# Patient Record
Sex: Female | Born: 1935 | Race: White | Hispanic: No | Smoking: Never smoker
Health system: Southern US, Community
[De-identification: ages and names within clinical notes are randomized; demographics above are authoritative.]

## PROBLEM LIST (undated history)

## (undated) DIAGNOSIS — E119 Type 2 diabetes mellitus without complications: Secondary | ICD-10-CM

## (undated) DIAGNOSIS — L84 Corns and callosities: Secondary | ICD-10-CM

## (undated) DIAGNOSIS — M1990 Unspecified osteoarthritis, unspecified site: Secondary | ICD-10-CM

## (undated) DIAGNOSIS — I10 Essential (primary) hypertension: Secondary | ICD-10-CM

## (undated) DIAGNOSIS — T7840XA Allergy, unspecified, initial encounter: Secondary | ICD-10-CM

## (undated) DIAGNOSIS — F419 Anxiety disorder, unspecified: Secondary | ICD-10-CM

## (undated) DIAGNOSIS — F32A Depression, unspecified: Secondary | ICD-10-CM

## (undated) DIAGNOSIS — E785 Hyperlipidemia, unspecified: Secondary | ICD-10-CM

## (undated) DIAGNOSIS — F329 Major depressive disorder, single episode, unspecified: Secondary | ICD-10-CM

## (undated) HISTORY — DX: Type 2 diabetes mellitus without complications: E11.9

## (undated) HISTORY — DX: Corns and callosities: L84

## (undated) HISTORY — DX: Essential (primary) hypertension: I10

## (undated) HISTORY — DX: Depression, unspecified: F32.A

## (undated) HISTORY — PX: CHOLECYSTECTOMY: SHX55

## (undated) HISTORY — DX: Major depressive disorder, single episode, unspecified: F32.9

## (undated) HISTORY — DX: Allergy, unspecified, initial encounter: T78.40XA

## (undated) HISTORY — DX: Hyperlipidemia, unspecified: E78.5

## (undated) HISTORY — PX: ABDOMINAL HYSTERECTOMY: SHX81

## (undated) HISTORY — PX: SPLENECTOMY: SUR1306

## (undated) HISTORY — DX: Anxiety disorder, unspecified: F41.9

## (undated) HISTORY — DX: Unspecified osteoarthritis, unspecified site: M19.90

---

## 1998-04-14 ENCOUNTER — Other Ambulatory Visit: Admission: RE | Admit: 1998-04-14 | Discharge: 1998-04-14 | Payer: Self-pay | Admitting: Gynecology

## 1999-04-18 ENCOUNTER — Other Ambulatory Visit: Admission: RE | Admit: 1999-04-18 | Discharge: 1999-04-18 | Payer: Self-pay | Admitting: Gynecology

## 2000-05-10 ENCOUNTER — Other Ambulatory Visit: Admission: RE | Admit: 2000-05-10 | Discharge: 2000-05-10 | Payer: Self-pay | Admitting: Gynecology

## 2001-05-12 ENCOUNTER — Other Ambulatory Visit: Admission: RE | Admit: 2001-05-12 | Discharge: 2001-05-12 | Payer: Self-pay | Admitting: Gynecology

## 2001-05-28 ENCOUNTER — Encounter: Admission: RE | Admit: 2001-05-28 | Discharge: 2001-08-26 | Payer: Self-pay | Admitting: Gynecology

## 2001-10-20 ENCOUNTER — Ambulatory Visit (HOSPITAL_COMMUNITY): Admission: RE | Admit: 2001-10-20 | Discharge: 2001-10-20 | Payer: Self-pay | Admitting: Gynecology

## 2001-10-20 ENCOUNTER — Encounter: Payer: Self-pay | Admitting: Gynecology

## 2002-01-15 ENCOUNTER — Ambulatory Visit (HOSPITAL_COMMUNITY): Admission: RE | Admit: 2002-01-15 | Discharge: 2002-01-15 | Payer: Self-pay | Admitting: Gastroenterology

## 2002-05-15 ENCOUNTER — Other Ambulatory Visit: Admission: RE | Admit: 2002-05-15 | Discharge: 2002-05-15 | Payer: Self-pay | Admitting: Gynecology

## 2003-06-11 ENCOUNTER — Other Ambulatory Visit: Admission: RE | Admit: 2003-06-11 | Discharge: 2003-06-11 | Payer: Self-pay | Admitting: Gynecology

## 2005-01-11 ENCOUNTER — Ambulatory Visit: Payer: Self-pay | Admitting: Internal Medicine

## 2005-01-15 ENCOUNTER — Ambulatory Visit: Payer: Self-pay | Admitting: Internal Medicine

## 2005-02-02 ENCOUNTER — Ambulatory Visit: Payer: Self-pay | Admitting: Internal Medicine

## 2005-05-24 ENCOUNTER — Inpatient Hospital Stay (HOSPITAL_COMMUNITY): Admission: EM | Admit: 2005-05-24 | Discharge: 2005-05-26 | Payer: Self-pay | Admitting: Emergency Medicine

## 2005-05-24 ENCOUNTER — Ambulatory Visit: Payer: Self-pay | Admitting: Internal Medicine

## 2005-05-30 ENCOUNTER — Ambulatory Visit: Payer: Self-pay | Admitting: Internal Medicine

## 2005-06-12 ENCOUNTER — Encounter: Admission: RE | Admit: 2005-06-12 | Discharge: 2005-09-10 | Payer: Self-pay | Admitting: Internal Medicine

## 2005-06-14 ENCOUNTER — Ambulatory Visit: Payer: Self-pay | Admitting: Internal Medicine

## 2005-06-26 ENCOUNTER — Other Ambulatory Visit: Admission: RE | Admit: 2005-06-26 | Discharge: 2005-06-26 | Payer: Self-pay | Admitting: Gynecology

## 2005-07-30 ENCOUNTER — Ambulatory Visit: Payer: Self-pay | Admitting: Internal Medicine

## 2005-07-30 ENCOUNTER — Encounter: Admission: RE | Admit: 2005-07-30 | Discharge: 2005-07-30 | Payer: Self-pay | Admitting: Internal Medicine

## 2005-08-07 ENCOUNTER — Encounter: Admission: RE | Admit: 2005-08-07 | Discharge: 2005-08-07 | Payer: Self-pay | Admitting: Internal Medicine

## 2005-09-18 ENCOUNTER — Encounter: Admission: RE | Admit: 2005-09-18 | Discharge: 2005-12-17 | Payer: Self-pay | Admitting: Internal Medicine

## 2005-10-29 ENCOUNTER — Ambulatory Visit: Payer: Self-pay | Admitting: Internal Medicine

## 2005-11-13 ENCOUNTER — Ambulatory Visit: Payer: Self-pay

## 2005-11-13 ENCOUNTER — Encounter: Payer: Self-pay | Admitting: Cardiology

## 2006-01-29 ENCOUNTER — Ambulatory Visit: Payer: Self-pay | Admitting: Internal Medicine

## 2006-02-12 ENCOUNTER — Encounter: Admission: RE | Admit: 2006-02-12 | Discharge: 2006-05-13 | Payer: Self-pay | Admitting: Internal Medicine

## 2006-07-22 ENCOUNTER — Ambulatory Visit: Payer: Self-pay | Admitting: Internal Medicine

## 2006-10-17 ENCOUNTER — Ambulatory Visit: Payer: Self-pay | Admitting: Internal Medicine

## 2006-10-19 IMAGING — CR DG WRIST COMPLETE 3+V*L*
4 series · 4 of 4 positions shown · non-contrast
Comparison: none

CLINICAL DATA: Low back pain.  Pain in both legs.   Pain in the left wrist.   No injury.
 LUMBAR SPINE COMPLETE ? FIVE VIEWS:
 Five views of the lumbar spine were obtained.  There is lumbar scoliosis convex to the right.  Degenerative disk disease is noted at the L2-3, L3-4 and L4-5 levels where there is loss of disk space, sclerosis and spur formation.  No acute compression deformity is seen.  The SI joints appear normal.

[view not recorded (1 of 4)]
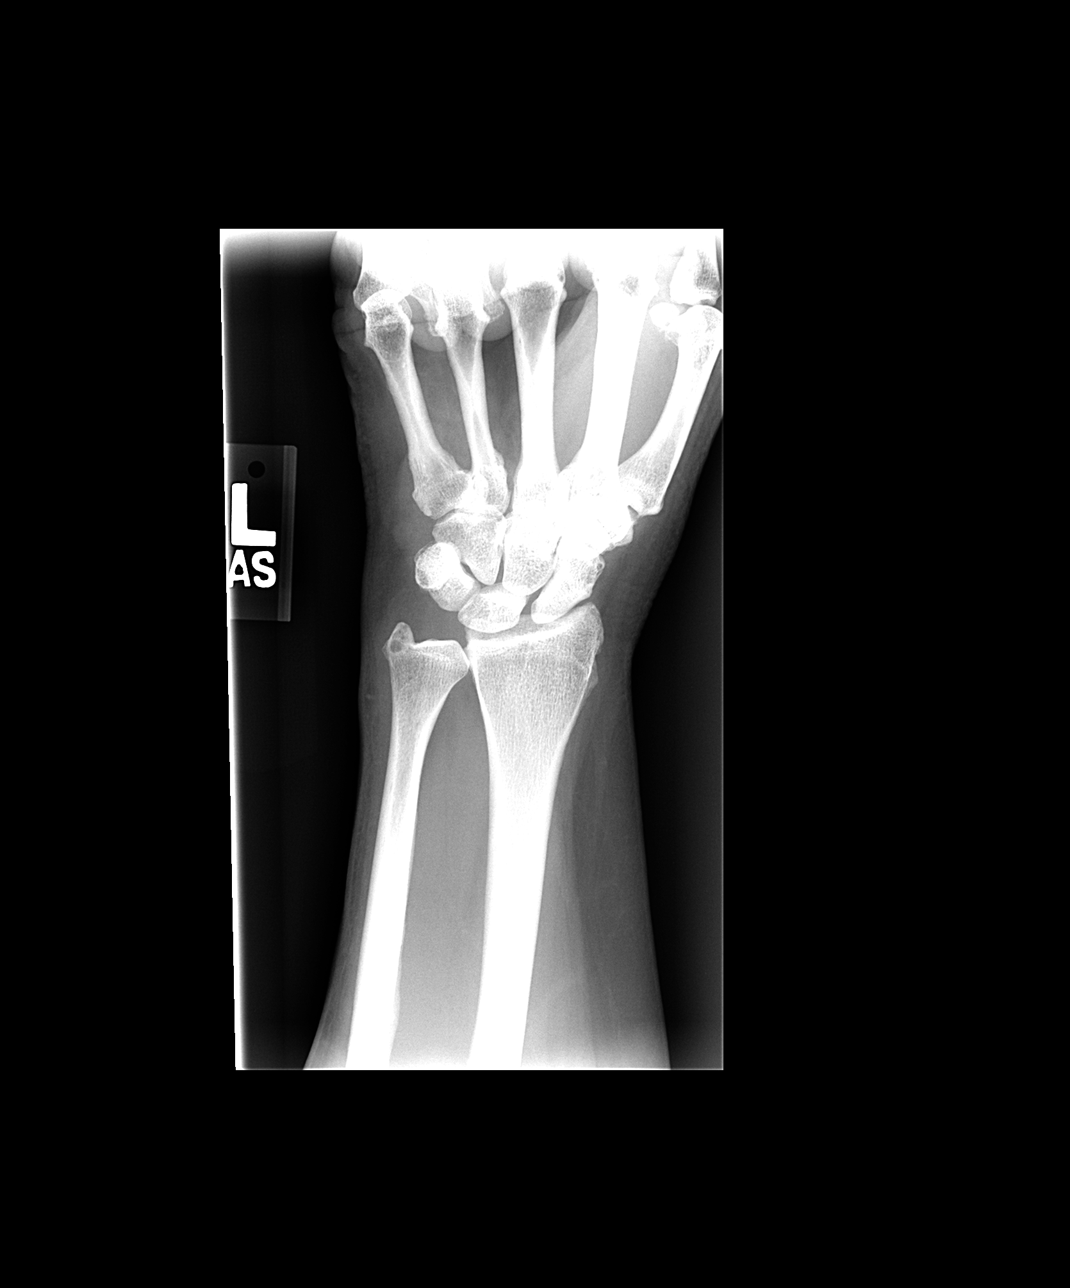

[view not recorded (2 of 4)]
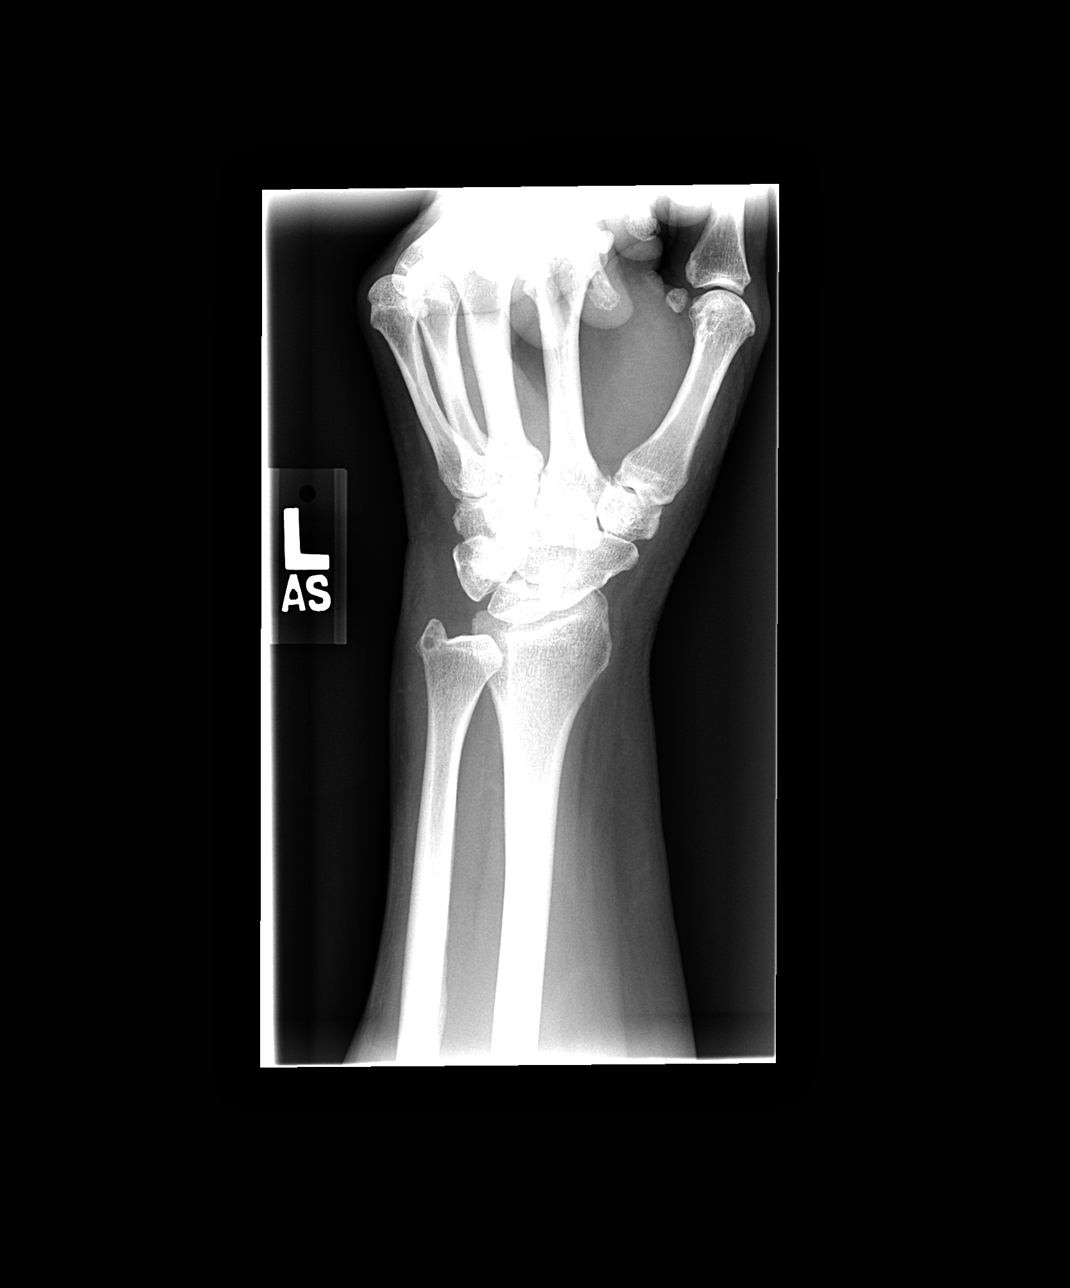

[view not recorded (3 of 4)]
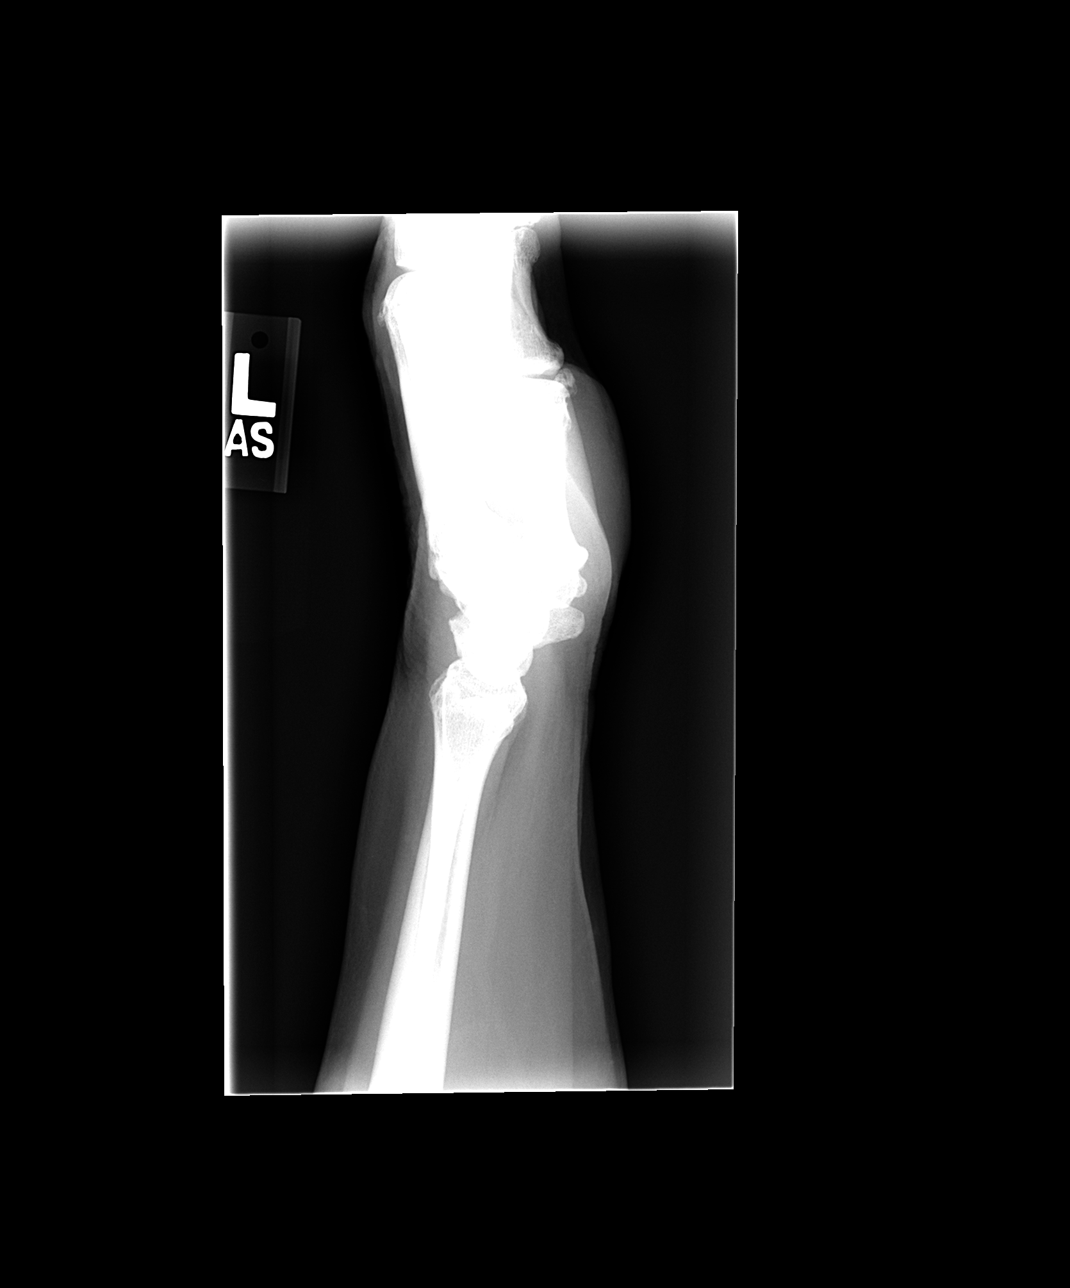

[view not recorded (4 of 4)]
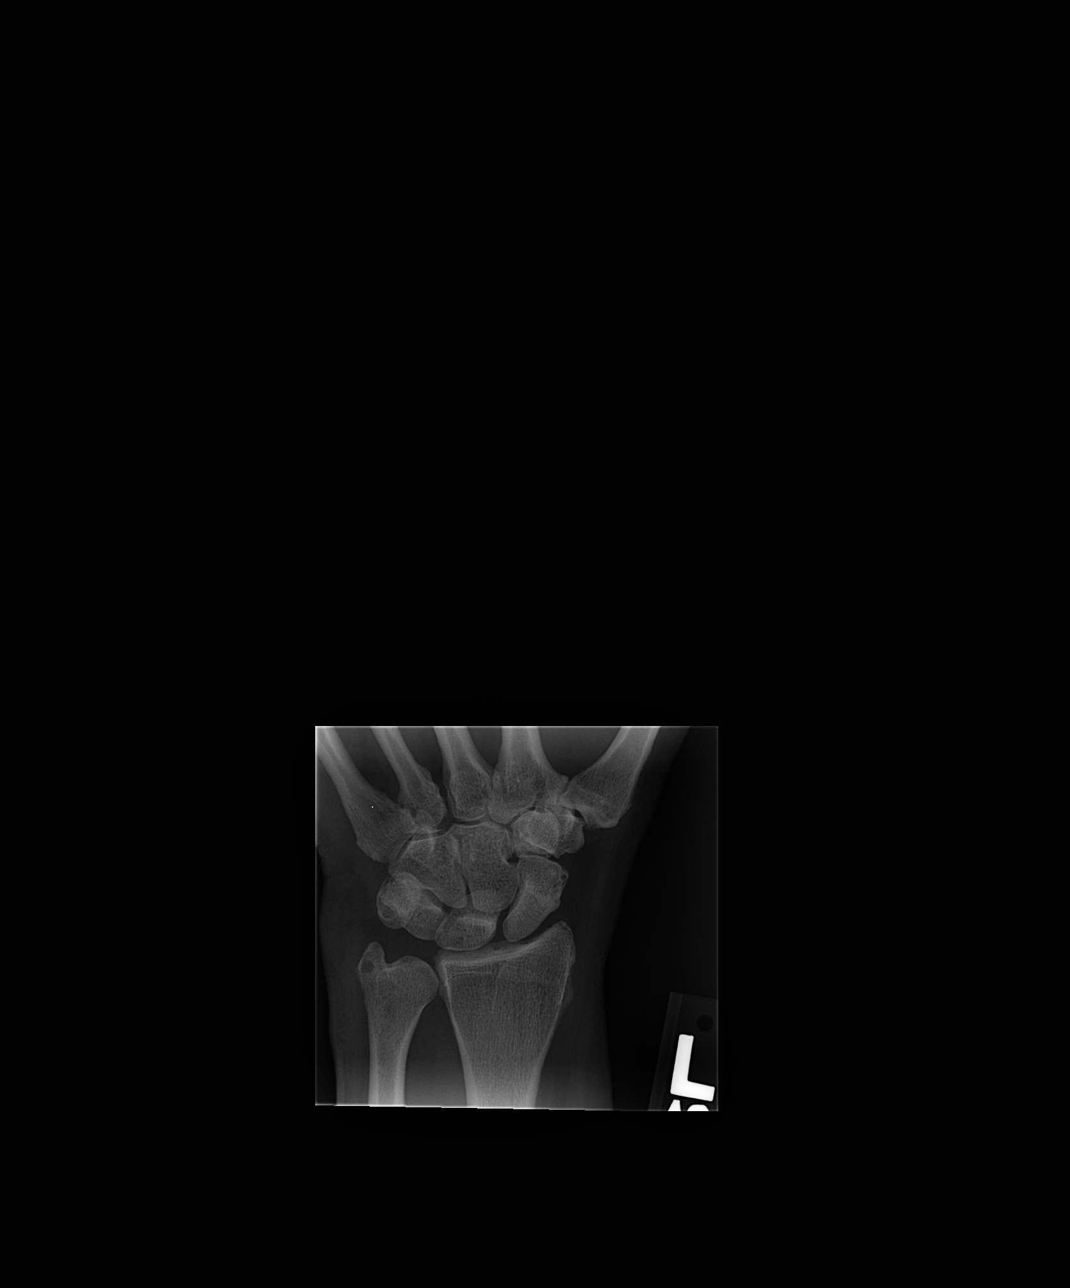

[4 of 4 positions shown; findings below may reference images not displayed]

IMPRESSION: Lumbar scoliosis convex to the right with degenerative disk disease as noted above.   No acute abnormality.
 LEFT WRIST COMPLETE ? FOUR VIEWS:
 Four views of the left wrist were obtained.  No acute abnormality is seen.  A subchondral cyst is noted in the ulnar styloid and in the distal aspect of the navicular.  Alignment is normal.
IMPRESSION: No acute abnormality.  Minimal degenerative change.

## 2007-01-31 ENCOUNTER — Ambulatory Visit: Payer: Self-pay | Admitting: Internal Medicine

## 2007-01-31 LAB — CONVERTED CEMR LAB
ALT: 20 units/L (ref 0–40)
BUN: 24 mg/dL — ABNORMAL HIGH (ref 6–23)
Cholesterol: 155 mg/dL (ref 0–200)
Creatinine, Ser: 1.1 mg/dL (ref 0.4–1.2)
Hgb A1c MFr Bld: 6 % (ref 4.6–6.0)
Microalb Creat Ratio: 5.3 mg/g (ref 0.0–30.0)
Total CHOL/HDL Ratio: 2.9

## 2007-05-19 DIAGNOSIS — L719 Rosacea, unspecified: Secondary | ICD-10-CM

## 2007-05-23 ENCOUNTER — Encounter: Payer: Self-pay | Admitting: Internal Medicine

## 2007-09-05 ENCOUNTER — Ambulatory Visit: Payer: Self-pay | Admitting: Internal Medicine

## 2007-09-05 DIAGNOSIS — E785 Hyperlipidemia, unspecified: Secondary | ICD-10-CM

## 2007-09-05 DIAGNOSIS — F32A Depression, unspecified: Secondary | ICD-10-CM | POA: Insufficient documentation

## 2007-09-05 DIAGNOSIS — E1169 Type 2 diabetes mellitus with other specified complication: Secondary | ICD-10-CM

## 2007-09-05 DIAGNOSIS — F329 Major depressive disorder, single episode, unspecified: Secondary | ICD-10-CM

## 2007-09-07 LAB — CONVERTED CEMR LAB
ALT: 20 units/L (ref 0–35)
AST: 21 units/L (ref 0–37)
BUN: 25 mg/dL — ABNORMAL HIGH (ref 6–23)
Cholesterol: 177 mg/dL (ref 0–200)
LDL Cholesterol: 98 mg/dL (ref 0–99)
Microalb, Ur: 0.2 mg/dL (ref 0.0–1.9)
Potassium: 4.8 meq/L (ref 3.5–5.1)
TSH: 1.16 microintl units/mL (ref 0.35–5.50)
Total CHOL/HDL Ratio: 3.3

## 2007-09-08 ENCOUNTER — Encounter (INDEPENDENT_AMBULATORY_CARE_PROVIDER_SITE_OTHER): Payer: Self-pay | Admitting: *Deleted

## 2007-11-24 ENCOUNTER — Encounter: Payer: Self-pay | Admitting: Internal Medicine

## 2007-12-03 ENCOUNTER — Encounter: Payer: Self-pay | Admitting: Internal Medicine

## 2008-03-19 ENCOUNTER — Ambulatory Visit: Payer: Self-pay | Admitting: Internal Medicine

## 2008-03-19 DIAGNOSIS — S43429A Sprain of unspecified rotator cuff capsule, initial encounter: Secondary | ICD-10-CM

## 2008-03-19 DIAGNOSIS — I10 Essential (primary) hypertension: Secondary | ICD-10-CM | POA: Insufficient documentation

## 2008-03-21 LAB — CONVERTED CEMR LAB
Albumin: 3.5 g/dL (ref 3.5–5.2)
Bilirubin, Direct: 0.1 mg/dL (ref 0.0–0.3)
Creatinine,U: 18.6 mg/dL
HDL: 55.3 mg/dL (ref >39.0)
Hgb A1c MFr Bld: 6.2 % — ABNORMAL HIGH (ref 4.6–6.0)
LDL Cholesterol: 67 mg/dL (ref 0–99)
Microalb, Ur: 0.4 mg/dL (ref 0.0–1.9)
Potassium: 5.3 meq/L — ABNORMAL HIGH (ref 3.5–5.1)
TSH: 1.69 microintl units/mL (ref 0.35–5.50)
Total Bilirubin: 1 mg/dL (ref 0.3–1.2)
Total Protein: 7.3 g/dL (ref 6.0–8.3)
Triglycerides: 111 mg/dL (ref 0–149)

## 2008-03-22 ENCOUNTER — Ambulatory Visit: Payer: Self-pay | Admitting: Internal Medicine

## 2008-03-22 ENCOUNTER — Encounter (INDEPENDENT_AMBULATORY_CARE_PROVIDER_SITE_OTHER): Payer: Self-pay | Admitting: *Deleted

## 2008-04-02 ENCOUNTER — Telehealth (INDEPENDENT_AMBULATORY_CARE_PROVIDER_SITE_OTHER): Payer: Self-pay | Admitting: *Deleted

## 2008-04-07 ENCOUNTER — Ambulatory Visit: Payer: Self-pay | Admitting: Internal Medicine

## 2008-04-07 ENCOUNTER — Encounter (INDEPENDENT_AMBULATORY_CARE_PROVIDER_SITE_OTHER): Payer: Self-pay | Admitting: *Deleted

## 2008-04-07 LAB — CONVERTED CEMR LAB: OCCULT 3: NEGATIVE

## 2008-05-20 ENCOUNTER — Telehealth (INDEPENDENT_AMBULATORY_CARE_PROVIDER_SITE_OTHER): Payer: Self-pay | Admitting: *Deleted

## 2008-05-20 ENCOUNTER — Encounter (INDEPENDENT_AMBULATORY_CARE_PROVIDER_SITE_OTHER): Payer: Self-pay | Admitting: *Deleted

## 2008-05-26 ENCOUNTER — Encounter: Payer: Self-pay | Admitting: Internal Medicine

## 2008-06-24 ENCOUNTER — Telehealth (INDEPENDENT_AMBULATORY_CARE_PROVIDER_SITE_OTHER): Payer: Self-pay | Admitting: *Deleted

## 2008-08-05 ENCOUNTER — Telehealth (INDEPENDENT_AMBULATORY_CARE_PROVIDER_SITE_OTHER): Payer: Self-pay | Admitting: *Deleted

## 2008-08-06 ENCOUNTER — Telehealth (INDEPENDENT_AMBULATORY_CARE_PROVIDER_SITE_OTHER): Payer: Self-pay | Admitting: *Deleted

## 2008-08-18 ENCOUNTER — Telehealth (INDEPENDENT_AMBULATORY_CARE_PROVIDER_SITE_OTHER): Payer: Self-pay | Admitting: *Deleted

## 2008-09-28 ENCOUNTER — Encounter (INDEPENDENT_AMBULATORY_CARE_PROVIDER_SITE_OTHER): Payer: Self-pay | Admitting: *Deleted

## 2008-09-30 ENCOUNTER — Encounter: Payer: Self-pay | Admitting: Internal Medicine

## 2008-11-09 ENCOUNTER — Ambulatory Visit: Payer: Self-pay | Admitting: Internal Medicine

## 2008-11-18 ENCOUNTER — Encounter (INDEPENDENT_AMBULATORY_CARE_PROVIDER_SITE_OTHER): Payer: Self-pay | Admitting: *Deleted

## 2008-11-18 LAB — CONVERTED CEMR LAB
Albumin: 3.7 g/dL (ref 3.5–5.2)
Alkaline Phosphatase: 52 units/L (ref 39–117)
Bilirubin, Direct: 0.1 mg/dL (ref 0.0–0.3)
Creatinine, Ser: 1 mg/dL (ref 0.4–1.2)
Microalb Creat Ratio: 21.4 mg/g (ref 0.0–30.0)
Total Bilirubin: 1.1 mg/dL (ref 0.3–1.2)
VLDL: 24 mg/dL (ref 0–40)

## 2008-12-13 ENCOUNTER — Encounter: Payer: Self-pay | Admitting: Internal Medicine

## 2008-12-14 ENCOUNTER — Telehealth (INDEPENDENT_AMBULATORY_CARE_PROVIDER_SITE_OTHER): Payer: Self-pay | Admitting: *Deleted

## 2009-02-17 ENCOUNTER — Telehealth (INDEPENDENT_AMBULATORY_CARE_PROVIDER_SITE_OTHER): Payer: Self-pay | Admitting: *Deleted

## 2009-03-08 ENCOUNTER — Telehealth (INDEPENDENT_AMBULATORY_CARE_PROVIDER_SITE_OTHER): Payer: Self-pay | Admitting: *Deleted

## 2009-03-10 ENCOUNTER — Ambulatory Visit: Payer: Self-pay | Admitting: Cardiovascular Disease

## 2009-03-10 ENCOUNTER — Encounter: Payer: Self-pay | Admitting: Internal Medicine

## 2009-03-11 ENCOUNTER — Encounter: Payer: Self-pay | Admitting: Internal Medicine

## 2009-03-11 ENCOUNTER — Ambulatory Visit: Payer: Self-pay | Admitting: Internal Medicine

## 2009-03-11 ENCOUNTER — Ambulatory Visit: Payer: Self-pay | Admitting: Cardiovascular Disease

## 2009-03-11 ENCOUNTER — Inpatient Hospital Stay (HOSPITAL_COMMUNITY): Admission: EM | Admit: 2009-03-11 | Discharge: 2009-03-14 | Payer: Self-pay | Admitting: Emergency Medicine

## 2009-03-18 ENCOUNTER — Ambulatory Visit: Payer: Self-pay | Admitting: Internal Medicine

## 2009-04-06 ENCOUNTER — Encounter: Admission: RE | Admit: 2009-04-06 | Discharge: 2009-04-06 | Payer: Self-pay | Admitting: Orthopedic Surgery

## 2009-04-08 ENCOUNTER — Telehealth (INDEPENDENT_AMBULATORY_CARE_PROVIDER_SITE_OTHER): Payer: Self-pay | Admitting: *Deleted

## 2009-04-15 ENCOUNTER — Telehealth (INDEPENDENT_AMBULATORY_CARE_PROVIDER_SITE_OTHER): Payer: Self-pay | Admitting: *Deleted

## 2009-04-20 ENCOUNTER — Telehealth (INDEPENDENT_AMBULATORY_CARE_PROVIDER_SITE_OTHER): Payer: Self-pay | Admitting: *Deleted

## 2009-04-22 ENCOUNTER — Telehealth (INDEPENDENT_AMBULATORY_CARE_PROVIDER_SITE_OTHER): Payer: Self-pay | Admitting: *Deleted

## 2009-04-27 ENCOUNTER — Telehealth (INDEPENDENT_AMBULATORY_CARE_PROVIDER_SITE_OTHER): Payer: Self-pay | Admitting: *Deleted

## 2009-05-04 ENCOUNTER — Ambulatory Visit: Payer: Self-pay | Admitting: Internal Medicine

## 2009-05-06 ENCOUNTER — Telehealth (INDEPENDENT_AMBULATORY_CARE_PROVIDER_SITE_OTHER): Payer: Self-pay | Admitting: *Deleted

## 2009-05-06 LAB — CONVERTED CEMR LAB: Hgb A1c MFr Bld: 6.7 % — ABNORMAL HIGH (ref 4.6–6.5)

## 2009-05-09 ENCOUNTER — Ambulatory Visit: Payer: Self-pay | Admitting: Internal Medicine

## 2009-05-10 ENCOUNTER — Encounter: Payer: Self-pay | Admitting: Internal Medicine

## 2009-05-11 ENCOUNTER — Telehealth (INDEPENDENT_AMBULATORY_CARE_PROVIDER_SITE_OTHER): Payer: Self-pay | Admitting: *Deleted

## 2009-05-16 ENCOUNTER — Encounter: Payer: Self-pay | Admitting: Internal Medicine

## 2009-06-14 ENCOUNTER — Telehealth (INDEPENDENT_AMBULATORY_CARE_PROVIDER_SITE_OTHER): Payer: Self-pay | Admitting: *Deleted

## 2009-06-14 ENCOUNTER — Encounter: Payer: Self-pay | Admitting: Internal Medicine

## 2009-06-22 ENCOUNTER — Telehealth (INDEPENDENT_AMBULATORY_CARE_PROVIDER_SITE_OTHER): Payer: Self-pay | Admitting: *Deleted

## 2009-06-27 ENCOUNTER — Telehealth (INDEPENDENT_AMBULATORY_CARE_PROVIDER_SITE_OTHER): Payer: Self-pay | Admitting: *Deleted

## 2009-07-05 ENCOUNTER — Ambulatory Visit: Payer: Self-pay | Admitting: Internal Medicine

## 2009-07-11 ENCOUNTER — Encounter (INDEPENDENT_AMBULATORY_CARE_PROVIDER_SITE_OTHER): Payer: Self-pay | Admitting: *Deleted

## 2009-07-11 LAB — CONVERTED CEMR LAB
Creatinine,U: 122.5 mg/dL
Hgb A1c MFr Bld: 6.1 % (ref 4.6–6.5)
Microalb, Ur: 0.5 mg/dL (ref 0.0–1.9)

## 2009-08-02 ENCOUNTER — Telehealth (INDEPENDENT_AMBULATORY_CARE_PROVIDER_SITE_OTHER): Payer: Self-pay | Admitting: *Deleted

## 2009-09-27 ENCOUNTER — Ambulatory Visit: Payer: Self-pay | Admitting: Internal Medicine

## 2009-10-17 ENCOUNTER — Telehealth (INDEPENDENT_AMBULATORY_CARE_PROVIDER_SITE_OTHER): Payer: Self-pay | Admitting: *Deleted

## 2009-11-07 ENCOUNTER — Telehealth: Payer: Self-pay | Admitting: Internal Medicine

## 2009-12-19 ENCOUNTER — Ambulatory Visit: Payer: Self-pay | Admitting: Internal Medicine

## 2009-12-19 DIAGNOSIS — T887XXA Unspecified adverse effect of drug or medicament, initial encounter: Secondary | ICD-10-CM | POA: Insufficient documentation

## 2009-12-27 LAB — CONVERTED CEMR LAB
ALT: 19 units/L (ref 0–35)
BUN: 23 mg/dL (ref 6–23)
Cholesterol: 146 mg/dL (ref 0–200)
Creatinine, Ser: 0.9 mg/dL (ref 0.4–1.2)
Creatinine,U: 66.8 mg/dL
LDL Cholesterol: 67 mg/dL (ref 0–99)
Microalb Creat Ratio: 9 mg/g (ref 0.0–30.0)
TSH: 1.13 microintl units/mL (ref 0.35–5.50)
Total Bilirubin: 0.9 mg/dL (ref 0.3–1.2)
Triglycerides: 111 mg/dL (ref 0.0–149.0)
VLDL: 22.2 mg/dL (ref 0.0–40.0)

## 2010-01-04 ENCOUNTER — Ambulatory Visit: Payer: Self-pay | Admitting: Gynecology

## 2010-01-04 ENCOUNTER — Other Ambulatory Visit: Admission: RE | Admit: 2010-01-04 | Discharge: 2010-01-04 | Payer: Self-pay | Admitting: Gynecology

## 2010-02-06 ENCOUNTER — Encounter: Payer: Self-pay | Admitting: Internal Medicine

## 2010-02-22 ENCOUNTER — Telehealth (INDEPENDENT_AMBULATORY_CARE_PROVIDER_SITE_OTHER): Payer: Self-pay | Admitting: *Deleted

## 2010-03-13 ENCOUNTER — Telehealth (INDEPENDENT_AMBULATORY_CARE_PROVIDER_SITE_OTHER): Payer: Self-pay | Admitting: *Deleted

## 2010-05-01 ENCOUNTER — Telehealth (INDEPENDENT_AMBULATORY_CARE_PROVIDER_SITE_OTHER): Payer: Self-pay | Admitting: *Deleted

## 2010-06-08 ENCOUNTER — Telehealth (INDEPENDENT_AMBULATORY_CARE_PROVIDER_SITE_OTHER): Payer: Self-pay | Admitting: *Deleted

## 2010-08-03 ENCOUNTER — Ambulatory Visit: Payer: Self-pay | Admitting: Internal Medicine

## 2010-08-03 ENCOUNTER — Telehealth (INDEPENDENT_AMBULATORY_CARE_PROVIDER_SITE_OTHER): Payer: Self-pay | Admitting: *Deleted

## 2010-08-04 ENCOUNTER — Telehealth (INDEPENDENT_AMBULATORY_CARE_PROVIDER_SITE_OTHER): Payer: Self-pay | Admitting: *Deleted

## 2010-08-04 LAB — CONVERTED CEMR LAB: Hgb A1c MFr Bld: 6.2 % (ref 4.6–6.5)

## 2010-09-15 ENCOUNTER — Telehealth (INDEPENDENT_AMBULATORY_CARE_PROVIDER_SITE_OTHER): Payer: Self-pay | Admitting: *Deleted

## 2010-09-15 ENCOUNTER — Ambulatory Visit: Payer: Self-pay | Admitting: Internal Medicine

## 2010-10-13 ENCOUNTER — Telehealth (INDEPENDENT_AMBULATORY_CARE_PROVIDER_SITE_OTHER): Payer: Self-pay | Admitting: *Deleted

## 2010-10-16 ENCOUNTER — Ambulatory Visit: Payer: Self-pay | Admitting: Internal Medicine

## 2010-10-17 ENCOUNTER — Ambulatory Visit: Payer: Self-pay | Admitting: Internal Medicine

## 2010-10-17 DIAGNOSIS — E119 Type 2 diabetes mellitus without complications: Secondary | ICD-10-CM | POA: Insufficient documentation

## 2010-10-17 DIAGNOSIS — E1151 Type 2 diabetes mellitus with diabetic peripheral angiopathy without gangrene: Secondary | ICD-10-CM | POA: Insufficient documentation

## 2010-10-17 DIAGNOSIS — E1165 Type 2 diabetes mellitus with hyperglycemia: Secondary | ICD-10-CM

## 2010-10-17 DIAGNOSIS — E1169 Type 2 diabetes mellitus with other specified complication: Secondary | ICD-10-CM | POA: Insufficient documentation

## 2010-11-09 ENCOUNTER — Telehealth (INDEPENDENT_AMBULATORY_CARE_PROVIDER_SITE_OTHER): Payer: Self-pay | Admitting: *Deleted

## 2010-12-11 ENCOUNTER — Telehealth (INDEPENDENT_AMBULATORY_CARE_PROVIDER_SITE_OTHER): Payer: Self-pay | Admitting: *Deleted

## 2010-12-18 ENCOUNTER — Telehealth (INDEPENDENT_AMBULATORY_CARE_PROVIDER_SITE_OTHER): Payer: Self-pay | Admitting: *Deleted

## 2011-01-09 ENCOUNTER — Other Ambulatory Visit: Payer: Self-pay | Admitting: Internal Medicine

## 2011-01-09 ENCOUNTER — Ambulatory Visit
Admission: RE | Admit: 2011-01-09 | Discharge: 2011-01-09 | Payer: Self-pay | Source: Home / Self Care | Attending: Internal Medicine | Admitting: Internal Medicine

## 2011-01-09 LAB — HEMOGLOBIN: Hemoglobin: 14.4 g/dL (ref 12.0–15.0)

## 2011-01-09 LAB — POTASSIUM: Potassium: 5.9 mEq/L — ABNORMAL HIGH (ref 3.5–5.1)

## 2011-01-09 LAB — HEPATIC FUNCTION PANEL
ALT: 20 U/L (ref 0–35)
AST: 26 U/L (ref 0–37)
Albumin: 3.7 g/dL (ref 3.5–5.2)
Alkaline Phosphatase: 55 U/L (ref 39–117)
Bilirubin, Direct: 0.1 mg/dL (ref 0.0–0.3)
Total Bilirubin: 0.9 mg/dL (ref 0.3–1.2)
Total Protein: 6.6 g/dL (ref 6.0–8.3)

## 2011-01-09 LAB — LIPID PANEL
Cholesterol: 131 mg/dL (ref 0–200)
HDL: 51.1 mg/dL (ref >39.00)
LDL Cholesterol: 49 mg/dL (ref 0–99)
Total CHOL/HDL Ratio: 3
Triglycerides: 154 mg/dL — ABNORMAL HIGH (ref 0.0–149.0)
VLDL: 30.8 mg/dL (ref 0.0–40.0)

## 2011-01-09 LAB — BUN: BUN: 26 mg/dL — ABNORMAL HIGH (ref 6–23)

## 2011-01-09 LAB — CREATININE, SERUM: Creatinine, Ser: 1.1 mg/dL (ref 0.4–1.2)

## 2011-01-16 ENCOUNTER — Encounter: Payer: Self-pay | Admitting: Internal Medicine

## 2011-01-16 ENCOUNTER — Other Ambulatory Visit: Payer: Self-pay | Admitting: Internal Medicine

## 2011-01-16 ENCOUNTER — Ambulatory Visit
Admission: RE | Admit: 2011-01-16 | Discharge: 2011-01-16 | Payer: Self-pay | Source: Home / Self Care | Attending: Internal Medicine | Admitting: Internal Medicine

## 2011-01-16 LAB — MICROALBUMIN / CREATININE URINE RATIO
Creatinine,U: 295.4 mg/dL
Microalb Creat Ratio: 1.4 mg/g (ref 0.0–30.0)
Microalb, Ur: 4.1 mg/dL — ABNORMAL HIGH (ref 0.0–1.9)

## 2011-01-16 LAB — HEMOGLOBIN A1C: Hgb A1c MFr Bld: 6.3 % (ref 4.6–6.5)

## 2011-01-19 ENCOUNTER — Telehealth (INDEPENDENT_AMBULATORY_CARE_PROVIDER_SITE_OTHER): Payer: Self-pay | Admitting: *Deleted

## 2011-02-01 NOTE — Progress Notes (Signed)
Summary: 3 prescriptions--[facility]  Phone Note Refill Request Message from:  Patient on December 11, 2010 12:56 PM  Refills Requested: Medication #1:  LOSARTAN POTASSIUM 100 MG TABS 1 by mouth once daily  Medication #2:  ONETOUCH ULTRA TEST   STRP Use as directed once daily  Medication #3:  LIPITOR 20 MG  TABS 1 by mouth at bedtime [facility]--patient brought in paper copies of paperwork to [facility] meds---will take to [facility] in blue plastic folder  Next Appointment Scheduled: lab=01/09/2011; Dr Hopper=01/16/2011 Initial call taken by: Berneta Sages,  December 11, 2010 1:44 PM  Follow-up for Phone Call        Rx's faxed to [phone]. I also contacted patient to in form her labs due, order added to pending lab appointment in Jan 2012 Follow-up by: Georgette Dover CMA,  December 12, 2010 9:03 AM    Prescriptions: Donald Siva TEST   STRP (GLUCOSE BLOOD) Use as directed once daily  #100 x 1   Entered by:   Georgette Dover CMA   Authorized by:   Unice Cobble MD   Signed by:   Georgette Dover CMA on 12/12/2010   Method used:   Print then Give to Patient   RxID:   [id number] LOSARTAN POTASSIUM 100 MG TABS (LOSARTAN POTASSIUM) 1 by mouth once daily  #90 x 0   Entered by:   Georgette Dover CMA   Authorized by:   Unice Cobble MD   Signed by:   Georgette Dover CMA on 12/12/2010   Method used:   Print then Give to Patient   RxID:   [id number] LIPITOR 20 MG  TABS (ATORVASTATIN CALCIUM) 1 by mouth at bedtime  #90 x 0   Entered by:   Georgette Dover CMA   Authorized by:   Unice Cobble MD   Signed by:   Georgette Dover CMA on 12/12/2010   Method used:   Print then Give to Patient   RxID:   [id number]

## 2011-02-01 NOTE — Progress Notes (Signed)
Summary: NEEDS 4 PRESCRIPTIONS FAXED TO [facility]  Phone Note Call from Patient Call back at Home Phone [phone]   Caller: Patient Summary of Call: PATIENT BROUGHT IN ****[address] [place] REFILLS **** FOR [facility] FORMS FOR:  1)  GLIMEPIRIDE 2 MG   TAKE ONE TABLET BY MOUTH ONCE DAILY   = 90 PILLS  2)  SERTRALINE 100 MG        TAKE ONE TABLET ONCE DAILY = 90 PILLS  3)  FUROSEMIDE 40 MG       TAKE ONE TABLET BY MOUTH ONCE DAILY  =  90 PILLS  4)   METOPROLOL TARTRATE 100 MG    TAKE ONE TABLET TWICE DAILY    =   180 PILLS  WILL TAKE TO [facility] IN PLASTIC SLEEVE--PLEASE CALL PATIENT TO TELL HER THESE 4 PRESCRIPTIONS HAVE BEEN FAXED  PATIENT HAD 30 MIN APPOINTMENT ON 12/19/2009 Initial call taken by: Berneta Sages,  May 01, 2010 12:20 PM  Follow-up for Phone Call        RX's faxed to [phone], Left message on VM informing patient rx's faxed.  Cozaar Rx was mailed to patient, per patient request (she can get it cheaper somewhere other thatn [facility]) Follow-up by: Georgette Dover,  May 01, 2010 4:20 PM    Prescriptions: COZAAR 100 MG TABS (LOSARTAN POTASSIUM) 1 by mouth once daily, patient had reaction to Generic, needs BMN Brand medically necessary #90 x 1   Entered by:   Georgette Dover   Authorized by:   Unice Cobble MD   Signed by:   Georgette Dover on 05/01/2010   Method used:   Print then Give to Patient   RxID:   [id number] SERTRALINE HCL 100 MG TABS (SERTRALINE HCL) 1 once daily  #90 x 1   Entered by:   Georgette Dover   Authorized by:   Unice Cobble MD   Signed by:   Georgette Dover on 05/01/2010   Method used:   Print then Give to Patient   RxID:   [id number] GLIMEPIRIDE 2 MG  TABS (GLIMEPIRIDE) 1/2 once daily  #90 x 1   Entered by:   Georgette Dover   Authorized by:   Unice Cobble MD   Signed by:   Georgette Dover on 05/01/2010   Method used:   Print then Give to Patient   RxID:   [id number] FUROSEMIDE 40 MG  TABS (FUROSEMIDE)  1 by mouth once daily  #90 x 1   Entered by:   Georgette Dover   Authorized by:   Unice Cobble MD   Signed by:   Georgette Dover on 05/01/2010   Method used:   Print then Give to Patient   RxID:   [id number] METOPROLOL TARTRATE 100 MG  TABS (METOPROLOL TARTRATE) 1 bid  #180 x 1   Entered by:   Georgette Dover   Authorized by:   Unice Cobble MD   Signed by:   Georgette Dover on 05/01/2010   Method used:   Print then Give to Patient   RxID:   [id number]

## 2011-02-01 NOTE — Assessment & Plan Note (Signed)
Summary: FLU SHOT///[facility]  Nurse Visit   Allergies: 1)  ! Pcn 2)  ! Celexa 3)  ! Wellbutrin 4)  ! Norvasc  Orders Added: 1)  Flu Vaccine 57yrs + MEDICARE PATIENTS [Q2039] 2)  Administration Flu vaccine - MCR [id number] Flu Vaccine Consent Questions     Do you have a history of severe allergic reactions to this vaccine? no    Any prior history of allergic reactions to egg and/or gelatin? no    Do you have a sensitivity to the preservative Thimersol? no    Do you have a past history of Guillan-Barre Syndrome? no    Do you currently have an acute febrile illness? no    Have you ever had a severe reaction to latex? no    Vaccine information given and explained to patient? yes    Are you currently pregnant? no    Lot Number:[id number]   Exp Date:06/30/2011   Site Given  Left Deltoid IMistration Flu vaccine - MCR [id number] .lbmedflu

## 2011-02-01 NOTE — Progress Notes (Signed)
Summary: Losartan Drug interaction --note from [facility]  Phone Note Refill Request Message from:  Fax from Pharmacy on December 18, 2010 9:36 AM  Refills Requested: Medication #1:  LOSARTAN POTASSIUM 100 MG TABS 1 by mouth once daily [facility]-----*****NOTE---Drug Interaction Fax---Clarification is required to dispense this medication.  Our records indicate that this patient has an allergy to [facility].  This may pose the risk of a specific allergen group allergic reaction, and we have no history of the patient taking this medication.*****    Will send this paperwork to [place] in blue plastic sleeve  Initial call taken by: Berneta Sages,  December 18, 2010 9:40 AM  Follow-up for Phone Call        I spoke with patient and she indicated she is not allergic to Losartan and tried to explain that [facility]   I called [facility] at [phone], ext: [phone] and spoke with the pharmacist and indicated that med not on allergic list and ok to fill. Follow-up by: Georgette Dover CMA,  December 19, 2010 8:57 AM

## 2011-02-01 NOTE — Letter (Signed)
Summary: [place]   Imported By: Edmonia James 02/13/2010 13:48:05  _____________________________________________________________________  External Attachment:    Type:   Image     Comment:   External Document

## 2011-02-01 NOTE — Progress Notes (Signed)
Summary: REFILL REQUEST  Phone Note Refill Request Message from:  Patient on October 13, 2010 10:27 AM  Refills Requested: Medication #1:  ONETOUCH ULTRA TEST   STRP Use as directed once daily   Dosage confirmed as above?Dosage Confirmed   Supply Requested: 3 months  Medication #2:  LIPITOR 20 MG  TABS 1 by mouth at bedtime   Dosage confirmed as above?Dosage Confirmed   Supply Requested: 3 months [facility]  Next Appointment Scheduled: NONE Initial call taken by: Osborn Coho,  October 13, 2010 10:31 AM    Prescriptions: ONETOUCH ULTRA TEST   STRP (GLUCOSE BLOOD) Use as directed once daily  #100 x 0   Entered by:   [place] by:   Unice Cobble MD   Signed by:   Georgette Dover CMA on 10/13/2010   Method used:   Faxed to ...       [facility] ([facility])             , [place]         Ph: [phone]       Fax: [phone]   RxID:   [id number] LIPITOR 20 MG  TABS (ATORVASTATIN CALCIUM) 1 by mouth at bedtime  #90 x 0   Entered by:   Georgette Dover CMA   Authorized by:   Unice Cobble MD   Signed by:   Georgette Dover CMA on 10/13/2010   Method used:   Faxed to ...       [facility] (mail[facility])             , [place]         Ph: [phone]       Fax: [phone]   RxID:   [id number]

## 2011-02-01 NOTE — Progress Notes (Signed)
Summary: REFILL  Phone Note Call from Patient   Reason for Call: Refill Medication Summary of Call: PATIENT WALK IN LEAVING A PAPER FOR A REFILL AND GENERIC ON HER LIPITOR.  ONE TOUCH ULTRA STRIP 100'S WITH 3 REFILL.  Initial call taken by: Velora Heckler,  June 08, 2010 11:55 AM  Follow-up for Phone Call        Left message on machine for patient to return call when avaliable, Reason for call:   1.) Lipitor going Generic in 10/2010 2.) Last OV 11/2010, will fill meds until then Follow-up by: Georgette Dover,  June 09, 2010 10:58 AM  Additional Follow-up for Phone Call Additional follow up Details #1::        discussed with pt. Pooler  June 09, 2010 4:09 PM     Prescriptions: ONETOUCH ULTRA TEST   STRP (GLUCOSE BLOOD) Use as directed once daily  #100 x 1   Entered by:   Allyn Kenner CMA   Authorized by:   Unice Cobble MD   Signed by:   Allyn Kenner CMA on 06/09/2010   Method used:   Faxed to ...       [facility]) (mail-order)             ,          Ph: [phone]       Fax: [phone]   RxID:   [id number] LIPITOR 20 MG  TABS (ATORVASTATIN CALCIUM) 1 by mouth at bedtime  #90 x 1   Entered by:   Allyn Kenner CMA   Authorized by:   Unice Cobble MD   Signed by:   Allyn Kenner CMA on 06/09/2010   Method used:   Faxed to ...       [facility]) (mail-order)             ,          Ph: [phone]       Fax: [phone]   RxID:   [id number]

## 2011-02-01 NOTE — Progress Notes (Signed)
Summary: REFILL REQUEST  Phone Note Refill Request Message from:  Patient on November 09, 2010 9:24 AM  Refills Requested: Medication #1:  METOPROLOL TARTRATE 100 MG  TABS 1 bid   Dosage confirmed as above?Dosage Confirmed   Supply Requested: 3 months  Medication #2:  FUROSEMIDE 40 MG  TABS 1 by mouth once daily   Dosage confirmed as above?Dosage Confirmed   Supply Requested: 3 months  Medication #3:  SERTRALINE HCL 100 MG TABS 1 once daily   Dosage confirmed as above?Dosage Confirmed   Supply Requested: 3 months [facility]  Next Appointment Scheduled: 01/09/11 Initial call taken by: Osborn Coho,  November 09, 2010 9:30 AM    Prescriptions: SERTRALINE HCL 100 MG TABS (SERTRALINE HCL) 1 once daily  #90 x 1   Entered by:   [place] by:   Unice Cobble MD   Signed by:   Georgette Dover CMA on 11/09/2010   Method used:   Electronically to        [facility]* (mail-order)             , [place]         Ph: [phone]       Fax: [phone]   [id number] FUROSEMIDE 40 MG  TABS (FUROSEMIDE) 1 by mouth once daily  #90 x 1   Entered by:   Georgette Dover CMA   Authorized by:   Unice Cobble MD   Signed by:   Georgette Dover CMA on 11/09/2010   Method used:   Electronically to        [facility] (mail[facility]order)             , [place]         Ph: [phone]       Fax: [phone]   RxID:   [id number] METOPROLOL TARTRATE 100 MG  TABS (METOPROLOL TARTRATE) 1 bid  #180 x 1   Entered by:   Georgette Dover CMA   Authorized by:   Unice Cobble MD   Signed by:   Georgette Dover CMA on 11/09/2010   Method used:   Electronically to        [facility] (mail-order)             , [place]         Ph: [phone]       Fax: [phone]   [id number]   Appended Document: REFILL REQUEST Prescriptions: SERTRALINE HCL 100 MG TABS (SERTRALINE HCL) 1 once daily  #90 x 1   Entered by:   Georgette Dover CMA   Authorized by:   Unice Cobble MD   Signed  by:   Georgette Dover CMA on 11/09/2010   Method used:   Faxed to ...       [facility] Fax) (mail-order)             ,          Ph: [phone]       Fax: [phone]   [id number] FUROSEMIDE 40 MG  TABS (FUROSEMIDE) 1 by mouth once daily  #90 x 1   Entered by:   Georgette Dover CMA   Authorized by:   Unice Cobble MD   Signed by:   Georgette Dover CMA on 11/09/2010   Method used:   Faxed to ...       [facility] Delivery Fax) Probation officer)             ,  Ph: [phone]       Fax: [phone]   [id number] METOPROLOL TARTRATE 100 MG  TABS (METOPROLOL TARTRATE) 1 bid  #180 x 1   Entered by:   Georgette Dover CMA   Authorized by:   Unice Cobble MD   Signed by:   Georgette Dover CMA on 11/09/2010   Method used:   Faxed to ...       [facility] F[facility]) (mail-order)             ,          Ph: [phone]       Fax: [phone]   RxID:   [id number]  Faxed rx's, message came through (flag)-error, did not go through./[facility] CMA  November 09, 2010 3:34 PM

## 2011-02-01 NOTE — Progress Notes (Signed)
Summary: Blood Sugar  Phone Note Call from Patient   Caller: Patient Summary of Call: Pt dropped off a note along with two refill request. The note states " Also, my blood sugar is too high. The average fasting for the past two months is 169.8. I am only taking the Metformin as prescribed." Thanks JT.        Please advise Initial call taken by: Osborn Coho,  October 13, 2010 10:37 AM  Follow-up for Phone Call        Per Dr.Hopper patient needs a1c and f/u appointment  I spoke with patient and schedule both Follow-up by: Georgette Dover CMA,  October 13, 2010 5:04 PM

## 2011-02-01 NOTE — Progress Notes (Signed)
Summary: RX  Phone Note Call from Patient Call back at [facility] Phone [phone] Call back at [facility]--[phone]   Caller: Patient Summary of Call: PATIENT CAME IN A DROP OFF A NOTE AND REFILL SHEET:  TO DR. HOPPER NURSE--PLEASE FAX A PRESCRIPTION FOR METFORMIN TO THE ATTACHED. THERE  (A RECORDING) KEEP CALLING SAYING THEY WILL NOT RENEW UNTIL THEY RECIEVE A PRESCRIPTION . THANKS Lillian[place] [phone] OR [facility] [id number] Initial call taken by: Velora Heckler,  March 13, 2010 10:11 AM  Follow-up for Phone Call        RX refaxed to [phone], note made on RX that this was faxed in Feb, 2011  Left message on machine informing patient, rx refaxed. Follow-up by: Georgette Dover,  March 13, 2010 10:36 AM    Prescriptions: METFORMIN HCL 850 MG TABS (METFORMIN HCL) 1 two times a day with 2 largest meals  #180 x 1   Entered by:   Georgette Dover   Authorized by:   Unice Cobble MD   Signed by:   Georgette Dover on 03/13/2010   Method used:   Print then Give to Patient   RxID:   [id number]

## 2011-02-01 NOTE — Assessment & Plan Note (Signed)
Summary: 3 MONTH FOLLOWUP///SPH   Vital Signs:  Patient profile:   77 year old female Weight:      261.6 pounds Pulse rate:   64 / minute Resp:     14 per minute BP sitting:   118 / 72  (left arm) Cuff size:   large  Vitals Entered By: [place] (January 16, 2011 8:08 AM) CC: 3 Month follow-up, copy of labs given, Type 2 diabetes mellitus follow-up   Primary Care Provider:  Unice Cobble  CC:  3 Month follow-up, copy of labs given, and Type 2 diabetes mellitus follow-up.  History of Present Illness:      This is a 77 year old woman who presents for Hyperlipidemia follow-up.  The patient reports occasional  diarrhea ( better off fish oil), but denies muscle aches, GI upset, abdominal pain, flushing, itching, constipation, and fatigue.  Other symptoms include  occasional pedal edema.  The patient denies the following symptoms: chest pain/pressure, exercise intolerance, dypsnea, palpitations, and syncope.  Compliance with medications (by patient report) has been near 100%.  Dietary compliance has been fair.  The patient reports exercising occasionally.  Adjunctive measures currently used by the patient include fiber and ASA.   LDL 49, HDL . Type 2 Diabetes Mellitus Follow-Up      The patient is also here for Type 2 diabetes mellitus follow-up.  The patient reports weight loss of > 40 # and numbness of extremities, but denies polyuria, polydipsia, blurred vision, and hypoglycemia requiring help.  The patient denies the following symptoms: vomiting, orthostatic symptoms, poor wound healing, intermittent claudication, vision loss, and foot ulcer.  The patient has been measuring capillary blood glucose before breakfast, average 152.  Since the last visit, the patient reports having had no eye care. Podiatrist seen 01/16. A1c pending.     Hypertension Follow-Up:  The patient also presents for Hypertension follow-up.  The patient denies urinary frequency and headaches.  Compliance with  medications (by patient report) has been near 100%.  Adjunctive measures currently used by the patient include salt restriction.   BP < 135/85 @ home.  Current Medications (verified): 1)  Lipitor 20 Mg  Tabs (Atorvastatin Calcium) .Marland Kitchen.. 1 By Mouth At Bedtime 2)  Metoprolol Tartrate 100 Mg  Tabs (Metoprolol Tartrate) .Marland Kitchen.. 1 Bid 3)  Furosemide 40 Mg  Tabs (Furosemide) .Marland Kitchen.. 1 By Mouth Once Daily 4)  Losartan Potassium 100 Mg Tabs (Losartan Potassium) .Marland Kitchen.. 1 By Mouth Once Daily 5)  Shark Cartiliage 750mg  2 Tabs Bid 6)  Aspirin 81 Mg Tabs (Aspirin) .... 3 By Mouth Once Daily 7)  Multivitamin 1 Po Bid 8)  Tylenol Arthritis .... 2 Tabs Qpm 9)  Onetouch Ultra Test   Strp (Glucose Blood) .... Use As Directed Once Daily 10)  Onetouch Ultrasoft Lancets   Misc (Lancets) .... Use As Directed Once Daily 11)  Bilberry 470mg  .... 1 By Mouth Two Times A Day 12)  Sertraline Hcl 100 Mg Tabs (Sertraline Hcl) .Marland Kitchen.. 1 Once Daily 13)  Hair Vit .Marland Kitchen.. 1 By Mouth Once Daily 14)  Minocycline Hcl 100 Mg Tabs (Minocycline Hcl) .Marland Kitchen.. 1 By Mouth Two Times A Day When Flare Up Occurs Otherwise 1 By Mouth Every Other Day 15)  Janumet 50-1000 Mg Tabs (Sitagliptin-Metformin Hcl) .Marland Kitchen.. 1 Two Times A Day With Meals  Allergies: 1)  ! Pcn 2)  ! Celexa 3)  ! Wellbutrin 4)  ! Norvasc  Physical Exam  General:  in no acute distress; alert,appropriate and cooperative  throughout examination;overweight-appearing.   Lungs:  Normal respiratory effort, chest expands symmetrically. Lungs are clear to auscultation, no crackles or wheezes. Heart:  normal rate, regular rhythm, no gallop, no rub, no JVD, and grade 1 /6 systolic murmur.   Pulses:  R and L carotid,radial ulses are full and equal bilaterally. Pedal pulses decreased Extremities:  Lipidema bilaterlly;good nail health Neurologic:  alert & oriented X3, sensation intact to light touch over feet, and DTRs symmetrical and normal.   Skin:  Intact without suspicious lesions or  rashes Psych:  memory intact for recent and remote, normally interactive, and good eye contact.     Impression & Recommendations:  Problem # 1:  HYPERLIPIDEMIA NEC/NOS (ICD-272.4)  Her updated medication list for this problem includes:    Lipitor 20 Mg Tabs (Atorvastatin calcium) .Marland Kitchen... 1/2 once daily  Problem # 2:  HYPERTENSION, ESSENTIAL NOS (ICD-401.9) controlled Her updated medication list for this problem includes:    Metoprolol Tartrate 100 Mg Tabs (Metoprolol tartrate) .Marland Kitchen... 1 bid    Furosemide 40 Mg Tabs (Furosemide) .Marland Kitchen... 1 by mouth once daily    Losartan Potassium 100 Mg Tabs (Losartan potassium) .Marland Kitchen... 1 by mouth once daily  Problem # 3:  DIABETES MELLITUS, UNCONTROLLED (ICD-250.02)  Her updated medication list for this problem includes:    Losartan Potassium 100 Mg Tabs (Losartan potassium) .Marland Kitchen... 1 by mouth once daily    Aspirin 81 Mg Tabs (Aspirin) .Marland KitchenMarland KitchenMarland KitchenMarland Kitchen 3 by mouth once daily    Janumet 50-1000 Mg Tabs (Sitagliptin-metformin hcl) .Marland Kitchen... 1 two times a day with meals  Orders: Venipuncture [id number]) Prescription Created Electronically [phone]) Specimen Handling (99000) TLB-A1C / Hgb A1C (Glycohemoglobin) (83036-A1C) TLB-Microalbumin/Creat Ratio, Urine (82043-MALB)  Complete Medication List: 1)  Lipitor 20 Mg Tabs (Atorvastatin calcium) .... 1/2 once daily 2)  Metoprolol Tartrate 100 Mg Tabs (Metoprolol tartrate) .Marland Kitchen.. 1 bid 3)  Furosemide 40 Mg Tabs (Furosemide) .Marland Kitchen.. 1 by mouth once daily 4)  Losartan Potassium 100 Mg Tabs (Losartan potassium) .Marland Kitchen.. 1 by mouth once daily 5)  Shark Cartiliage 750mg  2 Tabs Bid  6)  Aspirin 81 Mg Tabs (Aspirin) .... 3 by mouth once daily 7)  Multivitamin 1 Po Bid  8)  Tylenol Arthritis  .... 2 tabs qpm 9)  Onetouch Ultra Test Strp (Glucose blood) .... Use as directed once daily 10)  Onetouch Ultrasoft Lancets Misc (Lancets) .... Use as directed once daily 11)  Bilberry 470mg   .... 1 by mouth two times a day 12)  Sertraline Hcl 100 Mg Tabs  (Sertraline hcl) .Marland Kitchen.. 1 once daily 13)  Hair Vit  .Marland KitchenMarland Kitchen. 1 by mouth once daily 14)  Minocycline Hcl 100 Mg Tabs (Minocycline hcl) .Marland Kitchen.. 1 by mouth two times a day when flare up occurs otherwise 1 by mouth every other day 15)  Janumet 50-1000 Mg Tabs (Sitagliptin-metformin hcl) .Marland Kitchen.. 1 two times a day with meals  Patient Instructions: 1)  Check your blood  pressure regularly.Your goal = 135/85 ON AVERAGE . 2)  See your eye doctor yearly to check for diabetic eye damage. 3)  Check your feet each night for sore areas, calluses or signs of infection. 4)  Check your  sugar  regularly. Your goal = 90-150 & < 180 two hrs after largest meal. Prescriptions: ONETOUCH ULTRA TEST   STRP (GLUCOSE BLOOD) Use as directed once daily  #100 x 3   Entered and Authorized by:   Unice Cobble MD   Signed by:   Heath Lark on 01/16/2011   Method used:  Printed then faxed to ...       [facility])       [address], [place]  [id number]       Ph: [phone]       Fax: [phone]   RxID:   [id number]    Orders Added: 1)  Est. Patient Level IV [id number] 2)  Venipuncture [id number] 3)  Prescription Created Electronically [id number] 4)  Specimen Handling [99000] 5)  TLB-A1C / Hgb A1C (Glycohemoglobin) [83036-A1C] 6)  TLB-Microalbumin/Creat Ratio, Urine [82043-MALB]

## 2011-02-01 NOTE — Progress Notes (Signed)
Summary: Refill Request  Phone Note Refill Request   Refills Requested: Medication #1:  METOPROLOL TARTRATE 100 MG  TABS 1 bid  Medication #2:  SERTRALINE HCL 100 MG TABS 1 once daily  Medication #3:  FUROSEMIDE 40 MG  TABS 1 by mouth once daily [facility]   Method Requested: Fax to [place] Initial call taken by: Georgette Dover CMA,  September 15, 2010 4:49 PM    Prescriptions: SERTRALINE HCL 100 MG TABS (SERTRALINE HCL) 1 once daily  #90 x 0   Entered by:   Georgette Dover CMA   Authorized by:   Unice Cobble MD   Signed by:   Georgette Dover CMA on 09/15/2010   Method used:   Print then Give to Patient   RxID:   [id number] FUROSEMIDE 40 MG  TABS (FUROSEMIDE) 1 by mouth once daily  #90 x 0   Entered by:   Georgette Dover CMA   Authorized by:   Unice Cobble MD   Signed by:   Georgette Dover CMA on 09/15/2010   Method used:   Print then Give to Patient   RxID:   [id number] METOPROLOL TARTRATE 100 MG  TABS (METOPROLOL TARTRATE) 1 bid  #180 x 0   Entered by:   Georgette Dover CMA   Authorized by:   Unice Cobble MD   Signed by:   Georgette Dover CMA on 09/15/2010   Method used:   Print then Give to Patient   RxID:   [id number]

## 2011-02-01 NOTE — Progress Notes (Signed)
Summary: refill with generic  Phone Note Refill Request Message from:  Patient  Refills Requested: Medication #1:  COZAAR 100 MG TABS 1 by mouth once daily patient said we sent refill request for cozaar BRAND NAME ONLY --- BUT she said she is not allergic to the generic it was an herb she was allergic to ---[facility] generic - patient requesting generic for cozzar  Initial call taken by: Arbie [place],  August 04, 2010 10:46 AM  Follow-up for Phone Call        ok to change back to generic? Ernestene Mention CMA  August 04, 2010 10:48 AM   Additional Follow-up for Phone Call Additional follow up Details #1::        Faxed paper RX back stating ok for patient to have Losartan, changed on med list and on allergy list Additional Follow-up by: Georgette Dover CMA,  August 04, 2010 11:43 AM    New/Updated Medications: LOSARTAN POTASSIUM 100 MG TABS (LOSARTAN POTASSIUM) 1 by mouth once daily

## 2011-02-01 NOTE — Progress Notes (Signed)
Summary: [facility]--LOSARTAN 100 MG  Phone Note Call from Patient   Summary of Call: PT DROP A FORM OFF [facility] LOSARTAN 100 MG   TOOK FORM BACK TO [place] Initial call taken by: Velora Heckler,  August 03, 2010 8:11 AM  Follow-up for Phone Call        Rx was faxed to number provided on form that patient dropped off [phone] Follow-up by: Georgette Dover CMA,  August 03, 2010 10:09 AM    Prescriptions: COZAAR 100 MG TABS (LOSARTAN POTASSIUM) 1 by mouth once daily, patient had reaction to Generic, needs BMN Brand medically necessary #90 x 1   Entered by:   Georgette Dover CMA   Authorized by:   Unice Cobble MD   Signed by:   Georgette Dover CMA on 08/03/2010   Method used:   Printed then faxed to ...       [facility] ([facility])             , [place]         Ph: [phone]       Fax: [phone]   RxID:   [id number]

## 2011-02-01 NOTE — Progress Notes (Signed)
Summary: Katelyn Mahoney  Phone Note Call from Patient Call back at Home Phone [phone]   Caller: Patient Summary of Call: PATIENT DROPPED OFF PAPERWORK FOR [place] [facility]FI[facility] FOR Mahoney HCL [address] , THEN CALL PATIENT TO ADVISE HER IT [place] FAXED  WILL PUT IN PLATIC SLEEVE AND TAKE BACK TO [place] Initial call taken by: Berneta Sages,  February 22, 2010 10:52 AM  Follow-up for Phone Call        [facility] [phone], patient aware Follow-up by: Georgette Dover,  February 22, 2010 2:23 PM    Prescriptions: Mahoney HCL 850 MG TABS (Mahoney HCL) 1 two times a day with 2 largest meals  #180 x 1   Entered by:   Georgette Dover   Authorized by:   Unice Cobble MD   Signed by:   Georgette Dover on 02/22/2010   Method used:   Print then Give to Patient   RxID:   [id number]

## 2011-02-01 NOTE — Assessment & Plan Note (Signed)
Summary: F/U on bloodsugars/scm   Vital Signs:  Patient profile:   77 year old female Weight:      283.2 pounds Temp:     97.9 degrees F oral Pulse rate:   64 / minute Resp:     17 per minute BP sitting:   122 / 78  (left arm) Cuff size:   large  Vitals Entered By: Georgette Dover CMA (October 17, 2010 1:20 PM) CC: Follow-up visit: discuss bloodsugars (copy of labs given), Type 2 diabetes mellitus follow-up   Primary Care Provider:  Unice Cobble  CC:  Follow-up visit: discuss bloodsugars (copy of labs given) and Type 2 diabetes mellitus follow-up.  History of Present Illness: Type 2 Diabetes Mellitus Follow-Up      This is a 77 year old woman who presents for Type 2 diabetes mellitus follow-up.  The patient reports polydipsia, weight loss ( ? 15 #), and numbness of extremities, but denies polyuria, blurred vision, and self managed hypoglycemia.  The patient denies the following symptoms: neuropathic pain, chest pain, vomiting, orthostatic symptoms, poor wound healing, intermittent claudication, vision loss, and foot ulcer.  Since the last visit the patient reports good dietary compliance ( increased vegetables)and not exercising regularly except chair exercises.  The patient has been measuring capillary blood glucose before breakfast , 152-180(average 169).  Since the last visit, the patient reports having had no eye care . She has seen  foot care by a podiatrist.  A1c was 5.9% on Glimiperide, Actos & Metformin 11/2009; 6.2 % off Actos in 07/2010. A1c  7% on Metformin alone.  Current Medications (verified): 1)  Lipitor 20 Mg  Tabs (Atorvastatin Calcium) .Marland Kitchen.. 1 By Mouth At Bedtime 2)  Metoprolol Tartrate 100 Mg  Tabs (Metoprolol Tartrate) .Marland Kitchen.. 1 Bid 3)  Furosemide 40 Mg  Tabs (Furosemide) .Marland Kitchen.. 1 By Mouth Once Daily 4)  Losartan Potassium 100 Mg Tabs (Losartan Potassium) .Marland Kitchen.. 1 By Mouth Once Daily 5)  Shark Cartiliage 750mg  2 Tabs Bid 6)  Aspirin 81 Mg Tabs (Aspirin) .... 3 By Mouth Once  Daily 7)  Multivitamin 1 Po Bid 8)  Fish Oil Bid 9)  Tylenol Arthritis .... 2 Tabs Qpm 10)  Onetouch Ultra Test   Strp (Glucose Blood) .... Use As Directed Once Daily 11)  Onetouch Ultrasoft Lancets   Misc (Lancets) .... Use As Directed Once Daily 12)  Bilberry 470mg  .... 1 By Mouth Two Times A Day 13)  Sertraline Hcl 100 Mg Tabs (Sertraline Hcl) .Marland Kitchen.. 1 Once Daily 14)  Hair Vit .Marland Kitchen.. 1 By Mouth Once Daily 15)  Metformin Hcl 850 Mg Tabs (Metformin Hcl) .Marland Kitchen.. 1 Two Times A Day With 2 Largest Meals 16)  Cinnamon 1000mg  .... 1 By Mouth Two Times A Day 17)  Salmon Oil  Caps (Nutritional Supplements) .Marland Kitchen.. 1 By Mouth Once Daily 18)  Chromium Iodinate .Marland Kitchen.. 1 By Mouth Once Daily 19)  Minocycline Hcl 100 Mg Tabs (Minocycline Hcl) .Marland Kitchen.. 1 By Mouth Two Times A Day When Flare Up Occurs Otherwise 1 By Mouth Every Other Day  Allergies: 1)  ! Pcn 2)  ! Celexa 3)  ! Wellbutrin 4)  ! Norvasc  Physical Exam  General:  well-nourished;alert,appropriate and cooperative throughout examination Lungs:  Normal respiratory effort, chest expands symmetrically. Lungs are clear to auscultation, no crackles or wheezes. Heart:  regular rhythm, no murmur, no gallop, no rub, no JVD, and bradycardia.   Pulses:  R and L carotid,radial, pulses are full and equal bilaterally. Pedal pulses decreased  w/o ischemic changes Extremities:  No clubbing, cyanosis.Lipidedema. Good nail health Neurologic:  alert & oriented X3 and sensation intact to light touch over feet.   Skin:  Intact without suspicious lesions or rashes. Faint hyperpigmentation posterior calves w/o temp change or tenderness Psych:  memory intact for recent and remote, normally interactive, and good eye contact.     Impression & Recommendations:  Problem # 1:  DIABETES MELLITUS, UNCONTROLLED (ICD-250.02)  The following medications were removed from the medication list:    Actos 30 Mg Tabs (Pioglitazone hcl) .Marland Kitchen... 1 by mouth once daily    Metformin Hcl 850 Mg  Tabs (Metformin hcl) .Marland Kitchen... 1 two times a day with 2 largest meals Her updated medication list for this problem includes:    Losartan Potassium 100 Mg Tabs (Losartan potassium) .Marland Kitchen... 1 by mouth once daily    Aspirin 81 Mg Tabs (Aspirin) .Marland KitchenMarland KitchenMarland KitchenMarland Kitchen 3 by mouth once daily    Janumet 50-1000 Mg Tabs (Sitagliptin-metformin hcl) .Marland Kitchen... 1 two times a day with meals  Complete Medication List: 1)  Lipitor 20 Mg Tabs (Atorvastatin calcium) .Marland Kitchen.. 1 by mouth at bedtime 2)  Metoprolol Tartrate 100 Mg Tabs (Metoprolol tartrate) .Marland Kitchen.. 1 bid 3)  Furosemide 40 Mg Tabs (Furosemide) .Marland Kitchen.. 1 by mouth once daily 4)  Losartan Potassium 100 Mg Tabs (Losartan potassium) .Marland Kitchen.. 1 by mouth once daily 5)  Shark Cartiliage 750mg  2 Tabs Bid  6)  Aspirin 81 Mg Tabs (Aspirin) .... 3 by mouth once daily 7)  Multivitamin 1 Po Bid  8)  Fish Oil Bid  9)  Tylenol Arthritis  .... 2 tabs qpm 10)  Onetouch Ultra Test Strp (Glucose blood) .... Use as directed once daily 11)  Onetouch Ultrasoft Lancets Misc (Lancets) .... Use as directed once daily 12)  Bilberry 470mg   .... 1 by mouth two times a day 13)  Sertraline Hcl 100 Mg Tabs (Sertraline hcl) .Marland Kitchen.. 1 once daily 14)  Hair Vit  .Marland KitchenMarland Kitchen. 1 by mouth once daily 15)  Cinnamon 1000mg   .... 1 by mouth two times a day 16)  Salmon Oil Caps (Nutritional supplements) .Marland Kitchen.. 1 by mouth once daily 17)  Chromium Iodinate  .Marland KitchenMarland Kitchen. 1 by mouth once daily 18)  Minocycline Hcl 100 Mg Tabs (Minocycline hcl) .Marland Kitchen.. 1 by mouth two times a day when flare up occurs otherwise 1 by mouth every other day 19)  Janumet 50-1000 Mg Tabs (Sitagliptin-metformin hcl) .Marland Kitchen.. 1 two times a day with meals  Patient Instructions: 1)  Check your blood sugars regularly. If your readings are usually above : 150 or below 90 you should contact our office. 2)  See your eye doctor yearly to check for diabetic eye damage. 3)  Check your feet each night for sore areas, calluses or signs of infection. 4)  Please schedule a follow-up appointment  in 3 months. 5)  BUN,creat, K+ prior to visit, ICD-9:401.9 6)  HbgA1C prior to visit, ICD-9:250.02 7)  Urine Microalbumin prior to visit, ICD-9:250.02 Prescriptions: JANUMET 50-1000 MG TABS (SITAGLIPTIN-METFORMIN HCL) 1 two times a day with meals  #60 x 2   Entered and Authorized by:   Unice Cobble MD   Signed by:   Unice Cobble MD on 10/17/2010   Method used:   Print then Give to Patient   RxID:   [id number]    Orders Added: 1)  Est. Patient Level IV [id number]

## 2011-02-01 NOTE — Progress Notes (Signed)
Summary: RX  Phone Note Refill Request Call back at Home Phone [phone] Message from:  Patient on January 19, 2011 11:35 AM  Refills Requested: Medication #1:  JANUMET 50-1000 MG TABS 1 two times a day with meals.   Dosage confirmed as above?Dosage Confirmed   Supply Requested: [facility]  Initial call taken by: Velora Heckler,  January 19, 2011 11:35 AM    Prescriptions: JANUMET 50-1000 MG TABS (SITAGLIPTIN-METFORMIN HCL) 1 two times a day with meals  #180 x 1   Entered by:   [place] by:   Unice Cobble MD   Signed by:   Georgette Dover CMA on 01/19/2011   Method used:   Faxed to ...       [facility])             , [place]         Ph: [phone]       Fax: [phone]   RxID:   [id number]

## 2011-02-15 NOTE — Letter (Signed)
Summary: Application for Handicapped Placard  Application for Handicapped Placard   Imported By: Laural Benes 02/05/2011 13:19:05  _____________________________________________________________________  External Attachment:    Type:   Image     Comment:   External Document

## 2011-04-04 ENCOUNTER — Telehealth: Payer: Self-pay | Admitting: Internal Medicine

## 2011-04-04 MED ORDER — SITAGLIPTIN PHOS-METFORMIN HCL 50-1000 MG PO TABS: 1.0000 | ORAL_TABLET | Freq: Two times a day (BID) | ORAL | Status: DC

## 2011-04-04 NOTE — Telephone Encounter (Signed)
Pt walk in need a script [facility] janumet 50-1000 mg

## 2011-04-12 LAB — COMPREHENSIVE METABOLIC PANEL
ALT: 17 U/L (ref 0–35)
AST: 21 U/L (ref 0–37)
Alkaline Phosphatase: 61 U/L (ref 39–117)
GFR calc Af Amer: >60 mL/min (ref >60)
GFR calc non Af Amer: 56 mL/min — ABNORMAL LOW (ref >60)
Total Protein: 6.8 g/dL (ref 6.0–8.3)

## 2011-04-12 LAB — URINALYSIS, ROUTINE W REFLEX MICROSCOPIC
Glucose, UA: NEGATIVE mg/dL
Ketones, ur: NEGATIVE mg/dL
Nitrite: NEGATIVE
Protein, ur: NEGATIVE mg/dL

## 2011-04-12 LAB — CULTURE, BLOOD (ROUTINE X 2)
Culture: NO GROWTH
Culture: NO GROWTH

## 2011-04-12 LAB — BASIC METABOLIC PANEL
CO2: 26 mEq/L (ref 19–32)
Chloride: 105 mEq/L (ref 96–112)
GFR calc Af Amer: 51 mL/min — ABNORMAL LOW (ref >60)
Sodium: 138 mEq/L (ref 135–145)

## 2011-04-12 LAB — GLUCOSE, CAPILLARY
Glucose-Capillary: 148 mg/dL — ABNORMAL HIGH (ref 70–99)
Glucose-Capillary: 148 mg/dL — ABNORMAL HIGH (ref 70–99)
Glucose-Capillary: 156 mg/dL — ABNORMAL HIGH (ref 70–99)
Glucose-Capillary: 186 mg/dL — ABNORMAL HIGH (ref 70–99)
Glucose-Capillary: 188 mg/dL — ABNORMAL HIGH (ref 70–99)
Glucose-Capillary: 231 mg/dL — ABNORMAL HIGH (ref 70–99)

## 2011-04-12 LAB — CARDIAC PANEL(CRET KIN+CKTOT+MB+TROPI)
CK, MB: 1.9 ng/mL (ref 0.3–4.0)
CK, MB: 2 ng/mL (ref 0.3–4.0)
Relative Index: INVALID (ref 0.0–2.5)
Relative Index: INVALID (ref 0.0–2.5)
Total CK: 85 U/L (ref 7–177)
Troponin I: 0.04 ng/mL (ref 0.00–0.06)

## 2011-04-12 LAB — CBC
MCV: 98.7 fL (ref 78.0–100.0)
RBC: 3.9 MIL/uL (ref 3.87–5.11)
RDW: 15.2 % (ref 11.5–15.5)

## 2011-04-12 LAB — BRAIN NATRIURETIC PEPTIDE: Pro B Natriuretic peptide (BNP): 382 pg/mL — ABNORMAL HIGH (ref 0.0–100.0)

## 2011-04-12 LAB — URINE MICROSCOPIC-ADD ON

## 2011-04-12 LAB — DIFFERENTIAL
Basophils Absolute: 0 10*3/uL (ref 0.0–0.1)
Monocytes Absolute: 0.8 10*3/uL (ref 0.1–1.0)
Neutrophils Relative %: 83 % — ABNORMAL HIGH (ref 43–77)

## 2011-05-04 ENCOUNTER — Other Ambulatory Visit: Payer: Self-pay

## 2011-05-04 MED ORDER — LOSARTAN POTASSIUM 100 MG PO TABS: 100.0000 mg | ORAL_TABLET | Freq: Every day | ORAL | Status: DC

## 2011-05-15 NOTE — Discharge Summary (Signed)
NAME:  Katelyn Mahoney, ZAIS NO.:  [id number]   MEDICAL RECORD NO.:  [id number]          Mah[facility] TYPE:  INP   LOCATION:  1508                         FACILITY:  [facility]   PHYSICIAN:  Heinz Knuckles. Norins, MD  DATE OF BIRTH:  12/23/1934   DATE OF ADMISSION:  03/10/2009  DATE OF DISCHARGE:  03/14/2009                               DISCHARGE SUMMARY   ADMITTING DIAGNOSES:  1. Asthmatic bronchitis.  2. Adult-onset diabetes.  3. Pulmonary congestion and hypostasis.   DISCHARGE DIAGNOSES:  Katelyn same.   CONSULTANTS:  Dr. Quentin Ore for cardiology.   PROCEDURES:  1. Chest x-ray day of admission which revealed prominent interstitial      and perihilar markings which may be chronic and can not exclude      mild interstitial edema.  2. Chest x-ray March 12 which showed stable mild cardiomegaly and      probable chronic pulmonary interstitial prominence with no      significant change to admission exam.  3. A 2D echo performed March 12 which showed overall left systolic      function to be normal with an EF of 60%.  Left ventricular wall      thickness was mildly increased.  Aortic valve was mildly calcified      with very mild aortic valvular stenosis.   HISTORY OF PRESENT ILLNESS:  Katelyn Mahoney is a 77 year old woman followed  by Dr. Linna Darner at [facility] who presented to  [facility] emergency department with a 1-week complaint of cough productive of  yellow sputum.  She had cold and hot spells but no documented fever.  Over Katelyn 24 hours prior to admission she had increasing shortness of  breath to where she had to sleep sitting up.  She developed significant  wheezing and tightness in her chest.  She denied any anginal-type pain  and there is no radiation of tightness or pain to her jaw or arm.  ED  evaluation revealed a leukocytosis, mild hypoxemia.  Chest x-ray with  mild perihilar markings and mild interstitial edema consistent with  asthmatic  bronchitis.   HOSPITAL COURSE:  1. Pulmonary:  Katelyn Mahoney was admitted with asthmatic bronchitis.      She was started on Avelox 400 mg p.o. daily.  She was started on      prednisone 20 mg b.i.d.  She was started on albuterol metered-dose      inhaler with AeroChamber 2 puffs q.i.d. and was put on oxygen.  On      this regimen Katelyn Mahoney slowly improved.  At Katelyn time of discharge      she is not wheezing, she has no cough, respirations are unlabored,      chest x-ray on Katelyn 12th as noted.  At this point she is ready to      discharge home, will continue Avelox to complete a full 10 days of      treatment..  She will be put on a taper of prednisone.  She will      continue with a ProAir HFA inhaler with AeroChamber 2 puffs three  times a day until seen in followup.  2. Diabetes:  Katelyn Mahoney was continued on her home medication with      metformin.  She was continued on Actos and glimepiride.  Katelyn      Mahoney does have mildly elevated CBGs which are a steroid effect.      Will not adjust her medications for this.  3. Pulmonary congestion and hypostasis:  Katelyn Mahoney did have some      congestion and fluid overload.  She did require IV diuresis.  She      was seen in consultation by [facility] cardiology service because of      concern for cardiac-related pulmonary edema.  A 2D echo was      performed as noted.  Consult was obtained with no indication or      recommendations for further cardiac evaluation.  Katelyn Mahoney did      have a good diuresis based on Is and Os, question of accuracy, of      approximately 3 - 1/2 liters.  At this point she can be discharged      home to continue on her home medications that does include Lasix on      a daily basis.   DISCHARGE EXAMINATION:  Temperature was 98.3, blood pressure 117/56,  heart rate 59, respirations 20, O2 sats 99% on 2 liters.  CBG was 188.  GENERAL APPEARANCE:  This is an obese Caucasian woman in bed in no acute  distress.   HEENT:  Unremarkable.  CHEST:  Mahoney is moving air well.  There is no rales, wheezes or  rhonchi on my exam.  CARDIOVASCULAR:  With 2+ radial pulse, her precordium is quiet, she had  a regular rate and rhythm.  ABDOMEN:  Massively obese, soft with no guarding or rebound.   FINAL LABORATORY:  Blood cultures were drawn at time of admission, were  no growth at discharge.  Basic metabolic panel from March 12 with a  sodium 138, potassium 4.8, chloride 105, CO2 of 26, BUN 21, creatinine  1.25, glucose was 205.  Katelyn Mahoney had cardiac enzymes cycled that were  negative x3.  Katelyn Mahoney had a beta natriuretic peptide at Katelyn time of  admission that was mildly elevated at 382.   DISPOSITION:  Katelyn Mahoney is discharged home.  She will continue on all  of her home medications including:  1. Metformin 500 mg b.i.d.  2. Glimepiride 2 mg daily.  3. Metoprolol tartrate 100 mg b.i.d.  4. Furosemide 40 mg daily.  5. Cozaar 100 mg daily.  6. Zoloft 100 mg daily.  7. Lipitor 20 mg nightly.  8. Actos 30 mg daily.  9. Shark cartilage, multivitamins and bilberry as at home.  10.Aspirin 81 mg daily.  11.In addition she will take Avelox 400 mg daily through [facility] 21st.  12.Katelyn Mahoney will continue prednisone at 30 mg daily times three, 20      mg daily times three, 10 mg daily times six.   DISPOSITION:  Discharge home.  Katelyn Mahoney will be seen by her primary  care physician Dr. Linna Darner in 5 - 7 days.   CONDITION:  At time of discharge dictation is stable and improved.      Heinz Knuckles Norins, MD  Electronically Signed     MEN/MEDQ  D:  03/14/2009  T:  03/14/2009  Job:  [id number]   cc:   Darrick Penna. Linna Darner, MD,FACP,FCCP  [place]  [place] [address]

## 2011-05-15 NOTE — Consult Note (Signed)
NAME:  Katelyn Mahoney, VENZOR NO.:  [id number]   MEDICAL RECORD NO.:  [id number]          PATIENT TYPE:  INP   LOCATION:  1508                         FACILITY:  [facility]   PHYSICIAN:  Aurea Graff, MD   DATE OF BIRTH:  10/17/1934   DATE OF CONSULTATION:  03/12/2009  DATE OF DISCHARGE:                                 CONSULTATION   REQUESTING PHYSICIAN:  Dr. Linda Hedges.   PRIMARY CARE PHYSICIAN:  Darrick Penna. Linna Darner, MD.   Earnstine Regal CARDIOLOGISTVelora Heckler Cardiology.   CHIEF COMPLAINT:  Shortness of breath, cough and lower extremity edema.   HISTORY OF PRESENT ILLNESS:  This is a 77 year old white female admitted  2 days ago with cough, low grade fevers, shortness of breath for a week  and wheezing.  This all started 2 weeks ago with a sore throat.  She has  had no anginal chest pain, orthopnea or PND.  She does have increased  lower extremity edema.  An echocardiogram noted mild aortic stenosis.  Cardiac enzymes have been normal.  The patient still feels short of  breath at rest and is having pleuritic chest pain with lots of coughing  despite 2 days of antibiotics, nebulizers and prednisone.   PAST MEDICAL HISTORY:  1. Obesity.  2. Diabetes mellitus type 2.  3. Hypertension.  4. Asthma.  5. Depression.  6. History of a broken left ankle with chronic swelling.   SOCIAL HISTORY:  Lives in [place]. She lives alone.  She does  pick her grandson up from school every day and is usually fairly active.  She is a widow x3.  She is a retired [employer]. She is a lifelong  nonsmoker and does not drink alcohol.   FAMILY HISTORY:  Mother died of old age.  Father died of cancer.  Sister  died of breast cancer.  Brother died of lung cancer.   ALLERGIES:  PENICILLIN, CELEXA, WELLBUTRIN, NORVASC.   OUTPATIENT MEDICINES:  Aspirin 81 mg p.o. b.i.d., metformin 500 mg  b.i.d. Lipitor 20 mg p.o. daily, Lasix 40 mg p.o. daily, Actos 30 mg  p.o. daily, fish oil b.i.d.,  Lopressor 100 mg b.i.d., Zoloft 100 mg  daily, Cozaar 100 mg daily, glimepiride 2 mg p.o. daily.   REVIEW OF SYSTEMS:  Positive for fevers, sweats, weight gain, shortness  of breath, dyspnea on exertion, cough, wheezing and depression.  Otherwise the balance of 14 systems are reviewed and are negative.   PHYSICAL EXAM:  VITAL SIGNS:  Temperature 97.5, pulse 70, respiratory  rate 16-24, blood pressure 149/61, saturation 94% on 3 liters.  GENERAL:  This is an obese pleasant white female in no acute distress.  HEENT:  Pupils are round and reactive.  Sclerae are clear.  Dentition is  good.  Mucous membranes are moist.  Neck is supple.  Neck veins are  flat.  No carotid bruits.  LUNGS:  Diffuse wheezing in all 4 lung fields with inspiration and  expiration, very prominent.  CARDIAC:  Normal rate, regular rhythm, 1/6 systolic ejection murmur at  the base. Frequent PVCs.  ABDOMEN:  Obese, soft, nontender.  EXTREMITIES:  With 1+ pitting edema bilaterally of the feet and up to  the mid shin.  No calf tenderness.  NEUROLOGIC:  With 5/5 strength in  all 4 extremities.  No facial droop, and facial expressions are  symmetric.  MUSCULOSKELETAL:  No acute joint effusions or deformities.   DIAGNOSTIC TESTS:  Chest x-ray shows mild cardiomegaly with chronic  interstitial prominence. EKG shows sinus rhythm with ventricular  trigeminy and a rate of 63 beats minute. Echocardiogram shows LV  ejection fraction 60%, mild mitral regurgitation, very mild aortic  stenosis with a mean gradient of 12 mmHg, normal mitral inflow and  estimated pulmonary artery systolic pressure of 48 mmHg.   LABS:  White blood cells 14.5, hemoglobin 13.6 platelets 307.  Sodium  138, potassium 4.8, BUN 21, creatinine 1.2, glucose 205, BNP 382, CK 85,  troponin 0.05.   IMPRESSION:  A 77 year old white female with cough, wheezing, profound  shortness of breath and some lower extremity edema but this does seem to  be predominantly  a pulmonary issue.  1. She has significant wheezing at this time.  As noted she is being      treated with Avelox, nebulizers, and prednisone.  This certainly      seems consistent with bronchitis and asthma.  However, given that      she remains symptomatic with little improvement, question whether      she needs chest CT scan or any other diagnostic testing that the      pulmonary team might recommend.  2. Doubt there is a significant cardiac component to her symptoms.      Her LV ejection fraction is normal. Remarkably the diastolic      filling appears normal.  Aortic stenosis is very mild and should      not really be a hemodynamic issue.  Of note, the estimated      pulmonary artery systolic pressure is mildly elevated, and this      could be related to lung disease, body habitus or obstructive sleep      apnea.  Question if she would benefit from CPAP.  3. Agree with your current medical plan and we appreciate the consult.      One thing to consider would be decreasing or discontinuing her beta-      blocker while she is actively wheezing.  If heart rate control      becomes an issue, a calcium channel blocker might be a better      option.  We will follow along.      Aurea Graff, MD  Electronically Signed     NDL/MEDQ  D:  03/12/2009  T:  03/13/2009  Job:  [id number]

## 2011-05-18 NOTE — Discharge Summary (Signed)
NAME:  Katelyn Mahoney, BONNETTE NO.:  [id number]   MEDICAL RECORD NO.:  [id number]          PATIENT TYPE:  INP   LOCATION:  5730                         FACILITY:  [facility]   PHYSICIAN:  Drema Pry, D.O. [facility]   DATE OF BIRTH:  April 25, 1934   DATE OF ADMISSION:  05/24/2005  DATE OF DISCHARGE:  05/26/2005                                 DISCHARGE SUMMARY   DISCHARGE DIAGNOSES:  1.  Acute nausea, vomiting, diarrhea presumed secondary to gastroenteritis      versus food poisoning.  2.  Diabetes mellitus.  3.  Hypertension.  4.  Depression.  5.  Dyslipidemia.   DISCHARGE MEDICATIONS:  1.  Norvasc 5 mg once daily.  2.  Lotensin 20 mg once daily.  3.  Maxzide 37.5/25 once daily.  4.  Lopressor 50 mg twice daily.  5.  Zocor 40 mg at bedtime.  6.  Actos 30 mg once daily.  7.  Aspirin 81 mg once daily.  8.  Glucophage 1000 mg twice daily.  9.  Zoloft 50 mg once daily.  10. Amaryl 2 mg before a.m. meal.  11. Imodium 2 mg twice a day as needed.  12. Ciprofloxacin 500 mg twice daily x5 days.   FOLLOWUP INSTRUCTIONS:  She was instructed to follow up with Dr. Linna Darner in  one week.  She is to call his office for an appointment.  The patient is to  follow a bland diet for the next three to five days.   HOSPITAL COURSE:  The patient is a 77 year old white female with type 2  diabetes, who had acute onset of liquid stools and nausea and vomiting.  The  patient denied any bright red blood per rectum or coffee grounds.  The  patient may have had some food poisoning after she ate a [employer]  before admission.  The patient was admitted for IV hydration.  The patient  had an abdominal series which showed no free air, no signs of obstruction.  She did have some leukocytosis with left shift on admission.  Her WBCs were  19.4.  With IV hydration, they returned to 9.7 within normal limits.  Her  blood sugars remained relatively stable.   Problem 1. Acute nausea, vomiting, diarrhea likely  secondary to infectious  gastroenteritis versus food poisoning.  The patient also did have evidence  of possible UTI on admission with leukocytes in her urine, as well as  bacteria.  The patient will be empirically started on ciprofloxacin 500 mg  twice a day x5 days.   Problem 2. Hypertension.  Blood pressure remained stable.   Problem 3. Diabetes.  The patient is to continue her Glucophage, Actos and  Amaryl as an outpatient.  All other problems stable.   Prior to discharge, the patient had no more further episodes of nausea and  vomiting.  Her diarrhea had resolved and the patient tolerated a full  breakfast without difficulty.  The patient has to follow up with Dr. Linna Darner  in one week.   PROGNOSIS:  Good.      RY/MEDQ  D:  05/26/2005  T:  05/26/2005  Job:  [id number]   cc:   Darrick Penna. Linna Darner, M.D. [facility]

## 2011-05-18 NOTE — Procedures (Signed)
[place]  Patient:    Katelyn Mahoney, Katelyn Mahoney Visit Number: [id number] MRN: [id number]          Service Type: Attending:  Nelwyn Salisbury, M.D. Dictated by:   Nelwyn Salisbury, M.D. Proc. Date: 01/15/02   CC:         Darrick Penna. Linna Darner, M.D. [facility]  [place]in H. Toney Rakes, M.D.   Procedure Report  DATE OF BIRTH:  03-29-1934.  REFERRING PHYSICIAN:  Darrick Penna. Linna Darner, M.D. [facility]  PROCEDURE PERFORMED:  Colonoscopy.  ENDOSCOPIST:  Nelwyn Salisbury, M.D.  INSTRUMENT USED:  Olympus video colonoscope.  INDICATIONS FOR PROCEDURE:  The patient is a  76 year old white female with history of blood in stool and family history of "stomach cancer."  Rule out colonic polyps, masses, hemorrhoids, etc.  PREPROCEDURE PREPARATION:  Informed consent was procured from the patient. The patient was fasted for eight hours prior to the procedure and prepped with a bottle of magnesium citrate and a gallon of NuLytely the night prior to the procedure.  PREPROCEDURE PHYSICAL:  The patient had stable vital signs.  Neck supple. Chest clear to auscultation.  S1, S2 regular.  Abdomen soft with normal abdominal bowel sounds.  DESCRIPTION OF PROCEDURE:  The patient was placed in the left lateral decubitus position and sedated with 50 mg of Demerol and 5 mg of Versed intravenously.  Once the patient was adequately sedated and maintained on low-flow oxygen and continuous cardiac monitoring, the Olympus video colonoscope was advanced from the rectum to the cecum without difficulty.  The patient did have some residual stool in the colon.  No masses, polyps, erosions, ulcerations or diverticula were seen.  The patient had a small internal hemorrhoid and tolerated the procedure well without complication.  IMPRESSION: 1. Healthy-appearing colon up to the cecum. 2. Small nonbleeding internal hemorrhoid seen.  RECOMMENDATIONS: 1. Repeat colorectal cancer screening is  recommended in the next five years    unless the patient develops any abnormal symptoms in the interim. 2. A high fiber diet has been recommended. 3. If the patient continues to have rectal bleeding, she is report the    symptoms to the office at the earliest so that further work-up can be    undertaken. Dictated by:   Nelwyn Salisbury, M.D. Attending:  Nelwyn Salisbury, M.D. DD:  01/15/02 TD:  01/15/02 Job: [id number]

## 2011-05-23 ENCOUNTER — Other Ambulatory Visit: Payer: Self-pay | Admitting: *Deleted

## 2011-05-23 MED ORDER — SERTRALINE HCL 100 MG PO TABS: 100.0000 mg | ORAL_TABLET | Freq: Every day | ORAL | Status: DC

## 2011-05-23 MED ORDER — SITAGLIPTIN PHOS-METFORMIN HCL 50-1000 MG PO TABS: 1.0000 | ORAL_TABLET | Freq: Two times a day (BID) | ORAL | Status: DC

## 2011-06-27 ENCOUNTER — Encounter: Payer: Self-pay | Admitting: Internal Medicine

## 2011-07-02 ENCOUNTER — Encounter: Payer: Self-pay | Admitting: Podiatry

## 2011-07-02 ENCOUNTER — Encounter: Payer: Self-pay | Admitting: Internal Medicine

## 2011-07-16 ENCOUNTER — Other Ambulatory Visit: Payer: Self-pay | Admitting: Internal Medicine

## 2011-07-17 ENCOUNTER — Other Ambulatory Visit (INDEPENDENT_AMBULATORY_CARE_PROVIDER_SITE_OTHER): Payer: BC Managed Care – PPO

## 2011-07-17 NOTE — Progress Notes (Signed)
Labs only

## 2011-08-02 ENCOUNTER — Other Ambulatory Visit: Payer: Self-pay

## 2011-08-02 MED ORDER — SITAGLIPTIN PHOS-METFORMIN HCL 50-1000 MG PO TABS: 1.0000 | ORAL_TABLET | Freq: Two times a day (BID) | ORAL | Status: DC

## 2011-08-02 MED ORDER — METOPROLOL TARTRATE 100 MG PO TABS: 100.0000 mg | ORAL_TABLET | Freq: Two times a day (BID) | ORAL | Status: DC

## 2011-08-02 NOTE — Telephone Encounter (Signed)
RX's sent to pharmacy 

## 2011-08-30 ENCOUNTER — Encounter: Payer: Self-pay | Admitting: Internal Medicine

## 2011-08-30 ENCOUNTER — Ambulatory Visit (INDEPENDENT_AMBULATORY_CARE_PROVIDER_SITE_OTHER): Payer: BC Managed Care – PPO | Admitting: Internal Medicine

## 2011-08-30 DIAGNOSIS — I10 Essential (primary) hypertension: Secondary | ICD-10-CM

## 2011-08-30 DIAGNOSIS — D580 Hereditary spherocytosis: Secondary | ICD-10-CM | POA: Insufficient documentation

## 2011-08-30 DIAGNOSIS — E119 Type 2 diabetes mellitus without complications: Secondary | ICD-10-CM

## 2011-08-30 DIAGNOSIS — E785 Hyperlipidemia, unspecified: Secondary | ICD-10-CM

## 2011-08-30 MED ORDER — SITAGLIPTIN PHOS-METFORMIN HCL 50-1000 MG PO TABS: 1.0000 | ORAL_TABLET | ORAL | Status: DC

## 2011-08-30 NOTE — Patient Instructions (Signed)
Eat a low-fat diet with lots of fruits and vegetables, up to 7-9 servings per day. Avoid obesity; your goal is waist measurement < 35  inches.Consume less than  30 grams of sugar per day from foods & drinks with High Fructose Corn Sugar as #1,2,3 or # 4 on label. Follow the low carb nutrition program in [place] as closely as possible to prevent Diabetes progression & complications. White carbohydrates (potatoes, rice, bread, and pasta) have a high spike of sugar and a high load of sugar. For example a  baked potato has a cup of sugar and a  french fry  2 teaspoons of sugar. Yams, wild  rice, whole grained bread &  wheat pasta have been much lower spike and load of  sugar. Portions should be the size of a deck of cards or your palm.  Please  schedule fasting Labs in 4 months: BMET,Lipids, hepatic panel,  TSH, A1c, urine microalbumin (250.00, 401.9, 272.4)

## 2011-08-30 NOTE — Progress Notes (Signed)
  Subjective:    Patient ID: Katelyn Mahoney, female    DOB: February 18, 1934, 77 y.o.   MRN: [id number]  HPI#1  HYPERTENSION: Disease Monitoring: Blood pressure range-well controlled, 125/65-70 on average  Chest pain, palpitations- chronic , intermittent palpitations for years       Dyspnea- only DOE Medications: Compliance- yes  Lightheadedness,Syncope- no    Edema- minimal  #2 DIABETES: Disease Monitoring: Blood Sugar ranges-FBS 116-138  Polyuria/phagia/dipsia- no       Visual problems- no; last Ophth exam 6/12, no retinopathy Skin lesions : no Numbness , tingling , burning in extremities : no but soles feel "hard". Podiatrist seen recently Medications: Compliance- no  Hypoglycemic symptoms- no A1c 6.3% in 7/12  #3HYPERLIPIDEMIA: Disease Monitoring: See symptoms for Hypertension Medications: Compliance- yes  Abd pain, bowel changes- no   Muscle aches- no    Smoking Status :never , but protracted 2nd hand exposure      Review of Systems     Objective:   Physical Exam Gen.: Healthy and well-nourished in appearance. Alert, appropriate and cooperative throughout exam. Weight excess Eyes: No corneal or conjunctival inflammation noted.  Neck: No deformities, masses, or tenderness noted.Thyroid normal. Lungs: Normal respiratory effort; chest expands symmetrically. Lungs are clear to auscultation without rales, wheezes, or increased work of breathing. Heart: Normal rate and rhythm. Normal S1 and S2. No gallop, click, or rub. S4 w/o murmur.                                                     Musculoskeletal/extremities: No clubbing, cyanosis, edema, or deformity noted. Joints essentially normal. Nail health  Good. Using cane due to knee DJD . Crepitus prsent Vascular: Carotid, radial artery pulses are full and equal. Pedal pulses slightly decreased.No bruits present. Neurologic: Alert and oriented x3. Deep tendon reflexes symmetrical and 1/2+.   Light touch over feet  essentially npormal       Skin: Intact without suspicious lesions or rashes. Psych: Mood and affect are normal. Normally interactive                                                                                         Assessment & Plan:  #1 diabetes, good control. A1c was 6.3% in  7/12. Janumet  will be decreased to half twice a day with a followup A1c in 4 months. Diabetes she used to be ordered. She may have an atypical in neuropathy of the feet (see above)  #2 hypertension, controlled  #3 hyperlipidemia, that he is at goal  #4 degenerative joint disease of knees

## 2011-10-02 ENCOUNTER — Other Ambulatory Visit: Payer: Self-pay

## 2011-10-02 MED ORDER — METOPROLOL TARTRATE 100 MG PO TABS: 100.0000 mg | ORAL_TABLET | Freq: Two times a day (BID) | ORAL | Status: DC

## 2011-10-02 MED ORDER — LOSARTAN POTASSIUM 100 MG PO TABS: 100.0000 mg | ORAL_TABLET | Freq: Every day | ORAL | Status: DC

## 2011-10-03 ENCOUNTER — Ambulatory Visit (INDEPENDENT_AMBULATORY_CARE_PROVIDER_SITE_OTHER): Payer: BC Managed Care – PPO

## 2011-10-03 DIAGNOSIS — Z23 Encounter for immunization: Secondary | ICD-10-CM

## 2011-10-18 ENCOUNTER — Other Ambulatory Visit: Payer: Self-pay

## 2011-10-18 MED ORDER — ONETOUCH DELICA LANCETS MISC: Status: DC

## 2011-12-13 ENCOUNTER — Other Ambulatory Visit: Payer: Self-pay | Admitting: Internal Medicine

## 2011-12-13 DIAGNOSIS — I10 Essential (primary) hypertension: Secondary | ICD-10-CM

## 2011-12-13 DIAGNOSIS — E785 Hyperlipidemia, unspecified: Secondary | ICD-10-CM

## 2011-12-13 DIAGNOSIS — E119 Type 2 diabetes mellitus without complications: Secondary | ICD-10-CM

## 2011-12-14 ENCOUNTER — Other Ambulatory Visit (INDEPENDENT_AMBULATORY_CARE_PROVIDER_SITE_OTHER): Payer: BC Managed Care – PPO

## 2011-12-14 DIAGNOSIS — I10 Essential (primary) hypertension: Secondary | ICD-10-CM

## 2011-12-14 DIAGNOSIS — E785 Hyperlipidemia, unspecified: Secondary | ICD-10-CM

## 2011-12-14 DIAGNOSIS — E119 Type 2 diabetes mellitus without complications: Secondary | ICD-10-CM

## 2011-12-14 LAB — BASIC METABOLIC PANEL
CO2: 27 mEq/L (ref 19–32)
Calcium: 9.1 mg/dL (ref 8.4–10.5)
Glucose, Bld: 134 mg/dL — ABNORMAL HIGH (ref 70–99)
Potassium: 4.7 mEq/L (ref 3.5–5.1)
Sodium: 140 mEq/L (ref 135–145)

## 2011-12-14 LAB — HEMOGLOBIN A1C: Hgb A1c MFr Bld: 6.1 % (ref 4.6–6.5)

## 2011-12-14 LAB — LIPID PANEL
HDL: 65.1 mg/dL (ref >39.00)
Total CHOL/HDL Ratio: 2
VLDL: 26.6 mg/dL (ref 0.0–40.0)

## 2011-12-14 LAB — HEPATIC FUNCTION PANEL: Total Bilirubin: 0.6 mg/dL (ref 0.3–1.2)

## 2011-12-14 LAB — MICROALBUMIN / CREATININE URINE RATIO
Creatinine,U: 57.2 mg/dL
Microalb Creat Ratio: 0.7 mg/g (ref 0.0–30.0)
Microalb, Ur: 0.4 mg/dL (ref 0.0–1.9)

## 2011-12-14 LAB — TSH: TSH: 0.6 u[IU]/mL (ref 0.35–5.50)

## 2011-12-14 NOTE — Progress Notes (Signed)
12  

## 2012-01-07 ENCOUNTER — Encounter: Payer: Self-pay | Admitting: Internal Medicine

## 2012-01-14 ENCOUNTER — Other Ambulatory Visit: Payer: Self-pay | Admitting: *Deleted

## 2012-01-15 ENCOUNTER — Other Ambulatory Visit: Payer: Self-pay

## 2012-01-15 ENCOUNTER — Other Ambulatory Visit: Payer: Self-pay | Admitting: Gynecology

## 2012-01-15 MED ORDER — GLUCOSE BLOOD VI STRP: ORAL_STRIP | Status: DC

## 2012-01-15 MED ORDER — SERTRALINE HCL 100 MG PO TABS: 100.0000 mg | ORAL_TABLET | Freq: Every day | ORAL | Status: DC

## 2012-01-15 NOTE — Telephone Encounter (Signed)
RXs sent.

## 2012-01-17 ENCOUNTER — Encounter: Payer: Self-pay | Admitting: Internal Medicine

## 2012-01-17 ENCOUNTER — Ambulatory Visit (INDEPENDENT_AMBULATORY_CARE_PROVIDER_SITE_OTHER): Payer: BC Managed Care – PPO | Admitting: Internal Medicine

## 2012-01-17 VITALS — BP 124/78 | HR 65 | Temp 98.3°F | Wt 242.0 lb

## 2012-01-17 DIAGNOSIS — I10 Essential (primary) hypertension: Secondary | ICD-10-CM

## 2012-01-17 DIAGNOSIS — M1710 Unilateral primary osteoarthritis, unspecified knee: Secondary | ICD-10-CM

## 2012-01-17 DIAGNOSIS — N6009 Solitary cyst of unspecified breast: Secondary | ICD-10-CM

## 2012-01-17 DIAGNOSIS — E785 Hyperlipidemia, unspecified: Secondary | ICD-10-CM

## 2012-01-17 DIAGNOSIS — M179 Osteoarthritis of knee, unspecified: Secondary | ICD-10-CM

## 2012-01-17 DIAGNOSIS — IMO0001 Reserved for inherently not codable concepts without codable children: Secondary | ICD-10-CM

## 2012-01-17 DIAGNOSIS — Z23 Encounter for immunization: Secondary | ICD-10-CM

## 2012-01-17 MED ORDER — ONETOUCH DELICA LANCETS MISC: Status: DC

## 2012-01-17 MED ORDER — GLUCOSE BLOOD VI STRP: ORAL_STRIP | Status: DC

## 2012-01-17 MED ORDER — SERTRALINE HCL 100 MG PO TABS: 100.0000 mg | ORAL_TABLET | Freq: Every day | ORAL | Status: DC

## 2012-01-17 NOTE — Patient Instructions (Signed)
    Eat a low-fat diet with lots of fruits and vegetables, up to 7-9 servings per day. Consume less than 30 (preferably ZERO) grams of sugar per day from foods & drinks with High Fructose Corn Syrup (HFCS) sugar as #1,2,3 or # 4 on label.Whole [employer], [employer] [place] do not carry products with HFCS. Follow a  low carb nutrition program such as [place] or [employer] [place] [employer]usters  to prevent Diabetes progression . White carbohydrates (potatoes, rice, bread, and pasta) have a high spike of sugar and a high load of sugar. For example a  baked potato has a cup of sugar and a  french fry  2 teaspoons of sugar. Yams, wild  rice, whole grained bread &  wheat pasta have been much lower spike and load of  sugar. Portions should be the size of a deck of cards or your palm.   Please  schedule fasting Labs in 6 mos : BMET,Lipids,A1c, urine microalbumin. PLEASE BRING THESE INSTRUCTIONS TO FOLLOW UP  LAB APPOINTMENT.This will guarantee correct labs are drawn, eliminating need for repeat blood sampling ( needle sticks ! ). Diagnoses /Codes: 250.00

## 2012-01-17 NOTE — Progress Notes (Signed)
  Subjective:    Patient ID: Katelyn Mahoney, female    DOB: Mar 30, 1934, 78 y.o.   MRN: [id number]  HPI HYPERTENSION: Creatinine 0.9 on 12/14/0 Disease Monitoring: Blood pressure range-usually < 130/68-70 Chest pain- no Palpitations- chronic X years; @ rest better with decreased coffee intake       Dyspnea-no Medications: Compliance- yes  Lightheadedness,Syncope- no   Edema- occasionally after sitting  DIABETES: A1c 6.1% on 12/14 Disease Monitoring: Blood Sugar ranges-109-150 Polyuria/phagia/dipsia- no       Visual problems- no Medications: Compliance- yes  Hypoglycemic symptoms-no Ophth exam: 5/12, no retinopathy Podiatry: 1/14   HYPERLIPIDEMIA: LDL 51, HDL 65 on 12/14/19  Disease Monitoring: See symptoms for Hypertension Medications: Compliance- yes  Abd pain, bowel changes- no , occasional constipation  Muscle aches- no          Review of Systems   She has instability of the knee with unstable gait. She purchased a cane at [employer].  Sertraline has been effective in controlling anxiety and depression. She is concerned about equivocal changes on her mammogram 12/27/11. A cyst was felt to be present after extensive evaluation. Mammogram was ordered by her gynecologist Dr. Toney Rakes     Objective:   Physical Exam Gen.:  well-nourished in appearance. Alert, appropriate and cooperative throughout exam. Weight excess Eyes: No corneal or conjunctival inflammation noted.  Extraocular motion intact.  Neck: No deformities, masses, or tenderness noted. Range of motion & Thyroid normal. Lungs: Normal respiratory effort; chest expands symmetrically. Lungs are clear to auscultation without rales, wheezes, or increased work of breathing. Heart: Normal rate and rhythm. Normal S1 and S2. No gallop, click, or rub. Grade 1/6 systolic murmur . No extra beats                                                                                  Musculoskeletal/extremities:  No  clubbing, cyanosis or deformity noted. Lipedema of ankles .Joints normal. Nail health  good. Vascular: Carotid, radial artery, dorsalis pedis and  posterior tibial pulses are full and equal. No bruits present. Neurologic: Alert and oriented x3. Deep tendon reflexes symmetrical but 1/2+ @ knees .Light touch normal over feet       Skin: Intact without suspicious lesions or rashes. Lymph: No cervical, axillary lymphadenopathy present. Psych: Mood and affect are normal. Normally interactive                                                                                         Assessment & Plan:

## 2012-01-17 NOTE — Assessment & Plan Note (Signed)
Blood pressure is well controlled; no change in medicines.

## 2012-01-17 NOTE — Assessment & Plan Note (Signed)
Lipids are at goal; no change in medications recommended. LDL is ideal with value  less than 70.

## 2012-01-17 NOTE — Assessment & Plan Note (Signed)
A1 C. is now in the borderline range for diabetes at 6.1%. Janumet  will be decreased to one half pill daily with largest meal.

## 2012-01-28 ENCOUNTER — Ambulatory Visit: Payer: MEDICARE | Attending: Internal Medicine | Admitting: Physical Therapy

## 2012-01-28 DIAGNOSIS — M6281 Muscle weakness (generalized): Secondary | ICD-10-CM | POA: Insufficient documentation

## 2012-01-28 DIAGNOSIS — IMO0001 Reserved for inherently not codable concepts without codable children: Secondary | ICD-10-CM | POA: Insufficient documentation

## 2012-01-28 DIAGNOSIS — R269 Unspecified abnormalities of gait and mobility: Secondary | ICD-10-CM | POA: Insufficient documentation

## 2012-02-04 ENCOUNTER — Ambulatory Visit: Payer: MEDICARE | Attending: Internal Medicine | Admitting: Physical Therapy

## 2012-02-04 DIAGNOSIS — M6281 Muscle weakness (generalized): Secondary | ICD-10-CM | POA: Insufficient documentation

## 2012-02-04 DIAGNOSIS — IMO0001 Reserved for inherently not codable concepts without codable children: Secondary | ICD-10-CM | POA: Insufficient documentation

## 2012-02-04 DIAGNOSIS — R269 Unspecified abnormalities of gait and mobility: Secondary | ICD-10-CM | POA: Insufficient documentation

## 2012-02-07 ENCOUNTER — Ambulatory Visit: Payer: MEDICARE | Admitting: Physical Therapy

## 2012-02-11 ENCOUNTER — Ambulatory Visit: Payer: MEDICARE | Admitting: Physical Therapy

## 2012-02-14 ENCOUNTER — Ambulatory Visit: Payer: MEDICARE | Admitting: Physical Therapy

## 2012-02-18 ENCOUNTER — Ambulatory Visit: Payer: MEDICARE | Admitting: Physical Therapy

## 2012-02-21 ENCOUNTER — Ambulatory Visit: Payer: BC Managed Care – PPO | Admitting: Physical Therapy

## 2012-02-22 ENCOUNTER — Ambulatory Visit: Payer: BC Managed Care – PPO | Admitting: Physical Therapy

## 2012-02-25 ENCOUNTER — Ambulatory Visit: Payer: MEDICARE | Admitting: Physical Therapy

## 2012-02-28 ENCOUNTER — Ambulatory Visit: Payer: MEDICARE | Admitting: Physical Therapy

## 2012-03-04 ENCOUNTER — Ambulatory Visit: Payer: MEDICARE | Attending: Internal Medicine | Admitting: Physical Therapy

## 2012-03-04 DIAGNOSIS — R269 Unspecified abnormalities of gait and mobility: Secondary | ICD-10-CM | POA: Insufficient documentation

## 2012-03-04 DIAGNOSIS — M6281 Muscle weakness (generalized): Secondary | ICD-10-CM | POA: Insufficient documentation

## 2012-03-04 DIAGNOSIS — IMO0001 Reserved for inherently not codable concepts without codable children: Secondary | ICD-10-CM | POA: Insufficient documentation

## 2012-03-06 ENCOUNTER — Ambulatory Visit: Payer: MEDICARE | Admitting: Physical Therapy

## 2012-03-11 ENCOUNTER — Ambulatory Visit: Payer: MEDICARE | Admitting: Physical Therapy

## 2012-03-14 ENCOUNTER — Ambulatory Visit: Payer: MEDICARE | Admitting: Physical Therapy

## 2012-03-17 ENCOUNTER — Ambulatory Visit: Payer: MEDICARE | Admitting: Physical Therapy

## 2012-03-18 ENCOUNTER — Ambulatory Visit: Payer: BC Managed Care – PPO | Admitting: Physical Therapy

## 2012-03-20 ENCOUNTER — Ambulatory Visit: Payer: BC Managed Care – PPO | Admitting: Physical Therapy

## 2012-03-21 ENCOUNTER — Ambulatory Visit: Payer: MEDICARE | Admitting: Physical Therapy

## 2012-03-25 ENCOUNTER — Ambulatory Visit: Payer: MEDICARE | Admitting: Physical Therapy

## 2012-03-27 ENCOUNTER — Ambulatory Visit: Payer: BC Managed Care – PPO | Admitting: Physical Therapy

## 2012-03-27 ENCOUNTER — Ambulatory Visit: Payer: MEDICARE | Admitting: Physical Therapy

## 2012-05-09 ENCOUNTER — Other Ambulatory Visit: Payer: Self-pay | Admitting: *Deleted

## 2012-05-09 MED ORDER — LOSARTAN POTASSIUM 100 MG PO TABS: 100.0000 mg | ORAL_TABLET | Freq: Every day | ORAL | Status: DC

## 2012-05-09 MED ORDER — METOPROLOL TARTRATE 100 MG PO TABS: 100.0000 mg | ORAL_TABLET | Freq: Two times a day (BID) | ORAL | Status: DC

## 2012-05-09 NOTE — Telephone Encounter (Signed)
Rx sent 

## 2012-06-25 ENCOUNTER — Encounter: Payer: Self-pay | Admitting: Gynecology

## 2012-07-09 ENCOUNTER — Other Ambulatory Visit (INDEPENDENT_AMBULATORY_CARE_PROVIDER_SITE_OTHER): Payer: MEDICARE

## 2012-07-09 DIAGNOSIS — E119 Type 2 diabetes mellitus without complications: Secondary | ICD-10-CM

## 2012-07-09 LAB — LIPID PANEL
Cholesterol: 156 mg/dL (ref 0–200)
LDL Cholesterol: 59 mg/dL (ref 0–99)
Total CHOL/HDL Ratio: 2
Triglycerides: 165 mg/dL — ABNORMAL HIGH (ref 0.0–149.0)

## 2012-07-09 LAB — BASIC METABOLIC PANEL
BUN: 35 mg/dL — ABNORMAL HIGH (ref 6–23)
Calcium: 10.2 mg/dL (ref 8.4–10.5)
Chloride: 97 mEq/L (ref 96–112)
Creatinine, Ser: 1.2 mg/dL (ref 0.4–1.2)

## 2012-07-09 LAB — MICROALBUMIN / CREATININE URINE RATIO
Creatinine,U: 144.8 mg/dL
Microalb Creat Ratio: 0.3 mg/g (ref 0.0–30.0)
Microalb, Ur: 0.5 mg/dL (ref 0.0–1.9)

## 2012-07-09 NOTE — Progress Notes (Signed)
Lab only 

## 2012-07-10 ENCOUNTER — Other Ambulatory Visit: Payer: Self-pay | Admitting: *Deleted

## 2012-07-10 DIAGNOSIS — E119 Type 2 diabetes mellitus without complications: Secondary | ICD-10-CM

## 2012-07-10 MED ORDER — SITAGLIPTIN PHOS-METFORMIN HCL 50-1000 MG PO TABS: 1.0000 | ORAL_TABLET | ORAL | Status: DC

## 2012-07-10 MED ORDER — METOPROLOL TARTRATE 100 MG PO TABS: 100.0000 mg | ORAL_TABLET | Freq: Two times a day (BID) | ORAL | Status: DC

## 2012-07-10 MED ORDER — LOSARTAN POTASSIUM 100 MG PO TABS: 100.0000 mg | ORAL_TABLET | Freq: Every day | ORAL | Status: DC

## 2012-07-10 MED ORDER — SERTRALINE HCL 100 MG PO TABS: 100.0000 mg | ORAL_TABLET | Freq: Every day | ORAL | Status: DC

## 2012-07-10 NOTE — Telephone Encounter (Signed)
Refill request:  Metoprolol 100mg  #180 with zero refills.  Janumet 50-1000mg  #90 with zero refills.  Sertraline 100mg  #90 with 1 refill.  Losartan 100mg  #90 with zero refills.   Refill done.

## 2012-08-11 ENCOUNTER — Ambulatory Visit: Payer: MEDICARE | Admitting: Internal Medicine

## 2012-08-15 ENCOUNTER — Ambulatory Visit (INDEPENDENT_AMBULATORY_CARE_PROVIDER_SITE_OTHER): Payer: MEDICARE | Admitting: Internal Medicine

## 2012-08-15 ENCOUNTER — Encounter: Payer: Self-pay | Admitting: Internal Medicine

## 2012-08-15 VITALS — BP 128/80 | HR 72 | Temp 97.7°F | Wt 261.8 lb

## 2012-08-15 DIAGNOSIS — E785 Hyperlipidemia, unspecified: Secondary | ICD-10-CM

## 2012-08-15 DIAGNOSIS — I10 Essential (primary) hypertension: Secondary | ICD-10-CM

## 2012-08-15 MED ORDER — SITAGLIPTIN PHOS-METFORMIN HCL 50-1000 MG PO TABS: ORAL_TABLET | ORAL | Status: DC

## 2012-08-15 MED ORDER — LOSARTAN POTASSIUM 100 MG PO TABS: 100.0000 mg | ORAL_TABLET | Freq: Every day | ORAL | Status: DC

## 2012-08-15 MED ORDER — METOPROLOL TARTRATE 100 MG PO TABS: 100.0000 mg | ORAL_TABLET | Freq: Two times a day (BID) | ORAL | Status: DC

## 2012-08-15 MED ORDER — SERTRALINE HCL 100 MG PO TABS: 100.0000 mg | ORAL_TABLET | Freq: Every day | ORAL | Status: DC

## 2012-08-15 MED ORDER — ATORVASTATIN CALCIUM 20 MG PO TABS: ORAL_TABLET | ORAL | Status: DC

## 2012-08-15 NOTE — Patient Instructions (Addendum)
Please  schedule fasting Labs in 3 months : BMET,Lipids,A1c. PLEASE BRING THESE INSTRUCTIONS TO FOLLOW UP  LAB APPOINTMENT.This will guarantee correct labs are drawn, eliminating need for repeat blood sampling ( needle sticks ! ). Diagnoses /Codes: 272.4,995.20,250.02.   If you activate My Chart; the results can be released to you as soon as they populate from the lab. If you choose not to use this program; the labs have to be reviewed, copied & mailed   causing a delay in getting the results to you.

## 2012-08-15 NOTE — Assessment & Plan Note (Signed)
Janumet 50/1000 will be taken one half pill twice a day with followup A1c in 3 months

## 2012-08-15 NOTE — Assessment & Plan Note (Signed)
Increase in triglycerides suggest suboptimal nutritional changes. Her LDL is only 59. Lipitor will be decreased to 20 mg one half pill Monday, Wednesday, Friday, and Sunday. Nutritional interventions discussed

## 2012-08-15 NOTE — Assessment & Plan Note (Signed)
Blood pressure is well controlled, but there's been a slight drop in the GFR despite a normal urine microalbumin. Blood pressure goals discussed

## 2012-08-15 NOTE — Progress Notes (Signed)
Subjective:    Patient ID: Katelyn Mahoney, female    DOB: 1934/04/05, 78 y.o.   MRN: [id number]  HPI She returns for assessment of her diabetes, hypertension, and dyslipidemia. In December 2012 Janumet 50/1000 was decreased from 1 twice a day to one half pill twice a day. Apparently she misunderstood and has only been taking half pill once a day. This change was made because her A1c in December was 6.1% which would be an average sugar 129 , associated with a 22 % long-term risk. Her A1c is now 7.5% which would correlate with an average sugar of  169 and risk of 50% associated with this diarrhea. Her triglycerides have increased from 133 in December to a value of 165; her LDL is 59 on 20 mg of Lipitor half a pill a day. Her blood pressure has been well-controlled (see below); creatinine has increased from 0.9-1.2 and GFR is dropped from 61.70-45.17. Her urine microalbumin however is normal.     Review of Systems HYPERTENSION: Disease Monitoring: Blood pressure range-120-130/60-65  Chest pain, palpitations- rare palpitations      Dyspnea- yes with weight gain.  Medications: Compliance- yes  Lightheadedness,Syncope- no    Edema- RLE occasionally   DIABETES: Disease Monitoring: Blood Sugar ranges-161-180+  Polyuria/phagia/dipsia- no       Visual problems- blurred with high glucose; neg Ophth exam 06/02/12 Medications: Compliance- see above Hypoglycemic symptoms- no   HYPERLIPIDEMIA: Disease Monitoring: See symptoms for Hypertension Medications: Compliance- yes  Abd pain, bowel changes-no   Muscle aches-occasionally in legs         Objective:   Physical Exam Gen.:  well-nourished in appearance. Alert, appropriate and cooperative throughout exam. Eyes: No corneal or conjunctival inflammation noted. Neck: No deformities, masses, or tenderness noted.  Thyroid normal Lungs: Normal respiratory effort; chest expands symmetrically. Lungs are clear to auscultation without rales,  wheezes, or increased work of breathing. Heart: Normal rate and rhythm. Normal S1 and S2. No gallop, click, or rub. Grade 0000000 over 6 systolic murmur . Abdomen: Bowel sounds normal; abdomen soft and nontender. No masses, organomegaly or hernias noted.                                                                       Musculoskeletal/extremities:   No clubbing, cyanosis,  or deformity noted. Joints : minimal DIP changes. Nail health  good.Lipidema Vascular: Carotid, radial artery, dorsalis pedis and  posterior tibial pulses are full and equal. No bruits present. Neurologic: Alert and oriented x3. Deep tendon reflexes symmetrical and 0-1/2+ . Light touch normal over feet.         Skin: Intact without suspicious lesions or rashes. Lymph: No cervical, axillary lymphadenopathy present. Psych: Mood and affect are normal. Normally interactive  Assessment & Plan:

## 2012-09-30 ENCOUNTER — Ambulatory Visit (INDEPENDENT_AMBULATORY_CARE_PROVIDER_SITE_OTHER): Payer: MEDICARE

## 2012-09-30 DIAGNOSIS — Z23 Encounter for immunization: Secondary | ICD-10-CM

## 2012-11-17 ENCOUNTER — Encounter: Payer: Self-pay | Admitting: Internal Medicine

## 2012-11-17 ENCOUNTER — Telehealth: Payer: Self-pay | Admitting: Internal Medicine

## 2012-11-17 ENCOUNTER — Other Ambulatory Visit (INDEPENDENT_AMBULATORY_CARE_PROVIDER_SITE_OTHER): Payer: MEDICARE

## 2012-11-17 DIAGNOSIS — E785 Hyperlipidemia, unspecified: Secondary | ICD-10-CM

## 2012-11-17 DIAGNOSIS — T887XXA Unspecified adverse effect of drug or medicament, initial encounter: Secondary | ICD-10-CM

## 2012-11-17 LAB — BASIC METABOLIC PANEL
CO2: 28 mEq/L (ref 19–32)
Calcium: 9.6 mg/dL (ref 8.4–10.5)
Chloride: 99 mEq/L (ref 96–112)
Glucose, Bld: 180 mg/dL — ABNORMAL HIGH (ref 70–99)
Potassium: 4.7 mEq/L (ref 3.5–5.1)
Sodium: 133 mEq/L — ABNORMAL LOW (ref 135–145)

## 2012-11-17 LAB — LIPID PANEL
HDL: 53.5 mg/dL (ref >39.00)
LDL Cholesterol: 90 mg/dL (ref 0–99)
Total CHOL/HDL Ratio: 3
Triglycerides: 176 mg/dL — ABNORMAL HIGH (ref 0.0–149.0)
VLDL: 35.2 mg/dL (ref 0.0–40.0)

## 2012-11-17 LAB — HEMOGLOBIN A1C: Hgb A1c MFr Bld: 6.6 % — ABNORMAL HIGH (ref 4.6–6.5)

## 2012-11-17 MED ORDER — LOSARTAN POTASSIUM 100 MG PO TABS: 100.0000 mg | ORAL_TABLET | Freq: Every day | ORAL | Status: DC

## 2012-11-17 NOTE — Telephone Encounter (Signed)
RX sent in

## 2012-11-17 NOTE — Telephone Encounter (Signed)
Refill: Losartan 100 mg #90. Take 1 tablet by mouth daily.

## 2012-12-16 ENCOUNTER — Ambulatory Visit (INDEPENDENT_AMBULATORY_CARE_PROVIDER_SITE_OTHER): Payer: MEDICARE | Admitting: Internal Medicine

## 2012-12-16 VITALS — BP 128/80 | HR 82 | Wt 257.2 lb

## 2012-12-16 DIAGNOSIS — I10 Essential (primary) hypertension: Secondary | ICD-10-CM

## 2012-12-16 DIAGNOSIS — E785 Hyperlipidemia, unspecified: Secondary | ICD-10-CM

## 2012-12-16 MED ORDER — FUROSEMIDE 40 MG PO TABS: 40.0000 mg | ORAL_TABLET | Freq: Every day | ORAL | Status: DC

## 2012-12-16 MED ORDER — SITAGLIPTIN PHOS-METFORMIN HCL 50-1000 MG PO TABS: ORAL_TABLET | ORAL | Status: DC

## 2012-12-16 MED ORDER — LOSARTAN POTASSIUM 100 MG PO TABS: 100.0000 mg | ORAL_TABLET | Freq: Every day | ORAL | Status: DC

## 2012-12-16 MED ORDER — ATORVASTATIN CALCIUM 20 MG PO TABS: ORAL_TABLET | ORAL | Status: DC

## 2012-12-16 MED ORDER — GLUCOSE BLOOD VI STRP: ORAL_STRIP | Status: DC

## 2012-12-16 MED ORDER — METOPROLOL TARTRATE 100 MG PO TABS: 100.0000 mg | ORAL_TABLET | Freq: Two times a day (BID) | ORAL | Status: DC

## 2012-12-16 MED ORDER — ONETOUCH DELICA LANCETS MISC: Status: DC

## 2012-12-16 MED ORDER — SERTRALINE HCL 100 MG PO TABS: 100.0000 mg | ORAL_TABLET | Freq: Every day | ORAL | Status: DC

## 2012-12-16 NOTE — Patient Instructions (Addendum)
Please  schedule fasting Labs  In 4 mos: Lipids, A1c, urine microalbumin. PLEASE BRING THESE INSTRUCTIONS TO FOLLOW UP  LAB APPOINTMENT.This will guarantee correct labs are drawn, eliminating need for repeat blood sampling ( needle sticks ! ). Diagnoses /Codes: 250.00, 401.9,277.7.  The most common cause of elevated triglycerides is the ingestion of sugar from high fructose corn syrup sources added to processed foods & drinks.  Eat a low-fat diet with lots of fruits and vegetables, up to 7-9 servings per day. Consume less than 30 (preferably ZERO)grams of sugar per day from foods & drinks with High Fructose Corn Syrup (HFCS) sugar as #1,2,3 or # 4 on label.Whole Foods, [employer] [place] do not carry products with HFCS. Follow a  low carb nutrition program such as [place] or The New Sugar Busters  to prevent Diabetes progression . White carbohydrates (potatoes, rice, bread, and pasta) have a high spike of sugar and a high load of sugar. For example a  baked potato has a cup of sugar and a  french fry  2 teaspoons of sugar. Yams, wild  rice, whole grained bread &  wheat pasta have been much lower spike and load of  sugar. Portions should be the size of a deck of cards or your palm.  Blood Pressure Goal = AVERAGE < 140/90;  Ideally  < 135/85. This AVERAGE should be calculated from @ least 5-7 BP readings taken @ different times of day on different days of week. You should not respond to isolated BP readings , but rather the AVERAGE for that week

## 2012-12-16 NOTE — Assessment & Plan Note (Signed)
Hypertension appears to be well controlled; I have encouraged her to record an average. The doses of her medications could possibly be decreased if she attains goal.  There is minimal renal function impairment; she should stay well hydrated drinking to thirst, up to 32 ounces of fluid a day

## 2012-12-16 NOTE — Progress Notes (Signed)
Subjective:    Patient ID: Lorretta Harp, female    DOB: 1934-06-17, 78 y.o.   MRN: [id number]  HPI The patient is here for followup of diabetes, hyperlipidemia, and hypertension.  The most recent A1c 11/17/12  Was 6.6 ( down from 7.5 %)  , which correlates to an average sugar of 143   , and long-term risk of 32 % . Fasting blood sugar   average is160   .  Two-hour postprandial glucose is not checked  . No hypoglycemia Last ophthalmologic examination < 12 mos  revealed no retinopathy. Active podiatry assessment on record. Diet is low carb . Exercise is minimal .  The most recent lipids 11/18   reveal LDL 90  , HDL 53.5  , and triglycerides  176  . There is medical compliance with the statin.  Blood pressure range  109/47- 150/66   . There is medical compliance with antihypertensive medications        Review of Systems Constitutional: No  significant weight change, fatigue, weakness or night sweats Eyes: No  blurred vision, double vision, or loss of vision Cardiovascular: no chest pain but occasional palpitations &  Irregular  rhythm,syncope, claudication. Some  edema  Respiratory: No exertinal dyspnea, paroxysmal nocturnal dyspnea Musculoskeletal: No myalgias or muscle cramping  o weakness Dermatologic: No  change in color or temperature of skin Neurologic: No  numbness or tingling Endocrine: No change in skin/ nails, excessive thirst, excessive hunger, excessive urination. Some hair loss      Objective:   Physical Exam Gen.: well-nourished in appearance. Alert, appropriate and cooperative throughout exam. Eyes: No corneal or conjunctival inflammation noted.  Nose: External nasal exam reveals no deformity or inflammation. Nasal mucosa are pink and moist. No lesions or exudates noted.  Mouth: Oral mucosa and oropharynx reveal no lesions or exudates. Upper plate & lower partial. Neck: No deformities, masses, or tenderness noted.  Thyroid normal. Lungs: Normal respiratory  effort; chest expands symmetrically. Lungs are clear to auscultation without rales, wheezes, or increased work of breathing. Heart: Normal rate and rhythm. Normal S1 and S2. No gallop, click, or rub. Grade 1/2 over 6 systolic murmur . Abdomen: Bowel sounds normal; abdomen soft and nontender. No masses, organomegaly or hernias noted.                                                                                   Musculoskeletal/extremities:  No clubbing, cyanosis present. 1/2 + edema. Minor DIP osteoarthritic finger changes . Nail health  good. Vascular: Carotid & radial artery are full and equal. Slightly decreased dorsalis pedis and  posterior tibial pulses No bruits present. Neurologic: Alert and oriented x3. Deep tendon reflexes symmetrical and 0-1/2+.  Light touch normal over feet.        Skin: Intact without suspicious lesions or rashes. Lymph: No cervical, axillary lymphadenopathy present. Psych: Mood and affect are normal. Normally interactive  Assessment & Plan:

## 2012-12-16 NOTE — Assessment & Plan Note (Signed)
Diabetes control is significantly improved with the risk reduction of almost 20% compared to 07/09/12.  No change indicated; dietary interventions recommended for triglyceride control. Recheck A1c in 4 months

## 2012-12-16 NOTE — Assessment & Plan Note (Signed)
LDL is at goal; nutritional interventions for triglyceride control discussed

## 2013-02-24 ENCOUNTER — Telehealth: Payer: Self-pay | Admitting: Internal Medicine

## 2013-02-24 ENCOUNTER — Encounter: Payer: Self-pay | Admitting: Internal Medicine

## 2013-02-24 ENCOUNTER — Ambulatory Visit (INDEPENDENT_AMBULATORY_CARE_PROVIDER_SITE_OTHER): Payer: MEDICARE | Admitting: Internal Medicine

## 2013-02-24 VITALS — BP 130/70 | HR 73 | Temp 99.6°F | Wt 257.0 lb

## 2013-02-24 DIAGNOSIS — J069 Acute upper respiratory infection, unspecified: Secondary | ICD-10-CM

## 2013-02-24 DIAGNOSIS — J029 Acute pharyngitis, unspecified: Secondary | ICD-10-CM

## 2013-02-24 DIAGNOSIS — R509 Fever, unspecified: Secondary | ICD-10-CM

## 2013-02-24 LAB — POCT RAPID STREP A (OFFICE): Rapid Strep A Screen: NEGATIVE

## 2013-02-24 MED ORDER — DOXYCYCLINE HYCLATE 100 MG PO TABS: 100.0000 mg | ORAL_TABLET | Freq: Two times a day (BID) | ORAL | Status: DC

## 2013-02-24 NOTE — Progress Notes (Signed)
  Subjective:    Patient ID: Katelyn Mahoney, female    DOB: December 20, 1934, 79 y.o.   MRN: [id number]  HPI The respiratory tract symptoms began 02/23/13 as mild sore throat ,rhinitis, head congestion w/o chest congestion, or cough .  No  exposures reported to sick family, friends, allergens, or environmental factors as triggers .  Significant active  associated symptoms include frontal headache,facial pain, dental pain,   nasal purulence, & earache w/o otic discharge. Low grade fever, chills,&  sweats were present   Myalgias and arthralgias were  present. Flu shot current  Treatment with Zicam,  ASA, Tylenol was partially effective. There is no history of asthma ,  seasonal or perennial allergies.  The patient had never smoked                   Review of Systems No cough ,wheezing or dyspnea reported.  Extrinsic symptoms of itchy/ watery eyes &  sneezing were present last week only.        Objective:   Physical Exam General appearance:good health ;well nourished; no acute distress or increased work of breathing is present.   Eyes: No conjunctival inflammation or lid edema is present. EOMI & vision normal with lenses Ears:  External ear exam shows no significant lesions or deformities.  Otoscopic examination reveals clear canals, tympanic membranes are intact bilaterally without bulging, retraction, inflammation or discharge. Nose:  External nasal examination shows no deformity or inflammation. Nasal mucosa are pink and moist without lesions or exudates. No septal dislocation or deviation.No obstruction to airflow.  Oral exam: Upper denture & lower partial; lips and gums are healthy appearing.There is no oropharyngeal erythema or exudate noted.  Neck:  No deformities, masses, or tenderness noted.     Heart:  Normal rate and regular rhythm. S1 and S2 normal without gallop, click, rub or murmur.  S4 Lungs:Chest clear to auscultation; no wheezes, rhonchi,rales ,or rubs  present.No increased work of breathing.   Extremities:  No cyanosis, edema, or clubbing  noted  No  lymphadenopathy about the head, neck, or axilla noted.  Skin: Warm & dry        Assessment & Plan:  #1 pharyngitis #2 URI Plan: See orders and recommendations

## 2013-02-24 NOTE — Telephone Encounter (Signed)
Patient Information:  Caller Name: [place]  Phone: [phone]  Patient: Katelyn Mahoney, Katelyn Mahoney  Gender: Female  DOB: 1934-09-17  Age: 79 Years  PCP: Unice Cobble   Does the office need to follow up with this patient?: No    Reason For Call & Symptoms: sore throat, T 100.3; aching; calling for appointment  Reviewed Health History In EMR: Yes  Reviewed Medications In EMR: Yes  Reviewed Allergies In EMR: Yes  Reviewed Surgeries / Procedures: Yes  Date of Onset of Symptoms: 02/23/2013  Guideline(s) Used:  No Protocol Available - Sick Adult  Disposition Per Guideline:  See Today in Office  Reason For Disposition Reached:  Patient wants to be seen  Advice Given:  Call Back If:  You become worse.  Appointment Scheduled:  02/24/2013 16:00:00  Scheduled Provider:  Unice Cobble

## 2013-02-24 NOTE — Telephone Encounter (Signed)
Patient scheduled to be seen today at 4 pm, per Dr.Hopper's protocol if patient with pending appointment ok to close encounter

## 2013-02-24 NOTE — Patient Instructions (Addendum)
Plain Mucinex (NOT D) for thick secretions ;force NON dairy fluids .   Nasal cleansing in the shower as discussed with lather of mild shampoo.After 10 seconds wash off lather while  exhaling through nostrils. Make sure that all residual soap is removed to prevent irritation.  Use a Neti pot daily only  as needed for significant sinus congestion; going from open side to congested side . Plain Allegra (NOT D )  160 daily , Loratidine 10 mg , OR Zyrtec 10 mg @ bedtime  as needed for itchy eyes & sneezing.  Zicam Melts or Zinc lozenges for scratchy throay ; vitamin C 2000 mg daily; & Echinacea for 4-7 days.

## 2013-04-07 ENCOUNTER — Ambulatory Visit (INDEPENDENT_AMBULATORY_CARE_PROVIDER_SITE_OTHER): Payer: Medicare Other | Admitting: Family Medicine

## 2013-04-07 VITALS — BP 127/75 | HR 83 | Temp 98.8°F | Resp 17 | Ht 67.0 in | Wt 258.0 lb

## 2013-04-07 DIAGNOSIS — R300 Dysuria: Secondary | ICD-10-CM

## 2013-04-07 DIAGNOSIS — N3091 Cystitis, unspecified with hematuria: Secondary | ICD-10-CM

## 2013-04-07 DIAGNOSIS — R319 Hematuria, unspecified: Secondary | ICD-10-CM

## 2013-04-07 DIAGNOSIS — N3090 Cystitis, unspecified without hematuria: Secondary | ICD-10-CM

## 2013-04-07 DIAGNOSIS — E119 Type 2 diabetes mellitus without complications: Secondary | ICD-10-CM

## 2013-04-07 LAB — POCT URINALYSIS DIPSTICK
Nitrite, UA: NEGATIVE
Protein, UA: 100
Urobilinogen, UA: 0.2
pH, UA: 6.5

## 2013-04-07 LAB — POCT UA - MICROSCOPIC ONLY
Crystals, Ur, HPF, POC: NEGATIVE
Yeast, UA: NEGATIVE

## 2013-04-07 MED ORDER — CIPROFLOXACIN HCL 500 MG PO TABS: 500.0000 mg | ORAL_TABLET | Freq: Two times a day (BID) | ORAL | Status: DC

## 2013-04-07 MED ORDER — PHENAZOPYRIDINE HCL 100 MG PO TABS: 100.0000 mg | ORAL_TABLET | Freq: Three times a day (TID) | ORAL | Status: DC | PRN

## 2013-04-07 NOTE — Progress Notes (Signed)
[place],   [address]   [phone]  Subjective:    Patient ID: Katelyn Mahoney, female    DOB: May 16, 1934, 79 y.o.   MRN: [id number]  HPI This 79 y.o. female presents for evaluation of UTI.  Hematuria onset today for past eight hours.  Started drinking water with some improvement.  No fever but +chills.  No sweats.  No nausea or vomiting.  +back pain chronic with worsening today.  Usually does not hurt with sitting but has today.  +dysuria.  +frequency +urgency.  No abdominal pain.  No vaginal bleeding; no bloody stools.  History of UTI in past; no recent UTI "since old".  No history of kidney stones.  Sugar 186 this morning; this afternoon 144.  Has suffered with urinary incontinence for past several weeks; left Poise pad on all night.  Denies chest pain, SOB, orthopnea, worsening leg swelling.  Has appointment with PCP next week.  PCP:  Linna Darner    Review of Systems  Constitutional: Positive for chills. Negative for fever, diaphoresis and fatigue.  Respiratory: Negative for shortness of breath.   Cardiovascular: Negative for chest pain, palpitations and leg swelling.  Gastrointestinal: Negative for nausea, vomiting and diarrhea.  Genitourinary: Positive for dysuria, urgency, frequency and hematuria. Negative for flank pain, decreased urine volume, vaginal bleeding, vaginal discharge, genital sores and pelvic pain.  Musculoskeletal: Positive for back pain.        Past Medical History  Diagnosis Date  . Allergy   . Arthritis     Past Surgical History  Procedure Laterality Date  . Cholecystectomy    . Abdominal hysterectomy      Prior to Admission medications   Medication Sig Start Date End Date Taking? Authorizing Provider  acetaminophen (TYLENOL ARTHRITIS PAIN) 650 MG CR tablet Take 650 mg by mouth. 2 by mouth once daily   Yes Historical Provider, MD  AMBULATORY NON FORMULARY MEDICATION Medication Name: Shark Cartilage 750 mg 3 by mouth two times  daily   Yes Historical Provider, MD  aspirin 81 MG tablet Take 81 mg by mouth. 3 by mouth in the evening   Yes Historical Provider, MD  atorvastatin (LIPITOR) 20 MG tablet 1/2 by mouth M,W, F & Sun 12/16/12  Yes Hendricks Limes, MD  Bilberry, Vaccinium myrtillus, (BILBERRY PO) Take 470 mg by mouth. 2 by mouth daily   Yes Historical Provider, MD  doxycycline (VIBRA-TABS) 100 MG tablet Take 1 tablet (100 mg total) by mouth 2 (two) times daily. 02/24/13  Yes Hendricks Limes, MD  furosemide (LASIX) 40 MG tablet Take 1 tablet (40 mg total) by mouth daily. 12/16/12  Yes Hendricks Limes, MD  glucose blood (ONE TOUCH TEST STRIPS) test strip 1 each by Other route daily. 250.02 Ultra blue 12/16/12  Yes Hendricks Limes, MD  losartan (COZAAR) 100 MG tablet Take 1 tablet (100 mg total) by mouth daily. 12/16/12  Yes Hendricks Limes, MD  metoprolol (LOPRESSOR) 100 MG tablet Take 1 tablet (100 mg total) by mouth 2 (two) times daily. 12/16/12  Yes Hendricks Limes, MD  minocycline (MINOCIN,DYNACIN) 100 MG capsule Take 100 mg by mouth every other day. Only when breakout occurs (Roseacea)   Yes Historical Provider, MD  Multiple Vitamins-Minerals (HAIR-VITES PO) Take by mouth. Once daily   Yes Historical Provider, MD  [facility] LANCETS MISC Check blood sugar daily as directed DX:250.02 12/16/12  Yes Hendricks Limes, MD  sertraline (ZOLOFT) 100 MG tablet  Take 1 tablet (100 mg total) by mouth daily. 12/16/12 12/16/13 Yes Hendricks Limes, MD  sitaGLIPtan-metformin (JANUMET) 50-1000 MG per tablet 1/2 by mouth bid with meals 12/16/12  Yes Hendricks Limes, MD  ciprofloxacin (CIPRO) 500 MG tablet Take 1 tablet (500 mg total) by mouth 2 (two) times daily. 04/07/13   Wardell Honour, MD  phenazopyridine (PYRIDIUM) 100 MG tablet Take 1 tablet (100 mg total) by mouth 3 (three) times daily as needed for pain. 04/07/13   Wardell Honour, MD    Allergies  Allergen Reactions  . Bupropion Hcl     Palmar rash  . Penicillins      rash  . Amlodipine Besylate     Cough   . Citalopram Hydrobromide     History   Social History  . Marital Status: Widowed    Spouse Name: N/A    Number of Children: N/A  . Years of Education: N/A   Occupational History  . Not on file.   Social History Main Topics  . Smoking status: Never Smoker   . Smokeless tobacco: Not on file  . Alcohol Use: No  . Drug Use: No  . Sexually Active: No   Other Topics Concern  . Not on file   Social History Narrative  . No narrative on file    Family History  Problem Relation Age of Onset  . Cancer Mother   . Cancer Father   . Cancer Sister   . Cancer Brother     Objective:   Physical Exam  Nursing note and vitals reviewed. Constitutional: She is oriented to person, place, and time. She appears well-developed and well-nourished. No distress.  Cardiovascular: Normal rate, regular rhythm and normal heart sounds.   Pulmonary/Chest: Effort normal and breath sounds normal. She has no wheezes. She has no rales.  Abdominal: Soft. Bowel sounds are normal. She exhibits no distension. There is no tenderness. There is no rebound.  Neurological: She is alert and oriented to person, place, and time.  Skin: She is not diaphoretic.  Psychiatric: She has a normal mood and affect. Her behavior is normal.       Results for orders placed in visit on 04/07/13  POCT URINALYSIS DIPSTICK      Result Value Range   Color, UA pink     Clarity, UA cloudy     Glucose, UA neg     Bilirubin, UA neg     Ketones, UA neg     Spec Grav, UA 1.010     Blood, UA large     pH, UA 6.5     Protein, UA 100     Urobilinogen, UA 0.2     Nitrite, UA neg     Leukocytes, UA large (3+)    POCT UA - MICROSCOPIC ONLY      Result Value Range   WBC, Ur, HPF, POC tntc     RBC, urine, microscopic 10-15     Bacteria, U Microscopic 4+     Mucus, UA neg     Epithelial cells, urine per micros 0-5     Crystals, Ur, HPF, POC neg     Casts, Ur, LPF, POC renal  tubular     Yeast, UA neg      Assessment & Plan:  Burning with urination - Plan: POCT urinalysis dipstick, POCT UA - Microscopic Only, ciprofloxacin (CIPRO) 500 MG tablet, phenazopyridine (PYRIDIUM) 100 MG tablet, Urine culture  Hematuria - Plan: ciprofloxacin (CIPRO) 500  MG tablet, Urine culture  Hemorrhagic cystitis  Type II or unspecified type diabetes mellitus without mention of complication, not stated as uncontrolled    1.  Acute hemorrhagic cystitis:  New.  Send urine culture; treat with Cipro and Pyridium. RTC for fever, vomiting, flank pain, worsening hematuria.  Will warrant repeat u/a with PCP in upcoming 1-2 week to confirm that hematuria resolved. 2.  DMII: stable; monitor sugars closely with acute infection.  Meds ordered this encounter  Medications  . ciprofloxacin (CIPRO) 500 MG tablet    Sig: Take 1 tablet (500 mg total) by mouth 2 (two) times daily.    Dispense:  14 tablet    Refill:  0  . phenazopyridine (PYRIDIUM) 100 MG tablet    Sig: Take 1 tablet (100 mg total) by mouth 3 (three) times daily as needed for pain.    Dispense:  10 tablet    Refill:  0

## 2013-04-07 NOTE — Patient Instructions (Addendum)
Burning with urination - Plan: POCT urinalysis dipstick, POCT UA - Microscopic Only, ciprofloxacin (CIPRO) 500 MG tablet, phenazopyridine (PYRIDIUM) 100 MG tablet  Hematuria - Plan: ciprofloxacin (CIPRO) 500 MG tablet  Hemorrhagic cystitis  Type II or unspecified type diabetes mellitus without mention of complication, not stated as uncontrolled  Urinary Tract Infection Urinary tract infections (UTIs) can develop anywhere along your urinary tract. Your urinary tract is your body's drainage system for removing wastes and extra water. Your urinary tract includes two kidneys, two ureters, a bladder, and a urethra. Your kidneys are a pair of bean-shaped organs. Each kidney is about the size of your fist. They are located below your ribs, one on each side of your spine. CAUSES Infections are caused by microbes, which are microscopic organisms, including fungi, viruses, and bacteria. These organisms are so small that they can only be seen through a microscope. Bacteria are the microbes that most commonly cause UTIs. SYMPTOMS  Symptoms of UTIs may vary by age and gender of the patient and by the location of the infection. Symptoms in young women typically include a frequent and intense urge to urinate and a painful, burning feeling in the bladder or urethra during urination. Older women and men are more likely to be tired, shaky, and weak and have muscle aches and abdominal pain. A fever may mean the infection is in your kidneys. Other symptoms of a kidney infection include pain in your back or sides below the ribs, nausea, and vomiting. DIAGNOSIS To diagnose a UTI, your caregiver will ask you about your symptoms. Your caregiver also will ask to provide a urine sample. The urine sample will be tested for bacteria and white blood cells. White blood cells are made by your body to help fight infection. TREATMENT  Typically, UTIs can be treated with medication. Because most UTIs are caused by a bacterial  infection, they usually can be treated with the use of antibiotics. The choice of antibiotic and length of treatment depend on your symptoms and the type of bacteria causing your infection. HOME CARE INSTRUCTIONS  If you were prescribed antibiotics, take them exactly as your caregiver instructs you. Finish the medication even if you feel better after you have only taken some of the medication.  Drink enough water and fluids to keep your urine clear or pale yellow.  Avoid caffeine, tea, and carbonated beverages. They tend to irritate your bladder.  Empty your bladder often. Avoid holding urine for long periods of time.  Empty your bladder before and after sexual intercourse.  After a bowel movement, women should cleanse from front to back. Use each tissue only once. SEEK MEDICAL CARE IF:   You have back pain.  You develop a fever.  Your symptoms do not begin to resolve within 3 days. SEEK IMMEDIATE MEDICAL CARE IF:   You have severe back pain or lower abdominal pain.  You develop chills.  You have nausea or vomiting.  You have continued burning or discomfort with urination. MAKE SURE YOU:   Understand these instructions.  Will watch your condition.  Will get help right away if you are not doing well or get worse. Document Released: 09/26/2005 Document Revised: 06/17/2012 Document Reviewed: 01/25/2012 [facility] Patient Information 2013 [place].

## 2013-04-10 LAB — URINE CULTURE: Colony Count: 65000

## 2013-04-16 ENCOUNTER — Other Ambulatory Visit (INDEPENDENT_AMBULATORY_CARE_PROVIDER_SITE_OTHER): Payer: MEDICARE

## 2013-04-16 DIAGNOSIS — E119 Type 2 diabetes mellitus without complications: Secondary | ICD-10-CM

## 2013-04-16 DIAGNOSIS — E8881 Metabolic syndrome: Secondary | ICD-10-CM

## 2013-04-16 DIAGNOSIS — I10 Essential (primary) hypertension: Secondary | ICD-10-CM

## 2013-04-16 LAB — LIPID PANEL
Total CHOL/HDL Ratio: 3
Triglycerides: 211 mg/dL — ABNORMAL HIGH (ref 0.0–149.0)

## 2013-04-16 LAB — MICROALBUMIN / CREATININE URINE RATIO
Creatinine,U: 76.6 mg/dL
Microalb Creat Ratio: 0.4 mg/g (ref 0.0–30.0)

## 2013-04-16 LAB — HEMOGLOBIN A1C: Hgb A1c MFr Bld: 6.8 % — ABNORMAL HIGH (ref 4.6–6.5)

## 2013-04-17 ENCOUNTER — Encounter: Payer: Self-pay | Admitting: Internal Medicine

## 2013-04-17 DIAGNOSIS — M205X9 Other deformities of toe(s) (acquired), unspecified foot: Secondary | ICD-10-CM | POA: Insufficient documentation

## 2013-04-17 DIAGNOSIS — M2040 Other hammer toe(s) (acquired), unspecified foot: Secondary | ICD-10-CM | POA: Insufficient documentation

## 2013-04-30 ENCOUNTER — Ambulatory Visit (INDEPENDENT_AMBULATORY_CARE_PROVIDER_SITE_OTHER): Payer: Medicare Other | Admitting: Family Medicine

## 2013-04-30 VITALS — BP 103/73 | HR 76 | Temp 98.4°F | Resp 17 | Ht 67.5 in | Wt 258.0 lb

## 2013-04-30 DIAGNOSIS — N3091 Cystitis, unspecified with hematuria: Secondary | ICD-10-CM

## 2013-04-30 DIAGNOSIS — R319 Hematuria, unspecified: Secondary | ICD-10-CM

## 2013-04-30 DIAGNOSIS — N3090 Cystitis, unspecified without hematuria: Secondary | ICD-10-CM

## 2013-04-30 LAB — POCT URINALYSIS DIPSTICK
Bilirubin, UA: NEGATIVE
Blood, UA: NEGATIVE
Glucose, UA: NEGATIVE
Ketones, UA: NEGATIVE
Leukocytes, UA: NEGATIVE
Nitrite, UA: NEGATIVE
Spec Grav, UA: 1.01
Urobilinogen, UA: 0.2
pH, UA: 6.5

## 2013-04-30 LAB — POCT UA - MICROSCOPIC ONLY
Crystals, Ur, HPF, POC: NEGATIVE
Mucus, UA: NEGATIVE
RBC, urine, microscopic: NEGATIVE

## 2013-04-30 NOTE — Patient Instructions (Addendum)
YOUR URINE IS NORMAL.

## 2013-04-30 NOTE — Progress Notes (Signed)
[place], [place]  [address]   [phone]  Subjective:    Patient ID: Katelyn Mahoney, female    DOB: 10-25-1934, 79 y.o.   MRN: [id number]  HPI This 79 y.o. female presents for one month follow-up for hemorrhagic cystitis on 04/07/2013.  Prescribed Cipro; urine culture returned + for Proteus UTI.  Completed Cipro course.  Improved in 3-4 days.  No more hematuria.  Intermittent dysuria in early morning.  Remembering to drink more water. Trying to drink 32 ounces of water daily.  No fever/chills/sweats.  No urgency; no nocturia.   Review of Systems  Constitutional: Negative for chills, diaphoresis and fatigue.  Gastrointestinal: Negative for nausea, vomiting and abdominal pain.  Genitourinary: Negative for dysuria, urgency, hematuria and flank pain.   Past Medical History  Diagnosis Date  . Allergy   . Arthritis   . Diabetes mellitus without complication   . Hypertension    Current Outpatient Prescriptions on File Prior to Visit  Medication Sig Dispense Refill  . acetaminophen (TYLENOL ARTHRITIS PAIN) 650 MG CR tablet Take 650 mg by mouth. 2 by mouth once daily      . AMBULATORY NON FORMULARY MEDICATION Medication Name: Shark Cartilage 750 mg 3 by mouth two times daily      . aspirin 81 MG tablet Take 81 mg by mouth. 3 by mouth in the evening      . atorvastatin (LIPITOR) 20 MG tablet 1/2 by mouth M,W, F & Sun  90 tablet  1  . Bilberry, Vaccinium myrtillus, (BILBERRY PO) Take 470 mg by mouth. 2 by mouth daily      . furosemide (LASIX) 40 MG tablet Take 1 tablet (40 mg total) by mouth daily.  90 tablet  3  . glucose blood (ONE TOUCH TEST STRIPS) test strip 1 each by Other route daily. 250.02 Ultra blue  100 each  3  . losartan (COZAAR) 100 MG tablet Take 1 tablet (100 mg total) by mouth daily.  90 tablet  3  . metoprolol (LOPRESSOR) 100 MG tablet Take 1 tablet (100 mg total) by mouth 2 (two) times daily.  180 tablet  3  . Multiple Vitamins-Minerals (HAIR-VITES PO)  Take by mouth. Once daily      . ONETOUCH DELICA LANCETS MISC Check blood sugar daily as directed DX:250.02  100 each  3  . phenazopyridine (PYRIDIUM) 100 MG tablet Take 1 tablet (100 mg total) by mouth 3 (three) times daily as needed for pain.  10 tablet  0  . sertraline (ZOLOFT) 100 MG tablet Take 1 tablet (100 mg total) by mouth daily.  90 tablet  3  . sitaGLIPtan-metformin (JANUMET) 50-1000 MG per tablet 1/2 by mouth bid with meals  90 tablet  1  . ciprofloxacin (CIPRO) 500 MG tablet Take 1 tablet (500 mg total) by mouth 2 (two) times daily.  14 tablet  0  . doxycycline (VIBRA-TABS) 100 MG tablet Take 1 tablet (100 mg total) by mouth 2 (two) times daily.  20 tablet  0  . minocycline (MINOCIN,DYNACIN) 100 MG capsule Take 100 mg by mouth every other day. Only when breakout occurs (Roseacea)       No current facility-administered medications on file prior to visit.      Objective:   Physical Exam  Nursing note and vitals reviewed. Constitutional: She appears well-developed and well-nourished. No distress.  Cardiovascular: Normal rate and regular rhythm.   Pulmonary/Chest: Effort normal and breath sounds normal.  Skin:  She is not diaphoretic.  Psychiatric: She has a normal mood and affect. Her behavior is normal.       Results for orders placed in visit on 04/30/13  POCT URINALYSIS DIPSTICK      Result Value Range   Color, UA yellow     Clarity, UA clear     Glucose, UA neg     Bilirubin, UA neg     Ketones, UA neg     Spec Grav, UA 1.010     Blood, UA neg     pH, UA 6.5     Protein, UA trace     Urobilinogen, UA 0.2     Nitrite, UA neg     Leukocytes, UA Negative    POCT UA - MICROSCOPIC ONLY      Result Value Range   WBC, Ur, HPF, POC neg     RBC, urine, microscopic neg     Bacteria, U Microscopic neg     Mucus, UA neg     Epithelial cells, urine per micros 0-2     Crystals, Ur, HPF, POC neg     Casts, Ur, LPF, POC neg     Yeast, UA neg        Assessment & Plan:    Hematuria - Plan: POCT urinalysis dipstick, POCT UA - Microscopic Only  Hemorrhagic cystitis  1. Hematuria: now resolved.  2. Hemorrhagic Cystitis: improved; s/p Cipro x 7 days; urine culture positive; asymptomatic now.

## 2013-05-08 ENCOUNTER — Other Ambulatory Visit: Payer: Self-pay

## 2013-05-08 MED ORDER — SITAGLIPTIN PHOS-METFORMIN HCL 50-1000 MG PO TABS: ORAL_TABLET | ORAL | Status: DC

## 2013-06-11 ENCOUNTER — Other Ambulatory Visit: Payer: Self-pay | Admitting: *Deleted

## 2013-06-11 MED ORDER — ATORVASTATIN CALCIUM 20 MG PO TABS: ORAL_TABLET | ORAL | Status: DC

## 2013-06-11 NOTE — Telephone Encounter (Signed)
Rx sent 

## 2013-06-22 ENCOUNTER — Encounter: Payer: Self-pay | Admitting: Gynecology

## 2013-06-30 ENCOUNTER — Encounter: Payer: Self-pay | Admitting: Internal Medicine

## 2013-07-07 ENCOUNTER — Telehealth: Payer: Self-pay | Admitting: Internal Medicine

## 2013-07-07 NOTE — Telephone Encounter (Signed)
Please advise on MyChart request for tdap.  Appointment Request From: Lorretta Harp  With Provider: Unice Cobble, MD [-Primary Care Physician-]  Preferred Date Range: From 07/07/2013 To 07/30/2013  Preferred Times: Monday Afternoon, Tuesday Afternoon, Thursday Afternoon, Friday Afternoon  Reason: To address the following health maintenance concerns. Tetanus/Tdap

## 2013-07-09 ENCOUNTER — Encounter: Payer: Self-pay | Admitting: Internal Medicine

## 2013-09-10 ENCOUNTER — Telehealth: Payer: Self-pay | Admitting: Internal Medicine

## 2013-09-10 NOTE — Telephone Encounter (Signed)
Patient would like a refill on her sitaGLIPtan-metformin (JANUMET) 50-1000 MG per tablet   Pharmacy Pharmacy: [place], [place]

## 2013-09-16 ENCOUNTER — Other Ambulatory Visit: Payer: Self-pay | Admitting: *Deleted

## 2013-09-16 MED ORDER — SITAGLIPTIN PHOS-METFORMIN HCL 50-1000 MG PO TABS: ORAL_TABLET | ORAL | Status: DC

## 2013-09-16 NOTE — Telephone Encounter (Signed)
Rx Janumet was e-scribed for patient.  Ag cma

## 2013-09-21 NOTE — Telephone Encounter (Signed)
Rx refilled and sent to the pharmacy on 09-16-13.//AB/CMA

## 2013-09-24 ENCOUNTER — Encounter: Payer: Self-pay | Admitting: *Deleted

## 2013-09-24 ENCOUNTER — Ambulatory Visit (INDEPENDENT_AMBULATORY_CARE_PROVIDER_SITE_OTHER): Payer: MEDICARE

## 2013-09-24 DIAGNOSIS — M1990 Unspecified osteoarthritis, unspecified site: Secondary | ICD-10-CM | POA: Insufficient documentation

## 2013-09-24 DIAGNOSIS — Z23 Encounter for immunization: Secondary | ICD-10-CM

## 2013-09-24 DIAGNOSIS — B351 Tinea unguium: Secondary | ICD-10-CM | POA: Insufficient documentation

## 2013-09-28 ENCOUNTER — Telehealth: Payer: Self-pay | Admitting: *Deleted

## 2013-09-28 DIAGNOSIS — IMO0001 Reserved for inherently not codable concepts without codable children: Secondary | ICD-10-CM

## 2013-09-28 DIAGNOSIS — T887XXA Unspecified adverse effect of drug or medicament, initial encounter: Secondary | ICD-10-CM

## 2013-09-28 DIAGNOSIS — E785 Hyperlipidemia, unspecified: Secondary | ICD-10-CM

## 2013-09-28 DIAGNOSIS — I10 Essential (primary) hypertension: Secondary | ICD-10-CM

## 2013-09-28 NOTE — Telephone Encounter (Signed)
Informed the pt that she is due for labs and OV with Dr. Linna Darner.  Pt agreed and scheduled lab and OV appt.  Pt said we can wait to refill med until she see's Dr. Linna Darner. Future orders for labs sent.//AB/CMA

## 2013-10-01 ENCOUNTER — Other Ambulatory Visit (INDEPENDENT_AMBULATORY_CARE_PROVIDER_SITE_OTHER): Payer: MEDICARE

## 2013-10-01 DIAGNOSIS — I10 Essential (primary) hypertension: Secondary | ICD-10-CM

## 2013-10-01 DIAGNOSIS — E785 Hyperlipidemia, unspecified: Secondary | ICD-10-CM

## 2013-10-01 DIAGNOSIS — IMO0001 Reserved for inherently not codable concepts without codable children: Secondary | ICD-10-CM

## 2013-10-01 DIAGNOSIS — T887XXA Unspecified adverse effect of drug or medicament, initial encounter: Secondary | ICD-10-CM

## 2013-10-01 LAB — CBC WITH DIFFERENTIAL/PLATELET
Basophils Absolute: 0.1 10*3/uL (ref 0.0–0.1)
Basophils Relative: 0.7 % (ref 0.0–3.0)
Eosinophils Absolute: 0.7 10*3/uL (ref 0.0–0.7)
HCT: 40.3 % (ref 36.0–46.0)
Hemoglobin: 14.3 g/dL (ref 12.0–15.0)
Lymphocytes Relative: 29.2 % (ref 12.0–46.0)
Lymphs Abs: 2.9 10*3/uL (ref 0.7–4.0)
MCHC: 35.4 g/dL (ref 30.0–36.0)
MCV: 97.4 fl (ref 78.0–100.0)
Monocytes Absolute: 0.9 10*3/uL (ref 0.1–1.0)
Monocytes Relative: 8.7 % (ref 3.0–12.0)
Neutro Abs: 5.4 10*3/uL (ref 1.4–7.7)
RDW: 15.1 % — ABNORMAL HIGH (ref 11.5–14.6)

## 2013-10-01 LAB — HEPATIC FUNCTION PANEL
ALT: 21 U/L (ref 0–35)
AST: 21 U/L (ref 0–37)
Bilirubin, Direct: 0 mg/dL (ref 0.0–0.3)
Total Bilirubin: 0.8 mg/dL (ref 0.3–1.2)
Total Protein: 6.8 g/dL (ref 6.0–8.3)

## 2013-10-01 LAB — BASIC METABOLIC PANEL
CO2: 28 mEq/L (ref 19–32)
Chloride: 104 mEq/L (ref 96–112)
Sodium: 135 mEq/L (ref 135–145)

## 2013-10-01 LAB — MICROALBUMIN / CREATININE URINE RATIO
Creatinine,U: 199.2 mg/dL
Microalb Creat Ratio: 1.4 mg/g (ref 0.0–30.0)

## 2013-10-01 LAB — LIPID PANEL
Cholesterol: 173 mg/dL (ref 0–200)
HDL: 55.3 mg/dL (ref >39.00)
LDL Cholesterol: 84 mg/dL (ref 0–99)
Triglycerides: 171 mg/dL — ABNORMAL HIGH (ref 0.0–149.0)
VLDL: 34.2 mg/dL (ref 0.0–40.0)

## 2013-10-05 ENCOUNTER — Encounter: Payer: Self-pay | Admitting: Podiatry

## 2013-10-05 ENCOUNTER — Ambulatory Visit (INDEPENDENT_AMBULATORY_CARE_PROVIDER_SITE_OTHER): Payer: Medicare Other | Admitting: Podiatry

## 2013-10-05 VITALS — BP 156/70 | HR 67 | Resp 20 | Ht 67.0 in | Wt 260.0 lb

## 2013-10-05 DIAGNOSIS — M79609 Pain in unspecified limb: Secondary | ICD-10-CM

## 2013-10-05 DIAGNOSIS — B351 Tinea unguium: Secondary | ICD-10-CM

## 2013-10-05 NOTE — Progress Notes (Signed)
Subjective:     Patient ID: Katelyn Mahoney, female   DOB: Jun 21, 1934, 79 y.o.   MRN: [id number]  Diabetes   please trim my painful toenails I cannot   Review of Systems  All other systems reviewed and are negative.       Objective:   Physical Exam  Nursing note and vitals reviewed. Cardiovascular: Intact distal pulses.    Thick painful note nailbeds 1-5 bilateral    Assessment:     Painful mycotic toenails 1-5 bilateral    Plan:     Debridement of nailbeds 1-5 bilateral. No iatrogenic bleeding noted

## 2013-10-05 NOTE — Patient Instructions (Signed)
Call if any problems

## 2013-10-08 ENCOUNTER — Encounter: Payer: Self-pay | Admitting: Internal Medicine

## 2013-10-08 ENCOUNTER — Ambulatory Visit (INDEPENDENT_AMBULATORY_CARE_PROVIDER_SITE_OTHER): Payer: MEDICARE | Admitting: Internal Medicine

## 2013-10-08 VITALS — BP 162/80 | HR 41 | Temp 98.0°F | Wt 260.6 lb

## 2013-10-08 DIAGNOSIS — E785 Hyperlipidemia, unspecified: Secondary | ICD-10-CM

## 2013-10-08 DIAGNOSIS — I499 Cardiac arrhythmia, unspecified: Secondary | ICD-10-CM

## 2013-10-08 DIAGNOSIS — I10 Essential (primary) hypertension: Secondary | ICD-10-CM

## 2013-10-08 MED ORDER — SITAGLIPTIN PHOS-METFORMIN HCL 50-1000 MG PO TABS: ORAL_TABLET | ORAL | Status: DC

## 2013-10-08 MED ORDER — METOPROLOL TARTRATE 100 MG PO TABS: 100.0000 mg | ORAL_TABLET | Freq: Two times a day (BID) | ORAL | Status: DC

## 2013-10-08 MED ORDER — LOSARTAN POTASSIUM 100 MG PO TABS: 100.0000 mg | ORAL_TABLET | Freq: Every day | ORAL | Status: DC

## 2013-10-08 NOTE — Assessment & Plan Note (Signed)
Nutrition discussed; TG have improved

## 2013-10-08 NOTE — Assessment & Plan Note (Signed)
Verify cuff readings @ pharmacy & here

## 2013-10-08 NOTE — Assessment & Plan Note (Signed)
Increase Janumet to 1 bid Victoza declined

## 2013-10-08 NOTE — Patient Instructions (Addendum)
Increase the Janumet 50/1000mg  to twice a day, and repeat HbgA1c in 4 months.   Avoid stimulants due to premature beats.

## 2013-10-08 NOTE — Progress Notes (Signed)
Subjective:    Patient ID: Katelyn Mahoney, female    DOB: 12-Aug-1934, 79 y.o.   MRN: [id number]  HPI The patient is here for followup of diabetes, hyperlipidemia, and hypertension.  The most recent A1c was 7.3 %  , which correlates to an average sugar of 183  , and long-term risk of  46%. Fasting blood sugar range average 180 .  Highest two-hour postprandial glucose is <  240 . No hypoglycemia reported Last ophthalmologic examination 2014  revealed no retinopathy. Podiatry assessment on record. Diet is sugar restricted . No exercise except housework.  The most recent lipids   reveal LDL 84 ; HDL 55.3 ; and triglycerides  171  . There is medical compliance with the statin.No myalgias but LBP which precludes exercise  Blood pressure   average  135/68 . There is medical   with antihypertensive medications  No  medication adverse effects suggested        Review of Systems  Constitutional: No  significant weight change;  excess fatigue Eyes: No  blurred vision;  double vision ; loss of vision Cardiovascular: no chest pain . Occasional palpitations. No irregular rhythm ;syncope; claudication. Intermittent  edema with prolonged standing Respiratory: Some exertional dyspnea; no paroxysmal nocturnal dyspnea Genitourinary: No dysuria; hematuria ; pyuria; frequency;  Incontinence;  nocturia Musculoskeletal: No myalgias or muscle cramping  Dermatologic: No non healing  Lesions; change in color or temperature of skin Neurologic: No  limb weakness;  numbness or tingling;  burning Endocrine: No change in hair, skin, nails. No excessive thirst. Some excessive hunger. No  excessive urination        Objective:   Physical Exam Gen.: Adequately nourished in appearance. Weight excess. Alert, appropriate and cooperative throughout exam.  Eyes: No corneal or conjunctival inflammation noted.  Extraocular motion intact.  Nose: External nasal exam reveals no deformity or inflammation. Nasal  mucosa are pink and moist. No lesions or exudates noted.  Mouth: Oral mucosa and oropharynx reveal no lesions or exudates. Upper plate; lower partial Neck: No deformities, masses, or tenderness noted. Range of motion & Thyroid normal Lungs: Normal respiratory effort; chest expands symmetrically. Lungs are clear to auscultation without rales, wheezes, or increased work of breathing. Heart: Normal rate ; irregular rhythm. Normal S1 and S2. No gallop, click, or rub. No murmur. Abdomen: Bowel sounds normal; abdomen soft and nontender. No masses, organomegaly or hernias noted.Protuberant Genitalia: As per Gyn                                  Musculoskeletal/extremities:  Accentuated curvature of upper thoracic  Spine. No clubbing, cyanosis, or significant extremity  deformity noted. Range of motion normal .Tone & strength  Normal. Crepitus knees , L > R. Lipedema Joints normal / reveal mild  DJD DIP changes. Nail health good. Able to lie down & sit up w/o help. Negative SLR bilaterally Vascular: Carotid, radial artery, dorsalis pedis and  posterior tibial pulses are full and equal. No bruits present. Neurologic: Alert and oriented x3. Deep tendon reflexes symmetrical but 0+ @ knees . ?decreased light touch R foot. Using 4 pronged cane .        Skin: Intact without suspicious lesions or rashes. Lymph: No cervical, axillary lymphadenopathy present. Psych: Mood and affect are normal. Normally interactive  Assessment & Plan:  See Current Assessment & Plan in Problem List under specific Diagnoses Irregular rhythm due to PACs, not AF

## 2013-10-22 ENCOUNTER — Other Ambulatory Visit: Payer: Self-pay | Admitting: Internal Medicine

## 2013-10-23 NOTE — Telephone Encounter (Signed)
Med filled.  

## 2013-10-28 ENCOUNTER — Other Ambulatory Visit: Payer: Self-pay | Admitting: *Deleted

## 2013-10-28 MED ORDER — SERTRALINE HCL 100 MG PO TABS: 100.0000 mg | ORAL_TABLET | Freq: Every day | ORAL | Status: DC

## 2013-10-28 NOTE — Telephone Encounter (Signed)
Sertraline refill sent to pharmacy.

## 2014-01-11 ENCOUNTER — Ambulatory Visit: Payer: Medicare Other | Admitting: Podiatry

## 2014-01-21 ENCOUNTER — Ambulatory Visit (INDEPENDENT_AMBULATORY_CARE_PROVIDER_SITE_OTHER): Payer: Medicare Other | Admitting: Podiatry

## 2014-01-21 ENCOUNTER — Encounter: Payer: Self-pay | Admitting: Podiatry

## 2014-01-21 VITALS — BP 140/80 | HR 64 | Resp 12

## 2014-01-21 DIAGNOSIS — M79609 Pain in unspecified limb: Secondary | ICD-10-CM

## 2014-01-21 DIAGNOSIS — B351 Tinea unguium: Secondary | ICD-10-CM

## 2014-01-21 NOTE — Progress Notes (Signed)
Subjective:     Patient ID: Katelyn Mahoney, female   DOB: [birth year], 80 y.o.   MRN: [id number]  HPI patient presents with a long gaited nailbeds and pain 1-5 both feet with inability to cut herself   Review of Systems     Objective:   Physical Exam Neurovascular status intact with thick painful nail bed 1-5 both feet    Assessment:     Mycotic nail infection with pain 1-5 both feet    Plan:     Debridement painful nailbeds 1-5 both feet with no iatrogenic bleeding

## 2014-04-07 ENCOUNTER — Ambulatory Visit (INDEPENDENT_AMBULATORY_CARE_PROVIDER_SITE_OTHER): Payer: Medicare Other | Admitting: Family Medicine

## 2014-04-07 ENCOUNTER — Telehealth: Payer: Self-pay | Admitting: Internal Medicine

## 2014-04-07 ENCOUNTER — Encounter: Payer: Self-pay | Admitting: Family Medicine

## 2014-04-07 VITALS — BP 130/80 | HR 42 | Temp 98.2°F | Resp 16 | Wt 252.1 lb

## 2014-04-07 DIAGNOSIS — E1165 Type 2 diabetes mellitus with hyperglycemia: Principal | ICD-10-CM

## 2014-04-07 DIAGNOSIS — IMO0001 Reserved for inherently not codable concepts without codable children: Secondary | ICD-10-CM

## 2014-04-07 DIAGNOSIS — E785 Hyperlipidemia, unspecified: Secondary | ICD-10-CM

## 2014-04-07 DIAGNOSIS — Z23 Encounter for immunization: Secondary | ICD-10-CM

## 2014-04-07 DIAGNOSIS — I10 Essential (primary) hypertension: Secondary | ICD-10-CM

## 2014-04-07 LAB — BASIC METABOLIC PANEL
BUN: 32 mg/dL — ABNORMAL HIGH (ref 6–23)
CO2: 23 mEq/L (ref 19–32)
Calcium: 9.2 mg/dL (ref 8.4–10.5)
Chloride: 102 mEq/L (ref 96–112)
Creatinine, Ser: 1.3 mg/dL — ABNORMAL HIGH (ref 0.4–1.2)
GFR: 41.44 mL/min — ABNORMAL LOW (ref >60.00)
Glucose, Bld: 152 mg/dL — ABNORMAL HIGH (ref 70–99)
POTASSIUM: 4.7 meq/L (ref 3.5–5.1)
Sodium: 133 mEq/L — ABNORMAL LOW (ref 135–145)

## 2014-04-07 LAB — CBC WITH DIFFERENTIAL/PLATELET
BASOS PCT: 0.8 % (ref 0.0–3.0)
Basophils Absolute: 0.1 10*3/uL (ref 0.0–0.1)
EOS PCT: 8.1 % — AB (ref 0.0–5.0)
Eosinophils Absolute: 1 10*3/uL — ABNORMAL HIGH (ref 0.0–0.7)
HEMATOCRIT: 39.1 % (ref 36.0–46.0)
Hemoglobin: 13.5 g/dL (ref 12.0–15.0)
LYMPHS ABS: 3.1 10*3/uL (ref 0.7–4.0)
Lymphocytes Relative: 26.2 % (ref 12.0–46.0)
MCHC: 34.6 g/dL (ref 30.0–36.0)
MCV: 99.8 fl (ref 78.0–100.0)
MONOS PCT: 8.7 % (ref 3.0–12.0)
Monocytes Absolute: 1 10*3/uL (ref 0.1–1.0)
NEUTROS ABS: 6.7 10*3/uL (ref 1.4–7.7)
Neutrophils Relative %: 56.2 % (ref 43.0–77.0)
Platelets: 345 10*3/uL (ref 150.0–400.0)
RBC: 3.92 Mil/uL (ref 3.87–5.11)
RDW: 15.8 % — AB (ref 11.5–14.6)
WBC: 12 10*3/uL — AB (ref 4.5–10.5)

## 2014-04-07 LAB — LIPID PANEL
CHOLESTEROL: 167 mg/dL (ref 0–200)
HDL: 56.5 mg/dL (ref >39.00)
LDL Cholesterol: 73 mg/dL (ref 0–99)
Total CHOL/HDL Ratio: 3
Triglycerides: 190 mg/dL — ABNORMAL HIGH (ref 0.0–149.0)
VLDL: 38 mg/dL (ref 0.0–40.0)

## 2014-04-07 LAB — HEPATIC FUNCTION PANEL
ALK PHOS: 54 U/L (ref 39–117)
ALT: 23 U/L (ref 0–35)
AST: 22 U/L (ref 0–37)
Albumin: 3.7 g/dL (ref 3.5–5.2)
Bilirubin, Direct: 0 mg/dL (ref 0.0–0.3)
Total Bilirubin: 0.9 mg/dL (ref 0.3–1.2)
Total Protein: 7 g/dL (ref 6.0–8.3)

## 2014-04-07 LAB — TSH: TSH: 0.54 u[IU]/mL (ref 0.35–5.50)

## 2014-04-07 LAB — HEMOGLOBIN A1C: HEMOGLOBIN A1C: 6.5 % (ref 4.6–6.5)

## 2014-04-07 MED ORDER — CENTRUM SILVER ULTRA WOMENS PO TABS: 1.0000 | ORAL_TABLET | Freq: Every day | ORAL | Status: DC

## 2014-04-07 MED ORDER — SITAGLIPTIN PHOS-METFORMIN HCL 50-1000 MG PO TABS: ORAL_TABLET | ORAL | Status: DC

## 2014-04-07 MED ORDER — HAIR/SKIN/NAILS PO TABS: 1.0000 | ORAL_TABLET | Freq: Every day | ORAL | Status: DC

## 2014-04-07 MED ORDER — OCUVITE-LUTEIN PO CAPS: 1.0000 | ORAL_CAPSULE | Freq: Every day | ORAL | Status: AC

## 2014-04-07 NOTE — Assessment & Plan Note (Signed)
New to provider, ongoing for pt.  Well controlled.  Asymptomatic.  Check labs.  No anticipated med changes. 

## 2014-04-07 NOTE — Patient Instructions (Signed)
Follow up in 3-4 months to recheck diabetes We'll notify you of your lab results and make any changes if needed Keep up the good work! Call with any questions or concerns Happy Spring!

## 2014-04-07 NOTE — Assessment & Plan Note (Signed)
New to provider, ongoing for pt.  UTD on eye exam.  On ARB for renal protection.  Foot exam done today.  Check labs.  Adjust meds prn

## 2014-04-07 NOTE — Progress Notes (Signed)
Pre visit review using our clinic review tool, if applicable. No additional management support is needed unless otherwise documented below in the visit note. 

## 2014-04-07 NOTE — Telephone Encounter (Signed)
Relevant patient education assigned to patient using Emmi. ° °

## 2014-04-07 NOTE — Progress Notes (Signed)
   Subjective:    Patient ID: Katelyn Mahoney, female    DOB: [birth year], 80 y.o.   MRN: [id number]  HPI HTN- chronic problem, on Losartan, Lasix as needed, metoprolol.  Well controlled.  No CP, SOB, HAs, visual changes, edema.  Hyperlipidemia- chronic problem, on Lipitor.  Denies abd pain, N/V, myalgias.  DM- chronic problem, on Janumet.  On ARB for renal protection.  Needs 90 day script for Janumet.  UTD on eye exam- due in July.  No symptomatic lows.  CBGs typically run 140-160s.  + numbness in feet.     Review of Systems For ROS see HPI     Objective:   Physical Exam  Vitals reviewed. Constitutional: She is oriented to person, place, and time. She appears well-developed and well-nourished. No distress.  obese  HENT:  Head: Normocephalic and atraumatic.  Eyes: Conjunctivae and EOM are normal. Pupils are equal, round, and reactive to light.  Neck: Normal range of motion. Neck supple. No thyromegaly present.  Cardiovascular: Normal rate, normal heart sounds and intact distal pulses.   No murmur heard. Possible S4  Pulmonary/Chest: Effort normal and breath sounds normal. No respiratory distress.  Abdominal: Soft. She exhibits no distension. There is no tenderness.  Musculoskeletal: She exhibits no edema.  Lymphadenopathy:    She has no cervical adenopathy.  Neurological: She is alert and oriented to person, place, and time.  Skin: Skin is warm and dry.  Psychiatric: She has a normal mood and affect. Her behavior is normal.          Assessment & Plan:

## 2014-04-07 NOTE — Assessment & Plan Note (Signed)
New to provider, chronic for pt.  Tolerating statin w/o difficulty.  Check labs.  Adjust meds prn

## 2014-04-08 ENCOUNTER — Other Ambulatory Visit: Payer: Self-pay | Admitting: Family Medicine

## 2014-04-08 DIAGNOSIS — R7989 Other specified abnormal findings of blood chemistry: Secondary | ICD-10-CM

## 2014-04-19 ENCOUNTER — Ambulatory Visit (INDEPENDENT_AMBULATORY_CARE_PROVIDER_SITE_OTHER): Payer: Medicare Other | Admitting: Podiatry

## 2014-04-19 ENCOUNTER — Encounter: Payer: Self-pay | Admitting: Podiatry

## 2014-04-19 VITALS — BP 160/73 | HR 78 | Resp 18 | Ht 68.0 in | Wt 260.0 lb

## 2014-04-19 DIAGNOSIS — B351 Tinea unguium: Secondary | ICD-10-CM

## 2014-04-19 DIAGNOSIS — M79609 Pain in unspecified limb: Secondary | ICD-10-CM

## 2014-04-20 ENCOUNTER — Telehealth: Payer: Self-pay

## 2014-04-20 NOTE — Telephone Encounter (Signed)
Relevant patient education assigned to patient using Emmi. ° °

## 2014-04-21 NOTE — Progress Notes (Signed)
Subjective:     Patient ID: Katelyn Mahoney, female   DOB: [birth year], 80 y.o.   MRN: [id number]  HPI my nails are thick and painful and I cannot cut them myself   Review of Systems     Objective:   Physical Exam Neurovascular status intact with thick incurvated nail bed 1-5 both feet    Assessment:     Mycotic nail infection with pain 1-5 both feet    Plan:

## 2014-04-22 ENCOUNTER — Other Ambulatory Visit (INDEPENDENT_AMBULATORY_CARE_PROVIDER_SITE_OTHER): Payer: Medicare Other

## 2014-04-22 DIAGNOSIS — R7989 Other specified abnormal findings of blood chemistry: Secondary | ICD-10-CM

## 2014-04-22 DIAGNOSIS — R799 Abnormal finding of blood chemistry, unspecified: Secondary | ICD-10-CM

## 2014-04-22 LAB — BASIC METABOLIC PANEL
BUN: 29 mg/dL — ABNORMAL HIGH (ref 6–23)
CALCIUM: 9.8 mg/dL (ref 8.4–10.5)
CO2: 24 mEq/L (ref 19–32)
Chloride: 102 mEq/L (ref 96–112)
Creatinine, Ser: 1.3 mg/dL — ABNORMAL HIGH (ref 0.4–1.2)
GFR: 42.55 mL/min — AB (ref >60.00)
Glucose, Bld: 127 mg/dL — ABNORMAL HIGH (ref 70–99)
Potassium: 4.9 mEq/L (ref 3.5–5.1)
SODIUM: 135 meq/L (ref 135–145)

## 2014-04-23 ENCOUNTER — Telehealth: Payer: Self-pay | Admitting: Internal Medicine

## 2014-04-23 NOTE — Telephone Encounter (Signed)
Left a message for patient. Per Dr Linna Darner check with patient to see if she is seeing Dr Birdie Riddle. We received a refill request [facility] If she is refill request should go to Dr Birdie Riddle. If not okay to fill x 1. Will await patient call back.

## 2014-04-26 MED ORDER — SERTRALINE HCL 100 MG PO TABS: 100.0000 mg | ORAL_TABLET | Freq: Every day | ORAL | Status: DC

## 2014-04-26 NOTE — Telephone Encounter (Signed)
Pt thought she sent the request to Dr. Birdie Riddle.  I will forward it to her.

## 2014-04-26 NOTE — Telephone Encounter (Signed)
[facility] for #90, 1 [facility]

## 2014-04-26 NOTE — Telephone Encounter (Signed)
Med filled.  

## 2014-05-31 LAB — HM DIABETES EYE EXAM

## 2014-06-24 LAB — HM MAMMOGRAPHY

## 2014-06-25 ENCOUNTER — Encounter: Payer: Self-pay | Admitting: General Practice

## 2014-07-01 ENCOUNTER — Encounter: Payer: Self-pay | Admitting: Family Medicine

## 2014-07-05 ENCOUNTER — Ambulatory Visit (INDEPENDENT_AMBULATORY_CARE_PROVIDER_SITE_OTHER): Payer: Medicare Other | Admitting: Family Medicine

## 2014-07-05 ENCOUNTER — Encounter: Payer: Self-pay | Admitting: Family Medicine

## 2014-07-05 VITALS — BP 124/80 | HR 62 | Temp 98.2°F | Wt 247.4 lb

## 2014-07-05 DIAGNOSIS — I10 Essential (primary) hypertension: Secondary | ICD-10-CM

## 2014-07-05 DIAGNOSIS — IMO0001 Reserved for inherently not codable concepts without codable children: Secondary | ICD-10-CM

## 2014-07-05 DIAGNOSIS — E1165 Type 2 diabetes mellitus with hyperglycemia: Principal | ICD-10-CM

## 2014-07-05 LAB — BASIC METABOLIC PANEL
BUN: 24 mg/dL — ABNORMAL HIGH (ref 6–23)
CO2: 22 mEq/L (ref 19–32)
Calcium: 9.4 mg/dL (ref 8.4–10.5)
Chloride: 101 mEq/L (ref 96–112)
Creatinine, Ser: 1.3 mg/dL — ABNORMAL HIGH (ref 0.4–1.2)
GFR: 43.3 mL/min — AB (ref >60.00)
Glucose, Bld: 177 mg/dL — ABNORMAL HIGH (ref 70–99)
POTASSIUM: 4.5 meq/L (ref 3.5–5.1)
SODIUM: 132 meq/L — AB (ref 135–145)

## 2014-07-05 LAB — HEMOGLOBIN A1C: HEMOGLOBIN A1C: 6.5 % (ref 4.6–6.5)

## 2014-07-05 MED ORDER — ONETOUCH DELICA LANCETS 33G MISC: Status: DC

## 2014-07-05 MED ORDER — GLUCOSE BLOOD VI STRP: ORAL_STRIP | Status: DC

## 2014-07-05 MED ORDER — SITAGLIPTIN PHOS-METFORMIN HCL 50-1000 MG PO TABS: ORAL_TABLET | ORAL | Status: DC

## 2014-07-05 MED ORDER — LOSARTAN POTASSIUM 100 MG PO TABS: 100.0000 mg | ORAL_TABLET | Freq: Every day | ORAL | Status: DC

## 2014-07-05 MED ORDER — DICLOFENAC EPOLAMINE 1.3 % TD PTCH: 1.0000 | MEDICATED_PATCH | Freq: Two times a day (BID) | TRANSDERMAL | Status: DC

## 2014-07-05 NOTE — Assessment & Plan Note (Signed)
Chronic problem.  Applauded pt's recent attempts at weight loss.  UTD on eye exam.  Tolerating meds w/o difficulty.  Asymptomatic.  Check labs.  Adjust meds prn

## 2014-07-05 NOTE — Assessment & Plan Note (Signed)
Refilled Losartan.  BP well controlled.  No changes.

## 2014-07-05 NOTE — Progress Notes (Signed)
Pre visit review using our clinic review tool, if applicable. No additional management support is needed unless otherwise documented below in the visit note. 

## 2014-07-05 NOTE — Progress Notes (Signed)
   Subjective:    Patient ID: Katelyn Mahoney, female    DOB: September 26, 1934, 80 y.o.   MRN: [id number]  HPI DM- chronic problem, excellent A1C control in April- 6.5.  On ARB for renal protection.  UTD on eye exam- this week.  Has lost 15 lbs since last visit.  Is eating better diet.  Denies symptomatic lows.  No numbness/tingling.  No CP, SOB, HAs, visual changes, palpitations, edema.   Review of Systems For ROS see HPI     Objective:   Physical Exam  Vitals reviewed. Constitutional: She is oriented to person, place, and time. She appears well-developed and well-nourished. No distress.  HENT:  Head: Normocephalic and atraumatic.  Eyes: Conjunctivae and EOM are normal. Pupils are equal, round, and reactive to light.  Neck: Normal range of motion. Neck supple. No thyromegaly present.  Cardiovascular: Normal rate, regular rhythm, normal heart sounds and intact distal pulses.   No murmur heard. Pulmonary/Chest: Effort normal and breath sounds normal. No respiratory distress.  Abdominal: Soft. She exhibits no distension. There is no tenderness.  Musculoskeletal: She exhibits no edema.  Lymphadenopathy:    She has no cervical adenopathy.  Neurological: She is alert and oriented to person, place, and time.  Skin: Skin is warm and dry.  Psychiatric: She has a normal mood and affect. Her behavior is normal.          Assessment & Plan:

## 2014-07-05 NOTE — Patient Instructions (Signed)
Schedule your complete physical in 3-4 months We'll notify you of your lab results and make any changes if needed Keep up the good work!  You look great! Call with any questions or concerns Have a great summer!

## 2014-07-06 ENCOUNTER — Telehealth: Payer: Self-pay | Admitting: Family Medicine

## 2014-07-06 NOTE — Telephone Encounter (Signed)
Relevant patient education assigned to patient using Emmi. ° °

## 2014-07-16 ENCOUNTER — Telehealth: Payer: Self-pay | Admitting: *Deleted

## 2014-07-16 NOTE — Telephone Encounter (Signed)
Received [employer] paperwork via fax [employer].  Placed in folder for Dr. Birdie Riddle to review and complete.//AB/CMA

## 2014-07-19 ENCOUNTER — Ambulatory Visit (INDEPENDENT_AMBULATORY_CARE_PROVIDER_SITE_OTHER): Payer: Medicare Other | Admitting: Podiatry

## 2014-07-19 ENCOUNTER — Ambulatory Visit (INDEPENDENT_AMBULATORY_CARE_PROVIDER_SITE_OTHER): Payer: Medicare Other

## 2014-07-19 DIAGNOSIS — M79673 Pain in unspecified foot: Secondary | ICD-10-CM

## 2014-07-19 DIAGNOSIS — S9030XA Contusion of unspecified foot, initial encounter: Secondary | ICD-10-CM

## 2014-07-19 DIAGNOSIS — S9031XA Contusion of right foot, initial encounter: Secondary | ICD-10-CM

## 2014-07-19 DIAGNOSIS — B351 Tinea unguium: Secondary | ICD-10-CM

## 2014-07-19 DIAGNOSIS — M7750 Other enthesopathy of unspecified foot: Secondary | ICD-10-CM

## 2014-07-19 DIAGNOSIS — M779 Enthesopathy, unspecified: Secondary | ICD-10-CM

## 2014-07-19 DIAGNOSIS — M778 Other enthesopathies, not elsewhere classified: Secondary | ICD-10-CM

## 2014-07-19 DIAGNOSIS — M79609 Pain in unspecified limb: Secondary | ICD-10-CM

## 2014-07-19 NOTE — Progress Notes (Signed)
   Subjective:    Patient ID: Katelyn Mahoney, female    DOB: [birth year], 80 y.o.   MRN: [id number]  HPI Pt presents for nail trim, no other issues  Review of Systems     Objective:   Physical Exam        Assessment & Plan:

## 2014-07-21 NOTE — Progress Notes (Signed)
Subjective:     Patient ID: Katelyn Mahoney, female   DOB: 1934/08/26, 80 y.o.   MRN: [id number]  HPI patient presents with painful nailbeds 1-5 both feet that are thick and impossible for patient to cut   Review of Systems     Objective:   Physical Exam Neurovascular status intact with thick yellow brittle toenails 1-5 both feet with pain    Assessment:     Mycotic nail infection with pain 1-5 both feet    Plan:     Debris painful nailbeds 1-5 both feet with no iatrogenic bleeding noted

## 2014-09-09 ENCOUNTER — Ambulatory Visit (INDEPENDENT_AMBULATORY_CARE_PROVIDER_SITE_OTHER): Payer: Medicare Other

## 2014-09-09 DIAGNOSIS — Z23 Encounter for immunization: Secondary | ICD-10-CM

## 2014-09-09 NOTE — Progress Notes (Signed)
Pt tolerated injection well.  No signs of a reaction upon leaving clinic.

## 2014-09-13 ENCOUNTER — Encounter: Payer: Self-pay | Admitting: Family Medicine

## 2014-09-13 MED ORDER — SERTRALINE HCL 100 MG PO TABS: 100.0000 mg | ORAL_TABLET | Freq: Every day | ORAL | Status: DC

## 2014-09-13 NOTE — Telephone Encounter (Signed)
Med filled.  

## 2014-10-07 NOTE — Telephone Encounter (Signed)
Completed.//AB/CMA 

## 2014-10-25 ENCOUNTER — Ambulatory Visit (INDEPENDENT_AMBULATORY_CARE_PROVIDER_SITE_OTHER): Payer: Medicare Other | Admitting: Podiatry

## 2014-10-25 DIAGNOSIS — M79673 Pain in unspecified foot: Secondary | ICD-10-CM

## 2014-10-25 DIAGNOSIS — B351 Tinea unguium: Secondary | ICD-10-CM

## 2014-10-25 NOTE — Progress Notes (Signed)
   Subjective:    Patient ID: Katelyn Mahoney, female    DOB: 04-27-1934, 81 y.o.   MRN: [id number]  HPI  Pt presents for nail debridement  Review of Systems     Objective:   Physical Exam        Assessment & Plan:

## 2014-10-26 NOTE — Progress Notes (Signed)
Subjective:     Patient ID: Katelyn Mahoney, female   DOB: 06/13/1934, 80 y.o.   MRN: [id number]  HPI patient presents with elongated nail beds 1-5 both feet that she cannot take care of and are consistently sore   Review of Systems     Objective:   Physical Exam Neurovascular status unchanged with thick yellow brittle nailbeds 1-5 of both feet    Assessment:     Mycotic nail infection with pain 1-5 both feet    Plan:     Debridement painful nailbeds 1-5 both feet with no iatrogenic bleeding noted

## 2014-11-09 ENCOUNTER — Ambulatory Visit (INDEPENDENT_AMBULATORY_CARE_PROVIDER_SITE_OTHER): Payer: Medicare Other | Admitting: Family Medicine

## 2014-11-09 ENCOUNTER — Encounter: Payer: Self-pay | Admitting: Family Medicine

## 2014-11-09 VITALS — BP 124/78 | HR 60 | Temp 97.9°F | Resp 16 | Ht 64.75 in | Wt 242.5 lb

## 2014-11-09 DIAGNOSIS — E785 Hyperlipidemia, unspecified: Secondary | ICD-10-CM

## 2014-11-09 DIAGNOSIS — Z23 Encounter for immunization: Secondary | ICD-10-CM

## 2014-11-09 DIAGNOSIS — I10 Essential (primary) hypertension: Secondary | ICD-10-CM

## 2014-11-09 DIAGNOSIS — E1121 Type 2 diabetes mellitus with diabetic nephropathy: Secondary | ICD-10-CM

## 2014-11-09 DIAGNOSIS — Z0000 Encounter for general adult medical examination without abnormal findings: Secondary | ICD-10-CM

## 2014-11-09 DIAGNOSIS — E1169 Type 2 diabetes mellitus with other specified complication: Secondary | ICD-10-CM

## 2014-11-09 DIAGNOSIS — F32A Depression, unspecified: Secondary | ICD-10-CM

## 2014-11-09 DIAGNOSIS — Z780 Asymptomatic menopausal state: Secondary | ICD-10-CM

## 2014-11-09 DIAGNOSIS — Z7189 Other specified counseling: Secondary | ICD-10-CM | POA: Insufficient documentation

## 2014-11-09 DIAGNOSIS — F329 Major depressive disorder, single episode, unspecified: Secondary | ICD-10-CM

## 2014-11-09 LAB — CBC WITH DIFFERENTIAL/PLATELET
Basophils Absolute: 0.1 10*3/uL (ref 0.0–0.1)
Basophils Relative: 0.9 % (ref 0.0–3.0)
EOS PCT: 7.6 % — AB (ref 0.0–5.0)
Eosinophils Absolute: 1.1 10*3/uL — ABNORMAL HIGH (ref 0.0–0.7)
HEMATOCRIT: 40.7 % (ref 36.0–46.0)
HEMOGLOBIN: 14 g/dL (ref 12.0–15.0)
Lymphocytes Relative: 25.3 % (ref 12.0–46.0)
Lymphs Abs: 3.5 10*3/uL (ref 0.7–4.0)
MCHC: 34.4 g/dL (ref 30.0–36.0)
MCV: 98.2 fl (ref 78.0–100.0)
MONOS PCT: 6.4 % (ref 3.0–12.0)
Monocytes Absolute: 0.9 10*3/uL (ref 0.1–1.0)
NEUTROS ABS: 8.2 10*3/uL — AB (ref 1.4–7.7)
Neutrophils Relative %: 59.8 % (ref 43.0–77.0)
Platelets: 325 10*3/uL (ref 150.0–400.0)
RBC: 4.14 Mil/uL (ref 3.87–5.11)
RDW: 15.3 % (ref 11.5–15.5)
WBC: 13.8 10*3/uL — AB (ref 4.0–10.5)

## 2014-11-09 LAB — HEPATIC FUNCTION PANEL
ALBUMIN: 3.2 g/dL — AB (ref 3.5–5.2)
ALK PHOS: 60 U/L (ref 39–117)
ALT: 21 U/L (ref 0–35)
AST: 23 U/L (ref 0–37)
Bilirubin, Direct: 0 mg/dL (ref 0.0–0.3)
Total Bilirubin: 0.6 mg/dL (ref 0.2–1.2)
Total Protein: 6.9 g/dL (ref 6.0–8.3)

## 2014-11-09 LAB — LIPID PANEL
Cholesterol: 170 mg/dL (ref 0–200)
HDL: 52 mg/dL (ref >39.00)
LDL Cholesterol: 84 mg/dL (ref 0–99)
NonHDL: 118
TRIGLYCERIDES: 170 mg/dL — AB (ref 0.0–149.0)
Total CHOL/HDL Ratio: 3
VLDL: 34 mg/dL (ref 0.0–40.0)

## 2014-11-09 LAB — BASIC METABOLIC PANEL
BUN: 29 mg/dL — AB (ref 6–23)
CO2: 27 mEq/L (ref 19–32)
CREATININE: 1.5 mg/dL — AB (ref 0.4–1.2)
Calcium: 10.4 mg/dL (ref 8.4–10.5)
Chloride: 101 mEq/L (ref 96–112)
GFR: 34.9 mL/min — AB (ref >60.00)
Glucose, Bld: 160 mg/dL — ABNORMAL HIGH (ref 70–99)
POTASSIUM: 5.2 meq/L — AB (ref 3.5–5.1)
Sodium: 135 mEq/L (ref 135–145)

## 2014-11-09 LAB — TSH: TSH: 1.46 u[IU]/mL (ref 0.35–4.50)

## 2014-11-09 LAB — HEMOGLOBIN A1C: Hgb A1c MFr Bld: 6.3 % (ref 4.6–6.5)

## 2014-11-09 NOTE — Assessment & Plan Note (Signed)
Pt's PE WNL w/ exception of known obesity and irregular HR.  UTD on mammo.  No need for pap.  DEXA ordered.  Pt open to idea of cologuard.  Prevnar given in office.  Check labs.  Anticipatory guidance provided.

## 2014-11-09 NOTE — Progress Notes (Signed)
Pre visit review using our clinic review tool, if applicable. No additional management support is needed unless otherwise documented below in the visit note. 

## 2014-11-09 NOTE — Addendum Note (Signed)
Addended by: Kris Hartmann on: 11/09/2014 11:41 AM   Modules accepted: Orders

## 2014-11-09 NOTE — Patient Instructions (Signed)
Follow up in 3-4 months to recheck diabetes We'll call you with your bone density appt The Cologuard will be mailed directly to your home- you just need to complete it as directed and send it back We'll notify you of your lab results and make any changes if needed Keep up the good work on healthy diet!  You're losing weight! Call with any questions or concerns Happy Holidays and Happy Early Rudene Anda!

## 2014-11-09 NOTE — Assessment & Plan Note (Signed)
Chronic problem.  sxs are well controlled on Zoloft.  No med changes at this time.

## 2014-11-09 NOTE — Assessment & Plan Note (Signed)
Chronic problem.  Tolerating statin w/o difficulty.  Applauded pt's recent weight loss.  Check labs.  Adjust meds prn

## 2014-11-09 NOTE — Assessment & Plan Note (Signed)
Chronic problem.  Pt has lost 5 lbs recently w/ healthy diet.  Applauded her efforts.  Will continue to follow as this impacts BP, lipids, DM.

## 2014-11-09 NOTE — Assessment & Plan Note (Signed)
Chronic problem.  A1C was well controlled at last visit but Cr was 1.3 putting GFR at 43.  Pt is on ARB for renal protection- no need for microalbumin.  UTD on eye exam.  Denies symptomatic lows.  Check labs.  Adjust meds prn

## 2014-11-09 NOTE — Assessment & Plan Note (Signed)
Chronic problem.  Well controlled on current meds.  Asymptomatic.  Check labs.  No anticipated med changes.

## 2014-11-09 NOTE — Progress Notes (Signed)
   Subjective:    Patient ID: Katelyn Mahoney, female    DOB: 1934/06/04, 80 y.o.   MRN: [id number]  HPI  Here today for CPE.  Risk Factors: HTN- chronic problem, on Losartan, Metoprolol, Lasix.  Well controlled. DM- chronic problem, on Janumet.  On ARB for renal protection.  UTD on eye exam (June 2015).  Following w/ Dr Paulla Dolly for podiatry Hyperlipidemia- chronic problem, on Lipitor. Obesity- chronic problem, unable to exercise due to arthritis.  Has lost 5 lbs since last visit. Physical Activity: limited due to arthritis Fall Risk: moderate due to ambulating w/ cane Depression: chronic problem, on Zoloft Hearing: normal to conversational tones, mildly decreased to whispered voice ADL's: independent Cognitive: normal linear thought process, memory and attention intact Home Safety: safe at home, grandson is living w/ her Height, Weight, BMI, Visual Acuity: see vitals, vision corrected to 20/20 w/ glasses Counseling: due for colonoscopy- pt prefers to do Cologuard.  UTD on mammo, UTD on eye exam.  Due for DEXA- [facility]. Living will/POA- not done, info provided. Labs Ordered: See A&P Care Plan: See A&P    Review of Systems Patient reports no vision/ hearing changes, adenopathy,fever, weight change,  persistant/recurrent hoarseness , swallowing issues, chest pain, palpitations, edema, persistant/recurrent cough, hemoptysis, dyspnea (rest/exertional/paroxysmal nocturnal), gastrointestinal bleeding (melena, rectal bleeding), abdominal pain, significant heartburn, bowel changes, GU symptoms (dysuria, hematuria, incontinence), Gyn symptoms (abnormal  bleeding, pain),  syncope, focal weakness, memory loss, numbness & tingling, skin/hair/nail changes, abnormal bruising or bleeding, anxiety, or depression.     Objective:   Physical Exam General Appearance:    Alert, cooperative, no distress, appears stated age, obese  Head:    Normocephalic, without obvious abnormality, atraumatic  Eyes:     PERRL, conjunctiva/corneas clear, EOM's intact, fundi    benign, both eyes  Ears:    Normal TM's and external ear canals, both ears  Nose:   Nares normal, septum midline, mucosa normal, no drainage    or sinus tenderness  Throat:   Lips, mucosa, and tongue normal; teeth and gums normal  Neck:   Supple, symmetrical, trachea midline, no adenopathy;    Thyroid: no enlargement/tenderness/nodules  Back:     Symmetric, no curvature, ROM normal, no CVA tenderness  Lungs:     Clear to auscultation bilaterally, respirations unlabored  Chest Wall:    No tenderness or deformity   Heart:    Regularly irregular, normal rate, no murmur/rub/gallop  Breast Exam:    Deferred to mammo  Abdomen:     Soft, non-tender, bowel sounds active all four quadrants,    no masses, no organomegaly  Genitalia:    Deferred  Rectal:    Extremities:   Extremities normal, atraumatic, no cyanosis or edema  Pulses:   2+ and symmetric all extremities  Skin:   Skin color, texture, turgor normal, no rashes or lesions  Lymph nodes:   Cervical, supraclavicular, and axillary nodes normal  Neurologic:   CNII-XII intact, normal strength, sensation and reflexes    throughout          Assessment & Plan:

## 2014-11-11 ENCOUNTER — Other Ambulatory Visit: Payer: Self-pay | Admitting: General Practice

## 2014-11-11 DIAGNOSIS — D72829 Elevated white blood cell count, unspecified: Secondary | ICD-10-CM

## 2014-11-11 DIAGNOSIS — R7989 Other specified abnormal findings of blood chemistry: Secondary | ICD-10-CM

## 2014-11-11 MED ORDER — SITAGLIPTIN PHOSPHATE 50 MG PO TABS: 50.0000 mg | ORAL_TABLET | Freq: Every day | ORAL | Status: DC

## 2014-11-17 LAB — COLOGUARD
Cologuard: NEGATIVE
Cologuard: NEGATIVE

## 2014-11-18 ENCOUNTER — Other Ambulatory Visit (INDEPENDENT_AMBULATORY_CARE_PROVIDER_SITE_OTHER): Payer: Medicare Other

## 2014-11-18 ENCOUNTER — Other Ambulatory Visit: Payer: Self-pay | Admitting: Family Medicine

## 2014-11-18 DIAGNOSIS — R7989 Other specified abnormal findings of blood chemistry: Secondary | ICD-10-CM

## 2014-11-18 DIAGNOSIS — D72829 Elevated white blood cell count, unspecified: Secondary | ICD-10-CM

## 2014-11-18 DIAGNOSIS — R748 Abnormal levels of other serum enzymes: Secondary | ICD-10-CM

## 2014-11-18 LAB — CBC WITH DIFFERENTIAL/PLATELET
Basophils Absolute: 0.1 10*3/uL (ref 0.0–0.1)
Basophils Relative: 0.7 % (ref 0.0–3.0)
EOS PCT: 6.8 % — AB (ref 0.0–5.0)
Eosinophils Absolute: 0.9 10*3/uL — ABNORMAL HIGH (ref 0.0–0.7)
HEMATOCRIT: 40 % (ref 36.0–46.0)
Hemoglobin: 13.6 g/dL (ref 12.0–15.0)
Lymphocytes Relative: 29.8 % (ref 12.0–46.0)
Lymphs Abs: 4 10*3/uL (ref 0.7–4.0)
MCHC: 33.9 g/dL (ref 30.0–36.0)
MCV: 99.3 fl (ref 78.0–100.0)
MONO ABS: 1.5 10*3/uL — AB (ref 0.1–1.0)
Monocytes Relative: 11.3 % (ref 3.0–12.0)
Neutro Abs: 6.9 10*3/uL (ref 1.4–7.7)
Neutrophils Relative %: 51.4 % (ref 43.0–77.0)
PLATELETS: 336 10*3/uL (ref 150.0–400.0)
RBC: 4.03 Mil/uL (ref 3.87–5.11)
RDW: 15.9 % — AB (ref 11.5–15.5)
WBC: 13.4 10*3/uL — ABNORMAL HIGH (ref 4.0–10.5)

## 2014-11-18 LAB — BASIC METABOLIC PANEL
BUN: 26 mg/dL — ABNORMAL HIGH (ref 6–23)
CALCIUM: 8.8 mg/dL (ref 8.4–10.5)
CO2: 24 mEq/L (ref 19–32)
Chloride: 104 mEq/L (ref 96–112)
Creatinine, Ser: 1.2 mg/dL (ref 0.4–1.2)
GFR: 47.09 mL/min — AB (ref >60.00)
Glucose, Bld: 166 mg/dL — ABNORMAL HIGH (ref 70–99)
Potassium: 4.9 mEq/L (ref 3.5–5.1)
SODIUM: 135 meq/L (ref 135–145)

## 2014-12-07 ENCOUNTER — Telehealth: Payer: Self-pay | Admitting: Family Medicine

## 2014-12-07 ENCOUNTER — Encounter: Payer: Self-pay | Admitting: General Practice

## 2014-12-07 DIAGNOSIS — I10 Essential (primary) hypertension: Secondary | ICD-10-CM

## 2014-12-07 MED ORDER — METOPROLOL TARTRATE 100 MG PO TABS: 100.0000 mg | ORAL_TABLET | Freq: Two times a day (BID) | ORAL | Status: DC

## 2014-12-07 NOTE — Telephone Encounter (Signed)
Caller name:Vespa, Jeanette Relation to [id number] Call back [phone] [facility]  Reason for call:  Pt needs rx metoprolol (LOPRESSOR) 100 MG tablet

## 2014-12-07 NOTE — Telephone Encounter (Signed)
Med filled.  

## 2014-12-14 ENCOUNTER — Encounter: Payer: Self-pay | Admitting: Family Medicine

## 2014-12-17 LAB — HM DEXA SCAN: HM Dexa Scan: NORMAL

## 2014-12-30 ENCOUNTER — Encounter: Payer: Self-pay | Admitting: General Practice

## 2015-01-14 ENCOUNTER — Encounter: Payer: Self-pay | Admitting: General Practice

## 2015-01-17 ENCOUNTER — Encounter (INDEPENDENT_AMBULATORY_CARE_PROVIDER_SITE_OTHER): Payer: Medicare Other

## 2015-01-17 DIAGNOSIS — D72829 Elevated white blood cell count, unspecified: Secondary | ICD-10-CM

## 2015-01-17 LAB — CBC WITH DIFFERENTIAL/PLATELET
Basophils Absolute: 0.1 10*3/uL (ref 0.0–0.1)
Basophils Relative: 0.6 % (ref 0.0–3.0)
EOS ABS: 0.7 10*3/uL (ref 0.0–0.7)
Eosinophils Relative: 5.8 % — ABNORMAL HIGH (ref 0.0–5.0)
HCT: 39 % (ref 36.0–46.0)
HEMOGLOBIN: 13.3 g/dL (ref 12.0–15.0)
LYMPHS PCT: 25 % (ref 12.0–46.0)
Lymphs Abs: 2.8 10*3/uL (ref 0.7–4.0)
MCHC: 34.3 g/dL (ref 30.0–36.0)
MCV: 97.7 fl (ref 78.0–100.0)
MONO ABS: 1 10*3/uL (ref 0.1–1.0)
MONOS PCT: 9 % (ref 3.0–12.0)
NEUTROS ABS: 6.7 10*3/uL (ref 1.4–7.7)
NEUTROS PCT: 59.6 % (ref 43.0–77.0)
Platelets: 309 10*3/uL (ref 150.0–400.0)
RBC: 3.99 Mil/uL (ref 3.87–5.11)
RDW: 15.1 % (ref 11.5–15.5)
WBC: 11.3 10*3/uL — ABNORMAL HIGH (ref 4.0–10.5)

## 2015-01-18 NOTE — Progress Notes (Signed)
This encounter was created in error - please disregard.

## 2015-01-31 ENCOUNTER — Ambulatory Visit (INDEPENDENT_AMBULATORY_CARE_PROVIDER_SITE_OTHER): Payer: BLUE CROSS/BLUE SHIELD | Admitting: Podiatry

## 2015-01-31 DIAGNOSIS — B351 Tinea unguium: Secondary | ICD-10-CM

## 2015-01-31 DIAGNOSIS — M79673 Pain in unspecified foot: Secondary | ICD-10-CM

## 2015-01-31 DIAGNOSIS — M2041 Other hammer toe(s) (acquired), right foot: Secondary | ICD-10-CM

## 2015-01-31 DIAGNOSIS — E1142 Type 2 diabetes mellitus with diabetic polyneuropathy: Secondary | ICD-10-CM

## 2015-01-31 NOTE — Progress Notes (Signed)
Subjective:     Patient ID: Katelyn Mahoney, female   DOB: May 24, 1934, 81 y.o.   MRN: [id number]  HPI patient presents with elongated nail beds 1-5 both feet that she cannot take care of and are consistently sore   Review of Systems     Objective:   Physical Exam Neurovascular status unchanged with thick yellow brittle nailbeds 1-5 of both feet    Assessment:     Mycotic nail infection with pain 1-5 both feet    Plan:     Debridement painful nailbeds 1-5 both feet with no iatrogenic bleeding noted

## 2015-03-03 ENCOUNTER — Ambulatory Visit (INDEPENDENT_AMBULATORY_CARE_PROVIDER_SITE_OTHER): Payer: Medicare Other | Admitting: Family Medicine

## 2015-03-03 ENCOUNTER — Encounter: Payer: Self-pay | Admitting: Family Medicine

## 2015-03-03 ENCOUNTER — Other Ambulatory Visit: Payer: Self-pay | Admitting: General Practice

## 2015-03-03 VITALS — BP 126/80 | HR 80 | Temp 98.3°F | Resp 16 | Wt 261.5 lb

## 2015-03-03 DIAGNOSIS — E1121 Type 2 diabetes mellitus with diabetic nephropathy: Secondary | ICD-10-CM

## 2015-03-03 LAB — BASIC METABOLIC PANEL
BUN: 35 mg/dL — ABNORMAL HIGH (ref 6–23)
CALCIUM: 9.3 mg/dL (ref 8.4–10.5)
CO2: 26 meq/L (ref 19–32)
Chloride: 100 mEq/L (ref 96–112)
Creatinine, Ser: 1.3 mg/dL — ABNORMAL HIGH (ref 0.40–1.20)
GFR: 42.08 mL/min — ABNORMAL LOW (ref >60.00)
GLUCOSE: 206 mg/dL — AB (ref 70–99)
Potassium: 5.2 mEq/L — ABNORMAL HIGH (ref 3.5–5.1)
Sodium: 131 mEq/L — ABNORMAL LOW (ref 135–145)

## 2015-03-03 LAB — TSH: TSH: 1.16 u[IU]/mL (ref 0.35–4.50)

## 2015-03-03 LAB — HEMOGLOBIN A1C: HEMOGLOBIN A1C: 8.4 % — AB (ref 4.6–6.5)

## 2015-03-03 MED ORDER — GLIPIZIDE ER 5 MG PO TB24: 5.0000 mg | ORAL_TABLET | Freq: Every day | ORAL | Status: DC

## 2015-03-03 NOTE — Progress Notes (Signed)
   Subjective:    Patient ID: Katelyn Mahoney, female    DOB: [birth year], 81 y.o.   MRN: [id number]  HPI DM- chronic problem, on Januvia.  On ARB for renal protection.  UTD on eye exam.  Pt has gained nearly 20 lbs.  Pt reports CBGs are running 170-200 in the AM.  Pt stopped the Janumet due to diarrhea.  Still seeing Dr Paulla Dolly (Podiatry- seen last month).  Increased tingling of feet bilaterally.  Denies symptomatic lows.  No CP, SOB, HAs, visual changes, abd pain, N/V.   Review of Systems For ROS see HPI     Objective:   Physical Exam  Constitutional: She is oriented to person, place, and time. She appears well-developed and well-nourished. No distress.  HENT:  Head: Normocephalic and atraumatic.  Eyes: Conjunctivae and EOM are normal. Pupils are equal, round, and reactive to light.  Neck: Normal range of motion. Neck supple. No thyromegaly present.  Cardiovascular: Normal rate, regular rhythm, normal heart sounds and intact distal pulses.   No murmur heard. Pulmonary/Chest: Effort normal and breath sounds normal. No respiratory distress.  Abdominal: Soft. She exhibits no distension. There is no tenderness.  Musculoskeletal: She exhibits no edema.  Lymphadenopathy:    She has no cervical adenopathy.  Neurological: She is alert and oriented to person, place, and time.  Skin: Skin is warm and dry.  Psychiatric: She has a normal mood and affect. Her behavior is normal.  Vitals reviewed.         Assessment & Plan:

## 2015-03-03 NOTE — Patient Instructions (Signed)
Follow up in 3-4 months to recheck diabetes Once we have your lab results available to review, we'll determine what the next step is Continue the Januvia daily Try and make healthy food choices and get exercise as you are able Call with any questions or concerns Hang in there!!!

## 2015-03-03 NOTE — Progress Notes (Signed)
Pre visit review using our clinic review tool, if applicable. No additional management support is needed unless otherwise documented below in the visit note. 

## 2015-03-03 NOTE — Assessment & Plan Note (Signed)
Deteriorated.  Pt's CBGs are much higher since stopping Metformin component of Janumet.  Has gained nearly 20 lbs.  Asymptomatic.  UTD on eye exam, foot exam.  On ARB for renal protection.  Pt's GFR is borderline to start Invokana- will get updated BMP and determine if this is reasonable to try.  Will follow closely

## 2015-03-10 ENCOUNTER — Other Ambulatory Visit: Payer: Self-pay | Admitting: Family Medicine

## 2015-03-10 DIAGNOSIS — I10 Essential (primary) hypertension: Secondary | ICD-10-CM

## 2015-03-11 MED ORDER — METOPROLOL TARTRATE 100 MG PO TABS: 100.0000 mg | ORAL_TABLET | Freq: Two times a day (BID) | ORAL | Status: DC

## 2015-03-11 NOTE — Telephone Encounter (Signed)
Med filled.  

## 2015-04-11 ENCOUNTER — Other Ambulatory Visit (INDEPENDENT_AMBULATORY_CARE_PROVIDER_SITE_OTHER): Payer: Medicare Other

## 2015-04-11 DIAGNOSIS — E1121 Type 2 diabetes mellitus with diabetic nephropathy: Secondary | ICD-10-CM | POA: Diagnosis not present

## 2015-04-11 LAB — HEMOGLOBIN A1C: Hgb A1c MFr Bld: 7.9 % — ABNORMAL HIGH (ref 4.6–6.5)

## 2015-05-04 ENCOUNTER — Other Ambulatory Visit: Payer: Self-pay | Admitting: Family Medicine

## 2015-05-04 NOTE — Telephone Encounter (Signed)
Med filled.  

## 2015-05-16 ENCOUNTER — Ambulatory Visit: Payer: BLUE CROSS/BLUE SHIELD

## 2015-05-20 ENCOUNTER — Ambulatory Visit (INDEPENDENT_AMBULATORY_CARE_PROVIDER_SITE_OTHER): Payer: Medicare Other

## 2015-05-20 DIAGNOSIS — M79676 Pain in unspecified toe(s): Secondary | ICD-10-CM | POA: Diagnosis not present

## 2015-05-20 DIAGNOSIS — E1159 Type 2 diabetes mellitus with other circulatory complications: Secondary | ICD-10-CM | POA: Diagnosis not present

## 2015-05-20 DIAGNOSIS — B351 Tinea unguium: Secondary | ICD-10-CM | POA: Diagnosis not present

## 2015-05-20 DIAGNOSIS — E1151 Type 2 diabetes mellitus with diabetic peripheral angiopathy without gangrene: Secondary | ICD-10-CM

## 2015-05-20 NOTE — Progress Notes (Signed)
Patient ID: Katelyn Mahoney, female   DOB: 11-05-1934, 81 y.o.   MRN: [id number] Complaint:  Visit Type: Patient returns to my office for continued preventative foot care services. Complaint: Patient states" my nails have grown long and thick and become painful to walk and wear shoes" Patient has been diagnosed with DM with no complications. He presents for preventative foot care services. No changes to ROS  Podiatric Exam: Vascular: dorsalis pedis and posterior tibial pulses are palpable bilateral. Capillary return is immediate. Temperature gradient is WNL. Skin turgor WNL  Sensorium: Normal Semmes Weinstein monofilament test. Normal tactile sensation bilaterally. Nail Exam: Pt has thick disfigured discolored nails with subungual debris noted bilateral entire nail hallux through fifth toenails Ulcer Exam: There is no evidence of ulcer or pre-ulcerative changes or infection. Orthopedic Exam: Muscle tone and strength are WNL. No limitations in general ROM. No crepitus or effusions noted. Foot type and digits show no abnormalities. Bony prominences are unremarkable. Skin: No Porokeratosis. No infection or ulcers  Diagnosis:  Tinea unguium, Pain in right toe, pain in left toes  Treatment & Plan Procedures and Treatment: Consent by patient was obtained for treatment procedures. The patient understood the discussion of treatment and procedures well. All questions were answered thoroughly reviewed. Debridement of mycotic and hypertrophic toenails, 1 through 5 bilateral and clearing of subungual debris. No ulceration, no infection noted.  Return Visit-Office Procedure: Patient instructed to return to the office for a follow up visit 3 months for continued evaluation and treatment.

## 2015-06-01 LAB — HM DIABETES EYE EXAM

## 2015-06-15 ENCOUNTER — Other Ambulatory Visit: Payer: Self-pay | Admitting: General Practice

## 2015-06-15 MED ORDER — GLIPIZIDE ER 5 MG PO TB24: 5.0000 mg | ORAL_TABLET | Freq: Every day | ORAL | Status: DC

## 2015-07-05 LAB — HM MAMMOGRAPHY

## 2015-07-07 ENCOUNTER — Ambulatory Visit (INDEPENDENT_AMBULATORY_CARE_PROVIDER_SITE_OTHER): Payer: Medicare Other | Admitting: Family Medicine

## 2015-07-07 ENCOUNTER — Encounter: Payer: Self-pay | Admitting: Family Medicine

## 2015-07-07 VITALS — BP 128/80 | HR 82 | Temp 98.0°F | Resp 17 | Ht 65.0 in | Wt 279.0 lb

## 2015-07-07 DIAGNOSIS — E1169 Type 2 diabetes mellitus with other specified complication: Secondary | ICD-10-CM | POA: Diagnosis not present

## 2015-07-07 DIAGNOSIS — E1121 Type 2 diabetes mellitus with diabetic nephropathy: Secondary | ICD-10-CM

## 2015-07-07 DIAGNOSIS — I10 Essential (primary) hypertension: Secondary | ICD-10-CM | POA: Diagnosis not present

## 2015-07-07 DIAGNOSIS — E785 Hyperlipidemia, unspecified: Secondary | ICD-10-CM | POA: Diagnosis not present

## 2015-07-07 LAB — CBC WITH DIFFERENTIAL/PLATELET
Basophils Absolute: 0 10*3/uL (ref 0.0–0.1)
Basophils Relative: 0.3 % (ref 0.0–3.0)
EOS ABS: 0.4 10*3/uL (ref 0.0–0.7)
Eosinophils Relative: 3.1 % (ref 0.0–5.0)
HCT: 40.6 % (ref 36.0–46.0)
Hemoglobin: 14.1 g/dL (ref 12.0–15.0)
LYMPHS PCT: 21 % (ref 12.0–46.0)
Lymphs Abs: 2.5 10*3/uL (ref 0.7–4.0)
MCHC: 34.6 g/dL (ref 30.0–36.0)
MCV: 95.8 fl (ref 78.0–100.0)
Monocytes Absolute: 1 10*3/uL (ref 0.1–1.0)
Monocytes Relative: 8.1 % (ref 3.0–12.0)
NEUTROS PCT: 67.5 % (ref 43.0–77.0)
Neutro Abs: 8.1 10*3/uL — ABNORMAL HIGH (ref 1.4–7.7)
PLATELETS: 333 10*3/uL (ref 150.0–400.0)
RBC: 4.24 Mil/uL (ref 3.87–5.11)
RDW: 16.1 % — ABNORMAL HIGH (ref 11.5–15.5)
WBC: 12 10*3/uL — ABNORMAL HIGH (ref 4.0–10.5)

## 2015-07-07 LAB — BASIC METABOLIC PANEL
BUN: 40 mg/dL — AB (ref 6–23)
CHLORIDE: 105 meq/L (ref 96–112)
CO2: 23 mEq/L (ref 19–32)
CREATININE: 1.39 mg/dL — AB (ref 0.40–1.20)
Calcium: 9.6 mg/dL (ref 8.4–10.5)
GFR: 38.92 mL/min — ABNORMAL LOW (ref >60.00)
Glucose, Bld: 182 mg/dL — ABNORMAL HIGH (ref 70–99)
Potassium: 4.8 mEq/L (ref 3.5–5.1)
Sodium: 135 mEq/L (ref 135–145)

## 2015-07-07 LAB — TSH: TSH: 1.28 u[IU]/mL (ref 0.35–4.50)

## 2015-07-07 LAB — LIPID PANEL
CHOLESTEROL: 179 mg/dL (ref 0–200)
HDL: 56.3 mg/dL (ref >39.00)
LDL CALC: 95 mg/dL (ref 0–99)
NonHDL: 122.7
TRIGLYCERIDES: 139 mg/dL (ref 0.0–149.0)
Total CHOL/HDL Ratio: 3
VLDL: 27.8 mg/dL (ref 0.0–40.0)

## 2015-07-07 LAB — HEPATIC FUNCTION PANEL
ALT: 20 U/L (ref 0–35)
AST: 21 U/L (ref 0–37)
Albumin: 3.5 g/dL (ref 3.5–5.2)
Alkaline Phosphatase: 69 U/L (ref 39–117)
Bilirubin, Direct: 0.1 mg/dL (ref 0.0–0.3)
Total Bilirubin: 0.6 mg/dL (ref 0.2–1.2)
Total Protein: 7.1 g/dL (ref 6.0–8.3)

## 2015-07-07 LAB — HEMOGLOBIN A1C: Hgb A1c MFr Bld: 7.2 % — ABNORMAL HIGH (ref 4.6–6.5)

## 2015-07-07 MED ORDER — ONETOUCH DELICA LANCETS 33G MISC: Status: DC

## 2015-07-07 MED ORDER — LOSARTAN POTASSIUM 100 MG PO TABS: 100.0000 mg | ORAL_TABLET | Freq: Every day | ORAL | Status: DC

## 2015-07-07 MED ORDER — SERTRALINE HCL 100 MG PO TABS: 100.0000 mg | ORAL_TABLET | Freq: Every day | ORAL | Status: DC

## 2015-07-07 MED ORDER — HAIR/SKIN/NAILS PO TABS: 1.0000 | ORAL_TABLET | Freq: Every day | ORAL | Status: DC

## 2015-07-07 MED ORDER — ATORVASTATIN CALCIUM 20 MG PO TABS: ORAL_TABLET | ORAL | Status: DC

## 2015-07-07 MED ORDER — METOPROLOL TARTRATE 100 MG PO TABS: 100.0000 mg | ORAL_TABLET | Freq: Two times a day (BID) | ORAL | Status: DC

## 2015-07-07 MED ORDER — GLUCOSE BLOOD VI STRP: ORAL_STRIP | Status: DC

## 2015-07-07 MED ORDER — SITAGLIPTIN PHOSPHATE 50 MG PO TABS: 50.0000 mg | ORAL_TABLET | Freq: Every day | ORAL | Status: DC

## 2015-07-07 NOTE — Progress Notes (Signed)
   Subjective:    Patient ID: Katelyn Mahoney, female    DOB: July 25, 1934, 81 y.o.   MRN: [id number]  HPI DM- chronic problem, on Glipizide and Januvia.  On ARB for renal protection.  UTD on foot exam.  Due for eye exam- pt plans to schedule.  Pt has gained 17 lbs.  Not exercising.  Denies symptomatic lows.  No numbness/tingling of hands/feet.  HTN- chronic problem, on Losartan, Metoprolol.  Well controlled today.  Denies CP, SOB, HAs, visual changes, edema.  Hyperlipidemia- chronic problem, on Lipitor.  Denies abd pain, N/V, myalgias.   Review of Systems For ROS see HPI     Objective:   Physical Exam  Constitutional: She is oriented to person, place, and time. She appears well-developed and well-nourished. No distress.  obese  HENT:  Head: Normocephalic and atraumatic.  Eyes: Conjunctivae and EOM are normal. Pupils are equal, round, and reactive to light.  Neck: Normal range of motion. Neck supple. No thyromegaly present.  Cardiovascular: Normal rate, regular rhythm, normal heart sounds and intact distal pulses.   No murmur heard. Pulmonary/Chest: Effort normal and breath sounds normal. No respiratory distress.  Abdominal: Soft. She exhibits no distension. There is no tenderness.  Musculoskeletal: She exhibits no edema.  Lymphadenopathy:    She has no cervical adenopathy.  Neurological: She is alert and oriented to person, place, and time.  Skin: Skin is warm and dry.  Psychiatric: She has a normal mood and affect. Her behavior is normal.  Vitals reviewed.         Assessment & Plan:

## 2015-07-07 NOTE — Patient Instructions (Signed)
Follow up in 3-4 months to recheck diabetes We'll notify you of your lab results and make any changes if needed Try and get regular activity and make healthy food choices Call with any questions or concerns Have a great summer!!!

## 2015-07-07 NOTE — Progress Notes (Signed)
Pre visit review using our clinic review tool, if applicable. No additional management support is needed unless otherwise documented below in the visit note. 

## 2015-07-08 ENCOUNTER — Other Ambulatory Visit: Payer: Self-pay | Admitting: Family Medicine

## 2015-07-08 DIAGNOSIS — R7989 Other specified abnormal findings of blood chemistry: Secondary | ICD-10-CM

## 2015-07-12 ENCOUNTER — Encounter: Payer: Self-pay | Admitting: General Practice

## 2015-07-17 NOTE — Assessment & Plan Note (Signed)
Chronic problem.  Due for eye exam- pt plans to schedule.  On ARB for renal protection.  Pt has gained 17 lbs- stressed need for low carb diet and exercise as able.  Check labs.  Adjust meds prn

## 2015-07-17 NOTE — Assessment & Plan Note (Signed)
Chronic problem.  Adequate control.  Asymptomatic.  Check labs.  No anticipated med changes 

## 2015-07-17 NOTE — Assessment & Plan Note (Signed)
Chronic problem.  Tolerating statin w/o difficulty.  Check labs.  Adjust meds prn  

## 2015-08-15 ENCOUNTER — Other Ambulatory Visit (INDEPENDENT_AMBULATORY_CARE_PROVIDER_SITE_OTHER): Payer: Medicare Other

## 2015-08-15 DIAGNOSIS — R748 Abnormal levels of other serum enzymes: Secondary | ICD-10-CM | POA: Diagnosis not present

## 2015-08-15 DIAGNOSIS — R7989 Other specified abnormal findings of blood chemistry: Secondary | ICD-10-CM

## 2015-08-15 LAB — BASIC METABOLIC PANEL
BUN: 40 mg/dL — AB (ref 6–23)
CALCIUM: 9.7 mg/dL (ref 8.4–10.5)
CO2: 25 meq/L (ref 19–32)
Chloride: 101 mEq/L (ref 96–112)
Creatinine, Ser: 1.36 mg/dL — ABNORMAL HIGH (ref 0.40–1.20)
GFR: 39.9 mL/min — ABNORMAL LOW (ref >60.00)
GLUCOSE: 160 mg/dL — AB (ref 70–99)
Potassium: 5.2 mEq/L — ABNORMAL HIGH (ref 3.5–5.1)
Sodium: 134 mEq/L — ABNORMAL LOW (ref 135–145)

## 2015-08-17 ENCOUNTER — Encounter: Payer: Self-pay | Admitting: Family Medicine

## 2015-08-18 ENCOUNTER — Ambulatory Visit (INDEPENDENT_AMBULATORY_CARE_PROVIDER_SITE_OTHER): Payer: Medicare Other | Admitting: Podiatry

## 2015-08-18 ENCOUNTER — Encounter: Payer: Self-pay | Admitting: Podiatry

## 2015-08-18 DIAGNOSIS — E1159 Type 2 diabetes mellitus with other circulatory complications: Secondary | ICD-10-CM

## 2015-08-18 DIAGNOSIS — M79676 Pain in unspecified toe(s): Secondary | ICD-10-CM | POA: Diagnosis not present

## 2015-08-18 DIAGNOSIS — B351 Tinea unguium: Secondary | ICD-10-CM | POA: Diagnosis not present

## 2015-08-18 DIAGNOSIS — E1151 Type 2 diabetes mellitus with diabetic peripheral angiopathy without gangrene: Secondary | ICD-10-CM

## 2015-08-18 NOTE — Progress Notes (Signed)
Patient ID: Katelyn Mahoney, female   DOB: [birth year], 81 y.o.   MRN: [id number] Complaint:  Visit Type: Patient returns to my office for continued preventative foot care services. Complaint: Patient states" my nails have grown long and thick and become painful to walk and wear shoes" Patient has been diagnosed with DM with no complications. He presents for preventative foot care services. No changes to ROS  Podiatric Exam: Vascular: dorsalis pedis and posterior tibial pulses are palpable bilateral. Capillary return is immediate. Temperature gradient is WNL. Skin turgor WNL  Sensorium: Normal Semmes Weinstein monofilament test. Normal tactile sensation bilaterally. Nail Exam: Pt has thick disfigured discolored nails with subungual debris noted bilateral entire nail hallux through fifth toenails Ulcer Exam: There is no evidence of ulcer or pre-ulcerative changes or infection. Orthopedic Exam: Muscle tone and strength are WNL. No limitations in general ROM. No crepitus or effusions noted. Foot type and digits show no abnormalities. Bony prominences are unremarkable. Skin: No Porokeratosis. No infection or ulcers  Diagnosis:  Tinea unguium, Pain in right toe, pain in left toes  Treatment & Plan Procedures and Treatment: Consent by patient was obtained for treatment procedures. The patient understood the discussion of treatment and procedures well. All questions were answered thoroughly reviewed. Debridement of mycotic and hypertrophic toenails, 1 through 5 bilateral and clearing of subungual debris. No ulceration, no infection noted.  Return Visit-Office Procedure: Patient instructed to return to the office for a follow up visit 3 months for continued evaluation and treatment.

## 2015-09-12 ENCOUNTER — Other Ambulatory Visit: Payer: Self-pay | Admitting: Family Medicine

## 2015-09-12 NOTE — Telephone Encounter (Signed)
Medication filled to pharmacy as requested.   

## 2015-09-20 ENCOUNTER — Ambulatory Visit (INDEPENDENT_AMBULATORY_CARE_PROVIDER_SITE_OTHER): Payer: Medicare Other

## 2015-09-20 DIAGNOSIS — Z23 Encounter for immunization: Secondary | ICD-10-CM | POA: Diagnosis not present

## 2015-09-20 NOTE — Progress Notes (Signed)
Pre visit review using our clinic review tool, if applicable. No additional management support is needed unless otherwise documented below in the visit note.  Patient tolerated injection well.  

## 2015-10-04 ENCOUNTER — Ambulatory Visit (INDEPENDENT_AMBULATORY_CARE_PROVIDER_SITE_OTHER): Payer: Medicare Other | Admitting: Emergency Medicine

## 2015-10-04 ENCOUNTER — Ambulatory Visit (INDEPENDENT_AMBULATORY_CARE_PROVIDER_SITE_OTHER): Payer: Medicare Other

## 2015-10-04 ENCOUNTER — Ambulatory Visit (HOSPITAL_COMMUNITY)
Admission: RE | Admit: 2015-10-04 | Discharge: 2015-10-04 | Disposition: A | Discharge status: Home / Self Care | Payer: Medicare Other | Source: Ambulatory Visit | Attending: Cardiovascular Disease | Admitting: Cardiovascular Disease

## 2015-10-04 VITALS — BP 128/80 | HR 88 | Temp 98.5°F | Resp 16 | Ht 66.0 in | Wt 288.0 lb

## 2015-10-04 DIAGNOSIS — M79604 Pain in right leg: Secondary | ICD-10-CM

## 2015-10-04 DIAGNOSIS — E119 Type 2 diabetes mellitus without complications: Secondary | ICD-10-CM | POA: Diagnosis not present

## 2015-10-04 DIAGNOSIS — I10 Essential (primary) hypertension: Secondary | ICD-10-CM | POA: Diagnosis not present

## 2015-10-04 DIAGNOSIS — E785 Hyperlipidemia, unspecified: Secondary | ICD-10-CM | POA: Insufficient documentation

## 2015-10-04 NOTE — Progress Notes (Addendum)
Subjective:  Patient ID: Katelyn Mahoney, female    DOB: 01/26/1934  Age: 81 y.o. MRN: [id number]  CC: Leg Pain and Foot Pain   HPI Katelyn Mahoney presents  following a fall in the spring. She's continued to have pain in her right lower leg. Now acutely she's been having swelling and pain in her right calf posteriorly no history of injury or overuse recently. She's not very active at home sits frequently in fact sits to cook.  History Katelyn Mahoney has a past medical history of Allergy; Arthritis; Diabetes mellitus without complication ([place]); Hypertension; Callus; Depression; Arthritis; Anxiety; and Hyperlipidemia.   She has past surgical history that includes Cholecystectomy; Abdominal hysterectomy; and Splenectomy.   Her  family history includes Cancer in her brother, father, mother, and sister.  She   reports that she has never smoked. She has never used smokeless tobacco. She reports that she does not drink alcohol or use illicit drugs.  Outpatient Prescriptions Prior to Visit  Medication Sig Dispense Refill  . acetaminophen (TYLENOL ARTHRITIS PAIN) 650 MG CR tablet 2 by mouth once daily as needed    . AMBULATORY NON FORMULARY MEDICATION Medication Name: Shark Cartilage 750 mg 3 by mouth two times daily    . aspirin 81 MG tablet Take 81 mg by mouth. 3 by mouth in the evening    . atorvastatin (LIPITOR) 20 MG tablet TAKE 1/2 TABLET BY MOUTH MONDAY, WEDNESDAY & FRIDAY AND SUNDAY 26 tablet 3  . Bilberry, Vaccinium myrtillus, (BILBERRY PO) Take 470 mg by mouth. 2 by mouth daily    . FLECTOR 1.3 % PTCH APPLY 1 PATCH ONTO THE SKIN TWICE A DAY 30 patch 2  . glipiZIDE (GLUCOTROL XL) 5 MG 24 hr tablet Take 1 tablet (5 mg total) by mouth daily with breakfast. 30 tablet 6  . glucose blood (ONE TOUCH TEST STRIPS) test strip 1 each by Other route daily. E11.21 Ultra blue 100 each 3  . losartan (COZAAR) 100 MG tablet Take 1 tablet (100 mg total) by mouth daily. 90 tablet 3  . metoprolol  (LOPRESSOR) 100 MG tablet Take 1 tablet (100 mg total) by mouth 2 (two) times daily. 180 tablet 0  . Multiple Vitamins-Minerals (CENTRUM SILVER ULTRA WOMENS) TABS Take 1 tablet by mouth daily. 90 tablet 3  . Multiple Vitamins-Minerals (HAIR/SKIN/NAILS) TABS Take 1 tablet by mouth daily. 90 each 3  . multivitamin-lutein (OCUVITE-LUTEIN) CAPS capsule Take 1 capsule by mouth daily. 90 capsule 3  . ONETOUCH DELICA LANCETS 99991111 MISC Use to test blood sugar once daily- E11.21 100 each 3  . OVER THE COUNTER MEDICATION [facility] Colon [facility]  Take 1 table daily.    . sertraline (ZOLOFT) 100 MG tablet Take 1 tablet (100 mg total) by mouth daily. 90 tablet 1  . sitaGLIPtin (JANUVIA) 50 MG tablet Take 1 tablet (50 mg total) by mouth daily. 90 tablet 1   No facility-administered medications prior to visit.    Social History   Social History  . Marital Status: Widowed    Spouse Name: N/A  . Number of Children: N/A  . Years of Education: N/A   Social History Main Topics  . Smoking status: Never Smoker   . Smokeless tobacco: Never Used  . Alcohol Use: No  . Drug Use: No  . Sexual Activity: No   Other Topics Concern  . None   Social History Narrative     Review of Systems  Constitutional: Negative for fever, chills and appetite change.  HENT: Negative for congestion, ear pain, postnasal drip, sinus pressure and sore throat.   Eyes: Negative for pain and redness.  Respiratory: Negative for cough, shortness of breath and wheezing.   Cardiovascular: Negative for leg swelling.  Gastrointestinal: Negative for nausea, vomiting, abdominal pain, diarrhea, constipation and blood in stool.  Endocrine: Negative for polyuria.  Genitourinary: Negative for dysuria, urgency, frequency and flank pain.  Musculoskeletal: Positive for myalgias. Negative for gait problem.  Skin: Negative for rash.  Neurological: Negative for weakness and headaches.  Psychiatric/Behavioral: Negative for confusion and  decreased concentration. The patient is not nervous/anxious.     Objective:  BP 128/80 mmHg  Pulse 88  Temp(Src) 98.5 F (36.9 C) (Oral)  Resp 16  Ht 5\' 6"  (1.676 m)  Wt 288 lb (130.636 kg)  BMI 46.51 kg/m2  SpO2 93%  Physical Exam  Constitutional: She is oriented to person, place, and time. She appears well-developed and well-nourished. No distress.  HENT:  Head: Normocephalic and atraumatic.  Right Ear: External ear normal.  Left Ear: External ear normal.  Nose: Nose normal.  Eyes: Conjunctivae and EOM are normal. Pupils are equal, round, and reactive to light. No scleral icterus.  Neck: Normal range of motion. Neck supple. No tracheal deviation present.  Cardiovascular: Normal rate, regular rhythm and normal heart sounds.   Pulmonary/Chest: Effort normal. No respiratory distress. She has no wheezes. She has no rales.  Abdominal: She exhibits no mass. There is no tenderness. There is no rebound and no guarding.  Musculoskeletal: She exhibits no edema.       Right lower leg: She exhibits tenderness and swelling.  Lymphadenopathy:    She has no cervical adenopathy.  Neurological: She is alert and oriented to person, place, and time. Coordination normal.  Skin: Skin is warm and dry. No rash noted.  Psychiatric: She has a normal mood and affect. Her behavior is normal.      Assessment & Plan:   Katelyn Mahoney was seen today for leg pain and foot pain.  Diagnoses and all orders for this visit:  Pain of right lower extremity -     DG Tibia/Fibula Right; Future -     VAS [place] LOWER EXTREMITY VENOUS (DVT); Future   I am having Katelyn Mahoney maintain her aspirin, AMBULATORY NON FORMULARY MEDICATION, acetaminophen, (Bilberry, Vaccinium myrtillus, (BILBERRY PO)), CENTRUM SILVER ULTRA WOMENS, multivitamin-lutein, OVER THE COUNTER MEDICATION, glipiZIDE, glucose blood, losartan, metoprolol, ONETOUCH DELICA LANCETS 99991111, sertraline, atorvastatin, HAIR/SKIN/NAILS, sitaGLIPtin, and  FLECTOR.  No orders of the defined types were placed in this encounter.    Appropriate red flag conditions were discussed with the patient as well as actions that should be taken.  Patient expressed his understanding.  Follow-up: Return if symptoms worsen or fail to improve.  Roselee Culver, MD  [facility] reading (PRIMARY) by  Dr. Ouida Sills.  negative.

## 2015-10-04 NOTE — Patient Instructions (Signed)
[facility]  Address: [address] [place], [place], [place] [address]  Phone: [phone]

## 2015-10-06 ENCOUNTER — Telehealth: Payer: Self-pay

## 2015-10-06 NOTE — Telephone Encounter (Signed)
Pt called requesting venous doppler results from 10/4. Pt is still having calf pain and would like to know what the next step is.

## 2015-10-13 ENCOUNTER — Encounter: Payer: Self-pay | Admitting: Family Medicine

## 2015-10-14 ENCOUNTER — Encounter: Payer: Self-pay | Admitting: Family Medicine

## 2015-10-14 ENCOUNTER — Ambulatory Visit (INDEPENDENT_AMBULATORY_CARE_PROVIDER_SITE_OTHER): Payer: Medicare Other | Admitting: Family Medicine

## 2015-10-14 VITALS — BP 126/86 | HR 76 | Temp 98.1°F | Resp 16 | Wt 286.5 lb

## 2015-10-14 DIAGNOSIS — M79604 Pain in right leg: Secondary | ICD-10-CM | POA: Diagnosis not present

## 2015-10-14 MED ORDER — HYDROCODONE-ACETAMINOPHEN 5-325 MG PO TABS: 1.0000 | ORAL_TABLET | Freq: Four times a day (QID) | ORAL | Status: DC | PRN

## 2015-10-14 NOTE — Progress Notes (Signed)
Pre visit review using our clinic review tool, if applicable. No additional management support is needed unless otherwise documented below in the visit note. 

## 2015-10-14 NOTE — Progress Notes (Signed)
   Subjective:    Patient ID: Katelyn Mahoney, female    DOB: 10/31/1934, 81 y.o.   MRN: [id number]  HPI R leg pain- pt was seen at [facility] on 10/4 and had xray and doppler- both were negative.  After fracture and DVT were ruled out, 'i didn't hear anything from them'.  Pain is localized to posterior R calf but pain will radiate down lateral leg and into foot and also up above knee into lower thigh.  Mild swelling of lower leg.  Pain is worse in cold temps.  Leg is not tender to touch.  Leg is only comfortable when she has it extended 'straight out'.  No improvement w/ tylenol.   Review of Systems For ROS see HPI     Objective:   Physical Exam  Constitutional: She is oriented to person, place, and time. She appears well-developed and well-nourished. No distress.  HENT:  Head: Normocephalic and atraumatic.  Cardiovascular: Intact distal pulses.   Musculoskeletal: She exhibits no edema or tenderness (no TTP over posterior calf, lower leg, or distal thigh).  Pain w/ weight bearing  Neurological: She is alert and oriented to person, place, and time.  Skin: Skin is warm and dry. No erythema.  Psychiatric: She has a normal mood and affect. Her behavior is normal.  Vitals reviewed.         Assessment & Plan:

## 2015-10-14 NOTE — Patient Instructions (Signed)
Follow up as scheduled for your diabetes We'll call you with your orthopedic appt Alternate ice/heat for your leg pain Start the Hydrocodone as needed for severe pain- do not take additional tylenol when on this Call with any questions or concerns Hang in there!!!

## 2015-10-14 NOTE — Assessment & Plan Note (Signed)
New.  Reviewed pt's UC note, xrays, and doppler- pt's xray shows significant medial compartment R knee degeneration.  Suspect that this arthritic change is causing her pain that is radiating both up and down her leg.  Reassured pt that she does not have a clot or a fracture but that she will need an orthopedic evaluation for ongoing tx.  Referral provided.  Pain meds prn.  Reviewed supportive care and red flags that should prompt return.  Pt expressed understanding and is in agreement w/ plan.

## 2015-11-08 ENCOUNTER — Other Ambulatory Visit: Payer: Self-pay | Admitting: Family Medicine

## 2015-11-08 ENCOUNTER — Ambulatory Visit (INDEPENDENT_AMBULATORY_CARE_PROVIDER_SITE_OTHER): Payer: Medicare Other | Admitting: Family Medicine

## 2015-11-08 ENCOUNTER — Encounter: Payer: Self-pay | Admitting: Family Medicine

## 2015-11-08 VITALS — BP 126/82 | HR 85 | Temp 98.0°F | Resp 16 | Ht 66.0 in | Wt 285.1 lb

## 2015-11-08 DIAGNOSIS — E1121 Type 2 diabetes mellitus with diabetic nephropathy: Secondary | ICD-10-CM | POA: Diagnosis not present

## 2015-11-08 DIAGNOSIS — B369 Superficial mycosis, unspecified: Secondary | ICD-10-CM | POA: Diagnosis not present

## 2015-11-08 DIAGNOSIS — I10 Essential (primary) hypertension: Secondary | ICD-10-CM | POA: Diagnosis not present

## 2015-11-08 DIAGNOSIS — R7989 Other specified abnormal findings of blood chemistry: Secondary | ICD-10-CM

## 2015-11-08 LAB — BASIC METABOLIC PANEL
BUN: 47 mg/dL — ABNORMAL HIGH (ref 6–23)
CALCIUM: 9.3 mg/dL (ref 8.4–10.5)
CO2: 22 meq/L (ref 19–32)
CREATININE: 1.58 mg/dL — AB (ref 0.40–1.20)
Chloride: 104 mEq/L (ref 96–112)
GFR: 33.54 mL/min — AB (ref >60.00)
GLUCOSE: 341 mg/dL — AB (ref 70–99)
Potassium: 5.5 mEq/L — ABNORMAL HIGH (ref 3.5–5.1)
Sodium: 134 mEq/L — ABNORMAL LOW (ref 135–145)

## 2015-11-08 LAB — HEMOGLOBIN A1C: Hgb A1c MFr Bld: 8 % — ABNORMAL HIGH (ref 4.6–6.5)

## 2015-11-08 MED ORDER — METOPROLOL TARTRATE 100 MG PO TABS: 100.0000 mg | ORAL_TABLET | Freq: Two times a day (BID) | ORAL | Status: DC

## 2015-11-08 MED ORDER — NYSTATIN 100000 UNIT/GM EX OINT: 1.0000 "application " | TOPICAL_OINTMENT | Freq: Two times a day (BID) | CUTANEOUS | Status: DC

## 2015-11-08 NOTE — Progress Notes (Signed)
Pre visit review using our clinic review tool, if applicable. No additional management support is needed unless otherwise documented below in the visit note. 

## 2015-11-08 NOTE — Assessment & Plan Note (Signed)
New.  Pt w/ fungal dermatitis and skin breakdown under R breast but no evidence of superimposed bacterial infection.  Start Nystatin ointment.  Reviewed supportive care and red flags that should prompt return.  Pt expressed understanding and is in agreement w/ plan.

## 2015-11-08 NOTE — Assessment & Plan Note (Signed)
Refill provided

## 2015-11-08 NOTE — Patient Instructions (Signed)
Schedule your complete physical in 3-4 months We'll notify you of your lab results and make any changes if needed Continue to work on healthy diet and regular exercise Apply the Nystatin ointment twice daily to the skin rash until area improves Call with any questions or concerns If you want to join [place] at the new [place] office, any scheduled appointments will automatically transfer and we will see you at [address] [place] [address] [place], [place] [address]  Happy Holidays!!!

## 2015-11-08 NOTE — Assessment & Plan Note (Signed)
Chronic problem.  Pt has not been checking sugars.  Denies symptomatic lows.  Asymptomatic.  UTD on eye exam, foot exam.  Check labs.  Adjust meds prn

## 2015-11-08 NOTE — Progress Notes (Signed)
   Subjective:    Patient ID: Katelyn Mahoney, female    DOB: 1934-09-17, 81 y.o.   MRN: [id number]  HPI DM- chronic problem, on Glipizide and Januvia.  On Losartan for renal protection.  UTD on foot exam, eye exam.  Not checking CBGs recently.  Denies symptomatic lows.  No CP, SOB, HAs, visual changes, abd pain, N/V, numbness/tingling of hands/feet.  Fungal dermatitis- under R breast w/ skin break.  + pain, no evidence of superimposed infxn   Review of Systems For ROS see HPI     Objective:   Physical Exam  Constitutional: She is oriented to person, place, and time. She appears well-developed and well-nourished. No distress.  obese  HENT:  Head: Normocephalic and atraumatic.  Eyes: Conjunctivae and EOM are normal. Pupils are equal, round, and reactive to light.  Neck: Normal range of motion. Neck supple. No thyromegaly present.  Cardiovascular: Normal rate, regular rhythm, normal heart sounds and intact distal pulses.   No murmur heard. Pulmonary/Chest: Effort normal and breath sounds normal. No respiratory distress.  Abdominal: Soft. She exhibits no distension. There is no tenderness.  Musculoskeletal: She exhibits no edema.  Lymphadenopathy:    She has no cervical adenopathy.  Neurological: She is alert and oriented to person, place, and time.  Skin: Skin is warm and dry.  Fungal dermatitis w/ skin breakdown under R breast  Psychiatric: She has a normal mood and affect. Her behavior is normal.  Vitals reviewed.         Assessment & Plan:

## 2015-11-17 ENCOUNTER — Ambulatory Visit (INDEPENDENT_AMBULATORY_CARE_PROVIDER_SITE_OTHER): Payer: Medicare Other | Admitting: Podiatry

## 2015-11-17 ENCOUNTER — Encounter: Payer: Self-pay | Admitting: Podiatry

## 2015-11-17 DIAGNOSIS — B351 Tinea unguium: Secondary | ICD-10-CM

## 2015-11-17 DIAGNOSIS — E1151 Type 2 diabetes mellitus with diabetic peripheral angiopathy without gangrene: Secondary | ICD-10-CM

## 2015-11-17 DIAGNOSIS — M79676 Pain in unspecified toe(s): Secondary | ICD-10-CM | POA: Diagnosis not present

## 2015-11-17 NOTE — Progress Notes (Signed)
Patient ID: Katelyn Mahoney, female   DOB: 1934/06/23, 81 y.o.   MRN: [id number] Complaint:  Visit Type: Patient returns to my office for continued preventative foot care services. Complaint: Patient states" my nails have grown long and thick and become painful to walk and wear shoes" Patient has been diagnosed with DM with no complications. He presents for preventative foot care services. No changes to ROS  Podiatric Exam: Vascular: dorsalis pedis and posterior tibial pulses are palpable bilateral. Capillary return is immediate. Temperature gradient is WNL. Skin turgor WNL  Sensorium: Normal Semmes Weinstein monofilament test. Normal tactile sensation bilaterally. Nail Exam: Pt has thick disfigured discolored nails with subungual debris noted bilateral entire nail hallux through fifth toenails Ulcer Exam: There is no evidence of ulcer or pre-ulcerative changes or infection. Orthopedic Exam: Muscle tone and strength are WNL. No limitations in general ROM. No crepitus or effusions noted. Foot type and digits show no abnormalities. Bony prominences are unremarkable. Skin: No Porokeratosis. No infection or ulcers  Diagnosis:  Tinea unguium, Pain in right toe, pain in left toes  Treatment & Plan Procedures and Treatment: Consent by patient was obtained for treatment procedures. The patient understood the discussion of treatment and procedures well. All questions were answered thoroughly reviewed. Debridement of mycotic and hypertrophic toenails, 1 through 5 bilateral and clearing of subungual debris. No ulceration, no infection noted.  Return Visit-Office Procedure: Patient instructed to return to the office for a follow up visit 3 months for continued evaluation and treatment.  Gardiner Barefoot DPM

## 2015-11-21 ENCOUNTER — Other Ambulatory Visit (INDEPENDENT_AMBULATORY_CARE_PROVIDER_SITE_OTHER): Payer: Medicare Other

## 2015-11-21 DIAGNOSIS — R748 Abnormal levels of other serum enzymes: Secondary | ICD-10-CM

## 2015-11-21 DIAGNOSIS — R7989 Other specified abnormal findings of blood chemistry: Secondary | ICD-10-CM

## 2015-11-21 LAB — BASIC METABOLIC PANEL
BUN: 40 mg/dL — AB (ref 6–23)
CHLORIDE: 102 meq/L (ref 96–112)
CO2: 22 meq/L (ref 19–32)
CREATININE: 1.42 mg/dL — AB (ref 0.40–1.20)
Calcium: 9.3 mg/dL (ref 8.4–10.5)
GFR: 37.93 mL/min — ABNORMAL LOW (ref >60.00)
Glucose, Bld: 181 mg/dL — ABNORMAL HIGH (ref 70–99)
Potassium: 5.3 mEq/L — ABNORMAL HIGH (ref 3.5–5.1)
Sodium: 131 mEq/L — ABNORMAL LOW (ref 135–145)

## 2016-01-07 ENCOUNTER — Other Ambulatory Visit: Payer: Self-pay | Admitting: Family Medicine

## 2016-02-02 ENCOUNTER — Telehealth: Payer: Self-pay | Admitting: Family Medicine

## 2016-02-02 NOTE — Telephone Encounter (Signed)
Pt dropped off paperwork to be filled out Building services engineer)

## 2016-02-03 ENCOUNTER — Telehealth: Payer: Self-pay | Admitting: *Deleted

## 2016-02-03 NOTE — Telephone Encounter (Signed)
Patient dropped off Handicap Placard Application to be mailed; forwarded to provider/SLS 02/03

## 2016-02-06 NOTE — Telephone Encounter (Signed)
Patient informed Handicap Placard ready for pick-up during regular business hours, understood & agreed/SLS 02/06

## 2016-02-07 ENCOUNTER — Other Ambulatory Visit: Payer: Self-pay

## 2016-02-07 MED ORDER — GLIPIZIDE ER 5 MG PO TB24: 5.0000 mg | ORAL_TABLET | Freq: Every day | ORAL | Status: DC

## 2016-02-14 ENCOUNTER — Encounter: Payer: Self-pay | Admitting: General Practice

## 2016-02-16 ENCOUNTER — Encounter: Payer: Self-pay | Admitting: Podiatry

## 2016-02-16 ENCOUNTER — Ambulatory Visit (INDEPENDENT_AMBULATORY_CARE_PROVIDER_SITE_OTHER): Payer: Medicare Other | Admitting: Podiatry

## 2016-02-16 DIAGNOSIS — E1151 Type 2 diabetes mellitus with diabetic peripheral angiopathy without gangrene: Secondary | ICD-10-CM | POA: Diagnosis not present

## 2016-02-16 DIAGNOSIS — B351 Tinea unguium: Secondary | ICD-10-CM | POA: Diagnosis not present

## 2016-02-16 DIAGNOSIS — M79676 Pain in unspecified toe(s): Secondary | ICD-10-CM

## 2016-02-16 NOTE — Progress Notes (Signed)
Patient ID: Katelyn Mahoney, female   DOB: [birth year], 82 y.o.   MRN: [id number] Complaint:  Visit Type: Patient returns to my office for continued preventative foot care services. Complaint: Patient states" my nails have grown long and thick and become painful to walk and wear shoes" Patient has been diagnosed with DM with no complications. He presents for preventative foot care services. No changes to ROS  Podiatric Exam: Vascular: dorsalis pedis and posterior tibial pulses are palpable bilateral. Capillary return is immediate. Temperature gradient is WNL. Skin turgor WNL  Sensorium: Normal Semmes Weinstein monofilament test. Normal tactile sensation bilaterally. Nail Exam: Pt has thick disfigured discolored nails with subungual debris noted bilateral entire nail hallux through fifth toenails Ulcer Exam: There is no evidence of ulcer or pre-ulcerative changes or infection. Orthopedic Exam: Muscle tone and strength are WNL. No limitations in general ROM. No crepitus or effusions noted. Foot type and digits show no abnormalities. Bony prominences are unremarkable. Skin: No Porokeratosis. No infection or ulcers  Diagnosis:  Tinea unguium, Pain in right toe, pain in left toes  Treatment & Plan Procedures and Treatment: Consent by patient was obtained for treatment procedures. The patient understood the discussion of treatment and procedures well. All questions were answered thoroughly reviewed. Debridement of mycotic and hypertrophic toenails, 1 through 5 bilateral and clearing of subungual debris. No ulceration, no infection noted.  Return Visit-Office Procedure: Patient instructed to return to the office for a follow up visit 3 months for continued evaluation and treatment.  Gardiner Barefoot DPM

## 2016-03-30 ENCOUNTER — Encounter: Payer: Self-pay | Admitting: *Deleted

## 2016-03-30 ENCOUNTER — Telehealth: Payer: Self-pay | Admitting: *Deleted

## 2016-03-30 NOTE — Telephone Encounter (Signed)
Pre-Visit Call completed with patient and chart updated.   Pre-Visit Info documented in Specialty Comments under [employer].    

## 2016-04-02 ENCOUNTER — Ambulatory Visit (INDEPENDENT_AMBULATORY_CARE_PROVIDER_SITE_OTHER): Payer: Medicare Other | Admitting: Family Medicine

## 2016-04-02 ENCOUNTER — Encounter: Payer: Self-pay | Admitting: Family Medicine

## 2016-04-02 VITALS — BP 136/62 | HR 64 | Temp 97.8°F | Ht 66.0 in | Wt 294.6 lb

## 2016-04-02 DIAGNOSIS — IMO0002 Reserved for concepts with insufficient information to code with codable children: Secondary | ICD-10-CM

## 2016-04-02 DIAGNOSIS — E1151 Type 2 diabetes mellitus with diabetic peripheral angiopathy without gangrene: Secondary | ICD-10-CM | POA: Diagnosis not present

## 2016-04-02 DIAGNOSIS — E1165 Type 2 diabetes mellitus with hyperglycemia: Secondary | ICD-10-CM

## 2016-04-02 DIAGNOSIS — R600 Localized edema: Secondary | ICD-10-CM

## 2016-04-02 DIAGNOSIS — E785 Hyperlipidemia, unspecified: Secondary | ICD-10-CM | POA: Diagnosis not present

## 2016-04-02 DIAGNOSIS — Z0000 Encounter for general adult medical examination without abnormal findings: Secondary | ICD-10-CM

## 2016-04-02 DIAGNOSIS — I10 Essential (primary) hypertension: Secondary | ICD-10-CM

## 2016-04-02 LAB — CBC WITH DIFFERENTIAL/PLATELET
BASOS ABS: 0 10*3/uL (ref 0.0–0.1)
Basophils Relative: 0.3 % (ref 0.0–3.0)
EOS ABS: 0.6 10*3/uL (ref 0.0–0.7)
Eosinophils Relative: 3.6 % (ref 0.0–5.0)
HCT: 45.6 % (ref 36.0–46.0)
Hemoglobin: 15.9 g/dL — ABNORMAL HIGH (ref 12.0–15.0)
LYMPHS ABS: 3.5 10*3/uL (ref 0.7–4.0)
Lymphocytes Relative: 22.7 % (ref 12.0–46.0)
MCHC: 34.8 g/dL (ref 30.0–36.0)
MCV: 96.1 fl (ref 78.0–100.0)
MONO ABS: 1.2 10*3/uL — AB (ref 0.1–1.0)
Monocytes Relative: 8.1 % (ref 3.0–12.0)
NEUTROS PCT: 65.3 % (ref 43.0–77.0)
Neutro Abs: 9.9 10*3/uL — ABNORMAL HIGH (ref 1.4–7.7)
Platelets: 312 10*3/uL (ref 150.0–400.0)
RBC: 4.75 Mil/uL (ref 3.87–5.11)
RDW: 16.7 % — ABNORMAL HIGH (ref 11.5–15.5)
WBC: 15.2 10*3/uL — ABNORMAL HIGH (ref 4.0–10.5)

## 2016-04-02 LAB — POCT URINALYSIS DIPSTICK
Bilirubin, UA: NEGATIVE
Blood, UA: NEGATIVE
Glucose, UA: NEGATIVE
KETONES UA: NEGATIVE
LEUKOCYTES UA: NEGATIVE
Nitrite, UA: NEGATIVE
PH UA: 6
PROTEIN UA: NEGATIVE
SPEC GRAV UA: 1.025
UROBILINOGEN UA: NEGATIVE

## 2016-04-02 LAB — COMPREHENSIVE METABOLIC PANEL
ALK PHOS: 80 U/L (ref 39–117)
ALT: 24 U/L (ref 0–35)
AST: 24 U/L (ref 0–37)
Albumin: 3.9 g/dL (ref 3.5–5.2)
BUN: 42 mg/dL — ABNORMAL HIGH (ref 6–23)
CO2: 22 mEq/L (ref 19–32)
Calcium: 9.6 mg/dL (ref 8.4–10.5)
Chloride: 99 mEq/L (ref 96–112)
Creatinine, Ser: 1.53 mg/dL — ABNORMAL HIGH (ref 0.40–1.20)
GFR: 34.77 mL/min — ABNORMAL LOW (ref >60.00)
GLUCOSE: 259 mg/dL — AB (ref 70–99)
POTASSIUM: 4.9 meq/L (ref 3.5–5.1)
SODIUM: 130 meq/L — AB (ref 135–145)
TOTAL PROTEIN: 7.4 g/dL (ref 6.0–8.3)
Total Bilirubin: 0.8 mg/dL (ref 0.2–1.2)

## 2016-04-02 LAB — LIPID PANEL
CHOL/HDL RATIO: 3
Cholesterol: 190 mg/dL (ref 0–200)
HDL: 62.9 mg/dL (ref >39.00)
LDL CALC: 99 mg/dL (ref 0–99)
NonHDL: 127.56
Triglycerides: 141 mg/dL (ref 0.0–149.0)
VLDL: 28.2 mg/dL (ref 0.0–40.0)

## 2016-04-02 LAB — MICROALBUMIN / CREATININE URINE RATIO
Creatinine,U: 49.6 mg/dL
MICROALB UR: 2.3 mg/dL — AB (ref 0.0–1.9)
Microalb Creat Ratio: 4.6 mg/g (ref 0.0–30.0)

## 2016-04-02 LAB — HEMOGLOBIN A1C: Hgb A1c MFr Bld: 9.4 % — ABNORMAL HIGH (ref 4.6–6.5)

## 2016-04-02 MED ORDER — GLIPIZIDE ER 5 MG PO TB24: 5.0000 mg | ORAL_TABLET | Freq: Every day | ORAL | Status: DC

## 2016-04-02 MED ORDER — FUROSEMIDE 20 MG PO TABS
20.0000 mg | ORAL_TABLET | Freq: Every day | ORAL | Status: DC
Start: 2016-04-02 — End: 2016-08-20

## 2016-04-02 MED ORDER — METOPROLOL TARTRATE 100 MG PO TABS: 100.0000 mg | ORAL_TABLET | Freq: Two times a day (BID) | ORAL | Status: DC

## 2016-04-02 MED ORDER — FUROSEMIDE 20 MG PO TABS: 20.0000 mg | ORAL_TABLET | Freq: Every day | ORAL | Status: DC

## 2016-04-02 NOTE — Patient Instructions (Signed)
Preventive Care for Adults, Female A healthy lifestyle and preventive care can promote health and wellness. Preventive health guidelines for women include the following key practices.  A routine yearly physical is a good way to check with your health care provider about your health and preventive screening. It is a chance to share any concerns and updates on your health and to receive a thorough exam.  Visit your dentist for a routine exam and preventive care every 6 months. Brush your teeth twice a day and floss once a day. Good oral hygiene prevents tooth decay and gum disease.  The frequency of eye exams is based on your age, health, family medical history, use of contact lenses, and other factors. Follow your health care provider's recommendations for frequency of eye exams.  Eat a healthy diet. Foods like vegetables, fruits, whole grains, low-fat dairy products, and lean protein foods contain the nutrients you need without too many calories. Decrease your intake of foods high in solid fats, added sugars, and salt. Eat the right amount of calories for you.Get information about a proper diet from your health care provider, if necessary.  Regular physical exercise is one of the most important things you can do for your health. Most adults should get at least 150 minutes of moderate-intensity exercise (any activity that increases your heart rate and causes you to sweat) each week. In addition, most adults need muscle-strengthening exercises on 2 or more days a week.  Maintain a healthy weight. The body mass index (BMI) is a screening tool to identify possible weight problems. It provides an estimate of body fat based on height and weight. Your health care provider can find your BMI and can help you achieve or maintain a healthy weight.For adults 20 years and older:  A BMI below 18.5 is considered underweight.  A BMI of 18.5 to 24.9 is normal.  A BMI of 25 to 29.9 is considered overweight.  A  BMI of 30 and above is considered obese.  Maintain normal blood lipids and cholesterol levels by exercising and minimizing your intake of saturated fat. Eat a balanced diet with plenty of fruit and vegetables. Blood tests for lipids and cholesterol should begin at age 45 and be repeated every 5 years. If your lipid or cholesterol levels are high, you are over 50, or you are at high risk for heart disease, you may need your cholesterol levels checked more frequently.Ongoing high lipid and cholesterol levels should be treated with medicines if diet and exercise are not working.  If you smoke, find out from your health care provider how to quit. If you do not use tobacco, do not start.  Lung cancer screening is recommended for adults aged 45-80 years who are at high risk for developing lung cancer because of a history of smoking. A yearly low-dose CT scan of the lungs is recommended for people who have at least a 30-pack-year history of smoking and are a current smoker or have quit within the past 15 years. A pack year of smoking is smoking an average of 1 pack of cigarettes a day for 1 year (for example: 1 pack a day for 30 years or 2 packs a day for 15 years). Yearly screening should continue until the smoker has stopped smoking for at least 15 years. Yearly screening should be stopped for people who develop a health problem that would prevent them from having lung cancer treatment.  If you are pregnant, do not drink alcohol. If you are  breastfeeding, be very cautious about drinking alcohol. If you are not pregnant and choose to drink alcohol, do not have more than 1 drink per day. One drink is considered to be 12 ounces (355 mL) of beer, 5 ounces (148 mL) of wine, or 1.5 ounces (44 mL) of liquor.  Avoid use of street drugs. Do not share needles with anyone. Ask for help if you need support or instructions about stopping the use of drugs.  High blood pressure causes heart disease and increases the risk  of stroke. Your blood pressure should be checked at least every 1 to 2 years. Ongoing high blood pressure should be treated with medicines if weight loss and exercise do not work.  If you are 55-79 years old, ask your health care provider if you should take aspirin to prevent strokes.  Diabetes screening is done by taking a blood sample to check your blood glucose level after you have not eaten for a certain period of time (fasting). If you are not overweight and you do not have risk factors for diabetes, you should be screened once every 3 years starting at age 45. If you are overweight or obese and you are 40-70 years of age, you should be screened for diabetes every year as part of your cardiovascular risk assessment.  Breast cancer screening is essential preventive care for women. You should practice "breast self-awareness." This means understanding the normal appearance and feel of your breasts and may include breast self-examination. Any changes detected, no matter how small, should be reported to a health care provider. Women in their 20s and 30s should have a clinical breast exam (CBE) by a health care provider as part of a regular health exam every 1 to 3 years. After age 40, women should have a CBE every year. Starting at age 40, women should consider having a mammogram (breast X-ray test) every year. Women who have a family history of breast cancer should talk to their health care provider about genetic screening. Women at a high risk of breast cancer should talk to their health care providers about having an MRI and a mammogram every year.  Breast cancer gene (BRCA)-related cancer risk assessment is recommended for women who have family members with BRCA-related cancers. BRCA-related cancers include breast, ovarian, tubal, and peritoneal cancers. Having family members with these cancers may be associated with an increased risk for harmful changes (mutations) in the breast cancer genes BRCA1 and  BRCA2. Results of the assessment will determine the need for genetic counseling and BRCA1 and BRCA2 testing.  Your health care provider may recommend that you be screened regularly for cancer of the pelvic organs (ovaries, uterus, and vagina). This screening involves a pelvic examination, including checking for microscopic changes to the surface of your cervix (Pap test). You may be encouraged to have this screening done every 3 years, beginning at age 21.  For women ages 30-65, health care providers may recommend pelvic exams and Pap testing every 3 years, or they may recommend the Pap and pelvic exam, combined with testing for human papilloma virus (HPV), every 5 years. Some types of HPV increase your risk of cervical cancer. Testing for HPV may also be done on women of any age with unclear Pap test results.  Other health care providers may not recommend any screening for nonpregnant women who are considered low risk for pelvic cancer and who do not have symptoms. Ask your health care provider if a screening pelvic exam is right for   you.  If you have had past treatment for cervical cancer or a condition that could lead to cancer, you need Pap tests and screening for cancer for at least 20 years after your treatment. If Pap tests have been discontinued, your risk factors (such as having a new sexual partner) need to be reassessed to determine if screening should resume. Some women have medical problems that increase the chance of getting cervical cancer. In these cases, your health care provider may recommend more frequent screening and Pap tests.  Colorectal cancer can be detected and often prevented. Most routine colorectal cancer screening begins at the age of 50 years and continues through age 75 years. However, your health care provider may recommend screening at an earlier age if you have risk factors for colon cancer. On a yearly basis, your health care provider may provide home test kits to check  for hidden blood in the stool. Use of a small camera at the end of a tube, to directly examine the colon (sigmoidoscopy or colonoscopy), can detect the earliest forms of colorectal cancer. Talk to your health care provider about this at age 50, when routine screening begins. Direct exam of the colon should be repeated every 5-10 years through age 75 years, unless early forms of precancerous polyps or small growths are found.  People who are at an increased risk for hepatitis B should be screened for this virus. You are considered at high risk for hepatitis B if:  You were born in a country where hepatitis B occurs often. Talk with your health care provider about which countries are considered high risk.  Your parents were born in a high-risk country and you have not received a shot to protect against hepatitis B (hepatitis B vaccine).  You have HIV or AIDS.  You use needles to inject street drugs.  You live with, or have sex with, someone who has hepatitis B.  You get hemodialysis treatment.  You take certain medicines for conditions like cancer, organ transplantation, and autoimmune conditions.  Hepatitis C blood testing is recommended for all people born from 1945 through 1965 and any individual with known risks for hepatitis C.  Practice safe sex. Use condoms and avoid high-risk sexual practices to reduce the spread of sexually transmitted infections (STIs). STIs include gonorrhea, chlamydia, syphilis, trichomonas, herpes, HPV, and human immunodeficiency virus (HIV). Herpes, HIV, and HPV are viral illnesses that have no cure. They can result in disability, cancer, and death.  You should be screened for sexually transmitted illnesses (STIs) including gonorrhea and chlamydia if:  You are sexually active and are younger than 24 years.  You are older than 24 years and your health care provider tells you that you are at risk for this type of infection.  Your sexual activity has changed  since you were last screened and you are at an increased risk for chlamydia or gonorrhea. Ask your health care provider if you are at risk.  If you are at risk of being infected with HIV, it is recommended that you take a prescription medicine daily to prevent HIV infection. This is called preexposure prophylaxis (PrEP). You are considered at risk if:  You are sexually active and do not regularly use condoms or know the HIV status of your partner(s).  You take drugs by injection.  You are sexually active with a partner who has HIV.  Talk with your health care provider about whether you are at high risk of being infected with HIV. If   you choose to begin PrEP, you should first be tested for HIV. You should then be tested every 3 months for as long as you are taking PrEP.  Osteoporosis is a disease in which the bones lose minerals and strength with aging. This can result in serious bone fractures or breaks. The risk of osteoporosis can be identified using a bone density scan. Women ages 67 years and over and women at risk for fractures or osteoporosis should discuss screening with their health care providers. Ask your health care provider whether you should take a calcium supplement or vitamin D to reduce the rate of osteoporosis.  Menopause can be associated with physical symptoms and risks. Hormone replacement therapy is available to decrease symptoms and risks. You should talk to your health care provider about whether hormone replacement therapy is right for you.  Use sunscreen. Apply sunscreen liberally and repeatedly throughout the day. You should seek shade when your shadow is shorter than you. Protect yourself by wearing long sleeves, pants, a wide-brimmed hat, and sunglasses year round, whenever you are outdoors.  Once a month, do a whole body skin exam, using a mirror to look at the skin on your back. Tell your health care provider of new moles, moles that have irregular borders, moles that  are larger than a pencil eraser, or moles that have changed in shape or color.  Stay current with required vaccines (immunizations).  Influenza vaccine. All adults should be immunized every year.  Tetanus, diphtheria, and acellular pertussis (Td, Tdap) vaccine. Pregnant women should receive 1 dose of Tdap vaccine during each pregnancy. The dose should be obtained regardless of the length of time since the last dose. Immunization is preferred during the 27th-36th week of gestation. An adult who has not previously received Tdap or who does not know her vaccine status should receive 1 dose of Tdap. This initial dose should be followed by tetanus and diphtheria toxoids (Td) booster doses every 10 years. Adults with an unknown or incomplete history of completing a 3-dose immunization series with Td-containing vaccines should begin or complete a primary immunization series including a Tdap dose. Adults should receive a Td booster every 10 years.  Varicella vaccine. An adult without evidence of immunity to varicella should receive 2 doses or a second dose if she has previously received 1 dose. Pregnant females who do not have evidence of immunity should receive the first dose after pregnancy. This first dose should be obtained before leaving the health care facility. The second dose should be obtained 4-8 weeks after the first dose.  Human papillomavirus (HPV) vaccine. Females aged 13-26 years who have not received the vaccine previously should obtain the 3-dose series. The vaccine is not recommended for use in pregnant females. However, pregnancy testing is not needed before receiving a dose. If a female is found to be pregnant after receiving a dose, no treatment is needed. In that case, the remaining doses should be delayed until after the pregnancy. Immunization is recommended for any person with an immunocompromised condition through the age of 61 years if she did not get any or all doses earlier. During the  3-dose series, the second dose should be obtained 4-8 weeks after the first dose. The third dose should be obtained 24 weeks after the first dose and 16 weeks after the second dose.  Zoster vaccine. One dose is recommended for adults aged 30 years or older unless certain conditions are present.  Measles, mumps, and rubella (MMR) vaccine. Adults born  before 1957 generally are considered immune to measles and mumps. Adults born in 1957 or later should have 1 or more doses of MMR vaccine unless there is a contraindication to the vaccine or there is laboratory evidence of immunity to each of the three diseases. A routine second dose of MMR vaccine should be obtained at least 28 days after the first dose for students attending postsecondary schools, health care workers, or international travelers. People who received inactivated measles vaccine or an unknown type of measles vaccine during 1963-1967 should receive 2 doses of MMR vaccine. People who received inactivated mumps vaccine or an unknown type of mumps vaccine before 1979 and are at high risk for mumps infection should consider immunization with 2 doses of MMR vaccine. For females of childbearing age, rubella immunity should be determined. If there is no evidence of immunity, females who are not pregnant should be vaccinated. If there is no evidence of immunity, females who are pregnant should delay immunization until after pregnancy. Unvaccinated health care workers born before 1957 who lack laboratory evidence of measles, mumps, or rubella immunity or laboratory confirmation of disease should consider measles and mumps immunization with 2 doses of MMR vaccine or rubella immunization with 1 dose of MMR vaccine.  Pneumococcal 13-valent conjugate (PCV13) vaccine. When indicated, a person who is uncertain of his immunization history and has no record of immunization should receive the PCV13 vaccine. All adults 65 years of age and older should receive this  vaccine. An adult aged 19 years or older who has certain medical conditions and has not been previously immunized should receive 1 dose of PCV13 vaccine. This PCV13 should be followed with a dose of pneumococcal polysaccharide (PPSV23) vaccine. Adults who are at high risk for pneumococcal disease should obtain the PPSV23 vaccine at least 8 weeks after the dose of PCV13 vaccine. Adults older than 80 years of age who have normal immune system function should obtain the PPSV23 vaccine dose at least 1 year after the dose of PCV13 vaccine.  Pneumococcal polysaccharide (PPSV23) vaccine. When PCV13 is also indicated, PCV13 should be obtained first. All adults aged 65 years and older should be immunized. An adult younger than age 65 years who has certain medical conditions should be immunized. Any person who resides in a nursing home or long-term care facility should be immunized. An adult smoker should be immunized. People with an immunocompromised condition and certain other conditions should receive both PCV13 and PPSV23 vaccines. People with human immunodeficiency virus (HIV) infection should be immunized as soon as possible after diagnosis. Immunization during chemotherapy or radiation therapy should be avoided. Routine use of PPSV23 vaccine is not recommended for American Indians, [place] Natives, or people younger than 65 years unless there are medical conditions that require PPSV23 vaccine. When indicated, people who have unknown immunization and have no record of immunization should receive PPSV23 vaccine. One-time revaccination 5 years after the first dose of PPSV23 is recommended for people aged 19-64 years who have chronic kidney failure, nephrotic syndrome, asplenia, or immunocompromised conditions. People who received 1-2 doses of PPSV23 before age 65 years should receive another dose of PPSV23 vaccine at age 65 years or later if at least 5 years have passed since the previous dose. Doses of PPSV23 are not  needed for people immunized with PPSV23 at or after age 65 years.  Meningococcal vaccine. Adults with asplenia or persistent complement component deficiencies should receive 2 doses of quadrivalent meningococcal conjugate (MenACWY-D) vaccine. The doses should be obtained   at least 2 months apart. Microbiologists working with certain meningococcal bacteria, [place] recruits, people at risk during an outbreak, and people who travel to or live in countries with a high rate of meningitis should be immunized. A first-year college student up through age 34 years who is living in a residence hall should receive a dose if she did not receive a dose on or after her 16th birthday. Adults who have certain high-risk conditions should receive one or more doses of vaccine.  Hepatitis A vaccine. Adults who wish to be protected from this disease, have certain high-risk conditions, work with hepatitis A-infected animals, work in hepatitis A research labs, or travel to or work in countries with a high rate of hepatitis A should be immunized. Adults who were previously unvaccinated and who anticipate close contact with an international adoptee during the first 60 days after arrival in the [place] from a country with a high rate of hepatitis A should be immunized.  Hepatitis B vaccine. Adults who wish to be protected from this disease, have certain high-risk conditions, may be exposed to blood or other infectious body fluids, are household contacts or sex partners of hepatitis B positive people, are clients or workers in certain care facilities, or travel to or work in countries with a high rate of hepatitis B should be immunized.  Haemophilus influenzae type b (Hib) vaccine. A previously unvaccinated person with asplenia or sickle cell disease or having a scheduled splenectomy should receive 1 dose of Hib vaccine. Regardless of previous immunization, a recipient of a hematopoietic stem cell transplant should receive a  3-dose series 6-12 months after her successful transplant. Hib vaccine is not recommended for adults with HIV infection. Preventive Services / Frequency Ages 35 to 4 years  Blood pressure check.** / Every 3-5 years.  Lipid and cholesterol check.** / Every 5 years beginning at age 60.  Clinical breast exam.** / Every 3 years for women in their 71s and 10s.  BRCA-related cancer risk assessment.** / For women who have family members with a BRCA-related cancer (breast, ovarian, tubal, or peritoneal cancers).  Pap test.** / Every 2 years from ages 76 through 26. Every 3 years starting at age 61 through age 76 or 93 with a history of 3 consecutive normal Pap tests.  HPV screening.** / Every 3 years from ages 37 through ages 60 to 51 with a history of 3 consecutive normal Pap tests.  Hepatitis C blood test.** / For any individual with known risks for hepatitis C.  Skin self-exam. / Monthly.  Influenza vaccine. / Every year.  Tetanus, diphtheria, and acellular pertussis (Tdap, Td) vaccine.** / Consult your health care provider. Pregnant women should receive 1 dose of Tdap vaccine during each pregnancy. 1 dose of Td every 10 years.  Varicella vaccine.** / Consult your health care provider. Pregnant females who do not have evidence of immunity should receive the first dose after pregnancy.  HPV vaccine. / 3 doses over 6 months, if 93 and younger. The vaccine is not recommended for use in pregnant females. However, pregnancy testing is not needed before receiving a dose.  Measles, mumps, rubella (MMR) vaccine.** / You need at least 1 dose of MMR if you were born in 1957 or later. You may also need a 2nd dose. For females of childbearing age, rubella immunity should be determined. If there is no evidence of immunity, females who are not pregnant should be vaccinated. If there is no evidence of immunity, females who are  pregnant should delay immunization until after pregnancy.  Pneumococcal  13-valent conjugate (PCV13) vaccine.** / Consult your health care provider.  Pneumococcal polysaccharide (PPSV23) vaccine.** / 1 to 2 doses if you smoke cigarettes or if you have certain conditions.  Meningococcal vaccine.** / 1 dose if you are age 68 to 8 years and a Market researcher living in a residence hall, or have one of several medical conditions, you need to get vaccinated against meningococcal disease. You may also need additional booster doses.  Hepatitis A vaccine.** / Consult your health care provider.  Hepatitis B vaccine.** / Consult your health care provider.  Haemophilus influenzae type b (Hib) vaccine.** / Consult your health care provider. Ages 7 to 53 years  Blood pressure check.** / Every year.  Lipid and cholesterol check.** / Every 5 years beginning at age 25 years.  Lung cancer screening. / Every year if you are aged 11-80 years and have a 30-pack-year history of smoking and currently smoke or have quit within the past 15 years. Yearly screening is stopped once you have quit smoking for at least 15 years or develop a health problem that would prevent you from having lung cancer treatment.  Clinical breast exam.** / Every year after age 48 years.  BRCA-related cancer risk assessment.** / For women who have family members with a BRCA-related cancer (breast, ovarian, tubal, or peritoneal cancers).  Mammogram.** / Every year beginning at age 41 years and continuing for as long as you are in good health. Consult with your health care provider.  Pap test.** / Every 3 years starting at age 65 years through age 37 or 70 years with a history of 3 consecutive normal Pap tests.  HPV screening.** / Every 3 years from ages 72 years through ages 60 to 40 years with a history of 3 consecutive normal Pap tests.  Fecal occult blood test (FOBT) of stool. / Every year beginning at age 21 years and continuing until age 5 years. You may not need to do this test if you get  a colonoscopy every 10 years.  Flexible sigmoidoscopy or colonoscopy.** / Every 5 years for a flexible sigmoidoscopy or every 10 years for a colonoscopy beginning at age 35 years and continuing until age 48 years.  Hepatitis C blood test.** / For all people born from [birth year] through 1965 and any individual with known risks for hepatitis C.  Skin self-exam. / Monthly.  Influenza vaccine. / Every year.  Tetanus, diphtheria, and acellular pertussis (Tdap/Td) vaccine.** / Consult your health care provider. Pregnant women should receive 1 dose of Tdap vaccine during each pregnancy. 1 dose of Td every 10 years.  Varicella vaccine.** / Consult your health care provider. Pregnant females who do not have evidence of immunity should receive the first dose after pregnancy.  Zoster vaccine.** / 1 dose for adults aged 30 years or older.  Measles, mumps, rubella (MMR) vaccine.** / You need at least 1 dose of MMR if you were born in 1957 or later. You may also need a second dose. For females of childbearing age, rubella immunity should be determined. If there is no evidence of immunity, females who are not pregnant should be vaccinated. If there is no evidence of immunity, females who are pregnant should delay immunization until after pregnancy.  Pneumococcal 13-valent conjugate (PCV13) vaccine.** / Consult your health care provider.  Pneumococcal polysaccharide (PPSV23) vaccine.** / 1 to 2 doses if you smoke cigarettes or if you have certain conditions.  Meningococcal vaccine.** /  Consult your health care provider.  Hepatitis A vaccine.** / Consult your health care provider.  Hepatitis B vaccine.** / Consult your health care provider.  Haemophilus influenzae type b (Hib) vaccine.** / Consult your health care provider. Ages 64 years and over  Blood pressure check.** / Every year.  Lipid and cholesterol check.** / Every 5 years beginning at age 23 years.  Lung cancer screening. / Every year if you  are aged 16-80 years and have a 30-pack-year history of smoking and currently smoke or have quit within the past 15 years. Yearly screening is stopped once you have quit smoking for at least 15 years or develop a health problem that would prevent you from having lung cancer treatment.  Clinical breast exam.** / Every year after age 74 years.  BRCA-related cancer risk assessment.** / For women who have family members with a BRCA-related cancer (breast, ovarian, tubal, or peritoneal cancers).  Mammogram.** / Every year beginning at age 44 years and continuing for as long as you are in good health. Consult with your health care provider.  Pap test.** / Every 3 years starting at age 58 years through age 22 or 39 years with 3 consecutive normal Pap tests. Testing can be stopped between 65 and 70 years with 3 consecutive normal Pap tests and no abnormal Pap or HPV tests in the past 10 years.  HPV screening.** / Every 3 years from ages 64 years through ages 70 or 61 years with a history of 3 consecutive normal Pap tests. Testing can be stopped between 65 and 70 years with 3 consecutive normal Pap tests and no abnormal Pap or HPV tests in the past 10 years.  Fecal occult blood test (FOBT) of stool. / Every year beginning at age 40 years and continuing until age 27 years. You may not need to do this test if you get a colonoscopy every 10 years.  Flexible sigmoidoscopy or colonoscopy.** / Every 5 years for a flexible sigmoidoscopy or every 10 years for a colonoscopy beginning at age 7 years and continuing until age 32 years.  Hepatitis C blood test.** / For all people born from [birth year] through 1963 and any individual with known risks for hepatitis C.  Osteoporosis screening.** / A one-time screening for women ages 30 years and over and women at risk for fractures or osteoporosis.  Skin self-exam. / Monthly.  Influenza vaccine. / Every year.  Tetanus, diphtheria, and acellular pertussis (Tdap/Td)  vaccine.** / 1 dose of Td every 10 years.  Varicella vaccine.** / Consult your health care provider.  Zoster vaccine.** / 1 dose for adults aged 35 years or older.  Pneumococcal 13-valent conjugate (PCV13) vaccine.** / Consult your health care provider.  Pneumococcal polysaccharide (PPSV23) vaccine.** / 1 dose for all adults aged 46 years and older.  Meningococcal vaccine.** / Consult your health care provider.  Hepatitis A vaccine.** / Consult your health care provider.  Hepatitis B vaccine.** / Consult your health care provider.  Haemophilus influenzae type b (Hib) vaccine.** / Consult your health care provider. ** Family history and personal history of risk and conditions may change your health care provider's recommendations.   This information is not intended to replace advice given to you by your health care provider. Make sure you discuss any questions you have with your health care provider.   Document Released: 02/12/2002 Document Revised: 01/07/2015 Document Reviewed: 05/14/2011 Elsevier Interactive Patient Education [employer].

## 2016-04-02 NOTE — Progress Notes (Signed)
Pre visit review using our clinic review tool, if applicable. No additional management support is needed unless otherwise documented below in the visit note. 

## 2016-04-02 NOTE — Progress Notes (Signed)
Subjective:   Katelyn Mahoney is a 82 y.o. female who presents for Medicare Annual (Subsequent) preventive examination.   HPI HYPERTENSION  Blood pressure range-not checking  Chest pain- no      Dyspnea- no Lightheadedness- no   Edema- no Other side effects - no   Medication compliance: good Low salt diet- yes  DIABETES  Blood Sugar ranges-high--250s  Polyuria- no New Visual problems- no Hypoglycemic symptoms- no Other side effects-no Medication compliance - good Last eye exam- regularly Foot exam- podiatry  HYPERLIPIDEMIA  Medication compliance- good RUQ pain- no  Muscle aches- no Other side effects-no    Review of Systems:   Review of Systems  Constitutional: Negative for activity change, appetite change and fatigue.  HENT: Negative for hearing loss, congestion, tinnitus and ear discharge.   Eyes: Negative for visual disturbance (see optho q1y -- vision corrected to 20/20 with glasses).  Respiratory: Negative for cough, chest tightness and shortness of breath.   Cardiovascular: Negative for chest pain, palpitations and leg swelling.  Gastrointestinal: Negative for abdominal pain, diarrhea, constipation and abdominal distention.  Genitourinary: Negative for urgency, frequency, decreased urine volume and difficulty urinating.  Musculoskeletal: Negative for back pain, arthralgias and gait problem.  Skin: Negative for color change, pallor and rash.  Neurological: Negative for dizziness, light-headedness, numbness and headaches.  Hematological: Negative for adenopathy. Does not bruise/bleed easily.  Psychiatric/Behavioral: Negative for suicidal ideas, confusion, sleep disturbance, self-injury, dysphoric mood, decreased concentration and agitation.  Pt is able to read and write and can do all ADLs No risk for falling No abuse/ violence in home           Objective:     Vitals: BP 136/62 mmHg  Pulse 64  Temp(Src) 97.8 F (36.6 C) (Oral)  Ht 5\' 6"  (1.676  m)  Wt 294 lb 9.6 oz (133.63 kg)  BMI 47.57 kg/m2  SpO2 97%  Body mass index is 47.57 kg/(m^2). BP 136/62 mmHg  Pulse 64  Temp(Src) 97.8 F (36.6 C) (Oral)  Ht 5\' 6"  (1.676 m)  Wt 294 lb 9.6 oz (133.63 kg)  BMI 47.57 kg/m2  SpO2 97% General appearance: alert, cooperative, appears stated age and no distress Head: Normocephalic, without obvious abnormality, atraumatic Eyes: conjunctivae/corneas clear. PERRL, EOM's intact. Fundi benign. Ears: normal TM's and external ear canals both ears Nose: Nares normal. Septum midline. Mucosa normal. No drainage or sinus tenderness. Throat: lips, mucosa, and tongue normal; teeth and gums normal Neck: no adenopathy, no carotid bruit, no JVD, supple, symmetrical, trachea midline and thyroid not enlarged, symmetric, no tenderness/mass/nodules Back: symmetric, no curvature. ROM normal. No CVA tenderness. Lungs: clear to auscultation bilaterally Breasts: normal appearance, no masses or tenderness Heart: regular rate and rhythm, S1, S2 normal, no murmur, click, rub or gallop Abdomen: soft, non-tender; bowel sounds normal; no masses,  no organomegaly Pelvic: not indicated; status post hysterectomy, negative ROS Extremities: extremities normal, atraumatic, no cyanosis or edema Pulses: 2+ and symmetric Skin: Skin color, texture, turgor normal. No rashes or lesions Lymph nodes: Cervical, supraclavicular, and axillary nodes normal. Neurologic: Alert and oriented X 3, normal strength and tone. Normal symmetric reflexes. Normal coordination and gait psychh- no depression  Tobacco History  Smoking status  . Never Smoker   Smokeless tobacco  . Never Used     Counseling given: Not Answered   Past Medical History  Diagnosis Date  . Allergy   . Arthritis   . Diabetes mellitus without complication ([place])   . Hypertension   .  Callus   . Depression   . Arthritis   . Anxiety   . Hyperlipidemia    Past Surgical History  Procedure Laterality Date  .  Cholecystectomy    . Abdominal hysterectomy    . Splenectomy     Family History  Problem Relation Age of Onset  . Cancer Mother   . Cancer Father   . Cancer Sister   . Cancer Brother    History  Sexual Activity  . Sexual Activity: No    Outpatient Encounter Prescriptions as of 04/02/2016  Medication Sig  . acetaminophen (TYLENOL ARTHRITIS PAIN) 650 MG CR tablet 2 by mouth once daily as needed  . aspirin 81 MG tablet Take 81 mg by mouth. 3 by mouth in the evening  . atorvastatin (LIPITOR) 20 MG tablet TAKE 1/2 TABLET BY MOUTH MONDAY, [place]  . Bilberry, Vaccinium myrtillus, (BILBERRY PO) Take 470 mg by mouth. 2 by mouth daily  . FLECTOR 1.3 % PTCH APPLY 1 PATCH ONTO THE SKIN TWICE A DAY (Patient taking differently: APPLY 1 PATCH ONTO THE SKIN TWICE A DAY PRN)  . glipiZIDE (GLUCOTROL XL) 5 MG 24 hr tablet Take 1 tablet (5 mg total) by mouth daily with breakfast.  . Glucosamine HCl (GLUCOSAMINE PO) Take by mouth daily.  Marland Kitchen glucose blood (ONE TOUCH TEST STRIPS) test strip 1 each by Other route daily. E11.21 Ultra blue  . JANUVIA 50 MG tablet TAKE 1 TABLET DAILY  . Loratadine (CLARITIN) 10 MG CAPS Take 1 capsule by mouth daily as needed.   Marland Kitchen losartan (COZAAR) 100 MG tablet Take 1 tablet (100 mg total) by mouth daily.  . metoprolol (LOPRESSOR) 100 MG tablet Take 1 tablet (100 mg total) by mouth 2 (two) times daily.  . Multiple Vitamins-Minerals (HAIR/SKIN/NAILS) TABS Take 1 tablet by mouth daily.  . multivitamin-lutein (OCUVITE-LUTEIN) CAPS capsule Take 1 capsule by mouth daily.  Marland Kitchen nystatin ointment (MYCOSTATIN) Apply 1 application topically 2 (two) times daily. (Patient taking differently: Apply 1 application topically as needed. )  . ONETOUCH DELICA LANCETS 99991111 MISC Use to test blood sugar once daily- E11.21  . sertraline (ZOLOFT) 100 MG tablet Take 1 tablet (100 mg total) by mouth daily.  . [DISCONTINUED] glipiZIDE (GLUCOTROL XL) 5 MG 24 hr tablet Take 1 tablet (5  mg total) by mouth daily with breakfast.  . [DISCONTINUED] metoprolol (LOPRESSOR) 100 MG tablet Take 1 tablet (100 mg total) by mouth 2 (two) times daily.  . [DISCONTINUED] HYDROcodone-acetaminophen (NORCO/VICODIN) 5-325 MG tablet Take 1 tablet by mouth every 6 (six) hours as needed for moderate pain. (Patient not taking: Reported on 03/30/2016)  . [DISCONTINUED] Multiple Vitamins-Minerals (CENTRUM SILVER ULTRA WOMENS) TABS Take 1 tablet by mouth daily. (Patient not taking: Reported on 03/30/2016)   No facility-administered encounter medications on file as of 04/02/2016.    Activities of Daily Living In your present state of health, do you have any difficulty performing the following activities: 04/02/2016 10/14/2015  Hearing? N N  Vision? N N  Difficulty concentrating or making decisions? N N  Walking or climbing stairs? N N  Dressing or bathing? N N  Doing errands, shopping? N N    Patient Care Team: Midge Minium, MD as PCP - General (Family Medicine) Rutherford Guys, MD as Consulting Physician (Ophthalmology) Rod Can, MD as Consulting Physician (Orthopedic Surgery) Gardiner Barefoot, DPM as Consulting Physician (Podiatry) Provider Not In System as Consulting Physician (Dentistry)    Assessment:    cpe Exercise  Activities and Dietary recommendations Current Exercise Habits: Home exercise routine, Type of exercise: walking, Time (Minutes): 15, Frequency (Times/Week): 7, Weekly Exercise (Minutes/Week): 105, Intensity: Mild, Exercise limited by: orthopedic condition(s)  Goals    None     Fall Risk Fall Risk  04/02/2016 10/14/2015 03/03/2015 04/07/2014  Falls in the past year? No Yes No No  Number falls in past yr: - 1 - -  Injury with Fall? - No - -  Risk for fall due to : - Other (Comment) - -  Risk for fall due to (comments): - pt fell out of the car this past spring - -   Depression Screen PHQ 2/9 Scores 04/02/2016 10/14/2015 10/04/2015 03/03/2015  PHQ - 2 Score 0 0 0 0      Cognitive Testing mmse 30/30  Immunization History  Administered Date(s) Administered  . Influenza Split 10/03/2011, 09/30/2012  . Influenza Whole 09/24/2008, 09/27/2009, 09/15/2010  . Influenza,inj,Quad PF,36+ Mos 09/24/2013, 09/09/2014, 09/20/2015  . Pneumococcal Conjugate-13 11/09/2014  . Pneumococcal Polysaccharide-23 01/17/2012  . Td 04/07/2014  . Zoster 03/22/2008   Screening Tests Health Maintenance  Topic Date Due  . HEMOGLOBIN A1C  05/07/2016  . OPHTHALMOLOGY EXAM  05/31/2016  . MAMMOGRAM  07/04/2016  . INFLUENZA VACCINE  07/31/2016  . FOOT EXAM  02/16/2017  . TETANUS/TDAP  04/07/2024  . DEXA SCAN  Completed  . ZOSTAVAX  Completed  . PNA vac Low Risk Adult  Completed      Plan:    see AVS During the course of the visit the patient was educated and counseled about the following appropriate screening and preventive services:   Vaccines to include Pneumoccal, Influenza, Hepatitis B, Td, Zostavax, HCV  Electrocardiogram  Cardiovascular Disease  Colorectal cancer screening  Bone density screening  Diabetes screening  Glaucoma screening  Mammography/PAP  Nutrition counseling   Patient Instructions (the written plan) was given to the patient.  1. Essential hypertension Stable - metoprolol (LOPRESSOR) 100 MG tablet; Take 1 tablet (100 mg total) by mouth 2 (two) times daily.  Dispense: 180 tablet; Refill: 1  2. DM (diabetes mellitus) type II uncontrolled, periph vascular disorder (HCC) con't januvia Check labs Uncontrolled per pt - glipiZIDE (GLUCOTROL XL) 5 MG 24 hr tablet; Take 1 tablet (5 mg total) by mouth daily with breakfast.  Dispense: 90 tablet; Refill: [place], DO  04/02/2016

## 2016-04-05 ENCOUNTER — Encounter: Payer: Self-pay | Admitting: Family Medicine

## 2016-04-07 ENCOUNTER — Other Ambulatory Visit: Payer: Self-pay | Admitting: Family Medicine

## 2016-04-07 DIAGNOSIS — R7989 Other specified abnormal findings of blood chemistry: Secondary | ICD-10-CM

## 2016-04-07 DIAGNOSIS — D72829 Elevated white blood cell count, unspecified: Secondary | ICD-10-CM

## 2016-04-09 ENCOUNTER — Encounter: Payer: Self-pay | Admitting: Family Medicine

## 2016-04-10 MED ORDER — SITAGLIPTIN PHOSPHATE 50 MG PO TABS: 50.0000 mg | ORAL_TABLET | Freq: Every day | ORAL | Status: DC

## 2016-04-10 MED ORDER — ATORVASTATIN CALCIUM 20 MG PO TABS: ORAL_TABLET | ORAL | Status: DC

## 2016-04-10 MED ORDER — SERTRALINE HCL 100 MG PO TABS: 100.0000 mg | ORAL_TABLET | Freq: Every day | ORAL | Status: DC

## 2016-04-10 NOTE — Telephone Encounter (Signed)
Refilled patients [facility]

## 2016-05-03 ENCOUNTER — Ambulatory Visit: Payer: Medicare Other

## 2016-05-03 ENCOUNTER — Encounter: Payer: Self-pay | Admitting: Hematology & Oncology

## 2016-05-03 ENCOUNTER — Ambulatory Visit (HOSPITAL_BASED_OUTPATIENT_CLINIC_OR_DEPARTMENT_OTHER): Payer: Medicare Other | Admitting: Hematology & Oncology

## 2016-05-03 ENCOUNTER — Other Ambulatory Visit (HOSPITAL_BASED_OUTPATIENT_CLINIC_OR_DEPARTMENT_OTHER): Payer: Medicare Other

## 2016-05-03 VITALS — BP 122/65 | HR 84 | Temp 97.6°F | Resp 16 | Ht 66.0 in | Wt 294.0 lb

## 2016-05-03 DIAGNOSIS — D72829 Elevated white blood cell count, unspecified: Secondary | ICD-10-CM

## 2016-05-03 DIAGNOSIS — D580 Hereditary spherocytosis: Secondary | ICD-10-CM

## 2016-05-03 LAB — CBC WITH DIFFERENTIAL (CANCER CENTER ONLY)
BASO#: 0.1 10*3/uL (ref 0.0–0.2)
BASO%: 0.4 % (ref 0.0–2.0)
EOS ABS: 0.5 10*3/uL (ref 0.0–0.5)
EOS%: 3.2 % (ref 0.0–7.0)
HCT: 43.2 % (ref 34.8–46.6)
HGB: 15.5 g/dL (ref 11.6–15.9)
LYMPH#: 2.8 10*3/uL (ref 0.9–3.3)
LYMPH%: 19.9 % (ref 14.0–48.0)
MCH: 34.1 pg — AB (ref 26.0–34.0)
MCHC: 35.9 g/dL (ref 32.0–36.0)
MCV: 95 fL (ref 81–101)
MONO#: 1.5 10*3/uL — AB (ref 0.1–0.9)
MONO%: 10.4 % (ref 0.0–13.0)
NEUT#: 9.4 10*3/uL — ABNORMAL HIGH (ref 1.5–6.5)
NEUT%: 66.1 % (ref 39.6–80.0)
PLATELETS: 322 10*3/uL (ref 145–400)
RBC: 4.54 10*6/uL (ref 3.70–5.32)
RDW: 15.6 % (ref 11.1–15.7)
WBC: 14.3 10*3/uL — AB (ref 3.9–10.0)

## 2016-05-03 LAB — CHCC SATELLITE - SMEAR

## 2016-05-03 NOTE — Progress Notes (Signed)
Referral MD  Reason for Referral: Chronic leukocytosis-status post splenectomy for hereditary spherocytosis   Chief Complaint  Patient presents with  . Other    New Patient  : White blood cell count is high.  HPI: Katelyn Mahoney is a very nice 82 year old white female. She has a long history of diabetes. Even more interesting is the fact that she has hereditary spherocytosis. She had her spleen out when she was in her 22s. She says that a lot of her family members have had spherocytosis. She has not been on folic acid.  She has a mildly elevated white cell count. Go back to the record, she has had this for several years. Her white cell count has not gone up.  She's had no problem with infections. She's had no change in bowel or bladder habits. She's had no rashes. She does have a lesion on the left side of her face at the angle of the jaw. She will go to a dermatologist for this. She does get her mammograms yearly.  She does not smoke. She does not drink.  She has had no change in her medications.  She has had no problems with mouth sores. She's had no visual issues. Overall, she's done very well with her diabetes. I think her last hemoglobin A1c was 7.9.  She was kindly referred to the [facility] for evaluation of the leukocytosis.  Overall, her performance status is ECOG 1.     Past Medical History  Diagnosis Date  . Allergy   . Arthritis   . Diabetes mellitus without complication ([place])   . Hypertension   . Callus   . Depression   . Arthritis   . Anxiety   . Hyperlipidemia   :  Past Surgical History  Procedure Laterality Date  . Cholecystectomy    . Abdominal hysterectomy    . Splenectomy    :   Current outpatient prescriptions:  .  acetaminophen (TYLENOL ARTHRITIS PAIN) 650 MG CR tablet, 2 by mouth once daily as needed, Disp: , Rfl:  .  aspirin 81 MG tablet, Take 81 mg by mouth. 3 by mouth in the evening, Disp: , Rfl:  .  atorvastatin (LIPITOR)  20 MG tablet, TAKE 1/2 TABLET BY MOUTH MONDAY, WEDNESDAY & FRIDAY AND SUNDAY, Disp: 26 tablet, Rfl: 3 .  Bilberry, Vaccinium myrtillus, (BILBERRY PO), Take 470 mg by mouth. 2 by mouth daily, Disp: , Rfl:  .  FLECTOR 1.3 % PTCH, APPLY 1 PATCH ONTO THE SKIN TWICE A DAY (Patient taking differently: APPLY 1 PATCH ONTO THE SKIN TWICE A DAY PRN), Disp: 30 patch, Rfl: 2 .  furosemide (LASIX) 20 MG tablet, Take 1 tablet (20 mg total) by mouth daily., Disp: 30 tablet, Rfl: 0 .  glipiZIDE (GLUCOTROL XL) 5 MG 24 hr tablet, Take 1 tablet (5 mg total) by mouth daily with breakfast., Disp: 90 tablet, Rfl: 1 .  Glucosamine HCl (GLUCOSAMINE PO), Take by mouth daily., Disp: , Rfl:  .  glucose blood (ONE TOUCH TEST STRIPS) test strip, 1 each by Other route daily. E11.21 Ultra blue, Disp: 100 each, Rfl: 3 .  Loratadine (CLARITIN) 10 MG CAPS, Take 1 capsule by mouth daily as needed. , Disp: , Rfl:  .  losartan (COZAAR) 100 MG tablet, Take 1 tablet (100 mg total) by mouth daily., Disp: 90 tablet, Rfl: 3 .  metoprolol (LOPRESSOR) 100 MG tablet, Take 1 tablet (100 mg total) by mouth 2 (two) times daily., Disp: 180 tablet,  Rfl: 1 .  Multiple Vitamins-Minerals (HAIR/SKIN/NAILS) TABS, Take 1 tablet by mouth daily., Disp: 90 each, Rfl: 3 .  multivitamin-lutein (OCUVITE-LUTEIN) CAPS capsule, Take 1 capsule by mouth daily., Disp: 90 capsule, Rfl: 3 .  nystatin ointment (MYCOSTATIN), Apply 1 application topically 2 (two) times daily. (Patient taking differently: Apply 1 application topically as needed. ), Disp: 30 g, Rfl: 6 .  ONETOUCH DELICA LANCETS 73S MISC, Use to test blood sugar once daily- E11.21, Disp: 100 each, Rfl: 3 .  sertraline (ZOLOFT) 100 MG tablet, Take 1 tablet (100 mg total) by mouth daily., Disp: 90 tablet, Rfl: 1 .  sitaGLIPtin (JANUVIA) 50 MG tablet, Take 1 tablet (50 mg total) by mouth daily., Disp: 90 tablet, Rfl: 0:  :  Allergies  Allergen Reactions  . Bupropion Hcl     Palmar rash  . Penicillins      rash  . Amlodipine Besylate     Cough   . Citalopram Hydrobromide Other (See Comments)    Unknown  :  Family History  Problem Relation Age of Onset  . Cancer Mother   . Cancer Father   . Cancer Sister   . Cancer Brother   :  Social History   Social History  . Marital Status: Widowed    Spouse Name: N/A  . Number of Children: N/A  . Years of Education: N/A   Occupational History  . Not on file.   Social History Main Topics  . Smoking status: Never Smoker   . Smokeless tobacco: Never Used  . Alcohol Use: No  . Drug Use: No  . Sexual Activity: No   Other Topics Concern  . Not on file   Social History Narrative  :  Pertinent items are noted in HPI.  Exam: _0 @ Obese white female in no obvious distress. Her vital signs show a temperature of 97.6. Pulse 84. Blood pressure 122/65. Weight is 294 pounds. Hent exam shows no ocular or oral lesions. She has no palpable cervical or supra clavicular lymph nodes. Lungs are clear bilaterally. Cardiac exam regular rate and rhythm with no murmurs, rubs or bruits. Abdomen is obese but soft. She has good bowel sounds. There is no fluid wave. She has a well-healed splenectomy scar. There is no palpable hepatomegaly. Back exam shows no tenderness over the spine, ribs or hips. Extremities shows chronic nonpitting edema of her lower legs. Skin exam shows no rashes, ecchymoses or petechia. Neurological exam shows no focal neurological deficits.    Recent Labs  05/03/16 1027  WBC 14.3*  HGB 15.5  HCT 43.2  PLT 322   No results for input(s): NA, K, CL, CO2, GLUCOSE, BUN, CREATININE, CALCIUM in the last 72 hours.  Blood smear review:  Normochromic and normocytic population of red blood cells. She has no teardrop cells. There are no nuclear red blood cells. She has a few spherocytes. There are no schistocytes. I see no target cells. White blood cells been normal in morphology and maturation. There are no hypersegmented polys. She  has no immature myeloid lymphoid forms. She has no atypical lymphocytes. Platelets are adequate in number and size.  Pathology: None     Assessment and Plan:  Katelyn Mahoney is a very charming 82 year old white female with long-standing diabetes. She has hereditary spherocytosis. She's had her spleen taken out.  I have to believe that the leukocytosis is reactive with her having the spleen taken out. I'm little surprise of the bladder Was not higher. However, she is on  quite a few medications.  Her blood smear does not show me any evidence of a hematologic malignancy. I do not see any evidence of a bone marrow disorder. I do not see anything that suggest a myeloproliferative condition. As such, we do not have to send off JAK2 or other genetic assays.  Katelyn Mahoney, herself, was not worried about the leukocytosis. She told me that she has known about her high white cells for many years.  At this point, she does not need a bone marrow biopsy.  She does not need any radiologic studies.  I really do not believe that we have to get her back to the office. I'm not sure that we are adding to her medical care.  I spent about 45 minutes with her. She is very nice. It was wonderful talking with her. She does have a strong faith.

## 2016-05-10 ENCOUNTER — Encounter: Payer: Self-pay | Admitting: Podiatry

## 2016-05-10 ENCOUNTER — Ambulatory Visit (INDEPENDENT_AMBULATORY_CARE_PROVIDER_SITE_OTHER): Payer: Medicare Other | Admitting: Podiatry

## 2016-05-10 DIAGNOSIS — B351 Tinea unguium: Secondary | ICD-10-CM

## 2016-05-10 DIAGNOSIS — M79676 Pain in unspecified toe(s): Secondary | ICD-10-CM

## 2016-05-10 DIAGNOSIS — E1151 Type 2 diabetes mellitus with diabetic peripheral angiopathy without gangrene: Secondary | ICD-10-CM

## 2016-05-10 NOTE — Progress Notes (Signed)
Patient ID: Katelyn Mahoney, female   DOB: 08-28-1934, 82 y.o.   MRN: [id number] Complaint:  Visit Type: Patient returns to my office for continued preventative foot care services. Complaint: Patient states" my nails have grown long and thick and become painful to walk and wear shoes" Patient has been diagnosed with DM with no complications. He presents for preventative foot care services. No changes to ROS  Podiatric Exam: Vascular: dorsalis pedis and posterior tibial pulses are palpable bilateral. Capillary return is immediate. Temperature gradient is WNL. Skin turgor WNL  Sensorium: Normal Semmes Weinstein monofilament test. Normal tactile sensation bilaterally. Nail Exam: Pt has thick disfigured discolored nails with subungual debris noted bilateral entire nail hallux through fifth toenails Ulcer Exam: There is no evidence of ulcer or pre-ulcerative changes or infection. Orthopedic Exam: Muscle tone and strength are WNL. No limitations in general ROM. No crepitus or effusions noted. Foot type and digits show no abnormalities. Bony prominences are unremarkable. Skin: No Porokeratosis. No infection or ulcers  Diagnosis:  Tinea unguium, Pain in right toe, pain in left toes  Treatment & Plan Procedures and Treatment: Consent by patient was obtained for treatment procedures. The patient understood the discussion of treatment and procedures well. All questions were answered thoroughly reviewed. Debridement of mycotic and hypertrophic toenails, 1 through 5 bilateral and clearing of subungual debris. No ulceration, no infection noted.  Return Visit-Office Procedure: Patient instructed to return to the office for a follow up visit 3 months for continued evaluation and treatment.  Gardiner Barefoot DPM

## 2016-06-06 ENCOUNTER — Encounter: Payer: Self-pay | Admitting: Family Medicine

## 2016-06-06 DIAGNOSIS — I10 Essential (primary) hypertension: Secondary | ICD-10-CM

## 2016-06-06 DIAGNOSIS — E1151 Type 2 diabetes mellitus with diabetic peripheral angiopathy without gangrene: Secondary | ICD-10-CM

## 2016-06-06 DIAGNOSIS — IMO0002 Reserved for concepts with insufficient information to code with codable children: Secondary | ICD-10-CM

## 2016-06-06 DIAGNOSIS — E1165 Type 2 diabetes mellitus with hyperglycemia: Secondary | ICD-10-CM

## 2016-06-06 MED ORDER — LOSARTAN POTASSIUM 100 MG PO TABS: 100.0000 mg | ORAL_TABLET | Freq: Every day | ORAL | Status: DC

## 2016-06-06 NOTE — Telephone Encounter (Signed)
Losartan has been faxed. Please advise on the above concerns,   KP

## 2016-06-06 NOTE — Telephone Encounter (Signed)
Inc glucotrol to 10 mg daily Recheck labs in 3 months

## 2016-06-07 MED ORDER — GLIPIZIDE ER 10 MG PO TB24: 10.0000 mg | ORAL_TABLET | Freq: Every day | ORAL | Status: DC

## 2016-06-22 ENCOUNTER — Encounter: Payer: Self-pay | Admitting: Family Medicine

## 2016-06-22 MED ORDER — SITAGLIPTIN PHOSPHATE 50 MG PO TABS: 50.0000 mg | ORAL_TABLET | Freq: Every day | ORAL | Status: DC

## 2016-07-09 ENCOUNTER — Encounter: Payer: Self-pay | Admitting: Family Medicine

## 2016-07-09 MED ORDER — GLUCOSE BLOOD VI STRP: ORAL_STRIP | Status: DC

## 2016-07-31 ENCOUNTER — Other Ambulatory Visit: Payer: Self-pay | Admitting: Family Medicine

## 2016-08-01 ENCOUNTER — Encounter: Payer: Self-pay | Admitting: Family Medicine

## 2016-08-02 ENCOUNTER — Ambulatory Visit (INDEPENDENT_AMBULATORY_CARE_PROVIDER_SITE_OTHER): Payer: Medicare Other | Admitting: Family Medicine

## 2016-08-02 ENCOUNTER — Encounter: Payer: Self-pay | Admitting: Family Medicine

## 2016-08-02 VITALS — BP 126/68 | HR 85 | Temp 98.0°F | Resp 95 | Wt 291.2 lb

## 2016-08-02 DIAGNOSIS — L500 Allergic urticaria: Secondary | ICD-10-CM | POA: Diagnosis not present

## 2016-08-02 DIAGNOSIS — E1151 Type 2 diabetes mellitus with diabetic peripheral angiopathy without gangrene: Secondary | ICD-10-CM

## 2016-08-02 DIAGNOSIS — L509 Urticaria, unspecified: Secondary | ICD-10-CM | POA: Diagnosis not present

## 2016-08-02 DIAGNOSIS — E1165 Type 2 diabetes mellitus with hyperglycemia: Secondary | ICD-10-CM | POA: Diagnosis not present

## 2016-08-02 DIAGNOSIS — IMO0002 Reserved for concepts with insufficient information to code with codable children: Secondary | ICD-10-CM

## 2016-08-02 MED ORDER — DAPAGLIFLOZIN PROPANEDIOL 5 MG PO TABS: 5.0000 mg | ORAL_TABLET | Freq: Every day | ORAL | 2 refills | Status: DC

## 2016-08-02 MED ORDER — PREDNISONE 10 MG PO TABS: ORAL_TABLET | ORAL | 0 refills | Status: DC

## 2016-08-02 MED ORDER — METHYLPREDNISOLONE ACETATE 80 MG/ML IJ SUSP
80.0000 mg | Freq: Once | INTRAMUSCULAR | Status: AC
  Administered 2016-08-02: 80 mg via INTRAMUSCULAR

## 2016-08-02 NOTE — Assessment & Plan Note (Signed)
pred taper Depo medrol 80 mg  Sliding scale for reg insulin given to pt F/u prn

## 2016-08-02 NOTE — Progress Notes (Signed)
Pre visit review using our clinic review tool, if applicable. No additional management support is needed unless otherwise documented below in the visit note. 

## 2016-08-02 NOTE — Progress Notes (Signed)
Med Admin During Clinic: Verbal order given by provider to check patient's blood sugar prior to administering Depo-medrol 80 mg IM.   Blood sugar:  241.  Verbal order given by provider to administer 4 units of Novolog insulin using flexpen.  4 units of Novolog given subcutaneously in the left lower abdomen.  Pt tolerated injection well.  No signs of a reaction noted.  She was advised to recheck blood sugar in 1 hour.  The same was written on her AVS. 80 mg of Depo-medrol given IM Left ventrogluteal.  Pt tolerated injection well.  No signs of a reaction noted.   Patient teaching: Provider placed patient on a sliding scale.  Sliding scale reviewed with patient.  Verbal testing utilized to test patient's understanding on how to use sliding scale.  Pt was able to determine number of units based on blood sugar readings.   Pt was also educated on how to self administer Novolog insulin using Flexpen.  Pt stated understanding and was able to provide a return demonstration without difficulty.    Novolog Insulin Flexpen given.  Lot# [id number]; [place] [phone]   Pt was advised to call office with any questions or concerns.   She agreed to do so.

## 2016-08-02 NOTE — Patient Instructions (Signed)
Sliding scale regular insulin  200-250      2 u  251-300      4 u 301-350      6 u 351-400      8 u > 400    10 u   Call dr    Check blood sugars 4x a day -- fasting  And 2 hours after each meal Give yourself insulin based on the sale

## 2016-08-02 NOTE — Progress Notes (Signed)
Patient ID: Katelyn Mahoney, female    DOB: 1934-11-07  Age: 82 y.o. MRN: [id number]    Subjective:  Subjective  HPI Katelyn Mahoney presents for rash on arms and abd x several days.  She had the same thing about 2 months ago and went to derm but it was gone by the time she got there.  She was given a cream.   Review of Systems  Constitutional: Negative for appetite change, diaphoresis, fatigue and unexpected weight change.  Eyes: Negative for pain, redness and visual disturbance.  Respiratory: Negative for cough, chest tightness, shortness of breath and wheezing.   Cardiovascular: Negative for chest pain, palpitations and leg swelling.  Endocrine: Negative for cold intolerance, heat intolerance, polydipsia, polyphagia and polyuria.  Genitourinary: Negative for difficulty urinating, dysuria and frequency.  Skin: Positive for color change and rash.  Neurological: Negative for dizziness, light-headedness, numbness and headaches.    History Past Medical History:  Diagnosis Date  . Allergy   . Anxiety   . Arthritis   . Arthritis   . Callus   . Depression   . Diabetes mellitus without complication ([place])   . Hyperlipidemia   . Hypertension     She has a past surgical history that includes Cholecystectomy; Abdominal hysterectomy; and Splenectomy.   Her family history includes Cancer in her brother, father, mother, and sister.She reports that she has never smoked. She has never used smokeless tobacco. She reports that she does not drink alcohol or use drugs.  Current Outpatient Prescriptions on File Prior to Visit  Medication Sig Dispense Refill  . acetaminophen (TYLENOL ARTHRITIS PAIN) 650 MG CR tablet 2 by mouth once daily as needed    . aspirin 81 MG tablet Take 81 mg by mouth. 3 by mouth in the evening    . atorvastatin (LIPITOR) 20 MG tablet TAKE 1/2 TABLET BY MOUTH MONDAY, WEDNESDAY & FRIDAY AND SUNDAY 26 tablet 3  . FLECTOR 1.3 % PTCH APPLY 1 PATCH ONTO THE SKIN TWICE A DAY  (Patient taking differently: APPLY 1 PATCH ONTO THE SKIN TWICE A DAY PRN) 30 patch 2  . furosemide (LASIX) 20 MG tablet Take 1 tablet (20 mg total) by mouth daily. 30 tablet 0  . glipiZIDE (GLUCOTROL XL) 10 MG 24 hr tablet Take 1 tablet (10 mg total) by mouth daily with breakfast. 30 tablet 2  . Glucosamine HCl (GLUCOSAMINE PO) Take by mouth daily.    Marland Kitchen glucose blood (ONE TOUCH TEST STRIPS) test strip 1 each by Other route daily. E11.21 Ultra blue 100 each 3  . Loratadine (CLARITIN) 10 MG CAPS Take 1 capsule by mouth daily as needed.     Marland Kitchen losartan (COZAAR) 100 MG tablet Take 1 tablet (100 mg total) by mouth daily. 90 tablet 3  . metoprolol (LOPRESSOR) 100 MG tablet Take 1 tablet (100 mg total) by mouth 2 (two) times daily. 180 tablet 1  . Multiple Vitamins-Minerals (HAIR/SKIN/NAILS) TABS TAKE 1 TABLET DAILY 90 tablet 2  . multivitamin-lutein (OCUVITE-LUTEIN) CAPS capsule Take 1 capsule by mouth daily. 90 capsule 3  . nystatin ointment (MYCOSTATIN) Apply 1 application topically 2 (two) times daily. (Patient taking differently: Apply 1 application topically as needed. ) 30 g 6  . ONETOUCH DELICA LANCETS 99991111 MISC Use to test blood sugar once daily- E11.21 100 each 3  . sertraline (ZOLOFT) 100 MG tablet Take 1 tablet (100 mg total) by mouth daily. 90 tablet 1  . sitaGLIPtin (JANUVIA) 50 MG tablet Take 1 tablet (50 mg  total) by mouth daily. 90 tablet 1  . Bilberry, Vaccinium myrtillus, (BILBERRY PO) Take 470 mg by mouth. 2 by mouth daily     No current facility-administered medications on file prior to visit.      Objective:  Objective  Physical Exam  Constitutional: She is oriented to person, place, and time. She appears well-developed and well-nourished.  HENT:  Head: Normocephalic and atraumatic.  Eyes: Conjunctivae and EOM are normal.  Neck: Normal range of motion. Neck supple. No JVD present. Carotid bruit is not present. No thyromegaly present.  Cardiovascular: Normal rate, regular  rhythm and normal heart sounds.   No murmur heard. Pulmonary/Chest: Effort normal and breath sounds normal. No respiratory distress. She has no wheezes. She has no rales. She exhibits no tenderness.  Musculoskeletal: She exhibits no edema.  Neurological: She is alert and oriented to person, place, and time.  Skin: Skin is dry and intact. Rash noted. Rash is urticarial. There is erythema.     Psychiatric: She has a normal mood and affect. Her behavior is normal. Judgment and thought content normal.  Nursing note and vitals reviewed.  BP 126/68 (BP Location: Right Arm, Patient Position: Sitting, Cuff Size: Large)   Pulse 85   Temp 98 F (36.7 C) (Oral)   Resp (!) 95   Wt 291 lb 3.2 oz (132.1 kg)   BMI 47.00 kg/m  Wt Readings from Last 3 Encounters:  08/02/16 291 lb 3.2 oz (132.1 kg)  05/03/16 294 lb (133.4 kg)  04/02/16 294 lb 9.6 oz (133.6 kg)     Lab Results  Component Value Date   WBC 14.3 (H) 05/03/2016   HGB 15.5 05/03/2016   HCT 43.2 05/03/2016   PLT 322 05/03/2016   GLUCOSE 259 (H) 04/02/2016   CHOL 190 04/02/2016   TRIG 141.0 04/02/2016   HDL 62.90 04/02/2016   LDLDIRECT 78.3 04/16/2013   LDLCALC 99 04/02/2016   ALT 24 04/02/2016   AST 24 04/02/2016   NA 130 (L) 04/02/2016   K 4.9 04/02/2016   CL 99 04/02/2016   CREATININE 1.53 (H) 04/02/2016   BUN 42 (H) 04/02/2016   CO2 22 04/02/2016   TSH 1.28 07/07/2015   HGBA1C 9.4 (H) 04/02/2016   MICROALBUR 2.3 (H) 04/02/2016    No results found.   Assessment & Plan:  Plan  I am having [name] start on dapagliflozin propanediol and predniSONE. I am also having her maintain her aspirin, acetaminophen, (Bilberry, Vaccinium myrtillus, (BILBERRY PO)), multivitamin-lutein, ONETOUCH DELICA LANCETS 99991111, FLECTOR, nystatin ointment, Loratadine, Glucosamine HCl (GLUCOSAMINE PO), metoprolol, furosemide, sertraline, atorvastatin, losartan, glipiZIDE, sitaGLIPtin, glucose blood, HAIR/SKIN/NAILS, and fluocinonide  cream.  Meds ordered this encounter  Medications  . fluocinonide cream (LIDEX) 0.05 %    Sig: Apply 1 application topically 2 (two) times daily.  . dapagliflozin propanediol (FARXIGA) 5 MG TABS tablet    Sig: Take 5 mg by mouth daily.    Dispense:  30 tablet    Refill:  2  . predniSONE (DELTASONE) 10 MG tablet    Sig: 3 po qd for 3 days then 2 po qd for 3 days the 1 po qd for 3 days    Dispense:  18 tablet    Refill:  0    Problem List Items Addressed This Visit    None    Visit Diagnoses    Hives    -  Primary   Relevant Medications   predniSONE (DELTASONE) 10 MG tablet   DM (diabetes mellitus)  type II uncontrolled, periph vascular disorder (HCC)       Relevant Medications   dapagliflozin propanediol (FARXIGA) 5 MG TABS tablet      Follow-up: No Follow-up on file.  Ann Held, DO

## 2016-08-03 ENCOUNTER — Telehealth: Payer: Self-pay | Admitting: Family Medicine

## 2016-08-03 DIAGNOSIS — E1151 Type 2 diabetes mellitus with diabetic peripheral angiopathy without gangrene: Secondary | ICD-10-CM

## 2016-08-03 DIAGNOSIS — IMO0002 Reserved for concepts with insufficient information to code with codable children: Secondary | ICD-10-CM

## 2016-08-03 DIAGNOSIS — E1165 Type 2 diabetes mellitus with hyperglycemia: Principal | ICD-10-CM

## 2016-08-03 MED ORDER — INSULIN ASPART 100 UNIT/ML FLEXPEN: PEN_INJECTOR | SUBCUTANEOUS | 0 refills | Status: DC

## 2016-08-03 NOTE — Telephone Encounter (Signed)
Pt called in stating that her blood sugar is 410. Transferred call to RN

## 2016-08-03 NOTE — Telephone Encounter (Signed)
Patient called back with her blood sugar reading and it has come down to 381 and reported that she's taken 10 units of Novolog. She voiced not having enough of the sample medication left to get through the weekend.  PCP was made aware and gave verbal for Novolog FlexPen with the same sliding scale in addition to the 2 units discussed earlier. Rx sent to the patient's preferred pharmacy.  Advised patient that if she develops worsening symptoms call the office (triage) or seek the nearest emergency room. She voiced understanding and did not have any further concerns prior to call ending.

## 2016-08-03 NOTE — Telephone Encounter (Signed)
Patient voiced that she checked her blood sugar last at 3:18 PM and it was 410. She does not report any symptoms of increase thirst, dry mouth/dehydration, vomiting, fatigue/weakness, confusion, blurred vision, rapid breathing or increased heart rate.; however she did mention that today she's had frequent urination, but she's not sure if it's due to her elevated blood sugar or not because she has drunk a lot more water than usually. Patient states, "I feel fine".   Also, she discussed with RN that her fasting blood sugar this morning was 268 and she took 4 units of insulin; along with glipizide and januvia; for breakfast she ate 2 pieces of whole wheat toast with cheese and 2 hours later she rechecked her sugar and it was 325. Patient voiced that she then took 6 units of Novolog; at lunch time (1:15 PM) she ate a small cup of yogurt and checked sugar at 3:18 PM and it was 410, she immediately took 10 units of Novolog as instructed by PCP and called here to the office.  PCP was made aware and she advised patient to increase scale by an additional 2 units and recheck blood sugar in an hour. Informed patient of the provider's instructions and she verbalized understanding. Patient did not have any further questions or concerns.

## 2016-08-06 ENCOUNTER — Telehealth: Payer: Self-pay | Admitting: Behavioral Health

## 2016-08-06 NOTE — Telephone Encounter (Signed)
She was given a sample Friday--- is it still running high.

## 2016-08-06 NOTE — Telephone Encounter (Signed)
[facility] note received via fax  Call Date: 08/03/16 Time: 7:21 PM   Caller: Judson Roch (Pharmacist) Return number: [phone]  Nurse: Hillery Jacks, RN  Chief Complaint: Medication Request  Reason for call: Caller states Rx must have maximum daily dose, insurance will not cover sliding scale.  Related visit to physician within the last 2 weeks: NA  Guideline: NA  Disposition: NA   Called to follow-up with the patient. She voiced that she last had insulin on Friday evening and her sugars over the weekend were 229 & 234. Today's reading was 223. She also reported the readings two hours after eating, which were 325, 321 & 331. Patient verbalized that she's been feeling ok.   Routed note to PCP for review.   Please advise. Thanks.

## 2016-08-08 NOTE — Telephone Encounter (Signed)
Patient was unable to pick up the sample on Friday, but came in office today to receive novolog pen. She voiced that her sugars today are in the 200's.

## 2016-08-08 NOTE — Telephone Encounter (Signed)
Getting better.

## 2016-08-09 ENCOUNTER — Ambulatory Visit: Payer: Medicare Other | Admitting: Podiatry

## 2016-08-17 ENCOUNTER — Other Ambulatory Visit: Payer: Self-pay | Admitting: Family Medicine

## 2016-08-17 DIAGNOSIS — I10 Essential (primary) hypertension: Secondary | ICD-10-CM

## 2016-08-17 DIAGNOSIS — IMO0002 Reserved for concepts with insufficient information to code with codable children: Secondary | ICD-10-CM

## 2016-08-17 DIAGNOSIS — E1151 Type 2 diabetes mellitus with diabetic peripheral angiopathy without gangrene: Secondary | ICD-10-CM

## 2016-08-17 DIAGNOSIS — E1165 Type 2 diabetes mellitus with hyperglycemia: Principal | ICD-10-CM

## 2016-08-20 ENCOUNTER — Telehealth: Payer: Self-pay | Admitting: Family Medicine

## 2016-08-20 ENCOUNTER — Other Ambulatory Visit: Payer: Self-pay | Admitting: Family Medicine

## 2016-08-20 DIAGNOSIS — R600 Localized edema: Secondary | ICD-10-CM

## 2016-08-20 DIAGNOSIS — E1165 Type 2 diabetes mellitus with hyperglycemia: Principal | ICD-10-CM

## 2016-08-20 DIAGNOSIS — E1151 Type 2 diabetes mellitus with diabetic peripheral angiopathy without gangrene: Secondary | ICD-10-CM

## 2016-08-20 DIAGNOSIS — IMO0002 Reserved for concepts with insufficient information to code with codable children: Secondary | ICD-10-CM

## 2016-08-20 NOTE — Telephone Encounter (Signed)
Pharmacy:  [facility] [place] [phone] (Phone) [phone] (Fax)     Reason for call: Needs call back in ref to glipiZIDE (GLUCOTROL XL) 5 MG 24 hr tablet [id number]  Refer to ref # [id number]

## 2016-08-21 MED ORDER — GLIPIZIDE ER 10 MG PO TB24: 10.0000 mg | ORAL_TABLET | Freq: Every day | ORAL | 1 refills | Status: DC

## 2016-08-21 NOTE — Telephone Encounter (Signed)
I called to review the medication with the patient b/c Dr.Lowne increased her dose to the 10 mg, she advised that she is taking the 10 mg of the glipizide XL.[facility] an cancelled the 5 mg tablet that was sent in.      KP

## 2016-08-29 ENCOUNTER — Other Ambulatory Visit: Payer: Self-pay | Admitting: Family Medicine

## 2016-08-30 ENCOUNTER — Encounter: Payer: Self-pay | Admitting: Family Medicine

## 2016-08-30 LAB — HM DIABETES EYE EXAM

## 2016-09-20 ENCOUNTER — Other Ambulatory Visit: Payer: Self-pay | Admitting: Family Medicine

## 2016-09-20 DIAGNOSIS — E1165 Type 2 diabetes mellitus with hyperglycemia: Principal | ICD-10-CM

## 2016-09-20 DIAGNOSIS — E1151 Type 2 diabetes mellitus with diabetic peripheral angiopathy without gangrene: Secondary | ICD-10-CM

## 2016-09-20 DIAGNOSIS — IMO0002 Reserved for concepts with insufficient information to code with codable children: Secondary | ICD-10-CM

## 2016-10-02 ENCOUNTER — Encounter: Payer: Self-pay | Admitting: Family Medicine

## 2016-10-02 ENCOUNTER — Ambulatory Visit (INDEPENDENT_AMBULATORY_CARE_PROVIDER_SITE_OTHER): Payer: Medicare Other | Admitting: Family Medicine

## 2016-10-02 VITALS — BP 97/64 | HR 61 | Temp 98.2°F | Ht 66.0 in | Wt 183.2 lb

## 2016-10-02 DIAGNOSIS — IMO0002 Reserved for concepts with insufficient information to code with codable children: Secondary | ICD-10-CM

## 2016-10-02 DIAGNOSIS — E785 Hyperlipidemia, unspecified: Secondary | ICD-10-CM

## 2016-10-02 DIAGNOSIS — I10 Essential (primary) hypertension: Secondary | ICD-10-CM | POA: Diagnosis not present

## 2016-10-02 DIAGNOSIS — F329 Major depressive disorder, single episode, unspecified: Secondary | ICD-10-CM

## 2016-10-02 DIAGNOSIS — F32A Depression, unspecified: Secondary | ICD-10-CM

## 2016-10-02 DIAGNOSIS — E1151 Type 2 diabetes mellitus with diabetic peripheral angiopathy without gangrene: Secondary | ICD-10-CM

## 2016-10-02 DIAGNOSIS — E1122 Type 2 diabetes mellitus with diabetic chronic kidney disease: Secondary | ICD-10-CM

## 2016-10-02 DIAGNOSIS — Z23 Encounter for immunization: Secondary | ICD-10-CM | POA: Diagnosis not present

## 2016-10-02 DIAGNOSIS — E1165 Type 2 diabetes mellitus with hyperglycemia: Secondary | ICD-10-CM

## 2016-10-02 LAB — LIPID PANEL
CHOLESTEROL: 183 mg/dL (ref 0–200)
HDL: 60.1 mg/dL (ref >39.00)
LDL Cholesterol: 93 mg/dL (ref 0–99)
NonHDL: 122.93
TRIGLYCERIDES: 152 mg/dL — AB (ref 0.0–149.0)
Total CHOL/HDL Ratio: 3
VLDL: 30.4 mg/dL (ref 0.0–40.0)

## 2016-10-02 LAB — POCT URINALYSIS DIPSTICK
Bilirubin, UA: NEGATIVE
Blood, UA: NEGATIVE
GLUCOSE UA: 1000
Ketones, UA: NEGATIVE
LEUKOCYTES UA: NEGATIVE
NITRITE UA: NEGATIVE
PROTEIN UA: NEGATIVE
Spec Grav, UA: 1.02
UROBILINOGEN UA: 0.2
pH, UA: 5.5

## 2016-10-02 LAB — COMPREHENSIVE METABOLIC PANEL
ALBUMIN: 3.5 g/dL (ref 3.5–5.2)
ALK PHOS: 67 U/L (ref 39–117)
ALT: 21 U/L (ref 0–35)
AST: 18 U/L (ref 0–37)
BILIRUBIN TOTAL: 0.7 mg/dL (ref 0.2–1.2)
BUN: 41 mg/dL — ABNORMAL HIGH (ref 6–23)
CALCIUM: 8.8 mg/dL (ref 8.4–10.5)
CHLORIDE: 104 meq/L (ref 96–112)
CO2: 25 mEq/L (ref 19–32)
CREATININE: 1.49 mg/dL — AB (ref 0.40–1.20)
GFR: 35.8 mL/min — ABNORMAL LOW (ref >60.00)
Glucose, Bld: 209 mg/dL — ABNORMAL HIGH (ref 70–99)
Potassium: 5.1 mEq/L (ref 3.5–5.1)
Sodium: 135 mEq/L (ref 135–145)
TOTAL PROTEIN: 7.2 g/dL (ref 6.0–8.3)

## 2016-10-02 LAB — HEMOGLOBIN A1C: Hgb A1c MFr Bld: 8.9 % — ABNORMAL HIGH (ref 4.6–6.5)

## 2016-10-02 MED ORDER — SITAGLIPTIN PHOSPHATE 100 MG PO TABS: 100.0000 mg | ORAL_TABLET | Freq: Every day | ORAL | 1 refills | Status: DC

## 2016-10-02 MED ORDER — DAPAGLIFLOZIN PROPANEDIOL 10 MG PO TABS: 10.0000 mg | ORAL_TABLET | Freq: Every day | ORAL | 1 refills | Status: DC

## 2016-10-02 NOTE — Progress Notes (Signed)
Patient ID: Katelyn Mahoney, female    DOB: [birth year]  Age: 82 y.o. MRN: [id number]    Subjective:  Subjective  HPI Katelyn Mahoney presents for dmII, cholesterol and bp.  HYPERTENSION   Blood pressure range-124/64  Chest pain- no      Dyspnea- no Lightheadedness- no   Edema- no worse than usual  Other side effects - no   Medication compliance: good Low salt diet- yes     DIABETES    Blood Sugar ranges-201-340  Polyuria- no New Visual problems- no  Hypoglycemic symptoms- no  Other side effects-no Medication compliance - good Last eye exam- aug 2017 Foot exam- today   HYPERLIPIDEMIA  Medication compliance- good RUQ pain- no  Muscle aches- no Other side effects-no  Review of Systems  Constitutional: Negative for appetite change, diaphoresis, fatigue and unexpected weight change.  Eyes: Negative for pain, redness and visual disturbance.  Respiratory: Negative for cough, chest tightness, shortness of breath and wheezing.   Cardiovascular: Positive for leg swelling. Negative for chest pain and palpitations.  Endocrine: Negative for cold intolerance, heat intolerance, polydipsia, polyphagia and polyuria.  Genitourinary: Negative for difficulty urinating, dysuria and frequency.  Neurological: Negative for dizziness, light-headedness, numbness and headaches.    History Past Medical History:  Diagnosis Date  . Allergy   . Anxiety   . Arthritis   . Arthritis   . Callus   . Depression   . Diabetes mellitus without complication ([place])   . Hyperlipidemia   . Hypertension     She has a past surgical history that includes Cholecystectomy; Abdominal hysterectomy; and Splenectomy.   Her family history includes Cancer in her brother, father, mother, and sister.She reports that she has never smoked. She has never used smokeless tobacco. She reports that she does not drink alcohol or use drugs.  Current Outpatient Prescriptions on File Prior to Visit  Medication Sig  Dispense Refill  . acetaminophen (TYLENOL ARTHRITIS PAIN) 650 MG CR tablet 2 by mouth once daily as needed    . aspirin 81 MG tablet Take 81 mg by mouth. 3 by mouth in the evening    . atorvastatin (LIPITOR) 20 MG tablet TAKE 1/2 TABLET BY MOUTH MONDAY, WEDNESDAY & FRIDAY AND SUNDAY 26 tablet 3  . FLECTOR 1.3 % PTCH APPLY 1 PATCH ONTO THE SKIN TWICE A DAY (Patient taking differently: APPLY 1 PATCH ONTO THE SKIN TWICE A DAY PRN) 30 patch 2  . fluocinonide cream (LIDEX) 3.08 % Apply 1 application topically 2 (two) times daily.    . furosemide (LASIX) 20 MG tablet TAKE 1 TABLET DAILY 90 tablet 0  . glipiZIDE (GLUCOTROL XL) 10 MG 24 hr tablet Take 1 tablet (10 mg total) by mouth daily with breakfast. 90 tablet 1  . Glucosamine HCl (GLUCOSAMINE PO) Take by mouth daily.    Marland Kitchen glucose blood (ONE TOUCH TEST STRIPS) test strip 1 each by Other route daily. E11.21 Ultra blue 100 each 3  . Loratadine (CLARITIN) 10 MG CAPS Take 1 capsule by mouth daily as needed.     Marland Kitchen losartan (COZAAR) 100 MG tablet Take 1 tablet (100 mg total) by mouth daily. 90 tablet 3  . metoprolol (LOPRESSOR) 100 MG tablet TAKE 1 TABLET TWICE A DAY 180 tablet 1  . multivitamin-lutein (OCUVITE-LUTEIN) CAPS capsule Take 1 capsule by mouth daily. 90 capsule 3  . nystatin ointment (MYCOSTATIN) Apply 1 application topically 2 (two) times daily. (Patient taking differently: Apply 1 application topically as needed. ) 30 g 6  .  ONETOUCH DELICA LANCETS 01V MISC Use to test blood sugar once daily- E11.21 100 each 3  . predniSONE (DELTASONE) 10 MG tablet 3 po qd for 3 days then 2 po qd for 3 days the 1 po qd for 3 days 18 tablet 0  . sertraline (ZOLOFT) 100 MG tablet TAKE 1 TABLET DAILY 90 tablet 1  . Bilberry, Vaccinium myrtillus, (BILBERRY PO) Take 470 mg by mouth. 2 by mouth daily    . Multiple Vitamins-Minerals (HAIR/SKIN/NAILS) TABS TAKE 1 TABLET DAILY (Patient not taking: Reported on 10/02/2016) 90 tablet 2   No current facility-administered  medications on file prior to visit.      Objective:  Objective  Physical Exam  Constitutional: She is oriented to person, place, and time. She appears well-developed and well-nourished.  HENT:  Head: Normocephalic and atraumatic.  Eyes: Conjunctivae and EOM are normal.  Neck: Normal range of motion. Neck supple. No JVD present. Carotid bruit is not present. No thyromegaly present.  Cardiovascular: Normal rate, regular rhythm and normal heart sounds.   No murmur heard. Pulmonary/Chest: Effort normal and breath sounds normal. No respiratory distress. She has no wheezes. She has no rales. She exhibits no tenderness.  Musculoskeletal: She exhibits edema.  Neurological: She is alert and oriented to person, place, and time.  Psychiatric: She has a normal mood and affect.  Nursing note and vitals reviewed. Sensory exam of the foot is normal, tested with the monofilament. Good pulses, no lesions or ulcers, good peripheral pulses.  BP 97/64 (BP Location: Left Arm, Patient Position: Sitting, Cuff Size: Large)   Pulse 61   Temp 98.2 F (36.8 C) (Oral)   Ht 5\' 6"  (1.676 m)   Wt 183 lb 3.2 oz (83.1 kg)   SpO2 99%   BMI 29.57 kg/m  Wt Readings from Last 3 Encounters:  10/02/16 183 lb 3.2 oz (83.1 kg)  08/02/16 291 lb 3.2 oz (132.1 kg)  05/03/16 294 lb (133.4 kg)     Lab Results  Component Value Date   WBC 14.3 (H) 05/03/2016   HGB 15.5 05/03/2016   HCT 43.2 05/03/2016   PLT 322 05/03/2016   GLUCOSE 209 (H) 10/02/2016   CHOL 183 10/02/2016   TRIG 152.0 (H) 10/02/2016   HDL 60.10 10/02/2016   LDLDIRECT 78.3 04/16/2013   LDLCALC 93 10/02/2016   ALT 21 10/02/2016   AST 18 10/02/2016   NA 135 10/02/2016   K 5.1 10/02/2016   CL 104 10/02/2016   CREATININE 1.49 (H) 10/02/2016   BUN 41 (H) 10/02/2016   CO2 25 10/02/2016   TSH 1.28 07/07/2015   HGBA1C 8.9 (H) 10/02/2016   MICROALBUR 2.3 (H) 04/02/2016    No results found.   Assessment & Plan:  Plan  I have discontinued Ms.  Katelyn Mahoney's sitaGLIPtin, insulin aspart, and dapagliflozin propanediol. I am also having her start on dapagliflozin propanediol and sitaGLIPtin. Additionally, I am having her maintain her aspirin, acetaminophen, (Bilberry, Vaccinium myrtillus, (BILBERRY PO)), multivitamin-lutein, ONETOUCH DELICA LANCETS 49S, FLECTOR, nystatin ointment, Loratadine, Glucosamine HCl (GLUCOSAMINE PO), atorvastatin, losartan, glucose blood, HAIR/SKIN/NAILS, fluocinonide cream, predniSONE, metoprolol, furosemide, glipiZIDE, and sertraline.  Meds ordered this encounter  Medications  . dapagliflozin propanediol (FARXIGA) 10 MG TABS tablet    Sig: Take 10 mg by mouth daily.    Dispense:  90 tablet    Refill:  1  . sitaGLIPtin (JANUVIA) 100 MG tablet    Sig: Take 1 tablet (100 mg total) by mouth daily.    Dispense:  90  tablet    Refill:  1    Problem List Items Addressed This Visit      Unprioritized   Essential hypertension    Running low Decrease losartan to 50 mg daily       Relevant Orders   Comprehensive metabolic panel (Completed)   POCT urinalysis dipstick (Completed)    Other Visit Diagnoses    Controlled type 2 diabetes mellitus with chronic kidney disease, unspecified CKD stage, unspecified long term insulin use status (HCC)    -  Primary   Relevant Medications   dapagliflozin propanediol (FARXIGA) 10 MG TABS tablet   sitaGLIPtin (JANUVIA) 100 MG tablet   Other Relevant Orders   Comprehensive metabolic panel (Completed)   Hemoglobin A1c (Completed)   POCT urinalysis dipstick (Completed)   Hyperlipidemia LDL goal <70       Relevant Orders   Comprehensive metabolic panel (Completed)   Lipid panel (Completed)   Encounter for immunization       Relevant Orders   Flu vaccine HIGH DOSE PF (Completed)   DM (diabetes mellitus) type II uncontrolled, periph vascular disorder (HCC)       Relevant Medications   dapagliflozin propanediol (FARXIGA) 10 MG TABS tablet   sitaGLIPtin (JANUVIA) 100 MG tablet        Follow-up: Return in about 3 months (around 01/02/2017) for hypertension, hyperlipidemia, diabetes II.  Ann Held, DO

## 2016-10-02 NOTE — Patient Instructions (Signed)

## 2016-10-02 NOTE — Progress Notes (Signed)
Pre visit review using our clinic review tool, if applicable. No additional management support is needed unless otherwise documented below in the visit note. 

## 2016-10-03 MED ORDER — GLIPIZIDE ER 10 MG PO TB24: 10.0000 mg | ORAL_TABLET | Freq: Every day | ORAL | 1 refills | Status: DC

## 2016-10-03 MED ORDER — METOPROLOL TARTRATE 100 MG PO TABS: 100.0000 mg | ORAL_TABLET | Freq: Two times a day (BID) | ORAL | 1 refills | Status: DC

## 2016-10-03 MED ORDER — ATORVASTATIN CALCIUM 20 MG PO TABS: ORAL_TABLET | ORAL | 3 refills | Status: DC

## 2016-10-03 MED ORDER — SERTRALINE HCL 100 MG PO TABS: 100.0000 mg | ORAL_TABLET | Freq: Every day | ORAL | 1 refills | Status: DC

## 2016-10-03 NOTE — Assessment & Plan Note (Signed)
Running low Decrease losartan to 50 mg daily

## 2016-10-04 ENCOUNTER — Telehealth: Payer: Self-pay

## 2016-10-04 NOTE — Telephone Encounter (Signed)
Patient has been made aware and verbalized understanding. She will call with any concerns.    KP

## 2016-10-04 NOTE — Telephone Encounter (Signed)
-----   Message from Ann Held, DO sent at 10/03/2016  5:15 PM EDT ----- Pt bp during ov was low but she said it ran higher at home I would like her to check bp at home and let us know how they are running---  If really running 90s / 50s ---  Cut dose cozaar to 50 mg

## 2016-10-12 ENCOUNTER — Other Ambulatory Visit: Payer: Self-pay

## 2016-10-12 DIAGNOSIS — IMO0002 Reserved for concepts with insufficient information to code with codable children: Secondary | ICD-10-CM

## 2016-10-12 DIAGNOSIS — E1165 Type 2 diabetes mellitus with hyperglycemia: Principal | ICD-10-CM

## 2016-10-12 DIAGNOSIS — E1151 Type 2 diabetes mellitus with diabetic peripheral angiopathy without gangrene: Secondary | ICD-10-CM

## 2016-10-12 MED ORDER — GLIPIZIDE ER 10 MG PO TB24: 10.0000 mg | ORAL_TABLET | Freq: Two times a day (BID) | ORAL | 1 refills | Status: DC

## 2016-11-23 ENCOUNTER — Other Ambulatory Visit: Payer: Self-pay | Admitting: Family Medicine

## 2016-11-23 DIAGNOSIS — R600 Localized edema: Secondary | ICD-10-CM

## 2016-11-27 NOTE — Telephone Encounter (Signed)
Medication filled to pharmacy as requested.   

## 2017-01-04 ENCOUNTER — Ambulatory Visit: Payer: Medicare Other | Admitting: Family Medicine

## 2017-02-07 ENCOUNTER — Ambulatory Visit (INDEPENDENT_AMBULATORY_CARE_PROVIDER_SITE_OTHER): Payer: Medicare Other | Admitting: Family Medicine

## 2017-02-07 ENCOUNTER — Encounter: Payer: Self-pay | Admitting: Family Medicine

## 2017-02-07 VITALS — BP 130/60 | HR 63 | Temp 98.0°F | Resp 18 | Ht 66.0 in | Wt 278.2 lb

## 2017-02-07 DIAGNOSIS — E1151 Type 2 diabetes mellitus with diabetic peripheral angiopathy without gangrene: Secondary | ICD-10-CM | POA: Diagnosis not present

## 2017-02-07 DIAGNOSIS — E1122 Type 2 diabetes mellitus with diabetic chronic kidney disease: Secondary | ICD-10-CM

## 2017-02-07 DIAGNOSIS — E785 Hyperlipidemia, unspecified: Secondary | ICD-10-CM | POA: Diagnosis not present

## 2017-02-07 DIAGNOSIS — E1121 Type 2 diabetes mellitus with diabetic nephropathy: Secondary | ICD-10-CM

## 2017-02-07 DIAGNOSIS — E118 Type 2 diabetes mellitus with unspecified complications: Secondary | ICD-10-CM

## 2017-02-07 DIAGNOSIS — E1165 Type 2 diabetes mellitus with hyperglycemia: Secondary | ICD-10-CM

## 2017-02-07 DIAGNOSIS — R600 Localized edema: Secondary | ICD-10-CM

## 2017-02-07 DIAGNOSIS — I10 Essential (primary) hypertension: Secondary | ICD-10-CM | POA: Diagnosis not present

## 2017-02-07 DIAGNOSIS — IMO0002 Reserved for concepts with insufficient information to code with codable children: Secondary | ICD-10-CM

## 2017-02-07 MED ORDER — GLUCOSE BLOOD VI STRP: ORAL_STRIP | 3 refills | Status: DC

## 2017-02-07 MED ORDER — ONETOUCH DELICA LANCETS 33G MISC: 3 refills | Status: DC

## 2017-02-07 NOTE — Patient Instructions (Signed)

## 2017-02-07 NOTE — Assessment & Plan Note (Signed)
Encouraged heart healthy diet, increase exercise, avoid trans fats, consider a krill oil cap daily 

## 2017-02-07 NOTE — Assessment & Plan Note (Signed)
Well controlled, no changes to meds. Encouraged heart healthy diet such as the DASH diet and exercise as tolerated.  °

## 2017-02-07 NOTE — Progress Notes (Deleted)
Patient ID: Katelyn Mahoney, female   DOB: Oct 17, 1934, 83 y.o.   MRN: [id number]   I acted as a Education administrator for Dr. Carollee Herter.  Guerry Bruin, CMA    Subjective:    Patient ID: Katelyn Mahoney, female    DOB: 09/23/1934, 83 y.o.   MRN: [id number]  Chief Complaint  Patient presents with  . Follow-up  . Hyperlipidemia  . Hypertension  . Diabetes    Hyperlipidemia  This is a chronic problem. The current episode started more than 1 year ago. The problem is controlled. Recent lipid tests were reviewed and are normal. There are no compliance problems.   Hypertension  This is a chronic problem. The current episode started more than 1 year ago. The problem is controlled.  Diabetes  She presents for her follow-up diabetic visit. She has type 2 diabetes mellitus. No MedicAlert identification noted. Her disease course has been stable. Symptoms are stable. She is compliant with treatment all of the time. She has not had a previous visit with a dietitian. She never participates in exercise. (Runs 180-190) She sees a podiatrist.Eye exam is current.    Patient is in today for follow up blood pressure, diabetes and cholesterol.  Past Medical History:  Diagnosis Date  . Allergy   . Anxiety   . Arthritis   . Arthritis   . Callus   . Depression   . Diabetes mellitus without complication ([place])   . Hyperlipidemia   . Hypertension     Past Surgical History:  Procedure Laterality Date  . ABDOMINAL HYSTERECTOMY    . CHOLECYSTECTOMY    . SPLENECTOMY      Family History  Problem Relation Age of Onset  . Cancer Mother   . Cancer Father   . Cancer Sister   . Cancer Brother     Social History   Social History  . Marital status: Widowed    Spouse name: N/A  . Number of children: N/A  . Years of education: N/A   Occupational History  . Not on file.   Social History Main Topics  . Smoking status: Never Smoker  . Smokeless tobacco: Never Used  . Alcohol use No  . Drug use: No  . Sexual  activity: No   Other Topics Concern  . Not on file   Social History Narrative  . No narrative on file    Outpatient Medications Prior to Visit  Medication Sig Dispense Refill  . acetaminophen (TYLENOL ARTHRITIS PAIN) 650 MG CR tablet 2 by mouth once daily as needed    . aspirin 81 MG tablet Take 81 mg by mouth. 3 by mouth in the evening    . atorvastatin (LIPITOR) 20 MG tablet TAKE 1/2 TABLET BY MOUTH MONDAY, WEDNESDAY & FRIDAY AND SUNDAY 26 tablet 3  . Bilberry, Vaccinium myrtillus, (BILBERRY PO) Take 470 mg by mouth. 2 by mouth daily    . dapagliflozin propanediol (FARXIGA) 10 MG TABS tablet Take 10 mg by mouth daily. 90 tablet 1  . FLECTOR 1.3 % PTCH APPLY 1 PATCH ONTO THE SKIN TWICE A DAY (Patient taking differently: APPLY 1 PATCH ONTO THE SKIN TWICE A DAY PRN) 30 patch 2  . fluocinonide cream (LIDEX) 0.94 % Apply 1 application topically 2 (two) times daily.    . furosemide (LASIX) 20 MG tablet TAKE 1 TABLET DAILY 90 tablet 0  . glipiZIDE (GLUCOTROL XL) 10 MG 24 hr tablet Take 1 tablet (10 mg total) by mouth 2 (two) times daily. 180 tablet 1  .  Glucosamine HCl (GLUCOSAMINE PO) Take by mouth daily.    Marland Kitchen glucose blood (ONE TOUCH TEST STRIPS) test strip 1 each by Other route daily. E11.21 Ultra blue 100 each 3  . Loratadine (CLARITIN) 10 MG CAPS Take 1 capsule by mouth daily as needed.     Marland Kitchen losartan (COZAAR) 100 MG tablet Take 1 tablet (100 mg total) by mouth daily. (Patient taking differently: Take 50 mg by mouth daily. ) 90 tablet 3  . metoprolol (LOPRESSOR) 100 MG tablet Take 1 tablet (100 mg total) by mouth 2 (two) times daily. 180 tablet 1  . Multiple Vitamins-Minerals (HAIR/SKIN/NAILS) TABS TAKE 1 TABLET DAILY 90 tablet 2  . multivitamin-lutein (OCUVITE-LUTEIN) CAPS capsule Take 1 capsule by mouth daily. 90 capsule 3  . nystatin ointment (MYCOSTATIN) Apply 1 application topically 2 (two) times daily. (Patient taking differently: Apply 1 application topically as needed. ) 30 g 6  .  ONETOUCH DELICA LANCETS 33A MISC Use to test blood sugar once daily- E11.21 100 each 3  . predniSONE (DELTASONE) 10 MG tablet 3 po qd for 3 days then 2 po qd for 3 days the 1 po qd for 3 days 18 tablet 0  . sertraline (ZOLOFT) 100 MG tablet Take 1 tablet (100 mg total) by mouth daily. 90 tablet 1  . sitaGLIPtin (JANUVIA) 100 MG tablet Take 1 tablet (100 mg total) by mouth daily. 90 tablet 1   No facility-administered medications prior to visit.     Allergies  Allergen Reactions  . Bupropion Hcl     Palmar rash  . Penicillins     rash  . Amlodipine Besylate     Cough   . Citalopram Hydrobromide Other (See Comments)    Unknown  . Metformin And Related Diarrhea    ROS     Objective:    Physical Exam  BP 130/60 (BP Location: Left Arm, Cuff Size: Large)   Pulse 63   Temp 98 F (36.7 C) (Oral)   Resp 18   Ht 5\' 6"  (1.676 m)   Wt 278 lb 3.2 oz (126.2 kg)   SpO2 98%   BMI 44.90 kg/m  Wt Readings from Last 3 Encounters:  02/07/17 278 lb 3.2 oz (126.2 kg)  10/02/16 183 lb 3.2 oz (83.1 kg)  08/02/16 291 lb 3.2 oz (132.1 kg)     Lab Results  Component Value Date   WBC 14.3 (H) 05/03/2016   HGB 15.5 05/03/2016   HCT 43.2 05/03/2016   PLT 322 05/03/2016   GLUCOSE 209 (H) 10/02/2016   CHOL 183 10/02/2016   TRIG 152.0 (H) 10/02/2016   HDL 60.10 10/02/2016   LDLDIRECT 78.3 04/16/2013   LDLCALC 93 10/02/2016   ALT 21 10/02/2016   AST 18 10/02/2016   NA 135 10/02/2016   K 5.1 10/02/2016   CL 104 10/02/2016   CREATININE 1.49 (H) 10/02/2016   BUN 41 (H) 10/02/2016   CO2 25 10/02/2016   TSH 1.28 07/07/2015   HGBA1C 8.9 (H) 10/02/2016   MICROALBUR 2.3 (H) 04/02/2016    Lab Results  Component Value Date   TSH 1.28 07/07/2015   Lab Results  Component Value Date   WBC 14.3 (H) 05/03/2016   HGB 15.5 05/03/2016   HCT 43.2 05/03/2016   MCV 95 05/03/2016   PLT 322 05/03/2016   Lab Results  Component Value Date   NA 135 10/02/2016   K 5.1 10/02/2016   CO2 25  10/02/2016   GLUCOSE 209 (H) 10/02/2016  BUN 41 (H) 10/02/2016   CREATININE 1.49 (H) 10/02/2016   BILITOT 0.7 10/02/2016   ALKPHOS 67 10/02/2016   AST 18 10/02/2016   ALT 21 10/02/2016   PROT 7.2 10/02/2016   ALBUMIN 3.5 10/02/2016   CALCIUM 8.8 10/02/2016   GFR 35.80 (L) 10/02/2016   Lab Results  Component Value Date   CHOL 183 10/02/2016   Lab Results  Component Value Date   HDL 60.10 10/02/2016   Lab Results  Component Value Date   LDLCALC 93 10/02/2016   Lab Results  Component Value Date   TRIG 152.0 (H) 10/02/2016   Lab Results  Component Value Date   CHOLHDL 3 10/02/2016   Lab Results  Component Value Date   HGBA1C 8.9 (H) 10/02/2016       Assessment & Plan:   Problem List Items Addressed This Visit    None      I am having [name] maintain her aspirin, acetaminophen, (Bilberry, Vaccinium myrtillus, (BILBERRY PO)), multivitamin-lutein, ONETOUCH DELICA LANCETS 27A, FLECTOR, nystatin ointment, Loratadine, Glucosamine HCl (GLUCOSAMINE PO), losartan, glucose blood, HAIR/SKIN/NAILS, fluocinonide cream, predniSONE, dapagliflozin propanediol, sitaGLIPtin, sertraline, metoprolol, atorvastatin, glipiZIDE, and furosemide.  No orders of the defined types were placed in this encounter.   {PROVIDER TO DELETE} Jerene Dilling, CMA

## 2017-02-07 NOTE — Progress Notes (Signed)
Pre visit review using our clinic review tool, if applicable. No additional management support is needed unless otherwise documented below in the visit note. 

## 2017-02-07 NOTE — Assessment & Plan Note (Signed)
hgba1c acceptable, minimize simple carbs. Increase exercise as tolerated. Continue current meds 

## 2017-02-08 LAB — LIPID PANEL
CHOLESTEROL: 187 mg/dL (ref 0–200)
HDL: 56.3 mg/dL (ref >39.00)
LDL Cholesterol: 91 mg/dL (ref 0–99)
NonHDL: 130.63
TRIGLYCERIDES: 197 mg/dL — AB (ref 0.0–149.0)
Total CHOL/HDL Ratio: 3
VLDL: 39.4 mg/dL (ref 0.0–40.0)

## 2017-02-08 LAB — COMPREHENSIVE METABOLIC PANEL
ALT: 18 U/L (ref 0–35)
AST: 16 U/L (ref 0–37)
Albumin: 3.7 g/dL (ref 3.5–5.2)
Alkaline Phosphatase: 75 U/L (ref 39–117)
BILIRUBIN TOTAL: 0.5 mg/dL (ref 0.2–1.2)
BUN: 56 mg/dL — ABNORMAL HIGH (ref 6–23)
CALCIUM: 9.5 mg/dL (ref 8.4–10.5)
CO2: 23 meq/L (ref 19–32)
Chloride: 105 mEq/L (ref 96–112)
Creatinine, Ser: 1.59 mg/dL — ABNORMAL HIGH (ref 0.40–1.20)
GFR: 33.19 mL/min — AB (ref >60.00)
Glucose, Bld: 226 mg/dL — ABNORMAL HIGH (ref 70–99)
Potassium: 5 mEq/L (ref 3.5–5.1)
Sodium: 134 mEq/L — ABNORMAL LOW (ref 135–145)
Total Protein: 7.1 g/dL (ref 6.0–8.3)

## 2017-02-08 LAB — HEMOGLOBIN A1C: Hgb A1c MFr Bld: 8.8 % — ABNORMAL HIGH (ref 4.6–6.5)

## 2017-02-11 ENCOUNTER — Other Ambulatory Visit: Payer: Self-pay | Admitting: Family Medicine

## 2017-02-11 DIAGNOSIS — E119 Type 2 diabetes mellitus without complications: Secondary | ICD-10-CM

## 2017-02-21 ENCOUNTER — Other Ambulatory Visit: Payer: Self-pay | Admitting: Family Medicine

## 2017-02-21 DIAGNOSIS — E1122 Type 2 diabetes mellitus with diabetic chronic kidney disease: Secondary | ICD-10-CM

## 2017-02-25 MED ORDER — SITAGLIPTIN PHOSPHATE 100 MG PO TABS: 100.0000 mg | ORAL_TABLET | Freq: Every day | ORAL | 1 refills | Status: DC

## 2017-02-25 MED ORDER — GLIPIZIDE ER 10 MG PO TB24: 10.0000 mg | ORAL_TABLET | Freq: Two times a day (BID) | ORAL | 1 refills | Status: DC

## 2017-02-25 MED ORDER — ATORVASTATIN CALCIUM 20 MG PO TABS
ORAL_TABLET | ORAL | 3 refills | Status: DC
Start: 2017-02-25 — End: 2017-08-19

## 2017-02-25 MED ORDER — METOPROLOL TARTRATE 100 MG PO TABS: 100.0000 mg | ORAL_TABLET | Freq: Two times a day (BID) | ORAL | 1 refills | Status: DC

## 2017-02-25 MED ORDER — FUROSEMIDE 20 MG PO TABS: 20.0000 mg | ORAL_TABLET | Freq: Every day | ORAL | 0 refills | Status: DC

## 2017-02-25 NOTE — Progress Notes (Signed)
Patient ID: Katelyn Mahoney, female    DOB: 1934/08/30  Age: 83 y.o. MRN: [id number]    Subjective:  Subjective  HPI Latrica Clowers presents for  HPI HYPERTENSION   Blood pressure range-not checing   Chest pain- no      Dyspnea- no Lightheadedness- no   Edema- no  Other side effects - no   Medication compliance: good Low salt diet- yes    DIABETES    Blood Sugar ranges-good per pt  Polyuria- no New Visual problems- no  Hypoglycemic symptoms- no  Other side effects-no Medication compliance - good Last eye exam- utd Foot exam- today   HYPERLIPIDEMIA  Medication compliance- good RUQ pain- no  Muscle aches- no Other side effects-no       Review of Systems  Constitutional: Negative for appetite change, diaphoresis, fatigue and unexpected weight change.  Eyes: Negative for pain, redness and visual disturbance.  Respiratory: Negative for cough, chest tightness, shortness of breath and wheezing.   Cardiovascular: Negative for chest pain, palpitations and leg swelling.  Endocrine: Negative for cold intolerance, heat intolerance, polydipsia, polyphagia and polyuria.  Genitourinary: Negative for difficulty urinating, dysuria and frequency.  Neurological: Negative for dizziness, light-headedness, numbness and headaches.    History Past Medical History:  Diagnosis Date  . Allergy   . Anxiety   . Arthritis   . Arthritis   . Callus   . Depression   . Diabetes mellitus without complication ([place])   . Hyperlipidemia   . Hypertension     She has a past surgical history that includes Cholecystectomy; Abdominal hysterectomy; and Splenectomy.   Her family history includes Cancer in her brother, father, mother, and sister.She reports that she has never smoked. She has never used smokeless tobacco. She reports that she does not drink alcohol or use drugs.  Current Outpatient Prescriptions on File Prior to Visit  Medication Sig Dispense Refill  . acetaminophen (TYLENOL  ARTHRITIS PAIN) 650 MG CR tablet 2 by mouth once daily as needed    . aspirin 81 MG tablet Take 81 mg by mouth. 3 by mouth in the evening    . Bilberry, Vaccinium myrtillus, (BILBERRY PO) Take 470 mg by mouth. 2 by mouth daily    . FLECTOR 1.3 % PTCH APPLY 1 PATCH ONTO THE SKIN TWICE A DAY (Patient taking differently: APPLY 1 PATCH ONTO THE SKIN TWICE A DAY PRN) 30 patch 2  . fluocinonide cream (LIDEX) 4.01 % Apply 1 application topically 2 (two) times daily.    . Glucosamine HCl (GLUCOSAMINE PO) Take by mouth daily.    . Loratadine (CLARITIN) 10 MG CAPS Take 1 capsule by mouth daily as needed.     Marland Kitchen losartan (COZAAR) 100 MG tablet Take 1 tablet (100 mg total) by mouth daily. (Patient taking differently: Take 50 mg by mouth daily. ) 90 tablet 3  . Multiple Vitamins-Minerals (HAIR/SKIN/NAILS) TABS TAKE 1 TABLET DAILY 90 tablet 2  . multivitamin-lutein (OCUVITE-LUTEIN) CAPS capsule Take 1 capsule by mouth daily. 90 capsule 3  . nystatin ointment (MYCOSTATIN) Apply 1 application topically 2 (two) times daily. (Patient taking differently: Apply 1 application topically as needed. ) 30 g 6  . predniSONE (DELTASONE) 10 MG tablet 3 po qd for 3 days then 2 po qd for 3 days the 1 po qd for 3 days 18 tablet 0  . sertraline (ZOLOFT) 100 MG tablet Take 1 tablet (100 mg total) by mouth daily. 90 tablet 1   No current facility-administered medications on file  prior to visit.      Objective:  Objective  Physical Exam  Constitutional: She is oriented to person, place, and time. She appears well-developed and well-nourished.  HENT:  Head: Normocephalic and atraumatic.  Eyes: Conjunctivae and EOM are normal.  Neck: Normal range of motion. Neck supple. No JVD present. Carotid bruit is not present. No thyromegaly present.  Cardiovascular: Normal rate, regular rhythm and normal heart sounds.   No murmur heard. Pulmonary/Chest: Effort normal and breath sounds normal. No respiratory distress. She has no wheezes.  She has no rales. She exhibits no tenderness.  Musculoskeletal: She exhibits no edema.  Neurological: She is alert and oriented to person, place, and time.  Psychiatric: She has a normal mood and affect.  Sensory exam of the foot is normal, tested with the monofilament. Good pulses, no lesions or ulcers, good peripheral pulses. BP 130/60 (BP Location: Left Arm, Cuff Size: Large)   Pulse 63   Temp 98 F (36.7 C) (Oral)   Resp 18   Ht 5\' 6"  (1.676 m)   Wt 278 lb 3.2 oz (126.2 kg)   SpO2 98%   BMI 44.90 kg/m  Wt Readings from Last 3 Encounters:  02/07/17 278 lb 3.2 oz (126.2 kg)  10/02/16 183 lb 3.2 oz (83.1 kg)  08/02/16 291 lb 3.2 oz (132.1 kg)     Lab Results  Component Value Date   WBC 14.3 (H) 05/03/2016   HGB 15.5 05/03/2016   HCT 43.2 05/03/2016   PLT 322 05/03/2016   GLUCOSE 226 (H) 02/07/2017   CHOL 187 02/07/2017   TRIG 197.0 (H) 02/07/2017   HDL 56.30 02/07/2017   LDLDIRECT 78.3 04/16/2013   LDLCALC 91 02/07/2017   ALT 18 02/07/2017   AST 16 02/07/2017   NA 134 (L) 02/07/2017   K 5.0 02/07/2017   CL 105 02/07/2017   CREATININE 1.59 (H) 02/07/2017   BUN 56 (H) 02/07/2017   CO2 23 02/07/2017   TSH 1.28 07/07/2015   HGBA1C 8.8 (H) 02/07/2017   MICROALBUR 2.3 (H) 04/02/2016    No results found.   Assessment & Plan:  Plan  I have discontinued Ms. Heffley's glucose blood. I have also changed her furosemide. Additionally, I am having her start on glucose blood. Lastly, I am having her maintain her aspirin, acetaminophen, (Bilberry, Vaccinium myrtillus, (BILBERRY PO)), multivitamin-lutein, FLECTOR, nystatin ointment, Loratadine, Glucosamine HCl (GLUCOSAMINE PO), losartan, HAIR/SKIN/NAILS, fluocinonide cream, predniSONE, sertraline, ONETOUCH DELICA LANCETS 65K, atorvastatin, glipiZIDE, metoprolol, and sitaGLIPtin.  Meds ordered this encounter  Medications  . glucose blood (ONETOUCH VERIO) test strip    Sig: Use as instructed once a day.  DX: E11.21    Dispense:   100 each    Refill:  3  . ONETOUCH DELICA LANCETS 81E MISC    Sig: Use to test blood sugar once daily- E11.21    Dispense:  100 each    Refill:  3  . atorvastatin (LIPITOR) 20 MG tablet    Sig: TAKE 1/2 TABLET BY MOUTH MONDAY, WEDNESDAY & FRIDAY AND SUNDAY    Dispense:  26 tablet    Refill:  3  . furosemide (LASIX) 20 MG tablet    Sig: Take 1 tablet (20 mg total) by mouth daily.    Dispense:  90 tablet    Refill:  0  . glipiZIDE (GLUCOTROL XL) 10 MG 24 hr tablet    Sig: Take 1 tablet (10 mg total) by mouth 2 (two) times daily.    Dispense:  180  tablet    Refill:  1  . metoprolol (LOPRESSOR) 100 MG tablet    Sig: Take 1 tablet (100 mg total) by mouth 2 (two) times daily.    Dispense:  180 tablet    Refill:  1  . sitaGLIPtin (JANUVIA) 100 MG tablet    Sig: Take 1 tablet (100 mg total) by mouth daily.    Dispense:  90 tablet    Refill:  1    Problem List Items Addressed This Visit      Unprioritized   Diabetes mellitus with nephropathy ([place])    hgba1c acceptable, minimize simple carbs. Increase exercise as tolerated. Continue current meds       Relevant Medications   glucose blood (ONETOUCH VERIO) test strip   ONETOUCH DELICA LANCETS 16X MISC   atorvastatin (LIPITOR) 20 MG tablet   glipiZIDE (GLUCOTROL XL) 10 MG 24 hr tablet   sitaGLIPtin (JANUVIA) 100 MG tablet   Other Relevant Orders   Hemoglobin A1c (Completed)   Essential hypertension    Well controlled, no changes to meds. Encouraged heart healthy diet such as the DASH diet and exercise as tolerated.        Relevant Medications   atorvastatin (LIPITOR) 20 MG tablet   furosemide (LASIX) 20 MG tablet   metoprolol (LOPRESSOR) 100 MG tablet   Other Relevant Orders   Comprehensive metabolic panel (Completed)    Other Visit Diagnoses    Hyperlipidemia LDL goal <70    -  Primary   Relevant Medications   atorvastatin (LIPITOR) 20 MG tablet   furosemide (LASIX) 20 MG tablet   metoprolol (LOPRESSOR) 100 MG tablet     Other Relevant Orders   Lipid panel (Completed)   Edema of lower extremity       Relevant Medications   furosemide (LASIX) 20 MG tablet   DM (diabetes mellitus) type II uncontrolled, periph vascular disorder (HCC)       Relevant Medications   atorvastatin (LIPITOR) 20 MG tablet   furosemide (LASIX) 20 MG tablet   glipiZIDE (GLUCOTROL XL) 10 MG 24 hr tablet   metoprolol (LOPRESSOR) 100 MG tablet   sitaGLIPtin (JANUVIA) 100 MG tablet   Controlled type 2 diabetes mellitus with chronic kidney disease, unspecified CKD stage, unspecified long term insulin use status (HCC)       Relevant Medications   atorvastatin (LIPITOR) 20 MG tablet   glipiZIDE (GLUCOTROL XL) 10 MG 24 hr tablet   sitaGLIPtin (JANUVIA) 100 MG tablet   Uncontrolled type 2 diabetes mellitus with complication, unspecified long term insulin use status (HCC)       Relevant Medications   atorvastatin (LIPITOR) 20 MG tablet   glipiZIDE (GLUCOTROL XL) 10 MG 24 hr tablet   sitaGLIPtin (JANUVIA) 100 MG tablet      Follow-up: Return in about 6 months (around 08/07/2017) for annual exam, fasting.  Ann Held, DO

## 2017-02-27 ENCOUNTER — Other Ambulatory Visit: Payer: Self-pay | Admitting: Family Medicine

## 2017-02-27 ENCOUNTER — Ambulatory Visit (INDEPENDENT_AMBULATORY_CARE_PROVIDER_SITE_OTHER): Payer: Medicare Other | Admitting: Endocrinology

## 2017-02-27 ENCOUNTER — Encounter: Payer: Self-pay | Admitting: Endocrinology

## 2017-02-27 VITALS — BP 128/58 | HR 81 | Ht 66.0 in | Wt 278.0 lb

## 2017-02-27 DIAGNOSIS — E1165 Type 2 diabetes mellitus with hyperglycemia: Secondary | ICD-10-CM | POA: Diagnosis not present

## 2017-02-27 DIAGNOSIS — N1832 Chronic kidney disease, stage 3b: Secondary | ICD-10-CM

## 2017-02-27 DIAGNOSIS — N183 Chronic kidney disease, stage 3 (moderate): Secondary | ICD-10-CM

## 2017-02-27 DIAGNOSIS — R600 Localized edema: Secondary | ICD-10-CM

## 2017-02-27 MED ORDER — DULAGLUTIDE 0.75 MG/0.5ML ~~LOC~~ SOAJ: SUBCUTANEOUS | 0 refills | Status: DC

## 2017-02-27 NOTE — Progress Notes (Signed)
Patient ID: Katelyn Mahoney, female   DOB: 03-Oct-1934, 83 y.o.   MRN: [id number]            Reason for Appointment: Consultation for Type 2 Diabetes  Referring physician: Etter Sjogren   History of Present Illness:          Date of diagnosis of type 2 diabetes mellitus: ?  1983        Background history:   She had previously been on metformin with fairly good control for some time She was also at some point treated with Janumet Apparently subsequently she could not tolerate metformin and had diarrhea This was switched to glipizide about 2 years ago and she is also being treated with Januvia and Chana Bode was started in 9/17  Review of A1c indicates that her levels have been significantly high since 2016  Recent history:       Non-insulin hypoglycemic drugs the patient is taking are: Farxiga 10 mg daily, glipizide ER 10 mg twice a day, Januvia 100 mg daily  Current management, blood sugar patterns and problems identified:  She is being referred here now for persistently high A1c  Her glipizide was increased to twice a day a few weeks ago but she thinks that her sugars are only slightly better  Her fasting blood sugars are mostly over 200  She does not check her readings after meals  Because of a significant amount of neck pain she cannot do much exercise  Also she is limited in how she can prepare her food and is generally eating restaurant meals and does not make any restrictions in this  She is thirsty at times but usually drinks water           Side effects from medications have been: Diarrhea from metformin  Compliance with the medical regimen: Fair Hypoglycemia:   none    Glucose monitoring:  done1  times a day         Glucometer: One Touch Verio       Blood Glucose readings by time of day  from meter :  PREMEAL Breakfast Lunch Dinner Bedtime  Overall   Glucose range: 173-239      Median:        Self-care: The diet that the patient has been following is:  none    Meal times are:  Breakfast is at 9-10 AM Typical meal intake: Breakfast is she is toast.  Lunch is variable or nothing.  Meals are usually chicken sandwich or barbecue, sometimes fast food, pasta.  Snacks: Peanut's, yogurt                Dietician visit, most recent:Never                Exercise:  None  Weight history:249-305  Wt Readings from Last 3 Encounters:  02/27/17 278 lb (126.1 kg)  02/07/17 278 lb 3.2 oz (126.2 kg)  10/02/16 183 lb 3.2 oz (83.1 kg)    Glycemic control:   Lab Results  Component Value Date   HGBA1C 8.8 (H) 02/07/2017   HGBA1C 8.9 (H) 10/02/2016   HGBA1C 9.4 (H) 04/02/2016   Lab Results  Component Value Date   MICROALBUR 2.3 (H) 04/02/2016   LDLCALC 91 02/07/2017   CREATININE 1.59 (H) 02/07/2017   Lab Results  Component Value Date   MICRALBCREAT 4.6 04/02/2016       Allergies as of 02/27/2017      Reactions   Bupropion Hcl    Palmar  rash   Penicillins    rash   Amlodipine Besylate    Cough    Citalopram Hydrobromide Other (See Comments)   Unknown   Metformin And Related Diarrhea      Medication List       Accurate as of 02/27/17  5:15 PM. Always use your most recent med list.          aspirin 81 MG tablet Take 81 mg by mouth. 3 by mouth in the evening   atorvastatin 20 MG tablet Commonly known as:  LIPITOR TAKE 1/2 TABLET BY MOUTH MONDAY, WEDNESDAY & FRIDAY AND SUNDAY   BILBERRY PO Take 470 mg by mouth. 2 by mouth daily   CLARITIN 10 MG Caps Generic drug:  Loratadine Take 1 capsule by mouth daily as needed.   Dulaglutide 0.75 MG/0.5ML Sopn Commonly known as:  TRULICITY Inject in the abdominal skin as directed once a week   FLECTOR 1.3 % Ptch Generic drug:  diclofenac APPLY 1 PATCH ONTO THE SKIN TWICE A DAY   fluocinonide cream 0.05 % Commonly known as:  LIDEX Apply 1 application topically 2 (two) times daily.   furosemide 20 MG tablet Commonly known as:  LASIX Take 1 tablet (20 mg total) by mouth  daily.   glipiZIDE 10 MG 24 hr tablet Commonly known as:  GLUCOTROL XL Take 1 tablet (10 mg total) by mouth 2 (two) times daily.   GLUCOSAMINE PO Take by mouth daily.   glucose blood test strip Commonly known as:  ONETOUCH VERIO Use as instructed once a day.  DX: E11.21   losartan 100 MG tablet Commonly known as:  COZAAR Take 1 tablet (100 mg total) by mouth daily.   metoprolol 100 MG tablet Commonly known as:  LOPRESSOR Take 1 tablet (100 mg total) by mouth 2 (two) times daily.   multivitamin-lutein Caps capsule Take 1 capsule by mouth daily.   HAIR/SKIN/NAILS Tabs TAKE 1 TABLET DAILY   nystatin ointment Commonly known as:  MYCOSTATIN Apply 1 application topically 2 (two) times daily.   ONETOUCH DELICA LANCETS 28U Misc Use to test blood sugar once daily- E11.21   sertraline 100 MG tablet Commonly known as:  ZOLOFT Take 1 tablet (100 mg total) by mouth daily.   TYLENOL ARTHRITIS PAIN 650 MG CR tablet Generic drug:  acetaminophen 2 by mouth once daily as needed       Allergies:  Allergies  Allergen Reactions  . Bupropion Hcl     Palmar rash  . Penicillins     rash  . Amlodipine Besylate     Cough   . Citalopram Hydrobromide Other (See Comments)    Unknown  . Metformin And Related Diarrhea    Past Medical History:  Diagnosis Date  . Allergy   . Anxiety   . Arthritis   . Arthritis   . Callus   . Depression   . Diabetes mellitus without complication ([place])   . Hyperlipidemia   . Hypertension     Past Surgical History:  Procedure Laterality Date  . ABDOMINAL HYSTERECTOMY    . CHOLECYSTECTOMY    . SPLENECTOMY      Family History  Problem Relation Age of Onset  . Cancer Mother   . Cancer Father   . Cancer Sister   . Cancer Brother     Social History:  reports that she has never smoked. She has never used smokeless tobacco. She reports that she does not drink alcohol or use drugs.  Review of Systems  Constitutional: Positive for  weight loss.  HENT: Negative for hoarseness.   Eyes: Negative for double vision.  Respiratory: Negative for shortness of breath.   Cardiovascular: Positive for leg swelling. Negative for chest pain.  Gastrointestinal: Negative for diarrhea and abdominal pain.  Endocrine: Negative for fatigue, general weakness and polydipsia.  Genitourinary: Positive for frequency.  Musculoskeletal: Positive for back pain. Negative for muscle aches.  Skin: Negative for rash.  Neurological: Positive for numbness.       Has mild numbness in her feet and toes, bottom of her feet feel leathery  Psychiatric/Behavioral: Negative for depressed mood.     Lipid history: These are controlled with 20 mg Lipitor    Lab Results  Component Value Date   CHOL 187 02/07/2017   HDL 56.30 02/07/2017   LDLCALC 91 02/07/2017   LDLDIRECT 78.3 04/16/2013   TRIG 197.0 (H) 02/07/2017   CHOLHDL 3 02/07/2017           Hypertension: Currently on 50 mg Losartan  Most recent eye exam was In 8/17, reportedly no retinopathy  Most recent foot exam:01/2017 Also followed by podiatrist  CKD: Not clear why she has renal dysfunction This is followed by nephrologist also has had mild persistent increase in creatinine  Lab Results  Component Value Date   CREATININE 1.59 (H) 02/07/2017   CREATININE 1.49 (H) 10/02/2016   CREATININE 1.53 (H) 04/02/2016   CREATININE 1.42 (H) 11/21/2015      LABS:  No visits with results within 1 Week(s) from this visit.  Latest known visit with results is:  Office Visit on 02/07/2017  Component Date Value Ref Range Status  . Sodium 02/07/2017 134* 135 - 145 mEq/L Final  . Potassium 02/07/2017 5.0  3.5 - 5.1 mEq/L Final  . Chloride 02/07/2017 105  96 - 112 mEq/L Final  . CO2 02/07/2017 23  19 - 32 mEq/L Final  . Glucose, Bld 02/07/2017 226* 70 - 99 mg/dL Final  . BUN 02/07/2017 56* 6 - 23 mg/dL Final  . Creatinine, Ser 02/07/2017 1.59* 0.40 - 1.20 mg/dL Final  . Total Bilirubin  02/07/2017 0.5  0.2 - 1.2 mg/dL Final  . Alkaline Phosphatase 02/07/2017 75  39 - 117 U/L Final  . AST 02/07/2017 16  0 - 37 U/L Final  . ALT 02/07/2017 18  0 - 35 U/L Final  . Total Protein 02/07/2017 7.1  6.0 - 8.3 g/dL Final  . Albumin 02/07/2017 3.7  3.5 - 5.2 g/dL Final  . Calcium 02/07/2017 9.5  8.4 - 10.5 mg/dL Final  . GFR 02/07/2017 33.19* >60.00 mL/min Final  . Cholesterol 02/07/2017 187  0 - 200 mg/dL Final  . Triglycerides 02/07/2017 197.0* 0.0 - 149.0 mg/dL Final  . HDL 02/07/2017 56.30  >39.00 mg/dL Final  . VLDL 02/07/2017 39.4  0.0 - 40.0 mg/dL Final  . LDL Cholesterol 02/07/2017 91  0 - 99 mg/dL Final  . Total CHOL/HDL Ratio 02/07/2017 3   Final  . NonHDL 02/07/2017 130.63   Final  . Hgb A1c MFr Bld 02/07/2017 8.8* 4.6 - 6.5 % Final    Physical Examination:  BP (!) 128/58   Pulse 81   Ht 5\' 6"  (1.676 m)   Wt 278 lb (126.1 kg)   SpO2 96%   BMI 44.87 kg/m   GENERAL:         Patient has generalized obesity.   HEENT:         Eye exam shows normal  external appearance. Fundus exam shows no retinopathy.  Oral exam shows normal mucosa .  NECK:   There is no lymphadenopathy Thyroid is not enlarged and no nodules felt.  Carotids are normal to palpation and no bruit heard LUNGS:         Chest is symmetrical. Lungs are clear to auscultation.Marland Kitchen   HEART:         Heart sounds:  S1 and S2 are normal. No murmur or click heard., no S3 or S4.   ABDOMEN:   There is no distention present. Liver and spleen are not palpable. No other mass or tenderness present.   NEUROLOGICAL:   Ankle jerks are absent bilaterally.    Diabetic Foot Exam - Simple   Simple Foot Form Diabetic Foot exam was performed with the following findings:  Yes 02/27/2017  5:14 PM  Visual Inspection See comments:  Yes Sensation Testing Intact to touch and monofilament testing bilaterally:  Yes Pulse Check See comments:  Yes Comments Has relatively flat feet.  Ankle and some needle edema on the right side  present Pedal pulse is not palpable            Vibration sense is Moderately reduced in distal first toes. MUSCULOSKELETAL:  There is no swelling or deformity of the peripheral joints.  Spine is normal to inspection.   EXTREMITIES:     There is lower leg and pedal edema, mostly on the right. No skin lesions present.Marland Kitchen SKIN:       No rash or lesions of concern.        ASSESSMENT:  Diabetes type 2, uncontrolled With BMI 45   She has had long-standing diabetes which has progressed over the last 2 years at least and A1c is mostly over 8% She has also had a tendency to gain weight Currently on a regimen of Farxiga, glipizide and Januvia  However she has limitations with not being able to make good food choices, and not able to exercise because of low back pain Also because of her renal dysfunction likely not benefiting from using [place] Blood sugars are mostly around 200 fasting, she does not check readings after meals  She is a good candidate for a GLP-1 drug, Januvia is likely not very effective Also not able to take metformin because of GI side effects and also renal dysfunction  Complications of diabetes:Peripheral neuropathy  CKD: Etiology unknown   PLAN:    Stop Wilder Glade as the creatinine clearance is below the level where it is effective.  She needs to significantly change her diet especially with her eating out and eating fast food.  Discussed with the patient the nature of GLP-1 drugs, the action on various organ systems, how they benefit blood glucose control, as well as the benefit of weight loss and  increase satiety . Explained possible side effects, particularly nausea and vomiting that usually resolve over time; discussed safety information in package insert. Demonstrated the medication injection device and injection technique to the patient.  Showed patient where to inject the medication. To start with TRULICITY  0.62 mg dosage weekly for the first 4 weeks  Patient  brochure on Trulicity with enclosed co-pay card given  Reduce her glipizide down to 1 tablet daily because of potential for hypoglycemia with renal dysfunction.  Also Stop Januvia since Trulicity will be more effective  Consider basal insulin if fasting readings continue to be high  Start monitoring blood sugars after meals alternating with fasting readings  Follow-up in 4 weeks  withLabs Including urine microalbumin   Consultation with dietitian   Patient Instructions  Eliminate all high-fat foods, use more baked foods and eliminate all high-fat meats, gravy and such  Check blood sugars on waking up  every other day  Also check blood sugars about 2 hours after a meal and do this after different meals by rotation  Recommended blood sugar levels on waking up is 90-130 and about 2 hours after meal is 130-160  Please bring your blood sugar monitor to each visit, thank you  STOP Wilder Glade and Januvia Reduce glipizide to 1 tablet daily  Start TRULICITYwith the pen as shown once weekly on the same day of the week.  You may inject in the stomach, thigh or arm as indicated in the brochure given.  You will feel fullness of the stomach with starting the medication and should try to keep the portions at meals small.  You may experience nausea in the first few days which usually gets better over time   If any questions or concerns are present call the office or the  [place] at [phone]. Also visit [email or web address] website for more useful information         Consultation note has been sent to the referring physician  [facility] 02/27/2017, 5:15 PM   Note: This office note was prepared with Dragon voice recognition system technology. Any transcriptional errors that result from this process are unintentional.

## 2017-02-27 NOTE — Patient Instructions (Signed)
Eliminate all high-fat foods, use more baked foods and eliminate all high-fat meats, gravy and such  Check blood sugars on waking up  every other day  Also check blood sugars about 2 hours after a meal and do this after different meals by rotation  Recommended blood sugar levels on waking up is 90-130 and about 2 hours after meal is 130-160  Please bring your blood sugar monitor to each visit, thank you  STOP Wilder Glade and Januvia Reduce glipizide to 1 tablet daily  Start TRULICITYwith the pen as shown once weekly on the same day of the week.  You may inject in the stomach, thigh or arm as indicated in the brochure given.  You will feel fullness of the stomach with starting the medication and should try to keep the portions at meals small.  You may experience nausea in the first few days which usually gets better over time   If any questions or concerns are present call the office or the  [place] at [phone]. Also visit [email or web address] website for more useful information

## 2017-03-11 ENCOUNTER — Ambulatory Visit: Payer: Medicare Other | Admitting: Dietician

## 2017-03-15 ENCOUNTER — Other Ambulatory Visit: Payer: Self-pay | Admitting: Family Medicine

## 2017-03-15 DIAGNOSIS — E1122 Type 2 diabetes mellitus with diabetic chronic kidney disease: Secondary | ICD-10-CM

## 2017-03-20 ENCOUNTER — Encounter: Payer: Self-pay | Admitting: Family Medicine

## 2017-03-20 DIAGNOSIS — IMO0002 Reserved for concepts with insufficient information to code with codable children: Secondary | ICD-10-CM

## 2017-03-20 DIAGNOSIS — E1165 Type 2 diabetes mellitus with hyperglycemia: Principal | ICD-10-CM

## 2017-03-20 DIAGNOSIS — E1151 Type 2 diabetes mellitus with diabetic peripheral angiopathy without gangrene: Secondary | ICD-10-CM

## 2017-03-20 MED ORDER — GLIPIZIDE ER 10 MG PO TB24: 10.0000 mg | ORAL_TABLET | Freq: Two times a day (BID) | ORAL | 1 refills | Status: DC

## 2017-03-25 ENCOUNTER — Encounter: Payer: Medicare Other | Attending: Endocrinology | Admitting: Dietician

## 2017-03-25 ENCOUNTER — Encounter: Payer: Self-pay | Admitting: Dietician

## 2017-03-25 DIAGNOSIS — E1121 Type 2 diabetes mellitus with diabetic nephropathy: Secondary | ICD-10-CM

## 2017-03-25 DIAGNOSIS — N183 Chronic kidney disease, stage 3 (moderate): Secondary | ICD-10-CM

## 2017-03-25 DIAGNOSIS — E1165 Type 2 diabetes mellitus with hyperglycemia: Secondary | ICD-10-CM | POA: Diagnosis not present

## 2017-03-25 DIAGNOSIS — N1832 Chronic kidney disease, stage 3b: Secondary | ICD-10-CM

## 2017-03-25 NOTE — Patient Instructions (Addendum)
  Begin to exercise most days.  Consider the armchair exercises or walking.  Start with 10 minutes and increase as able. Continue to check your blood sugar. Continue to take your medication. Avoid dark soda, choose diet gingerale or water instead. Avoid added salt. Do not use salt substitute or lite salt. Avoid processed meat. Choose fresh or frozen veges without salt or canned veges (no salt added). Bake or grill rather than fried. Choose only lean meat (limit portion size to 2-3 ounces). Read labels for sodium, fat, and carbohydrates.    Consider having your grandson use the Calorie [employer] Pace App on his phone to make better choices when eating out. Choose fruit and vegetables that are low in potassium.

## 2017-03-25 NOTE — Progress Notes (Signed)
Diabetes Self-Management Education  Visit Type: First/Initial  Appt. Start Time: 1530 Appt. End Time: 1540  03/25/2017  [name], identified by name and date of birth, is a 83 y.o. female with a diagnosis of Diabetes: Type 2. Other hx includes CKD, HTN, hyperlipidemia, depression and anxiety. Labs Noted (02/17/17):  GFR 33, BUN 56, Creatinine 1.59, Potassium 5.0  Patient's 65 yo grandson lives with patient.  He does not cook much but is willing to learn and helps her when needed.  He gets fast food for them.  Most meals are out to eat.  She has noted a difference in her blood sugar when she eats low fat compared when she eats richer food choices.  Her husband died in 2001-04-15.  She is a retired Theatre manager. ASSESSMENT  Height 5\' 9"  (1.753 m), weight 271 lb (122.9 kg). Body mass index is 40.02 kg/m.  Highest weight 286 lbs Lowest weight 100 lbs at 83yo Reduced to 160 lbs around age 74 with weight watchers and regained with immobility with broken ankle.      Diabetes Self-Management Education - 03/25/17 1549      Visit Information   Visit Type First/Initial     Initial Visit   Diabetes Type Type 2   Are you currently following a meal plan? No   Are you taking your medications as prescribed? Yes   Date Diagnosed ?1983     Health Coping   How would you rate your overall health? Good     Psychosocial Assessment   Patient Belief/Attitude about Diabetes Motivated to manage diabetes   Self-care barriers Unsteady gait/risk for falls   Self-management support Doctor's office;Family   Other persons present Patient   Patient Concerns Nutrition/Meal planning;Glycemic Control   Special Needs None   Preferred Learning Style No preference indicated   Learning Readiness Ready   How often do you need to have someone help you when you read instructions, pamphlets, or other written materials from your doctor or pharmacy? 1 - Never   What is the last grade level you  completed in school? 1 year college     Pre-Education Assessment   Patient understands the diabetes disease and treatment process. Needs Review   Patient understands incorporating nutritional management into lifestyle. Needs Review   Patient undertands incorporating physical activity into lifestyle. Needs Review   Patient understands using medications safely. Needs Review   Patient understands monitoring blood glucose, interpreting and using results Needs Review   Patient understands prevention, detection, and treatment of acute complications. Needs Review   Patient understands prevention, detection, and treatment of chronic complications. Needs Review   Patient understands how to develop strategies to address psychosocial issues. Needs Review   Patient understands how to develop strategies to promote health/change behavior. Needs Review     Complications   Last HgB A1C per patient/outside source 8.8 %  02/17/17   How often do you check your blood sugar? 1-2 times/day   Fasting Blood glucose range (mg/dL) 130-179;180-200;>200   Number of hypoglycemic episodes per month 0   Number of hyperglycemic episodes per week 5   Can you tell when your blood sugar is high? Yes   What do you do if your blood sugar is high? drinks water, avoids eating until it is down   Have you had a dilated eye exam in the past 12 months? Yes   Have you had a dental exam in the past 12 months? Yes   Are you checking  your feet? Yes   How many days per week are you checking your feet? 7     Dietary Intake   Breakfast special K with red berries, 2% milk OR 2 slices cheese toast on whole grain toast or cooked oatmeal, cottage cheese  6-8   Snack (morning) none   Lunch none or small amount peanuts   Snack (afternoon) canned fruit without sugar and cottage cheese   Dinner grilled chicken salad OR BBQ and 2 slaws, occasional hush puppies OR occasional hamburger OR 2 tacos  5   Snack (evening) occasional peanuts OR  buttermilk   Beverage(s) black coffee, diet pepsi, diet gingerale, water, unsweetened tea, buttermilk     Exercise   Exercise Type ADL's   How many days per week to you exercise? 0   How many minutes per day do you exercise? 0   Total minutes per week of exercise 0     Patient Education   Previous Diabetes Education Yes (please comment)   Disease state  Definition of diabetes, type 1 and 2, and the diagnosis of diabetes  when first diagnosed   Nutrition management  Role of diet in the treatment of diabetes and the relationship between the three main macronutrients and blood glucose level;Food label reading, portion sizes and measuring food.;Meal options for control of blood glucose level and chronic complications.;Information on hints to eating out and maintain blood glucose control.;Other (comment)  low sodium, low potassium, low phosphorous.   Physical activity and exercise  Role of exercise on diabetes management, blood pressure control and cardiac health.;Helped patient identify appropriate exercises in relation to his/her diabetes, diabetes complications and other health issue.   Monitoring Identified appropriate SMBG and/or A1C goals.;Purpose and frequency of SMBG.   Chronic complications Relationship between chronic complications and blood glucose control   Psychosocial adjustment Worked with patient to identify barriers to care and solutions;Role of stress on diabetes;Identified and addressed patients feelings and concerns about diabetes;Brainstormed with patient on coping mechanisms for social situations, getting support from significant others, dealing with feelings about diabetes   Personal strategies to promote health Lifestyle issues that need to be addressed for better diabetes care     Individualized Goals (developed by patient)   Nutrition General guidelines for healthy choices and portions discussed   Physical Activity Exercise 3-5 times per week;15 minutes per day    Medications take my medication as prescribed   Monitoring  test my blood glucose as discussed   Reducing Risk Other (comment)  low sodium diet, change eating out   Health Coping discuss diabetes with (comment)  family, MD/RD     Post-Education Assessment   Patient understands the diabetes disease and treatment process. Demonstrates understanding / competency   Patient understands incorporating nutritional management into lifestyle. Demonstrates understanding / competency   Patient undertands incorporating physical activity into lifestyle. Demonstrates understanding / competency   Patient understands using medications safely. Demonstrates understanding / competency   Patient understands monitoring blood glucose, interpreting and using results Demonstrates understanding / competency   Patient understands prevention, detection, and treatment of acute complications. Demonstrates understanding / competency   Patient understands prevention, detection, and treatment of chronic complications. Demonstrates understanding / competency   Patient understands how to develop strategies to address psychosocial issues. Demonstrates understanding / competency   Patient understands how to develop strategies to promote health/change behavior. Demonstrates understanding / competency     Outcomes   Expected Outcomes Demonstrated interest in learning. Expect positive outcomes  Future DMSE 4-6 wks   Program Status Completed      Individualized Plan for Diabetes Self-Management Training:   Learning Objective:  Patient will have a greater understanding of diabetes self-management. Patient education plan is to attend individual and/or group sessions per assessed needs and concerns.   Plan:   Patient Instructions    Begin to exercise most days.  Consider the armchair exercises or walking.  Start with 10 minutes and increase as able. Continue to check your blood sugar. Continue to take your  medication. Avoid dark soda, choose diet gingerale or water instead. Avoid added salt. Do not use salt substitute or lite salt. Avoid processed meat. Choose fresh or frozen veges without salt or canned veges (no salt added). Bake or grill rather than fried. Choose only lean meat (limit portion size to 2-3 ounces). Read labels for sodium, fat, and carbohydrates.    Consider having your grandson use the Calorie [employer] Pace App on his phone to make better choices when eating out. Choose fruit and vegetables that are low in potassium.     Expected Outcomes:  Demonstrated interest in learning. Expect positive outcomes  Education material provided: Living Well with Diabetes, Food label handouts and A1C conversion sheet, [facility] diet pyramid  If problems or questions, patient to contact team via:  Phone  Future DSME appointment: 4-6 wks

## 2017-03-27 ENCOUNTER — Other Ambulatory Visit (INDEPENDENT_AMBULATORY_CARE_PROVIDER_SITE_OTHER): Payer: Medicare Other

## 2017-03-27 DIAGNOSIS — E1165 Type 2 diabetes mellitus with hyperglycemia: Secondary | ICD-10-CM | POA: Diagnosis not present

## 2017-03-27 LAB — BASIC METABOLIC PANEL
BUN: 45 mg/dL — ABNORMAL HIGH (ref 6–23)
CALCIUM: 9.5 mg/dL (ref 8.4–10.5)
CO2: 23 mEq/L (ref 19–32)
Chloride: 103 mEq/L (ref 96–112)
Creatinine, Ser: 1.8 mg/dL — ABNORMAL HIGH (ref 0.40–1.20)
GFR: 28.75 mL/min — AB (ref >60.00)
Glucose, Bld: 210 mg/dL — ABNORMAL HIGH (ref 70–99)
Potassium: 4.9 mEq/L (ref 3.5–5.1)
SODIUM: 134 meq/L — AB (ref 135–145)

## 2017-03-28 LAB — FRUCTOSAMINE: FRUCTOSAMINE: 339 umol/L — AB (ref 0–285)

## 2017-04-01 ENCOUNTER — Encounter: Payer: Self-pay | Admitting: Endocrinology

## 2017-04-01 ENCOUNTER — Other Ambulatory Visit: Payer: Self-pay

## 2017-04-01 ENCOUNTER — Ambulatory Visit (INDEPENDENT_AMBULATORY_CARE_PROVIDER_SITE_OTHER): Payer: Medicare Other | Admitting: Endocrinology

## 2017-04-01 VITALS — BP 144/88 | HR 84 | Ht 67.0 in | Wt 272.0 lb

## 2017-04-01 DIAGNOSIS — N1832 Chronic kidney disease, stage 3b: Secondary | ICD-10-CM

## 2017-04-01 DIAGNOSIS — N183 Chronic kidney disease, stage 3 (moderate): Secondary | ICD-10-CM

## 2017-04-01 DIAGNOSIS — E1165 Type 2 diabetes mellitus with hyperglycemia: Secondary | ICD-10-CM

## 2017-04-01 MED ORDER — DULAGLUTIDE 1.5 MG/0.5ML ~~LOC~~ SOAJ: SUBCUTANEOUS | 1 refills | Status: DC

## 2017-04-01 MED ORDER — GLUCOSE BLOOD VI STRP: ORAL_STRIP | 3 refills | Status: DC

## 2017-04-01 NOTE — Progress Notes (Signed)
Patient ID: Katelyn Mahoney, female   DOB: 1934-10-05, 83 y.o.   MRN: [id number]            Reason for Appointment: Follow-up for Type 2 Diabetes  Referring physician: Etter Sjogren   History of Present Illness:          Date of diagnosis of type 2 diabetes mellitus: ?  1983        Background history:   She had previously been on metformin with fairly good control for some time She was also at some point treated with Janumet Apparently subsequently she could not tolerate metformin and had diarrhea This was switched to glipizide about 2 years ago and she is also being treated with Januvia and Chana Bode was started in 9/17  Review of A1c indicates that her levels have been significantly high since 2016  Recent history:       Non-insulin hypoglycemic drugs the patient is taking are: Trulicity 9.76 mg weekly, glipizide ER 10 mg once a day  Current management, blood sugar patterns and problems identified:  She was initially seen in consultation on 2/28  Her Wilder Glade was stopped because of renal dysfunction and Januvia changed to Trulicity 7.34 mg weekly  Her blood sugars appear to be relatively better than before but still overall high, previously mostly over 200  She is checking sugars mostly fasting  She did have a couple of readings below 150 in the last 2 weeks but even after her last injection of Trulicity her blood sugars were still going up to 150-170 range; she ran out of her Trulicity last Friday  She has seen the dietitian and is going to start making changes, previously diet had been some optimal especially with her not being able to prepare food for herself        Side effects from medications have been: Diarrhea from metformin  Compliance with the medical regimen: Fair Hypoglycemia:   none    Glucose monitoring:  done1  times a day         Glucometer: One Touch Verio       Blood Glucose readings by time of day  from download of meter :  PREMEAL Breakfast Lunch  Dinner Bedtime  Overall   Glucose range: 120-232  132-170     Median: 179        Self-care: The diet that the patient has been following is: none    Meal times are:  Breakfast is at 9-10 AM Typical meal intake: Breakfast is she is toast.  Lunch is variable or nothing.  Meals are usually chicken sandwich or barbecue, sometimes fast food, pasta.  Snacks: Peanut's, yogurt                Dietician visit, most recent: 3/17                Exercise:  None  Weight history:249-305  Wt Readings from Last 3 Encounters:  04/01/17 272 lb (123.4 kg)  03/25/17 271 lb (122.9 kg)  02/27/17 278 lb (126.1 kg)    Glycemic control:   Lab Results  Component Value Date   HGBA1C 8.8 (H) 02/07/2017   HGBA1C 8.9 (H) 10/02/2016   HGBA1C 9.4 (H) 04/02/2016   Lab Results  Component Value Date   MICROALBUR 2.3 (H) 04/02/2016   LDLCALC 91 02/07/2017   CREATININE 1.80 (H) 03/27/2017   Lab Results  Component Value Date   MICRALBCREAT 4.6 04/02/2016       Allergies as of  04/01/2017      Reactions   Bupropion Hcl    Palmar rash   Penicillins    rash   Amlodipine Besylate    Cough    Citalopram Hydrobromide Other (See Comments)   Unknown   Metformin And Related Diarrhea      Medication List       Accurate as of 04/01/17  9:01 AM. Always use your most recent med list.          aspirin 81 MG tablet Take 81 mg by mouth. 3 by mouth in the evening   atorvastatin 20 MG tablet Commonly known as:  LIPITOR TAKE 1/2 TABLET BY MOUTH MONDAY, WEDNESDAY & FRIDAY AND SUNDAY   BILBERRY PO Take 470 mg by mouth. 2 by mouth daily   CLARITIN 10 MG Caps Generic drug:  Loratadine Take 1 capsule by mouth daily as needed.   Dulaglutide 0.75 MG/0.5ML Sopn Commonly known as:  TRULICITY Inject in the abdominal skin as directed once a week   FLECTOR 1.3 % Ptch Generic drug:  diclofenac APPLY 1 PATCH ONTO THE SKIN TWICE A DAY   fluocinonide cream 0.05 % Commonly known as:  LIDEX Apply 1 application  topically 2 (two) times daily.   furosemide 20 MG tablet Commonly known as:  LASIX Take 1 tablet (20 mg total) by mouth daily.   furosemide 20 MG tablet Commonly known as:  LASIX TAKE 1 TABLET DAILY   glipiZIDE 10 MG 24 hr tablet Commonly known as:  GLUCOTROL XL Take 1 tablet (10 mg total) by mouth 2 (two) times daily.   GLUCOSAMINE PO Take by mouth daily.   glucose blood test strip Commonly known as:  ONETOUCH VERIO Use as instructed once a day.  DX: E11.21   JANUVIA 100 MG tablet Generic drug:  sitaGLIPtin TAKE 1 TABLET DAILY   losartan 100 MG tablet Commonly known as:  COZAAR Take 1 tablet (100 mg total) by mouth daily.   metoprolol 100 MG tablet Commonly known as:  LOPRESSOR Take 1 tablet (100 mg total) by mouth 2 (two) times daily.   multivitamin-lutein Caps capsule Take 1 capsule by mouth daily.   HAIR/SKIN/NAILS Tabs TAKE 1 TABLET DAILY   nystatin ointment Commonly known as:  MYCOSTATIN Apply 1 application topically 2 (two) times daily.   ONETOUCH DELICA LANCETS 56L Misc Use to test blood sugar once daily- E11.21   sertraline 100 MG tablet Commonly known as:  ZOLOFT Take 1 tablet (100 mg total) by mouth daily.   TYLENOL ARTHRITIS PAIN 650 MG CR tablet Generic drug:  acetaminophen 2 by mouth once daily as needed       Allergies:  Allergies  Allergen Reactions  . Bupropion Hcl     Palmar rash  . Penicillins     rash  . Amlodipine Besylate     Cough   . Citalopram Hydrobromide Other (See Comments)    Unknown  . Metformin And Related Diarrhea    Past Medical History:  Diagnosis Date  . Allergy   . Anxiety   . Arthritis   . Arthritis   . Callus   . Depression   . Diabetes mellitus without complication ([place])   . Hyperlipidemia   . Hypertension     Past Surgical History:  Procedure Laterality Date  . ABDOMINAL HYSTERECTOMY    . CHOLECYSTECTOMY    . SPLENECTOMY      Family History  Problem Relation Age of Onset  . Cancer  Mother   .  Cancer Father   . Cancer Sister   . Cancer Brother     Social History:  reports that she has never smoked. She has never used smokeless tobacco. She reports that she does not drink alcohol or use drugs.   Review of Systems   Lipid history: These are controlled with 20 mg Lipitor    Lab Results  Component Value Date   CHOL 187 02/07/2017   HDL 56.30 02/07/2017   LDLCALC 91 02/07/2017   LDLDIRECT 78.3 04/16/2013   TRIG 197.0 (H) 02/07/2017   CHOLHDL 3 02/07/2017           Hypertension: Currently on 50 mg LosartanAnd metoprolol  Most recent eye exam was In 8/17, reportedly no retinopathy  Most recent foot exam:01/2017 Also followed by podiatrist  CKD: Not clear why she has renal dysfunction This is followed by nephrologist and is due to see them in 5/18   Lab Results  Component Value Date   CREATININE 1.80 (H) 03/27/2017   CREATININE 1.59 (H) 02/07/2017   CREATININE 1.49 (H) 10/02/2016   CREATININE 1.53 (H) 04/02/2016      LABS:  Lab on 03/27/2017  Component Date Value Ref Range Status  . Sodium 03/27/2017 134* 135 - 145 mEq/L Final  . Potassium 03/27/2017 4.9  3.5 - 5.1 mEq/L Final  . Chloride 03/27/2017 103  96 - 112 mEq/L Final  . CO2 03/27/2017 23  19 - 32 mEq/L Final  . Glucose, Bld 03/27/2017 210* 70 - 99 mg/dL Final  . BUN 03/27/2017 45* 6 - 23 mg/dL Final  . Creatinine, Ser 03/27/2017 1.80* 0.40 - 1.20 mg/dL Final  . Calcium 03/27/2017 9.5  8.4 - 10.5 mg/dL Final  . GFR 03/27/2017 28.75* >60.00 mL/min Final  . Fructosamine 03/27/2017 339* 0 - 285 umol/L Final   Comment: Published reference interval for apparently healthy subjects between age 32 and 69 is 63 - 285 umol/L and in a poorly controlled diabetic population is 228 - 563 umol/L with a mean of 396 umol/L.     Physical Examination:  BP (!) 144/88   Pulse 84   Ht 5\' 7"  (1.702 m)   Wt 272 lb (123.4 kg)   BMI 42.60 kg/m         ASSESSMENT:  Diabetes type 2,  uncontrolled With BMI 45   See history of present illness for detailed discussion of current diabetes management, blood sugar patterns and problems identified  She has had long-standing diabetes which has progressed over the last 2 years at least and A1c is mostly over 8%  She is now on Trulicity and glipizide Although her blood sugars are relatively better than before there is still not well controlled as discussed above She has seen the dietitian and is supposed to make some changes in her meal planning which would also help her control and assist and weight loss; previously has had a tendency to weight gain  CKD: Etiology unknown and creatinine is relatively higher   PLAN:    She will increase the Trulicity up to 1.5 mg and she is tolerating the 0.75 mg well  Discussed blood sugar targets  Consider basal insulin if fasting readings continue to be high  Start monitoring blood sugars after meals alternating with fasting readings  Follow-up in 6 weeks  Also follow-up with nephrologist  Follow-up Consultation with dietitian   There are no Patient Instructions on file for this visit.    Katelyn Mahoney 04/01/2017, 9:01 AM   Note: This office  note was prepared with Estate agent. Any transcriptional errors that result from this process are unintentional.

## 2017-04-01 NOTE — Patient Instructions (Signed)
Check blood sugars on waking up  3x weekly  Also check blood sugars about 2 hours after a meal and do this after different meals by rotation  Recommended blood sugar levels on waking up is 90-130 and about 2 hours after meal is 130-160  Please bring your blood sugar monitor to each visit, thank you

## 2017-04-27 ENCOUNTER — Other Ambulatory Visit: Payer: Self-pay | Admitting: Family Medicine

## 2017-04-29 ENCOUNTER — Encounter: Payer: Medicare Other | Attending: Endocrinology | Admitting: Dietician

## 2017-04-29 ENCOUNTER — Other Ambulatory Visit: Payer: Self-pay | Admitting: Family Medicine

## 2017-04-29 DIAGNOSIS — E1165 Type 2 diabetes mellitus with hyperglycemia: Secondary | ICD-10-CM | POA: Diagnosis present

## 2017-04-29 DIAGNOSIS — E1121 Type 2 diabetes mellitus with diabetic nephropathy: Secondary | ICD-10-CM

## 2017-04-29 DIAGNOSIS — I10 Essential (primary) hypertension: Secondary | ICD-10-CM

## 2017-04-29 NOTE — Patient Instructions (Signed)
Continue to changes that you have made! Consider the low sodium diet. Aim for Breakfast, lunch, and dinner daily.

## 2017-04-29 NOTE — Progress Notes (Signed)
Diabetes Self-Management Education  Visit Type:  Follow-up  Appt. Start Time: 1430 Appt. End Time: 1500  04/29/2017  Katelyn Mahoney, identified by name and date of birth, is a 83 y.o. female with a diagnosis of Diabetes: Type 2.    Patient's 36 yo grandson lives with patient.  He is doing the shopping and she is now teaching him how to cook and he is preparing most of the meals.  She notes differences in her blood sugar depending on what she eats.  Her husband died in Apr 14, 2001.  She is a retired Theatre manager.  ASSESSMENT  Weight:  264 lbs today.  She has lost 7 lbs since her last visit 1 month ago.  She is now following a lower sodium diet.  Her blood sugar readings have decreased from one month ago and are within range most of the time.         Diabetes Self-Management Education - 04/29/17 1726      Psychosocial Assessment   Patient Belief/Attitude about Diabetes Motivated to manage diabetes   Self-care barriers Unable to determine   Learning Readiness Ready     Pre-Education Assessment   Patient understands the diabetes disease and treatment process. Needs Review   Patient understands incorporating nutritional management into lifestyle. Needs Review   Patient undertands incorporating physical activity into lifestyle. Needs Review   Patient understands using medications safely. Needs Review   Patient understands monitoring blood glucose, interpreting and using results Needs Review   Patient understands prevention, detection, and treatment of acute complications. Needs Review   Patient understands prevention, detection, and treatment of chronic complications. Needs Review   Patient understands how to develop strategies to address psychosocial issues. Needs Review   Patient understands how to develop strategies to promote health/change behavior. Needs Review     Complications   Fasting Blood glucose range (mg/dL) 70-129   Postprandial Blood glucose range (mg/dL)  130-179   Number of hypoglycemic episodes per month 0   Number of hyperglycemic episodes per week 0     Dietary Intake   Breakfast Special K with red berries or plain cheerios, 2% milk OR 2 slices cheese toast   Snack (morning) none   Lunch often skips   Health visitor, green beans or broccoli or cabbage or salad and occasional potato OR roast beef or pork with peas and other vegetable  4-5   Snack (evening) yogurt and unsalted peanuts   Beverage(s) black coffee (10 cups), occasional diet gingerale, water (36 ounces daily), occasional unsweetened decaf tea     Exercise   Exercise Type ADL's   How many days per week to you exercise? 0   How many minutes per day do you exercise? 0   Total minutes per week of exercise 0     Patient Education   Nutrition management  Meal options for control of blood glucose level and chronic complications.   Psychosocial adjustment Worked with patient to identify barriers to care and solutions     Patient Self-Evaluation of Goals - Patient rates self as meeting previously set goals (% of time)   Nutrition >75%   Physical Activity >75%   Medications >75%   Monitoring >75%   Problem Solving 50 - 75 %   Reducing Risk 50 - 75 %   Health Coping 50 - 75 %     Post-Education Assessment   Patient understands the diabetes disease and treatment process. Demonstrates understanding / competency   Patient understands incorporating  nutritional management into lifestyle. Demonstrates understanding / competency   Patient undertands incorporating physical activity into lifestyle. Demonstrates understanding / competency   Patient understands using medications safely. Demonstrates understanding / competency   Patient understands monitoring blood glucose, interpreting and using results Demonstrates understanding / competency   Patient understands prevention, detection, and treatment of acute complications. Demonstrates understanding / competency   Patient  understands prevention, detection, and treatment of chronic complications. Demonstrates understanding / competency   Patient understands how to develop strategies to address psychosocial issues. Demonstrates understanding / competency   Patient understands how to develop strategies to promote health/change behavior. Demonstrates understanding / competency     Outcomes   Program Status Completed     Subsequent Visit   Since your last visit, are you checking your blood glucose at least once a day? Yes      Learning Objective:  Patient will have a greater understanding of diabetes self-management. Patient education plan is to attend individual and/or group sessions per assessed needs and concerns.   Plan:   Patient Instructions  Continue to changes that you have made! Consider the low sodium diet. Aim for Breakfast, lunch, and dinner daily.    Expected Outcomes:  Demonstrated interest in learning. Expect positive outcomes  Education material provided:none at this visit  If problems or questions, patient to contact team via:  Phone  Future DSME appointment: - PRN

## 2017-05-13 ENCOUNTER — Other Ambulatory Visit (INDEPENDENT_AMBULATORY_CARE_PROVIDER_SITE_OTHER): Payer: Medicare Other

## 2017-05-13 DIAGNOSIS — E1165 Type 2 diabetes mellitus with hyperglycemia: Secondary | ICD-10-CM | POA: Diagnosis not present

## 2017-05-13 LAB — HEMOGLOBIN A1C: HEMOGLOBIN A1C: 8.1 % — AB (ref 4.6–6.5)

## 2017-05-13 LAB — BASIC METABOLIC PANEL
BUN: 36 mg/dL — AB (ref 6–23)
CALCIUM: 9.7 mg/dL (ref 8.4–10.5)
CO2: 22 meq/L (ref 19–32)
CREATININE: 1.41 mg/dL — AB (ref 0.40–1.20)
Chloride: 103 mEq/L (ref 96–112)
GFR: 38.1 mL/min — ABNORMAL LOW (ref >60.00)
GLUCOSE: 229 mg/dL — AB (ref 70–99)
Potassium: 5.7 mEq/L — ABNORMAL HIGH (ref 3.5–5.1)
Sodium: 133 mEq/L — ABNORMAL LOW (ref 135–145)

## 2017-05-20 ENCOUNTER — Encounter: Payer: Self-pay | Admitting: Endocrinology

## 2017-05-20 ENCOUNTER — Other Ambulatory Visit: Payer: Self-pay

## 2017-05-20 ENCOUNTER — Ambulatory Visit (INDEPENDENT_AMBULATORY_CARE_PROVIDER_SITE_OTHER): Payer: Medicare Other | Admitting: Endocrinology

## 2017-05-20 VITALS — BP 132/82 | HR 82 | Ht 63.75 in | Wt 265.0 lb

## 2017-05-20 DIAGNOSIS — E1165 Type 2 diabetes mellitus with hyperglycemia: Secondary | ICD-10-CM

## 2017-05-20 DIAGNOSIS — E875 Hyperkalemia: Secondary | ICD-10-CM | POA: Diagnosis not present

## 2017-05-20 MED ORDER — INSULIN GLARGINE 300 UNIT/ML ~~LOC~~ SOPN: 10.0000 [IU] | PEN_INJECTOR | Freq: Every day | SUBCUTANEOUS | 3 refills | Status: DC

## 2017-05-20 MED ORDER — AMLODIPINE BESYLATE 5 MG PO TABS: 5.0000 mg | ORAL_TABLET | Freq: Every day | ORAL | 3 refills | Status: DC

## 2017-05-20 MED ORDER — DULAGLUTIDE 1.5 MG/0.5ML ~~LOC~~ SOAJ: SUBCUTANEOUS | 1 refills | Status: DC

## 2017-05-20 MED ORDER — INSULIN PEN NEEDLE 29G X 5MM MISC: 2 refills | Status: DC

## 2017-05-20 MED ORDER — GLUCOSE BLOOD VI STRP: ORAL_STRIP | 3 refills | Status: DC

## 2017-05-20 NOTE — Progress Notes (Signed)
Patient ID: Katelyn Mahoney, female   DOB: July 01, 1934, 83 y.o.   MRN: [id number]            Reason for Appointment: Follow-up for Type 2 Diabetes  Referring physician: Etter Sjogren   History of Present Illness:          Date of diagnosis of type 2 diabetes mellitus: ?  1983        Background history:   She had previously been on metformin with fairly good control for some time She was also at some point treated with Janumet Apparently subsequently she could not tolerate metformin and had diarrhea This was switched to glipizide about 2 years ago and she is also being treated with Januvia and Chana Bode was started in 9/17  Review of A1c indicates that her levels have been significantly high since 2016 She was initially seen in consultation on 02/27/17  Recent history:       Non-insulin hypoglycemic drugs the patient is taking are: Trulicity 1.5 mg weekly, glipizide ER 10 mg 2x a day  Current management, blood sugar patterns and problems identified:  Her previous blood sugars were running overall about 180 fasting and she seems to have overall better readings in the morning with increasing her Trulicity  However she has not checked her blood sugars in the last 9 days and prior to this her fasting readings for a few days were as high as 180  Also she seems to have consistently high readings POSTPRANDIALLY at night except once with blood sugars as high as 246, has not done any readings in the evenings since 05/06/17  She does appear to have lost some weight, partly from changing her diet after seeing the dietitian.  LAB glucose was 229 fasting about a week ago  She is fairly consistent with taking her glipizide and Trulicity and has no side effects       Side effects from medications have been: Diarrhea from metformin  Compliance with the medical regimen: Fair Hypoglycemia:   none    Glucose monitoring:  done 1  times a day         Glucometer: One Touch Verio       Blood  Glucose readings by time of day  from download of meter :   Mean values apply above for all meters except median for One Touch  PRE-MEAL Fasting Lunch Dinner Bedtime Overall  Glucose range: 117-180    1 34-246    Mean/median: 142    189  161     Self-care:  Meal times are:  Breakfast is at 9-10 AM Typical meal intake: Breakfast is Usually a toast.  Lunch is variable or nothing.  Meals are usually chicken sandwich or barbecue, sometimes fast food, pasta.  Snacks: Peanut's, yogurt                Dietician visit, most recent: 3/17                Exercise:  None, not able to do much  Weight history:249-305  Wt Readings from Last 3 Encounters:  05/20/17 265 lb (120.2 kg)  04/01/17 272 lb (123.4 kg)  03/25/17 271 lb (122.9 kg)    Glycemic control:   Lab Results  Component Value Date   HGBA1C 8.1 (H) 05/13/2017   HGBA1C 8.8 (H) 02/07/2017   HGBA1C 8.9 (H) 10/02/2016   Lab Results  Component Value Date   MICROALBUR 2.3 (H) 04/02/2016   Dietrich 91 02/07/2017  CREATININE 1.41 (H) 05/13/2017   Lab Results  Component Value Date   MICRALBCREAT 4.6 04/02/2016       Allergies as of 05/20/2017      Reactions   Bupropion Hcl    Palmar rash   Penicillins    rash   Citalopram Hydrobromide Other (See Comments)   Unknown   Metformin And Related Diarrhea      Medication List       Accurate as of 05/20/17  5:09 PM. Always use your most recent med list.          amLODipine 5 MG tablet Commonly known as:  NORVASC Take 1 tablet (5 mg total) by mouth daily.   aspirin 81 MG tablet Take 81 mg by mouth. 3 by mouth in the evening   atorvastatin 20 MG tablet Commonly known as:  LIPITOR TAKE 1/2 TABLET BY MOUTH MONDAY, WEDNESDAY & FRIDAY AND SUNDAY   BILBERRY PO Take 470 mg by mouth. 2 by mouth daily   CLARITIN 10 MG Caps Generic drug:  Loratadine Take 1 capsule by mouth daily as needed.   Dulaglutide 1.5 MG/0.5ML Sopn Commonly known as:  TRULICITY Inject weekly     FLECTOR 1.3 % Ptch Generic drug:  diclofenac APPLY 1 PATCH ONTO THE SKIN TWICE A DAY   fluocinonide cream 0.05 % Commonly known as:  LIDEX Apply 1 application topically 2 (two) times daily.   furosemide 20 MG tablet Commonly known as:  LASIX Take 1 tablet (20 mg total) by mouth daily.   glipiZIDE 10 MG 24 hr tablet Commonly known as:  GLUCOTROL XL Take 1 tablet (10 mg total) by mouth 2 (two) times daily.   GLUCOSAMINE PO Take by mouth daily.   glucose blood test strip Commonly known as:  ONETOUCH VERIO Use to test blood sugar twice a day.  DX: E11.21   Insulin Glargine 300 UNIT/ML Sopn Commonly known as:  TOUJEO SOLOSTAR Inject 10 Units into the skin daily.   Insulin Pen Needle 29G X 5MM Misc Use to inject insulin into skin 1-3 times daily   losartan 100 MG tablet Commonly known as:  COZAAR TAKE 1 TABLET DAILY   metoprolol tartrate 100 MG tablet Commonly known as:  LOPRESSOR Take 1 tablet (100 mg total) by mouth 2 (two) times daily.   multivitamin-lutein Caps capsule Take 1 capsule by mouth daily.   HAIR/SKIN/NAILS Tabs TAKE 1 TABLET DAILY   nystatin ointment Commonly known as:  MYCOSTATIN Apply 1 application topically 2 (two) times daily.   ONETOUCH DELICA LANCETS 21H Misc Use to test blood sugar once daily- E11.21   sertraline 100 MG tablet Commonly known as:  ZOLOFT Take 1 tablet (100 mg total) by mouth daily.   TYLENOL ARTHRITIS PAIN 650 MG CR tablet Generic drug:  acetaminophen 2 by mouth once daily as needed       Allergies:  Allergies  Allergen Reactions  . Bupropion Hcl     Palmar rash  . Penicillins     rash  . Citalopram Hydrobromide Other (See Comments)    Unknown  . Metformin And Related Diarrhea    Past Medical History:  Diagnosis Date  . Allergy   . Anxiety   . Arthritis   . Arthritis   . Callus   . Depression   . Diabetes mellitus without complication ([place])   . Hyperlipidemia   . Hypertension     Past Surgical  History:  Procedure Laterality Date  . ABDOMINAL HYSTERECTOMY    .  CHOLECYSTECTOMY    . SPLENECTOMY      Family History  Problem Relation Age of Onset  . Cancer Mother   . Cancer Father   . Cancer Sister   . Cancer Brother     Social History:  reports that she has never smoked. She has never used smokeless tobacco. She reports that she does not drink alcohol or use drugs.   Review of Systems   Lipid history: LDL controlled with 20 mg Lipitor    Lab Results  Component Value Date   CHOL 187 02/07/2017   HDL 56.30 02/07/2017   LDLCALC 91 02/07/2017   LDLDIRECT 78.3 04/16/2013   TRIG 197.0 (H) 02/07/2017   CHOLHDL 3 02/07/2017           Hypertension: Currently on 50 mg Losartan And metoprolol Previously had taken amlodipine but she thinks this did not cause any significant cough which is listed as a reaction  Most recent eye exam was In 8/17, reportedly no retinopathy  Most recent foot exam:01/2017 Also followed by podiatrist  CKD: Not clear why she has renal dysfunction This is followed by nephrologist and is due to see him next month HYPERKALEMIA: This may have been related to losartan and she was told to reduce her losartan to half tablet by nephrologist previously However her potassium is significantly higher recently at 5.7, labs have been forwarded to PCP and nephrologist  Lab Results  Component Value Date   CREATININE 1.41 (H) 05/13/2017   CREATININE 1.80 (H) 03/27/2017   CREATININE 1.59 (H) 02/07/2017   CREATININE 1.49 (H) 10/02/2016      LABS:  No visits with results within 1 Week(s) from this visit.  Latest known visit with results is:  Lab on 05/13/2017  Component Date Value Ref Range Status  . Hgb A1c MFr Bld 05/13/2017 8.1* 4.6 - 6.5 % Final   Glycemic Control Guidelines for People with Diabetes:Non Diabetic:  <6%Goal of Therapy: <7%Additional Action Suggested:  >8%   . Sodium 05/13/2017 133* 135 - 145 mEq/L Final  . Potassium 05/13/2017 5.7*  3.5 - 5.1 mEq/L Final  . Chloride 05/13/2017 103  96 - 112 mEq/L Final  . CO2 05/13/2017 22  19 - 32 mEq/L Final  . Glucose, Bld 05/13/2017 229* 70 - 99 mg/dL Final  . BUN 05/13/2017 36* 6 - 23 mg/dL Final  . Creatinine, Ser 05/13/2017 1.41* 0.40 - 1.20 mg/dL Final  . Calcium 05/13/2017 9.7  8.4 - 10.5 mg/dL Final  . GFR 05/13/2017 38.10* >60.00 mL/min Final    Physical Examination:  BP 132/82   Pulse 82   Ht 5' 3.75" (1.619 m)   Wt 265 lb (120.2 kg)   SpO2 96%   BMI 45.84 kg/m        No ankle edema present  ASSESSMENT:  Diabetes type 2, uncontrolled With BMI 45   See history of present illness for detailed discussion of current diabetes management, blood sugar patterns and problems identified  She has had long-standing diabetes With her last A1c around 8% and not much better with adding Trulicity She has lost a little weight However her blood sugars are fairly consistently high after her evening meal and also periodically in the mornings despite her weight loss She thinks she is trying to watch her portions and diet but cannot expend why her fasting readings are higher again the last 5 times she has monitored them Not a candidate for SGLT 2 drugs because of renal dysfunction  CKD:  Etiology unknown and creatinine is somewhat better, does need follow-up with nephrologist  HYPERKALEMIA: This is probably related to continued use of losartan, renal dysfunction and aldosterone deficiency   PLAN:    She will start using basal insulin with Toujeo  Discussed in detail what basal insulin dose, how to use the insulin, where to do the injection, timing of injection, detailed instructions with a flowsheet on how to titrate the dose based on fasting blood sugars every 3 days  She will start with 6 units for now  No change in Trulicity or glipizide  Start monitoring blood sugars again regularly either fasting or after evening meal at least once a day, preferably  twice  HYPERTENSION: She will stop her losartan for now until she sees her nephrologist because of hyperkalemia Since she thinks she does not have any significant side effects from taking amlodipine in the past she will try this again at 5 mg daily until seen by nephrologist or PCP She will monitor blood pressure regularly at home as she is doing currently    Patient Instructions   Toujeo insulin: This insulin provides blood sugar control for up to 24 hours.   Start with 6 units at bedtime daily and increase by 2 units every 3 days until the waking up sugars are under 130. Then continue the same dose.   If blood sugar is under 90 for 2 days in a row, reduce the dose by 2 units.  Note that this insulin does not control the rise of blood sugar with meals    Stop Losartan   Total visit time for evaluation and management of multiple problems, communications with other providers, counseling, instructions and medication review = 25 minutes   Dillan Candela 05/20/2017, 5:09 PM   Note: This office note was prepared with Dragon voice recognition system technology. Any transcriptional errors that result from this process are unintentional.

## 2017-05-20 NOTE — Patient Instructions (Addendum)
Toujeo insulin: This insulin provides blood sugar control for up to 24 hours.   Start with 6 units at bedtime daily and increase by 2 units every 3 days until the waking up sugars are under 130. Then continue the same dose.   If blood sugar is under 90 for 2 days in a row, reduce the dose by 2 units.  Note that this insulin does not control the rise of blood sugar with meals    Stop Losartan

## 2017-06-04 ENCOUNTER — Other Ambulatory Visit: Payer: Self-pay | Admitting: Family Medicine

## 2017-06-04 DIAGNOSIS — R600 Localized edema: Secondary | ICD-10-CM

## 2017-06-20 ENCOUNTER — Other Ambulatory Visit (INDEPENDENT_AMBULATORY_CARE_PROVIDER_SITE_OTHER): Payer: Medicare Other

## 2017-06-20 DIAGNOSIS — E1165 Type 2 diabetes mellitus with hyperglycemia: Secondary | ICD-10-CM

## 2017-06-20 LAB — BASIC METABOLIC PANEL
BUN: 42 mg/dL — ABNORMAL HIGH (ref 6–23)
CO2: 24 mEq/L (ref 19–32)
Calcium: 9.6 mg/dL (ref 8.4–10.5)
Chloride: 101 mEq/L (ref 96–112)
Creatinine, Ser: 1.49 mg/dL — ABNORMAL HIGH (ref 0.40–1.20)
GFR: 35.74 mL/min — AB (ref >60.00)
Glucose, Bld: 191 mg/dL — ABNORMAL HIGH (ref 70–99)
Potassium: 5 mEq/L (ref 3.5–5.1)
SODIUM: 132 meq/L — AB (ref 135–145)

## 2017-06-20 LAB — MICROALBUMIN / CREATININE URINE RATIO
CREATININE, U: 84.6 mg/dL
MICROALB UR: 2.1 mg/dL — AB (ref 0.0–1.9)
MICROALB/CREAT RATIO: 2.5 mg/g (ref 0.0–30.0)

## 2017-06-21 LAB — FRUCTOSAMINE: Fructosamine: 312 umol/L — ABNORMAL HIGH (ref 0–285)

## 2017-06-24 ENCOUNTER — Ambulatory Visit (INDEPENDENT_AMBULATORY_CARE_PROVIDER_SITE_OTHER): Payer: Medicare Other | Admitting: Endocrinology

## 2017-06-24 ENCOUNTER — Encounter: Payer: Self-pay | Admitting: Endocrinology

## 2017-06-24 VITALS — BP 114/74 | HR 83 | Ht 63.75 in | Wt 263.8 lb

## 2017-06-24 DIAGNOSIS — E1165 Type 2 diabetes mellitus with hyperglycemia: Secondary | ICD-10-CM | POA: Diagnosis not present

## 2017-06-24 NOTE — Patient Instructions (Signed)
Keep am sugar <140  More sugars after supper

## 2017-06-24 NOTE — Progress Notes (Signed)
Patient ID: Katelyn Mahoney, female   DOB: 1934-07-25, 83 y.o.   MRN: [id number]            Reason for Appointment: Follow-up for Type 2 Diabetes  Referring physician: Etter Sjogren   History of Present Illness:          Date of diagnosis of type 2 diabetes mellitus: ?  1983        Background history:   She had previously been on metformin with fairly good control for some time She was also at some point treated with Janumet Apparently subsequently she could not tolerate metformin and had diarrhea This was switched to glipizide about 2 years ago and she is also being treated with Januvia and Chana Bode was started in 9/17  Review of A1c indicates that her levels have been significantly high since 2016 She was initially seen in consultation on 02/27/17  Recent history:       Non-insulin hypoglycemic drugs the patient is taking are: Trulicity 1.5 mg weekly, glipizide ER 10 mg 2x a day  INSULIN regimen: Toujeo 26 units at bedtime  Current management, blood sugar patterns and problems identified:  Because of blood sugars as high as 229 she was started on TOUJEO on her last visit in 5/18  She was told to titrate this based on fasting blood sugars and she has continued to do so from her starting dose of 6 units  Most recently her blood sugars are around 148 and she just went up to 26 units insulin  She also has missed 1 dose of Trulicity 2 weeks ago which may have caused some high readings  She has however done very well with cutting back on carbohydrates and high-fat foods and is losing weight even with her blood sugars improving and taking insulin  She has significantly cut back on most carbohydrates like bread and potatoes but does eat cereal in the morning and does not check readings after eating  She is checking blood sugars only sporadically later in the day and mostly around suppertime on these are similar to the morning readings, highest reading of 240 after supper  Not  able to do much physical activity again        Side effects from medications have been: Diarrhea from metformin  Compliance with the medical regimen: Fair Hypoglycemia:   none   Glucose monitoring:  done 1  times a day         Glucometer: One Touch Verio       Blood Glucose readings by time of day  from download of meter :   Mean values apply above for all meters except median for One Touch  PRE-MEAL Fasting Lunch Dinner Bedtime Overall  Glucose range:  124-216   101-203  240    Mean/median: 163    166     Self-care:  Meal times are:  Breakfast is at 9-10 AM Typical meal intake: Breakfast is Usually cereal.  Lunch is variable or nothing.  Meals are usually chicken, salad, fish.  Snacks: Peanuts, yogurt                Dietician visit, most recent: 3/17                Exercise:  None, not able to do muchPhysical activity  Weight history:249-305  Wt Readings from Last 3 Encounters:  06/24/17 263 lb 12.8 oz (119.7 kg)  05/20/17 265 lb (120.2 kg)  04/01/17 272 lb (123.4 kg)  Glycemic control:   Lab Results  Component Value Date   HGBA1C 8.1 (H) 05/13/2017   HGBA1C 8.8 (H) 02/07/2017   HGBA1C 8.9 (H) 10/02/2016   Lab Results  Component Value Date   MICROALBUR 2.1 (H) 06/20/2017   LDLCALC 91 02/07/2017   CREATININE 1.49 (H) 06/20/2017   Lab Results  Component Value Date   MICRALBCREAT 2.5 06/20/2017   Lab Results  Component Value Date   FRUCTOSAMINE 312 (H) 06/20/2017   FRUCTOSAMINE 339 (H) 03/27/2017       Allergies as of 06/24/2017      Reactions   Bupropion Hcl    Palmar rash   Penicillins    rash   Citalopram Hydrobromide Other (See Comments)   Unknown   Metformin And Related Diarrhea      Medication List       Accurate as of 06/24/17 10:59 AM. Always use your most recent med list.          amLODipine 5 MG tablet Commonly known as:  NORVASC Take 1 tablet (5 mg total) by mouth daily.   aspirin 81 MG tablet Take 81 mg by mouth. 3 by  mouth in the evening   atorvastatin 20 MG tablet Commonly known as:  LIPITOR TAKE 1/2 TABLET BY MOUTH MONDAY, WEDNESDAY & FRIDAY AND SUNDAY   BILBERRY PO Take 470 mg by mouth. 2 by mouth daily   CLARITIN 10 MG Caps Generic drug:  Loratadine Take 1 capsule by mouth daily as needed.   Dulaglutide 1.5 MG/0.5ML Sopn Commonly known as:  TRULICITY Inject weekly   FLECTOR 1.3 % Ptch Generic drug:  diclofenac APPLY 1 PATCH ONTO THE SKIN TWICE A DAY   fluocinonide cream 0.05 % Commonly known as:  LIDEX Apply 1 application topically 2 (two) times daily.   furosemide 20 MG tablet Commonly known as:  LASIX Take 1 tablet (20 mg total) by mouth daily.   furosemide 20 MG tablet Commonly known as:  LASIX TAKE 1 TABLET DAILY   glipiZIDE 10 MG 24 hr tablet Commonly known as:  GLUCOTROL XL Take 1 tablet (10 mg total) by mouth 2 (two) times daily.   GLUCOSAMINE PO Take by mouth daily.   glucose blood test strip Commonly known as:  ONETOUCH VERIO Use to test blood sugar twice a day.  DX: E11.21   Insulin Glargine 300 UNIT/ML Sopn Commonly known as:  TOUJEO SOLOSTAR Inject 10 Units into the skin daily.   Insulin Pen Needle 29G X 5MM Misc Use to inject insulin into skin 1-3 times daily   losartan 100 MG tablet Commonly known as:  COZAAR TAKE 1 TABLET DAILY   metoprolol tartrate 100 MG tablet Commonly known as:  LOPRESSOR Take 1 tablet (100 mg total) by mouth 2 (two) times daily.   multivitamin-lutein Caps capsule Take 1 capsule by mouth daily.   HAIR/SKIN/NAILS Tabs TAKE 1 TABLET DAILY   nystatin ointment Commonly known as:  MYCOSTATIN Apply 1 application topically 2 (two) times daily.   ONETOUCH DELICA LANCETS 33A Misc Use to test blood sugar once daily- E11.21   sertraline 100 MG tablet Commonly known as:  ZOLOFT Take 1 tablet (100 mg total) by mouth daily.   TYLENOL ARTHRITIS PAIN 650 MG CR tablet Generic drug:  acetaminophen 2 by mouth once daily as needed        Allergies:  Allergies  Allergen Reactions  . Bupropion Hcl     Palmar rash  . Penicillins     rash  .  Citalopram Hydrobromide Other (See Comments)    Unknown  . Metformin And Related Diarrhea    Past Medical History:  Diagnosis Date  . Allergy   . Anxiety   . Arthritis   . Arthritis   . Callus   . Depression   . Diabetes mellitus without complication ([place])   . Hyperlipidemia   . Hypertension     Past Surgical History:  Procedure Laterality Date  . ABDOMINAL HYSTERECTOMY    . CHOLECYSTECTOMY    . SPLENECTOMY      Family History  Problem Relation Age of Onset  . Cancer Mother   . Cancer Father   . Cancer Sister   . Cancer Brother     Social History:  reports that she has never smoked. She has never used smokeless tobacco. She reports that she does not drink alcohol or use drugs.   Review of Systems   Lipid history: LDL controlled with 20 mg Lipitor    Lab Results  Component Value Date   CHOL 187 02/07/2017   HDL 56.30 02/07/2017   LDLCALC 91 02/07/2017   LDLDIRECT 78.3 04/16/2013   TRIG 197.0 (H) 02/07/2017   CHOLHDL 3 02/07/2017           Hypertension: Currently on   Metoprolol and now amlodipine  On her last visit losartan was substituted and amlodipine started which she is tolerating, previously this was listed as causing cough Home blood pressure readings lately < 130 Also with this her potassium is back to normal  Most recent eye exam was In 8/17, reportedly no retinopathy  CKD: Not clear why she has renal dysfunction This is followed by nephrologist  HYPERKALEMIA: Resolved with stopping losartan  Lab Results  Component Value Date   CREATININE 1.49 (H) 06/20/2017   CREATININE 1.41 (H) 05/13/2017   CREATININE 1.80 (H) 03/27/2017   CREATININE 1.59 (H) 02/07/2017    Most recent foot exam:01/2017 Also followed by podiatrist   LABS:  Lab on 06/20/2017  Component Date Value Ref Range Status  . Fructosamine 06/20/2017 312* 0 -  285 umol/L Final   Comment: Published reference interval for apparently healthy subjects between age 66 and 82 is 28 - 285 umol/L and in a poorly controlled diabetic population is 228 - 563 umol/L with a mean of 396 umol/L.   Marland Kitchen Sodium 06/20/2017 132* 135 - 145 mEq/L Final  . Potassium 06/20/2017 5.0  3.5 - 5.1 mEq/L Final  . Chloride 06/20/2017 101  96 - 112 mEq/L Final  . CO2 06/20/2017 24  19 - 32 mEq/L Final  . Glucose, Bld 06/20/2017 191* 70 - 99 mg/dL Final  . BUN 06/20/2017 42* 6 - 23 mg/dL Final  . Creatinine, Ser 06/20/2017 1.49* 0.40 - 1.20 mg/dL Final  . Calcium 06/20/2017 9.6  8.4 - 10.5 mg/dL Final  . GFR 06/20/2017 35.74* >60.00 mL/min Final  . Microalb, Ur 06/20/2017 2.1* 0.0 - 1.9 mg/dL Final  . Creatinine,U 06/20/2017 84.6  mg/dL Final  . Microalb Creat Ratio 06/20/2017 2.5  0.0 - 30.0 mg/g Final    Physical Examination:  BP 114/74   Pulse 83   Ht 5' 3.75" (1.619 m)   Wt 263 lb 12.8 oz (119.7 kg)   SpO2 94%   BMI 45.64 kg/m        No ankle edema present  ASSESSMENT:  Diabetes type 2, uncontrolled With BMI 45   See history of present illness for detailed discussion of current diabetes management, blood sugar patterns  and problems identified  She has had long-standing diabetes with her last A1c around 8%  With adding basal insulin she is starting to have improvement in her blood sugars although not as well as expected Also she missed her Trulicity earlier this month She does appear to have some insulin resistance and is taking 20 units more than initially prescribed last month with blood sugars still around 1 40-1 70 range Lab glucose was 191 later in the morning before breakfast Also not checking enough postprandial readings to know if she needs mealtime insulin added at some point However she is doing very well with her diet with carbohydrate restriction  CKD: Creatinine stable  HYPERKALEMIA: This is resolved with stopping losartan   PLAN:     Discussed need to check readings after meals to help identify postprandial patterns especially after supper as this may be influencing her overnight readings also  However in the meantime she can try to target her morning sugars around 140 which would be adequate considering her age and general medical status  Consistent use of Trulicity every week  Discussed possibility of mealtime insulin if postprandial readings are consistently over 200  Continue prudent diet  HYPERTENSION: No change in treatment needed    Patient Instructions  Keep am sugar <140  More sugars after supper    Javae Braaten 06/24/2017, 10:59 AM   Note: This office note was prepared with Estate agent. Any transcriptional errors that result from this process are unintentional.

## 2017-07-11 LAB — HM MAMMOGRAPHY

## 2017-07-15 ENCOUNTER — Encounter: Payer: Self-pay | Admitting: *Deleted

## 2017-07-16 ENCOUNTER — Other Ambulatory Visit: Payer: Self-pay | Admitting: Family Medicine

## 2017-07-16 DIAGNOSIS — I10 Essential (primary) hypertension: Secondary | ICD-10-CM

## 2017-07-24 ENCOUNTER — Other Ambulatory Visit: Payer: Self-pay | Admitting: Endocrinology

## 2017-08-08 ENCOUNTER — Other Ambulatory Visit (INDEPENDENT_AMBULATORY_CARE_PROVIDER_SITE_OTHER): Payer: Medicare Other

## 2017-08-08 DIAGNOSIS — E1165 Type 2 diabetes mellitus with hyperglycemia: Secondary | ICD-10-CM | POA: Diagnosis not present

## 2017-08-08 LAB — COMPREHENSIVE METABOLIC PANEL
ALBUMIN: 3.8 g/dL (ref 3.5–5.2)
ALT: 24 U/L (ref 0–35)
AST: 25 U/L (ref 0–37)
Alkaline Phosphatase: 73 U/L (ref 39–117)
BILIRUBIN TOTAL: 0.7 mg/dL (ref 0.2–1.2)
BUN: 48 mg/dL — ABNORMAL HIGH (ref 6–23)
CALCIUM: 9.4 mg/dL (ref 8.4–10.5)
CO2: 24 mEq/L (ref 19–32)
CREATININE: 1.49 mg/dL — AB (ref 0.40–1.20)
Chloride: 101 mEq/L (ref 96–112)
GFR: 35.73 mL/min — AB (ref >60.00)
Glucose, Bld: 119 mg/dL — ABNORMAL HIGH (ref 70–99)
Potassium: 5.3 mEq/L — ABNORMAL HIGH (ref 3.5–5.1)
Sodium: 132 mEq/L — ABNORMAL LOW (ref 135–145)
Total Protein: 7.2 g/dL (ref 6.0–8.3)

## 2017-08-08 LAB — HEMOGLOBIN A1C: Hgb A1c MFr Bld: 7.5 % — ABNORMAL HIGH (ref 4.6–6.5)

## 2017-08-08 NOTE — Progress Notes (Unsigned)
Patient ID: Katelyn Mahoney, female   DOB: 12/01/1934, 83 y.o.   MRN: [id number]            Reason for Appointment: Follow-up for Type 2 Diabetes  Referring physician: Etter Sjogren   History of Present Illness:          Date of diagnosis of type 2 diabetes mellitus: ?  1983        Background history:   She had previously been on metformin with fairly good control for some time She was also at some point treated with Janumet Apparently subsequently she could not tolerate metformin and had diarrhea This was switched to glipizide about 2 years ago and she is also being treated with Januvia and Chana Bode was started in 9/17  Review of A1c indicates that her levels have been significantly high since 2016 She was initially seen in consultation on 02/27/17  Recent history:       Non-insulin hypoglycemic drugs the patient is taking are: Trulicity 1.5 mg weekly, glipizide ER 10 mg 2x a day  INSULIN regimen: Toujeo 26 units at bedtime  Current management, blood sugar patterns and problems identified:  Because of blood sugars as high as 229 she was started on TOUJEO on her last visit in 5/18  She was told to titrate this based on fasting blood sugars and she has continued to do so from her starting dose of 6 units  Most recently her blood sugars are around 148 and she just went up to 26 units insulin  She also has missed 1 dose of Trulicity 2 weeks ago which may have caused some high readings  She has however done very well with cutting back on carbohydrates and high-fat foods and is losing weight even with her blood sugars improving and taking insulin  She has significantly cut back on most carbohydrates like bread and potatoes but does eat cereal in the morning and does not check readings after eating  She is checking blood sugars only sporadically later in the day and mostly around suppertime on these are similar to the morning readings, highest reading of 240 after supper  Not  able to do much physical activity again        Side effects from medications have been: Diarrhea from metformin  Compliance with the medical regimen: Fair Hypoglycemia:   none   Glucose monitoring:  done 1  times a day         Glucometer: One Touch Verio       Blood Glucose readings by time of day  from download of meter :   Mean values apply above for all meters except median for One Touch  PRE-MEAL Fasting Lunch Dinner Bedtime Overall  Glucose range:  124-216   101-203  240    Mean/median: 163    166     Self-care:  Meal times are:  Breakfast is at 9-10 AM Typical meal intake: Breakfast is Usually cereal.  Lunch is variable or nothing.  Meals are usually chicken, salad, fish.  Snacks: Peanuts, yogurt                Dietician visit, most recent: 3/17                Exercise:  None, not able to do muchPhysical activity  Weight history:249-305  Wt Readings from Last 3 Encounters:  06/24/17 263 lb 12.8 oz (119.7 kg)  05/20/17 265 lb (120.2 kg)  04/01/17 272 lb (123.4 kg)  Glycemic control:   Lab Results  Component Value Date   HGBA1C 8.1 (H) 05/13/2017   HGBA1C 8.8 (H) 02/07/2017   HGBA1C 8.9 (H) 10/02/2016   Lab Results  Component Value Date   MICROALBUR 2.1 (H) 06/20/2017   LDLCALC 91 02/07/2017   CREATININE 1.49 (H) 06/20/2017   Lab Results  Component Value Date   MICRALBCREAT 2.5 06/20/2017   Lab Results  Component Value Date   FRUCTOSAMINE 312 (H) 06/20/2017   FRUCTOSAMINE 339 (H) 03/27/2017       Allergies as of 08/08/2017      Reactions   Bupropion Hcl    Palmar rash   Penicillins    rash   Citalopram Hydrobromide Other (See Comments)   Unknown   Metformin And Related Diarrhea      Medication List       Accurate as of 08/08/17  8:06 AM. Always use your most recent med list.          amLODipine 5 MG tablet Commonly known as:  NORVASC Take 1 tablet (5 mg total) by mouth daily.   aspirin 81 MG tablet Take 81 mg by mouth. 3 by mouth  in the evening   atorvastatin 20 MG tablet Commonly known as:  LIPITOR TAKE 1/2 TABLET BY MOUTH MONDAY, WEDNESDAY & FRIDAY AND SUNDAY   BILBERRY PO Take 470 mg by mouth. 2 by mouth daily   CLARITIN 10 MG Caps Generic drug:  Loratadine Take 1 capsule by mouth daily as needed.   FLECTOR 1.3 % Ptch Generic drug:  diclofenac APPLY 1 PATCH ONTO THE SKIN TWICE A DAY   fluocinonide cream 0.05 % Commonly known as:  LIDEX Apply 1 application topically 2 (two) times daily.   furosemide 20 MG tablet Commonly known as:  LASIX Take 1 tablet (20 mg total) by mouth daily.   furosemide 20 MG tablet Commonly known as:  LASIX TAKE 1 TABLET DAILY   glipiZIDE 10 MG 24 hr tablet Commonly known as:  GLUCOTROL XL Take 1 tablet (10 mg total) by mouth 2 (two) times daily.   GLUCOSAMINE PO Take by mouth daily.   glucose blood test strip Commonly known as:  ONETOUCH VERIO Use to test blood sugar twice a day.  DX: E11.21   Insulin Glargine 300 UNIT/ML Sopn Commonly known as:  TOUJEO SOLOSTAR Inject 10 Units into the skin daily.   Insulin Pen Needle 29G X 5MM Misc Use to inject insulin into skin 1-3 times daily   losartan 100 MG tablet Commonly known as:  COZAAR TAKE 1 TABLET DAILY   metoprolol tartrate 100 MG tablet Commonly known as:  LOPRESSOR TAKE 1 TABLET TWICE A DAY   multivitamin-lutein Caps capsule Take 1 capsule by mouth daily.   HAIR/SKIN/NAILS Tabs TAKE 1 TABLET DAILY   nystatin ointment Commonly known as:  MYCOSTATIN Apply 1 application topically 2 (two) times daily.   ONETOUCH DELICA LANCETS 89H Misc Use to test blood sugar once daily- E11.21   sertraline 100 MG tablet Commonly known as:  ZOLOFT Take 1 tablet (100 mg total) by mouth daily.   sertraline 100 MG tablet Commonly known as:  ZOLOFT TAKE 1 TABLET DAILY   TRULICITY 1.5 TD/4.2AJ Sopn Generic drug:  Dulaglutide INJECT WEEKLY AS DIRECTED   TYLENOL ARTHRITIS PAIN 650 MG CR tablet Generic drug:   acetaminophen 2 by mouth once daily as needed       Allergies:  Allergies  Allergen Reactions  . Bupropion Hcl  Palmar rash  . Penicillins     rash  . Citalopram Hydrobromide Other (See Comments)    Unknown  . Metformin And Related Diarrhea    Past Medical History:  Diagnosis Date  . Allergy   . Anxiety   . Arthritis   . Arthritis   . Callus   . Depression   . Diabetes mellitus without complication ([place])   . Hyperlipidemia   . Hypertension     Past Surgical History:  Procedure Laterality Date  . ABDOMINAL HYSTERECTOMY    . CHOLECYSTECTOMY    . SPLENECTOMY      Family History  Problem Relation Age of Onset  . Cancer Mother   . Cancer Father   . Cancer Sister   . Cancer Brother     Social History:  reports that she has never smoked. She has never used smokeless tobacco. She reports that she does not drink alcohol or use drugs.   Review of Systems   Lipid history: LDL controlled with 20 mg Lipitor    Lab Results  Component Value Date   CHOL 187 02/07/2017   HDL 56.30 02/07/2017   LDLCALC 91 02/07/2017   LDLDIRECT 78.3 04/16/2013   TRIG 197.0 (H) 02/07/2017   CHOLHDL 3 02/07/2017           Hypertension: Currently on   Metoprolol and now amlodipine  On her last visit losartan was substituted and amlodipine started which she is tolerating, previously this was listed as causing cough Home blood pressure readings lately < 130 Also with this her potassium is back to normal  Most recent eye exam was In 8/17, reportedly no retinopathy  CKD: Not clear why she has renal dysfunction This is followed by nephrologist  HYPERKALEMIA: Resolved with stopping losartan  Lab Results  Component Value Date   CREATININE 1.49 (H) 06/20/2017   CREATININE 1.41 (H) 05/13/2017   CREATININE 1.80 (H) 03/27/2017   CREATININE 1.59 (H) 02/07/2017    Most recent foot exam:01/2017 Also followed by podiatrist   LABS:  No visits with results within 1 Week(s) from  this visit.  Latest known visit with results is:  Abstract on 07/15/2017  Component Date Value Ref Range Status  . HM Mammogram 07/11/2017 0-4 Bi-Rad  0-4 Bi-Rad, Self Reported Normal Final    Physical Examination:  There were no vitals taken for this visit.       No ankle edema present  ASSESSMENT:  Diabetes type 2, uncontrolled With BMI 45   See history of present illness for detailed discussion of current diabetes management, blood sugar patterns and problems identified  She has had long-standing diabetes with her last A1c around 8%  With adding basal insulin she is starting to have improvement in her blood sugars although not as well as expected Also she missed her Trulicity earlier this month She does appear to have some insulin resistance and is taking 20 units more than initially prescribed last month with blood sugars still around 1 40-1 70 range Lab glucose was 191 later in the morning before breakfast Also not checking enough postprandial readings to know if she needs mealtime insulin added at some point However she is doing very well with her diet with carbohydrate restriction  CKD: Creatinine stable  HYPERKALEMIA: This is resolved with stopping losartan   PLAN:    Discussed need to check readings after meals to help identify postprandial patterns especially after supper as this may be influencing her overnight readings also  However  in the meantime she can try to target her morning sugars around 140 which would be adequate considering her age and general medical status  Consistent use of Trulicity every week  Discussed possibility of mealtime insulin if postprandial readings are consistently over 200  Continue prudent diet  HYPERTENSION: No change in treatment needed    There are no Patient Instructions on file for this visit.   Mehar Sagen 08/08/2017, 8:06 AM   Note: This office note was prepared with Estate agent. Any  transcriptional errors that result from this process are unintentional.

## 2017-08-11 NOTE — Progress Notes (Signed)
Patient ID: Katelyn Mahoney, female   DOB: 10-17-1934, 83 y.o.   MRN: [id number]            Reason for Appointment: Follow-up for Type 2 Diabetes  Referring physician: Etter Sjogren   History of Present Illness:          Date of diagnosis of type 2 diabetes mellitus: ?  1983        Background history:   She had previously been on metformin with fairly good control for some time She was also at some point treated with Janumet Apparently subsequently she could not tolerate metformin and had diarrhea This was switched to glipizide about 2 years ago and she is also being treated with Januvia and Chana Bode was started in 9/17  Review of A1c indicates that her levels have been significantly high since 2016 She was initially seen in consultation on 02/27/17  Recent history:       Non-insulin hypoglycemic drugs the patient is taking are: Trulicity 1.5 mg weekly, Friday , glipizide ER 10 mg 2x a day  INSULIN regimen: Toujeo 26 units at bedtime  A1c recently 7.5, previously 8.1  Current management, blood sugar patterns and problems identified:  She was told on her last visit to try taking her Trulicity more regularly every week  She has done this but has had couple of episodes where she did not use the pen correctly  She was also not doing enough readings AFTER meals  Toujeo was increased by 2 units on her last visit since fasting readings were averaging about 116  She did not bring her monitor for DOWNLOAD today  However she thinks her morning readings are better and occasionally low normal without hypoglycemia  She ever thinks her blood sugars after evening meal are consistently over 180 and probably averaging over 200  Not able to do much physical activity   She has gained some weight since her last visit        Side effects from medications have been: Diarrhea from metformin  Compliance with the medical regimen: Fair Hypoglycemia:   none   Glucose monitoring:  done 1   times a day         Glucometer: One Touch Verio       Blood Glucose readings by time of day by recall  Mean values apply above for all meters except median for One Touch  PRE-MEAL Fasting Lunch Dinner Bedtime Overall  Glucose range: 76-145      Mean/median: 135       POST-MEAL PC Breakfast PC Lunch PC Dinner  Glucose range:   189-236  Mean/median:        Self-care:  Meal times are:  Breakfast is at 9-10 AM Typical meal intake: Breakfast is Usually cereal.  Lunch is variable or nothing.  Meals are usually chicken, salad, fish.  Snacks: Peanuts, yogurt                Dietician visit, most recent: 3/17                Exercise:  None, not able to do much Physical activity  Weight history:249-305  Wt Readings from Last 3 Encounters:  08/12/17 269 lb 3.2 oz (122.1 kg)  06/24/17 263 lb 12.8 oz (119.7 kg)  05/20/17 265 lb (120.2 kg)    Glycemic control:   Lab Results  Component Value Date   HGBA1C 7.5 (H) 08/08/2017   HGBA1C 8.1 (H) 05/13/2017   HGBA1C 8.8 (H)  02/07/2017   Lab Results  Component Value Date   MICROALBUR 2.1 (H) 06/20/2017   LDLCALC 91 02/07/2017   CREATININE 1.49 (H) 08/08/2017   Lab Results  Component Value Date   MICRALBCREAT 2.5 06/20/2017   Lab Results  Component Value Date   FRUCTOSAMINE 312 (H) 06/20/2017   FRUCTOSAMINE 339 (H) 03/27/2017       Allergies as of 08/12/2017      Reactions   Bupropion Hcl    Palmar rash   Penicillins    rash   Citalopram Hydrobromide Other (See Comments)   Unknown   Metformin And Related Diarrhea      Medication List       Accurate as of 08/12/17  9:32 AM. Always use your most recent med list.          amLODipine 5 MG tablet Commonly known as:  NORVASC Take 1 tablet (5 mg total) by mouth daily.   aspirin 81 MG tablet Take 81 mg by mouth. 3 by mouth in the evening   atorvastatin 20 MG tablet Commonly known as:  LIPITOR TAKE 1/2 TABLET BY MOUTH MONDAY, WEDNESDAY & FRIDAY AND SUNDAY     BILBERRY PO Take 470 mg by mouth. 2 by mouth daily   CLARITIN 10 MG Caps Generic drug:  Loratadine Take 1 capsule by mouth daily as needed.   FLECTOR 1.3 % Ptch Generic drug:  diclofenac APPLY 1 PATCH ONTO THE SKIN TWICE A DAY   fluocinonide cream 0.05 % Commonly known as:  LIDEX Apply 1 application topically 2 (two) times daily.   furosemide 20 MG tablet Commonly known as:  LASIX TAKE 1 TABLET DAILY   glipiZIDE 10 MG 24 hr tablet Commonly known as:  GLUCOTROL XL Take 1 tablet (10 mg total) by mouth 2 (two) times daily.   GLUCOSAMINE PO Take by mouth daily.   glucose blood test strip Commonly known as:  ONETOUCH VERIO Use to test blood sugar twice a day.  DX: E11.21   insulin aspart 100 UNIT/ML FlexPen Commonly known as:  NOVOLOG FLEXPEN 5 Units at mealtime   Insulin Glargine 300 UNIT/ML Sopn Commonly known as:  TOUJEO SOLOSTAR Inject 10 Units into the skin daily.   Insulin Pen Needle 29G X 5MM Misc Use to inject insulin into skin 1-3 times daily   metoprolol tartrate 100 MG tablet Commonly known as:  LOPRESSOR TAKE 1 TABLET TWICE A DAY   multivitamin-lutein Caps capsule Take 1 capsule by mouth daily.   HAIR/SKIN/NAILS Tabs TAKE 1 TABLET DAILY   nystatin ointment Commonly known as:  MYCOSTATIN Apply 1 application topically 2 (two) times daily.   ONETOUCH DELICA LANCETS 16X Misc Use to test blood sugar once daily- E11.21   Semaglutide 0.25 or 0.5 MG/DOSE Sopn Commonly known as:  OZEMPIC Inject 0.5 mg into the skin once a week.   sertraline 100 MG tablet Commonly known as:  ZOLOFT Take 1 tablet (100 mg total) by mouth daily.   TRULICITY 1.5 WR/6.0AV Sopn Generic drug:  Dulaglutide INJECT WEEKLY AS DIRECTED   TYLENOL ARTHRITIS PAIN 650 MG CR tablet Generic drug:  acetaminophen 2 by mouth once daily as needed       Allergies:  Allergies  Allergen Reactions  . Bupropion Hcl     Palmar rash  . Penicillins     rash  . Citalopram  Hydrobromide Other (See Comments)    Unknown  . Metformin And Related Diarrhea    Past Medical History:  Diagnosis Date  .  Allergy   . Anxiety   . Arthritis   . Arthritis   . Callus   . Depression   . Diabetes mellitus without complication ([place])   . Hyperlipidemia   . Hypertension     Past Surgical History:  Procedure Laterality Date  . ABDOMINAL HYSTERECTOMY    . CHOLECYSTECTOMY    . SPLENECTOMY      Family History  Problem Relation Age of Onset  . Cancer Mother   . Cancer Father   . Cancer Sister   . Cancer Brother     Social History:  reports that she has never smoked. She has never used smokeless tobacco. She reports that she does not drink alcohol or use drugs.   Review of Systems   Lipid history: LDL controlled with 20 mg Lipitor    Lab Results  Component Value Date   CHOL 187 02/07/2017   HDL 56.30 02/07/2017   LDLCALC 91 02/07/2017   LDLDIRECT 78.3 04/16/2013   TRIG 197.0 (H) 02/07/2017   CHOLHDL 3 02/07/2017           Hypertension: Currently on   Metoprolol and now amlodipine  On her last visit losartan was substituted and amlodipine started which she is tolerating, previously this was listed as causing cough Home blood pressure readings lately < 130 Also with this her potassium is back to normal  Most recent eye exam was In 8/17, reportedly no retinopathy  CKD: Not clear why she has renal dysfunction This is followed by nephrologist   HYPERKALEMIA: Resolved with stopping losartan but this is up again She does tend to have some bananas and cantaloupes periodically  Lab Results  Component Value Date   CREATININE 1.49 (H) 08/08/2017   CREATININE 1.49 (H) 06/20/2017   CREATININE 1.41 (H) 05/13/2017   CREATININE 1.80 (H) 03/27/2017   Lab Results  Component Value Date   K 5.3 (H) 08/08/2017    Most recent foot exam:01/2017 Also followed by podiatrist   LABS:  Lab on 08/08/2017  Component Date Value Ref Range Status  . Hgb A1c MFr  Bld 08/08/2017 7.5* 4.6 - 6.5 % Final   Glycemic Control Guidelines for People with Diabetes:Non Diabetic:  <6%Goal of Therapy: <7%Additional Action Suggested:  >8%   . Sodium 08/08/2017 132* 135 - 145 mEq/L Final  . Potassium 08/08/2017 5.3* 3.5 - 5.1 mEq/L Final  . Chloride 08/08/2017 101  96 - 112 mEq/L Final  . CO2 08/08/2017 24  19 - 32 mEq/L Final  . Glucose, Bld 08/08/2017 119* 70 - 99 mg/dL Final  . BUN 08/08/2017 48* 6 - 23 mg/dL Final  . Creatinine, Ser 08/08/2017 1.49* 0.40 - 1.20 mg/dL Final  . Total Bilirubin 08/08/2017 0.7  0.2 - 1.2 mg/dL Final  . Alkaline Phosphatase 08/08/2017 73  39 - 117 U/L Final  . AST 08/08/2017 25  0 - 37 U/L Final  . ALT 08/08/2017 24  0 - 35 U/L Final  . Total Protein 08/08/2017 7.2  6.0 - 8.3 g/dL Final  . Albumin 08/08/2017 3.8  3.5 - 5.2 g/dL Final  . Calcium 08/08/2017 9.4  8.4 - 10.5 mg/dL Final  . GFR 08/08/2017 35.73* >60.00 mL/min Final    Physical Examination:  BP 130/86   Pulse 75   Ht 5' 3.75" (1.619 m)   Wt 269 lb 3.2 oz (122.1 kg)   SpO2 96%   BMI 46.57 kg/m        No ankle edema present  ASSESSMENT:  Diabetes type 2, uncontrolled With BMI 45   See history of present illness for detailed discussion of current diabetes management, blood sugar patterns and problems identified  She has had long-standing diabetes with her A1c gradually coming down This is likely from using Trulicity along with improving her diet  However she continues to have a poorly high postprandial readings at home at night despite using Trulicity She tends to have some difficulty getting the injection done right on a couple of times and appears to do better with traditional insulin pen type injections She may benefit from a more active drug like Ozempic  CKD: Creatinine stable  HYPERKALEMIA: This is Still present even with stopping losartan Probably has some  hypoaldosteronism with her CKD   PLAN:    Today discussed in detail the need for  mealtime insulin to cover postprandial spikes, action of mealtime insulin, use of the insulin pen, timing and action of the rapid acting insulin as well as starting dose and dosage titration to target the two-hour reading of under 180  She will start with 5 units of Novolog and adjust the dose based on her readings after meals over the next few weeks  She will also have the ability to adjust the dose based on how many carbohydrates she is eating and meal size  Will review this in 6 weeks  Consider consultation with nurse educator also  May reduce Toujeo back to 26 units and may need to cut back further if fasting readings are still low normal at times  Continue low-fat diet  Check readings after breakfast or lunch also periodically and bring monitor for download on next visit  OZEMPIC 0.5 mg weekly instead of Trulicity, showed her how the pen works and to use the pen needles similarly to insulin  HYPERTENSION: To follow-up with PCP reading  Hyperkalemia: Given low potassium diet printout, she'll cut back on bananas and cantaloupes She needs to have follow-up with her PCP are nephrologist   Counseling time on subjects discussed in assessment and plan sections is over 50% of today's 25 minute visit   Patient Instructions  Toujeo 26  Novolog 5 units at supper but adjust  +/- 2 units and keep 2 hr reading in range  Ozempic 0.5 weekly    Sadarius Norman 08/12/2017, 9:32 AM   Note: This office note was prepared with Estate agent. Any transcriptional errors that result from this process are unintentional.

## 2017-08-12 ENCOUNTER — Encounter: Payer: Self-pay | Admitting: Endocrinology

## 2017-08-12 ENCOUNTER — Ambulatory Visit (INDEPENDENT_AMBULATORY_CARE_PROVIDER_SITE_OTHER): Payer: Medicare Other | Admitting: Endocrinology

## 2017-08-12 ENCOUNTER — Telehealth: Payer: Self-pay | Admitting: Endocrinology

## 2017-08-12 ENCOUNTER — Telehealth: Payer: Self-pay

## 2017-08-12 ENCOUNTER — Other Ambulatory Visit: Payer: Self-pay

## 2017-08-12 VITALS — BP 130/86 | HR 75 | Ht 63.75 in | Wt 269.2 lb

## 2017-08-12 DIAGNOSIS — Z794 Long term (current) use of insulin: Secondary | ICD-10-CM | POA: Diagnosis not present

## 2017-08-12 DIAGNOSIS — E1165 Type 2 diabetes mellitus with hyperglycemia: Secondary | ICD-10-CM | POA: Diagnosis not present

## 2017-08-12 DIAGNOSIS — E875 Hyperkalemia: Secondary | ICD-10-CM

## 2017-08-12 MED ORDER — INSULIN ASPART 100 UNIT/ML FLEXPEN: PEN_INJECTOR | SUBCUTANEOUS | 1 refills | Status: DC

## 2017-08-12 MED ORDER — SEMAGLUTIDE(0.25 OR 0.5MG/DOS) 2 MG/1.5ML ~~LOC~~ SOPN: 0.5000 mg | PEN_INJECTOR | SUBCUTANEOUS | 2 refills | Status: DC

## 2017-08-12 MED ORDER — INSULIN LISPRO 100 UNIT/ML (KWIKPEN): PEN_INJECTOR | SUBCUTANEOUS | 3 refills | Status: DC

## 2017-08-12 NOTE — Telephone Encounter (Signed)
Changed the patients Novolog to Humalog and called to make the patient aware

## 2017-08-12 NOTE — Patient Instructions (Addendum)
Toujeo 26  Novolog 5 units at supper but adjust  +/- 2 units and keep 2 hr reading in range  Ozempic 0.5 weekly

## 2017-08-12 NOTE — Telephone Encounter (Signed)
[facility] called in reference to instructions on insulin aspart (NOVOLOG FLEXPEN) 100 UNIT/ML FlexPen for patient. Please call pharmacy and advise.

## 2017-08-12 NOTE — Telephone Encounter (Signed)
Called pharmacy and spoke to [place] and she is going to put use Novolog with sliding scale, up to 20 units per day, if this is long term use and if adjustments need to be made per mealtimes we can resend.

## 2017-08-19 ENCOUNTER — Ambulatory Visit (INDEPENDENT_AMBULATORY_CARE_PROVIDER_SITE_OTHER): Payer: Medicare Other | Admitting: Family Medicine

## 2017-08-19 ENCOUNTER — Encounter: Payer: Self-pay | Admitting: Family Medicine

## 2017-08-19 VITALS — BP 132/86 | HR 74 | Ht 64.0 in | Wt 270.0 lb

## 2017-08-19 DIAGNOSIS — E1121 Type 2 diabetes mellitus with diabetic nephropathy: Secondary | ICD-10-CM

## 2017-08-19 DIAGNOSIS — Z23 Encounter for immunization: Secondary | ICD-10-CM

## 2017-08-19 DIAGNOSIS — E785 Hyperlipidemia, unspecified: Secondary | ICD-10-CM

## 2017-08-19 DIAGNOSIS — N183 Chronic kidney disease, stage 3 (moderate): Secondary | ICD-10-CM | POA: Diagnosis not present

## 2017-08-19 DIAGNOSIS — E1151 Type 2 diabetes mellitus with diabetic peripheral angiopathy without gangrene: Secondary | ICD-10-CM | POA: Diagnosis not present

## 2017-08-19 DIAGNOSIS — E1165 Type 2 diabetes mellitus with hyperglycemia: Secondary | ICD-10-CM

## 2017-08-19 DIAGNOSIS — F32A Depression, unspecified: Secondary | ICD-10-CM

## 2017-08-19 DIAGNOSIS — R600 Localized edema: Secondary | ICD-10-CM

## 2017-08-19 DIAGNOSIS — E1169 Type 2 diabetes mellitus with other specified complication: Secondary | ICD-10-CM

## 2017-08-19 DIAGNOSIS — I10 Essential (primary) hypertension: Secondary | ICD-10-CM

## 2017-08-19 DIAGNOSIS — F329 Major depressive disorder, single episode, unspecified: Secondary | ICD-10-CM | POA: Diagnosis not present

## 2017-08-19 DIAGNOSIS — IMO0002 Reserved for concepts with insufficient information to code with codable children: Secondary | ICD-10-CM

## 2017-08-19 DIAGNOSIS — N1832 Chronic kidney disease, stage 3b: Secondary | ICD-10-CM

## 2017-08-19 LAB — COMPREHENSIVE METABOLIC PANEL
ALBUMIN: 3.5 g/dL (ref 3.5–5.2)
ALT: 25 U/L (ref 0–35)
AST: 28 U/L (ref 0–37)
Alkaline Phosphatase: 74 U/L (ref 39–117)
BUN: 30 mg/dL — ABNORMAL HIGH (ref 6–23)
CALCIUM: 9.1 mg/dL (ref 8.4–10.5)
CHLORIDE: 101 meq/L (ref 96–112)
CO2: 25 meq/L (ref 19–32)
Creatinine, Ser: 1.3 mg/dL — ABNORMAL HIGH (ref 0.40–1.20)
GFR: 41.81 mL/min — AB (ref >60.00)
Glucose, Bld: 185 mg/dL — ABNORMAL HIGH (ref 70–99)
POTASSIUM: 5.4 meq/L — AB (ref 3.5–5.1)
Sodium: 132 mEq/L — ABNORMAL LOW (ref 135–145)
Total Bilirubin: 0.6 mg/dL (ref 0.2–1.2)
Total Protein: 7.3 g/dL (ref 6.0–8.3)

## 2017-08-19 LAB — LIPID PANEL
CHOL/HDL RATIO: 3
CHOLESTEROL: 147 mg/dL (ref 0–200)
HDL: 51.8 mg/dL (ref >39.00)
LDL CALC: 73 mg/dL (ref 0–99)
NONHDL: 94.99
Triglycerides: 110 mg/dL (ref 0.0–149.0)
VLDL: 22 mg/dL (ref 0.0–40.0)

## 2017-08-19 MED ORDER — SEMAGLUTIDE(0.25 OR 0.5MG/DOS) 2 MG/1.5ML ~~LOC~~ SOPN: 0.5000 mg | PEN_INJECTOR | SUBCUTANEOUS | 2 refills | Status: DC

## 2017-08-19 MED ORDER — INSULIN GLARGINE 300 UNIT/ML ~~LOC~~ SOPN: 26.0000 [IU] | PEN_INJECTOR | Freq: Every day | SUBCUTANEOUS | Status: DC

## 2017-08-19 MED ORDER — ATORVASTATIN CALCIUM 20 MG PO TABS: ORAL_TABLET | ORAL | 3 refills | Status: DC

## 2017-08-19 MED ORDER — DULAGLUTIDE 1.5 MG/0.5ML ~~LOC~~ SOAJ: SUBCUTANEOUS | 3 refills | Status: DC

## 2017-08-19 MED ORDER — AMLODIPINE BESYLATE 5 MG PO TABS: 5.0000 mg | ORAL_TABLET | Freq: Every day | ORAL | 3 refills | Status: DC

## 2017-08-19 MED ORDER — INSULIN ASPART 100 UNIT/ML FLEXPEN: PEN_INJECTOR | SUBCUTANEOUS | 1 refills | Status: DC

## 2017-08-19 MED ORDER — SERTRALINE HCL 100 MG PO TABS: 100.0000 mg | ORAL_TABLET | Freq: Every day | ORAL | 1 refills | Status: DC

## 2017-08-19 MED ORDER — METOPROLOL TARTRATE 100 MG PO TABS: 100.0000 mg | ORAL_TABLET | Freq: Two times a day (BID) | ORAL | 0 refills | Status: DC

## 2017-08-19 MED ORDER — INSULIN PEN NEEDLE 29G X 5MM MISC: 2 refills | Status: DC

## 2017-08-19 MED ORDER — INSULIN LISPRO 100 UNIT/ML (KWIKPEN): PEN_INJECTOR | SUBCUTANEOUS | 3 refills | Status: DC

## 2017-08-19 MED ORDER — GLIPIZIDE ER 10 MG PO TB24: 10.0000 mg | ORAL_TABLET | Freq: Two times a day (BID) | ORAL | 1 refills | Status: DC

## 2017-08-19 NOTE — Progress Notes (Signed)
Patient ID: Katelyn Mahoney, female    DOB: [birth year]  Age: 83 y.o. MRN: [id number]    Subjective:  Subjective  HPI Katelyn Mahoney presents for cpe.  She c/o sore on her toe but has no other complaints.  She sees endo for dm and nephrology for her ckd.    Review of Systems  Constitutional: Negative for chills and fever.  HENT: Negative for congestion and hearing loss.   Eyes: Negative for discharge.  Respiratory: Negative for cough and shortness of breath.   Cardiovascular: Negative for chest pain, palpitations and leg swelling.  Gastrointestinal: Negative for abdominal pain, blood in stool, constipation, diarrhea, nausea and vomiting.  Genitourinary: Negative for dysuria, frequency, hematuria and urgency.  Musculoskeletal: Negative for back pain and myalgias.  Skin: Negative for rash.  Allergic/Immunologic: Negative for environmental allergies.  Neurological: Negative for dizziness, weakness and headaches.  Hematological: Does not bruise/bleed easily.  Psychiatric/Behavioral: Negative for suicidal ideas. The patient is not nervous/anxious.     History Past Medical History:  Diagnosis Date  . Allergy   . Anxiety   . Arthritis   . Arthritis   . Callus   . Depression   . Diabetes mellitus without complication ([place])   . Hyperlipidemia   . Hypertension     She has a past surgical history that includes Cholecystectomy; Abdominal hysterectomy; and Splenectomy.   Her family history includes Cancer in her brother, father, mother, and sister.She reports that she has never smoked. She has never used smokeless tobacco. She reports that she does not drink alcohol or use drugs.  Current Outpatient Prescriptions on File Prior to Visit  Medication Sig Dispense Refill  . acetaminophen (TYLENOL ARTHRITIS PAIN) 650 MG CR tablet 2 by mouth once daily as needed    . aspirin 81 MG tablet Take 81 mg by mouth. 3 by mouth in the evening    . Bilberry, Vaccinium myrtillus, (BILBERRY PO) Take  470 mg by mouth. 2 by mouth daily    . FLECTOR 1.3 % PTCH APPLY 1 PATCH ONTO THE SKIN TWICE A DAY (Patient taking differently: APPLY 1 PATCH ONTO THE SKIN TWICE A DAY PRN) 30 patch 2  . fluocinonide cream (LIDEX) 2.87 % Apply 1 application topically 2 (two) times daily.    . furosemide (LASIX) 20 MG tablet TAKE 1 TABLET DAILY 90 tablet 1  . Glucosamine HCl (GLUCOSAMINE PO) Take by mouth daily.    Marland Kitchen glucose blood (ONETOUCH VERIO) test strip Use to test blood sugar twice a day.  DX: E11.21 100 each 3  . Loratadine (CLARITIN) 10 MG CAPS Take 1 capsule by mouth daily as needed.     . Multiple Vitamins-Minerals (HAIR/SKIN/NAILS) TABS TAKE 1 TABLET DAILY 90 tablet 2  . multivitamin-lutein (OCUVITE-LUTEIN) CAPS capsule Take 1 capsule by mouth daily. 90 capsule 3  . nystatin ointment (MYCOSTATIN) Apply 1 application topically 2 (two) times daily. (Patient taking differently: Apply 1 application topically as needed. ) 30 g 6  . ONETOUCH DELICA LANCETS 86V MISC Use to test blood sugar once daily- E11.21 100 each 3   No current facility-administered medications on file prior to visit.      Objective:  Objective  Physical Exam  Constitutional: She is oriented to person, place, and time. She appears well-developed and well-nourished.  HENT:  Head: Normocephalic and atraumatic.  Eyes: Conjunctivae and EOM are normal.  Neck: Normal range of motion. Neck supple. No JVD present. Carotid bruit is not present. No thyromegaly present.  Cardiovascular: Normal  rate, regular rhythm and normal heart sounds.   No murmur heard. Pulmonary/Chest: Effort normal and breath sounds normal. No respiratory distress. She has no wheezes. She has no rales. She exhibits no tenderness.  Musculoskeletal: She exhibits no edema.  Neurological: She is alert and oriented to person, place, and time.  Skin:     Psychiatric: She has a normal mood and affect.  Nursing note and vitals reviewed.  BP 132/86   Pulse 74   Ht 5\' 4"   (1.626 m)   Wt 270 lb (122.5 kg)   SpO2 96%   BMI 46.35 kg/m  Wt Readings from Last 3 Encounters:  08/19/17 270 lb (122.5 kg)  08/12/17 269 lb 3.2 oz (122.1 kg)  06/24/17 263 lb 12.8 oz (119.7 kg)     Lab Results  Component Value Date   WBC 14.3 (H) 05/03/2016   HGB 15.5 05/03/2016   HCT 43.2 05/03/2016   PLT 322 05/03/2016   GLUCOSE 185 (H) 08/19/2017   CHOL 147 08/19/2017   TRIG 110.0 08/19/2017   HDL 51.80 08/19/2017   LDLDIRECT 78.3 04/16/2013   LDLCALC 73 08/19/2017   ALT 25 08/19/2017   AST 28 08/19/2017   NA 132 (L) 08/19/2017   K 5.4 (H) 08/19/2017   CL 101 08/19/2017   CREATININE 1.30 (H) 08/19/2017   BUN 30 (H) 08/19/2017   CO2 25 08/19/2017   TSH 1.28 07/07/2015   HGBA1C 7.5 (H) 08/08/2017   MICROALBUR 2.1 (H) 06/20/2017    No results found.   Assessment & Plan:  Plan  I have changed Ms. Herling's TRULICITY to Dulaglutide. I have also changed her Insulin Glargine and metoprolol tartrate. I am also having her maintain her aspirin, acetaminophen, (Bilberry, Vaccinium myrtillus, (BILBERRY PO)), multivitamin-lutein, FLECTOR, nystatin ointment, Loratadine, Glucosamine HCl (GLUCOSAMINE PO), fluocinonide cream, ONETOUCH DELICA LANCETS 59D, HAIR/SKIN/NAILS, glucose blood, furosemide, Semaglutide, insulin lispro, Insulin Pen Needle, insulin aspart, glipiZIDE, atorvastatin, amLODipine, and sertraline.  Meds ordered this encounter  Medications  . Dulaglutide (TRULICITY) 1.5 JT/7.0VX SOPN    Sig: INJECT WEEKLY AS DIRECTED    Dispense:  2 mL    Refill:  3  . Semaglutide (OZEMPIC) 0.25 or 0.5 MG/DOSE SOPN    Sig: Inject 0.5 mg into the skin once a week.    Dispense:  1 pen    Refill:  2  . insulin lispro (HUMALOG KWIKPEN) 100 UNIT/ML KiwkPen    Sig: Inject 5 units at meal time    Dispense:  15 pen    Refill:  3  . Insulin Pen Needle 29G X 5MM MISC    Sig: Use to inject insulin into skin 1-3 times daily    Dispense:  100 each    Refill:  2  . Insulin Glargine  (TOUJEO SOLOSTAR) 300 UNIT/ML SOPN    Sig: Inject 26 Units into the skin daily.  . insulin aspart (NOVOLOG FLEXPEN) 100 UNIT/ML FlexPen    Sig: 5 Units at mealtime    Dispense:  15 mL    Refill:  1  . glipiZIDE (GLUCOTROL XL) 10 MG 24 hr tablet    Sig: Take 1 tablet (10 mg total) by mouth 2 (two) times daily.    Dispense:  180 tablet    Refill:  1  . atorvastatin (LIPITOR) 20 MG tablet    Sig: TAKE 1/2 TABLET BY MOUTH MONDAY, WEDNESDAY & FRIDAY AND SUNDAY    Dispense:  26 tablet    Refill:  3  . amLODipine (NORVASC) 5  MG tablet    Sig: Take 1 tablet (5 mg total) by mouth daily.    Dispense:  30 tablet    Refill:  3  . metoprolol tartrate (LOPRESSOR) 100 MG tablet    Sig: Take 1 tablet (100 mg total) by mouth 2 (two) times daily.    Dispense:  180 tablet    Refill:  0  . sertraline (ZOLOFT) 100 MG tablet    Sig: Take 1 tablet (100 mg total) by mouth daily.    Dispense:  90 tablet    Refill:  1    Problem List Items Addressed This Visit      Unprioritized   CKD stage G3b/A1, GFR 30-44 and albumin creatinine ratio <30 mg/g    Per nephrology      Depression    .stable con't meds      Relevant Medications   sertraline (ZOLOFT) 100 MG tablet   Essential hypertension    Well controlled, no changes to meds. Encouraged heart healthy diet such as the DASH diet and exercise as tolerated.       Relevant Medications   atorvastatin (LIPITOR) 20 MG tablet   amLODipine (NORVASC) 5 MG tablet   metoprolol tartrate (LOPRESSOR) 100 MG tablet   Hyperlipidemia associated with type 2 diabetes mellitus ([place])    Tolerating statin, encouraged heart healthy diet, avoid trans fats, minimize simple carbs and saturated fats. Increase exercise as tolerated      Relevant Medications   Dulaglutide (TRULICITY) 1.5 KX/3.8HW SOPN   Semaglutide (OZEMPIC) 0.25 or 0.5 MG/DOSE SOPN   insulin lispro (HUMALOG KWIKPEN) 100 UNIT/ML KiwkPen   Insulin Glargine (TOUJEO SOLOSTAR) 300 UNIT/ML SOPN    insulin aspart (NOVOLOG FLEXPEN) 100 UNIT/ML FlexPen   glipiZIDE (GLUCOTROL XL) 10 MG 24 hr tablet   atorvastatin (LIPITOR) 20 MG tablet   amLODipine (NORVASC) 5 MG tablet   metoprolol tartrate (LOPRESSOR) 100 MG tablet   DM (diabetes mellitus) type II uncontrolled, periph vascular disorder (HCC)    Per endo      Relevant Medications   Dulaglutide (TRULICITY) 1.5 EX/9.3ZJ SOPN   Semaglutide (OZEMPIC) 0.25 or 0.5 MG/DOSE SOPN   insulin lispro (HUMALOG KWIKPEN) 100 UNIT/ML KiwkPen   Insulin Pen Needle 29G X 5MM MISC   Insulin Glargine (TOUJEO SOLOSTAR) 300 UNIT/ML SOPN   insulin aspart (NOVOLOG FLEXPEN) 100 UNIT/ML FlexPen   glipiZIDE (GLUCOTROL XL) 10 MG 24 hr tablet   atorvastatin (LIPITOR) 20 MG tablet   amLODipine (NORVASC) 5 MG tablet   metoprolol tartrate (LOPRESSOR) 100 MG tablet   Edema of lower extremity   Hyperlipidemia LDL goal <70    Tolerating statin, encouraged heart healthy diet, avoid trans fats, minimize simple carbs and saturated fats. Increase exercise as tolerated      Relevant Medications   atorvastatin (LIPITOR) 20 MG tablet   amLODipine (NORVASC) 5 MG tablet   metoprolol tartrate (LOPRESSOR) 100 MG tablet   Other Relevant Orders   Lipid panel (Completed)   Comprehensive metabolic panel (Completed)    Other Visit Diagnoses    Influenza vaccine needed    -  Primary   Need for pneumococcal vaccine       Relevant Orders   Pneumococcal polysaccharide vaccine 23-valent greater than or equal to 2yo subcutaneous/IM (Completed)      Follow-up: Return in about 6 months (around 02/19/2018), or if symptoms worsen or fail to improve.  Ann Held, DO

## 2017-08-19 NOTE — Assessment & Plan Note (Signed)
Tolerating statin, encouraged heart healthy diet, avoid trans fats, minimize simple carbs and saturated fats. Increase exercise as tolerated 

## 2017-08-19 NOTE — Patient Instructions (Signed)

## 2017-08-19 NOTE — Assessment & Plan Note (Signed)
Per endo °

## 2017-08-19 NOTE — Assessment & Plan Note (Signed)
Per nephrology 

## 2017-08-19 NOTE — Assessment & Plan Note (Signed)
.  stable con't meds

## 2017-08-19 NOTE — Assessment & Plan Note (Signed)
Well controlled, no changes to meds. Encouraged heart healthy diet such as the DASH diet and exercise as tolerated.  °

## 2017-08-20 ENCOUNTER — Encounter: Payer: Self-pay | Admitting: Endocrinology

## 2017-08-20 DIAGNOSIS — R600 Localized edema: Secondary | ICD-10-CM | POA: Insufficient documentation

## 2017-08-20 DIAGNOSIS — E785 Hyperlipidemia, unspecified: Secondary | ICD-10-CM | POA: Insufficient documentation

## 2017-08-20 NOTE — Assessment & Plan Note (Signed)
Tolerating statin, encouraged heart healthy diet, avoid trans fats, minimize simple carbs and saturated fats. Increase exercise as tolerated 

## 2017-08-20 NOTE — Telephone Encounter (Signed)
She is supposed to be seen in end of September with labs, please schedule    ----- Message -----  From: Philemon Kingdom, MD  Sent: 08/20/2017 12:18 PM  To: Elayne Snare, MD, Caprice Beaver, LPN  Subject: RE: Visit Follow-Up Question             Routed to Dr. Dwyane Dee.  C  ----- Message -----  From: Caprice Beaver, LPN  Sent: 04/15/3844 11:45 AM  To: Philemon Kingdom, MD  Subject: FW: Visit Follow-Up Question             Please advise.  Thank you!     ----- Message -----  From: [name]  Sent: 08/20/2017 11:37 AM  To: [facility] Clinical [facility]  Subject: Visit Follow-Up Question               [facility] tells me my insurance will no longer pay for the [facility], can you send them a prescription for something else?  Thanks.  Marcey Persad   I have the patient scheduled for the end of September with labs

## 2017-08-21 ENCOUNTER — Other Ambulatory Visit: Payer: Self-pay | Admitting: Endocrinology

## 2017-08-21 MED ORDER — INSULIN DEGLUDEC 100 UNIT/ML ~~LOC~~ SOPN: 26.0000 [IU] | PEN_INJECTOR | Freq: Every day | SUBCUTANEOUS | 1 refills | Status: DC

## 2017-09-10 ENCOUNTER — Other Ambulatory Visit: Payer: Self-pay | Admitting: Endocrinology

## 2017-09-10 NOTE — Telephone Encounter (Signed)
Please send to her PCP

## 2017-09-10 NOTE — Telephone Encounter (Signed)
This has been filled from a different provider. Please advise if okay to refill.

## 2017-09-19 ENCOUNTER — Other Ambulatory Visit (INDEPENDENT_AMBULATORY_CARE_PROVIDER_SITE_OTHER): Payer: Medicare Other

## 2017-09-19 DIAGNOSIS — Z794 Long term (current) use of insulin: Secondary | ICD-10-CM

## 2017-09-19 DIAGNOSIS — E1165 Type 2 diabetes mellitus with hyperglycemia: Secondary | ICD-10-CM

## 2017-09-19 LAB — BASIC METABOLIC PANEL
BUN: 36 mg/dL — AB (ref 6–23)
CO2: 23 mEq/L (ref 19–32)
CREATININE: 1.27 mg/dL — AB (ref 0.40–1.20)
Calcium: 9.6 mg/dL (ref 8.4–10.5)
Chloride: 105 mEq/L (ref 96–112)
GFR: 42.95 mL/min — AB (ref >60.00)
GLUCOSE: 167 mg/dL — AB (ref 70–99)
POTASSIUM: 5.6 meq/L — AB (ref 3.5–5.1)
Sodium: 135 mEq/L (ref 135–145)

## 2017-09-20 LAB — FRUCTOSAMINE: Fructosamine: 301 umol/L — ABNORMAL HIGH (ref 0–285)

## 2017-09-22 NOTE — Progress Notes (Signed)
Patient ID: Katelyn Mahoney, female   DOB: 08-Oct-1934, 83 y.o.   MRN: [id number]            Reason for Appointment: Follow-up for Type 2 Diabetes  Referring physician: Etter Sjogren   History of Present Illness:          Date of diagnosis of type 2 diabetes mellitus: ?  1983        Background history:   She had previously been on metformin with fairly good control for some time She was also at some point treated with Janumet Apparently subsequently she could not tolerate metformin and had diarrhea This was switched to glipizide about 2 years ago and she is also being treated with Januvia and Chana Bode was started in 9/17  Review of A1c indicates that her levels have been significantly high since 2016 She was initially seen in consultation on 02/27/17  Recent history:       Non-insulin hypoglycemic drugs the patient is taking are: Ozempic 0.5 mg weekly, glipizide ER 10 mg 2x a day  INSULIN regimen: Toujeo 26 units at bedtime, Humalog 5 units at supper  A1c recently 7.5, previously 8.1  Current management, blood sugar patterns and problems identified:  She was told on her last visit to switch Trulicity to [facility] to help her with better control overall especially postprandial and provide weight loss  She was also told to take HUMALOG at suppertime to control her postprandial readings  Her blood sugars are somewhat better overall but still occasionally over 200, previously her blood sugars are mostly over 200 after supper  She has done a little better with her weight.  This was previously going up  FASTING readings are somewhat high but generally below 140, her Toujeo was not changed on her last visit  She does occasionally eat a larger lunch and then may not eat much at suppertime  However has no readings after breakfast or lunch and also has not taken any HUMALOG with any large meals during the day  Blood sugars were not available on her previous visit on her monitor    Side effects from medications have been: Diarrhea from metformin  Compliance with the medical regimen: Fair Hypoglycemia:   none   Glucose monitoring:  done 1 +  times a day         Glucometer: One Touch Verio       Blood Glucose readings by time of day by rec  Mean values apply above for all meters except median for One Touch  PRE-MEAL Fasting Lunch Dinner Bedtime Overall  Glucose range:  84-1 58    92-255    Mean/median:    170  154+/-41      Self-care:  Meal times are:  Breakfast is at 9-10 AM Typical meal intake: Breakfast is Usually cereal.  Lunch is variable or nothing.  Meals are usually chicken, salad, fish.  Snacks: Peanuts, yogurt                Dietician visit, most recent: 3/17                Exercise:  None, not able to do much Physical activity  Weight history:249-305  Wt Readings from Last 3 Encounters:  09/23/17 267 lb 6.4 oz (121.3 kg)  08/19/17 270 lb (122.5 kg)  08/12/17 269 lb 3.2 oz (122.1 kg)    Glycemic control:   Lab Results  Component Value Date   HGBA1C 7.5 (H) 08/08/2017  HGBA1C 8.1 (H) 05/13/2017   HGBA1C 8.8 (H) 02/07/2017   Lab Results  Component Value Date   MICROALBUR 2.1 (H) 06/20/2017   LDLCALC 73 08/19/2017   CREATININE 1.27 (H) 09/19/2017   Lab Results  Component Value Date   MICRALBCREAT 2.5 06/20/2017   Lab Results  Component Value Date   FRUCTOSAMINE 301 (H) 09/19/2017   FRUCTOSAMINE 312 (H) 06/20/2017   FRUCTOSAMINE 339 (H) 03/27/2017       Allergies as of 09/23/2017      Reactions   Bupropion Hcl    Palmar rash   Penicillins    rash   Citalopram Hydrobromide Other (See Comments)   Unknown   Metformin And Related Diarrhea      Medication List       Accurate as of 09/23/17  1:18 PM. Always use your most recent med list.          amLODipine 5 MG tablet Commonly known as:  NORVASC TAKE 1 TABLET BY MOUTH DAILY   aspirin 81 MG tablet Take 81 mg by mouth. 3 by mouth in the evening   atorvastatin 20  MG tablet Commonly known as:  LIPITOR TAKE 1/2 TABLET BY MOUTH MONDAY, WEDNESDAY & FRIDAY AND SUNDAY   BILBERRY PO Take 470 mg by mouth. 2 by mouth daily   CLARITIN 10 MG Caps Generic drug:  Loratadine Take 1 capsule by mouth daily as needed.   FLECTOR 1.3 % Ptch Generic drug:  diclofenac APPLY 1 PATCH ONTO THE SKIN TWICE A DAY   fluocinonide cream 0.05 % Commonly known as:  LIDEX Apply 1 application topically 2 (two) times daily.   furosemide 20 MG tablet Commonly known as:  LASIX TAKE 1 TABLET DAILY   glipiZIDE 10 MG 24 hr tablet Commonly known as:  GLUCOTROL XL Take 1 tablet (10 mg total) by mouth 2 (two) times daily.   GLUCOSAMINE PO Take by mouth daily.   glucose blood test strip Commonly known as:  ONETOUCH VERIO Use to test blood sugar twice a day.  DX: E11.21   insulin degludec 100 UNIT/ML Sopn FlexTouch Pen Commonly known as:  TRESIBA FLEXTOUCH Inject 0.26 mLs (26 Units total) into the skin daily.   insulin lispro 100 UNIT/ML KiwkPen Commonly known as:  HUMALOG KWIKPEN Inject 5 units at meal time   Insulin Pen Needle 29G X 5MM Misc Use to inject insulin into skin 1-3 times daily   metoprolol tartrate 100 MG tablet Commonly known as:  LOPRESSOR Take 1 tablet (100 mg total) by mouth 2 (two) times daily.   multivitamin-lutein Caps capsule Take 1 capsule by mouth daily.   HAIR/SKIN/NAILS Tabs TAKE 1 TABLET DAILY   nystatin ointment Commonly known as:  MYCOSTATIN Apply 1 application topically 2 (two) times daily.   ONETOUCH DELICA LANCETS 33G Misc Use to test blood sugar once daily- E11.21   Semaglutide 0.25 or 0.5 MG/DOSE Sopn Commonly known as:  OZEMPIC Inject 0.5 mg into the skin once a week.   sertraline 100 MG tablet Commonly known as:  ZOLOFT Take 1 tablet (100 mg total) by mouth daily.   TYLENOL ARTHRITIS PAIN 650 MG CR tablet Generic drug:  acetaminophen 2 by mouth once daily as needed            Discharge Care Instructions         Start     Ordered   09/23/17 0000  Flu Vaccine QUAD 36+ mos IM     09 /24/18 0940  Allergies:  Allergies  Allergen Reactions  . Bupropion Hcl     Palmar rash  . Penicillins     rash  . Citalopram Hydrobromide Other (See Comments)    Unknown  . Metformin And Related Diarrhea    Past Medical History:  Diagnosis Date  . Allergy   . Anxiety   . Arthritis   . Arthritis   . Callus   . Depression   . Diabetes mellitus without complication ([place])   . Hyperlipidemia   . Hypertension     Past Surgical History:  Procedure Laterality Date  . ABDOMINAL HYSTERECTOMY    . CHOLECYSTECTOMY    . SPLENECTOMY      Family History  Problem Relation Age of Onset  . Cancer Mother   . Cancer Father   . Cancer Sister   . Cancer Brother     Social History:  reports that she has never smoked. She has never used smokeless tobacco. She reports that she does not drink alcohol or use drugs.   Review of Systems   Lipid history: LDL controlled with 20 mg Lipitor    Lab Results  Component Value Date   CHOL 147 08/19/2017   HDL 51.80 08/19/2017   LDLCALC 73 08/19/2017   LDLDIRECT 78.3 04/16/2013   TRIG 110.0 08/19/2017   CHOLHDL 3 08/19/2017           Hypertension: Currently on   Metoprolol and now amlodipine  On her last visit losartan was substituted and amlodipine started which she is tolerating, previously this was listed as causing cough Home blood pressure readings lately < 130 Also with this her potassium is back to normal  Most recent eye exam was In 8/17, reportedly no retinopathy  CKD: Not clear why she has renal dysfunction This is followed by nephrologist   HYPERKALEMIA: Resolved with stopping losartan but this is up again She does tend to have some bananas and cantaloupes periodically  Lab Results  Component Value Date   CREATININE 1.27 (H) 09/19/2017   CREATININE 1.30 (H) 08/19/2017   CREATININE 1.49 (H) 08/08/2017   CREATININE 1.49 (H)  06/20/2017   Lab Results  Component Value Date   K 5.6 (H) 09/19/2017    Most recent foot exam:01/2017 Also followed by podiatrist   LABS:  Lab on 09/19/2017  Component Date Value Ref Range Status  . Sodium 09/19/2017 135  135 - 145 mEq/L Final  . Potassium 09/19/2017 5.6* 3.5 - 5.1 mEq/L Final  . Chloride 09/19/2017 105  96 - 112 mEq/L Final  . CO2 09/19/2017 23  19 - 32 mEq/L Final  . Glucose, Bld 09/19/2017 167* 70 - 99 mg/dL Final  . BUN 09/19/2017 36* 6 - 23 mg/dL Final  . Creatinine, Ser 09/19/2017 1.27* 0.40 - 1.20 mg/dL Final  . Calcium 09/19/2017 9.6  8.4 - 10.5 mg/dL Final  . GFR 09/19/2017 42.95* >60.00 mL/min Final  . Fructosamine 09/19/2017 301* 0 - 285 umol/L Final   Comment: Published reference interval for apparently healthy subjects between age 31 and 88 is 44 - 285 umol/L and in a poorly controlled diabetic population is 228 - 563 umol/L with a mean of 396 umol/L.     Physical Examination:  BP 124/84   Pulse 74   Ht 5\' 4"  (1.626 m)   Wt 267 lb 6.4 oz (121.3 kg)   SpO2 96%   BMI 45.90 kg/m        No ankle edema present  ASSESSMENT:  Diabetes  type 2, uncontrolled With BMI 45   See history of present illness for detailed discussion of current diabetes management, blood sugar patterns and problems identified   Although she has generally done better with adding a GLP-1 drug to her basal insulin she is now requiring mealtime insulin for better control She has taken Humalog but is not adjusting this based on her meal size Currently only taking this at suppertime  HYPERKALEMIA: This is Still present even with her trying to watch her diet Dietary factors were reviewed today and she appears to be watching the usual hypertension foods fairly well Probably has some  hypoaldosteronism with her CKD However she may well have mild hyperkalemia related to taking large doses of METOPROLOL  PLAN:    She can continue same dose of Humalog at suppertime, may  adjust up or down one unit for larger/smaller meals  Also will take it at lunchtime if eating out or eating a large meal  Some more readings after breakfast and lunch to be done  Consider increasing Ozempic if her A1c is still high  METOPROLOL can be reduced by 50 mg at least for now   Hyperkalemia: Needs to be seen by nephrologist     Patient Instructions  Take only to 1/2 in pm, 1 in am of Metoprolol  See Kidney Doc  Take HUMALOG before lunch when eating out  Check blood sugars on waking up 4/7  Also check blood sugars about 2 hours after a meal and do this after different meals by rotation  Recommended blood sugar levels on waking up is 90-130 and about 2 hours after meal is 130-160  Please bring your blood sugar monitor to each visit, thank you        [facility] 09/23/2017, 1:18 PM   Note: This office note was prepared with Dragon voice recognition system technology. Any transcriptional errors that result from this process are unintentional.

## 2017-09-23 ENCOUNTER — Encounter: Payer: Self-pay | Admitting: Endocrinology

## 2017-09-23 ENCOUNTER — Ambulatory Visit (INDEPENDENT_AMBULATORY_CARE_PROVIDER_SITE_OTHER): Payer: Medicare Other | Admitting: Endocrinology

## 2017-09-23 VITALS — BP 124/84 | HR 74 | Ht 64.0 in | Wt 267.4 lb

## 2017-09-23 DIAGNOSIS — Z794 Long term (current) use of insulin: Secondary | ICD-10-CM

## 2017-09-23 DIAGNOSIS — E875 Hyperkalemia: Secondary | ICD-10-CM | POA: Diagnosis not present

## 2017-09-23 DIAGNOSIS — Z23 Encounter for immunization: Secondary | ICD-10-CM | POA: Diagnosis not present

## 2017-09-23 DIAGNOSIS — I10 Essential (primary) hypertension: Secondary | ICD-10-CM

## 2017-09-23 DIAGNOSIS — E1165 Type 2 diabetes mellitus with hyperglycemia: Secondary | ICD-10-CM

## 2017-09-23 NOTE — Patient Instructions (Signed)
Take only to 1/2 in pm, 1 in am of Metoprolol  See Kidney Doc  Take HUMALOG before lunch when eating out  Check blood sugars on waking up 4/7  Also check blood sugars about 2 hours after a meal and do this after different meals by rotation  Recommended blood sugar levels on waking up is 90-130 and about 2 hours after meal is 130-160  Please bring your blood sugar monitor to each visit, thank you

## 2017-09-27 ENCOUNTER — Ambulatory Visit: Payer: Medicare Other

## 2017-10-15 ENCOUNTER — Other Ambulatory Visit: Payer: Self-pay | Admitting: Family Medicine

## 2017-10-15 DIAGNOSIS — I10 Essential (primary) hypertension: Secondary | ICD-10-CM

## 2017-10-25 ENCOUNTER — Other Ambulatory Visit: Payer: Self-pay | Admitting: Family Medicine

## 2017-10-25 DIAGNOSIS — E785 Hyperlipidemia, unspecified: Secondary | ICD-10-CM

## 2017-11-11 ENCOUNTER — Telehealth: Payer: Self-pay | Admitting: Endocrinology

## 2017-11-11 NOTE — Telephone Encounter (Signed)
Okay to fill? 

## 2017-11-11 NOTE — Telephone Encounter (Signed)
Please advise if okay to fill. Last filled by different provider.

## 2017-11-11 NOTE — Telephone Encounter (Signed)
This medication has been filled per Dr. Dwyane Dee.

## 2017-12-03 ENCOUNTER — Other Ambulatory Visit: Payer: Self-pay | Admitting: Family Medicine

## 2017-12-03 DIAGNOSIS — R600 Localized edema: Secondary | ICD-10-CM

## 2017-12-05 ENCOUNTER — Telehealth: Payer: Self-pay | Admitting: Endocrinology

## 2017-12-06 NOTE — Telephone Encounter (Signed)
Okay to refill, previously done by PCP

## 2017-12-06 NOTE — Telephone Encounter (Signed)
Please advise if okay to fill  

## 2017-12-06 NOTE — Telephone Encounter (Signed)
This has been filled 

## 2017-12-11 ENCOUNTER — Other Ambulatory Visit: Payer: Self-pay

## 2017-12-16 ENCOUNTER — Other Ambulatory Visit: Payer: Self-pay | Admitting: Family Medicine

## 2017-12-16 MED ORDER — AMLODIPINE BESYLATE 5 MG PO TABS: 5.0000 mg | ORAL_TABLET | Freq: Every day | ORAL | 5 refills | Status: DC

## 2017-12-16 NOTE — Telephone Encounter (Signed)
Copied from [place] [id number]. Topic: Quick Communication - Rx Refill/Question >> Dec 16, 2017  3:03 PM Tye [place] wrote: Pt called to get the Rx of amLODipine (NORVASC) 5 MG tablet [[id number]] refilled, the pharmacy has sent over a request, contact pharmacy if needed

## 2017-12-16 NOTE — Telephone Encounter (Signed)
Rx sent to [facility], wasn't sure where to send (not noted below).

## 2017-12-25 ENCOUNTER — Other Ambulatory Visit (INDEPENDENT_AMBULATORY_CARE_PROVIDER_SITE_OTHER): Payer: Medicare Other

## 2017-12-25 DIAGNOSIS — Z794 Long term (current) use of insulin: Secondary | ICD-10-CM

## 2017-12-25 DIAGNOSIS — E1165 Type 2 diabetes mellitus with hyperglycemia: Secondary | ICD-10-CM | POA: Diagnosis not present

## 2017-12-25 LAB — COMPREHENSIVE METABOLIC PANEL
ALBUMIN: 3.8 g/dL (ref 3.5–5.2)
ALK PHOS: 67 U/L (ref 39–117)
ALT: 21 U/L (ref 0–35)
AST: 22 U/L (ref 0–37)
BILIRUBIN TOTAL: 0.8 mg/dL (ref 0.2–1.2)
BUN: 28 mg/dL — ABNORMAL HIGH (ref 6–23)
CALCIUM: 9.1 mg/dL (ref 8.4–10.5)
CO2: 23 mEq/L (ref 19–32)
Chloride: 103 mEq/L (ref 96–112)
Creatinine, Ser: 1.27 mg/dL — ABNORMAL HIGH (ref 0.40–1.20)
GFR: 42.92 mL/min — AB (ref >60.00)
GLUCOSE: 124 mg/dL — AB (ref 70–99)
Potassium: 5 mEq/L (ref 3.5–5.1)
Sodium: 134 mEq/L — ABNORMAL LOW (ref 135–145)
TOTAL PROTEIN: 7.2 g/dL (ref 6.0–8.3)

## 2017-12-25 LAB — HEMOGLOBIN A1C: HEMOGLOBIN A1C: 6.8 % — AB (ref 4.6–6.5)

## 2017-12-27 ENCOUNTER — Other Ambulatory Visit: Payer: Medicare Other

## 2017-12-30 ENCOUNTER — Encounter: Payer: Self-pay | Admitting: Endocrinology

## 2017-12-30 ENCOUNTER — Ambulatory Visit: Payer: Medicare Other | Admitting: Endocrinology

## 2017-12-30 VITALS — BP 132/84 | HR 80 | Ht 64.0 in | Wt 272.8 lb

## 2017-12-30 DIAGNOSIS — E1165 Type 2 diabetes mellitus with hyperglycemia: Secondary | ICD-10-CM

## 2017-12-30 DIAGNOSIS — Z794 Long term (current) use of insulin: Secondary | ICD-10-CM | POA: Diagnosis not present

## 2017-12-30 DIAGNOSIS — E875 Hyperkalemia: Secondary | ICD-10-CM

## 2017-12-30 NOTE — Progress Notes (Signed)
Patient ID: Katelyn Mahoney, female   DOB: 1934/10/22, 83 y.o.   MRN: [id number]            Reason for Appointment: Follow-up for Type 2 Diabetes  Referring physician: Etter Sjogren   History of Present Illness:          Date of diagnosis of type 2 diabetes mellitus: ?  1983        Background history:   She had previously been on metformin with fairly good control for some time She was also at some point treated with Janumet Apparently subsequently she could not tolerate metformin and had diarrhea This was switched to glipizide about 2 years ago and she is also being treated with Januvia and Chana Bode was started in 9/17  Review of A1c indicates that her levels have been significantly high since 2016 She was initially seen in consultation on 02/27/17  Recent history:       Non-insulin hypoglycemic drugs the patient is taking are: Ozempic 0.5 mg weekly, glipizide ER 10 mg 2x a day  INSULIN regimen: Toujeo 26 units at bedtime, Humalog 5 units at supper  A1c is now 6.8 compared to as high as 8.1 previously  Current management, blood sugar patterns and problems identified:  She has been doing fairly well with taking her Ozempic weekly  However her weight has gone up 5 pounds  She says she is eating more bread because of restricting potassium in her diet and reducing other foods including greens  Again not able to do much physical activity because of lower extremity problems  She does forget to check her sugars after meals and has only 2 readings at night, highest 179  Occasionally will forget to take her Toujeo in the evening especially if her main meal is at lunchtime.  She takes the fixed amount of 5 units for her supper dose and no adjustments based on what she is eating, usually trying to take this before eating along with her Toujeo  She feels hypoglycemic when her blood sugars are below 100 including in the morning, recently lowest blood sugar is 72        Side  effects from medications have been: Diarrhea from metformin  Compliance with the medical regimen: Fair  Glucose monitoring:  done 1 +  times a day         Glucometer: One Touch Verio       Blood Glucose readings by time of day by   Mean values apply above for all meters except median for One Touch  PRE-MEAL Fasting Lunch Dinner Bedtime Overall  Glucose range: 72-1 40   138, 149  135, 179    Mean/median: 92     121     Self-care:  Meal times are:  Breakfast is at 9-10 AM Typical meal intake: Breakfast is frequently cereal.  Lunch is variable or nothing.  Meals are usually chicken, salad, fish.  Snacks: Peanuts, yogurt                Dietician visit, most recent: 3/17                Exercise:  None, not able to do much Physical activity  Weight history: Previous range 249-305  Wt Readings from Last 3 Encounters:  12/30/17 272 lb 12.8 oz (123.7 kg)  09/23/17 267 lb 6.4 oz (121.3 kg)  08/19/17 270 lb (122.5 kg)    Glycemic control:   Lab Results  Component Value Date  HGBA1C 6.8 (H) 12/25/2017   HGBA1C 7.5 (H) 08/08/2017   HGBA1C 8.1 (H) 05/13/2017   Lab Results  Component Value Date   MICROALBUR 2.1 (H) 06/20/2017   LDLCALC 73 08/19/2017   CREATININE 1.27 (H) 12/25/2017   Lab Results  Component Value Date   MICRALBCREAT 2.5 06/20/2017   Lab Results  Component Value Date   FRUCTOSAMINE 301 (H) 09/19/2017   FRUCTOSAMINE 312 (H) 06/20/2017   FRUCTOSAMINE 339 (H) 03/27/2017       Allergies as of 12/30/2017      Reactions   Bupropion Hcl    Palmar rash   Penicillins    rash   Citalopram Hydrobromide Other (See Comments)   Unknown   Metformin And Related Diarrhea      Medication List        Accurate as of 12/30/17  9:38 AM. Always use your most recent med list.          amLODipine 5 MG tablet Commonly known as:  NORVASC Take 1 tablet (5 mg total) by mouth daily.   aspirin 81 MG tablet Take 81 mg by mouth. 3 by mouth in the evening     atorvastatin 20 MG tablet Commonly known as:  LIPITOR TAKE ONE-HALF (1/2) TABLET ON MONDAY, WEDNESDAY, FRIDAY AND SUNDAY   BILBERRY PO Take 470 mg by mouth. 2 by mouth daily   CLARITIN 10 MG Caps Generic drug:  Loratadine Take 1 capsule by mouth daily as needed.   FLECTOR 1.3 % Ptch Generic drug:  diclofenac APPLY 1 PATCH ONTO THE SKIN TWICE A DAY   fluocinonide cream 0.05 % Commonly known as:  LIDEX Apply 1 application topically 2 (two) times daily.   furosemide 20 MG tablet Commonly known as:  LASIX TAKE 1 TABLET DAILY   glipiZIDE 10 MG 24 hr tablet Commonly known as:  GLUCOTROL XL Take 1 tablet (10 mg total) by mouth 2 (two) times daily.   GLUCOSAMINE PO Take by mouth daily.   glucose blood test strip Commonly known as:  ONETOUCH VERIO Use to test blood sugar twice a day.  DX: E11.21   insulin degludec 100 UNIT/ML Sopn FlexTouch Pen Commonly known as:  TRESIBA FLEXTOUCH Inject 0.26 mLs (26 Units total) into the skin daily.   insulin lispro 100 UNIT/ML KiwkPen Commonly known as:  HUMALOG KWIKPEN Inject 5 units at meal time   Insulin Pen Needle 29G X 5MM Misc Use to inject insulin into skin 1-3 times daily   Insulin Pen Needle 32G X 4 MM Misc Commonly known as:  BD PEN NEEDLE NANO U/F USE TO INJECT INSULIN INTO THE SKIN 1-3 TIMES DAILY   metoprolol tartrate 100 MG tablet Commonly known as:  LOPRESSOR Take 1 tablet (100 mg total) by mouth 2 (two) times daily.   metoprolol tartrate 100 MG tablet Commonly known as:  LOPRESSOR TAKE 1 TABLET TWICE A DAY   multivitamin-lutein Caps capsule Take 1 capsule by mouth daily.   HAIR/SKIN/NAILS Tabs TAKE 1 TABLET DAILY   nystatin ointment Commonly known as:  MYCOSTATIN Apply 1 application topically 2 (two) times daily.   ONETOUCH DELICA LANCETS 75Z Misc Use to test blood sugar once daily- E11.21   Semaglutide 0.25 or 0.5 MG/DOSE Sopn Commonly known as:  OZEMPIC Inject 0.5 mg into the skin once a week.    OZEMPIC 0.25 or 0.5 MG/DOSE Sopn Generic drug:  Semaglutide INJECT 0.5 MG INTO THE SKIN ONCE WEEKLY   sertraline 100 MG tablet Commonly known as:  ZOLOFT Take 1 tablet (100 mg total) by mouth daily.   sertraline 100 MG tablet Commonly known as:  ZOLOFT TAKE 1 TABLET DAILY   TYLENOL ARTHRITIS PAIN 650 MG CR tablet Generic drug:  acetaminophen 2 by mouth once daily as needed       Allergies:  Allergies  Allergen Reactions  . Bupropion Hcl     Palmar rash  . Penicillins     rash  . Citalopram Hydrobromide Other (See Comments)    Unknown  . Metformin And Related Diarrhea    Past Medical History:  Diagnosis Date  . Allergy   . Anxiety   . Arthritis   . Arthritis   . Callus   . Depression   . Diabetes mellitus without complication ([place])   . Hyperlipidemia   . Hypertension     Past Surgical History:  Procedure Laterality Date  . ABDOMINAL HYSTERECTOMY    . CHOLECYSTECTOMY    . SPLENECTOMY      Family History  Problem Relation Age of Onset  . Cancer Mother   . Cancer Father   . Cancer Sister   . Cancer Brother     Social History:  reports that  has never smoked. she has never used smokeless tobacco. She reports that she does not drink alcohol or use drugs.   Review of Systems   Lipid history: LDL controlled with 20 mg Lipitor    Lab Results  Component Value Date   CHOL 147 08/19/2017   HDL 51.80 08/19/2017   LDLCALC 73 08/19/2017   LDLDIRECT 78.3 04/16/2013   TRIG 110.0 08/19/2017   CHOLHDL 3 08/19/2017           Hypertension: Currently on  Metoprolol and amlodipine, was recommended lower dose of metoprolol because of hyperkalemia   CKD: Mild, creatinine probably improved with stopping losartan This is followed by nephrologist   HYPERKALEMIA: Resolved Has been given low potassium diet by nephrologist     Lab Results  Component Value Date   CREATININE 1.27 (H) 12/25/2017   CREATININE 1.27 (H) 09/19/2017   CREATININE 1.30 (H)  08/19/2017   CREATININE 1.49 (H) 08/08/2017   Lab Results  Component Value Date   K 5.0 12/25/2017    Most recent foot exam:01/2017 Also followed by podiatrist   LABS:  Lab on 12/25/2017  Component Date Value Ref Range Status  . Sodium 12/25/2017 134* 135 - 145 mEq/L Final  . Potassium 12/25/2017 5.0  3.5 - 5.1 mEq/L Final  . Chloride 12/25/2017 103  96 - 112 mEq/L Final  . CO2 12/25/2017 23  19 - 32 mEq/L Final  . Glucose, Bld 12/25/2017 124* 70 - 99 mg/dL Final  . BUN 12/25/2017 28* 6 - 23 mg/dL Final  . Creatinine, Ser 12/25/2017 1.27* 0.40 - 1.20 mg/dL Final  . Total Bilirubin 12/25/2017 0.8  0.2 - 1.2 mg/dL Final  . Alkaline Phosphatase 12/25/2017 67  39 - 117 U/L Final  . AST 12/25/2017 22  0 - 37 U/L Final  . ALT 12/25/2017 21  0 - 35 U/L Final  . Total Protein 12/25/2017 7.2  6.0 - 8.3 g/dL Final  . Albumin 12/25/2017 3.8  3.5 - 5.2 g/dL Final  . Calcium 12/25/2017 9.1  8.4 - 10.5 mg/dL Final  . GFR 12/25/2017 42.92* >60.00 mL/min Final  . Hgb A1c MFr Bld 12/25/2017 6.8* 4.6 - 6.5 % Final   Glycemic Control Guidelines for People with Diabetes:Non Diabetic:  <6%Goal of Therapy: <7%Additional Action Suggested:  >  8%     Physical Examination:  BP 132/84   Pulse 80   Ht 5\' 4"  (1.626 m)   Wt 272 lb 12.8 oz (123.7 kg)   SpO2 98%   BMI 46.83 kg/m      Her feet are normal to inspection including plantar surfaces No ankle edema  ASSESSMENT:  Diabetes type 2, uncontrolled With morbid obesity, insulin requiring  See history of present illness for detailed discussion of current diabetes management, blood sugar patterns and problems identified   Her A1c is down below 7% now at 6.8  Even though she has been on the Ozempic over 3 months her weight has not come down This is likely to be from her not exercising as also following a relatively high carbohydrate diet recently She does not check readings after meals and this is important to help her adjust her diet and  insulin, this was discussed in detail  FASTING blood sugars are overall low normal although slightly higher the last few days Since she is symptomatic with blood sugars below 100 she likely is getting excessive amounts of basal insulin  HYPERKALEMIA: This is improved with dietary restrictions, also followed by nephrologist Previously also her metoprolol has been reduced and losartan stopped  HYPERTENSION: Appears well-controlled, also followed by other physicians  ?  Hypercalcemia: She was told by nephrologist the calcium was high and this was probably transient, this is normal now she does not need to restrict dairy products  PLAN:    She does need to check blood sugars more consistently after meals especially in the evening, may check at bedtime  To try to be consistent with taking her Toujeo every evening  For now she will reduce her Toujeo down to 24 units but if morning sugars stay under 100 she will cut it down to 22  If her meal is larger at lunchtime she can take Humalog at that time and not at dinner   continue same dose of Ozempic, may consider higher doses of this if we need to get her weight down further  However she needs to be seen by dietitian to help with her weight loss  She does need to try and focus on blood sugars after supper at night more often and sometimes after lunch if she is eating out, discussed blood sugar targets  Given her chair exercises to do since she cannot do any walking  Discussed that she can cut back on her carbohydrate and have more balanced meals instead of eating more bread for her hyperkalemia.  This will be evaluated by a dietitian also     Counseling time on subjects discussed in assessment and plan sections is over 50% of today's 25 minute visit    Patient Instructions  Toujeo 24 units and down to 22 if am sugars stay < 100  More SUGARS AFTER DINNER      Elayne Snare 12/30/2017, 9:38 AM   Note: This office note was prepared  with Dragon voice recognition system technology. Any transcriptional errors that result from this process are unintentional.

## 2017-12-30 NOTE — Patient Instructions (Addendum)
Toujeo 24 units and down to 22 if am sugars stay < 100  More SUGARS AFTER DINNER

## 2018-01-07 ENCOUNTER — Other Ambulatory Visit: Payer: Self-pay | Admitting: Endocrinology

## 2018-01-10 ENCOUNTER — Encounter: Payer: Medicare Other | Attending: Endocrinology | Admitting: Dietician

## 2018-01-10 ENCOUNTER — Encounter: Payer: Self-pay | Admitting: Dietician

## 2018-01-10 DIAGNOSIS — N183 Chronic kidney disease, stage 3 (moderate): Secondary | ICD-10-CM

## 2018-01-10 DIAGNOSIS — IMO0002 Reserved for concepts with insufficient information to code with codable children: Secondary | ICD-10-CM

## 2018-01-10 DIAGNOSIS — N1832 Chronic kidney disease, stage 3b: Secondary | ICD-10-CM

## 2018-01-10 DIAGNOSIS — Z794 Long term (current) use of insulin: Secondary | ICD-10-CM | POA: Insufficient documentation

## 2018-01-10 DIAGNOSIS — Z713 Dietary counseling and surveillance: Secondary | ICD-10-CM | POA: Diagnosis not present

## 2018-01-10 DIAGNOSIS — E1151 Type 2 diabetes mellitus with diabetic peripheral angiopathy without gangrene: Secondary | ICD-10-CM

## 2018-01-10 DIAGNOSIS — E1165 Type 2 diabetes mellitus with hyperglycemia: Secondary | ICD-10-CM | POA: Diagnosis not present

## 2018-01-10 NOTE — Patient Instructions (Signed)
Continue the arm chair exercises most days. Continue following a low sodium and low potassium diet. Consider less bread. Eat breakfast, lunch, dinner daily.  Breakfast:   Oatmeal, handful of nuts, fruit   Swiss cheese on white toast, fruit   Cornflakes, 1/2 cup berries, 1/2 cup milk  Lunch:   Vegetable Soup, unsalted crackers 1 ounce swiss cheese, bread   Tuna or chicken or boars head LS [place] sandwich with lite mayo, fruit, carrots   Leftovers  Dinner:   3 ounces chicken or fish, peas or corn or 1/3 cup rice or pasta, vegetable, fruit   Vegetable plate with dry beans or cottage cheese  Check your blood sugar more often after your meals.

## 2018-01-10 NOTE — Progress Notes (Signed)
Diabetes Self-Management Education  Visit Type: Follow-up  Appt. Start Time: 1430 Appt. End Time: 5102  01/10/2018  [name], identified by name and date of birth, is a 84 y.o. female with a diagnosis of Diabetes:  Type 2  .  Other history includes CKD with GFR of 42.9 12/25/17.  Other labs include Potassium 5.0, BUN 28, Creatinine 1.27.  She is known to me from previous visits in 04/09/2023 and April of 2008. Patient of Dr. Dwyane Dee.  Referred for weight loss and hyperkalemia. Medications include:  Glipizide, Ozempic, Toujeo 24 units q HS.  She now takes the long acting insulin after dinner so she remembers to take this.  She takes 5 units of Humalog before lunch or supper depending on what is the largest meal.  She takes Lasix only when she is not leaving the house.  Weight hx: 160 lbs around 84 yo but regained after fracturing her ankle 264 lbs 04/19/17 276 lbs today "eating too much bread because I don't know what I can eat because of my high potassium" 305 lbs highest weight She has been on Weight [employer]chers in the past.  Patient's grandson lives with her.  He does the shopping and some of the cooking.  She has begun doing the armchair exercise.  She states that she now only eats out on Wednesday's with friends.  Her husband died in 04-08-01.  She is a retired Theatre manager.  ASSESSMENT  Height 5\' 9"  (1.753 m), weight 276 lb (125.2 kg). Body mass index is 40.76 kg/m.  Diabetes Self-Management Education - 01/10/18 1452      Visit Information   Visit Type  Follow-up      Psychosocial Assessment   Patient Belief/Attitude about Diabetes  Motivated to manage diabetes    Self-care barriers  Debilitated state due to current medical condition    Self-management support  Doctor's office;Family    Other persons present  Patient    Patient Concerns  Nutrition/Meal planning;Weight Control    Special Needs  None    Preferred Learning Style  No preference indicated    Learning  Readiness  Ready    How often do you need to have someone help you when you read instructions, pamphlets, or other written materials from your doctor or pharmacy?  1 - Never      Pre-Education Assessment   Patient understands the diabetes disease and treatment process.  Needs Review    Patient understands incorporating nutritional management into lifestyle.  Needs Review    Patient undertands incorporating physical activity into lifestyle.  Needs Review    Patient understands using medications safely.  Needs Review    Patient understands monitoring blood glucose, interpreting and using results  Needs Review    Patient understands prevention, detection, and treatment of acute complications.  Needs Review    Patient understands prevention, detection, and treatment of chronic complications.  Needs Review    Patient understands how to develop strategies to address psychosocial issues.  Needs Review    Patient understands how to develop strategies to promote health/change behavior.  Needs Review      Complications   Last HgB A1C per patient/outside source  6.8 % 12/25/17    How often do you check your blood sugar?  1-2 times/day    Fasting Blood glucose range (mg/dL)  70-129    Postprandial Blood glucose range (mg/dL)  180-200    Number of hypoglycemic episodes per month  0    Have you had  a dilated eye exam in the past 12 months?  Yes    Have you had a dental exam in the past 12 months?  Yes    Are you checking your feet?  Yes    How many days per week are you checking your feet?  1      Dietary Intake   Breakfast  2 slices white toast with butter OR cornflakes and milk OR rare sausage and toast sandwich OR SKIPS 8-10    Snack (morning)  none    Lunch  SKIPS if breakfast  11-12    Snack (afternoon)  canned pears, almonds    Dinner  vegetables, 2 ounces chicken or none, 2 slices bread or 3-4 slices if eaeting a sandwich 5:30-6    Snack (evening)  almonds    Beverage(s)  water, decaf black  coffee (8 cups per day), water (48 ounces per day), occasional decaf unsweetened tea      Exercise   Exercise Type  Light (walking / raking leaves) armchair exercise    How many days per week to you exercise?  7    How many minutes per day do you exercise?  30    Total minutes per week of exercise  210      Patient Education   Previous Diabetes Education  Yes (please comment) last seen by me 04/29/17    Nutrition management   Role of diet in the treatment of diabetes and the relationship between the three main macronutrients and blood glucose level;Other (comment) low sodium and low potassium    Physical activity and exercise   Role of exercise on diabetes management, blood pressure control and cardiac health.;Helped patient identify appropriate exercises in relation to his/her diabetes, diabetes complications and other health issue.    Medications  Reviewed patients medication for diabetes, action, purpose, timing of dose and side effects.    Monitoring  Purpose and frequency of SMBG.    Acute complications  Taught treatment of hypoglycemia - the 15 rule.    Psychosocial adjustment  Worked with patient to identify barriers to care and solutions    Personal strategies to promote health  Lifestyle issues that need to be addressed for better diabetes care      Individualized Goals (developed by patient)   Nutrition  General guidelines for healthy choices and portions discussed    Physical Activity  Exercise 5-7 days per week;30 minutes per day    Medications  take my medication as prescribed    Monitoring   test my blood glucose as discussed    Problem Solving  taking after supper blood sugar more frequently    Reducing Risk  examine blood glucose patterns;get labs drawn    Health Coping  discuss diabetes with (comment) MD,RD,CDE      Patient Self-Evaluation of Goals - Patient rates self as meeting previously set goals (% of time)   Nutrition  >75%    Medications  >75%    Monitoring  50 -  75 %    Problem Solving  >75%    Reducing Risk  >75%    Health Coping  >75%      Post-Education Assessment   Patient understands the diabetes disease and treatment process.  Demonstrates understanding / competency    Patient understands incorporating nutritional management into lifestyle.  Demonstrates understanding / competency    Patient undertands incorporating physical activity into lifestyle.  Demonstrates understanding / competency    Patient understands using medications safely.  Demonstrates understanding / competency    Patient understands monitoring blood glucose, interpreting and using results  Demonstrates understanding / competency    Patient understands prevention, detection, and treatment of acute complications.  Demonstrates understanding / competency    Patient understands prevention, detection, and treatment of chronic complications.  Demonstrates understanding / competency    Patient understands how to develop strategies to address psychosocial issues.  Demonstrates understanding / competency    Patient understands how to develop strategies to promote health/change behavior.  Demonstrates understanding / competency      Outcomes   Expected Outcomes  Demonstrated interest in learning. Expect positive outcomes    Future DMSE  PRN    Program Status  Completed      Subsequent Visit   Since your last visit have you continued or begun to take your medications as prescribed?  Yes       Individualized Plan for Diabetes Self-Management Training:   Learning Objective:  Patient will have a greater understanding of diabetes self-management. Patient education plan is to attend individual and/or group sessions per assessed needs and concerns.   Plan:   Patient Instructions  Continue the arm chair exercises most days. Continue following a low sodium and low potassium diet. Consider less bread. Eat breakfast, lunch, dinner daily.  Breakfast:   Oatmeal, handful of nuts,  fruit   Swiss cheese on white toast, fruit   Cornflakes, 1/2 cup berries, 1/2 cup milk  Lunch:   Vegetable Soup, unsalted crackers 1 ounce swiss cheese, bread   Tuna or chicken or boars head LS [place] sandwich with lite mayo, fruit, carrots   Leftovers  Dinner:   3 ounces chicken or fish, peas or corn or 1/3 cup rice or pasta, vegetable, fruit   Vegetable plate with dry beans or cottage cheese  Check your blood sugar more often after your meals.          Expected Outcomes:  Demonstrated interest in learning. Expect positive outcomes  Education material provided: Meal plan card, Kidney diet pyramid from [employer]  If problems or questions, patient to contact team via:  Phone  Future DSME appointment: PRN

## 2018-01-16 ENCOUNTER — Encounter: Payer: Self-pay | Admitting: Endocrinology

## 2018-01-16 ENCOUNTER — Other Ambulatory Visit: Payer: Self-pay | Admitting: Family Medicine

## 2018-01-16 DIAGNOSIS — I10 Essential (primary) hypertension: Secondary | ICD-10-CM

## 2018-01-27 ENCOUNTER — Encounter: Payer: Self-pay | Admitting: Endocrinology

## 2018-01-28 ENCOUNTER — Other Ambulatory Visit: Payer: Self-pay

## 2018-01-28 ENCOUNTER — Encounter: Payer: Self-pay | Admitting: Endocrinology

## 2018-01-28 DIAGNOSIS — E1151 Type 2 diabetes mellitus with diabetic peripheral angiopathy without gangrene: Secondary | ICD-10-CM

## 2018-01-28 DIAGNOSIS — E1121 Type 2 diabetes mellitus with diabetic nephropathy: Secondary | ICD-10-CM

## 2018-01-28 DIAGNOSIS — E1165 Type 2 diabetes mellitus with hyperglycemia: Principal | ICD-10-CM

## 2018-01-28 DIAGNOSIS — IMO0002 Reserved for concepts with insufficient information to code with codable children: Secondary | ICD-10-CM

## 2018-01-28 MED ORDER — GLIPIZIDE ER 10 MG PO TB24: 10.0000 mg | ORAL_TABLET | Freq: Two times a day (BID) | ORAL | 1 refills | Status: DC

## 2018-01-29 ENCOUNTER — Other Ambulatory Visit: Payer: Self-pay

## 2018-01-29 ENCOUNTER — Other Ambulatory Visit: Payer: Self-pay | Admitting: Family Medicine

## 2018-01-29 DIAGNOSIS — IMO0002 Reserved for concepts with insufficient information to code with codable children: Secondary | ICD-10-CM

## 2018-01-29 DIAGNOSIS — E1165 Type 2 diabetes mellitus with hyperglycemia: Principal | ICD-10-CM

## 2018-01-29 DIAGNOSIS — E1151 Type 2 diabetes mellitus with diabetic peripheral angiopathy without gangrene: Secondary | ICD-10-CM

## 2018-01-29 DIAGNOSIS — E1121 Type 2 diabetes mellitus with diabetic nephropathy: Secondary | ICD-10-CM

## 2018-01-29 MED ORDER — GLIPIZIDE ER 10 MG PO TB24: 10.0000 mg | ORAL_TABLET | Freq: Two times a day (BID) | ORAL | 1 refills | Status: DC

## 2018-02-04 ENCOUNTER — Encounter: Payer: Self-pay | Admitting: Endocrinology

## 2018-02-04 ENCOUNTER — Other Ambulatory Visit: Payer: Self-pay

## 2018-02-04 DIAGNOSIS — E1151 Type 2 diabetes mellitus with diabetic peripheral angiopathy without gangrene: Secondary | ICD-10-CM

## 2018-02-04 DIAGNOSIS — IMO0002 Reserved for concepts with insufficient information to code with codable children: Secondary | ICD-10-CM

## 2018-02-04 DIAGNOSIS — E1165 Type 2 diabetes mellitus with hyperglycemia: Principal | ICD-10-CM

## 2018-02-04 DIAGNOSIS — E1121 Type 2 diabetes mellitus with diabetic nephropathy: Secondary | ICD-10-CM

## 2018-02-04 MED ORDER — GLIPIZIDE ER 10 MG PO TB24: 10.0000 mg | ORAL_TABLET | Freq: Two times a day (BID) | ORAL | 1 refills | Status: DC

## 2018-02-12 ENCOUNTER — Encounter: Payer: Self-pay | Admitting: Endocrinology

## 2018-02-12 ENCOUNTER — Other Ambulatory Visit: Payer: Self-pay

## 2018-02-12 DIAGNOSIS — E1151 Type 2 diabetes mellitus with diabetic peripheral angiopathy without gangrene: Secondary | ICD-10-CM

## 2018-02-12 DIAGNOSIS — IMO0002 Reserved for concepts with insufficient information to code with codable children: Secondary | ICD-10-CM

## 2018-02-12 DIAGNOSIS — E1165 Type 2 diabetes mellitus with hyperglycemia: Principal | ICD-10-CM

## 2018-02-12 DIAGNOSIS — E1121 Type 2 diabetes mellitus with diabetic nephropathy: Secondary | ICD-10-CM

## 2018-02-12 MED ORDER — GLIPIZIDE ER 10 MG PO TB24: 10.0000 mg | ORAL_TABLET | Freq: Two times a day (BID) | ORAL | 1 refills | Status: DC

## 2018-02-20 ENCOUNTER — Encounter: Payer: Self-pay | Admitting: Family Medicine

## 2018-02-20 ENCOUNTER — Ambulatory Visit: Payer: Medicare Other | Admitting: Family Medicine

## 2018-02-20 VITALS — BP 120/78 | HR 76 | Temp 98.0°F | Resp 16 | Ht 69.0 in | Wt 274.2 lb

## 2018-02-20 DIAGNOSIS — E1151 Type 2 diabetes mellitus with diabetic peripheral angiopathy without gangrene: Secondary | ICD-10-CM

## 2018-02-20 DIAGNOSIS — E1165 Type 2 diabetes mellitus with hyperglycemia: Secondary | ICD-10-CM

## 2018-02-20 DIAGNOSIS — E785 Hyperlipidemia, unspecified: Secondary | ICD-10-CM | POA: Diagnosis not present

## 2018-02-20 DIAGNOSIS — E1169 Type 2 diabetes mellitus with other specified complication: Secondary | ICD-10-CM | POA: Diagnosis not present

## 2018-02-20 DIAGNOSIS — I10 Essential (primary) hypertension: Secondary | ICD-10-CM | POA: Diagnosis not present

## 2018-02-20 DIAGNOSIS — IMO0002 Reserved for concepts with insufficient information to code with codable children: Secondary | ICD-10-CM

## 2018-02-20 LAB — COMPREHENSIVE METABOLIC PANEL
ALK PHOS: 68 U/L (ref 39–117)
ALT: 19 U/L (ref 0–35)
AST: 20 U/L (ref 0–37)
Albumin: 3.7 g/dL (ref 3.5–5.2)
BUN: 39 mg/dL — ABNORMAL HIGH (ref 6–23)
CO2: 26 mEq/L (ref 19–32)
Calcium: 10 mg/dL (ref 8.4–10.5)
Chloride: 104 mEq/L (ref 96–112)
Creatinine, Ser: 1.35 mg/dL — ABNORMAL HIGH (ref 0.40–1.20)
GFR: 39.98 mL/min — AB (ref >60.00)
Glucose, Bld: 145 mg/dL — ABNORMAL HIGH (ref 70–99)
POTASSIUM: 4.4 meq/L (ref 3.5–5.1)
Sodium: 137 mEq/L (ref 135–145)
TOTAL PROTEIN: 7.2 g/dL (ref 6.0–8.3)
Total Bilirubin: 0.6 mg/dL (ref 0.2–1.2)

## 2018-02-20 LAB — LIPID PANEL
Cholesterol: 164 mg/dL (ref 0–200)
HDL: 59.3 mg/dL (ref >39.00)
LDL Cholesterol: 74 mg/dL (ref 0–99)
NONHDL: 104.35
Total CHOL/HDL Ratio: 3
Triglycerides: 151 mg/dL — ABNORMAL HIGH (ref 0.0–149.0)
VLDL: 30.2 mg/dL (ref 0.0–40.0)

## 2018-02-20 MED ORDER — NYSTATIN 100000 UNIT/GM EX OINT: 1.0000 "application " | TOPICAL_OINTMENT | Freq: Two times a day (BID) | CUTANEOUS | 6 refills | Status: DC

## 2018-02-20 NOTE — Assessment & Plan Note (Signed)
Well controlled, no changes to meds. Encouraged heart healthy diet such as the DASH diet and exercise as tolerated.  °

## 2018-02-20 NOTE — Patient Instructions (Signed)

## 2018-02-20 NOTE — Progress Notes (Signed)
Patient ID: Katelyn Mahoney, female    DOB: 1934-10-30  Age: 84 y.o. MRN: [id number]    Subjective:  Subjective  HPI Katelyn Mahoney presents for f/u bp and cholesterol.  No complaints.  She needs refills.    Review of Systems  Constitutional: Negative for activity change, appetite change, diaphoresis, fatigue and unexpected weight change.  Eyes: Negative for pain, redness and visual disturbance.  Respiratory: Negative for cough, chest tightness, shortness of breath and wheezing.   Cardiovascular: Negative for chest pain, palpitations and leg swelling.  Endocrine: Negative for cold intolerance, heat intolerance, polydipsia, polyphagia and polyuria.  Genitourinary: Negative for difficulty urinating, dysuria and frequency.  Neurological: Negative for dizziness, light-headedness, numbness and headaches.  Psychiatric/Behavioral: Negative for behavioral problems and dysphoric mood. The patient is not nervous/anxious.     History Past Medical History:  Diagnosis Date  . Allergy   . Anxiety   . Arthritis   . Arthritis   . Callus   . Depression   . Diabetes mellitus without complication ([place])   . Hyperlipidemia   . Hypertension     She has a past surgical history that includes Cholecystectomy; Abdominal hysterectomy; and Splenectomy.   Her family history includes Cancer in her brother, father, mother, and sister.She reports that  has never smoked. she has never used smokeless tobacco. She reports that she does not drink alcohol or use drugs.  Current Outpatient Medications on File Prior to Visit  Medication Sig Dispense Refill  . acetaminophen (TYLENOL ARTHRITIS PAIN) 650 MG CR tablet 2 by mouth once daily as needed    . amLODipine (NORVASC) 5 MG tablet Take 1 tablet (5 mg total) by mouth daily. 30 tablet 5  . aspirin 81 MG tablet Take 81 mg by mouth. 3 by mouth in the evening    . atorvastatin (LIPITOR) 20 MG tablet TAKE ONE-HALF (1/2) TABLET ON MONDAY, WEDNESDAY, FRIDAY AND  SUNDAY 26 tablet 3  . Bilberry, Vaccinium myrtillus, (BILBERRY PO) Take 470 mg by mouth. 2 by mouth daily    . FLECTOR 1.3 % PTCH APPLY 1 PATCH ONTO THE SKIN TWICE A DAY (Patient taking differently: APPLY 1 PATCH ONTO THE SKIN TWICE A DAY PRN) 30 patch 2  . fluocinonide cream (LIDEX) 9.24 % Apply 1 application topically 2 (two) times daily.    . furosemide (LASIX) 20 MG tablet TAKE 1 TABLET DAILY 90 tablet 1  . glipiZIDE (GLUCOTROL XL) 10 MG 24 hr tablet Take 1 tablet (10 mg total) by mouth 2 (two) times daily. 180 tablet 1  . Glucosamine HCl (GLUCOSAMINE PO) Take by mouth daily.    Marland Kitchen glucose blood (ONETOUCH VERIO) test strip Use to test blood sugar twice a day.  DX: E11.21 100 each 3  . insulin degludec (TRESIBA FLEXTOUCH) 100 UNIT/ML SOPN FlexTouch Pen Inject 0.26 mLs (26 Units total) into the skin daily. (Patient taking differently: Inject 24 Units into the skin daily. ) 10 pen 1  . insulin lispro (HUMALOG KWIKPEN) 100 UNIT/ML KiwkPen Inject 5 units at meal time 15 pen 3  . Insulin Pen Needle (BD PEN NEEDLE NANO U/F) 32G X 4 MM MISC USE TO INJECT INSULIN INTO THE SKIN 1-3 TIMES DAILY 100 each 0  . Insulin Pen Needle (BD PEN NEEDLE NANO U/F) 32G X 4 MM MISC USE TO INJECT INSULIN INTO THE SKIN 1-3 TIMES DAILY 100 each 3  . Insulin Pen Needle 29G X 5MM MISC Use to inject insulin into skin 1-3 times daily 100 each 2  .  Loratadine (CLARITIN) 10 MG CAPS Take 1 capsule by mouth daily as needed.     . metoprolol tartrate (LOPRESSOR) 100 MG tablet Take 1 tablet (100 mg total) by mouth 2 (two) times daily. (Patient taking differently: Take 50 mg by mouth 2 (two) times daily. ) 180 tablet 0  . Multiple Vitamins-Minerals (HAIR/SKIN/NAILS) TABS TAKE 1 TABLET DAILY 90 tablet 2  . multivitamin-lutein (OCUVITE-LUTEIN) CAPS capsule Take 1 capsule by mouth daily. 90 capsule 3  . ONETOUCH DELICA LANCETS 01X MISC Use to test blood sugar once daily- E11.21 100 each 3  . OZEMPIC 0.25 or 0.5 MG/DOSE SOPN INJECT 0.5 MG  INTO THE SKIN ONCE WEEKLY 1.5 mL 3  . Semaglutide (OZEMPIC) 0.25 or 0.5 MG/DOSE SOPN Inject 0.5 mg into the skin once a week. 1 pen 2  . sertraline (ZOLOFT) 100 MG tablet Take 1 tablet (100 mg total) by mouth daily. 90 tablet 1  . sertraline (ZOLOFT) 100 MG tablet TAKE 1 TABLET DAILY 90 tablet 0   No current facility-administered medications on file prior to visit.      Objective:  Objective  Physical Exam  Constitutional: She is oriented to person, place, and time. She appears well-developed and well-nourished.  HENT:  Head: Normocephalic and atraumatic.  Eyes: Conjunctivae and EOM are normal.  Neck: Normal range of motion. Neck supple. No JVD present. Carotid bruit is not present. No thyromegaly present.  Cardiovascular: Normal rate, regular rhythm and normal heart sounds.  No murmur heard. Pulmonary/Chest: Effort normal and breath sounds normal. No respiratory distress. She has no wheezes. She has no rales. She exhibits no tenderness.  Musculoskeletal: She exhibits no edema.  Neurological: She is alert and oriented to person, place, and time.  Psychiatric: She has a normal mood and affect. Her behavior is normal. Judgment and thought content normal.  Nursing note and vitals reviewed.  BP 120/78 (BP Location: Right Arm, Cuff Size: Large)   Pulse 76   Temp 98 F (36.7 C) (Oral)   Resp 16   Ht 5\' 9"  (1.753 m)   Wt 274 lb 3.2 oz (124.4 kg)   SpO2 98%   BMI 40.49 kg/m  Wt Readings from Last 3 Encounters:  02/20/18 274 lb 3.2 oz (124.4 kg)  01/10/18 276 lb (125.2 kg)  12/30/17 272 lb 12.8 oz (123.7 kg)     Lab Results  Component Value Date   WBC 14.3 (H) 05/03/2016   HGB 15.5 05/03/2016   HCT 43.2 05/03/2016   PLT 322 05/03/2016   GLUCOSE 124 (H) 12/25/2017   CHOL 147 08/19/2017   TRIG 110.0 08/19/2017   HDL 51.80 08/19/2017   LDLDIRECT 78.3 04/16/2013   LDLCALC 73 08/19/2017   ALT 21 12/25/2017   AST 22 12/25/2017   NA 134 (L) 12/25/2017   K 5.0 12/25/2017   CL  103 12/25/2017   CREATININE 1.27 (H) 12/25/2017   BUN 28 (H) 12/25/2017   CO2 23 12/25/2017   TSH 1.28 07/07/2015   HGBA1C 6.8 (H) 12/25/2017   MICROALBUR 2.1 (H) 06/20/2017    No results found.   Assessment & Plan:  Plan  I am having Katelyn Mahoney maintain her aspirin, acetaminophen, (Bilberry, Vaccinium myrtillus, (BILBERRY PO)), multivitamin-lutein, FLECTOR, Loratadine, Glucosamine HCl (GLUCOSAMINE PO), fluocinonide cream, ONETOUCH DELICA LANCETS 79T, glucose blood, Semaglutide, insulin lispro, Insulin Pen Needle, metoprolol tartrate, sertraline, insulin degludec, atorvastatin, OZEMPIC, furosemide, Insulin Pen Needle, amLODipine, Insulin Pen Needle, sertraline, HAIR/SKIN/NAILS, glipiZIDE, and nystatin ointment.  Meds ordered this encounter  Medications  .  nystatin ointment (MYCOSTATIN)    Sig: Apply 1 application topically 2 (two) times daily.    Dispense:  30 g    Refill:  6    Problem List Items Addressed This Visit      Unprioritized   DM (diabetes mellitus) type II uncontrolled, periph vascular disorder ([place])    Per endo      Essential hypertension    Well controlled, no changes to meds. Encouraged heart healthy diet such as the DASH diet and exercise as tolerated.       Relevant Medications   nystatin ointment (MYCOSTATIN)   Other Relevant Orders   Lipid panel   Comprehensive metabolic panel   Hyperlipidemia associated with type 2 diabetes mellitus ([place])    Tolerating statin, encouraged heart healthy diet, avoid trans fats, minimize simple carbs and saturated fats. Increase exercise as tolerated       Other Visit Diagnoses    Hyperlipidemia, unspecified hyperlipidemia type    -  Primary   Relevant Medications   nystatin ointment (MYCOSTATIN)   Other Relevant Orders   Lipid panel   Comprehensive metabolic panel      Follow-up: Return in about 6 months (around 08/20/2018), or if symptoms worsen or fail to improve, for annual exam, fasting.  Ann Held, DO

## 2018-02-20 NOTE — Assessment & Plan Note (Signed)
Tolerating statin, encouraged heart healthy diet, avoid trans fats, minimize simple carbs and saturated fats. Increase exercise as tolerated 

## 2018-02-20 NOTE — Assessment & Plan Note (Signed)
Per endo °

## 2018-02-24 ENCOUNTER — Other Ambulatory Visit: Payer: Self-pay | Admitting: *Deleted

## 2018-02-24 ENCOUNTER — Encounter: Payer: Self-pay | Admitting: *Deleted

## 2018-02-24 DIAGNOSIS — E785 Hyperlipidemia, unspecified: Secondary | ICD-10-CM

## 2018-02-24 DIAGNOSIS — E1165 Type 2 diabetes mellitus with hyperglycemia: Principal | ICD-10-CM

## 2018-02-24 DIAGNOSIS — I10 Essential (primary) hypertension: Secondary | ICD-10-CM

## 2018-02-24 DIAGNOSIS — E1151 Type 2 diabetes mellitus with diabetic peripheral angiopathy without gangrene: Secondary | ICD-10-CM

## 2018-02-24 DIAGNOSIS — E1169 Type 2 diabetes mellitus with other specified complication: Secondary | ICD-10-CM

## 2018-02-24 DIAGNOSIS — IMO0002 Reserved for concepts with insufficient information to code with codable children: Secondary | ICD-10-CM

## 2018-03-04 ENCOUNTER — Other Ambulatory Visit: Payer: Self-pay | Admitting: Endocrinology

## 2018-03-10 ENCOUNTER — Ambulatory Visit: Payer: Medicare Other | Admitting: Endocrinology

## 2018-03-11 ENCOUNTER — Encounter: Payer: Self-pay | Admitting: Endocrinology

## 2018-03-11 ENCOUNTER — Encounter: Payer: Self-pay | Admitting: Family Medicine

## 2018-03-22 ENCOUNTER — Other Ambulatory Visit: Payer: Self-pay | Admitting: Endocrinology

## 2018-03-27 ENCOUNTER — Other Ambulatory Visit (INDEPENDENT_AMBULATORY_CARE_PROVIDER_SITE_OTHER): Payer: Medicare Other

## 2018-03-27 DIAGNOSIS — Z794 Long term (current) use of insulin: Secondary | ICD-10-CM | POA: Diagnosis not present

## 2018-03-27 DIAGNOSIS — E1165 Type 2 diabetes mellitus with hyperglycemia: Secondary | ICD-10-CM | POA: Diagnosis not present

## 2018-03-27 LAB — COMPREHENSIVE METABOLIC PANEL
ALBUMIN: 3.4 g/dL — AB (ref 3.5–5.2)
ALK PHOS: 66 U/L (ref 39–117)
ALT: 24 U/L (ref 0–35)
AST: 22 U/L (ref 0–37)
BUN: 29 mg/dL — AB (ref 6–23)
CO2: 28 mEq/L (ref 19–32)
CREATININE: 1.25 mg/dL — AB (ref 0.40–1.20)
Calcium: 9.4 mg/dL (ref 8.4–10.5)
Chloride: 103 mEq/L (ref 96–112)
GFR: 43.68 mL/min — ABNORMAL LOW (ref >60.00)
Glucose, Bld: 208 mg/dL — ABNORMAL HIGH (ref 70–99)
Potassium: 5.3 mEq/L — ABNORMAL HIGH (ref 3.5–5.1)
SODIUM: 137 meq/L (ref 135–145)
TOTAL PROTEIN: 6.9 g/dL (ref 6.0–8.3)
Total Bilirubin: 0.6 mg/dL (ref 0.2–1.2)

## 2018-03-27 LAB — HEMOGLOBIN A1C: Hgb A1c MFr Bld: 7.1 % — ABNORMAL HIGH (ref 4.6–6.5)

## 2018-04-01 ENCOUNTER — Encounter: Payer: Self-pay | Admitting: Endocrinology

## 2018-04-01 ENCOUNTER — Ambulatory Visit: Payer: Medicare Other | Admitting: Endocrinology

## 2018-04-01 VITALS — BP 128/78 | HR 70 | Ht 69.0 in | Wt 274.0 lb

## 2018-04-01 DIAGNOSIS — E782 Mixed hyperlipidemia: Secondary | ICD-10-CM | POA: Diagnosis not present

## 2018-04-01 DIAGNOSIS — Z794 Long term (current) use of insulin: Secondary | ICD-10-CM

## 2018-04-01 DIAGNOSIS — E1165 Type 2 diabetes mellitus with hyperglycemia: Secondary | ICD-10-CM | POA: Diagnosis not present

## 2018-04-01 DIAGNOSIS — E875 Hyperkalemia: Secondary | ICD-10-CM

## 2018-04-01 MED ORDER — AMLODIPINE BESYLATE 5 MG PO TABS: 5.0000 mg | ORAL_TABLET | Freq: Every day | ORAL | 2 refills | Status: DC

## 2018-04-01 NOTE — Progress Notes (Signed)
Patient ID: Katelyn Mahoney, female   DOB: Nov 03, 1934, 84 y.o.   MRN: [id number]            Reason for Appointment: Follow-up for Type 2 Diabetes  Referring physician: Etter Sjogren   History of Present Illness:          Date of diagnosis of type 2 diabetes mellitus: ?  1983        Background history:   She had previously been on metformin with fairly good control for some time She was also at some point treated with Janumet Apparently subsequently she could not tolerate metformin and had diarrhea This was switched to glipizide about 2 years ago and she is also being treated with Januvia and Chana Bode was started in 9/17  Review of A1c indicates that her levels have been significantly high since 2016 She was initially seen in consultation on 02/27/17  Recent history:       Non-insulin hypoglycemic drugs the patient is taking are: Ozempic 0.5 mg weekly, glipizide ER 10 mg 2x a day  INSULIN regimen: Tresiba 24 units at bedtime, Humalog 5 units at supper  A1c is now 7.1 compared to as high as 8.1 previously  Current management, blood sugar patterns and problems identified:  She has not brought her monitor for download today  Her lab glucose was 208 after breakfast and she apparently does not check her sugars after breakfast at all  She also thinks that her blood sugars were higher about a month ago when she ran out of her glipizide even though it is unclear whether this is effective in her case or not  She still appears to be needing mealtime coverage at suppertime since we will have readings around 180 or so with eating more carbohydrate or larger meals  However she is not adjusting her HUMALOG at suppertime based on what she is eating and sticks to 5 units of all the time  [place] was replaced by [place] because of insurance coverage  She is now taking 24 units instead of 26 with reportedly good readings in the morning before breakfast  Again has difficulty losing weight  despite continuing Ozempic        Side effects from medications have been: Diarrhea from metformin  Compliance with the medical regimen: Fair  Glucose monitoring:  done 1 +  times a day         Glucometer: One Touch Verio       Blood Glucose readings by time of day by recall:  Mean values apply above for all meters except median for One Touch  PRE-MEAL Fasting Lunch Dinner Bedtime Overall  Glucose range: 108-134      Mean/median:        POST-MEAL PC Breakfast PC Lunch PC Dinner  Glucose range:  160 145-185  Mean/median:       Previous readings:  Mean values apply above for all meters except median for One Touch  PRE-MEAL Fasting Lunch Dinner Bedtime Overall  Glucose range: 72-1 40   138, 149  135, 179    Mean/median: 92     121     Self-care:  Meal times are:  Breakfast is at 9-10 AM Typical meal intake: Breakfast is frequently cheese toast.  Lunch is variable or nothing.  Meals are usually chicken, salad, fish, sometimes vegetables only.  Snacks: Peanuts, yogurt                Dietician visit, most recent:1/19  Exercise:  None, not able to do much Physical activity  Weight history: Previous range 249-305  Wt Readings from Last 3 Encounters:  04/01/18 274 lb (124.3 kg)  02/20/18 274 lb 3.2 oz (124.4 kg)  01/10/18 276 lb (125.2 kg)    Glycemic control:   Lab Results  Component Value Date   HGBA1C 7.1 (H) 03/27/2018   HGBA1C 6.8 (H) 12/25/2017   HGBA1C 7.5 (H) 08/08/2017   Lab Results  Component Value Date   MICROALBUR 2.1 (H) 06/20/2017   LDLCALC 74 02/20/2018   CREATININE 1.25 (H) 03/27/2018   Lab Results  Component Value Date   MICRALBCREAT 2.5 06/20/2017   Lab Results  Component Value Date   FRUCTOSAMINE 301 (H) 09/19/2017   FRUCTOSAMINE 312 (H) 06/20/2017   FRUCTOSAMINE 339 (H) 03/27/2017       Allergies as of 04/01/2018      Reactions   Bupropion Hcl    Palmar rash   Penicillins    rash   Citalopram Hydrobromide Other  (See Comments)   Unknown   Metformin And Related Diarrhea      Medication List        Accurate as of 04/01/18  9:27 AM. Always use your most recent med list.          amLODipine 5 MG tablet Commonly known as:  NORVASC Take 1 tablet (5 mg total) by mouth daily.   aspirin 81 MG tablet Take 81 mg by mouth. 3 by mouth in the evening   atorvastatin 20 MG tablet Commonly known as:  LIPITOR TAKE ONE-HALF (1/2) TABLET ON MONDAY, WEDNESDAY, FRIDAY AND SUNDAY   BILBERRY PO Take 470 mg by mouth. 2 by mouth daily   CLARITIN 10 MG Caps Generic drug:  Loratadine Take 1 capsule by mouth daily as needed.   FLECTOR 1.3 % Ptch Generic drug:  diclofenac APPLY 1 PATCH ONTO THE SKIN TWICE A DAY   fluocinonide cream 0.05 % Commonly known as:  LIDEX Apply 1 application topically 2 (two) times daily.   furosemide 20 MG tablet Commonly known as:  LASIX TAKE 1 TABLET DAILY   glipiZIDE 10 MG 24 hr tablet Commonly known as:  GLUCOTROL XL Take 1 tablet (10 mg total) by mouth 2 (two) times daily.   GLUCOSAMINE PO Take by mouth daily.   glucose blood test strip Commonly known as:  ONETOUCH VERIO Use to test blood sugar twice a day.  DX: E11.21   insulin lispro 100 UNIT/ML KiwkPen Commonly known as:  HUMALOG KWIKPEN Inject 5 units at meal time   Insulin Pen Needle 29G X 5MM Misc Use to inject insulin into skin 1-3 times daily   Insulin Pen Needle 32G X 4 MM Misc Commonly known as:  BD PEN NEEDLE NANO U/F USE TO INJECT INSULIN INTO THE SKIN 1-3 TIMES DAILY   Insulin Pen Needle 32G X 4 MM Misc Commonly known as:  BD PEN NEEDLE NANO U/F USE TO INJECT INSULIN INTO THE SKIN 1-3 TIMES DAILY   metoprolol tartrate 100 MG tablet Commonly known as:  LOPRESSOR Take 1 tablet (100 mg total) by mouth 2 (two) times daily.   multivitamin-lutein Caps capsule Take 1 capsule by mouth daily.   HAIR/SKIN/NAILS Tabs TAKE 1 TABLET DAILY   nystatin ointment Commonly known as:  MYCOSTATIN Apply  1 application topically 2 (two) times daily.   ONETOUCH DELICA LANCETS 21H Misc Use to test blood sugar once daily- E11.21   OZEMPIC 0.25 or 0.5 MG/DOSE Sopn  Generic drug:  Semaglutide INJECT 0.5 MG INTO THE SKIN ONCE WEEKLY   sertraline 100 MG tablet Commonly known as:  ZOLOFT TAKE 1 TABLET DAILY   TRESIBA FLEXTOUCH 100 UNIT/ML Sopn FlexTouch Pen Generic drug:  insulin degludec INJECT 26 UNITS UNDER THE SKIN DAILY   TYLENOL ARTHRITIS PAIN 650 MG CR tablet Generic drug:  acetaminophen 2 by mouth once daily as needed       Allergies:  Allergies  Allergen Reactions  . Bupropion Hcl     Palmar rash  . Penicillins     rash  . Citalopram Hydrobromide Other (See Comments)    Unknown  . Metformin And Related Diarrhea    Past Medical History:  Diagnosis Date  . Allergy   . Anxiety   . Arthritis   . Arthritis   . Callus   . Depression   . Diabetes mellitus without complication ([place])   . Hyperlipidemia   . Hypertension     Past Surgical History:  Procedure Laterality Date  . ABDOMINAL HYSTERECTOMY    . CHOLECYSTECTOMY    . SPLENECTOMY      Family History  Problem Relation Age of Onset  . Cancer Mother   . Cancer Father   . Cancer Sister   . Cancer Brother     Social History:  reports that she has never smoked. She has never used smokeless tobacco. She reports that she does not drink alcohol or use drugs.   Review of Systems   Lipid history: LDL controlled with 20 mg Lipitor She is concerned about her high triglycerides but these were nonfasting    Lab Results  Component Value Date   CHOL 164 02/20/2018   HDL 59.30 02/20/2018   LDLCALC 74 02/20/2018   LDLDIRECT 78.3 04/16/2013   TRIG 151.0 (H) 02/20/2018   CHOLHDL 3 02/20/2018           Hypertension: Currently on  Metoprolol and amlodipine, was recommended lower dose of metoprolol because of hyperkalemia   CKD: Mild, creatinine improved with stopping losartan previously  This is followed  by nephrologist   HYPERKALEMIA: Potassium is higher again She think this is from eating oranges a few days ago Has been given low potassium diet by nephrologist     Lab Results  Component Value Date   CREATININE 1.25 (H) 03/27/2018   CREATININE 1.35 (H) 02/20/2018   CREATININE 1.27 (H) 12/25/2017   CREATININE 1.27 (H) 09/19/2017   Lab Results  Component Value Date   K 5.3 (H) 03/27/2018    Most recent foot exam: 03/2018  She does have peripheral neuropathy with numbness   LABS:  Lab on 03/27/2018  Component Date Value Ref Range Status  . Sodium 03/27/2018 137  135 - 145 mEq/L Final  . Potassium 03/27/2018 5.3* 3.5 - 5.1 mEq/L Final  . Chloride 03/27/2018 103  96 - 112 mEq/L Final  . CO2 03/27/2018 28  19 - 32 mEq/L Final  . Glucose, Bld 03/27/2018 208* 70 - 99 mg/dL Final  . BUN 03/27/2018 29* 6 - 23 mg/dL Final  . Creatinine, Ser 03/27/2018 1.25* 0.40 - 1.20 mg/dL Final  . Total Bilirubin 03/27/2018 0.6  0.2 - 1.2 mg/dL Final  . Alkaline Phosphatase 03/27/2018 66  39 - 117 U/L Final  . AST 03/27/2018 22  0 - 37 U/L Final  . ALT 03/27/2018 24  0 - 35 U/L Final  . Total Protein 03/27/2018 6.9  6.0 - 8.3 g/dL Final  . Albumin 03/27/2018  3.4* 3.5 - 5.2 g/dL Final  . Calcium 03/27/2018 9.4  8.4 - 10.5 mg/dL Final  . GFR 03/27/2018 43.68* >60.00 mL/min Final  . Hgb A1c MFr Bld 03/27/2018 7.1* 4.6 - 6.5 % Final   Glycemic Control Guidelines for People with Diabetes:Non Diabetic:  <6%Goal of Therapy: <7%Additional Action Suggested:  >8%     Physical Examination:  BP 128/78 (BP Location: Right Arm, Patient Position: Sitting, Cuff Size: Large)   Pulse 70   Ht 5\' 9"  (1.753 m)   Wt 274 lb (124.3 kg)   SpO2 97%   BMI 40.46 kg/m      Diabetic Foot Exam - Simple   Simple Foot Form Diabetic Foot exam was performed with the following findings:  Yes 04/01/2018  9:30 AM  Visual Inspection No deformities, no ulcerations, no other skin breakdown bilaterally:  Yes Sensation  Testing See comments:  Yes Pulse Check See comments:  Yes Comments Pedal pulses absent, difficult to palpate posterior tibialis Monofilament sensation decreased in the distal toes and absent plantar surfaces distally on the right and markedly decreased on the left     No ankle edema  ASSESSMENT:  Diabetes type 2, uncontrolled With morbid obesity, insulin requiring  See history of present illness for detailed discussion of current diabetes management, blood sugar patterns and problems identified   Her A1c is still fairly good although slightly higher at 7.1 She thinks her sugars are higher over 3 months because of not taking glipizide although this is doubtful  She does appear to have higher postprandial readings especially after breakfast as discussed above Also not getting adequate coverage for her meals sometimes in the evening She is taking appropriate dose of Tresiba but fasting readings are reportedly fairly good  HYPERKALEMIA: This is recurring again as discussed above She thinks she has all the information needed to follow her diet and will try to do more consistent with this   HYPERTENSION: Well-controlled, followed by other physicians  HYPERLIPIDEMIA: Well controlled, her slightly higher triglycerides are because of being nonfasting and reassured her that her control is adequate  PLAN:    She does need to start taking HUMALOG at breakfast, she is usually having the same amount of carbohydrate and sometimes no protein also discussed need to cover her morning meal also with 5 units of Humalog to start with  She will adjust this further her postprandial readings are not in target range that were discussed, she can check readings after breakfast at least 2 or 3 times a week  She can cut back on testing in the morning for breakfast  Again reminded her to check more readings after supper to help adjust the Humalog  Since she thinks that her sugars are higher when she is  eating more carbohydrate or some form of meat suppertime she will take 7 units for those meals  No change in [place]  Although not clear if glipizide is working for her she thinks it is helping her sugar, she can continue this but she can reduce the dose to 1 tablet daily of the 10 mg ER  Encouraged her to do some chair exercises  Recommend follow-up with dietitian but she wants to do it on her own  Avoid high potassium foods such as oranges and follow-up with nephrologist for her potassium  To bring her glucose monitor on the next visit for review which she did not do today     Counseling time on multiple subjects discussed in assessment and  plan sections is over 50% of today's 25 minute visit    Patient Instructions  HUMALOG 5 IN AM ALSO Take 7 Humalog at dinner for bigger meals   More testing  At 8 am  1 Glipizide only    Elayne Snare 04/01/2018, 9:27 AM   Note: This office note was prepared with Dragon voice recognition system technology. Any transcriptional errors that result from this process are unintentional.

## 2018-04-01 NOTE — Patient Instructions (Addendum)
HUMALOG 5 IN AM ALSO Take 7 Humalog at dinner for bigger meals   More testing  At 8 am  1 Glipizide only

## 2018-04-23 ENCOUNTER — Other Ambulatory Visit: Payer: Self-pay | Admitting: Family Medicine

## 2018-04-23 DIAGNOSIS — I10 Essential (primary) hypertension: Secondary | ICD-10-CM

## 2018-06-04 ENCOUNTER — Other Ambulatory Visit: Payer: Self-pay | Admitting: Family Medicine

## 2018-06-04 DIAGNOSIS — R600 Localized edema: Secondary | ICD-10-CM

## 2018-06-05 ENCOUNTER — Other Ambulatory Visit: Payer: Self-pay | Admitting: Endocrinology

## 2018-06-05 ENCOUNTER — Telehealth: Payer: Self-pay | Admitting: Endocrinology

## 2018-06-05 DIAGNOSIS — E1165 Type 2 diabetes mellitus with hyperglycemia: Secondary | ICD-10-CM

## 2018-06-05 NOTE — Telephone Encounter (Signed)
Patient stated the pharmacy rejected her request for test strips for the one touch verio flex. [facility] [place], [place] DEA #:

## 2018-06-09 ENCOUNTER — Telehealth: Payer: Self-pay | Admitting: Endocrinology

## 2018-06-09 ENCOUNTER — Other Ambulatory Visit: Payer: Self-pay

## 2018-06-09 DIAGNOSIS — E1165 Type 2 diabetes mellitus with hyperglycemia: Secondary | ICD-10-CM

## 2018-06-09 MED ORDER — GLUCOSE BLOOD VI STRP: ORAL_STRIP | 3 refills | Status: DC

## 2018-06-09 NOTE — Telephone Encounter (Signed)
Patient is calling back in regards to her  one touch verio flex test strips. She states that the pharmacy has still not received these.     [place], [place]

## 2018-06-09 NOTE — Telephone Encounter (Signed)
Medication sent to [facility]

## 2018-06-26 ENCOUNTER — Other Ambulatory Visit (INDEPENDENT_AMBULATORY_CARE_PROVIDER_SITE_OTHER): Payer: Medicare Other

## 2018-06-26 DIAGNOSIS — E1165 Type 2 diabetes mellitus with hyperglycemia: Secondary | ICD-10-CM

## 2018-06-26 DIAGNOSIS — Z794 Long term (current) use of insulin: Secondary | ICD-10-CM

## 2018-06-26 LAB — COMPREHENSIVE METABOLIC PANEL
ALK PHOS: 59 U/L (ref 39–117)
ALT: 19 U/L (ref 0–35)
AST: 18 U/L (ref 0–37)
Albumin: 3.7 g/dL (ref 3.5–5.2)
BILIRUBIN TOTAL: 0.7 mg/dL (ref 0.2–1.2)
BUN: 33 mg/dL — AB (ref 6–23)
CO2: 25 mEq/L (ref 19–32)
CREATININE: 1.43 mg/dL — AB (ref 0.40–1.20)
Calcium: 9.2 mg/dL (ref 8.4–10.5)
Chloride: 102 mEq/L (ref 96–112)
GFR: 37.38 mL/min — ABNORMAL LOW (ref >60.00)
Glucose, Bld: 188 mg/dL — ABNORMAL HIGH (ref 70–99)
Potassium: 4.8 mEq/L (ref 3.5–5.1)
SODIUM: 134 meq/L — AB (ref 135–145)
TOTAL PROTEIN: 6.9 g/dL (ref 6.0–8.3)

## 2018-06-26 LAB — MICROALBUMIN / CREATININE URINE RATIO
Creatinine,U: 119.6 mg/dL
Microalb Creat Ratio: 3.1 mg/g (ref 0.0–30.0)
Microalb, Ur: 3.7 mg/dL — ABNORMAL HIGH (ref 0.0–1.9)

## 2018-06-26 LAB — HEMOGLOBIN A1C: Hgb A1c MFr Bld: 7.1 % — ABNORMAL HIGH (ref 4.6–6.5)

## 2018-07-01 ENCOUNTER — Encounter: Payer: Self-pay | Admitting: Endocrinology

## 2018-07-01 ENCOUNTER — Other Ambulatory Visit: Payer: Self-pay

## 2018-07-01 ENCOUNTER — Ambulatory Visit: Payer: Medicare Other | Admitting: Endocrinology

## 2018-07-01 VITALS — BP 128/68 | HR 81 | Ht 69.0 in | Wt 275.0 lb

## 2018-07-01 DIAGNOSIS — E1165 Type 2 diabetes mellitus with hyperglycemia: Secondary | ICD-10-CM

## 2018-07-01 DIAGNOSIS — Z794 Long term (current) use of insulin: Secondary | ICD-10-CM

## 2018-07-01 MED ORDER — SEMAGLUTIDE(0.25 OR 0.5MG/DOS) 2 MG/1.5ML ~~LOC~~ SOPN: 0.5000 mg | PEN_INJECTOR | SUBCUTANEOUS | 3 refills | Status: DC

## 2018-07-01 NOTE — Patient Instructions (Addendum)
Tresiba 26 units at dinner

## 2018-07-01 NOTE — Progress Notes (Signed)
Patient ID: Katelyn Mahoney, female   DOB: 1934/10/01, 84 y.o.   MRN: [id number]            Reason for Appointment: Follow-up for Type 2 Diabetes  Referring physician: Etter Sjogren   History of Present Illness:          Date of diagnosis of type 2 diabetes mellitus: ?  1983        Background history:   She had previously been on metformin with fairly good control for some time She was also at some point treated with Janumet Apparently subsequently she could not tolerate metformin and had diarrhea This was switched to glipizide about 2 years ago and she is also being treated with Januvia and Chana Bode was started in 9/17  Review of A1c indicates that her levels had been significantly high since 2016 She was initially seen in consultation on 02/27/17  Recent history:       Non-insulin hypoglycemic drugs the patient is taking are: Ozempic 0.5 mg weekly, glipizide ER 10 mg 2x a day  INSULIN regimen: Tresiba 24 units at supper, Humalog 5 units at supper and breakfast  A1c is now 7.1, same as before compared to as high as 8.1 previously  Current management, blood sugar patterns and problems identified:  She has brought her monitor for download today  Her blood sugars are consistently high now fasting and compared to her last visit  Even though she was told to take Humalog to cover her breakfast she is doing this only irregularly since she forgets and may get readings from around 190-200 at lunch occasionally  However she Tries to do better with suppertime Humalog, may rarely forget this along with her evening Tresiba  No hypoglycemia and lowest blood sugar is 123  Usually trying to watch her portions and eating smaller meals or only a snack at lunch  Again has difficulty losing weight despite continuing Ozempic        Side effects from medications have been: Diarrhea from metformin  Compliance with the medical regimen: Fair  Glucose monitoring:  done 1 +  times a day          Glucometer: One Touch Verio       Blood Glucose readings by time of day by analysis of her blood sugar monitor download:   Mean values apply above for all meters except median for One Touch  PRE-MEAL Fasting Lunch Dinner Bedtime Overall  Glucose range:  123-177     123-206   Mean/median:  136  168   145   POST-MEAL PC Breakfast PC Lunch PC Dinner  Glucose range:    148, 166  Mean/median:         Self-care:  Meal times are:  Breakfast is at 9-10 AM Typical meal intake: Breakfast is frequently cheese toast.  Lunch is variable or nothing.  Meals are usually chicken, salad, fish, sometimes vegetables only.  Snacks: Peanuts, yogurt                Dietician visit, most recent:1/19                Exercise:  None, not able to do much Physical activity  Weight history: Previous range 249-305  Wt Readings from Last 3 Encounters:  07/01/18 275 lb (124.7 kg)  04/01/18 274 lb (124.3 kg)  02/20/18 274 lb 3.2 oz (124.4 kg)    Glycemic control:   Lab Results  Component Value Date   HGBA1C 7.1 (H)  06/26/2018   HGBA1C 7.1 (H) 03/27/2018   HGBA1C 6.8 (H) 12/25/2017   Lab Results  Component Value Date   MICROALBUR 3.7 (H) 06/26/2018   LDLCALC 74 02/20/2018   CREATININE 1.43 (H) 06/26/2018   Lab Results  Component Value Date   MICRALBCREAT 3.1 06/26/2018   Lab Results  Component Value Date   FRUCTOSAMINE 301 (H) 09/19/2017   FRUCTOSAMINE 312 (H) 06/20/2017   FRUCTOSAMINE 339 (H) 03/27/2017       Allergies as of 07/01/2018      Reactions   Bupropion Hcl    Palmar rash   Penicillins    rash   Citalopram Hydrobromide Other (See Comments)   Unknown   Metformin And Related Diarrhea      Medication List        Accurate as of 07/01/18 12:08 PM. Always use your most recent med list.          amLODipine 5 MG tablet Commonly known as:  NORVASC Take 1 tablet (5 mg total) by mouth daily.   aspirin 81 MG tablet Take 81 mg by mouth. 3 by mouth in the evening     atorvastatin 20 MG tablet Commonly known as:  LIPITOR TAKE ONE-HALF (1/2) TABLET ON MONDAY, WEDNESDAY, FRIDAY AND SUNDAY   BILBERRY PO Take 470 mg by mouth. 2 by mouth daily   CLARITIN 10 MG Caps Generic drug:  Loratadine Take 1 capsule by mouth daily as needed.   FLECTOR 1.3 % Ptch Generic drug:  diclofenac APPLY 1 PATCH ONTO THE SKIN TWICE A DAY   fluocinonide cream 0.05 % Commonly known as:  LIDEX Apply 1 application topically 2 (two) times daily.   furosemide 20 MG tablet Commonly known as:  LASIX TAKE 1 TABLET DAILY   glipiZIDE 10 MG 24 hr tablet Commonly known as:  GLUCOTROL XL Take 1 tablet (10 mg total) by mouth 2 (two) times daily.   GLUCOSAMINE PO Take by mouth daily.   glucose blood test strip Commonly known as:  ONETOUCH VERIO Use to test blood sugar twice a day.  DX: E11.21   insulin lispro 100 UNIT/ML KiwkPen Commonly known as:  HUMALOG KWIKPEN Inject 5 units at meal time   Insulin Pen Needle 29G X 5MM Misc Use to inject insulin into skin 1-3 times daily   Insulin Pen Needle 32G X 4 MM Misc Commonly known as:  BD PEN NEEDLE NANO U/F USE TO INJECT INSULIN INTO THE SKIN 1-3 TIMES DAILY   Insulin Pen Needle 32G X 4 MM Misc Commonly known as:  BD PEN NEEDLE NANO U/F USE TO INJECT INSULIN INTO THE SKIN 1-3 TIMES DAILY   metoprolol tartrate 100 MG tablet Commonly known as:  LOPRESSOR Take 1 tablet (100 mg total) by mouth 2 (two) times daily.   multivitamin-lutein Caps capsule Take 1 capsule by mouth daily.   HAIR/SKIN/NAILS Tabs TAKE 1 TABLET DAILY   nystatin ointment Commonly known as:  MYCOSTATIN Apply 1 application topically 2 (two) times daily.   ONETOUCH DELICA LANCETS 12X Misc Use to test blood sugar once daily- E11.21   Semaglutide 0.25 or 0.5 MG/DOSE Sopn Commonly known as:  OZEMPIC Inject 0.5 mg into the skin once a week.   sertraline 100 MG tablet Commonly known as:  ZOLOFT TAKE 1 TABLET DAILY   TRESIBA FLEXTOUCH 100  UNIT/ML Sopn FlexTouch Pen Generic drug:  insulin degludec INJECT 26 UNITS UNDER THE SKIN DAILY   TYLENOL ARTHRITIS PAIN 650 MG CR tablet Generic drug:  acetaminophen 2 by mouth once daily as needed       Allergies:  Allergies  Allergen Reactions  . Bupropion Hcl     Palmar rash  . Penicillins     rash  . Citalopram Hydrobromide Other (See Comments)    Unknown  . Metformin And Related Diarrhea    Past Medical History:  Diagnosis Date  . Allergy   . Anxiety   . Arthritis   . Arthritis   . Callus   . Depression   . Diabetes mellitus without complication ([place])   . Hyperlipidemia   . Hypertension     Past Surgical History:  Procedure Laterality Date  . ABDOMINAL HYSTERECTOMY    . CHOLECYSTECTOMY    . SPLENECTOMY      Family History  Problem Relation Age of Onset  . Cancer Mother   . Cancer Father   . Cancer Sister   . Cancer Brother     Social History:  reports that she has never smoked. She has never used smokeless tobacco. She reports that she does not drink alcohol or use drugs.   Review of Systems   Lipid history: LDL controlled with 20 mg Lipitor She is concerned about her high triglycerides but these were nonfasting    Lab Results  Component Value Date   CHOL 164 02/20/2018   HDL 59.30 02/20/2018   LDLCALC 74 02/20/2018   LDLDIRECT 78.3 04/16/2013   TRIG 151.0 (H) 02/20/2018   CHOLHDL 3 02/20/2018           Hypertension: Currently on  Metoprolol and amlodipine, was recommended lower dose of metoprolol because of hyperkalemia  Lasix is used only as needed  CKD: Mild, creatinine improved with stopping losartan previously but appears slightly higher again  This is followed by nephrologist   HYPERKALEMIA: Potassium is back to normal now  Has been given low potassium diet by nephrologist     Lab Results  Component Value Date   CREATININE 1.43 (H) 06/26/2018   CREATININE 1.25 (H) 03/27/2018   CREATININE 1.35 (H) 02/20/2018    CREATININE 1.27 (H) 12/25/2017   Lab Results  Component Value Date   K 4.8 06/26/2018    Most recent foot exam: 03/2018  She does have peripheral neuropathy with numbness   LABS:  Lab on 06/26/2018  Component Date Value Ref Range Status  . Microalb, Ur 06/26/2018 3.7* 0.0 - 1.9 mg/dL Final  . Creatinine,U 06/26/2018 119.6  mg/dL Final  . Microalb Creat Ratio 06/26/2018 3.1  0.0 - 30.0 mg/g Final  . Sodium 06/26/2018 134* 135 - 145 mEq/L Final  . Potassium 06/26/2018 4.8  3.5 - 5.1 mEq/L Final  . Chloride 06/26/2018 102  96 - 112 mEq/L Final  . CO2 06/26/2018 25  19 - 32 mEq/L Final  . Glucose, Bld 06/26/2018 188* 70 - 99 mg/dL Final  . BUN 06/26/2018 33* 6 - 23 mg/dL Final  . Creatinine, Ser 06/26/2018 1.43* 0.40 - 1.20 mg/dL Final  . Total Bilirubin 06/26/2018 0.7  0.2 - 1.2 mg/dL Final  . Alkaline Phosphatase 06/26/2018 59  39 - 117 U/L Final  . AST 06/26/2018 18  0 - 37 U/L Final  . ALT 06/26/2018 19  0 - 35 U/L Final  . Total Protein 06/26/2018 6.9  6.0 - 8.3 g/dL Final  . Albumin 06/26/2018 3.7  3.5 - 5.2 g/dL Final  . Calcium 06/26/2018 9.2  8.4 - 10.5 mg/dL Final  . GFR 06/26/2018 37.38* >60.00 mL/min Final  .  Hgb A1c MFr Bld 06/26/2018 7.1* 4.6 - 6.5 % Final   Glycemic Control Guidelines for People with Diabetes:Non Diabetic:  <6%Goal of Therapy: <7%Additional Action Suggested:  >8%     Physical Examination:  BP 128/68 (BP Location: Left Arm, Patient Position: Sitting, Cuff Size: Normal)   Pulse 81   Ht 5\' 9"  (1.753 m)   Wt 275 lb (124.7 kg)   SpO2 98%   BMI 40.61 kg/m      No ankle edema  Diabetic Foot Exam - Simple   Simple Foot Form Diabetic Foot exam was performed with the following findings:  Yes   Visual Inspection No deformities, no ulcerations, no other skin breakdown bilaterally:  Yes Sensation Testing Intact to touch and monofilament testing bilaterally:  Yes Pulse Check Posterior Tibialis and Dorsalis pulse intact bilaterally:   Yes Comments      ASSESSMENT:  Diabetes type 2, uncontrolled with morbid obesity, insulin requiring  See history of present illness for detailed discussion of current diabetes management, blood sugar patterns and problems identified   Her A1c is still fairly good at 7.1  Although her A1c is fairly good and not changed her recent blood sugars are looking higher after breakfast from not taking her Humalog consistently Also fasting readings are fairly consistently high, occasionally this may be from forgetting her [place] and eating but appears to be needing more insulin now This is despite continuing on Ozempic regularly Overall weight is stable and she thinks she is usually doing well on her diet  HYPERTENSION: Well-controlled, follow-up with nephrologist and PCP   PLAN:    She would try to be more consistent with her insulin doses including at breakfast for her Humalog, she will try to keep note of how she is taking her insulin  Also needs to check more sugars after supper to help assess her Humalog dose  Fasting readings are probably will be better with increasing her Tyler Aas to at least 26 units and may need 28 if they are consistently over 130 which is her target  To call if blood sugars are consistently high  No change in Ozempic or glipizide     Counseling time on multiple subjects discussed in assessment and plan sections is over 50% of today's 25 minute visit    Patient Instructions  Tresiba 26 units at dinner    Elayne Snare 07/01/2018, 12:08 PM   Note: This office note was prepared with Dragon voice recognition system technology. Any transcriptional errors that result from this process are unintentional.

## 2018-07-15 ENCOUNTER — Other Ambulatory Visit: Payer: Self-pay | Admitting: Family Medicine

## 2018-07-18 LAB — HM MAMMOGRAPHY

## 2018-07-23 ENCOUNTER — Encounter: Payer: Self-pay | Admitting: *Deleted

## 2018-07-23 ENCOUNTER — Other Ambulatory Visit: Payer: Self-pay | Admitting: Family Medicine

## 2018-07-23 DIAGNOSIS — I10 Essential (primary) hypertension: Secondary | ICD-10-CM

## 2018-08-05 ENCOUNTER — Other Ambulatory Visit: Payer: Self-pay | Admitting: Endocrinology

## 2018-08-11 ENCOUNTER — Other Ambulatory Visit: Payer: Self-pay | Admitting: Endocrinology

## 2018-08-11 DIAGNOSIS — E1165 Type 2 diabetes mellitus with hyperglycemia: Principal | ICD-10-CM

## 2018-08-11 DIAGNOSIS — IMO0002 Reserved for concepts with insufficient information to code with codable children: Secondary | ICD-10-CM

## 2018-08-11 DIAGNOSIS — E1151 Type 2 diabetes mellitus with diabetic peripheral angiopathy without gangrene: Secondary | ICD-10-CM

## 2018-08-11 DIAGNOSIS — E1121 Type 2 diabetes mellitus with diabetic nephropathy: Secondary | ICD-10-CM

## 2018-08-21 ENCOUNTER — Other Ambulatory Visit: Payer: Self-pay | Admitting: Endocrinology

## 2018-08-21 DIAGNOSIS — E1121 Type 2 diabetes mellitus with diabetic nephropathy: Secondary | ICD-10-CM

## 2018-08-22 ENCOUNTER — Encounter: Payer: Self-pay | Admitting: Family Medicine

## 2018-08-22 ENCOUNTER — Ambulatory Visit (INDEPENDENT_AMBULATORY_CARE_PROVIDER_SITE_OTHER): Payer: Medicare Other | Admitting: Family Medicine

## 2018-08-22 VITALS — BP 133/52 | HR 78 | Temp 98.1°F | Resp 16 | Ht 69.0 in | Wt 276.8 lb

## 2018-08-22 DIAGNOSIS — E785 Hyperlipidemia, unspecified: Secondary | ICD-10-CM

## 2018-08-22 DIAGNOSIS — E2839 Other primary ovarian failure: Secondary | ICD-10-CM

## 2018-08-22 DIAGNOSIS — I10 Essential (primary) hypertension: Secondary | ICD-10-CM | POA: Diagnosis not present

## 2018-08-22 LAB — COMPREHENSIVE METABOLIC PANEL
ALBUMIN: 3.9 g/dL (ref 3.5–5.2)
ALT: 21 U/L (ref 0–35)
AST: 19 U/L (ref 0–37)
Alkaline Phosphatase: 71 U/L (ref 39–117)
BUN: 37 mg/dL — ABNORMAL HIGH (ref 6–23)
CALCIUM: 10.2 mg/dL (ref 8.4–10.5)
CHLORIDE: 102 meq/L (ref 96–112)
CO2: 25 meq/L (ref 19–32)
CREATININE: 1.42 mg/dL — AB (ref 0.40–1.20)
GFR: 37.67 mL/min — ABNORMAL LOW (ref >60.00)
Glucose, Bld: 148 mg/dL — ABNORMAL HIGH (ref 70–99)
Potassium: 5 mEq/L (ref 3.5–5.1)
Sodium: 135 mEq/L (ref 135–145)
Total Bilirubin: 0.8 mg/dL (ref 0.2–1.2)
Total Protein: 7 g/dL (ref 6.0–8.3)

## 2018-08-22 LAB — LIPID PANEL
CHOL/HDL RATIO: 3
CHOLESTEROL: 161 mg/dL (ref 0–200)
HDL: 58.4 mg/dL (ref >39.00)
LDL CALC: 80 mg/dL (ref 0–99)
NonHDL: 102.96
Triglycerides: 116 mg/dL (ref 0.0–149.0)
VLDL: 23.2 mg/dL (ref 0.0–40.0)

## 2018-08-22 LAB — CBC WITH DIFFERENTIAL/PLATELET
BASOS PCT: 1 % (ref 0.0–3.0)
Basophils Absolute: 0.1 10*3/uL (ref 0.0–0.1)
EOS ABS: 0.4 10*3/uL (ref 0.0–0.7)
EOS PCT: 3.6 % (ref 0.0–5.0)
HEMATOCRIT: 44.1 % (ref 36.0–46.0)
HEMOGLOBIN: 15.4 g/dL — AB (ref 12.0–15.0)
LYMPHS PCT: 26.1 % (ref 12.0–46.0)
Lymphs Abs: 3 10*3/uL (ref 0.7–4.0)
MCHC: 35 g/dL (ref 30.0–36.0)
MCV: 99.3 fl (ref 78.0–100.0)
Monocytes Absolute: 1.2 10*3/uL — ABNORMAL HIGH (ref 0.1–1.0)
Monocytes Relative: 10.1 % (ref 3.0–12.0)
NEUTROS ABS: 6.9 10*3/uL (ref 1.4–7.7)
Neutrophils Relative %: 59.2 % (ref 43.0–77.0)
PLATELETS: 371 10*3/uL (ref 150.0–400.0)
RBC: 4.44 Mil/uL (ref 3.87–5.11)
RDW: 16.1 % — AB (ref 11.5–15.5)
WBC: 11.6 10*3/uL — AB (ref 4.0–10.5)

## 2018-08-22 NOTE — Progress Notes (Signed)
Subjective:     Katelyn Mahoney is a 84 y.o. female and is here for a comprehensive physical exam. The patient reports problems - arthritis R hand --- tylenol arthritis helped,  when it gets cold it is worse .  HYPERTENSION   Blood pressure range-not checking   Chest pain- no      Dyspnea- no Lightheadedness- no   Edema- some in the ri ankle Other side effects - no   Medication compliance: good Low salt diet- yes    DIABETES    Blood Sugar ranges-111-135  Polyuria- no New Visual problems- no  Hypoglycemic symptoms- no  Other side effects-no Medication compliance - good Last eye exam- 09/06/2018 Foot exam- today   HYPERLIPIDEMIA  Medication compliance- good RUQ pain- no  Muscle aches- no Other side effects-no      Social History   Socioeconomic History  . Marital status: Widowed    Spouse name: Not on file  . Number of children: Not on file  . Years of education: Not on file  . Highest education level: Not on file  Occupational History  . Not on file  Social Needs  . Financial resource strain: Not on file  . Food insecurity:    Worry: Not on file    Inability: Not on file  . Transportation needs:    Medical: Not on file    Non-medical: Not on file  Tobacco Use  . Smoking status: Never Smoker  . Smokeless tobacco: Never Used  Substance and Sexual Activity  . Alcohol use: No    Alcohol/week: 0.0 standard drinks  . Drug use: No  . Sexual activity: Never    Birth control/protection: Abstinence  Lifestyle  . Physical activity:    Days per week: Not on file    Minutes per session: Not on file  . Stress: Not on file  Relationships  . Social connections:    Talks on phone: Not on file    Gets together: Not on file    Attends religious service: Not on file    Active member of club or organization: Not on file    Attends meetings of clubs or organizations: Not on file    Relationship status: Not on file  . Intimate partner violence:    Fear of current or  ex partner: Not on file    Emotionally abused: Not on file    Physically abused: Not on file    Forced sexual activity: Not on file  Other Topics Concern  . Not on file  Social History Narrative  . Not on file   Health Maintenance  Topic Date Due  . OPHTHALMOLOGY EXAM  08/30/2017  . INFLUENZA VACCINE  07/31/2018  . HEMOGLOBIN A1C  12/26/2018  . FOOT EXAM  07/02/2019  . MAMMOGRAM  07/19/2019  . TETANUS/TDAP  04/07/2024  . DEXA SCAN  Completed  . PNA vac Low Risk Adult  Completed    The following portions of the patient's history were reviewed and updated as appropriate:  She  has a past medical history of Allergy, Anxiety, Arthritis, Arthritis, Callus, Depression, Diabetes mellitus without complication ([place]), Hyperlipidemia, and Hypertension. She does not have any pertinent problems on file. She  has a past surgical history that includes Cholecystectomy; Abdominal hysterectomy; and Splenectomy. Her family history includes Brain cancer in her brother; Cancer in her brother, father, mother, and sister; Cancer - Lung in her brother; Diabetes in her brother, brother, father, maternal grandmother, mother, paternal grandmother, and sister. She  reports that she has never smoked. She has never used smokeless tobacco. She reports that she does not drink alcohol or use drugs. She has a current medication list which includes the following prescription(s): acetaminophen, amlodipine, aspirin, atorvastatin, bilberry (vaccinium myrtillus), flector, fluocinonide cream, furosemide, glipizide, glucosamine hcl, glucose blood, insulin lispro, insulin pen needle, insulin pen needle, insulin pen needle, loratadine, metoprolol tartrate, hair/skin/nails, multivitamin-lutein, nystatin ointment, onetouch delica lancets 40J, semaglutide, sertraline, and tresiba flextouch. Current Outpatient Medications on File Prior to Visit  Medication Sig Dispense Refill  . acetaminophen (TYLENOL ARTHRITIS PAIN) 650 MG CR  tablet 2 by mouth once daily as needed    . amLODipine (NORVASC) 5 MG tablet Take 1 tablet (5 mg total) by mouth daily. 90 tablet 2  . aspirin 81 MG tablet Take 81 mg by mouth. 3 by mouth in the evening    . atorvastatin (LIPITOR) 20 MG tablet TAKE ONE-HALF (1/2) TABLET ON MONDAY, WEDNESDAY, FRIDAY AND SUNDAY 26 tablet 3  . Bilberry, Vaccinium myrtillus, (BILBERRY PO) Take 470 mg by mouth. 2 by mouth daily    . FLECTOR 1.3 % PTCH APPLY 1 PATCH ONTO THE SKIN TWICE A DAY (Patient taking differently: APPLY 1 PATCH ONTO THE SKIN TWICE A DAY PRN) 30 patch 2  . fluocinonide cream (LIDEX) 8.11 % Apply 1 application topically 2 (two) times daily.    . furosemide (LASIX) 20 MG tablet TAKE 1 TABLET DAILY 90 tablet 1  . glipiZIDE (GLUCOTROL XL) 10 MG 24 hr tablet Take 1 tablet (10 mg total) by mouth 2 (two) times daily. 60 tablet 3  . Glucosamine HCl (GLUCOSAMINE PO) Take by mouth daily.    Marland Kitchen glucose blood (ONETOUCH VERIO) test strip Use to test blood sugar twice a day.  DX: E11.21 200 each 3  . insulin lispro (HUMALOG KWIKPEN) 100 UNIT/ML KiwkPen INJECT 5 UNITS AT MEAL TIMES (MAX OF 20 UNITS PER DAY) 15 pen 3  . Insulin Pen Needle (BD PEN NEEDLE NANO U/F) 32G X 4 MM MISC USE TO INJECT INSULIN INTO THE SKIN 1-3 TIMES DAILY 100 each 0  . Insulin Pen Needle (BD PEN NEEDLE NANO U/F) 32G X 4 MM MISC USE TO INJECT INSULIN INTO THE SKIN 1-3 TIMES DAILY 100 each 3  . Insulin Pen Needle 29G X 5MM MISC Use to inject insulin into skin 1-3 times daily 100 each 2  . Loratadine (CLARITIN) 10 MG CAPS Take 1 capsule by mouth daily as needed.     . metoprolol tartrate (LOPRESSOR) 100 MG tablet Take 100 mg by mouth 2 (two) times daily. Take 1 tablet in the morning and 1/2 tablet at night    . Multiple Vitamins-Minerals (HAIR/SKIN/NAILS) TABS TAKE 1 TABLET DAILY 90 tablet 2  . multivitamin-lutein (OCUVITE-LUTEIN) CAPS capsule Take 1 capsule by mouth daily. 90 capsule 3  . nystatin ointment (MYCOSTATIN) Apply 1 application  topically 2 (two) times daily. 30 g 6  . ONETOUCH DELICA LANCETS 91Y MISC Use to test blood sugar once daily- E11.21 100 each 3  . Semaglutide (OZEMPIC) 0.25 or 0.5 MG/DOSE SOPN Inject 0.5 mg into the skin once a week. 1.5 mL 3  . sertraline (ZOLOFT) 100 MG tablet TAKE 1 TABLET DAILY 90 tablet 0  . TRESIBA FLEXTOUCH 100 UNIT/ML SOPN FlexTouch Pen INJECT 26 UNITS UNDER THE SKIN DAILY (Patient taking differently: INJECT 24 UNITS UNDER THE SKIN DAILY) 30 mL 1   No current facility-administered medications on file prior to visit.    She is allergic  to bupropion hcl; penicillins; citalopram hydrobromide; and metformin and related..  Review Review of Systems  Constitutional: Negative for activity change, appetite change and fatigue.  HENT: Negative for hearing loss, congestion, tinnitus and ear discharge.  dentist q28m Eyes: Negative for visual disturbance (see optho q1y -- vision corrected to 20/20 with glasses).  Respiratory: Negative for cough, chest tightness and shortness of breath.   Cardiovascular: Negative for chest pain, palpitations and leg swelling.  Gastrointestinal: Negative for abdominal pain, diarrhea, constipation and abdominal distention.  Genitourinary: Negative for urgency, frequency, decreased urine volume and difficulty urinating.  Musculoskeletal: +arthralgias  Skin: Negative for color change, pallor and rash.  Neurological: Negative for dizziness, light-headedness, numbness and headaches.  Hematological: Negative for adenopathy. Does not bruise/bleed easily.  Psychiatric/Behavioral: Negative for suicidal ideas, confusion, sleep disturbance, self-injury, dysphoric mood, decreased concentration and agitation.       Objective:    BP (!) 133/52   Pulse 78   Temp 98.1 F (36.7 C) (Oral)   Resp 16   Ht 5\' 9"  (1.753 m)   Wt 276 lb 12.8 oz (125.6 kg)   SpO2 96%   BMI 40.88 kg/m  General appearance: alert, cooperative, appears stated age and no distress Head:  Normocephalic, without obvious abnormality, atraumatic Eyes: negative findings: lids and lashes normal and pupils equal, round, reactive to light and accomodation Ears: normal TM's and external ear canals both ears Nose: Nares normal. Septum midline. Mucosa normal. No drainage or sinus tenderness. Throat: lips, mucosa, and tongue normal; teeth and gums normal Neck: no adenopathy, no carotid bruit, no JVD, supple, symmetrical, trachea midline and thyroid not enlarged, symmetric, no tenderness/mass/nodules Back: symmetric, no curvature. ROM normal. No CVA tenderness. Lungs: clear to auscultation bilaterally Breasts: pt preferred not to do Heart: regular rate and rhythm, S1, S2 normal, no murmur, click, rub or gallop Abdomen: soft, non-tender; bowel sounds normal; no masses,  no organomegaly Pelvic: not indicated; post-menopausal, no abnormal Pap smears in past Extremities: extremities normal, atraumatic, no cyanosis or edema Pulses: 2+ and symmetric Skin: Skin color, texture, turgor normal. No rashes or lesions Lymph nodes: Cervical, supraclavicular, and axillary nodes normal. Neurologic: Alert and oriented X 3, normal strength and tone. Normal symmetric reflexes. Normal coordination and gait    Assessment:    Healthy female exam.      Plan:     ghm utd Check labs See After Visit Summary for Counseling Recommendations    1. Essential hypertension Well controlled, no changes to meds. Encouraged heart healthy diet such as the DASH diet and exercise as tolerated.  - CBC with Differential/Platelet - Comprehensive metabolic panel - Lipid panel  2. Hyperlipidemia, unspecified hyperlipidemia type .Encouraged heart healthy diet, increase exercise, avoid trans fats, consider a krill oil cap daily - CBC with Differential/Platelet - Comprehensive metabolic panel - Lipid panel  3. Estrogen deficiency   - DG Bone Density; Future  4. Morbid obesity due to excess calories ([facility])   - Amb  Ref to Medical Weight Management

## 2018-08-22 NOTE — Patient Instructions (Signed)
Preventive Care 65 Years and Older, Female Preventive care refers to lifestyle choices and visits with your health care provider that can promote health and wellness. What does preventive care include?  A yearly physical exam. This is also called an annual well check.  Dental exams once or twice a year.  Routine eye exams. Ask your health care provider how often you should have your eyes checked.  Personal lifestyle choices, including: ? Daily care of your teeth and gums. ? Regular physical activity. ? Eating a healthy diet. ? Avoiding tobacco and drug use. ? Limiting alcohol use. ? Practicing safe sex. ? Taking low-dose aspirin every day. ? Taking vitamin and mineral supplements as recommended by your health care provider. What happens during an annual well check? The services and screenings done by your health care provider during your annual well check will depend on your age, overall health, lifestyle risk factors, and family history of disease. Counseling Your health care provider may ask you questions about your:  Alcohol use.  Tobacco use.  Drug use.  Emotional well-being.  Home and relationship well-being.  Sexual activity.  Eating habits.  History of falls.  Memory and ability to understand (cognition).  Work and work environment.  Reproductive health.  Screening You may have the following tests or measurements:  Height, weight, and BMI.  Blood pressure.  Lipid and cholesterol levels. These may be checked every 5 years, or more frequently if you are over 50 years old.  Skin check.  Lung cancer screening. You may have this screening every year starting at age 55 if you have a 30-pack-year history of smoking and currently smoke or have quit within the past 15 years.  Fecal occult blood test (FOBT) of the stool. You may have this test every year starting at age 50.  Flexible sigmoidoscopy or colonoscopy. You may have a sigmoidoscopy every 5 years or  a colonoscopy every 10 years starting at age 50.  Hepatitis C blood test.  Hepatitis B blood test.  Sexually transmitted disease (STD) testing.  Diabetes screening. This is done by checking your blood sugar (glucose) after you have not eaten for a while (fasting). You may have this done every 1-3 years.  Bone density scan. This is done to screen for osteoporosis. You may have this done starting at age 65.  Mammogram. This may be done every 1-2 years. Talk to your health care provider about how often you should have regular mammograms.  Talk with your health care provider about your test results, treatment options, and if necessary, the need for more tests. Vaccines Your health care provider may recommend certain vaccines, such as:  Influenza vaccine. This is recommended every year.  Tetanus, diphtheria, and acellular pertussis (Tdap, Td) vaccine. You may need a Td booster every 10 years.  Varicella vaccine. You may need this if you have not been vaccinated.  Zoster vaccine. You may need this after age 60.  Measles, mumps, and rubella (MMR) vaccine. You may need at least one dose of MMR if you were born in 1957 or later. You may also need a second dose.  Pneumococcal 13-valent conjugate (PCV13) vaccine. One dose is recommended after age 65.  Pneumococcal polysaccharide (PPSV23) vaccine. One dose is recommended after age 65.  Meningococcal vaccine. You may need this if you have certain conditions.  Hepatitis A vaccine. You may need this if you have certain conditions or if you travel or work in places where you may be exposed to hepatitis   A.  Hepatitis B vaccine. You may need this if you have certain conditions or if you travel or work in places where you may be exposed to hepatitis B.  Haemophilus influenzae type b (Hib) vaccine. You may need this if you have certain conditions.  Talk to your health care provider about which screenings and vaccines you need and how often you  need them. This information is not intended to replace advice given to you by your health care provider. Make sure you discuss any questions you have with your health care provider. Document Released: 01/13/2016 Document Revised: 09/05/2016 Document Reviewed: 10/18/2015 Elsevier Interactive Patient Education  2018 [employer]  

## 2018-09-08 LAB — HM DIABETES EYE EXAM

## 2018-09-15 ENCOUNTER — Ambulatory Visit (HOSPITAL_BASED_OUTPATIENT_CLINIC_OR_DEPARTMENT_OTHER)
Admission: RE | Admit: 2018-09-15 | Discharge: 2018-09-15 | Disposition: A | Discharge status: Home / Self Care | Payer: Medicare Other | Source: Ambulatory Visit | Attending: Family Medicine | Admitting: Family Medicine

## 2018-09-15 ENCOUNTER — Ambulatory Visit (INDEPENDENT_AMBULATORY_CARE_PROVIDER_SITE_OTHER): Payer: Medicare Other

## 2018-09-15 ENCOUNTER — Ambulatory Visit: Payer: Medicare Other

## 2018-09-15 DIAGNOSIS — E2839 Other primary ovarian failure: Secondary | ICD-10-CM | POA: Diagnosis not present

## 2018-09-15 DIAGNOSIS — Z23 Encounter for immunization: Secondary | ICD-10-CM | POA: Diagnosis not present

## 2018-09-29 ENCOUNTER — Other Ambulatory Visit (INDEPENDENT_AMBULATORY_CARE_PROVIDER_SITE_OTHER): Payer: Medicare Other

## 2018-09-29 DIAGNOSIS — E1165 Type 2 diabetes mellitus with hyperglycemia: Secondary | ICD-10-CM | POA: Diagnosis not present

## 2018-09-29 DIAGNOSIS — Z794 Long term (current) use of insulin: Secondary | ICD-10-CM

## 2018-09-29 LAB — BASIC METABOLIC PANEL
BUN: 36 mg/dL — AB (ref 6–23)
CHLORIDE: 101 meq/L (ref 96–112)
CO2: 23 mEq/L (ref 19–32)
CREATININE: 1.43 mg/dL — AB (ref 0.40–1.20)
Calcium: 9.8 mg/dL (ref 8.4–10.5)
GFR: 37.36 mL/min — AB (ref >60.00)
Glucose, Bld: 163 mg/dL — ABNORMAL HIGH (ref 70–99)
POTASSIUM: 4.2 meq/L (ref 3.5–5.1)
Sodium: 136 mEq/L (ref 135–145)

## 2018-09-29 LAB — HEMOGLOBIN A1C: HEMOGLOBIN A1C: 6.7 % — AB (ref 4.6–6.5)

## 2018-10-02 ENCOUNTER — Encounter: Payer: Self-pay | Admitting: Endocrinology

## 2018-10-02 ENCOUNTER — Ambulatory Visit: Payer: Medicare Other | Admitting: Endocrinology

## 2018-10-02 VITALS — BP 128/72 | HR 76 | Ht 64.0 in | Wt 280.0 lb

## 2018-10-02 DIAGNOSIS — E1169 Type 2 diabetes mellitus with other specified complication: Secondary | ICD-10-CM

## 2018-10-02 DIAGNOSIS — E669 Obesity, unspecified: Secondary | ICD-10-CM | POA: Diagnosis not present

## 2018-10-02 NOTE — Patient Instructions (Addendum)
Check blood sugars on waking up 4  days a week  Also check blood sugars about 2 hours after meals and do this after different meals by rotation  Recommended blood sugar levels on waking up are 90-130 and about 2 hours after meal is 130-160  Please bring your blood sugar monitor to each visit, thank you  

## 2018-10-02 NOTE — Progress Notes (Signed)
Patient ID: Katelyn Mahoney, female   DOB: Aug 22, 1934, 84 y.o.   MRN: [id number]            Reason for Appointment: Follow-up for Type 2 Diabetes  Referring physician: Etter Sjogren   History of Present Illness:          Date of diagnosis of type 2 diabetes mellitus: ?  1983        Background history:   She had previously been on metformin with fairly good control for some time She was also at some point treated with Janumet Apparently subsequently she could not tolerate metformin and had diarrhea This was switched to glipizide about 2 years ago and she is also being treated with Januvia and Chana Bode was started in 9/17  Review of A1c indicates that her levels had been significantly high since 2016 She was initially seen in consultation on 02/27/17  Recent history:       Non-insulin hypoglycemic drugs the patient is taking are: Ozempic 0.5 mg weekly, glipizide ER 10 mg 1 x a day  INSULIN regimen: Tresiba 26 units at supper, Humalog 7 units at supper and 5 units breakfast  A1c is now improved at 6.7, previously 7.1  Current management, blood sugar patterns and problems identified:  She has brought her monitor for download today  Her Tyler Aas was increased on the last visit and fasting readings are improved  However she does not check readings after meals much and only sporadically in the afternoon when though she is not taking insulin at lunchtime  She takes relatively large amount of Humalog at suppertime but does not adjust it based on what she is eating  Also blood sugars around lunchtime and afternoon are usually fairly good  Has had a little weight gain recently        Side effects from medications have been: Diarrhea from metformin  Compliance with the medical regimen: Fair  Glucose monitoring:  done 1 +  times a day         Glucometer: One Touch Verio       Blood Glucose readings by time of day by analysis of her blood sugar monitor download:   PRE-MEAL  Fasting Lunch Dinner Bedtime Overall  Glucose range:  77-156      Mean/median:  114    116   POST-MEAL PC Breakfast PC Lunch PC Dinner  Glucose range:   ?  Mean/median:   136   Previous readings:   Mean values apply above for all meters except median for One Touch  PRE-MEAL Fasting Lunch Dinner Bedtime Overall  Glucose range:  123-177     123-206   Mean/median:  136  168   145   POST-MEAL PC Breakfast PC Lunch PC Dinner  Glucose range:    148, 166  Mean/median:         Self-care:  Meal times are:  Breakfast is at 9-10 AM Typical meal intake: Breakfast is frequently cheese toast.  Lunch is variable or nothing.  Meals are usually chicken, salad, fish, sometimes vegetables only.  Snacks: Peanuts, yogurt                Dietician visit, most recent:1/19                Exercise:  None, not able to do much Physical activity  Weight history: Previous range 249-305  Wt Readings from Last 3 Encounters:  10/02/18 280 lb (127 kg)  08/22/18 276 lb 12.8 oz (125.6  kg)  07/01/18 275 lb (124.7 kg)    Glycemic control:   Lab Results  Component Value Date   HGBA1C 6.7 (H) 09/29/2018   HGBA1C 7.1 (H) 06/26/2018   HGBA1C 7.1 (H) 03/27/2018   Lab Results  Component Value Date   MICROALBUR 3.7 (H) 06/26/2018   LDLCALC 80 08/22/2018   CREATININE 1.43 (H) 09/29/2018   Lab Results  Component Value Date   MICRALBCREAT 3.1 06/26/2018   Lab Results  Component Value Date   FRUCTOSAMINE 301 (H) 09/19/2017   FRUCTOSAMINE 312 (H) 06/20/2017   FRUCTOSAMINE 339 (H) 03/27/2017       Allergies as of 10/02/2018      Reactions   Bupropion Hcl    Palmar rash   Penicillins    rash   Citalopram Hydrobromide Other (See Comments)   Unknown   Metformin And Related Diarrhea      Medication List        Accurate as of 10/02/18 11:59 PM. Always use your most recent med list.          amLODipine 5 MG tablet Commonly known as:  NORVASC Take 1 tablet (5 mg total) by mouth daily.     aspirin 81 MG tablet Take 81 mg by mouth. 3 by mouth in the evening   atorvastatin 20 MG tablet Commonly known as:  LIPITOR TAKE ONE-HALF (1/2) TABLET ON MONDAY, WEDNESDAY, FRIDAY AND SUNDAY   BILBERRY PO Take 470 mg by mouth. 2 by mouth daily   CLARITIN 10 MG Caps Generic drug:  Loratadine Take 1 capsule by mouth daily as needed.   FLECTOR 1.3 % Ptch Generic drug:  diclofenac APPLY 1 PATCH ONTO THE SKIN TWICE A DAY   fluocinonide cream 0.05 % Commonly known as:  LIDEX Apply 1 application topically 2 (two) times daily.   furosemide 20 MG tablet Commonly known as:  LASIX TAKE 1 TABLET DAILY   glipiZIDE 10 MG 24 hr tablet Commonly known as:  GLUCOTROL XL Take 1 tablet (10 mg total) by mouth 2 (two) times daily.   GLUCOSAMINE PO Take by mouth daily.   glucose blood test strip Use to test blood sugar twice a day.  DX: E11.21   insulin lispro 100 UNIT/ML KiwkPen Commonly known as:  HUMALOG INJECT 5 UNITS AT MEAL TIMES (MAX OF 20 UNITS PER DAY)   Insulin Pen Needle 29G X 5MM Misc Use to inject insulin into skin 1-3 times daily   Insulin Pen Needle 32G X 4 MM Misc USE TO INJECT INSULIN INTO THE SKIN 1-3 TIMES DAILY   Insulin Pen Needle 32G X 4 MM Misc USE TO INJECT INSULIN INTO THE SKIN 1-3 TIMES DAILY   metoprolol tartrate 100 MG tablet Commonly known as:  LOPRESSOR Take 100 mg by mouth 2 (two) times daily. Take 1 tablet in the morning and 1/2 tablet at night   multivitamin-lutein Caps capsule Take 1 capsule by mouth daily.   HAIR/SKIN/NAILS Tabs TAKE 1 TABLET DAILY   nystatin ointment Commonly known as:  MYCOSTATIN Apply 1 application topically 2 (two) times daily.   ONETOUCH DELICA LANCETS 54Y Misc Use to test blood sugar once daily- E11.21   Semaglutide(0.25 or 0.5MG /DOS) 2 MG/1.5ML Sopn Inject 0.5 mg into the skin once a week.   sertraline 100 MG tablet Commonly known as:  ZOLOFT TAKE 1 TABLET DAILY   TRESIBA FLEXTOUCH 100 UNIT/ML Sopn  FlexTouch Pen Generic drug:  insulin degludec INJECT 26 UNITS UNDER THE SKIN DAILY  TYLENOL ARTHRITIS PAIN 650 MG CR tablet Generic drug:  acetaminophen 2 by mouth once daily as needed       Allergies:  Allergies  Allergen Reactions  . Bupropion Hcl     Palmar rash  . Penicillins     rash  . Citalopram Hydrobromide Other (See Comments)    Unknown  . Metformin And Related Diarrhea    Past Medical History:  Diagnosis Date  . Allergy   . Anxiety   . Arthritis   . Arthritis   . Callus   . Depression   . Diabetes mellitus without complication ([place])   . Hyperlipidemia   . Hypertension     Past Surgical History:  Procedure Laterality Date  . ABDOMINAL HYSTERECTOMY    . CHOLECYSTECTOMY    . SPLENECTOMY      Family History  Problem Relation Age of Onset  . Cancer Mother   . Diabetes Mother   . Cancer Father   . Diabetes Father   . Cancer Sister   . Diabetes Sister   . Cancer Brother   . Brain cancer Brother   . Diabetes Brother   . Diabetes Maternal Grandmother   . Diabetes Paternal Grandmother   . Cancer - Lung Brother   . Diabetes Brother     Social History:  reports that she has never smoked. She has never used smokeless tobacco. She reports that she does not drink alcohol or use drugs.   Review of Systems   Lipid history: LDL controlled with 20 mg Lipitor     Lab Results  Component Value Date   CHOL 161 08/22/2018   HDL 58.40 08/22/2018   LDLCALC 80 08/22/2018   LDLDIRECT 78.3 04/16/2013   TRIG 116.0 08/22/2018   CHOLHDL 3 08/22/2018           Hypertension: Currently on  Metoprolol and amlodipine, was recommended lower dose of metoprolol because of hyperkalemia  Lasix is used only as needed  CKD: Mild and recently unchanged  This is followed by nephrologist   HYPERKALEMIA: Potassium is controlled Has been given low potassium diet by nephrologist     Lab Results  Component Value Date   CREATININE 1.43 (H) 09/29/2018    CREATININE 1.42 (H) 08/22/2018   CREATININE 1.43 (H) 06/26/2018   CREATININE 1.25 (H) 03/27/2018   Lab Results  Component Value Date   K 4.2 09/29/2018    Most recent foot exam: 03/2018  Eye 9/19  She does have peripheral neuropathy with numbness   LABS:  Lab on 09/29/2018  Component Date Value Ref Range Status  . Sodium 09/29/2018 136  135 - 145 mEq/L Final  . Potassium 09/29/2018 4.2  3.5 - 5.1 mEq/L Final  . Chloride 09/29/2018 101  96 - 112 mEq/L Final  . CO2 09/29/2018 23  19 - 32 mEq/L Final  . Glucose, Bld 09/29/2018 163* 70 - 99 mg/dL Final  . BUN 09/29/2018 36* 6 - 23 mg/dL Final  . Creatinine, Ser 09/29/2018 1.43* 0.40 - 1.20 mg/dL Final  . Calcium 09/29/2018 9.8  8.4 - 10.5 mg/dL Final  . GFR 09/29/2018 37.36* >60.00 mL/min Final  . Hgb A1c MFr Bld 09/29/2018 6.7* 4.6 - 6.5 % Final   Glycemic Control Guidelines for People with Diabetes:Non Diabetic:  <6%Goal of Therapy: <7%Additional Action Suggested:  >8%     Physical Examination:  BP 128/72   Pulse 76   Ht 5\' 4"  (1.626 m)   Wt 280 lb (127 kg)  SpO2 96%   BMI 48.06 kg/m      No ankle edema    ASSESSMENT:  Diabetes type 2, uncontrolled with morbid obesity, insulin requiring  See history of present illness for detailed discussion of current diabetes management, blood sugar patterns and problems identified   Her A1c is excellent now at 6.7  With increasing her Tyler Aas her fasting readings are near normal without hypoglycemia Also benefiting from [place] but not losing weight Discussed that since she is taking mealtime coverage at breakfast and supper she does need to check readings after those meals and is not monitoring after supper usually Recently highest blood sugar is 185 after lunch and she does not cover her lunch meal if she has a larger intake at that time  HYPERTENSION/mild CKD: Well-controlled, continue follow-up with nephrologist and PCP   PLAN:    Discussed checking blood sugars  by rotation at different times and not as much in the morning  If she has a larger meal at lunchtime she will take 5 to 7 units to cover this  Currently no change in insulin required on this morning sugars start getting lower which they have not been the last 3 tests  She will continue the reduced dose of 1 tablet glipizide       Patient Instructions  Check blood sugars on waking up 4 days a week   Also check blood sugars about 2 hours after meals and do this after different meals by rotation  Recommended blood sugar levels on waking up are 90-130 and about 2 hours after meal is 130-160  Please bring your blood sugar monitor to each visit, thank you     Elayne Snare 10/03/2018, 11:34 AM   Note: This office note was prepared with Dragon voice recognition system technology. Any transcriptional errors that result from this process are unintentional.

## 2018-10-13 ENCOUNTER — Other Ambulatory Visit: Payer: Self-pay | Admitting: Family Medicine

## 2018-10-16 ENCOUNTER — Encounter (INDEPENDENT_AMBULATORY_CARE_PROVIDER_SITE_OTHER): Payer: Medicare Other

## 2018-10-19 ENCOUNTER — Other Ambulatory Visit: Payer: Self-pay | Admitting: Family Medicine

## 2018-10-19 DIAGNOSIS — E785 Hyperlipidemia, unspecified: Secondary | ICD-10-CM

## 2018-10-21 ENCOUNTER — Ambulatory Visit (INDEPENDENT_AMBULATORY_CARE_PROVIDER_SITE_OTHER): Payer: Medicare Other | Admitting: Family Medicine

## 2018-10-21 ENCOUNTER — Other Ambulatory Visit: Payer: Self-pay | Admitting: Endocrinology

## 2018-10-26 ENCOUNTER — Other Ambulatory Visit: Payer: Self-pay | Admitting: Family Medicine

## 2018-11-04 ENCOUNTER — Other Ambulatory Visit: Payer: Self-pay

## 2018-11-04 ENCOUNTER — Telehealth: Payer: Self-pay | Admitting: Endocrinology

## 2018-11-04 MED ORDER — INSULIN DEGLUDEC 100 UNIT/ML ~~LOC~~ SOPN: PEN_INJECTOR | SUBCUTANEOUS | 1 refills | Status: DC

## 2018-11-04 NOTE — Telephone Encounter (Signed)
This has been sent

## 2018-11-04 NOTE — Telephone Encounter (Signed)
Patient requests RX for:  TRESIBA FLEXTOUCH 100 UNIT/ML SOPN FlexTouch Pen  Be sent to:  [place], [place]

## 2018-11-10 ENCOUNTER — Encounter: Payer: Self-pay | Admitting: *Deleted

## 2018-11-11 ENCOUNTER — Other Ambulatory Visit: Payer: Self-pay

## 2018-11-11 MED ORDER — INSULIN DEGLUDEC 100 UNIT/ML ~~LOC~~ SOPN: PEN_INJECTOR | SUBCUTANEOUS | 1 refills | Status: DC

## 2018-11-18 ENCOUNTER — Other Ambulatory Visit: Payer: Self-pay | Admitting: Endocrinology

## 2018-11-30 ENCOUNTER — Other Ambulatory Visit: Payer: Self-pay | Admitting: Family Medicine

## 2018-11-30 DIAGNOSIS — R600 Localized edema: Secondary | ICD-10-CM

## 2018-12-16 ENCOUNTER — Other Ambulatory Visit: Payer: Self-pay | Admitting: Endocrinology

## 2019-01-27 ENCOUNTER — Other Ambulatory Visit (INDEPENDENT_AMBULATORY_CARE_PROVIDER_SITE_OTHER): Payer: Medicare Other

## 2019-01-27 DIAGNOSIS — E1169 Type 2 diabetes mellitus with other specified complication: Secondary | ICD-10-CM

## 2019-01-27 DIAGNOSIS — E669 Obesity, unspecified: Secondary | ICD-10-CM

## 2019-01-27 LAB — COMPREHENSIVE METABOLIC PANEL
ALBUMIN: 3.7 g/dL (ref 3.5–5.2)
ALT: 21 U/L (ref 0–35)
AST: 21 U/L (ref 0–37)
Alkaline Phosphatase: 71 U/L (ref 39–117)
BUN: 41 mg/dL — AB (ref 6–23)
CHLORIDE: 100 meq/L (ref 96–112)
CO2: 24 mEq/L (ref 19–32)
CREATININE: 1.48 mg/dL — AB (ref 0.40–1.20)
Calcium: 9.5 mg/dL (ref 8.4–10.5)
GFR: 33.75 mL/min — ABNORMAL LOW (ref >60.00)
Glucose, Bld: 137 mg/dL — ABNORMAL HIGH (ref 70–99)
Potassium: 4.9 mEq/L (ref 3.5–5.1)
Sodium: 132 mEq/L — ABNORMAL LOW (ref 135–145)
Total Bilirubin: 0.7 mg/dL (ref 0.2–1.2)
Total Protein: 7 g/dL (ref 6.0–8.3)

## 2019-01-27 LAB — HEMOGLOBIN A1C: Hgb A1c MFr Bld: 7.1 % — ABNORMAL HIGH (ref 4.6–6.5)

## 2019-01-29 ENCOUNTER — Other Ambulatory Visit: Payer: Self-pay | Admitting: Endocrinology

## 2019-02-01 NOTE — Progress Notes (Signed)
Patient ID: Katelyn Mahoney, female   DOB: 1934/06/06, 85 y.o.   MRN: [id number]            Reason for Appointment: Follow-up for Type 2 Diabetes  Referring physician: Etter Sjogren   History of Present Illness:          Date of diagnosis of type 2 diabetes mellitus: ?  1983        Background history:   She had previously been on metformin with fairly good control for some time She was also at some point treated with Janumet Apparently subsequently she could not tolerate metformin and had diarrhea This was switched to glipizide about 2 years ago and she is also being treated with Januvia and Chana Bode was started in 9/17  Review of A1c indicates that her levels had been significantly high since 2016 She was initially seen in consultation on 02/27/17  Recent history:       Non-insulin hypoglycemic drugs the patient is taking are: Ozempic 0.5 mg weekly, glipizide ER 10 mg 1 x a day  INSULIN regimen: Tresiba 26 units at supper, Humalog 7 units at supper and 5 units breakfast  A1c is now  7.1, was 6.7  Current management, blood sugar patterns and problems identified:  She appears to have higher readings compared to her last visit  Although she thinks she may not have been consistent with her diet in December her blood sugar still looks relatively high  However she does not have blood sugars monitored after meals especially dinner which has been discussed on the previous visits  She was previously having readings as low as 77 fasting but they are now averaging about 130-140 with some fluctuation  Again not able to lose weight despite taking Ozempic which she is taking regularly  She is in blood sugars may be higher after meals if she is eating more carbohydrate, does not always eat large amounts like bread at dinnertime  Lunch is usually very low in carbohydrate and more of a snack or a salad  She thinks she is taking her Humalog consistently before eating        Side  effects from medications have been: Diarrhea from metformin  Compliance with the medical regimen: Fair  Glucose monitoring:  done 1 +  times a day         Glucometer: One Touch Verio       Blood Glucose readings by time of day by analysis of her blood sugar monitor download:   PRE-MEAL Fasting Lunch Dinner Bedtime Overall  Glucose range:  106-167   188   Mean/median: 13 8      144   POST-MEAL PC Breakfast PC Lunch PC Dinner  Glucose range:  111-155  145   Mean/median:  134     PREVIOUS readings:  PRE-MEAL Fasting Lunch Dinner Bedtime Overall  Glucose range:  77-156      Mean/median:  114    116   POST-MEAL PC Breakfast PC Lunch PC Dinner  Glucose range:   ?  Mean/median:   136       Self-care:  Meal times are:  Breakfast is at 9-10 AM Typical meal intake: Breakfast is frequently cheese toast.  Lunch is variable or nothing.  Meals are usually chicken, salad, fish, sometimes vegetables only.  Snacks: Peanuts, yogurt                Dietician visit, most recent:1/19  Exercise:  None, not able to do much Physical activity  Weight history: Previous range 249-305  Wt Readings from Last 3 Encounters:  02/02/19 280 lb 9.6 oz (127.3 kg)  10/02/18 280 lb (127 kg)  08/22/18 276 lb 12.8 oz (125.6 kg)    Glycemic control:   Lab Results  Component Value Date   HGBA1C 7.1 (H) 01/27/2019   HGBA1C 6.7 (H) 09/29/2018   HGBA1C 7.1 (H) 06/26/2018   Lab Results  Component Value Date   MICROALBUR 3.7 (H) 06/26/2018   LDLCALC 80 08/22/2018   CREATININE 1.48 (H) 01/27/2019   Lab Results  Component Value Date   MICRALBCREAT 3.1 06/26/2018   Lab Results  Component Value Date   FRUCTOSAMINE 301 (H) 09/19/2017   FRUCTOSAMINE 312 (H) 06/20/2017   FRUCTOSAMINE 339 (H) 03/27/2017       Allergies as of 02/02/2019      Reactions   Bupropion Hcl    Palmar rash   Penicillins    rash   Citalopram Hydrobromide Other (See Comments)   Unknown   Metformin And  Related Diarrhea      Medication List       Accurate as of February 02, 2019  8:57 AM. Always use your most recent med list.        amLODipine 5 MG tablet Commonly known as:  NORVASC TAKE 1 TABLET DAILY   aspirin 81 MG tablet Take 81 mg by mouth. 3 by mouth in the evening   atorvastatin 20 MG tablet Commonly known as:  LIPITOR TAKE ONE-HALF (1/2) TABLET ON MONDAY, WEDNESDAY, FRIDAY AND SUNDAY   BILBERRY PO Take 470 mg by mouth. 2 by mouth daily   CLARITIN 10 MG Caps Generic drug:  Loratadine Take 1 capsule by mouth daily as needed.   FLECTOR 1.3 % Ptch Generic drug:  diclofenac APPLY 1 PATCH ONTO THE SKIN TWICE A DAY   fluocinonide cream 0.05 % Commonly known as:  LIDEX Apply 1 application topically 2 (two) times daily.   furosemide 20 MG tablet Commonly known as:  LASIX TAKE 1 TABLET DAILY   glipiZIDE 10 MG 24 hr tablet Commonly known as:  GLUCOTROL XL Take 1 tablet (10 mg total) by mouth 2 (two) times daily.   GLUCOSAMINE PO Take by mouth daily.   glucose blood test strip Commonly known as:  ONETOUCH VERIO Use to test blood sugar twice a day.  DX: E11.21   HUMALOG KWIKPEN 100 UNIT/ML KwikPen Generic drug:  insulin lispro INJECT 5 UNITS AT MEAL TIMES (MAX OF 20 UNITS PER DAY)   insulin degludec 100 UNIT/ML Sopn FlexTouch Pen Commonly known as:  TRESIBA FLEXTOUCH INJECT 26 UNITS UNDER THE SKIN DAILY   Insulin Pen Needle 29G X 5MM Misc Use to inject insulin into skin 1-3 times daily   Insulin Pen Needle 32G X 4 MM Misc Commonly known as:  BD PEN NEEDLE NANO U/F USE TO INJECT INSULIN INTO THE SKIN 1-3 TIMES DAILY   metoprolol tartrate 100 MG tablet Commonly known as:  LOPRESSOR Take 100 mg by mouth 2 (two) times daily. Take 1 tablet in the morning and 1/2 tablet at night   multivitamin-lutein Caps capsule Take 1 capsule by mouth daily.   HAIR/SKIN/NAILS Tabs TAKE 1 TABLET DAILY   nystatin ointment Commonly known as:  MYCOSTATIN Apply 1  application topically 2 (two) times daily.   ONETOUCH DELICA LANCETS 86V Misc Use to test blood sugar once daily- E11.21   OZEMPIC (0.25 OR 0.5  MG/DOSE) 2 MG/1.5ML Sopn Generic drug:  Semaglutide(0.25 or 0.5MG /DOS) INJECT 0.5 MG INTO THE SKIN ONCE A WEEK   sertraline 100 MG tablet Commonly known as:  ZOLOFT TAKE 1 TABLET DAILY   TYLENOL ARTHRITIS PAIN 650 MG CR tablet Generic drug:  acetaminophen 2 by mouth once daily as needed       Allergies:  Allergies  Allergen Reactions  . Bupropion Hcl     Palmar rash  . Penicillins     rash  . Citalopram Hydrobromide Other (See Comments)    Unknown  . Metformin And Related Diarrhea    Past Medical History:  Diagnosis Date  . Allergy   . Anxiety   . Arthritis   . Arthritis   . Callus   . Depression   . Diabetes mellitus without complication ([place])   . Hyperlipidemia   . Hypertension     Past Surgical History:  Procedure Laterality Date  . ABDOMINAL HYSTERECTOMY    . CHOLECYSTECTOMY    . SPLENECTOMY      Family History  Problem Relation Age of Onset  . Cancer Mother   . Diabetes Mother   . Cancer Father   . Diabetes Father   . Cancer Sister   . Diabetes Sister   . Cancer Brother   . Brain cancer Brother   . Diabetes Brother   . Diabetes Maternal Grandmother   . Diabetes Paternal Grandmother   . Cancer - Lung Brother   . Diabetes Brother     Social History:  reports that she has never smoked. She has never used smokeless tobacco. She reports that she does not drink alcohol or use drugs.   Review of Systems   Lipid history: LDL controlled with 20 mg Lipitor    Lab Results  Component Value Date   CHOL 161 08/22/2018   HDL 58.40 08/22/2018   LDLCALC 80 08/22/2018   LDLDIRECT 78.3 04/16/2013   TRIG 116.0 08/22/2018   CHOLHDL 3 08/22/2018           Hypertension: Currently on  Metoprolol and amlodipine, was recommended lower dose of metoprolol because of hyperkalemia  Lasix is used only as  needed  CKD: Mild and recently stable  This is followed by nephrologist   HYPERKALEMIA: Potassium is still normal Has been given low potassium diet by nephrologist     Lab Results  Component Value Date   CREATININE 1.48 (H) 01/27/2019   CREATININE 1.43 (H) 09/29/2018   CREATININE 1.42 (H) 08/22/2018   CREATININE 1.43 (H) 06/26/2018   Lab Results  Component Value Date   K 4.9 01/27/2019    Most recent foot exam: 03/2018  Eye 9/19  She does have peripheral neuropathy with numbness   LABS:  Lab on 01/27/2019  Component Date Value Ref Range Status  . Hgb A1c MFr Bld 01/27/2019 7.1* 4.6 - 6.5 % Final   Glycemic Control Guidelines for People with Diabetes:Non Diabetic:  <6%Goal of Therapy: <7%Additional Action Suggested:  >8%   . Sodium 01/27/2019 132* 135 - 145 mEq/L Final  . Potassium 01/27/2019 4.9  3.5 - 5.1 mEq/L Final  . Chloride 01/27/2019 100  96 - 112 mEq/L Final  . CO2 01/27/2019 24  19 - 32 mEq/L Final  . Glucose, Bld 01/27/2019 137* 70 - 99 mg/dL Final  . BUN 01/27/2019 41* 6 - 23 mg/dL Final  . Creatinine, Ser 01/27/2019 1.48* 0.40 - 1.20 mg/dL Final  . Total Bilirubin 01/27/2019 0.7  0.2 - 1.2 mg/dL  Final  . Alkaline Phosphatase 01/27/2019 71  39 - 117 U/L Final  . AST 01/27/2019 21  0 - 37 U/L Final  . ALT 01/27/2019 21  0 - 35 U/L Final  . Total Protein 01/27/2019 7.0  6.0 - 8.3 g/dL Final  . Albumin 01/27/2019 3.7  3.5 - 5.2 g/dL Final  . Calcium 01/27/2019 9.5  8.4 - 10.5 mg/dL Final  . GFR 01/27/2019 33.75* >60.00 mL/min Final    Physical Examination:  BP 122/68 (BP Location: Right Arm, Patient Position: Sitting, Cuff Size: Large)   Pulse 97   Ht 5\' 4"  (1.626 m)   Wt 280 lb 9.6 oz (127.3 kg)   SpO2 98%   BMI 48.16 kg/m     Her feet are normal to visual exam including plantar surfaces  ASSESSMENT:  Diabetes type 2, uncontrolled with morbid obesity, insulin requiring  See history of present illness for detailed discussion of current  diabetes management, blood sugar patterns and problems identified   Her A1c is relatively higher at 7.1  However considering her age and comorbid conditions her control is still fairly good She likely has some higher readings after meals, highest reading 188 at bedtime and within the last month She probably can do better with adjusting her suppertime dose based on her meal size and carbohydrate content but likely does not need any increase in basal insulin since fasting readings can be as low as 106  HYPERTENSION/mild CKD: controlled, this is followed by PCP and nephrologist Mild hyponatremia: Etiology unclear    PLAN:    Discussed adjusting her Humalog by 2 to 3 units at suppertime if eating more carbohydrates like bread or a dessert  Will not change her basic insulin regimen as yet  She needs to keep a check on her blood sugars 2 to 3 hours after evening meal consistently and alternate with fasting readings  If her blood sugars are A1c increased may consider increasing Ozempic     There are no Patient Instructions on file for this visit.   Elayne Snare 02/02/2019, 8:57 AM   Note: This office note was prepared with Dragon voice recognition system technology. Any transcriptional errors that result from this process are unintentional.

## 2019-02-02 ENCOUNTER — Encounter: Payer: Self-pay | Admitting: Endocrinology

## 2019-02-02 ENCOUNTER — Ambulatory Visit: Payer: Medicare Other | Admitting: Endocrinology

## 2019-02-02 VITALS — BP 122/68 | HR 97 | Ht 64.0 in | Wt 280.6 lb

## 2019-02-02 DIAGNOSIS — I10 Essential (primary) hypertension: Secondary | ICD-10-CM | POA: Diagnosis not present

## 2019-02-02 DIAGNOSIS — E1165 Type 2 diabetes mellitus with hyperglycemia: Secondary | ICD-10-CM

## 2019-02-02 DIAGNOSIS — E871 Hypo-osmolality and hyponatremia: Secondary | ICD-10-CM

## 2019-02-02 DIAGNOSIS — Z794 Long term (current) use of insulin: Secondary | ICD-10-CM

## 2019-02-02 DIAGNOSIS — N189 Chronic kidney disease, unspecified: Secondary | ICD-10-CM | POA: Diagnosis not present

## 2019-02-02 NOTE — Patient Instructions (Signed)
Check blood sugars on waking up 3-4 days a week  Also check blood sugars about 2 hours after meals and do this after different meals by rotation  Recommended blood sugar levels on waking up are 90-130 and about 2-3 hours after meal is 130-180  Please bring your blood sugar monitor to each visit, thank you

## 2019-02-10 ENCOUNTER — Other Ambulatory Visit: Payer: Self-pay | Admitting: Endocrinology

## 2019-02-27 ENCOUNTER — Encounter: Payer: Self-pay | Admitting: Family Medicine

## 2019-02-27 ENCOUNTER — Ambulatory Visit: Payer: Medicare Other | Admitting: Family Medicine

## 2019-02-27 VITALS — BP 124/84 | HR 54 | Resp 12 | Ht 64.0 in | Wt 282.0 lb

## 2019-02-27 DIAGNOSIS — Z8349 Family history of other endocrine, nutritional and metabolic diseases: Secondary | ICD-10-CM | POA: Diagnosis not present

## 2019-02-27 DIAGNOSIS — D729 Disorder of white blood cells, unspecified: Secondary | ICD-10-CM

## 2019-02-27 DIAGNOSIS — E785 Hyperlipidemia, unspecified: Secondary | ICD-10-CM | POA: Diagnosis not present

## 2019-02-27 DIAGNOSIS — E1169 Type 2 diabetes mellitus with other specified complication: Secondary | ICD-10-CM

## 2019-02-27 DIAGNOSIS — I10 Essential (primary) hypertension: Secondary | ICD-10-CM

## 2019-02-27 LAB — CBC WITH DIFFERENTIAL/PLATELET
Basophils Absolute: 0.1 10*3/uL (ref 0.0–0.1)
Basophils Relative: 1.1 % (ref 0.0–3.0)
EOS PCT: 3 % (ref 0.0–5.0)
Eosinophils Absolute: 0.4 10*3/uL (ref 0.0–0.7)
HCT: 45.6 % (ref 36.0–46.0)
Hemoglobin: 15.9 g/dL — ABNORMAL HIGH (ref 12.0–15.0)
Lymphocytes Relative: 28 % (ref 12.0–46.0)
Lymphs Abs: 3.8 10*3/uL (ref 0.7–4.0)
MCHC: 34.8 g/dL (ref 30.0–36.0)
MCV: 99 fl (ref 78.0–100.0)
MONOS PCT: 11.6 % (ref 3.0–12.0)
Monocytes Absolute: 1.6 10*3/uL — ABNORMAL HIGH (ref 0.1–1.0)
Neutro Abs: 7.7 10*3/uL (ref 1.4–7.7)
Neutrophils Relative %: 56.3 % (ref 43.0–77.0)
Platelets: 361 10*3/uL (ref 150.0–400.0)
RBC: 4.61 Mil/uL (ref 3.87–5.11)
RDW: 16.6 % — ABNORMAL HIGH (ref 11.5–15.5)
WBC: 13.7 10*3/uL — ABNORMAL HIGH (ref 4.0–10.5)

## 2019-02-27 LAB — COMPREHENSIVE METABOLIC PANEL
ALT: 22 U/L (ref 0–35)
AST: 21 U/L (ref 0–37)
Albumin: 3.9 g/dL (ref 3.5–5.2)
Alkaline Phosphatase: 84 U/L (ref 39–117)
BUN: 35 mg/dL — ABNORMAL HIGH (ref 6–23)
CO2: 23 mEq/L (ref 19–32)
Calcium: 10.1 mg/dL (ref 8.4–10.5)
Chloride: 102 mEq/L (ref 96–112)
Creatinine, Ser: 1.43 mg/dL — ABNORMAL HIGH (ref 0.40–1.20)
GFR: 35.11 mL/min — AB (ref >60.00)
Glucose, Bld: 144 mg/dL — ABNORMAL HIGH (ref 70–99)
POTASSIUM: 5.1 meq/L (ref 3.5–5.1)
Sodium: 136 mEq/L (ref 135–145)
Total Bilirubin: 0.8 mg/dL (ref 0.2–1.2)
Total Protein: 6.9 g/dL (ref 6.0–8.3)

## 2019-02-27 LAB — LIPID PANEL
CHOL/HDL RATIO: 3
Cholesterol: 170 mg/dL (ref 0–200)
HDL: 61.6 mg/dL (ref >39.00)
LDL Cholesterol: 86 mg/dL (ref 0–99)
NonHDL: 107.95
TRIGLYCERIDES: 108 mg/dL (ref 0.0–149.0)
VLDL: 21.6 mg/dL (ref 0.0–40.0)

## 2019-02-27 NOTE — Assessment & Plan Note (Signed)
Well controlled, no changes to meds. Encouraged heart healthy diet such as the DASH diet and exercise as tolerated.  °

## 2019-02-27 NOTE — Assessment & Plan Note (Signed)
Tolerating statin, encouraged heart healthy diet, avoid trans fats, minimize simple carbs and saturated fats. Increase exercise as tolerated 

## 2019-02-27 NOTE — Patient Instructions (Signed)
DASH Eating Plan  DASH stands for "Dietary Approaches to Stop Hypertension." The DASH eating plan is a healthy eating plan that has been shown to reduce high blood pressure (hypertension). It may also reduce your risk for type 2 diabetes, heart disease, and stroke. The DASH eating plan may also help with weight loss.  What are tips for following this plan?    General guidelines   Avoid eating more than 2,300 mg (milligrams) of salt (sodium) a day. If you have hypertension, you may need to reduce your sodium intake to 1,500 mg a day.   Limit alcohol intake to no more than 1 drink a day for nonpregnant women and 2 drinks a day for men. One drink equals 12 oz of beer, 5 oz of wine, or 1 oz of hard liquor.   Work with your health care provider to maintain a healthy body weight or to lose weight. Ask what an ideal weight is for you.   Get at least 30 minutes of exercise that causes your heart to beat faster (aerobic exercise) most days of the week. Activities may include walking, swimming, or biking.   Work with your health care provider or diet and nutrition specialist (dietitian) to adjust your eating plan to your individual calorie needs.  Reading food labels     Check food labels for the amount of sodium per serving. Choose foods with less than 5 percent of the Daily Value of sodium. Generally, foods with less than 300 mg of sodium per serving fit into this eating plan.   To find whole grains, look for the word "whole" as the first word in the ingredient list.  Shopping   Buy products labeled as "low-sodium" or "no salt added."   Buy fresh foods. Avoid canned foods and premade or frozen meals.  Cooking   Avoid adding salt when cooking. Use salt-free seasonings or herbs instead of table salt or sea salt. Check with your health care provider or pharmacist before using salt substitutes.   Do not fry foods. Cook foods using healthy methods such as baking, boiling, grilling, and broiling instead.   Cook with  heart-healthy oils, such as olive, canola, soybean, or sunflower oil.  Meal planning   Eat a balanced diet that includes:  ? 5 or more servings of fruits and vegetables each day. At each meal, try to fill half of your plate with fruits and vegetables.  ? Up to 6-8 servings of whole grains each day.  ? Less than 6 oz of lean meat, poultry, or fish each day. A 3-oz serving of meat is about the same size as a deck of cards. One egg equals 1 oz.  ? 2 servings of low-fat dairy each day.  ? A serving of nuts, seeds, or beans 5 times each week.  ? Heart-healthy fats. Healthy fats called Omega-3 fatty acids are found in foods such as flaxseeds and coldwater fish, like sardines, salmon, and mackerel.   Limit how much you eat of the following:  ? Canned or prepackaged foods.  ? Food that is high in trans fat, such as fried foods.  ? Food that is high in saturated fat, such as fatty meat.  ? Sweets, desserts, sugary drinks, and other foods with added sugar.  ? Full-fat dairy products.   Do not salt foods before eating.   Try to eat at least 2 vegetarian meals each week.   Eat more home-cooked food and less restaurant, buffet, and fast food.     When eating at a restaurant, ask that your food be prepared with less salt or no salt, if possible.  What foods are recommended?  The items listed may not be a complete list. Talk with your dietitian about what dietary choices are best for you.  Grains  Whole-grain or whole-wheat bread. Whole-grain or whole-wheat pasta. Brown rice. Oatmeal. Quinoa. Bulgur. Whole-grain and low-sodium cereals. Pita bread. Low-fat, low-sodium crackers. Whole-wheat flour tortillas.  Vegetables  Fresh or frozen vegetables (raw, steamed, roasted, or grilled). Low-sodium or reduced-sodium tomato and vegetable juice. Low-sodium or reduced-sodium tomato sauce and tomato paste. Low-sodium or reduced-sodium canned vegetables.  Fruits  All fresh, dried, or frozen fruit. Canned fruit in natural juice (without  added sugar).  Meat and other protein foods  Skinless chicken or turkey. Ground chicken or turkey. Pork with fat trimmed off. Fish and seafood. Egg whites. Dried beans, peas, or lentils. Unsalted nuts, nut butters, and seeds. Unsalted canned beans. Lean cuts of beef with fat trimmed off. Low-sodium, lean deli meat.  Dairy  Low-fat (1%) or fat-free (skim) milk. Fat-free, low-fat, or reduced-fat cheeses. Nonfat, low-sodium ricotta or cottage cheese. Low-fat or nonfat yogurt. Low-fat, low-sodium cheese.  Fats and oils  Soft margarine without trans fats. Vegetable oil. Low-fat, reduced-fat, or light mayonnaise and salad dressings (reduced-sodium). Canola, safflower, olive, soybean, and sunflower oils. Avocado.  Seasoning and other foods  Herbs. Spices. Seasoning mixes without salt. Unsalted popcorn and pretzels. Fat-free sweets.  What foods are not recommended?  The items listed may not be a complete list. Talk with your dietitian about what dietary choices are best for you.  Grains  Baked goods made with fat, such as croissants, muffins, or some breads. Dry pasta or rice meal packs.  Vegetables  Creamed or fried vegetables. Vegetables in a cheese sauce. Regular canned vegetables (not low-sodium or reduced-sodium). Regular canned tomato sauce and paste (not low-sodium or reduced-sodium). Regular tomato and vegetable juice (not low-sodium or reduced-sodium). Pickles. Olives.  Fruits  Canned fruit in a light or heavy syrup. Fried fruit. Fruit in cream or butter sauce.  Meat and other protein foods  Fatty cuts of meat. Ribs. Fried meat. Bacon. Sausage. Bologna and other processed lunch meats. Salami. Fatback. Hotdogs. Bratwurst. Salted nuts and seeds. Canned beans with added salt. Canned or smoked fish. Whole eggs or egg yolks. Chicken or turkey with skin.  Dairy  Whole or 2% milk, cream, and half-and-half. Whole or full-fat cream cheese. Whole-fat or sweetened yogurt. Full-fat cheese. Nondairy creamers. Whipped toppings.  Processed cheese and cheese spreads.  Fats and oils  Butter. Stick margarine. Lard. Shortening. Ghee. Bacon fat. Tropical oils, such as coconut, palm kernel, or palm oil.  Seasoning and other foods  Salted popcorn and pretzels. Onion salt, garlic salt, seasoned salt, table salt, and sea salt. Worcestershire sauce. Tartar sauce. Barbecue sauce. Teriyaki sauce. Soy sauce, including reduced-sodium. Steak sauce. Canned and packaged gravies. Fish sauce. Oyster sauce. Cocktail sauce. Horseradish that you find on the shelf. Ketchup. Mustard. Meat flavorings and tenderizers. Bouillon cubes. Hot sauce and Tabasco sauce. Premade or packaged marinades. Premade or packaged taco seasonings. Relishes. Regular salad dressings.  Where to find more information:   National Heart, Lung, and Blood Institute: www.nhlbi.nih.gov   American Heart Association: www.heart.org  Summary   The DASH eating plan is a healthy eating plan that has been shown to reduce high blood pressure (hypertension). It may also reduce your risk for type 2 diabetes, heart disease, and stroke.   With the   DASH eating plan, you should limit salt (sodium) intake to 2,300 mg a day. If you have hypertension, you may need to reduce your sodium intake to 1,500 mg a day.   When on the DASH eating plan, aim to eat more fresh fruits and vegetables, whole grains, lean proteins, low-fat dairy, and heart-healthy fats.   Work with your health care provider or diet and nutrition specialist (dietitian) to adjust your eating plan to your individual calorie needs.  This information is not intended to replace advice given to you by your health care provider. Make sure you discuss any questions you have with your health care provider.  Document Released: 12/06/2011 Document Revised: 12/10/2016 Document Reviewed: 12/10/2016  [employer] Interactive Patient Education  2019 [employer]

## 2019-02-27 NOTE — Progress Notes (Signed)
Patient ID: Ronda Rajkumar, female    DOB: 24-Oct-1934  Age: 85 y.o. MRN: [id number]    Subjective:  Subjective  HPI Haelyn Forgey presents for f/u bp and chol  She also c/o Hives -- she is unsure where it is coming from .   She is allergic to her cat scratches but she can control it with cortisone cream/ neosporin etc.     Review of Systems  Constitutional: Negative for appetite change, diaphoresis, fatigue and unexpected weight change.  Eyes: Negative for pain, redness and visual disturbance.  Respiratory: Negative for cough, chest tightness, shortness of breath and wheezing.   Cardiovascular: Negative for chest pain, palpitations and leg swelling.  Endocrine: Negative for cold intolerance, heat intolerance, polydipsia, polyphagia and polyuria.  Genitourinary: Negative for difficulty urinating, dysuria and frequency.  Neurological: Negative for dizziness, light-headedness, numbness and headaches.    History Past Medical History:  Diagnosis Date  . Allergy   . Anxiety   . Arthritis   . Arthritis   . Callus   . Depression   . Diabetes mellitus without complication ([place])   . Hyperlipidemia   . Hypertension     She has a past surgical history that includes Cholecystectomy; Abdominal hysterectomy; and Splenectomy.   Her family history includes Brain cancer in her brother; Cancer in her brother, father, mother, and sister; Cancer - Lung in her brother; Diabetes in her brother, brother, father, maternal grandmother, mother, paternal grandmother, and sister.She reports that she has never smoked. She has never used smokeless tobacco. She reports that she does not drink alcohol or use drugs.  Current Outpatient Medications on File Prior to Visit  Medication Sig Dispense Refill  . acetaminophen (TYLENOL ARTHRITIS PAIN) 650 MG CR tablet 2 by mouth once daily as needed    . amLODipine (NORVASC) 5 MG tablet TAKE 1 TABLET DAILY 90 tablet 4  . aspirin 81 MG tablet Take 81 mg by mouth. 3  by mouth in the evening    . atorvastatin (LIPITOR) 20 MG tablet TAKE ONE-HALF (1/2) TABLET ON MONDAY, WEDNESDAY, FRIDAY AND SUNDAY 26 tablet 4  . Bilberry, Vaccinium myrtillus, (BILBERRY PO) Take 470 mg by mouth. 2 by mouth daily    . FLECTOR 1.3 % PTCH APPLY 1 PATCH ONTO THE SKIN TWICE A DAY (Patient taking differently: APPLY 1 PATCH ONTO THE SKIN TWICE A DAY PRN) 30 patch 2  . fluocinonide cream (LIDEX) 3.57 % Apply 1 application topically 2 (two) times daily.    . furosemide (LASIX) 20 MG tablet TAKE 1 TABLET DAILY 90 tablet 4  . glipiZIDE (GLUCOTROL XL) 10 MG 24 hr tablet Take 1 tablet (10 mg total) by mouth 2 (two) times daily. (Patient taking differently: Take 10 mg by mouth daily. TAKE 1 TABLET BY MOUTH ONCE DAILY IN THE MORNING.) 60 tablet 3  . Glucosamine HCl (GLUCOSAMINE PO) Take by mouth daily.    Marland Kitchen glucose blood (ONETOUCH VERIO) test strip Use to test blood sugar twice a day.  DX: E11.21 200 each 3  . HUMALOG KWIKPEN 100 UNIT/ML KwikPen INJECT 5 UNITS AT MEAL TIMES (MAX OF 20 UNITS PER DAY) (Patient taking differently: Inject 7 Units into the skin at bedtime. INJECT 7 UNITS UNDER THE SKIN ONCE DAILY AT NIGHT.) 15 pen 3  . insulin degludec (TRESIBA FLEXTOUCH) 100 UNIT/ML SOPN FlexTouch Pen INJECT 26 UNITS UNDER THE SKIN DAILY 30 mL 1  . Insulin Pen Needle (BD PEN NEEDLE NANO U/F) 32G X 4 MM MISC USE TO  INJECT INSULIN INTO THE SKIN 1-3 TIMES DAILY 100 each 0  . Insulin Pen Needle 29G X 5MM MISC Use to inject insulin into skin 1-3 times daily 100 each 2  . Loratadine (CLARITIN) 10 MG CAPS Take 1 capsule by mouth daily as needed.     . metoprolol tartrate (LOPRESSOR) 100 MG tablet Take 100 mg by mouth 2 (two) times daily. Take 1/2 tablet in the morning and 1tablet at night    . Multiple Vitamins-Minerals (HAIR/SKIN/NAILS) TABS TAKE 1 TABLET DAILY 90 tablet 3  . multivitamin-lutein (OCUVITE-LUTEIN) CAPS capsule Take 1 capsule by mouth daily. 90 capsule 3  . nystatin ointment (MYCOSTATIN)  Apply 1 application topically 2 (two) times daily. 30 g 6  . ONETOUCH DELICA LANCETS 38H MISC Use to test blood sugar once daily- E11.21 100 each 3  . OZEMPIC, 0.25 OR 0.5 MG/DOSE, 2 MG/1.5ML SOPN INJECT 0.5 MGS INTO THE SKIN ONCE A WEEK 1.5 mL 3  . sertraline (ZOLOFT) 100 MG tablet TAKE 1 TABLET DAILY 90 tablet 4   No current facility-administered medications on file prior to visit.      Objective:  Objective  Physical Exam Vitals signs and nursing note reviewed.  Constitutional:      Appearance: She is well-developed.  HENT:     Head: Normocephalic and atraumatic.  Eyes:     Conjunctiva/sclera: Conjunctivae normal.  Neck:     Musculoskeletal: Normal range of motion and neck supple.     Thyroid: No thyromegaly.     Vascular: No carotid bruit or JVD.  Cardiovascular:     Rate and Rhythm: Normal rate and regular rhythm.     Heart sounds: Normal heart sounds. No murmur.  Pulmonary:     Effort: Pulmonary effort is normal. No respiratory distress.     Breath sounds: Normal breath sounds. No wheezing or rales.  Chest:     Chest wall: No tenderness.  Neurological:     Mental Status: She is alert and oriented to person, place, and time.    BP 124/84   Pulse (!) 54   Resp 12   Ht 5\' 4"  (1.626 m)   Wt 282 lb (127.9 kg)   SpO2 98%   BMI 48.41 kg/m  Wt Readings from Last 3 Encounters:  02/27/19 282 lb (127.9 kg)  02/02/19 280 lb 9.6 oz (127.3 kg)  10/02/18 280 lb (127 kg)     Lab Results  Component Value Date   WBC 13.7 (H) 02/27/2019   HGB 15.9 (H) 02/27/2019   HCT 45.6 02/27/2019   PLT 361.0 02/27/2019   GLUCOSE 144 (H) 02/27/2019   CHOL 170 02/27/2019   TRIG 108.0 02/27/2019   HDL 61.60 02/27/2019   LDLDIRECT 78.3 04/16/2013   LDLCALC 86 02/27/2019   ALT 22 02/27/2019   AST 21 02/27/2019   NA 136 02/27/2019   K 5.1 02/27/2019   CL 102 02/27/2019   CREATININE 1.43 (H) 02/27/2019   BUN 35 (H) 02/27/2019   CO2 23 02/27/2019   TSH 0.81 02/27/2019   HGBA1C 7.1  (H) 01/27/2019   MICROALBUR 3.7 (H) 06/26/2018    Dg Bone Density  Result Date: 09/15/2018 EXAM: DUAL X-RAY ABSORPTIOMETRY (DXA) FOR BONE MINERAL DENSITY IMPRESSION: Alferd Apa [place] CHASE Your patient Kristiann Noyce completed a BMD test on 09/15/2018 using the [employer] (analysis version: 16.SP2) manufactured by [employer]. The following summarizes the results of our evaluation. PATIENT: Name: Eleanore, Junio Patient ID: [id number] Birth Date: 09-22-1934 Height:  64.5 in. Gender: Female Measured: 09/15/2018 Weight: 277.6 lbs. Indications: Advanced Age, Caucasian, Diabetic, Estrogen Deficiency, Height Loss, Hysterectomy, Low Calcium Intake, Post Menopausal Fractures: Ankle Treatments: Glipizide, HRT, Lispro, Multivitamin, Semaglutide ASSESSMENT: The BMD measured at Femur Neck Right is 1.204 g/cm2 with a T-score of 1.2. This patient is considered normal according to [facility]) criteria. Lumbar spine was not utilized due to advanced degenerative changes. Site Region Measured Date Measured Age WHO YA BMD Classification T-score DualFemur Total Mean 09/15/2018 81.7 years Normal 2.5 1.323 g/cm2 Left Forearm Radius 33% 09/15/2018 81.7 Normal 1.8 1.031 g/cm2 World Health Organization [facility]) criteria for post-menopausal, Caucasian Women: Normal       T-score at or above -1 SD Osteopenia   T-score between -1 and -2.5 SD Osteoporosis T-score at or below -2.5 SD RECOMMENDATION:1. All patients should optimize calcium and vitamin D intake. 2. Consider FDA-approved medical therapies in postmenopausal women and men aged 68 years and older, based on the following: a. A hip or vertebral(clinical or morphometric) fracture. b. T-Score < -2.5 at the femoral neck or spine after appropriate evaluation to exclude secondary causes c. Low bone mass (T-score between -1.0 and -2.5 at the femoral neck or spine) and a 10 year probability of a hip fracture >3% or a 10 year probability of major  osteoporosis-related fracture > 20% based on the [place]-adapted WHO algorithm d. Clinical judgement and/or patient preferences may indicate treatment for people with 10-year fracture probabilities above or below these levels FOLLOW-UP: Patients with diagnosis of osteoporosis or at high risk for fracture should have regular bone mineral density tests. For patients eligible for Medicare, routine testing is allowed once every 2 years. The testing frequency can be increased to one year for patients who have rapidly progressing disease, those who are receiving or discontinuing medical therapy to restore bone mass, or have additional risk factors. I have reviewed this report and agree with the above findings. [facility] Electronically Signed   By: Lowella Grip III M.D.   On: 09/15/2018 09:26     Assessment & Plan:  Plan  I am having [name] maintain her aspirin, acetaminophen, (Bilberry, Vaccinium myrtillus, (BILBERRY PO)), multivitamin-lutein, FLECTOR, Loratadine, Glucosamine HCl (GLUCOSAMINE PO), fluocinonide cream, ONETOUCH DELICA LANCETS 10X, Insulin Pen Needle, Insulin Pen Needle, nystatin ointment, glucose blood, glipiZIDE, metoprolol tartrate, sertraline, atorvastatin, HAIR/SKIN/NAILS, insulin degludec, HUMALOG KWIKPEN, furosemide, amLODipine, and OZEMPIC (0.25 OR 0.5 MG/DOSE).  No orders of the defined types were placed in this encounter.   Problem List Items Addressed This Visit      Unprioritized   Essential hypertension - Primary    Well controlled, no changes to meds. Encouraged heart healthy diet such as the DASH diet and exercise as tolerated.       Relevant Orders   Lipid panel (Completed)   Comprehensive metabolic panel (Completed)   Hyperlipidemia associated with type 2 diabetes mellitus ([place])    Tolerating statin, encouraged heart healthy diet, avoid trans fats, minimize simple carbs and saturated fats. Increase exercise as tolerated      Relevant Orders    Lipid panel (Completed)   Comprehensive metabolic panel (Completed)    Other Visit Diagnoses    Abnormal WBC count       Relevant Orders   CBC with Differential/Platelet (Completed)   Family history of thyroid disease       Relevant Orders   Thyroid Panel With TSH (Completed)      Follow-up: Return in about 6 months (around 08/28/2019), or  if symptoms worsen or fail to improve, for annual exam, fasting.  Ann Held, DO

## 2019-02-28 LAB — THYROID PANEL WITH TSH
Free Thyroxine Index: 2.5 (ref 1.4–3.8)
T3 Uptake: 32 % (ref 22–35)
T4, Total: 7.7 ug/dL (ref 5.1–11.9)
TSH: 0.81 mIU/L (ref 0.40–4.50)

## 2019-03-20 ENCOUNTER — Other Ambulatory Visit: Payer: Self-pay

## 2019-03-20 ENCOUNTER — Other Ambulatory Visit: Payer: Self-pay | Admitting: *Deleted

## 2019-03-20 DIAGNOSIS — E1121 Type 2 diabetes mellitus with diabetic nephropathy: Secondary | ICD-10-CM

## 2019-03-20 DIAGNOSIS — E1165 Type 2 diabetes mellitus with hyperglycemia: Principal | ICD-10-CM

## 2019-03-20 DIAGNOSIS — E1151 Type 2 diabetes mellitus with diabetic peripheral angiopathy without gangrene: Secondary | ICD-10-CM

## 2019-03-20 DIAGNOSIS — E785 Hyperlipidemia, unspecified: Secondary | ICD-10-CM

## 2019-03-20 DIAGNOSIS — IMO0002 Reserved for concepts with insufficient information to code with codable children: Secondary | ICD-10-CM

## 2019-03-20 MED ORDER — METOPROLOL TARTRATE 100 MG PO TABS: 100.0000 mg | ORAL_TABLET | Freq: Two times a day (BID) | ORAL | 1 refills | Status: DC

## 2019-03-20 MED ORDER — GLIPIZIDE ER 10 MG PO TB24: 10.0000 mg | ORAL_TABLET | Freq: Every day | ORAL | 1 refills | Status: DC

## 2019-03-20 MED ORDER — ATORVASTATIN CALCIUM 20 MG PO TABS: ORAL_TABLET | ORAL | 1 refills | Status: DC

## 2019-03-20 MED ORDER — AMLODIPINE BESYLATE 5 MG PO TABS: 5.0000 mg | ORAL_TABLET | Freq: Every day | ORAL | 1 refills | Status: DC

## 2019-03-25 ENCOUNTER — Encounter: Payer: Self-pay | Admitting: Family Medicine

## 2019-03-27 ENCOUNTER — Encounter: Payer: Self-pay | Admitting: Family Medicine

## 2019-03-27 ENCOUNTER — Other Ambulatory Visit: Payer: Self-pay | Admitting: Family Medicine

## 2019-03-27 DIAGNOSIS — I10 Essential (primary) hypertension: Secondary | ICD-10-CM

## 2019-03-27 MED ORDER — METOPROLOL TARTRATE 100 MG PO TABS: ORAL_TABLET | ORAL | 1 refills | Status: DC

## 2019-03-27 NOTE — Telephone Encounter (Signed)
I changed the directions  Does she need some sent to local pharmacy?

## 2019-03-27 NOTE — Telephone Encounter (Signed)
I changed it 

## 2019-05-04 ENCOUNTER — Other Ambulatory Visit: Payer: Medicare Other

## 2019-05-07 ENCOUNTER — Ambulatory Visit: Payer: Medicare Other | Admitting: Endocrinology

## 2019-06-11 ENCOUNTER — Other Ambulatory Visit: Payer: Self-pay | Admitting: Endocrinology

## 2019-06-22 ENCOUNTER — Other Ambulatory Visit: Payer: Self-pay

## 2019-06-22 ENCOUNTER — Other Ambulatory Visit (INDEPENDENT_AMBULATORY_CARE_PROVIDER_SITE_OTHER): Payer: Medicare Other

## 2019-06-22 DIAGNOSIS — Z794 Long term (current) use of insulin: Secondary | ICD-10-CM | POA: Diagnosis not present

## 2019-06-22 DIAGNOSIS — E1165 Type 2 diabetes mellitus with hyperglycemia: Secondary | ICD-10-CM | POA: Diagnosis not present

## 2019-06-22 LAB — HEMOGLOBIN A1C: Hgb A1c MFr Bld: 7.1 % — ABNORMAL HIGH (ref 4.6–6.5)

## 2019-06-22 LAB — BASIC METABOLIC PANEL
BUN: 27 mg/dL — ABNORMAL HIGH (ref 6–23)
CO2: 25 mEq/L (ref 19–32)
Calcium: 9.6 mg/dL (ref 8.4–10.5)
Chloride: 102 mEq/L (ref 96–112)
Creatinine, Ser: 1.13 mg/dL (ref 0.40–1.20)
GFR: 46.04 mL/min — ABNORMAL LOW (ref >60.00)
Glucose, Bld: 162 mg/dL — ABNORMAL HIGH (ref 70–99)
Potassium: 4.4 mEq/L (ref 3.5–5.1)
Sodium: 135 mEq/L (ref 135–145)

## 2019-06-25 ENCOUNTER — Other Ambulatory Visit: Payer: Self-pay

## 2019-06-25 ENCOUNTER — Ambulatory Visit: Payer: Medicare Other | Admitting: Endocrinology

## 2019-06-25 ENCOUNTER — Encounter: Payer: Self-pay | Admitting: Endocrinology

## 2019-06-25 VITALS — BP 144/76 | HR 64 | Temp 97.8°F | Ht 64.0 in | Wt 290.6 lb

## 2019-06-25 DIAGNOSIS — E1165 Type 2 diabetes mellitus with hyperglycemia: Secondary | ICD-10-CM | POA: Diagnosis not present

## 2019-06-25 DIAGNOSIS — Z794 Long term (current) use of insulin: Secondary | ICD-10-CM | POA: Diagnosis not present

## 2019-06-25 NOTE — Progress Notes (Signed)
Patient ID: Katelyn Mahoney, female   DOB: [birth year], 85 y.o.   MRN: [id number]            Reason for Appointment: Follow-up for Type 2 Diabetes  Referring physician: Etter Sjogren   History of Present Illness:          Date of diagnosis of type 2 diabetes mellitus: ?  1983        Background history:   She had previously been on metformin with fairly good control for some time She was also at some point treated with Janumet Apparently subsequently she could not tolerate metformin and had diarrhea This was switched to glipizide about 2 years ago and she is also being treated with Januvia and Chana Bode was started in 9/17  Review of A1c indicates that her levels had been significantly high since 2016 She was initially seen in consultation on 02/27/17  Recent history:       Non-insulin hypoglycemic drugs the patient is taking are: Ozempic 0.5 mg weekly, glipizide ER 10 mg 1 x a day  INSULIN regimen: Tresiba 26 units at supper, Humalog 8-9 units at supper and 5 units breakfast  A1c is again 7.1, was 6.7 previously  Current management, blood sugar patterns and problems identified:  She did not bring her monitor for download  Although she generally does not check her sugars after meals by history she thinks she has checked some readings after dinner and once after lunch  Does not think her blood sugar has been over 170 postprandially  Also morning readings are in the low 100 range at home  However her lab glucose was 162 fasting but she thinks the morning sugars are higher when she is eating later in the evening the night before  She will sometimes forget her Humalog before breakfast  She will only increase her dose of Humalog 1 unit if she is eating more starchy foods in the evening  No hypoglycemia  He has limited ability to exercise  Her weight has gone up 8 pounds        Side effects from medications have been: Diarrhea from metformin  Compliance with the medical  regimen: Fair  Glucose monitoring:  done 1 +  times a day         Glucometer: One Touch Verio       Blood Glucose readings by recall     PRE-MEAL Fasting Lunch Dinner Bedtime Overall  Glucose range: 108-154   160-170   Mean/median:        POST-MEAL PC Breakfast PC Lunch PC Dinner  Glucose range:  168   Mean/median:       Previous readings:  PRE-MEAL Fasting Lunch Dinner Bedtime Overall  Glucose range:  106-167   188   Mean/median: 13 8      144   POST-MEAL PC Breakfast PC Lunch PC Dinner  Glucose range:  111-155  145   Mean/median:  134         Self-care:  Meal times are:  Breakfast is at 9-10 AM Typical meal intake: Breakfast is frequently cheese toast.  Lunch is variable or nothing.  Meals are usually chicken, salad, fish, sometimes vegetables only.  Snacks: Peanuts, yogurt                Dietician visit, most recent:1/19                Exercise:  None, not able to do much Physical activity  Weight history: Previous  range 249-305  Wt Readings from Last 3 Encounters:  06/25/19 290 lb 9.6 oz (131.8 kg)  02/27/19 282 lb (127.9 kg)  02/02/19 280 lb 9.6 oz (127.3 kg)    Glycemic control:   Lab Results  Component Value Date   HGBA1C 7.1 (H) 06/22/2019   HGBA1C 7.1 (H) 01/27/2019   HGBA1C 6.7 (H) 09/29/2018   Lab Results  Component Value Date   MICROALBUR 3.7 (H) 06/26/2018   LDLCALC 86 02/27/2019   CREATININE 1.13 06/22/2019   Lab Results  Component Value Date   MICRALBCREAT 3.1 06/26/2018   Lab Results  Component Value Date   FRUCTOSAMINE 301 (H) 09/19/2017   FRUCTOSAMINE 312 (H) 06/20/2017   FRUCTOSAMINE 339 (H) 03/27/2017       Allergies as of 06/25/2019      Reactions   Bupropion Hcl    Palmar rash   Penicillins    rash   Citalopram Hydrobromide Other (See Comments)   Unknown   Metformin And Related Diarrhea      Medication List       Accurate as of June 25, 2019  9:19 AM. If you have any questions, ask your nurse or doctor.         STOP taking these medications   nystatin ointment Commonly known as: MYCOSTATIN Stopped by: Elayne Snare, MD     TAKE these medications   amLODipine 5 MG tablet Commonly known as: NORVASC Take 1 tablet (5 mg total) by mouth daily.   aspirin 81 MG tablet Take 81 mg by mouth. 3 by mouth in the evening   atorvastatin 20 MG tablet Commonly known as: LIPITOR TAKE ONE-HALF (1/2) TABLET ON MONDAY, WEDNESDAY, FRIDAY AND SUNDAY   BILBERRY PO Take 470 mg by mouth. 2 by mouth daily   Claritin 10 MG Caps Generic drug: Loratadine Take 1 capsule by mouth daily as needed.   Flector 1.3 % Ptch Generic drug: diclofenac APPLY 1 PATCH ONTO THE SKIN TWICE A DAY   fluocinonide cream 0.05 % Commonly known as: LIDEX Apply 1 application topically 2 (two) times daily.   furosemide 20 MG tablet Commonly known as: LASIX TAKE 1 TABLET DAILY   glipiZIDE 10 MG 24 hr tablet Commonly known as: GLUCOTROL XL Take 1 tablet (10 mg total) by mouth daily. TAKE 1 TABLET BY MOUTH ONCE DAILY IN THE MORNING.   GLUCOSAMINE PO Take by mouth daily.   glucose blood test strip Commonly known as: OneTouch Verio Use to test blood sugar twice a day.  DX: E11.21   HumaLOG KwikPen 100 UNIT/ML KwikPen Generic drug: insulin lispro INJECT 5 UNITS AT MEAL TIMES (MAX OF 20 UNITS PER DAY) What changed: See the new instructions.   insulin degludec 100 UNIT/ML Sopn FlexTouch Pen Commonly known as: Tyler Aas FlexTouch INJECT 26 UNITS UNDER THE SKIN DAILY   Insulin Pen Needle 29G X 5MM Misc Use to inject insulin into skin 1-3 times daily   Insulin Pen Needle 32G X 4 MM Misc Commonly known as: BD Pen Needle Nano U/F USE TO INJECT INSULIN INTO THE SKIN 1-3 TIMES DAILY   metoprolol tartrate 100 MG tablet Commonly known as: LOPRESSOR Take 1/2 tablet in the morning and 1tablet at night   multivitamin-lutein Caps capsule Take 1 capsule by mouth daily.   Hair/Skin/Nails Tabs TAKE 1 TABLET DAILY   OneTouch  Delica Lancets 42P Misc Use to test blood sugar once daily- E11.21   Ozempic (0.25 or 0.5 MG/DOSE) 2 MG/1.5ML Sopn Generic drug: Semaglutide(0.25  or 0.5MG /DOS) INJECT 0.5MG S INTO THE SKIN ONCE A WEEK   sertraline 100 MG tablet Commonly known as: ZOLOFT TAKE 1 TABLET DAILY   Tylenol Arthritis Pain 650 MG CR tablet Generic drug: acetaminophen 2 by mouth once daily as needed       Allergies:  Allergies  Allergen Reactions  . Bupropion Hcl     Palmar rash  . Penicillins     rash  . Citalopram Hydrobromide Other (See Comments)    Unknown  . Metformin And Related Diarrhea    Past Medical History:  Diagnosis Date  . Allergy   . Anxiety   . Arthritis   . Arthritis   . Callus   . Depression   . Diabetes mellitus without complication ([place])   . Hyperlipidemia   . Hypertension     Past Surgical History:  Procedure Laterality Date  . ABDOMINAL HYSTERECTOMY    . CHOLECYSTECTOMY    . SPLENECTOMY      Family History  Problem Relation Age of Onset  . Cancer Mother   . Diabetes Mother   . Cancer Father   . Diabetes Father   . Cancer Sister   . Diabetes Sister   . Cancer Brother   . Brain cancer Brother   . Diabetes Brother   . Diabetes Maternal Grandmother   . Diabetes Paternal Grandmother   . Cancer - Lung Brother   . Diabetes Brother     Social History:  reports that she has never smoked. She has never used smokeless tobacco. She reports that she does not drink alcohol or use drugs.   Review of Systems   Lipid history: LDL controlled with 20 mg Lipitor    Lab Results  Component Value Date   CHOL 170 02/27/2019   HDL 61.60 02/27/2019   LDLCALC 86 02/27/2019   LDLDIRECT 78.3 04/16/2013   TRIG 108.0 02/27/2019   CHOLHDL 3 02/27/2019           Hypertension: Currently on  Metoprolol and amlodipine, was recommended lower dose of metoprolol because of hyperkalemia  Lasix is used only as needed  CKD: Mild and recently stable  This is followed by  nephrologist   HYPERKALEMIA: Potassium is still normal Has been given low potassium diet by nephrologist     Lab Results  Component Value Date   CREATININE 1.13 06/22/2019   CREATININE 1.43 (H) 02/27/2019   CREATININE 1.48 (H) 01/27/2019   CREATININE 1.43 (H) 09/29/2018   Lab Results  Component Value Date   K 4.4 06/22/2019    Most recent foot exam: 03/2018  Eye 9/19  She does have peripheral neuropathy with numbness   LABS:  Lab on 06/22/2019  Component Date Value Ref Range Status  . Sodium 06/22/2019 135  135 - 145 mEq/L Final  . Potassium 06/22/2019 4.4  3.5 - 5.1 mEq/L Final  . Chloride 06/22/2019 102  96 - 112 mEq/L Final  . CO2 06/22/2019 25  19 - 32 mEq/L Final  . Glucose, Bld 06/22/2019 162* 70 - 99 mg/dL Final  . BUN 06/22/2019 27* 6 - 23 mg/dL Final  . Creatinine, Ser 06/22/2019 1.13  0.40 - 1.20 mg/dL Final  . Calcium 06/22/2019 9.6  8.4 - 10.5 mg/dL Final  . GFR 06/22/2019 46.04* >60.00 mL/min Final  . Hgb A1c MFr Bld 06/22/2019 7.1* 4.6 - 6.5 % Final   Glycemic Control Guidelines for People with Diabetes:Non Diabetic:  <6%Goal of Therapy: <7%Additional Action Suggested:  >8%  Physical Examination:  BP (!) 144/76 (BP Location: Left Arm, Patient Position: Sitting, Cuff Size: Large)   Pulse 64   Temp 97.8 F (36.6 C) (Oral)   Ht 5\' 4"  (1.626 m)   Wt 290 lb 9.6 oz (131.8 kg)   LMP  (LMP Unknown)   SpO2 96%   BMI 49.88 kg/m     ASSESSMENT:  Diabetes type 2, with morbid obesity, insulin requiring  See history of present illness for detailed discussion of current diabetes management, blood sugar patterns and problems identified   Her A1c is 7% again  She is on basal bolus insulin regimen and Ozempic  She has not brought her monitor and not clear how often she is having postprandial hyperglycemia Although her lab fasting glucose was 168 this may be because of her not taking her morning insulin with breakfast on that day Otherwise she does  not report any readings over 170 at home whenever she checks them As before her frequency of monitoring after meals is usually inadequate  She cannot explain why her weight has gone up 8 pounds, may be more snacks This is despite her taking Ozempic regularly  HYPERTENSION/mild CKD: Fair control, this is followed by PCP and nephrologist    PLAN:   Discussed timing of glucose monitoring and frequency of testing Discussed blood sugar targets both before and after meals No change in basic insulin regimen on Ozempic at this time She will bring her monitor on the next visit for download To call if she is having any consistently high readings Discussed making more consistent efforts to watch her caloric intake for weight loss    There are no Patient Instructions on file for this visit.   Elayne Snare 06/25/2019, 9:19 AM   Note: This office note was prepared with Dragon voice recognition system technology. Any transcriptional errors that result from this process are unintentional.

## 2019-07-01 ENCOUNTER — Other Ambulatory Visit: Payer: Self-pay

## 2019-07-01 MED ORDER — BD PEN NEEDLE NANO U/F 32G X 4 MM MISC: 3 refills | Status: DC

## 2019-07-02 ENCOUNTER — Other Ambulatory Visit: Payer: Self-pay

## 2019-07-02 MED ORDER — BD PEN NEEDLE NANO U/F 32G X 4 MM MISC: 3 refills | Status: DC

## 2019-07-31 LAB — HM MAMMOGRAPHY

## 2019-08-12 ENCOUNTER — Other Ambulatory Visit: Payer: Self-pay | Admitting: Endocrinology

## 2019-09-03 ENCOUNTER — Ambulatory Visit (INDEPENDENT_AMBULATORY_CARE_PROVIDER_SITE_OTHER): Payer: Medicare Other | Admitting: Family Medicine

## 2019-09-03 ENCOUNTER — Encounter: Payer: Self-pay | Admitting: Family Medicine

## 2019-09-03 ENCOUNTER — Telehealth: Payer: Self-pay

## 2019-09-03 ENCOUNTER — Other Ambulatory Visit: Payer: Self-pay

## 2019-09-03 VITALS — BP 160/88 | HR 82 | Temp 97.5°F | Resp 20 | Ht 64.0 in | Wt 289.2 lb

## 2019-09-03 DIAGNOSIS — R600 Localized edema: Secondary | ICD-10-CM

## 2019-09-03 DIAGNOSIS — E1151 Type 2 diabetes mellitus with diabetic peripheral angiopathy without gangrene: Secondary | ICD-10-CM

## 2019-09-03 DIAGNOSIS — Z0000 Encounter for general adult medical examination without abnormal findings: Secondary | ICD-10-CM | POA: Diagnosis not present

## 2019-09-03 DIAGNOSIS — M5441 Lumbago with sciatica, right side: Secondary | ICD-10-CM | POA: Diagnosis not present

## 2019-09-03 DIAGNOSIS — E785 Hyperlipidemia, unspecified: Secondary | ICD-10-CM | POA: Diagnosis not present

## 2019-09-03 DIAGNOSIS — E1165 Type 2 diabetes mellitus with hyperglycemia: Secondary | ICD-10-CM | POA: Diagnosis not present

## 2019-09-03 DIAGNOSIS — Z23 Encounter for immunization: Secondary | ICD-10-CM | POA: Diagnosis not present

## 2019-09-03 DIAGNOSIS — E1169 Type 2 diabetes mellitus with other specified complication: Secondary | ICD-10-CM | POA: Diagnosis not present

## 2019-09-03 DIAGNOSIS — IMO0002 Reserved for concepts with insufficient information to code with codable children: Secondary | ICD-10-CM

## 2019-09-03 DIAGNOSIS — I10 Essential (primary) hypertension: Secondary | ICD-10-CM

## 2019-09-03 LAB — LIPID PANEL
Cholesterol: 155 mg/dL (ref 0–200)
HDL: 59.1 mg/dL (ref >39.00)
LDL Cholesterol: 73 mg/dL (ref 0–99)
NonHDL: 95.93
Total CHOL/HDL Ratio: 3
Triglycerides: 114 mg/dL (ref 0.0–149.0)
VLDL: 22.8 mg/dL (ref 0.0–40.0)

## 2019-09-03 LAB — COMPREHENSIVE METABOLIC PANEL
ALT: 18 U/L (ref 0–35)
AST: 20 U/L (ref 0–37)
Albumin: 3.8 g/dL (ref 3.5–5.2)
Alkaline Phosphatase: 72 U/L (ref 39–117)
BUN: 37 mg/dL — ABNORMAL HIGH (ref 6–23)
CO2: 24 mEq/L (ref 19–32)
Calcium: 9.7 mg/dL (ref 8.4–10.5)
Chloride: 103 mEq/L (ref 96–112)
Creatinine, Ser: 1.45 mg/dL — ABNORMAL HIGH (ref 0.40–1.20)
GFR: 34.51 mL/min — ABNORMAL LOW (ref >60.00)
Glucose, Bld: 169 mg/dL — ABNORMAL HIGH (ref 70–99)
Potassium: 5.1 mEq/L (ref 3.5–5.1)
Sodium: 137 mEq/L (ref 135–145)
Total Bilirubin: 0.7 mg/dL (ref 0.2–1.2)
Total Protein: 6.9 g/dL (ref 6.0–8.3)

## 2019-09-03 LAB — MICROALBUMIN / CREATININE URINE RATIO
Creatinine,U: 154.4 mg/dL
Microalb Creat Ratio: 2 mg/g (ref 0.0–30.0)
Microalb, Ur: 3 mg/dL — ABNORMAL HIGH (ref 0.0–1.9)

## 2019-09-03 MED ORDER — FUROSEMIDE 40 MG PO TABS: 40.0000 mg | ORAL_TABLET | Freq: Every day | ORAL | 3 refills | Status: DC

## 2019-09-03 MED ORDER — DICLOFENAC EPOLAMINE 1.3 % TD PTCH: MEDICATED_PATCH | TRANSDERMAL | 2 refills | Status: DC

## 2019-09-03 MED ORDER — METOPROLOL TARTRATE 100 MG PO TABS: ORAL_TABLET | ORAL | 1 refills | Status: DC

## 2019-09-03 NOTE — Telephone Encounter (Signed)
PA initiated via Covermymeds; KEY: [id number]. PA approved.   PA Case: [id number], Status: Approved, Coverage Starts on: 06/04/2019 12:00:00 AM, Coverage Ends on: 12/02/2019 12:00:00 AM

## 2019-09-03 NOTE — Assessment & Plan Note (Signed)
Per endo °

## 2019-09-03 NOTE — Progress Notes (Signed)
Subjective:     Katelyn Mahoney is a 85 y.o. female and is here for a comprehensive physical exam. The patient reports no new problems .  Social History   Socioeconomic History  . Marital status: Widowed    Spouse name: Not on file  . Number of children: Not on file  . Years of education: Not on file  . Highest education level: Not on file  Occupational History  . Not on file  Social Needs  . Financial resource strain: Not on file  . Food insecurity    Worry: Not on file    Inability: Not on file  . Transportation needs    Medical: Not on file    Non-medical: Not on file  Tobacco Use  . Smoking status: Never Smoker  . Smokeless tobacco: Never Used  Substance and Sexual Activity  . Alcohol use: No    Alcohol/week: 0.0 standard drinks  . Drug use: No  . Sexual activity: Never    Birth control/protection: Abstinence  Lifestyle  . Physical activity    Days per week: Not on file    Minutes per session: Not on file  . Stress: Not on file  Relationships  . Social Herbalist on phone: Not on file    Gets together: Not on file    Attends religious service: Not on file    Active member of club or organization: Not on file    Attends meetings of clubs or organizations: Not on file    Relationship status: Not on file  . Intimate partner violence    Fear of current or ex partner: Not on file    Emotionally abused: Not on file    Physically abused: Not on file    Forced sexual activity: Not on file  Other Topics Concern  . Not on file  Social History Narrative  . Not on file   Health Maintenance  Topic Date Due  . INFLUENZA VACCINE  08/01/2019  . OPHTHALMOLOGY EXAM  09/09/2019  . HEMOGLOBIN A1C  12/22/2019  . MAMMOGRAM  07/30/2020  . FOOT EXAM  08/26/2020  . TETANUS/TDAP  04/07/2024  . DEXA SCAN  Completed  . PNA vac Low Risk Adult  Completed    The following portions of the patient's history were reviewed and updated as appropriate:  She  has a past  medical history of Allergy, Anxiety, Arthritis, Arthritis, Callus, Depression, Diabetes mellitus without complication ([place]), Hyperlipidemia, and Hypertension. She does not have any pertinent problems on file. She  has a past surgical history that includes Cholecystectomy; Abdominal hysterectomy; and Splenectomy. Her family history includes Brain cancer in her brother; Cancer in her brother, father, mother, and sister; Cancer - Lung in her brother; Diabetes in her brother, brother, father, maternal grandmother, mother, paternal grandmother, and sister. She  reports that she has never smoked. She has never used smokeless tobacco. She reports that she does not drink alcohol or use drugs. She has a current medication list which includes the following prescription(s): acetaminophen, amlodipine, aspirin, atorvastatin, bilberry (vaccinium myrtillus), diclofenac, fluocinonide cream, glipizide, glucosamine hcl, glucose blood, humalog kwikpen, bd pen needle nano u/f, insulin pen needle, loratadine, metoprolol tartrate, hair/skin/nails, multivitamin-lutein, onetouch delica lancets 0000000, ozempic (0.25 or 0.5 mg/dose), sertraline, tresiba flextouch, and furosemide. Current Outpatient Medications on File Prior to Visit  Medication Sig Dispense Refill  . acetaminophen (TYLENOL ARTHRITIS PAIN) 650 MG CR tablet 2 by mouth once daily as needed    . amLODipine (NORVASC)  5 MG tablet Take 1 tablet (5 mg total) by mouth daily. 90 tablet 1  . aspirin 81 MG tablet Take 81 mg by mouth. 3 by mouth in the evening    . atorvastatin (LIPITOR) 20 MG tablet TAKE ONE-HALF (1/2) TABLET ON MONDAY, WEDNESDAY, FRIDAY AND SUNDAY 26 tablet 1  . Bilberry, Vaccinium myrtillus, (BILBERRY PO) Take 470 mg by mouth. 2 by mouth daily    . fluocinonide cream (LIDEX) [id number] % Apply 1 application topically 2 (two) times daily.    Marland Kitchen glipiZIDE (GLUCOTROL XL) 10 MG 24 hr tablet Take 1 tablet (10 mg total) by mouth daily. TAKE 1 TABLET BY MOUTH ONCE DAILY  IN THE MORNING. 90 tablet 1  . Glucosamine HCl (GLUCOSAMINE PO) Take by mouth daily.    Marland Kitchen glucose blood (ONETOUCH VERIO) test strip Use to test blood sugar twice a day.  DX: E11.21 200 each 3  . HUMALOG KWIKPEN 100 UNIT/ML KwikPen INJECT 5 UNITS AT MEAL TIMES (MAX OF 20 UNITS PER DAY) (Patient taking differently: Inject 7 Units into the skin at bedtime. INJECT 7 UNITS UNDER THE SKIN ONCE DAILY AT NIGHT.) 15 pen 3  . Insulin Pen Needle (BD PEN NEEDLE NANO U/F) 32G X 4 MM MISC USE TO INJECT INSULIN INTO THE SKIN 1-3 TIMES DAILY 100 each 3  . Insulin Pen Needle 29G X 5MM MISC Use to inject insulin into skin 1-3 times daily 100 each 2  . Loratadine (CLARITIN) 10 MG CAPS Take 1 capsule by mouth daily as needed.     . Multiple Vitamins-Minerals (HAIR/SKIN/NAILS) TABS TAKE 1 TABLET DAILY 90 tablet 3  . multivitamin-lutein (OCUVITE-LUTEIN) CAPS capsule Take 1 capsule by mouth daily. 90 capsule 3  . ONETOUCH DELICA LANCETS [id number] MISC Use to test blood sugar once daily- E11.21 100 each 3  . OZEMPIC, 0.25 OR 0.5 MG/DOSE, 2 MG/1.5ML SOPN INJECT 0.5MG S INTO THE SKIN ONCE A WEEK 1.5 mL 3  . sertraline (ZOLOFT) 100 MG tablet TAKE 1 TABLET DAILY 90 tablet 4  . TRESIBA FLEXTOUCH 100 UNIT/ML SOPN FlexTouch Pen INJECT 26 UNITS UNDER THE  SKIN DAILY 30 mL 0   No current facility-administered medications on file prior to visit.    She is allergic to bupropion hcl; penicillins; citalopram hydrobromide; and metformin and related..  Review of Systems Review of Systems  Constitutional: Negative for activity change, appetite change and fatigue.  HENT: Negative for hearing loss, congestion, tinnitus and ear discharge.  dentist q52m Eyes: Negative for visual disturbance (see optho q1y -- vision corrected to 20/20 with glasses).  Respiratory: Negative for cough, chest tightness and shortness of breath.   Cardiovascular: Negative for chest pain, palpitations and leg swelling.  Gastrointestinal: Negative for abdominal pain,  diarrhea, constipation and abdominal distention.  Genitourinary: Negative for urgency, frequency, decreased urine volume and difficulty urinating.  Musculoskeletal: Negative for back pain, arthralgias and gait problem.  Skin: Negative for color change, pallor and rash.  Neurological: Negative for dizziness, light-headedness, numbness and headaches.  Hematological: Negative for adenopathy. Does not bruise/bleed easily.  Psychiatric/Behavioral: Negative for suicidal ideas, confusion, sleep disturbance, self-injury, dysphoric mood, decreased concentration and agitation.       Objective:    BP (!) 160/88 (BP Location: Left Arm, Patient Position: Sitting, Cuff Size: Normal)   Pulse 82   Temp (!) 97.5 F (36.4 C) (Temporal)   Resp 20   Ht 5\' 4"  (1.626 m)   Wt 289 lb 3.2 oz (131.2 kg)   LMP  (LMP  Unknown)   SpO2 96%   BMI 49.64 kg/m  General appearance: alert, cooperative, appears stated age and no distress Head: Normocephalic, without obvious abnormality, atraumatic Eyes: conjunctivae/corneas clear. PERRL, EOM's intact. Fundi benign. Ears: normal TM's and external ear canals both ears Nose: Nares normal. Septum midline. Mucosa normal. No drainage or sinus tenderness. Throat: lips, mucosa, and tongue normal; teeth and gums normal Neck: no adenopathy, no carotid bruit, no JVD, supple, symmetrical, trachea midline and thyroid not enlarged, symmetric, no tenderness/mass/nodules Back: symmetric, no curvature. ROM normal. No CVA tenderness. Lungs: clear to auscultation bilaterally Breasts: pt refused  Heart: regular rate and rhythm, S1, S2 normal, no murmur, click, rub or gallop Abdomen: soft, non-tender; bowel sounds normal; no masses,  no organomegaly Pelvic: not indicated; status post hysterectomy, negative ROS Extremities: extremities normal, atraumatic, no cyanosis or edema Pulses: 2+ and symmetric Skin: Skin color, texture, turgor normal. No rashes or lesions Lymph nodes: Cervical,  supraclavicular, and axillary nodes normal. Neurologic: Alert and oriented X 3, normal strength and tone. Normal symmetric reflexes. Normal coordination and gait    Assessment:    Healthy female exam.      Plan:    ghm utd Check labs  See After Visit Summary for Counseling Recommendations    1. Acute right-sided low back pain with right-sided sciatica Stable  - diclofenac (FLECTOR) 1.3 % PTCH; APPLY 1 PATCH ONTO THE SKIN TWICE A DAY  Dispense: 30 patch; Refill: 2  2. Essential hypertension Well controlled, no changes to meds. Encouraged heart healthy diet such as the DASH diet and exercise as tolerated.  - metoprolol tartrate (LOPRESSOR) 100 MG tablet; 1 po bid  Dispense: 180 tablet; Refill: 1 - Comprehensive metabolic panel  3. Hyperlipidemia associated with type 2 diabetes mellitus ([place]) Tolerating statin, encouraged heart healthy diet, avoid trans fats, minimize simple carbs and saturated fats. Increase exercise as tolerated - Lipid panel - Comprehensive metabolic panel  4. Uncontrolled type 2 diabetes mellitus with hyperglycemia ([place]) Per endo  - Microalbumin / creatinine urine ratio  5. Need for influenza vaccination  - Flu Vaccine QUAD High Dose(Fluad)  6. Preventative health care See above   7. Edema of lower extremity Inc lasix Elevate legs  - furosemide (LASIX) 40 MG tablet; Take 1 tablet (40 mg total) by mouth daily.  Dispense: 90 tablet; Refill: 3  8. DM (diabetes mellitus) type II uncontrolled, periph vascular disorder ([place]) Per endo

## 2019-09-03 NOTE — Assessment & Plan Note (Signed)
Inc lasix Elevate legs  Compression sock/ stockings

## 2019-09-03 NOTE — Patient Instructions (Signed)
Preventive Care 83 Years and Older, Female Preventive care refers to lifestyle choices and visits with your health care provider that can promote health and wellness. This includes:  A yearly physical exam. This is also called an annual well check.  Regular dental and eye exams.  Immunizations.  Screening for certain conditions.  Healthy lifestyle choices, such as diet and exercise. What can I expect for my preventive care visit? Physical exam Your health care provider will check:  Height and weight. These may be used to calculate body mass index (BMI), which is a measurement that tells if you are at a healthy weight.  Heart rate and blood pressure.  Your skin for abnormal spots. Counseling Your health care provider may ask you questions about:  Alcohol, tobacco, and drug use.  Emotional well-being.  Home and relationship well-being.  Sexual activity.  Eating habits.  History of falls.  Memory and ability to understand (cognition).  Work and work Statistician.  Pregnancy and menstrual history. What immunizations do I need?  Influenza (flu) vaccine  This is recommended every year. Tetanus, diphtheria, and pertussis (Tdap) vaccine  You may need a Td booster every 10 years. Varicella (chickenpox) vaccine  You may need this vaccine if you have not already been vaccinated. Zoster (shingles) vaccine  You may need this after age 33. Pneumococcal conjugate (PCV13) vaccine  One dose is recommended after age 33. Pneumococcal polysaccharide (PPSV23) vaccine  One dose is recommended after age 72. Measles, mumps, and rubella (MMR) vaccine  You may need at least one dose of MMR if you were born in 1957 or later. You may also need a second dose. Meningococcal conjugate (MenACWY) vaccine  You may need this if you have certain conditions. Hepatitis A vaccine  You may need this if you have certain conditions or if you travel or work in places where you may be exposed  to hepatitis A. Hepatitis B vaccine  You may need this if you have certain conditions or if you travel or work in places where you may be exposed to hepatitis B. Haemophilus influenzae type b (Hib) vaccine  You may need this if you have certain conditions. You may receive vaccines as individual doses or as more than one vaccine together in one shot (combination vaccines). Talk with your health care provider about the risks and benefits of combination vaccines. What tests do I need? Blood tests  Lipid and cholesterol levels. These may be checked every 5 years, or more frequently depending on your overall health.  Hepatitis C test.  Hepatitis B test. Screening  Lung cancer screening. You may have this screening every year starting at age 39 if you have a 30-pack-year history of smoking and currently smoke or have quit within the past 15 years.  Colorectal cancer screening. All adults should have this screening starting at age 36 and continuing until age 15. Your health care provider may recommend screening at age 23 if you are at increased risk. You will have tests every 1-10 years, depending on your results and the type of screening test.  Diabetes screening. This is done by checking your blood sugar (glucose) after you have not eaten for a while (fasting). You may have this done every 1-3 years.  Mammogram. This may be done every 1-2 years. Talk with your health care provider about how often you should have regular mammograms.  BRCA-related cancer screening. This may be done if you have a family history of breast, ovarian, tubal, or peritoneal cancers.  Other tests  Sexually transmitted disease (STD) testing.  Bone density scan. This is done to screen for osteoporosis. You may have this done starting at age 55. Follow these instructions at home: Eating and drinking  Eat a diet that includes fresh fruits and vegetables, whole grains, lean protein, and low-fat dairy products. Limit  your intake of foods with high amounts of sugar, saturated fats, and salt.  Take vitamin and mineral supplements as recommended by your health care provider.  Do not drink alcohol if your health care provider tells you not to drink.  If you drink alcohol: ? Limit how much you have to 0-1 drink a day. ? Be aware of how much alcohol is in your drink. In the [place] one drink equals one 12 oz bottle of beer (355 mL), one 5 oz glass of wine (148 mL), or one 1 oz glass of hard liquor (44 mL). Lifestyle  Take daily care of your teeth and gums.  Stay active. Exercise for at least 30 minutes on 5 or more days each week.  Do not use any products that contain nicotine or tobacco, such as cigarettes, e-cigarettes, and chewing tobacco. If you need help quitting, ask your health care provider.  If you are sexually active, practice safe sex. Use a condom or other form of protection in order to prevent STIs (sexually transmitted infections).  Talk with your health care provider about taking a low-dose aspirin or statin. What's next?  Go to your health care provider once a year for a well check visit.  Ask your health care provider how often you should have your eyes and teeth checked.  Stay up to date on all vaccines. This information is not intended to replace advice given to you by your health care provider. Make sure you discuss any questions you have with your health care provider. Document Released: 01/13/2016 Document Revised: 12/11/2018 Document Reviewed: 12/11/2018 Elsevier Patient Education  2020 [employer].

## 2019-09-03 NOTE — Assessment & Plan Note (Signed)
Encouraged heart healthy diet, increase exercise, avoid trans fats, consider a krill oil cap daily 

## 2019-09-03 NOTE — Assessment & Plan Note (Signed)
Well controlled, no changes to meds. Encouraged heart healthy diet such as the DASH diet and exercise as tolerated.  °

## 2019-09-11 ENCOUNTER — Encounter: Payer: Self-pay | Admitting: Family Medicine

## 2019-09-21 ENCOUNTER — Other Ambulatory Visit: Payer: Self-pay | Admitting: Family Medicine

## 2019-09-21 DIAGNOSIS — I10 Essential (primary) hypertension: Secondary | ICD-10-CM

## 2019-09-29 ENCOUNTER — Encounter: Payer: Self-pay | Admitting: Family Medicine

## 2019-09-30 ENCOUNTER — Other Ambulatory Visit: Payer: Self-pay | Admitting: Family Medicine

## 2019-09-30 ENCOUNTER — Other Ambulatory Visit: Payer: Self-pay

## 2019-09-30 ENCOUNTER — Other Ambulatory Visit: Payer: Self-pay | Admitting: Endocrinology

## 2019-09-30 ENCOUNTER — Encounter: Payer: Self-pay | Admitting: Medical

## 2019-09-30 ENCOUNTER — Ambulatory Visit: Payer: Medicare Other | Admitting: Medical

## 2019-09-30 VITALS — BP 133/63 | HR 73 | Resp 16 | Ht 64.0 in | Wt 287.2 lb

## 2019-09-30 DIAGNOSIS — L089 Local infection of the skin and subcutaneous tissue, unspecified: Secondary | ICD-10-CM

## 2019-09-30 DIAGNOSIS — R21 Rash and other nonspecific skin eruption: Secondary | ICD-10-CM | POA: Diagnosis not present

## 2019-09-30 DIAGNOSIS — IMO0002 Reserved for concepts with insufficient information to code with codable children: Secondary | ICD-10-CM

## 2019-09-30 DIAGNOSIS — E1121 Type 2 diabetes mellitus with diabetic nephropathy: Secondary | ICD-10-CM

## 2019-09-30 DIAGNOSIS — E785 Hyperlipidemia, unspecified: Secondary | ICD-10-CM

## 2019-09-30 DIAGNOSIS — E1151 Type 2 diabetes mellitus with diabetic peripheral angiopathy without gangrene: Secondary | ICD-10-CM

## 2019-09-30 MED ORDER — NYSTATIN 100000 UNIT/GM EX CREA: 1.0000 "application " | TOPICAL_CREAM | Freq: Two times a day (BID) | CUTANEOUS | 0 refills | Status: DC

## 2019-09-30 MED ORDER — DOXYCYCLINE HYCLATE 100 MG PO TABS: 100.0000 mg | ORAL_TABLET | Freq: Two times a day (BID) | ORAL | 0 refills | Status: DC

## 2019-09-30 NOTE — Progress Notes (Signed)
   Subjective:    Patient ID: Katelyn Mahoney, female    DOB: 09/24/1934, 85 y.o.   MRN: [id number]  HPI  Patient is here for right upper leg weeping.   Stated 2 nights ago she woke up wet and noticed her right inner thing was weeping. Prior to that she denies any rash to that area. She reports a generalized rash to her chest, neck, hands which she has been seeing her allergist for and they recommending dermatologist for patch test.   Reports her legs were not affected by the previously stated rash. Denies edema to upper leg, denies itching/pain, unsure of redness or abrasions. States for the first hour she noticed it did feel like she had been scratched by something. She was initially having to change her pants about 3x/day and wrapping a bath towel around her leg during the night which would be "wet through" by morning. The weeping has started to decrease today. Reports drainage has been clear.  For scattered rash pt has seen allergist and they gave hydroxyzine. Pt will be seeing derm as well.  Review of Systems  Constitutional: Negative for chills and fever.  Respiratory: Negative for chest tightness and shortness of breath.   Cardiovascular: Negative for chest pain and palpitations.  Musculoskeletal: Negative for myalgias.  Skin: Positive for rash.       Rash on chest - working up with allergist and dermatology; no rash to legs, right medial/posterior thigh weeping for 2 days       Objective:   Physical Exam  General Mental Status- Alert. General Appearance- Not in acute distress.   Skin Red rash between both breast. Faint rash upper chest.  Neck Carotid Arteries- Normal color. Moisture- Normal Moisture. No carotid bruits. No JVD.  Chest and Lung Exam Auscultation: Breath Sounds:-Normal.  Cardiovascular Auscultation:Rythm- Regular. Murmurs & Other Heart Sounds:Auscultation of the heart reveals- No Murmurs.  Abdomen Inspection:-Inspeection Normal.  Palpation/Percussion:Note:No mass. Palpation and Percussion of the abdomen reveal- Non Tender, Non Distended + BS, no rebound or guarding.   Neurologic Cranial Nerve exam:- CN III-XII intact(No nystagmus), symmetric smile. Strength:- 5/5 equal and symmetric strength both upper and lower extremities.  Rt lower ext- medial distal thigh. Small circular indurated area about 2 cm in diameter. Are is moderate to severe tender. Small pin pt break in epidermis with clear water moist drainage. Calf not swollen. Symmetric compared to left.         Assessment & Plan:  You do have slightly swollen and mild tender area right distal/medial thigh  region.  This area is weeping just a little bit presently but no purulent discharge.  I do think is best to go ahead and treat you with antibiotic and I watch you closely.  If the area worsens or changes please let us know.  Prescription of doxycycline sent to pharmacy.  Rx advisement given.  For the rash between your breast, I do think is a good idea to use nystatin twice daily for probable fungal infection.  You have other areas of rash and I would defer treatment to your allergist and a dermatologist.  Follow-up 7 days or as needed.  Note follow-up could be virtual  Video visit.  Mackie Pai, PA-C

## 2019-09-30 NOTE — Patient Instructions (Addendum)
You do have slightly swollen and mild tender area right distal/ medial thigh  region.  This area is weeping just a little bit presently but no purulent discharge.  I do think is best to go ahead and treat you with antibiotic and I watch you closely.  If the area worsens or changes please let us know.  Prescription of doxycycline sent to pharmacy.  Rx advisement given.  For the rash between your breast, I do think is a good idea to use nystatin twice daily for probable fungal infection.  You have other areas of rash and I would defer treatment to your allergist and a dermatologist.  Follow-up 7 days or as needed.  Note follow-up could be virtual  Video visit.

## 2019-09-30 NOTE — Telephone Encounter (Signed)
She probably needs ov

## 2019-09-30 NOTE — Telephone Encounter (Signed)
Pt has appointment with [facility] this afternoon

## 2019-10-05 ENCOUNTER — Other Ambulatory Visit: Payer: Self-pay | Admitting: Endocrinology

## 2019-10-14 ENCOUNTER — Encounter: Payer: Self-pay | Admitting: Family Medicine

## 2019-10-16 ENCOUNTER — Telehealth: Payer: Self-pay | Admitting: Family Medicine

## 2019-10-16 NOTE — Telephone Encounter (Signed)
Pt dropped off [employer] Member Assessment Form for completion.  Once form is completed per pt, please fax to [facility] [phone].  Form put in provider's box for pick up and completion.

## 2019-10-21 LAB — HM DIABETES EYE EXAM

## 2019-10-22 ENCOUNTER — Other Ambulatory Visit (INDEPENDENT_AMBULATORY_CARE_PROVIDER_SITE_OTHER): Payer: Medicare Other

## 2019-10-22 ENCOUNTER — Other Ambulatory Visit: Payer: Self-pay

## 2019-10-22 DIAGNOSIS — Z794 Long term (current) use of insulin: Secondary | ICD-10-CM | POA: Diagnosis not present

## 2019-10-22 DIAGNOSIS — E1165 Type 2 diabetes mellitus with hyperglycemia: Secondary | ICD-10-CM | POA: Diagnosis not present

## 2019-10-22 LAB — COMPREHENSIVE METABOLIC PANEL
ALT: 22 U/L (ref 0–35)
AST: 25 U/L (ref 0–37)
Albumin: 3.8 g/dL (ref 3.5–5.2)
Alkaline Phosphatase: 67 U/L (ref 39–117)
BUN: 33 mg/dL — ABNORMAL HIGH (ref 6–23)
CO2: 25 mEq/L (ref 19–32)
Calcium: 9.2 mg/dL (ref 8.4–10.5)
Chloride: 102 mEq/L (ref 96–112)
Creatinine, Ser: 1.38 mg/dL — ABNORMAL HIGH (ref 0.40–1.20)
GFR: 36.52 mL/min — ABNORMAL LOW (ref >60.00)
Glucose, Bld: 162 mg/dL — ABNORMAL HIGH (ref 70–99)
Potassium: 4.7 mEq/L (ref 3.5–5.1)
Sodium: 135 mEq/L (ref 135–145)
Total Bilirubin: 0.6 mg/dL (ref 0.2–1.2)
Total Protein: 6.9 g/dL (ref 6.0–8.3)

## 2019-10-22 LAB — HEMOGLOBIN A1C: Hgb A1c MFr Bld: 7.4 % — ABNORMAL HIGH (ref 4.6–6.5)

## 2019-10-22 LAB — MICROALBUMIN / CREATININE URINE RATIO
Creatinine,U: 120.2 mg/dL
Microalb Creat Ratio: 3 mg/g (ref 0.0–30.0)
Microalb, Ur: 3.6 mg/dL — ABNORMAL HIGH (ref 0.0–1.9)

## 2019-10-26 ENCOUNTER — Other Ambulatory Visit: Payer: Self-pay

## 2019-10-26 ENCOUNTER — Ambulatory Visit: Payer: Medicare Other | Admitting: Endocrinology

## 2019-10-26 ENCOUNTER — Encounter: Payer: Self-pay | Admitting: Endocrinology

## 2019-10-26 VITALS — BP 130/70 | HR 65 | Ht 64.0 in | Wt 289.0 lb

## 2019-10-26 DIAGNOSIS — E1165 Type 2 diabetes mellitus with hyperglycemia: Secondary | ICD-10-CM

## 2019-10-26 DIAGNOSIS — Z794 Long term (current) use of insulin: Secondary | ICD-10-CM

## 2019-10-26 LAB — GLUCOSE, POCT (MANUAL RESULT ENTRY): POC Glucose: 161 mg/dl — AB (ref 70–99)

## 2019-10-26 MED ORDER — OZEMPIC (1 MG/DOSE) 2 MG/1.5ML ~~LOC~~ SOPN: 1.0000 mg | PEN_INJECTOR | SUBCUTANEOUS | 1 refills | Status: DC

## 2019-10-26 NOTE — Progress Notes (Signed)
Patient ID: Katelyn Mahoney, female   DOB: 1934/04/12, 85 y.o.   MRN: [id number]            Reason for Appointment: Follow-up for Type 2 Diabetes  Referring physician: Etter Sjogren   History of Present Illness:          Date of diagnosis of type 2 diabetes mellitus: ?  1983        Background history:   She had previously been on metformin with fairly good control for some time She was also at some point treated with Janumet Apparently subsequently she could not tolerate metformin and had diarrhea This was switched to glipizide about 2 years ago and she is also being treated with Januvia and Chana Bode was started in 9/17  Review of A1c indicates that her levels had been significantly high since 2016 She was initially seen in consultation on 02/27/17  Recent history:       Non-insulin hypoglycemic drugs  : Ozempic 0.5 mg weekly, glipizide ER 10 mg 1 x a day  INSULIN regimen: Tresiba 26 units at supper, Humalog 7-9 units at supper and 5 units breakfast  A1c is 7.4, was 7.1  Current management, blood sugar patterns and problems identified:  She did bring her monitor for download  She has checked her blood sugars very sporadically with only 9 readings in the last month  She thinks that occasionally high readings may be related to her forgetting her Humalog in the morning  Although she thinks she is doing well with taking her Tyler Aas every day her lab glucose clozapine 162 fasting  Home readings are variable  She forgets to check readings after dinner  However she is adjusting her Humalog based on what she is eating at dinnertime  Her weight has leveled off  She is very consistent with taking her Ozempic every Saturday  As before cannot exercise        Side effects from medications have been: Diarrhea from metformin  Compliance with the medical regimen: Fair  Glucose monitoring:  done 1 +  times a day         Glucometer: One Touch Verio       Blood Glucose  readings by     PRE-MEAL Fasting Lunch Dinner Bedtime Overall  Glucose range:  128-161  142, 200  139, 154    Mean/median:  141    151   POST-MEAL PC Breakfast PC Lunch PC Dinner  Glucose range:   163   Mean/median:      PREVIOUS readings:  PRE-MEAL Fasting Lunch Dinner Bedtime Overall  Glucose range: 108-154   160-170   Mean/median:        POST-MEAL PC Breakfast PC Lunch PC Dinner  Glucose range:  168   Mean/median:          Self-care:  Meal times are:  Breakfast is at 9-10 AM Typical meal intake: Breakfast is frequently cheese toast.  Lunch is variable or nothing.  Meals are usually chicken, salad, fish, sometimes vegetables only.  Snacks: Peanuts, yogurt                Dietician visit, most recent:1/19                Exercise:  None, not able to do much Physical activity  Weight history: Previous range 249-305  Wt Readings from Last 3 Encounters:  10/26/19 289 lb (131.1 kg)  09/30/19 287 lb 3.2 oz (130.3 kg)  09/03/19 289 lb 3.2  oz (131.2 kg)    Glycemic control:   Lab Results  Component Value Date   HGBA1C 7.4 (H) 10/22/2019   HGBA1C 7.1 (H) 06/22/2019   HGBA1C 7.1 (H) 01/27/2019   Lab Results  Component Value Date   MICROALBUR 3.6 (H) 10/22/2019   LDLCALC 73 09/03/2019   CREATININE 1.38 (H) 10/22/2019   Lab Results  Component Value Date   MICRALBCREAT 3.0 10/22/2019   Lab Results  Component Value Date   FRUCTOSAMINE 301 (H) 09/19/2017   FRUCTOSAMINE 312 (H) 06/20/2017   FRUCTOSAMINE 339 (H) 03/27/2017       Allergies as of 10/26/2019      Reactions   Bupropion Hcl    Palmar rash   Penicillins    rash   Citalopram Hydrobromide Other (See Comments)   Unknown   Metformin And Related Diarrhea      Medication List       Accurate as of October 26, 2019  8:47 AM. If you have any questions, ask your nurse or doctor.        STOP taking these medications   doxycycline 100 MG tablet Commonly known as: VIBRA-TABS Stopped by: Elayne Snare, MD     TAKE these medications   amLODipine 5 MG tablet Commonly known as: NORVASC TAKE 1 TABLET DAILY   aspirin 81 MG tablet Take 81 mg by mouth. 3 by mouth in the evening   atorvastatin 20 MG tablet Commonly known as: LIPITOR TAKE 1/2 TABLET ON MONDAY, WEDNESDAY, FRIDAY, AND     SUNDAY   BILBERRY PO Take 470 mg by mouth. 2 by mouth daily   Claritin 10 MG Caps Generic drug: Loratadine Take 1 capsule by mouth daily as needed.   diclofenac 1.3 % Ptch Commonly known as: Flector APPLY 1 PATCH ONTO THE SKIN TWICE A DAY   fluocinonide cream 0.05 % Commonly known as: LIDEX Apply 1 application topically 2 (two) times daily.   furosemide 40 MG tablet Commonly known as: LASIX Take 1 tablet (40 mg total) by mouth daily.   glipiZIDE 10 MG 24 hr tablet Commonly known as: GLUCOTROL XL TAKE 1 TABLET ONCE DAILY INTHE MORNING   GLUCOSAMINE PO Take by mouth daily.   glucose blood test strip Commonly known as: OneTouch Verio Use to test blood sugar twice a day.  DX: E11.21   HumaLOG KwikPen 100 UNIT/ML KwikPen Generic drug: insulin lispro Inject 7-9 Units into the skin 3 (three) times daily. Inject 7-9 units under the skin three times daily before meals. What changed: Another medication with the same name was removed. Continue taking this medication, and follow the directions you see here. Changed by: Elayne Snare, MD   Insulin Pen Needle 29G X 5MM Misc Use to inject insulin into skin 1-3 times daily   BD Pen Needle Nano U/F 32G X 4 MM Misc Generic drug: Insulin Pen Needle USE TO INJECT INSULIN INTO THE SKIN 1-3 TIMES DAILY   metoprolol tartrate 100 MG tablet Commonly known as: LOPRESSOR 1 po bid   multivitamin-lutein Caps capsule Take 1 capsule by mouth daily. What changed: Another medication with the same name was removed. Continue taking this medication, and follow the directions you see here. Changed by: Elayne Snare, MD   nystatin cream Commonly known as:  MYCOSTATIN Apply 1 application topically 2 (two) times daily.   OneTouch Delica Lancets 99991111 Misc Use to test blood sugar once daily- E11.21   Ozempic (0.25 or 0.5 MG/DOSE) 2 MG/1.5ML Sopn Generic drug:  Semaglutide(0.25 or 0.5MG /DOS) INJECT 0.5MG S INTO THE SKIN ONCE A WEEK   sertraline 100 MG tablet Commonly known as: ZOLOFT TAKE 1 TABLET DAILY   Tresiba FlexTouch 100 UNIT/ML Sopn FlexTouch Pen Generic drug: insulin degludec INJECT 26 UNITS UNDER THE  SKIN DAILY   Tylenol Arthritis Pain 650 MG CR tablet Generic drug: acetaminophen 2 by mouth once daily as needed       Allergies:  Allergies  Allergen Reactions  . Bupropion Hcl     Palmar rash  . Penicillins     rash  . Citalopram Hydrobromide Other (See Comments)    Unknown  . Metformin And Related Diarrhea    Past Medical History:  Diagnosis Date  . Allergy   . Anxiety   . Arthritis   . Arthritis   . Callus   . Depression   . Diabetes mellitus without complication ([place])   . Hyperlipidemia   . Hypertension     Past Surgical History:  Procedure Laterality Date  . ABDOMINAL HYSTERECTOMY    . CHOLECYSTECTOMY    . SPLENECTOMY      Family History  Problem Relation Age of Onset  . Cancer Mother   . Diabetes Mother   . Cancer Father   . Diabetes Father   . Cancer Sister   . Diabetes Sister   . Cancer Brother   . Brain cancer Brother   . Diabetes Brother   . Diabetes Maternal Grandmother   . Diabetes Paternal Grandmother   . Cancer - Lung Brother   . Diabetes Brother     Social History:  reports that she has never smoked. She has never used smokeless tobacco. She reports that she does not drink alcohol or use drugs.   Review of Systems   Lipid history: LDL controlled with 20 mg Lipitor    Lab Results  Component Value Date   CHOL 155 09/03/2019   HDL 59.10 09/03/2019   LDLCALC 73 09/03/2019   LDLDIRECT 78.3 04/16/2013   TRIG 114.0 09/03/2019   CHOLHDL 3 09/03/2019            Hypertension: Controlled on  Metoprolol and amlodipine  Lasix is used only as needed  CKD: Mild and stable  This is followed by nephrologist   HYPERKALEMIA: Potassium is normal Has been given low potassium diet by nephrologist     Lab Results  Component Value Date   CREATININE 1.38 (H) 10/22/2019   CREATININE 1.45 (H) 09/03/2019   CREATININE 1.13 06/22/2019   CREATININE 1.43 (H) 02/27/2019   Lab Results  Component Value Date   K 4.7 10/22/2019    Most recent foot exam: 8/20 with podiatrist  Eye 9/19  She does have peripheral neuropathy with numbness   LABS:  Office Visit on 10/26/2019  Component Date Value Ref Range Status  . POC Glucose 10/26/2019 161* 70 - 99 mg/dl Final  Lab on 10/22/2019  Component Date Value Ref Range Status  . Microalb, Ur 10/22/2019 3.6* 0.0 - 1.9 mg/dL Final  . Creatinine,U 10/22/2019 120.2  mg/dL Final  . Microalb Creat Ratio 10/22/2019 3.0  0.0 - 30.0 mg/g Final  . Sodium 10/22/2019 135  135 - 145 mEq/L Final  . Potassium 10/22/2019 4.7  3.5 - 5.1 mEq/L Final  . Chloride 10/22/2019 102  96 - 112 mEq/L Final  . CO2 10/22/2019 25  19 - 32 mEq/L Final  . Glucose, Bld 10/22/2019 162* 70 - 99 mg/dL Final  . BUN 10/22/2019 33* 6 - 23  mg/dL Final  . Creatinine, Ser 10/22/2019 1.38* 0.40 - 1.20 mg/dL Final  . Total Bilirubin 10/22/2019 0.6  0.2 - 1.2 mg/dL Final  . Alkaline Phosphatase 10/22/2019 67  39 - 117 U/L Final  . AST 10/22/2019 25  0 - 37 U/L Final  . ALT 10/22/2019 22  0 - 35 U/L Final  . Total Protein 10/22/2019 6.9  6.0 - 8.3 g/dL Final  . Albumin 10/22/2019 3.8  3.5 - 5.2 g/dL Final  . Calcium 10/22/2019 9.2  8.4 - 10.5 mg/dL Final  . GFR 10/22/2019 36.52* >60.00 mL/min Final  . Hgb A1c MFr Bld 10/22/2019 7.4* 4.6 - 6.5 % Final   Glycemic Control Guidelines for People with Diabetes:Non Diabetic:  <6%Goal of Therapy: <7%Additional Action Suggested:  >8%     Physical Examination:  BP 130/70 (BP Location: Left Arm, Patient  Position: Sitting, Cuff Size: Large)   Pulse 65   Ht 5\' 4"  (1.626 m)   Wt 289 lb (131.1 kg)   LMP  (LMP Unknown)   SpO2 95%   BMI 49.61 kg/m     ASSESSMENT:  Diabetes type 2, with morbid obesity, insulin requiring  See history of present illness for detailed discussion of current diabetes management, blood sugar patterns and problems identified   Her A1c is 7.4 and slightly higher  She is on basal bolus insulin regimen, glipizide and Ozempic  Considering her age and comorbid conditions her blood sugars are usually well controlled Her main difficulty is not losing weight Although she has sporadic high readings at times she is generally having reasonably good readings at home However not checking any readings after supper Some of her high readings are related to higher carbohydrate intake Not able to exercise or lose weight  HYPERTENSION/mild CKD: Under control, this is followed by PCP and nephrologist   PLAN:   She needs to check sugars after dinner whenever she can remember She will keep a record of her Humalog insulin to make sure she is taking it regularly all the time Trial of 1 mg Ozempic If she has nausea with this she can dial back 5-7 clicks from the 1 mg dose Cut back on carbohydrates especially at breakfast    There are no Patient Instructions on file for this visit.   Elayne Snare 10/26/2019, 8:47 AM   Note: This office note was prepared with Dragon voice recognition system technology. Any transcriptional errors that result from this process are unintentional.

## 2019-10-26 NOTE — Patient Instructions (Signed)
Check blood sugars on waking up 3 days a week ° °Also check blood sugars about 2 hours after meals and do this after different meals by rotation ° °Recommended blood sugar levels on waking up are 90-130 and about 2 hours after meal is 130-180 ° °Please bring your blood sugar monitor to each visit, thank you ° °

## 2019-11-03 ENCOUNTER — Other Ambulatory Visit: Payer: Self-pay | Admitting: *Deleted

## 2019-11-03 MED ORDER — SERTRALINE HCL 100 MG PO TABS: 100.0000 mg | ORAL_TABLET | Freq: Every day | ORAL | 3 refills | Status: DC

## 2019-11-08 ENCOUNTER — Encounter: Payer: Self-pay | Admitting: Family Medicine

## 2019-12-29 ENCOUNTER — Other Ambulatory Visit: Payer: Self-pay

## 2019-12-29 DIAGNOSIS — E1165 Type 2 diabetes mellitus with hyperglycemia: Secondary | ICD-10-CM

## 2019-12-29 MED ORDER — ONETOUCH VERIO VI STRP: ORAL_STRIP | 3 refills | Status: DC

## 2020-01-11 ENCOUNTER — Encounter: Payer: Self-pay | Admitting: Family Medicine

## 2020-01-11 ENCOUNTER — Other Ambulatory Visit: Payer: Self-pay | Admitting: Endocrinology

## 2020-01-25 ENCOUNTER — Other Ambulatory Visit (INDEPENDENT_AMBULATORY_CARE_PROVIDER_SITE_OTHER): Payer: Medicare Other

## 2020-01-25 ENCOUNTER — Other Ambulatory Visit: Payer: Self-pay

## 2020-01-25 DIAGNOSIS — Z794 Long term (current) use of insulin: Secondary | ICD-10-CM | POA: Diagnosis not present

## 2020-01-25 DIAGNOSIS — E1165 Type 2 diabetes mellitus with hyperglycemia: Secondary | ICD-10-CM | POA: Diagnosis not present

## 2020-01-25 LAB — BASIC METABOLIC PANEL
BUN: 33 mg/dL — ABNORMAL HIGH (ref 6–23)
CO2: 23 mEq/L (ref 19–32)
Calcium: 9.3 mg/dL (ref 8.4–10.5)
Chloride: 102 mEq/L (ref 96–112)
Creatinine, Ser: 1.36 mg/dL — ABNORMAL HIGH (ref 0.40–1.20)
GFR: 37.12 mL/min — ABNORMAL LOW (ref >60.00)
Glucose, Bld: 168 mg/dL — ABNORMAL HIGH (ref 70–99)
Potassium: 4.7 mEq/L (ref 3.5–5.1)
Sodium: 134 mEq/L — ABNORMAL LOW (ref 135–145)

## 2020-01-25 LAB — HEMOGLOBIN A1C: Hgb A1c MFr Bld: 6.8 % — ABNORMAL HIGH (ref 4.6–6.5)

## 2020-01-27 ENCOUNTER — Telehealth: Payer: Self-pay

## 2020-01-27 ENCOUNTER — Other Ambulatory Visit: Payer: Self-pay

## 2020-01-27 NOTE — Telephone Encounter (Signed)
Called and left voicemail for pt requesting a call back in order to get blood sugar logs prior to telephone call visit tomorrow.

## 2020-01-28 ENCOUNTER — Ambulatory Visit (INDEPENDENT_AMBULATORY_CARE_PROVIDER_SITE_OTHER): Payer: Medicare Other | Admitting: Endocrinology

## 2020-01-28 ENCOUNTER — Encounter: Payer: Self-pay | Admitting: Endocrinology

## 2020-01-28 DIAGNOSIS — E669 Obesity, unspecified: Secondary | ICD-10-CM

## 2020-01-28 DIAGNOSIS — E1169 Type 2 diabetes mellitus with other specified complication: Secondary | ICD-10-CM | POA: Diagnosis not present

## 2020-01-28 NOTE — Telephone Encounter (Signed)
Called pt again this am and got blood sugar readings. Pt is ready for telephone call visit with Dr. Dwyane Dee at [facility].

## 2020-01-28 NOTE — Progress Notes (Signed)
Patient ID: Katelyn Mahoney, female   DOB: [birth year], 86 y.o.   MRN: [id number]            Reason for Appointment: Follow-up for Type 2 Diabetes  Referring physician: Etter Sjogren  Today's office visit was provided via telemedicine using a telephone call to the patient Patient has been explained the limitations of evaluation and management by telemedicine and the availability of in person appointments.  The patient understood the limitations and agreed to proceed. Patient also understood that the telehealth visit is billable. . Location of the patient: Home . Location of the provider: Office Only the patient and myself were participating in the encounter   History of Present Illness:          Date of diagnosis of type 2 diabetes mellitus: ?  1983        Background history:   She had previously been on metformin with fairly good control for some time She was also at some point treated with Janumet Apparently subsequently she could not tolerate metformin and had diarrhea This was switched to glipizide about 2 years ago and she is also being treated with Januvia and Chana Bode was started in 9/17  Review of A1c indicates that her levels had been significantly high since 2016 She was initially seen in consultation on 02/27/17  Recent history:       Non-insulin hypoglycemic drugs  : Ozempic 1 mg weekly, glipizide ER 10 mg 1 x a day  INSULIN regimen: Tresiba 26 units at supper, Humalog 7-9 units at supper and 5 units breakfast  A1c is much better at 6.8 compared to 7.4   Current management, blood sugar patterns and problems identified:  She did check a few blood sugars after meals recently but they appear to be on high  She thinks that these are higher because of eating sweets and snacks like cookies  With increasing her Ozempic she otherwise thinks that she is not as hungry  However fasting readings are still relatively high and was 168 in the lab  She thinks her weight is  down to about 274, previously had been 289  As before she may occasionally forget to take her Humalog before meals  She is very consistent with taking her Ozempic every Saturday  As before cannot do much walking for exercise        Side effects from medications have been: Diarrhea from metformin  Compliance with the medical regimen: Fair  Glucose monitoring:  done 1 +  times a day         Glucometer: One Touch Verio       Blood Glucose readings by patient review of monitor   PRE-MEAL Fasting Lunch Dinner Bedtime Overall  Glucose range:  139-148   155, 179    Mean/median:     ?   POST-MEAL PC Breakfast PC Lunch PC Dinner  Glucose range:   187-238  185  Mean/median:      Previous readings:   PRE-MEAL Fasting Lunch Dinner Bedtime Overall  Glucose range: 139-148      Mean/median:           Self-care:  Meal times are:  Breakfast is at 9-10 AM Typical meal intake: Breakfast is frequently cheese toast.  Lunch is variable or nothing.  Meals are usually chicken, salad, fish, sometimes vegetables only.  Snacks: Peanuts, yogurt                Dietician visit, most recent:1/19  Weight history: Previous range 249-305  Wt Readings from Last 3 Encounters:  10/26/19 289 lb (131.1 kg)  09/30/19 287 lb 3.2 oz (130.3 kg)  09/03/19 289 lb 3.2 oz (131.2 kg)    Glycemic control:   Lab Results  Component Value Date   HGBA1C 6.8 (H) 01/25/2020   HGBA1C 7.4 (H) 10/22/2019   HGBA1C 7.1 (H) 06/22/2019   Lab Results  Component Value Date   MICROALBUR 3.6 (H) 10/22/2019   LDLCALC 73 09/03/2019   CREATININE 1.36 (H) 01/25/2020   Lab Results  Component Value Date   MICRALBCREAT 3.0 10/22/2019   Lab Results  Component Value Date   FRUCTOSAMINE 301 (H) 09/19/2017   FRUCTOSAMINE 312 (H) 06/20/2017   FRUCTOSAMINE 339 (H) 03/27/2017       Allergies as of 01/28/2020      Reactions   Bupropion Hcl    Palmar rash   Penicillins    rash   Citalopram Hydrobromide Other (See  Comments)   Unknown   Metformin And Related Diarrhea      Medication List       Accurate as of January 28, 2020  9:59 AM. If you have any questions, ask your nurse or doctor.        amLODipine 5 MG tablet Commonly known as: NORVASC TAKE 1 TABLET DAILY   aspirin 81 MG tablet Take 81 mg by mouth. 3 by mouth in the evening   atorvastatin 20 MG tablet Commonly known as: LIPITOR TAKE 1/2 TABLET ON MONDAY, WEDNESDAY, FRIDAY, AND     SUNDAY   BILBERRY PO Take 470 mg by mouth. 2 by mouth daily   Claritin 10 MG Caps Generic drug: Loratadine Take 1 capsule by mouth daily as needed.   diclofenac 1.3 % Ptch Commonly known as: Flector APPLY 1 PATCH ONTO THE SKIN TWICE A DAY   fluocinonide cream 0.05 % Commonly known as: LIDEX Apply 1 application topically 2 (two) times daily.   furosemide 40 MG tablet Commonly known as: LASIX Take 1 tablet (40 mg total) by mouth daily.   glipiZIDE 10 MG 24 hr tablet Commonly known as: GLUCOTROL XL TAKE 1 TABLET ONCE DAILY INTHE MORNING   GLUCOSAMINE PO Take by mouth daily.   HumaLOG KwikPen 100 UNIT/ML KwikPen Generic drug: insulin lispro Inject 7-9 Units into the skin 2 (two) times daily. Inject 7-9 units under the skin three times daily before meals.   Insulin Pen Needle 29G X 5MM Misc Use to inject insulin into skin 1-3 times daily   BD Pen Needle Nano U/F 32G X 4 MM Misc Generic drug: Insulin Pen Needle USE TO INJECT INSULIN INTO THE SKIN 1-3 TIMES DAILY   metoprolol tartrate 100 MG tablet Commonly known as: LOPRESSOR 1 po bid   multivitamin-lutein Caps capsule Take 1 capsule by mouth daily.   nystatin cream Commonly known as: MYCOSTATIN Apply 1 application topically 2 (two) times daily.   OneTouch Delica Lancets 99991111 Misc Use to test blood sugar once daily- E11.21   OneTouch Verio test strip Generic drug: glucose blood Use to test blood sugar twice a day.  DX: E11.21   Ozempic (1 MG/DOSE) 2 MG/1.5ML Sopn Generic  drug: Semaglutide (1 MG/DOSE) Inject 1 mg into the skin once a week.   sertraline 100 MG tablet Commonly known as: ZOLOFT Take 1 tablet (100 mg total) by mouth daily.   Tyler Aas FlexTouch 100 UNIT/ML Sopn FlexTouch Pen Generic drug: insulin degludec INJECT 26 UNITS  SUBCUTANEOUSLY DAILY   Tylenol Arthritis Pain 650 MG CR tablet Generic drug: acetaminophen 2 by mouth once daily as needed       Allergies:  Allergies  Allergen Reactions  . Bupropion Hcl     Palmar rash  . Penicillins     rash  . Citalopram Hydrobromide Other (See Comments)    Unknown  . Metformin And Related Diarrhea    Past Medical History:  Diagnosis Date  . Allergy   . Anxiety   . Arthritis   . Arthritis   . Callus   . Depression   . Diabetes mellitus without complication ([place])   . Hyperlipidemia   . Hypertension     Past Surgical History:  Procedure Laterality Date  . ABDOMINAL HYSTERECTOMY    . CHOLECYSTECTOMY    . SPLENECTOMY      Family History  Problem Relation Age of Onset  . Cancer Mother   . Diabetes Mother   . Cancer Father   . Diabetes Father   . Cancer Sister   . Diabetes Sister   . Cancer Brother   . Brain cancer Brother   . Diabetes Brother   . Diabetes Maternal Grandmother   . Diabetes Paternal Grandmother   . Cancer - Lung Brother   . Diabetes Brother     Social History:  reports that she has never smoked. She has never used smokeless tobacco. She reports that she does not drink alcohol or use drugs.   Review of Systems   Lipid history: LDL controlled with 20 mg Lipitor    Lab Results  Component Value Date   CHOL 155 09/03/2019   HDL 59.10 09/03/2019   LDLCALC 73 09/03/2019   LDLDIRECT 78.3 04/16/2013   TRIG 114.0 09/03/2019   CHOLHDL 3 09/03/2019           Hypertension: Controlled on  Metoprolol and amlodipine He thinks her blood pressure is about [id number] systolic  CKD: Mild and stable  This is followed by nephrologist    HYPERKALEMIA: Potassium is normal recently    Lab Results  Component Value Date   CREATININE 1.36 (H) 01/25/2020   CREATININE 1.38 (H) 10/22/2019   CREATININE 1.45 (H) 09/03/2019   CREATININE 1.13 06/22/2019   Lab Results  Component Value Date   K 4.7 01/25/2020     Eye exam last 10/2019  She does have peripheral neuropathy with numbness  Most recent foot exam: 8/20 with podiatrist  LABS:  Lab on 01/25/2020  Component Date Value Ref Range Status  . Sodium 01/25/2020 134* 135 - 145 mEq/L Final  . Potassium 01/25/2020 4.7  3.5 - 5.1 mEq/L Final  . Chloride 01/25/2020 102  96 - 112 mEq/L Final  . CO2 01/25/2020 23  19 - 32 mEq/L Final  . Glucose, Bld 01/25/2020 168* 70 - 99 mg/dL Final  . BUN 01/25/2020 33* 6 - 23 mg/dL Final  . Creatinine, Ser 01/25/2020 1.36* 0.40 - 1.20 mg/dL Final  . GFR 01/25/2020 37.12* >60.00 mL/min Final  . Calcium 01/25/2020 9.3  8.4 - 10.5 mg/dL Final  . Hgb A1c MFr Bld 01/25/2020 6.8* 4.6 - 6.5 % Final   Glycemic Control Guidelines for People with Diabetes:Non Diabetic:  <6%Goal of Therapy: <7%Additional Action Suggested:  >8%     Physical Examination:  LMP  (LMP Unknown)     ASSESSMENT:  Diabetes type 2, with morbid obesity, insulin requiring  See history of present illness for detailed discussion of current diabetes management, blood sugar  patterns and problems identified   Her A1c is improving at 6.8  She is on basal bolus insulin regimen, glipizide and Ozempic  She likely has done better over the last 3 months with increasing Ozempic to 1 mg which she is tolerating Also reportedly is losing weight Recently has had some high readings related to eating sweets like cookies Also fasting readings appear to be fairly consistently high and as high as 168 in the lab  HYPERTENSION/mild CKD: Under control, this is followed by PCP and nephrologist She does monitor at home but asked her to check blood pressure regularly   PLAN:    Increase Tresiba to 28 units Discussed that we can at least try to get her fasting readings below 130 consistently  She thinks she can do better with her diet and cut back on sweets and high carbohydrate snacks Encouraged her to check readings consistently after meals and call if consistently high Will assess her blood sugars more in detail on next visit if she is able to come in for meter download She does need to take her Humalog consistently with every meal  Recheck A1c in 3 months   Duration of telephone encounter =7 minutes  There are no Patient Instructions on file for this visit.   Elayne Snare 01/28/2020, 9:59 AM   Note: This office note was prepared with Dragon voice recognition system technology. Any transcriptional errors that result from this process are unintentional.

## 2020-02-24 ENCOUNTER — Telehealth: Payer: Self-pay | Admitting: Family Medicine

## 2020-02-24 NOTE — Telephone Encounter (Signed)
Patient received a call from [place] Britt,in regards to [employer]. patient can be reached at [phone]

## 2020-02-26 ENCOUNTER — Encounter: Payer: Self-pay | Admitting: Family Medicine

## 2020-02-29 NOTE — Progress Notes (Signed)
Virtual Visit via Audio Note  I connected with patient on 03/01/20 at  8:45 AM EST by audio enabled telemedicine application and verified that I am speaking with the correct person using two identifiers.   THIS ENCOUNTER IS A VIRTUAL VISIT DUE TO COVID-19 - PATIENT WAS NOT SEEN IN THE OFFICE. PATIENT HAS CONSENTED TO VIRTUAL VISIT / TELEMEDICINE VISIT   Location of patient: home  Location of provider: office  I discussed the limitations of evaluation and management by telemedicine and the availability of in person appointments. The patient expressed understanding and agreed to proceed.   Subjective:   Katelyn Mahoney is a 86 y.o. female who presents for Medicare Annual preventive examination.  Pt enjoys reading and doing puzzles.   Review of Systems:  Home Safety/Smoke Alarms: Feels safe in home. Smoke alarms in place.  Lives in 1 story home. Uses cane and walker on occasion. Grandson lives with her. Walk-in shower w/ grab bar and bench.    Female:    Mammo-07/31/19       Dexa scan-  09/15/18     Eye- pt reports DM eye exam done 09/01/2019. Dr.Shapiro.     Objective:     Vitals: Unable to assess. This visit is enabled though telemedicine due to Covid 19.   Advanced Directives 03/01/2020 08/22/2018 01/10/2018 03/25/2017 05/03/2016 04/02/2016  Does Patient Have a Medical Advance Directive? No Yes No No Yes Yes  Type of Advance Directive - Living will - - Healthcare Power of [place];Living will  Does patient want to make changes to medical advance directive? - No - Patient declined - - - No - Patient declined  Copy of [place] in Chart? - - - - No - copy requested No - copy requested  Would patient like information on creating a medical advance directive? No - Patient declined - No - Patient declined No - Patient declined - -    Tobacco Social History   Tobacco Use  Smoking Status Never Smoker  Smokeless Tobacco Never Used       Counseling given: Not Answered   Clinical Intake:  Pain : No/denies pain    Past Medical History:  Diagnosis Date  . Allergy   . Anxiety   . Arthritis   . Arthritis   . Callus   . Depression   . Diabetes mellitus without complication ([place])   . Hyperlipidemia   . Hypertension    Past Surgical History:  Procedure Laterality Date  . ABDOMINAL HYSTERECTOMY    . CHOLECYSTECTOMY    . SPLENECTOMY     Family History  Problem Relation Age of Onset  . Cancer Mother   . Diabetes Mother   . Cancer Father   . Diabetes Father   . Cancer Sister   . Diabetes Sister   . Cancer Brother   . Brain cancer Brother   . Diabetes Brother   . Diabetes Maternal Grandmother   . Diabetes Paternal Grandmother   . Cancer - Lung Brother   . Diabetes Brother    Social History   Socioeconomic History  . Marital status: Widowed    Spouse name: Not on file  . Number of children: Not on file  . Years of education: Not on file  . Highest education level: Not on file  Occupational History  . Not on file  Tobacco Use  . Smoking status: Never Smoker  . Smokeless tobacco: Never Used  Substance and Sexual Activity  .  Alcohol use: No    Alcohol/week: 0.0 standard drinks  . Drug use: No  . Sexual activity: Never    Birth control/protection: Abstinence  Other Topics Concern  . Not on file  Social History Narrative  . Not on file   Social Determinants of Health   Financial Resource Strain:   . Difficulty of Paying Living Expenses: Not on file  Food Insecurity:   . Worried About Charity fundraiser in the Last Year: Not on file  . Ran Out of Food in the Last Year: Not on file  Transportation Needs:   . Lack of Transportation (Medical): Not on file  . Lack of Transportation (Non-Medical): Not on file  Physical Activity:   . Days of Exercise per Week: Not on file  . Minutes of Exercise per Session: Not on file  Stress:   . Feeling of Stress : Not on file  Social Connections:   .  Frequency of Communication with Friends and Family: Not on file  . Frequency of Social Gatherings with Friends and Family: Not on file  . Attends Religious Services: Not on file  . Active Member of Clubs or Organizations: Not on file  . Attends Archivist Meetings: Not on file  . Marital Status: Not on file    Outpatient Encounter Medications as of 03/01/2020  Medication Sig  . acetaminophen (TYLENOL ARTHRITIS PAIN) 650 MG CR tablet 2 by mouth once daily as needed  . amLODipine (NORVASC) 5 MG tablet TAKE 1 TABLET DAILY  . aspirin 81 MG tablet Take 81 mg by mouth. 3 by mouth in the evening  . atorvastatin (LIPITOR) 20 MG tablet TAKE 1/2 TABLET ON MONDAY, WEDNESDAY, FRIDAY, AND     SUNDAY  . diclofenac (FLECTOR) 1.3 % PTCH APPLY 1 PATCH ONTO THE SKIN TWICE A DAY  . fluocinonide cream (LIDEX) [id number] % Apply 1 application topically 2 (two) times daily.  . furosemide (LASIX) 40 MG tablet Take 1 tablet (40 mg total) by mouth daily.  Marland Kitchen glipiZIDE (GLUCOTROL XL) 10 MG 24 hr tablet TAKE 1 TABLET ONCE DAILY INTHE MORNING  . Glucosamine HCl (GLUCOSAMINE PO) Take by mouth daily.  Marland Kitchen glucose blood (ONETOUCH VERIO) test strip Use to test blood sugar twice a day.  DX: E11.21  . insulin lispro (HUMALOG KWIKPEN) 100 UNIT/ML KwikPen Inject 7-9 Units into the skin 2 (two) times daily. Inject 7-9 units under the skin three times daily before meals.   . Insulin Pen Needle (BD PEN NEEDLE NANO U/F) 32G X 4 MM MISC USE TO INJECT INSULIN INTO THE SKIN 1-3 TIMES DAILY  . Insulin Pen Needle 29G X 5MM MISC Use to inject insulin into skin 1-3 times daily  . Loratadine (CLARITIN) 10 MG CAPS Take 1 capsule by mouth daily as needed.   . metoprolol tartrate (LOPRESSOR) 100 MG tablet 1 po bid  . multivitamin-lutein (OCUVITE-LUTEIN) CAPS capsule Take 1 capsule by mouth daily.  Marland Kitchen nystatin cream (MYCOSTATIN) Apply 1 application topically 2 (two) times daily.  Glory Rosebush DELICA LANCETS 99991111 MISC Use to test blood sugar once  daily- E11.21  . Semaglutide, 1 MG/DOSE, (OZEMPIC, 1 MG/DOSE,) 2 MG/1.5ML SOPN Inject 1 mg into the skin once a week.  . sertraline (ZOLOFT) 100 MG tablet Take 1 tablet (100 mg total) by mouth daily.  . TRESIBA FLEXTOUCH 100 UNIT/ML SOPN FlexTouch Pen INJECT 26 UNITS            SUBCUTANEOUSLY DAILY  . Bilberry, Vaccinium myrtillus, (  BILBERRY PO) Take 470 mg by mouth. 2 by mouth daily   No facility-administered encounter medications on file as of 03/01/2020.    Activities of Daily Living In your present state of health, do you have any difficulty performing the following activities: 03/01/2020 11/02/2019  Hearing? N N  Vision? N N  Difficulty concentrating or making decisions? N Y  Walking or climbing stairs? Y Y  Dressing or bathing? N N  Doing errands, shopping? N N  Preparing Food and eating ? N -  Using the Toilet? N -  In the past six months, have you accidently leaked urine? N -  Do you have problems with loss of bowel control? N -  Managing your Medications? N -  Managing your Finances? N -  Housekeeping or managing your Housekeeping? N -  Some recent data might be hidden    Patient Care Team: Carollee Herter, Alferd Apa, DO as PCP - General (Family Medicine) Rutherford Guys, MD as Consulting Physician (Ophthalmology) Rod Can, MD as Consulting Physician (Orthopedic Surgery) System, Provider Not In as Consulting Physician (Dentistry) Jarome Matin, MD as Consulting Physician (Dermatology) Elayne Snare, MD as Consulting Physician (Endocrinology) Rexene Agent, MD as Attending Physician (Nephrology) Yaakov Guthrie, DPM as Consulting Physician (Podiatry) Yaakov Guthrie, DPM (Podiatry)    Assessment:   This is a routine wellness examination for [place]. Physical assessment deferred to PCP.   Exercise Activities and Dietary recommendations Current Exercise Habits: The patient does not participate in regular exercise at present, Exercise limited by: None identified   Diet (meal  preparation, eat out, water intake, caffeinated beverages, dairy products, fruits and vegetables): 24 hr recall Breakfast: Cheerios and banana Lunch: BLT Dinner:  Grilled chicken and green beans  Snack: sugar free fudge pop  Goals    . Maintain  health and independence       Fall Risk Fall Risk  03/01/2020 01/10/2018 03/25/2017 05/03/2016 04/02/2016  Falls in the past year? 0 No No No No  Number falls in past yr: 0 - - - -  Injury with Fall? 0 - - - -  Risk for fall due to : - - - - -  Risk for fall due to: Comment - - - - -  Follow up Education provided;Falls prevention discussed - - - -   Depression Screen PHQ 2/9 Scores 03/01/2020 01/10/2018 03/25/2017 10/02/2016  PHQ - 2 Score 0 1 1 0     Cognitive Function Ad8 score reviewed for issues:  Issues making decisions:no  Less interest in hobbies / activities:no  Repeats questions, stories (family complaining):no  Trouble using ordinary gadgets (microwave, computer, phone):no  Forgets the month or year: no  Mismanaging finances: no  Remembering appts:no  Daily problems with thinking and/or memory:no Ad8 score is=0         Immunization History  Administered Date(s) Administered  . Fluad Quad(high Dose 65+) 09/03/2019  . Influenza Split 10/03/2011, 09/30/2012  . Influenza Whole 09/24/2008, 09/27/2009, 09/15/2010  . Influenza, High Dose Seasonal PF 10/02/2016, 09/15/2018  . Influenza,inj,Quad PF,6+ Mos 09/24/2013, 09/09/2014, 09/20/2015, 09/23/2017  . Pneumococcal Conjugate-13 11/09/2014  . Pneumococcal Polysaccharide-23 01/17/2012, 08/19/2017  . Td 04/07/2014  . Zoster 03/22/2008    Screening Tests Health Maintenance  Topic Date Due  . HEMOGLOBIN A1C  07/24/2020  . MAMMOGRAM  07/30/2020  . FOOT EXAM  08/26/2020  . OPHTHALMOLOGY EXAM  10/20/2020  . TETANUS/TDAP  04/07/2024  . INFLUENZA VACCINE  Completed  . DEXA SCAN  Completed  .  PNA vac Low Risk Adult  Completed       Plan:   See you next year!  Continue  to eat heart healthy diet (full of fruits, vegetables, whole grains, lean protein, water--limit salt, fat, and sugar intake) and increase physical activity as tolerated.  Continue doing brain stimulating activities (puzzles, reading, adult coloring books, staying active) to keep memory sharp.   Bring a copy of your living will and/or healthcare power of attorney to your next office visit.    I have personally reviewed and noted the following in the patient's chart:   . Medical and social history . Use of alcohol, tobacco or illicit drugs  . Current medications and supplements . Functional ability and status . Nutritional status . Physical activity . Advanced directives . List of other physicians . Hospitalizations, surgeries, and ER visits in previous 12 months . Vitals . Screenings to include cognitive, depression, and falls . Referrals and appointments  In addition, I have reviewed and discussed with patient certain preventive protocols, quality metrics, and best practice recommendations. A written personalized care plan for preventive services as well as general preventive health recommendations were provided to patient.     Naaman Plummer [place], [place]  03/01/2020

## 2020-03-01 ENCOUNTER — Other Ambulatory Visit: Payer: Self-pay

## 2020-03-01 ENCOUNTER — Encounter: Payer: Self-pay | Admitting: *Deleted

## 2020-03-01 ENCOUNTER — Ambulatory Visit (INDEPENDENT_AMBULATORY_CARE_PROVIDER_SITE_OTHER): Payer: Medicare Other | Admitting: *Deleted

## 2020-03-01 DIAGNOSIS — Z0000 Encounter for general adult medical examination without abnormal findings: Secondary | ICD-10-CM

## 2020-03-01 NOTE — Patient Instructions (Signed)
See you next year!  Continue to eat heart healthy diet (full of fruits, vegetables, whole grains, lean protein, water--limit salt, fat, and sugar intake) and increase physical activity as tolerated.  Continue doing brain stimulating activities (puzzles, reading, adult coloring books, staying active) to keep memory sharp.   Bring a copy of your living will and/or healthcare power of attorney to your next office visit.   Katelyn Mahoney , Thank you for taking time to come for your Medicare Wellness Visit. I appreciate your ongoing commitment to your health goals. Please review the following plan we discussed and let me know if I can assist you in the future.   These are the goals we discussed: Goals    . Maintain  health and independence       This is a list of the screening recommended for you and due dates:  Health Maintenance  Topic Date Due  . Hemoglobin A1C  07/24/2020  . Mammogram  07/30/2020  . Complete foot exam   08/26/2020  . Eye exam for diabetics  10/20/2020  . Tetanus Vaccine  04/07/2024  . Flu Shot  Completed  . DEXA scan (bone density measurement)  Completed  . Pneumonia vaccines  Completed    Preventive Care 84 Years and Older, Female Preventive care refers to lifestyle choices and visits with your health care provider that can promote health and wellness. This includes:  A yearly physical exam. This is also called an annual well check.  Regular dental and eye exams.  Immunizations.  Screening for certain conditions.  Healthy lifestyle choices, such as diet and exercise. What can I expect for my preventive care visit? Physical exam Your health care provider will check:  Height and weight. These may be used to calculate body mass index (BMI), which is a measurement that tells if you are at a healthy weight.  Heart rate and blood pressure.  Your skin for abnormal spots. Counseling Your health care provider may ask you questions about:  Alcohol, tobacco,  and drug use.  Emotional well-being.  Home and relationship well-being.  Sexual activity.  Eating habits.  History of falls.  Memory and ability to understand (cognition).  Work and work Statistician.  Pregnancy and menstrual history. What immunizations do I need?  Influenza (flu) vaccine  This is recommended every year. Tetanus, diphtheria, and pertussis (Tdap) vaccine  You may need a Td booster every 10 years. Varicella (chickenpox) vaccine  You may need this vaccine if you have not already been vaccinated. Zoster (shingles) vaccine  You may need this after age 84. Pneumococcal conjugate (PCV13) vaccine  One dose is recommended after age 84. Pneumococcal polysaccharide (PPSV23) vaccine  One dose is recommended after age 84. Measles, mumps, and rubella (MMR) vaccine  You may need at least one dose of MMR if you were born in 1957 or later. You may also need a second dose. Meningococcal conjugate (MenACWY) vaccine  You may need this if you have certain conditions. Hepatitis A vaccine  You may need this if you have certain conditions or if you travel or work in places where you may be exposed to hepatitis A. Hepatitis B vaccine  You may need this if you have certain conditions or if you travel or work in places where you may be exposed to hepatitis B. Haemophilus influenzae type b (Hib) vaccine  You may need this if you have certain conditions. You may receive vaccines as individual doses or as more than one vaccine together in  one shot (combination vaccines). Talk with your health care provider about the risks and benefits of combination vaccines. What tests do I need? Blood tests  Lipid and cholesterol levels. These may be checked every 5 years, or more frequently depending on your overall health.  Hepatitis C test.  Hepatitis B test. Screening  Lung cancer screening. You may have this screening every year starting at age 84 if you have a 30-pack-year  history of smoking and currently smoke or have quit within the past 15 years.  Colorectal cancer screening. All adults should have this screening starting at age 84 and continuing until age 66. Your health care provider may recommend screening at age 76 if you are at increased risk. You will have tests every 1-10 years, depending on your results and the type of screening test.  Diabetes screening. This is done by checking your blood sugar (glucose) after you have not eaten for a while (fasting). You may have this done every 1-3 years.  Mammogram. This may be done every 1-2 years. Talk with your health care provider about how often you should have regular mammograms.  BRCA-related cancer screening. This may be done if you have a family history of breast, ovarian, tubal, or peritoneal cancers. Other tests  Sexually transmitted disease (STD) testing.  Bone density scan. This is done to screen for osteoporosis. You may have this done starting at age 84. Follow these instructions at home: Eating and drinking  Eat a diet that includes fresh fruits and vegetables, whole grains, lean protein, and low-fat dairy products. Limit your intake of foods with high amounts of sugar, saturated fats, and salt.  Take vitamin and mineral supplements as recommended by your health care provider.  Do not drink alcohol if your health care provider tells you not to drink.  If you drink alcohol: ? Limit how much you have to 0-1 drink a day. ? Be aware of how much alcohol is in your drink. In the [place] one drink equals one 12 oz bottle of beer (355 mL), one 5 oz glass of wine (148 mL), or one 1 oz glass of hard liquor (44 mL). Lifestyle  Take daily care of your teeth and gums.  Stay active. Exercise for at least 30 minutes on 5 or more days each week.  Do not use any products that contain nicotine or tobacco, such as cigarettes, e-cigarettes, and chewing tobacco. If you need help quitting, ask your health care  provider.  If you are sexually active, practice safe sex. Use a condom or other form of protection in order to prevent STIs (sexually transmitted infections).  Talk with your health care provider about taking a low-dose aspirin or statin. What's next?  Go to your health care provider once a year for a well check visit.  Ask your health care provider how often you should have your eyes and teeth checked.  Stay up to date on all vaccines. This information is not intended to replace advice given to you by your health care provider. Make sure you discuss any questions you have with your health care provider. Document Revised: 12/11/2018 Document Reviewed: 12/11/2018 Elsevier Patient Education  2020 [employer].

## 2020-03-02 ENCOUNTER — Other Ambulatory Visit: Payer: Self-pay

## 2020-03-03 ENCOUNTER — Encounter: Payer: Self-pay | Admitting: Family Medicine

## 2020-03-03 ENCOUNTER — Ambulatory Visit: Payer: Medicare Other | Admitting: Family Medicine

## 2020-03-03 ENCOUNTER — Other Ambulatory Visit: Payer: Self-pay

## 2020-03-03 VITALS — BP 136/80 | HR 79 | Temp 97.4°F | Resp 18 | Ht 64.0 in | Wt 286.8 lb

## 2020-03-03 DIAGNOSIS — E1165 Type 2 diabetes mellitus with hyperglycemia: Secondary | ICD-10-CM | POA: Diagnosis not present

## 2020-03-03 DIAGNOSIS — E1169 Type 2 diabetes mellitus with other specified complication: Secondary | ICD-10-CM

## 2020-03-03 DIAGNOSIS — I10 Essential (primary) hypertension: Secondary | ICD-10-CM

## 2020-03-03 DIAGNOSIS — F3289 Other specified depressive episodes: Secondary | ICD-10-CM

## 2020-03-03 DIAGNOSIS — F324 Major depressive disorder, single episode, in partial remission: Secondary | ICD-10-CM

## 2020-03-03 DIAGNOSIS — E785 Hyperlipidemia, unspecified: Secondary | ICD-10-CM

## 2020-03-03 LAB — COMPREHENSIVE METABOLIC PANEL
ALT: 20 U/L (ref 0–35)
AST: 21 U/L (ref 0–37)
Albumin: 3.7 g/dL (ref 3.5–5.2)
Alkaline Phosphatase: 65 U/L (ref 39–117)
BUN: 36 mg/dL — ABNORMAL HIGH (ref 6–23)
CO2: 25 mEq/L (ref 19–32)
Calcium: 9.7 mg/dL (ref 8.4–10.5)
Chloride: 104 mEq/L (ref 96–112)
Creatinine, Ser: 1.43 mg/dL — ABNORMAL HIGH (ref 0.40–1.20)
GFR: 35.02 mL/min — ABNORMAL LOW (ref >60.00)
Glucose, Bld: 142 mg/dL — ABNORMAL HIGH (ref 70–99)
Potassium: 4.7 mEq/L (ref 3.5–5.1)
Sodium: 138 mEq/L (ref 135–145)
Total Bilirubin: 0.6 mg/dL (ref 0.2–1.2)
Total Protein: 6.7 g/dL (ref 6.0–8.3)

## 2020-03-03 LAB — LIPID PANEL
Cholesterol: 158 mg/dL (ref 0–200)
HDL: 60.5 mg/dL (ref >39.00)
LDL Cholesterol: 70 mg/dL (ref 0–99)
NonHDL: 97.85
Total CHOL/HDL Ratio: 3
Triglycerides: 141 mg/dL (ref 0.0–149.0)
VLDL: 28.2 mg/dL (ref 0.0–40.0)

## 2020-03-03 MED ORDER — SERTRALINE HCL 100 MG PO TABS: ORAL_TABLET | ORAL | 3 refills | Status: DC

## 2020-03-03 NOTE — Assessment & Plan Note (Signed)
Well controlled, no changes to meds. Encouraged heart healthy diet such as the DASH diet and exercise as tolerated.  °

## 2020-03-03 NOTE — Assessment & Plan Note (Signed)
hgba1c to be checked by endo , minimize simple carbs. Increase exercise as tolerated. Continue current meds

## 2020-03-03 NOTE — Assessment & Plan Note (Signed)
Pt is going to wean off the zoloft to see if the tremor goes away If not she is willing to see neuro

## 2020-03-03 NOTE — Progress Notes (Signed)
Patient ID: Katelyn Mahoney, female    DOB: 1934/10/26  Age: 86 y.o. MRN: [id number]    Subjective:  Subjective  HPI Katelyn Mahoney presents for f/u bp and chol She sees endo for dm She c/o tremor in left hand and wonders if its the zoloft   It has been getting worse   Review of Systems  Constitutional: Negative for activity change, appetite change, fatigue, fever and unexpected weight change.  HENT: Negative for congestion.   Respiratory: Negative for cough and shortness of breath.   Cardiovascular: Negative for chest pain, palpitations and leg swelling.  Gastrointestinal: Negative for vomiting.  Musculoskeletal: Negative for back pain.  Skin: Negative for rash.  Neurological: Negative for headaches.  Psychiatric/Behavioral: Negative for behavioral problems and dysphoric mood. The patient is not nervous/anxious.     History Past Medical History:  Diagnosis Date  . Allergy   . Anxiety   . Arthritis   . Arthritis   . Callus   . Depression   . Diabetes mellitus without complication ([place])   . Hyperlipidemia   . Hypertension     She has a past surgical history that includes Cholecystectomy; Abdominal hysterectomy; and Splenectomy.   Her family history includes Brain cancer in her brother; Cancer in her brother, father, mother, and sister; Cancer - Lung in her brother; Diabetes in her brother, brother, father, maternal grandmother, mother, paternal grandmother, and sister.She reports that she has never smoked. She has never used smokeless tobacco. She reports that she does not drink alcohol or use drugs.  Current Outpatient Medications on File Prior to Visit  Medication Sig Dispense Refill  . acetaminophen (TYLENOL ARTHRITIS PAIN) 650 MG CR tablet 2 by mouth once daily as needed    . amLODipine (NORVASC) 5 MG tablet TAKE 1 TABLET DAILY 90 tablet 1  . aspirin 81 MG tablet Take 81 mg by mouth. 3 by mouth in the evening    . atorvastatin (LIPITOR) 20 MG tablet TAKE 1/2 TABLET  ON MONDAY, WEDNESDAY, FRIDAY, AND     SUNDAY 26 tablet 1  . Bilberry, Vaccinium myrtillus, (BILBERRY PO) Take 470 mg by mouth. 2 by mouth daily    . diclofenac (FLECTOR) 1.3 % PTCH APPLY 1 PATCH ONTO THE SKIN TWICE A DAY 30 patch 2  . fluocinonide cream (LIDEX) [id number] % Apply 1 application topically 2 (two) times daily.    . furosemide (LASIX) 40 MG tablet Take 1 tablet (40 mg total) by mouth daily. 90 tablet 3  . glipiZIDE (GLUCOTROL XL) 10 MG 24 hr tablet TAKE 1 TABLET ONCE DAILY INTHE MORNING 90 tablet 1  . Glucosamine HCl (GLUCOSAMINE PO) Take by mouth daily.    Marland Kitchen glucose blood (ONETOUCH VERIO) test strip Use to test blood sugar twice a day.  DX: E11.21 200 each 3  . insulin lispro (HUMALOG KWIKPEN) 100 UNIT/ML KwikPen Inject 7-9 Units into the skin 2 (two) times daily. Inject 7-9 units under the skin three times daily before meals.     . Insulin Pen Needle (BD PEN NEEDLE NANO U/F) 32G X 4 MM MISC USE TO INJECT INSULIN INTO THE SKIN 1-3 TIMES DAILY 100 each 3  . Insulin Pen Needle 29G X 5MM MISC Use to inject insulin into skin 1-3 times daily 100 each 2  . Loratadine (CLARITIN) 10 MG CAPS Take 1 capsule by mouth daily as needed.     . metoprolol tartrate (LOPRESSOR) 100 MG tablet 1 po bid 180 tablet 1  . multivitamin-lutein (OCUVITE-LUTEIN) CAPS  capsule Take 1 capsule by mouth daily. 90 capsule 3  . nystatin cream (MYCOSTATIN) Apply 1 application topically 2 (two) times daily. 30 g 0  . ONETOUCH DELICA LANCETS 99991111 MISC Use to test blood sugar once daily- E11.21 100 each 3  . Semaglutide, 1 MG/DOSE, (OZEMPIC, 1 MG/DOSE,) 2 MG/1.5ML SOPN Inject 1 mg into the skin once a week. 6 pen 1  . TRESIBA FLEXTOUCH 100 UNIT/ML SOPN FlexTouch Pen INJECT 26 UNITS            SUBCUTANEOUSLY DAILY 30 mL 0   No current facility-administered medications on file prior to visit.     Objective:  Objective  Physical Exam Vitals and nursing note reviewed.  Constitutional:      Appearance: She is well-developed.    HENT:     Head: Normocephalic and atraumatic.  Eyes:     Conjunctiva/sclera: Conjunctivae normal.  Neck:     Thyroid: No thyromegaly.     Vascular: No carotid bruit or JVD.  Cardiovascular:     Rate and Rhythm: Normal rate and regular rhythm.     Heart sounds: Normal heart sounds. No murmur.  Pulmonary:     Effort: Pulmonary effort is normal. No respiratory distress.     Breath sounds: Normal breath sounds. No wheezing or rales.  Chest:     Chest wall: No tenderness.  Musculoskeletal:     Cervical back: Normal range of motion and neck supple.  Neurological:     Mental Status: She is alert and oriented to person, place, and time.     Comments: Mild tremor L hand     BP 136/80 (BP Location: Right Arm, Patient Position: Sitting, Cuff Size: Normal)   Pulse 79   Temp (!) 97.4 F (36.3 C) (Temporal)   Resp 18   Ht 5\' 4"  (1.626 m)   Wt 286 lb 12.8 oz (130.1 kg)   LMP  (LMP Unknown)   SpO2 95%   BMI 49.23 kg/m  Wt Readings from Last 3 Encounters:  03/03/20 286 lb 12.8 oz (130.1 kg)  10/26/19 289 lb (131.1 kg)  09/30/19 287 lb 3.2 oz (130.3 kg)     Lab Results  Component Value Date   WBC 13.7 (H) 02/27/2019   HGB 15.9 (H) 02/27/2019   HCT 45.6 02/27/2019   PLT 361.0 02/27/2019   GLUCOSE 168 (H) 01/25/2020   CHOL 155 09/03/2019   TRIG 114.0 09/03/2019   HDL 59.10 09/03/2019   LDLDIRECT 78.3 04/16/2013   LDLCALC 73 09/03/2019   ALT 22 10/22/2019   AST 25 10/22/2019   NA 134 (L) 01/25/2020   K 4.7 01/25/2020   CL 102 01/25/2020   CREATININE 1.36 (H) 01/25/2020   BUN 33 (H) 01/25/2020   CO2 23 01/25/2020   TSH 0.81 02/27/2019   HGBA1C 6.8 (H) 01/25/2020   MICROALBUR 3.6 (H) 10/22/2019    DG Bone Density  Result Date: 09/15/2018 EXAM: DUAL X-RAY ABSORPTIOMETRY (DXA) FOR BONE MINERAL DENSITY IMPRESSION: Alferd Apa [place] CHASE Your patient Katelyn Mahoney completed a BMD test on 09/15/2018 using the [place] (analysis version: 16.SP2) manufactured by  [employer]. The following summarizes the results of our evaluation. PATIENT: Name: Katelyn Mahoney Patient ID: [id number] Birth Date: [birth year] Height: 64.5 in. Gender: Female Measured: 09/15/2018 Weight: 277.6 lbs. Indications: Advanced Age, Caucasian, Diabetic, Estrogen Deficiency, Height Loss, Hysterectomy, Low Calcium Intake, Post Menopausal Fractures: Ankle Treatments: Glipizide, HRT, Lispro, Multivitamin, Semaglutide ASSESSMENT: The BMD measured at Femur Neck Right is  1.204 g/cm2 with a T-score of 1.2. This patient is considered normal according to [facility]) criteria. Lumbar spine was not utilized due to advanced degenerative changes. Site Region Measured Date Measured Age WHO YA BMD Classification T-score DualFemur Total Mean 09/15/2018 81.7 years Normal 2.5 1.323 g/cm2 Left Forearm Radius 33% 09/15/2018 81.7 Normal 1.8 1.031 g/cm2 World Health Organization [facility]) criteria for post-menopausal, Caucasian Women: Normal       T-score at or above -1 SD Osteopenia   T-score between -1 and -2.5 SD Osteoporosis T-score at or below -2.5 SD RECOMMENDATION:1. All patients should optimize calcium and vitamin D intake. 2. Consider FDA-approved medical therapies in postmenopausal women and men aged 37 years and older, based on the following: a. A hip or vertebral(clinical or morphometric) fracture. b. T-Score < -2.5 at the femoral neck or spine after appropriate evaluation to exclude secondary causes c. Low bone mass (T-score between -1.0 and -2.5 at the femoral neck or spine) and a 10 year probability of a hip fracture >3% or a 10 year probability of major osteoporosis-related fracture > 20% based on the [place]-adapted WHO algorithm d. Clinical judgement and/or patient preferences may indicate treatment for people with 10-year fracture probabilities above or below these levels FOLLOW-UP: Patients with diagnosis of osteoporosis or at high risk for fracture should have regular bone mineral density  tests. For patients eligible for Medicare, routine testing is allowed once every 2 years. The testing frequency can be increased to one year for patients who have rapidly progressing disease, those who are receiving or discontinuing medical therapy to restore bone mass, or have additional risk factors. I have reviewed this report and agree with the above findings. [facility] Electronically Signed   By: Lowella Grip III M.D.   On: 09/15/2018 09:26     Assessment & Plan:  Plan  I have changed Prentiss Lawes's sertraline. I am also having her maintain her aspirin, acetaminophen, (Bilberry, Vaccinium myrtillus, (BILBERRY PO)), multivitamin-lutein, Loratadine, Glucosamine HCl (GLUCOSAMINE PO), fluocinonide cream, OneTouch Delica Lancets 99991111, Insulin Pen Needle, BD Pen Needle Nano U/F, diclofenac, metoprolol tartrate, furosemide, atorvastatin, amLODipine, glipiZIDE, nystatin cream, insulin lispro, Ozempic (1 MG/DOSE), OneTouch Verio, and [employer].  Meds ordered this encounter  Medications  . sertraline (ZOLOFT) 100 MG tablet    Sig: 1/2 tab po qd    Dispense:  90 tablet    Refill:  3    Problem List Items Addressed This Visit      Unprioritized   Depression    Pt is going to wean off the zoloft to see if the tremor goes away If not she is willing to see neuro       Relevant Medications   sertraline (ZOLOFT) 100 MG tablet   Essential hypertension - Primary    Well controlled, no changes to meds. Encouraged heart healthy diet such as the DASH diet and exercise as tolerated.       Relevant Orders   Lipid panel   Comprehensive metabolic panel   Ambulatory referral to Chronic Care Management Services   Hyperlipidemia associated with type 2 diabetes mellitus ([place])    hgba1c to be checked by endo , minimize simple carbs. Increase exercise as tolerated. Continue current meds       Relevant Orders   Lipid panel   Comprehensive metabolic panel   Ambulatory referral  to Chronic Care Management Services    Other Visit Diagnoses    Type 2 diabetes mellitus with hyperglycemia, without long-term current  use of insulin ([place])       Relevant Orders   Ambulatory referral to Chronic Care Management Services   Depression, major, single episode, in partial remission ([place])       Relevant Medications   sertraline (ZOLOFT) 100 MG tablet      Follow-up: Return in about 6 months (around 09/03/2020) for annual exam, fasting.  Ann Held, DO

## 2020-03-03 NOTE — Patient Instructions (Signed)

## 2020-03-06 ENCOUNTER — Other Ambulatory Visit: Payer: Self-pay | Admitting: Endocrinology

## 2020-03-06 ENCOUNTER — Other Ambulatory Visit: Payer: Self-pay | Admitting: Family Medicine

## 2020-03-06 DIAGNOSIS — IMO0002 Reserved for concepts with insufficient information to code with codable children: Secondary | ICD-10-CM

## 2020-03-06 DIAGNOSIS — E785 Hyperlipidemia, unspecified: Secondary | ICD-10-CM

## 2020-03-06 DIAGNOSIS — I10 Essential (primary) hypertension: Secondary | ICD-10-CM

## 2020-03-06 DIAGNOSIS — E1151 Type 2 diabetes mellitus with diabetic peripheral angiopathy without gangrene: Secondary | ICD-10-CM

## 2020-03-06 DIAGNOSIS — E1121 Type 2 diabetes mellitus with diabetic nephropathy: Secondary | ICD-10-CM

## 2020-03-19 ENCOUNTER — Encounter: Payer: Self-pay | Admitting: Family Medicine

## 2020-03-20 ENCOUNTER — Other Ambulatory Visit: Payer: Self-pay | Admitting: Family Medicine

## 2020-03-20 DIAGNOSIS — F324 Major depressive disorder, single episode, in partial remission: Secondary | ICD-10-CM

## 2020-03-20 MED ORDER — SERTRALINE HCL 100 MG PO TABS: ORAL_TABLET | ORAL | 3 refills | Status: DC

## 2020-03-28 ENCOUNTER — Other Ambulatory Visit: Payer: Self-pay | Admitting: Endocrinology

## 2020-04-28 ENCOUNTER — Other Ambulatory Visit: Payer: Self-pay

## 2020-04-28 ENCOUNTER — Other Ambulatory Visit (INDEPENDENT_AMBULATORY_CARE_PROVIDER_SITE_OTHER): Payer: Medicare Other

## 2020-04-28 DIAGNOSIS — E669 Obesity, unspecified: Secondary | ICD-10-CM

## 2020-04-28 DIAGNOSIS — E1169 Type 2 diabetes mellitus with other specified complication: Secondary | ICD-10-CM

## 2020-04-28 LAB — COMPREHENSIVE METABOLIC PANEL
ALT: 23 U/L (ref 0–35)
AST: 24 U/L (ref 0–37)
Albumin: 3.7 g/dL (ref 3.5–5.2)
Alkaline Phosphatase: 62 U/L (ref 39–117)
BUN: 32 mg/dL — ABNORMAL HIGH (ref 6–23)
CO2: 27 mEq/L (ref 19–32)
Calcium: 9.5 mg/dL (ref 8.4–10.5)
Chloride: 101 mEq/L (ref 96–112)
Creatinine, Ser: 1.32 mg/dL — ABNORMAL HIGH (ref 0.40–1.20)
GFR: 38.4 mL/min — ABNORMAL LOW (ref >60.00)
Glucose, Bld: 192 mg/dL — ABNORMAL HIGH (ref 70–99)
Potassium: 4.7 mEq/L (ref 3.5–5.1)
Sodium: 136 mEq/L (ref 135–145)
Total Bilirubin: 0.6 mg/dL (ref 0.2–1.2)
Total Protein: 6.9 g/dL (ref 6.0–8.3)

## 2020-04-28 LAB — HEMOGLOBIN A1C: Hgb A1c MFr Bld: 7.1 % — ABNORMAL HIGH (ref 4.6–6.5)

## 2020-05-04 NOTE — Progress Notes (Signed)
Patient ID: Katelyn Mahoney, female   DOB: 21-Jan-1934, 86 y.o.   MRN: [id number]            Reason for Appointment: Follow-up for Type 2 Diabetes  Referring physician: Etter Sjogren   History of Present Illness:          Date of diagnosis of type 2 diabetes mellitus: ?  1983        Background history:   She had previously been on metformin with fairly good control for some time She was also at some point treated with Janumet Apparently subsequently she could not tolerate metformin and had diarrhea This was switched to glipizide about 2 years ago and she is also being treated with Januvia and Chana Bode was started in 9/17  Review of A1c indicates that her levels had been significantly high since 2016 She was initially seen in consultation on 02/27/17  Recent history:       Non-insulin hypoglycemic drugs  : Ozempic 1 mg weekly, glipizide ER 10 mg 1 x a day  INSULIN regimen: Tresiba 28 units at supper, Humalog 7-9 units at supper and 5 units breakfast  A1c is now 7.1 compared to 6.8   Current management, blood sugar patterns and problems identified:  She has not been remembering to take her insulin doses including sometimes her Tyler Aas  Only recently starting back to take Humalog a little more regularly in the mornings  She thinks she can do better with writing down when she is taking her insulin  Has done only 4 readings on her blood sugars in the last month as below  Last lab work showed fasting glucose of 192 which is likely to be from forgetting the [place] the night before  She thinks that she did not planning her meals well and can do better with healthier diet  Weight is up 10 pounds although some of this may be fluid recently  She is very consistent with taking her Ozempic every Saturday  As before cannot do much walking for exercise        Side effects from medications have been: Diarrhea from metformin  Compliance with the medical regimen: Fair  Glucose  monitoring:  done 1 +  times a day         Glucometer: One Touch Verio       Blood Glucose readings by patient review of monitor   PRE-MEAL Fasting Lunch Dinner Bedtime Overall  Glucose range:  131, 171    125   Mean/median:      172   POST-MEAL PC Breakfast PC Lunch PC Dinner  Glucose range:   246   Mean/median:        PRE-MEAL Fasting Lunch Dinner Bedtime Overall  Glucose range:  139-148   155, 179    Mean/median:     ?   POST-MEAL PC Breakfast PC Lunch PC Dinner  Glucose range:   187-238  185  Mean/median:         Self-care:  Meal times are:  Breakfast is at 9-10 AM Typical meal intake: Breakfast is frequently cheese toast.  Lunch is variable or nothing.  Meals are usually chicken, salad, fish, sometimes vegetables only.  Snacks: Peanuts, yogurt                Dietician visit, most recent:1/19    Weight history: Previous range 249-305  Wt Readings from Last 3 Encounters:  05/05/20 296 lb 6.4 oz (134.4 kg)  03/03/20 286 lb 12.8 oz (  130.1 kg)  10/26/19 289 lb (131.1 kg)    Glycemic control:   Lab Results  Component Value Date   HGBA1C 7.1 (H) 04/28/2020   HGBA1C 6.8 (H) 01/25/2020   HGBA1C 7.4 (H) 10/22/2019   Lab Results  Component Value Date   MICROALBUR 3.6 (H) 10/22/2019   LDLCALC 70 03/03/2020   CREATININE 1.32 (H) 04/28/2020   Lab Results  Component Value Date   MICRALBCREAT 3.0 10/22/2019   Lab Results  Component Value Date   FRUCTOSAMINE 301 (H) 09/19/2017   FRUCTOSAMINE 312 (H) 06/20/2017   FRUCTOSAMINE 339 (H) 03/27/2017       Allergies as of 05/05/2020      Reactions   Bupropion Hcl    Palmar rash   Penicillins    rash   Citalopram Hydrobromide Other (See Comments)   Unknown   Metformin And Related Diarrhea   Blue Dyes (parenteral) Rash   Cobalt Rash   Gold-containing Drug Products Rash   Nickel Rash      Medication List       Accurate as of May 05, 2020  8:43 AM. If you have any questions, ask your nurse or doctor.         amLODipine 5 MG tablet Commonly known as: NORVASC TAKE 1 TABLET DAILY   aspirin 81 MG tablet Take 81 mg by mouth. 3 by mouth in the evening   atorvastatin 20 MG tablet Commonly known as: LIPITOR TAKE 1/2 TABLET ON MONDAY, WEDNESDAY, FRIDAY, AND     SUNDAY   BILBERRY PO Take 470 mg by mouth. 2 by mouth daily   Claritin 10 MG Caps Generic drug: Loratadine Take 1 capsule by mouth daily as needed.   diclofenac 1.3 % Ptch Commonly known as: Flector APPLY 1 PATCH ONTO THE SKIN TWICE A DAY   fluocinonide cream 0.05 % Commonly known as: LIDEX Apply 1 application topically 2 (two) times daily.   furosemide 40 MG tablet Commonly known as: LASIX Take 1 tablet (40 mg total) by mouth daily.   gabapentin 100 MG capsule Commonly known as: NEURONTIN Take 1 capsule (100 mg total) by mouth 3 (three) times daily. Started by: Elayne Snare, MD   glipiZIDE 10 MG 24 hr tablet Commonly known as: GLUCOTROL XL TAKE 1 TABLET EVERY MORNING   GLUCOSAMINE PO Take by mouth daily.   HumaLOG KwikPen 100 UNIT/ML KwikPen Generic drug: insulin lispro Inject 7-9 Units into the skin 2 (two) times daily. Inject 7-9 units under the skin three times daily before meals.   Insulin Pen Needle 29G X 5MM Misc Use to inject insulin into skin 1-3 times daily   BD Pen Needle Nano U/F 32G X 4 MM Misc Generic drug: Insulin Pen Needle USE TO INJECT INSULIN INTO THE SKIN 1-3 TIMES DAILY   metoprolol tartrate 100 MG tablet Commonly known as: LOPRESSOR TAKE 1 TABLET TWICE A DAY   multivitamin-lutein Caps capsule Take 1 capsule by mouth daily.   mupirocin cream 2 % Commonly known as: BACTROBAN Apply 1 application topically 2 (two) times daily. Started by: Elayne Snare, MD   nystatin cream Commonly known as: MYCOSTATIN Apply 1 application topically 2 (two) times daily.   OneTouch Delica Lancets 02R Misc Use to test blood sugar once daily- E11.21   OneTouch Verio test strip Generic drug: glucose blood  Use to test blood sugar twice a day.  DX: E11.21   Ozempic (1 MG/DOSE) 2 MG/1.5ML Sopn Generic drug: Semaglutide (1 MG/DOSE) INJECT 1MG  SUBCUTANEOUSLY  ONCE A WEEK   sertraline 100 MG tablet Commonly known as: ZOLOFT 1/2 tab po qd   Toujeo SoloStar 300 UNIT/ML Solostar Pen Generic drug: insulin glargine (1 Unit Dial) Inject 32 Units into the skin daily. Started by: Elayne Snare, MD   Tyler Aas FlexTouch 100 UNIT/ML FlexTouch Pen Generic drug: insulin degludec Inject 28 units under the skin once daily.   Tylenol Arthritis Pain 650 MG CR tablet Generic drug: acetaminophen 2 by mouth once daily as needed       Allergies:  Allergies  Allergen Reactions  . Bupropion Hcl     Palmar rash  . Penicillins     rash  . Citalopram Hydrobromide Other (See Comments)    Unknown  . Metformin And Related Diarrhea  . Blue Dyes (Parenteral) Rash  . Cobalt Rash  . Gold-Containing Drug Products Rash  . Nickel Rash    Past Medical History:  Diagnosis Date  . Allergy   . Anxiety   . Arthritis   . Arthritis   . Callus   . Depression   . Diabetes mellitus without complication ([place])   . Hyperlipidemia   . Hypertension     Past Surgical History:  Procedure Laterality Date  . ABDOMINAL HYSTERECTOMY    . CHOLECYSTECTOMY    . SPLENECTOMY      Family History  Problem Relation Age of Onset  . Cancer Mother   . Diabetes Mother   . Cancer Father   . Diabetes Father   . Cancer Sister   . Diabetes Sister   . Cancer Brother   . Brain cancer Brother   . Diabetes Brother   . Diabetes Maternal Grandmother   . Diabetes Paternal Grandmother   . Cancer - Lung Brother   . Diabetes Brother     Social History:  reports that she has never smoked. She has never used smokeless tobacco. She reports that she does not drink alcohol or use drugs.   Review of Systems   Lipid history: LDL controlled with 20 mg Lipitor    Lab Results  Component Value Date   CHOL 158 03/03/2020   HDL  60.50 03/03/2020   LDLCALC 70 03/03/2020   LDLDIRECT 78.3 04/16/2013   TRIG 141.0 03/03/2020   CHOLHDL 3 03/03/2020           Hypertension: Controlled on  Metoprolol and amlodipine   CKD: Mild and stable  This is followed by nephrologist   HYPERKALEMIA: Potassium is normal consistently    Lab Results  Component Value Date   CREATININE 1.32 (H) 04/28/2020   CREATININE 1.43 (H) 03/03/2020   CREATININE 1.36 (H) 01/25/2020   CREATININE 1.38 (H) 10/22/2019   Lab Results  Component Value Date   K 4.7 04/28/2020     Eye exam last 10/2019  NEUROPATHY: She does have peripheral neuropathy with numbness  Most recent foot exam 5/21  She is asking about a area on the right fifth toe which is new, she has applied Neosporin  Also having sharp pains in the left toes at times but is using heating pad for this  LABS:  No visits with results within 1 Week(s) from this visit.  Latest known visit with results is:  Lab on 04/28/2020  Component Date Value Ref Range Status  . Sodium 04/28/2020 136  135 - 145 mEq/L Final  . Potassium 04/28/2020 4.7  3.5 - 5.1 mEq/L Final  . Chloride 04/28/2020 101  96 - 112 mEq/L Final  . CO2 04/28/2020  27  19 - 32 mEq/L Final  . Glucose, Bld 04/28/2020 192* 70 - 99 mg/dL Final  . BUN 04/28/2020 32* 6 - 23 mg/dL Final  . Creatinine, Ser 04/28/2020 1.32* 0.40 - 1.20 mg/dL Final  . Total Bilirubin 04/28/2020 0.6  0.2 - 1.2 mg/dL Final  . Alkaline Phosphatase 04/28/2020 62  39 - 117 U/L Final  . AST 04/28/2020 24  0 - 37 U/L Final  . ALT 04/28/2020 23  0 - 35 U/L Final  . Total Protein 04/28/2020 6.9  6.0 - 8.3 g/dL Final  . Albumin 04/28/2020 3.7  3.5 - 5.2 g/dL Final  . GFR 04/28/2020 38.40* >60.00 mL/min Final  . Calcium 04/28/2020 9.5  8.4 - 10.5 mg/dL Final  . Hgb A1c MFr Bld 04/28/2020 7.1* 4.6 - 6.5 % Final   Glycemic Control Guidelines for People with Diabetes:Non Diabetic:  <6%Goal of Therapy: <7%Additional Action Suggested:  >8%      Physical Examination:  BP 140/82 (BP Location: Left Arm, Patient Position: Sitting, Cuff Size: Large)   Pulse 82   Ht 5\' 4"  (1.626 m)   Wt 296 lb 6.4 oz (134.4 kg)   LMP  (LMP Unknown)   SpO2 96%   BMI 50.88 kg/m   Diabetic Foot Exam - Simple   Simple Foot Form Diabetic Foot exam was performed with the following findings: Yes   Visual Inspection See comments: Yes Sensation Testing See comments: Yes Pulse Check See comments: Yes Comments Mild hammertoes bilaterally. Superficial 1 cm ulcer dorsum of right fifth toe with mild erythema, no exudate Absent monofilament sensation on the right 2 toes dorsally and also absent on the distal right plantar surfaces. Pedal pulses not palpable      ASSESSMENT:  Diabetes type 2, with morbid obesity, insulin requiring  See history of present illness for detailed discussion of current diabetes management, blood sugar patterns and problems identified   Her A1c is relatively higher at 7.1  She is on basal bolus insulin regimen, glipizide and Ozempic  As discussed above her regimen of checking her sugars, taking insulin and following her diet has been relatively poor Fasting readings higher Likely from forgetting the [place] the night before Also difficult to assess her blood sugar patterns because of lack of glucose monitoring except on 3 or 4 occasions this last month Weight has increased also  HYPERTENSION/mild CKD: Creatinine stable Blood pressures very well controlled  NEUROPATHY: She has pains in the left toes and finding of decreased to absent sensation on the right toes  Superficial ulcer/abrasion on the right fifth toe without significant infection   PLAN:   Since Tyler Aas is apparently not covered by the Medicare plan we will give her a try on Toujeo to see if it is covered Likely will need to go up 4 units and take 32 units instead of 28 on this insulin She will adjust this based on her fasting readings if consistently  high She will keep a record of her insulin doses to improve her compliance including both her Humalog and the Toujeo or [place] She will try to plan her meals better and reduce carbohydrates, snacks and high-fat foods No change in Ozempic She will try to check sugars more consistently by rotation at different times as directed  Trial of gabapentin 100 mg twice daily for neuropathic symptoms Bactroban topically for the superficial ulcer   Follow-up in 3 months  Patient Instructions  Toujeo 32 units at supper when out of Tresiba  May  go up /down 2 U if outside am range of 90-130 for 3 days  Check blood sugars on waking up days a week  Also check blood sugars about 2 hours after meals and do this after different meals by rotation  Recommended blood sugar levels on waking up are 90-130 and about 2 hours after meal is 130-160  Please bring your blood sugar monitor to each visit, thank you      Elayne Snare 05/05/2020, 8:43 AM   Note: This office note was prepared with Dragon voice recognition system technology. Any transcriptional errors that result from this process are unintentional.

## 2020-05-05 ENCOUNTER — Other Ambulatory Visit: Payer: Self-pay

## 2020-05-05 ENCOUNTER — Ambulatory Visit: Payer: Medicare Other | Admitting: Endocrinology

## 2020-05-05 ENCOUNTER — Encounter: Payer: Self-pay | Admitting: Endocrinology

## 2020-05-05 VITALS — BP 140/82 | HR 82 | Ht 64.0 in | Wt 296.4 lb

## 2020-05-05 DIAGNOSIS — E1165 Type 2 diabetes mellitus with hyperglycemia: Secondary | ICD-10-CM

## 2020-05-05 DIAGNOSIS — E1142 Type 2 diabetes mellitus with diabetic polyneuropathy: Secondary | ICD-10-CM

## 2020-05-05 DIAGNOSIS — Z794 Long term (current) use of insulin: Secondary | ICD-10-CM | POA: Diagnosis not present

## 2020-05-05 MED ORDER — TOUJEO SOLOSTAR 300 UNIT/ML ~~LOC~~ SOPN
32.0000 [IU] | PEN_INJECTOR | Freq: Every day | SUBCUTANEOUS | 3 refills | Status: DC
Start: 2020-05-05 — End: 2020-07-18

## 2020-05-05 MED ORDER — MUPIROCIN CALCIUM 2 % EX CREA: 1.0000 "application " | TOPICAL_CREAM | Freq: Two times a day (BID) | CUTANEOUS | 0 refills | Status: DC

## 2020-05-05 MED ORDER — GABAPENTIN 100 MG PO CAPS
100.0000 mg | ORAL_CAPSULE | Freq: Three times a day (TID) | ORAL | 3 refills | Status: DC
Start: 2020-05-05 — End: 2020-09-22

## 2020-05-05 NOTE — Patient Instructions (Addendum)
Toujeo 32 units at supper when out of Tresiba  May go up /down 2 U if outside am range of 90-130 for 3 days  Check blood sugars on waking up days a week  Also check blood sugars about 2 hours after meals and do this after different meals by rotation  Recommended blood sugar levels on waking up are 90-130 and about 2 hours after meal is 130-160  Please bring your blood sugar monitor to each visit, thank you

## 2020-05-11 ENCOUNTER — Telehealth: Payer: Self-pay | Admitting: Family Medicine

## 2020-05-11 NOTE — Progress Notes (Signed)
°  Chronic Care Management   Outreach Note  05/11/2020 Name: Katelyn Mahoney MRN: [id number] DOB: [birth year]  Referred by: Ann Held, DO Reason for referral : No chief complaint on file.   An unsuccessful telephone outreach was attempted today. The patient was referred to the pharmacist for assistance with care management and care coordination.   This note is not being shared with the patient for the following reason: To respect privacy (The patient or proxy has requested that the information not be shared).  Follow Up Plan:   Earney [place] Upstream Scheduler

## 2020-05-18 ENCOUNTER — Telehealth: Payer: Self-pay | Admitting: Family Medicine

## 2020-05-18 NOTE — Progress Notes (Signed)
  Chronic Care Management   Outreach Note  05/18/2020 Name: Katelyn Mahoney MRN: [id number] DOB: 1934-05-04  Referred by: Ann Held, DO Reason for referral : No chief complaint on file.   An unsuccessful telephone outreach was attempted today. The patient was referred to the pharmacist for assistance with care management and care coordination.   This note is not being shared with the patient for the following reason: To respect privacy (The patient or proxy has requested that the information not be shared).  Follow Up Plan:   Earney [facility] Scheduler

## 2020-05-24 ENCOUNTER — Other Ambulatory Visit: Payer: Self-pay | Admitting: *Deleted

## 2020-05-24 MED ORDER — BD PEN NEEDLE NANO U/F 32G X 4 MM MISC: 4 refills | Status: DC

## 2020-05-25 ENCOUNTER — Other Ambulatory Visit: Payer: Self-pay

## 2020-05-25 MED ORDER — BD PEN NEEDLE NANO U/F 32G X 4 MM MISC: 4 refills | Status: DC

## 2020-05-26 ENCOUNTER — Telehealth: Payer: Self-pay | Admitting: Family Medicine

## 2020-05-26 NOTE — Progress Notes (Signed)
  Chronic Care Management   Outreach Note  05/26/2020 Name: Katelyn Mahoney MRN: [id number] DOB: December 08, 1934  Referred by: Ann Held, DO Reason for referral : No chief complaint on file.   An unsuccessful telephone outreach was attempted today. The patient was referred to the pharmacist for assistance with care management and care coordination.   This note is not being shared with the patient for the following reason: To respect privacy (The patient or proxy has requested that the information not be shared).  Follow Up Plan:   This note is not being shared with the patient for the following reason: To respect privacy (The patient or proxy has requested that the information not be shared).

## 2020-06-02 ENCOUNTER — Other Ambulatory Visit: Payer: Self-pay

## 2020-06-02 ENCOUNTER — Encounter: Payer: Self-pay | Admitting: Family Medicine

## 2020-06-02 DIAGNOSIS — I10 Essential (primary) hypertension: Secondary | ICD-10-CM

## 2020-06-02 DIAGNOSIS — E785 Hyperlipidemia, unspecified: Secondary | ICD-10-CM

## 2020-06-02 DIAGNOSIS — IMO0002 Reserved for concepts with insufficient information to code with codable children: Secondary | ICD-10-CM

## 2020-06-02 DIAGNOSIS — E1121 Type 2 diabetes mellitus with diabetic nephropathy: Secondary | ICD-10-CM

## 2020-06-02 MED ORDER — GLIPIZIDE ER 10 MG PO TB24: 10.0000 mg | ORAL_TABLET | Freq: Every morning | ORAL | 1 refills | Status: DC

## 2020-06-02 MED ORDER — METOPROLOL TARTRATE 100 MG PO TABS: ORAL_TABLET | ORAL | 1 refills | Status: DC

## 2020-06-02 MED ORDER — AMLODIPINE BESYLATE 5 MG PO TABS: 5.0000 mg | ORAL_TABLET | Freq: Every day | ORAL | 1 refills | Status: DC

## 2020-06-02 MED ORDER — ATORVASTATIN CALCIUM 20 MG PO TABS: ORAL_TABLET | ORAL | 1 refills | Status: DC

## 2020-06-24 ENCOUNTER — Other Ambulatory Visit: Payer: Self-pay | Admitting: Internal Medicine

## 2020-06-24 ENCOUNTER — Other Ambulatory Visit: Payer: Self-pay

## 2020-06-24 MED ORDER — INSULIN LISPRO (1 UNIT DIAL) 100 UNIT/ML (KWIKPEN): 7.0000 [IU] | PEN_INJECTOR | Freq: Two times a day (BID) | SUBCUTANEOUS | 0 refills | Status: DC

## 2020-06-24 MED ORDER — INSULIN LISPRO (1 UNIT DIAL) 100 UNIT/ML (KWIKPEN): 7.0000 [IU] | PEN_INJECTOR | Freq: Two times a day (BID) | SUBCUTANEOUS | 3 refills | Status: DC

## 2020-06-27 ENCOUNTER — Telehealth: Payer: Self-pay | Admitting: Endocrinology

## 2020-06-27 ENCOUNTER — Other Ambulatory Visit: Payer: Self-pay

## 2020-06-27 MED ORDER — INSULIN LISPRO (1 UNIT DIAL) 100 UNIT/ML (KWIKPEN): PEN_INJECTOR | SUBCUTANEOUS | 0 refills | Status: DC

## 2020-06-27 NOTE — Telephone Encounter (Signed)
Order has been clarified and resent.

## 2020-06-27 NOTE — Telephone Encounter (Signed)
Katelyn Mahoney from [employer] is calling in reference to insulin lispro (HUMALOG KWIKPEN) 100 UNIT/ML KwikPen , asking for clarification on instructions. Reference number  [id number]

## 2020-07-08 ENCOUNTER — Other Ambulatory Visit: Payer: Self-pay | Admitting: Endocrinology

## 2020-07-18 ENCOUNTER — Other Ambulatory Visit: Payer: Self-pay

## 2020-07-18 MED ORDER — TOUJEO SOLOSTAR 300 UNIT/ML ~~LOC~~ SOPN: 32.0000 [IU] | PEN_INJECTOR | Freq: Every day | SUBCUTANEOUS | 1 refills | Status: DC

## 2020-07-26 ENCOUNTER — Other Ambulatory Visit: Payer: Self-pay | Admitting: Endocrinology

## 2020-08-01 ENCOUNTER — Other Ambulatory Visit: Payer: Self-pay | Admitting: Endocrinology

## 2020-08-02 ENCOUNTER — Other Ambulatory Visit: Payer: Self-pay

## 2020-08-02 ENCOUNTER — Other Ambulatory Visit (INDEPENDENT_AMBULATORY_CARE_PROVIDER_SITE_OTHER): Payer: Medicare Other

## 2020-08-02 DIAGNOSIS — E1165 Type 2 diabetes mellitus with hyperglycemia: Secondary | ICD-10-CM

## 2020-08-02 DIAGNOSIS — Z794 Long term (current) use of insulin: Secondary | ICD-10-CM | POA: Diagnosis not present

## 2020-08-02 LAB — HEMOGLOBIN A1C: Hgb A1c MFr Bld: 6.4 % (ref 4.6–6.5)

## 2020-08-02 LAB — COMPREHENSIVE METABOLIC PANEL
ALT: 21 U/L (ref 0–35)
AST: 23 U/L (ref 0–37)
Albumin: 3.8 g/dL (ref 3.5–5.2)
Alkaline Phosphatase: 75 U/L (ref 39–117)
BUN: 23 mg/dL (ref 6–23)
CO2: 25 mEq/L (ref 19–32)
Calcium: 9.2 mg/dL (ref 8.4–10.5)
Chloride: 106 mEq/L (ref 96–112)
Creatinine, Ser: 1.19 mg/dL (ref 0.40–1.20)
GFR: 43.25 mL/min — ABNORMAL LOW (ref >60.00)
Glucose, Bld: 172 mg/dL — ABNORMAL HIGH (ref 70–99)
Potassium: 5 mEq/L (ref 3.5–5.1)
Sodium: 137 mEq/L (ref 135–145)
Total Bilirubin: 0.6 mg/dL (ref 0.2–1.2)
Total Protein: 7.1 g/dL (ref 6.0–8.3)

## 2020-08-05 ENCOUNTER — Other Ambulatory Visit: Payer: Self-pay

## 2020-08-05 ENCOUNTER — Ambulatory Visit: Payer: Medicare Other | Admitting: Endocrinology

## 2020-08-05 ENCOUNTER — Encounter: Payer: Self-pay | Admitting: Endocrinology

## 2020-08-05 VITALS — BP 142/80 | HR 79 | Ht 64.0 in | Wt 291.2 lb

## 2020-08-05 DIAGNOSIS — E1165 Type 2 diabetes mellitus with hyperglycemia: Secondary | ICD-10-CM | POA: Diagnosis not present

## 2020-08-05 DIAGNOSIS — Z794 Long term (current) use of insulin: Secondary | ICD-10-CM | POA: Diagnosis not present

## 2020-08-05 DIAGNOSIS — E1142 Type 2 diabetes mellitus with diabetic polyneuropathy: Secondary | ICD-10-CM

## 2020-08-05 DIAGNOSIS — I10 Essential (primary) hypertension: Secondary | ICD-10-CM | POA: Diagnosis not present

## 2020-08-05 NOTE — Patient Instructions (Addendum)
Toujeo 34 units daily  Check blood sugars on waking up days 3-4  a week  Also check blood sugars about 2 hours after meals and do this after different meals by rotation  Recommended blood sugar levels on waking up are 90-130 and about 2 hours after meal is 130-160  Please bring your blood sugar monitor to each visit, thank you

## 2020-08-05 NOTE — Progress Notes (Signed)
Patient ID: Katelyn Mahoney, female   DOB: March 04, 1934, 86 y.o.   MRN: [id number]            Reason for Appointment: Follow-up for Type 2 Diabetes  Referring physician: Etter Sjogren   History of Present Illness:          Date of diagnosis of type 2 diabetes mellitus: ?  1983        Background history:   She had previously been on metformin with fairly good control for some time She was also at some point treated with Janumet Apparently subsequently she could not tolerate metformin and had diarrhea This was switched to glipizide about 2 years ago and she is also being treated with Januvia and Chana Bode was started in 9/17  Review of A1c indicates that her levels had been significantly high since 2016 She was initially seen in consultation on 02/27/17  Recent history:       Non-insulin hypoglycemic drugs  : Ozempic 1 mg weekly, glipizide ER 10 mg 1 x a day  INSULIN regimen:Toueo Toujeo 32 units at supper, Humalog 7-9 units at supper and 5 units breakfast  A1c is back down to 6.4, was 7.1   Current management, blood sugar patterns and problems identified:  She has not been checking her blood sugars very consistently especially after meals and only occasionally in the afternoon  Even with going up 4 units on Toujeo compared to [place] her fasting readings are still mostly a little high  She is however concerned about the increased cost of Toujeo and not clear what is covered on her insurance  She will again occasionally forget her breakfast Humalog and may cause higher readings midday  No readings after dinner  She is very consistent with taking her Ozempic once weekly  Unable to do any walking for exercise        Side effects from medications have been: Diarrhea from metformin  Compliance with the medical regimen: Fair  Glucose monitoring:  done 1 +  times a day         Glucometer: One Touch Verio       Blood Glucose readings by patient review of  monitor   PRE-MEAL Fasting Lunch Dinner Bedtime Overall  Glucose range:  127-169      Mean/median:  142     141   POST-MEAL PC Breakfast PC Lunch PC Dinner  Glucose range:   81-185   Mean/median:      Previous readings:  PRE-MEAL Fasting Lunch Dinner Bedtime Overall  Glucose range:  131, 171    125   Mean/median:      172   POST-MEAL PC Breakfast PC Lunch PC Dinner  Glucose range:   246   Mean/median:          Self-care:  Meal times are:  Breakfast is at 9-10 AM Typical meal intake: Breakfast is frequently cheese toast.  Lunch is variable or nothing.  Meals are usually chicken, salad, fish, sometimes vegetables only.  Snacks: Peanuts, yogurt                Dietician visit, most recent:1/19    Weight history: Previous range 249-305  Wt Readings from Last 3 Encounters:  08/05/20 291 lb 3.2 oz (132.1 kg)  05/05/20 296 lb 6.4 oz (134.4 kg)  03/03/20 286 lb 12.8 oz (130.1 kg)    Glycemic control:   Lab Results  Component Value Date   HGBA1C 6.4 08/02/2020   HGBA1C 7.1 (H)  04/28/2020   HGBA1C 6.8 (H) 01/25/2020   Lab Results  Component Value Date   MICROALBUR 3.6 (H) 10/22/2019   LDLCALC 70 03/03/2020   CREATININE 1.19 08/02/2020   Lab Results  Component Value Date   MICRALBCREAT 3.0 10/22/2019   Lab Results  Component Value Date   FRUCTOSAMINE 301 (H) 09/19/2017   FRUCTOSAMINE 312 (H) 06/20/2017   FRUCTOSAMINE 339 (H) 03/27/2017       Allergies as of 08/05/2020      Reactions   Bupropion Hcl    Palmar rash   Penicillins    rash   Citalopram Hydrobromide Other (See Comments)   Unknown   Metformin And Related Diarrhea   Blue Dyes (parenteral) Rash   Cobalt Rash   Gold-containing Drug Products Rash   Nickel Rash      Medication List       Accurate as of August 05, 2020 11:59 PM. If you have any questions, ask your nurse or doctor.        amLODipine 5 MG tablet Commonly known as: NORVASC Take 1 tablet (5 mg total) by mouth daily.    aspirin 81 MG tablet Take 81 mg by mouth. 3 by mouth in the evening   atorvastatin 20 MG tablet Commonly known as: LIPITOR Take 1/2 tablet by mouth on Monday, Wednesday, Friday and Sundays   BD Pen Needle Nano U/F 32G X 4 MM Misc Generic drug: Insulin Pen Needle USE TO INJECT INSULIN INTO THE SKIN 1-3 TIMES DAILY   BILBERRY PO Take 470 mg by mouth. 2 by mouth daily   Claritin 10 MG Caps Generic drug: Loratadine Take 1 capsule by mouth daily as needed.   diclofenac 1.3 % Ptch Commonly known as: Flector APPLY 1 PATCH ONTO THE SKIN TWICE A DAY   fluocinonide cream 0.05 % Commonly known as: LIDEX Apply 1 application topically 2 (two) times daily.   furosemide 40 MG tablet Commonly known as: LASIX Take 1 tablet (40 mg total) by mouth daily.   gabapentin 100 MG capsule Commonly known as: NEURONTIN Take 1 capsule (100 mg total) by mouth 3 (three) times daily.   glipiZIDE 10 MG 24 hr tablet Commonly known as: GLUCOTROL XL Take 1 tablet (10 mg total) by mouth every morning.   GLUCOSAMINE PO Take by mouth daily.   insulin lispro 100 UNIT/ML KwikPen Commonly known as: HumaLOG KwikPen Inject 7-9 units under the skin three times daily before meals.   metoprolol tartrate 100 MG tablet Commonly known as: LOPRESSOR TAKE 1 TABLET TWICE A DAY   multivitamin-lutein Caps capsule Take 1 capsule by mouth daily.   mupirocin cream 2 % Commonly known as: BACTROBAN APPLY 1 APPLICATION        TOPICALLY TWO TIMES A DAY   nystatin cream Commonly known as: MYCOSTATIN Apply 1 application topically 2 (two) times daily.   OneTouch Delica Lancets 23F Misc Use to test blood sugar once daily- E11.21   OneTouch Verio test strip Generic drug: glucose blood Use to test blood sugar twice a day.  DX: E11.21   Ozempic (1 MG/DOSE) 2 MG/1.5ML Sopn Generic drug: Semaglutide (1 MG/DOSE) INJECT 1MG  SUBCUTANEOUSLY  ONCE A WEEK   sertraline 100 MG tablet Commonly known as: ZOLOFT 1/2 tab po  qd   Toujeo SoloStar 300 UNIT/ML Solostar Pen Generic drug: insulin glargine (1 Unit Dial) Inject 32 Units into the skin daily.   Tyler Aas FlexTouch 100 UNIT/ML FlexTouch Pen Generic drug: insulin degludec INJECT 28 UNITS  SUBCUTANEOUSLY DAILY   Tylenol Arthritis Pain 650 MG CR tablet Generic drug: acetaminophen 2 by mouth once daily as needed       Allergies:  Allergies  Allergen Reactions   Bupropion Hcl     Palmar rash   Penicillins     rash   Citalopram Hydrobromide Other (See Comments)    Unknown   Metformin And Related Diarrhea   Blue Dyes (Parenteral) Rash   Cobalt Rash   Gold-Containing Drug Products Rash   Nickel Rash    Past Medical History:  Diagnosis Date   Allergy    Anxiety    Arthritis    Arthritis    Callus    Depression    Diabetes mellitus without complication ([place])    Hyperlipidemia    Hypertension     Past Surgical History:  Procedure Laterality Date   ABDOMINAL HYSTERECTOMY     CHOLECYSTECTOMY     SPLENECTOMY      Family History  Problem Relation Age of Onset   Cancer Mother    Diabetes Mother    Cancer Father    Diabetes Father    Cancer Sister    Diabetes Sister    Cancer Brother    Brain cancer Brother    Diabetes Brother    Diabetes Maternal Grandmother    Diabetes Paternal Grandmother    Cancer - Lung Brother    Diabetes Brother     Social History:  reports that she has never smoked. She has never used smokeless tobacco. She reports that she does not drink alcohol and does not use drugs.   Review of Systems   Lipid history: LDL controlled with 20 mg Lipitor    Lab Results  Component Value Date   CHOL 158 03/03/2020   HDL 60.50 03/03/2020   LDLCALC 70 03/03/2020   LDLDIRECT 78.3 04/16/2013   TRIG 141.0 03/03/2020   CHOLHDL 3 03/03/2020           Hypertension: Controlled on  Metoprolol and amlodipine  Home BP readings recently 125-135/60-78  CKD: Mild and  stable  This is followed by nephrologist   History of HYPERKALEMIA: Potassium is normal, she thinks she is eating more tomatoes   Lab Results  Component Value Date   CREATININE 1.19 08/02/2020   CREATININE 1.32 (H) 04/28/2020   CREATININE 1.43 (H) 03/03/2020   CREATININE 1.36 (H) 01/25/2020   Lab Results  Component Value Date   K 5.0 08/02/2020     Eye exam last 10/2019  NEUROPATHY: She does have peripheral neuropathy with numbness  Most recent foot exam 5/21  Now taking low-dose gabapentin for sharp pains in the left toes started in 5/21  LABS:  Lab on 08/02/2020  Component Date Value Ref Range Status   Sodium 08/02/2020 137  135 - 145 mEq/L Final   Potassium 08/02/2020 5.0  3.5 - 5.1 mEq/L Final   Chloride 08/02/2020 106  96 - 112 mEq/L Final   CO2 08/02/2020 25  19 - 32 mEq/L Final   Glucose, Bld 08/02/2020 172* 70 - 99 mg/dL Final   BUN 08/02/2020 23  6 - 23 mg/dL Final   Creatinine, Ser 08/02/2020 1.19  0.40 - 1.20 mg/dL Final   Total Bilirubin 08/02/2020 0.6  0.2 - 1.2 mg/dL Final   Alkaline Phosphatase 08/02/2020 75  39 - 117 U/L Final   AST 08/02/2020 23  0 - 37 U/L Final   ALT 08/02/2020 21  0 - 35 U/L Final  Total Protein 08/02/2020 7.1  6.0 - 8.3 g/dL Final   Albumin 08/02/2020 3.8  3.5 - 5.2 g/dL Final   GFR 08/02/2020 43.25* >60.00 mL/min Final   Calcium 08/02/2020 9.2  8.4 - 10.5 mg/dL Final   Hgb A1c MFr Bld 08/02/2020 6.4  4.6 - 6.5 % Final   Glycemic Control Guidelines for People with Diabetes:Non Diabetic:  <6%Goal of Therapy: <7%Additional Action Suggested:  >8%     Physical Examination:  BP (!) 142/80 (BP Location: Left Arm, Patient Position: Sitting, Cuff Size: Large)    Pulse 79    Ht 5\' 4"  (1.626 m)    Wt 291 lb 3.2 oz (132.1 kg)    LMP  (LMP Unknown)    SpO2 93%    BMI 49.98 kg/m     ASSESSMENT:  Diabetes type 2, with morbid obesity, insulin requiring  See history of present illness for detailed discussion of  current diabetes management, blood sugar patterns and problems identified   Her A1c is back down to 6.4, previously was higher at 7.1  She is on basal bolus insulin regimen, glipizide and Ozempic  A1c likely better from improving her diet compared to her last visit Overall control reasonably good for her age  Fasting readings still relatively high even with going up to a higher dose on Toujeo compared to [place] No postprandial readings after dinner and occasionally high after lunch based on diet  HYPERTENSION/mild CKD: Creatinine stable  Blood pressure reasonably controlled, probably higher in the office as compared to home readings  NEUROPATHY: She has pains in the left toes and finding of decreased to absent sensation on the right toes     PLAN:   Go up to 34 units on Toujeo to get morning sugars more near normal  Consider switching the Lantus if she is not able to get this or [place] covered, she will need to check on the expense She needs to start checking blood sugars much more after meals especially lunch and dinner to help adjust her mealtime dose Try to be consistent with mealtime insulin Also may consider V-go pump although this would be still contributing to the donut hole Encourage her to be as active as possible Avoid high carbohydrate meals and snacks Continue Ozempic  Continue  gabapentin 100 mg twice daily for neuropathic symptoms  No change in other medications  Follow-up in 3 months  Patient Instructions  Toujeo 34 units daily  Check blood sugars on waking up days 3-4  a week  Also check blood sugars about 2 hours after meals and do this after different meals by rotation  Recommended blood sugar levels on waking up are 90-130 and about 2 hours after meal is 130-160  Please bring your blood sugar monitor to each visit, thank you      Elayne Snare 08/07/2020, 9:08 PM   Note: This office note was prepared with Dragon voice recognition system  technology. Any transcriptional errors that result from this process are unintentional.

## 2020-08-18 ENCOUNTER — Other Ambulatory Visit: Payer: Self-pay | Admitting: Endocrinology

## 2020-08-18 NOTE — Telephone Encounter (Signed)
Please advise 

## 2020-09-04 ENCOUNTER — Other Ambulatory Visit: Payer: Self-pay | Admitting: Endocrinology

## 2020-09-04 ENCOUNTER — Other Ambulatory Visit: Payer: Self-pay | Admitting: Family Medicine

## 2020-09-04 DIAGNOSIS — R600 Localized edema: Secondary | ICD-10-CM

## 2020-09-08 ENCOUNTER — Ambulatory Visit (INDEPENDENT_AMBULATORY_CARE_PROVIDER_SITE_OTHER): Payer: Medicare Other | Admitting: Family Medicine

## 2020-09-08 ENCOUNTER — Other Ambulatory Visit: Payer: Self-pay

## 2020-09-08 ENCOUNTER — Encounter: Payer: Self-pay | Admitting: Family Medicine

## 2020-09-08 VITALS — BP 130/70 | HR 73 | Temp 98.9°F | Resp 20 | Ht 64.0 in | Wt 288.4 lb

## 2020-09-08 DIAGNOSIS — D580 Hereditary spherocytosis: Secondary | ICD-10-CM

## 2020-09-08 DIAGNOSIS — F324 Major depressive disorder, single episode, in partial remission: Secondary | ICD-10-CM

## 2020-09-08 DIAGNOSIS — E1151 Type 2 diabetes mellitus with diabetic peripheral angiopathy without gangrene: Secondary | ICD-10-CM

## 2020-09-08 DIAGNOSIS — E1169 Type 2 diabetes mellitus with other specified complication: Secondary | ICD-10-CM | POA: Diagnosis not present

## 2020-09-08 DIAGNOSIS — E785 Hyperlipidemia, unspecified: Secondary | ICD-10-CM

## 2020-09-08 DIAGNOSIS — Z0000 Encounter for general adult medical examination without abnormal findings: Secondary | ICD-10-CM | POA: Diagnosis not present

## 2020-09-08 DIAGNOSIS — I10 Essential (primary) hypertension: Secondary | ICD-10-CM

## 2020-09-08 DIAGNOSIS — E1165 Type 2 diabetes mellitus with hyperglycemia: Secondary | ICD-10-CM

## 2020-09-08 DIAGNOSIS — IMO0002 Reserved for concepts with insufficient information to code with codable children: Secondary | ICD-10-CM

## 2020-09-08 DIAGNOSIS — N1832 Chronic kidney disease, stage 3b: Secondary | ICD-10-CM

## 2020-09-08 LAB — COMPREHENSIVE METABOLIC PANEL
AG Ratio: 1.4 (calc) (ref 1.0–2.5)
ALT: 12 U/L (ref 6–29)
AST: 15 U/L (ref 10–35)
Albumin: 3.8 g/dL (ref 3.6–5.1)
Alkaline phosphatase (APISO): 72 U/L (ref 37–153)
BUN/Creatinine Ratio: 25 (calc) — ABNORMAL HIGH (ref 6–22)
BUN: 33 mg/dL — ABNORMAL HIGH (ref 7–25)
CO2: 21 mmol/L (ref 20–32)
Calcium: 9.5 mg/dL (ref 8.6–10.4)
Chloride: 108 mmol/L (ref 98–110)
Creat: 1.34 mg/dL — ABNORMAL HIGH (ref 0.60–0.88)
Globulin: 2.8 g/dL (calc) (ref 1.9–3.7)
Glucose, Bld: 183 mg/dL — ABNORMAL HIGH (ref 65–99)
Potassium: 5 mmol/L (ref 3.5–5.3)
Sodium: 140 mmol/L (ref 135–146)
Total Bilirubin: 0.6 mg/dL (ref 0.2–1.2)
Total Protein: 6.6 g/dL (ref 6.1–8.1)

## 2020-09-08 LAB — LIPID PANEL
Cholesterol: 162 mg/dL (ref <200)
HDL: 56 mg/dL (ref >=50)
LDL Cholesterol (Calc): 81 mg/dL (calc)
Non-HDL Cholesterol (Calc): 106 mg/dL (calc) (ref <130)
Total CHOL/HDL Ratio: 2.9 (calc) (ref <5.0)
Triglycerides: 149 mg/dL (ref <150)

## 2020-09-08 MED ORDER — METOPROLOL SUCCINATE ER 100 MG PO TB24: 100.0000 mg | ORAL_TABLET | Freq: Every day | ORAL | 3 refills | Status: DC

## 2020-09-08 MED ORDER — SERTRALINE HCL 100 MG PO TABS: ORAL_TABLET | ORAL | 3 refills | Status: DC

## 2020-09-08 NOTE — Assessment & Plan Note (Signed)
Per nephrology 

## 2020-09-08 NOTE — Patient Instructions (Signed)
Preventive Care 38 Years and Older, Female Preventive care refers to lifestyle choices and visits with your health care provider that can promote health and wellness. This includes:  A yearly physical exam. This is also called an annual well check.  Regular dental and eye exams.  Immunizations.  Screening for certain conditions.  Healthy lifestyle choices, such as diet and exercise. What can I expect for my preventive care visit? Physical exam Your health care provider will check:  Height and weight. These may be used to calculate body mass index (BMI), which is a measurement that tells if you are at a healthy weight.  Heart rate and blood pressure.  Your skin for abnormal spots. Counseling Your health care provider may ask you questions about:  Alcohol, tobacco, and drug use.  Emotional well-being.  Home and relationship well-being.  Sexual activity.  Eating habits.  History of falls.  Memory and ability to understand (cognition).  Work and work Statistician.  Pregnancy and menstrual history. What immunizations do I need?  Influenza (flu) vaccine  This is recommended every year. Tetanus, diphtheria, and pertussis (Tdap) vaccine  You may need a Td booster every 10 years. Varicella (chickenpox) vaccine  You may need this vaccine if you have not already been vaccinated. Zoster (shingles) vaccine  You may need this after age 33. Pneumococcal conjugate (PCV13) vaccine  One dose is recommended after age 33. Pneumococcal polysaccharide (PPSV23) vaccine  One dose is recommended after age 72. Measles, mumps, and rubella (MMR) vaccine  You may need at least one dose of MMR if you were born in 1957 or later. You may also need a second dose. Meningococcal conjugate (MenACWY) vaccine  You may need this if you have certain conditions. Hepatitis A vaccine  You may need this if you have certain conditions or if you travel or work in places where you may be exposed  to hepatitis A. Hepatitis B vaccine  You may need this if you have certain conditions or if you travel or work in places where you may be exposed to hepatitis B. Haemophilus influenzae type b (Hib) vaccine  You may need this if you have certain conditions. You may receive vaccines as individual doses or as more than one vaccine together in one shot (combination vaccines). Talk with your health care provider about the risks and benefits of combination vaccines. What tests do I need? Blood tests  Lipid and cholesterol levels. These may be checked every 5 years, or more frequently depending on your overall health.  Hepatitis C test.  Hepatitis B test. Screening  Lung cancer screening. You may have this screening every year starting at age 39 if you have a 30-pack-year history of smoking and currently smoke or have quit within the past 15 years.  Colorectal cancer screening. All adults should have this screening starting at age 36 and continuing until age 15. Your health care provider may recommend screening at age 23 if you are at increased risk. You will have tests every 1-10 years, depending on your results and the type of screening test.  Diabetes screening. This is done by checking your blood sugar (glucose) after you have not eaten for a while (fasting). You may have this done every 1-3 years.  Mammogram. This may be done every 1-2 years. Talk with your health care provider about how often you should have regular mammograms.  BRCA-related cancer screening. This may be done if you have a family history of breast, ovarian, tubal, or peritoneal cancers.  Other tests  Sexually transmitted disease (STD) testing.  Bone density scan. This is done to screen for osteoporosis. You may have this done starting at age 44. Follow these instructions at home: Eating and drinking  Eat a diet that includes fresh fruits and vegetables, whole grains, lean protein, and low-fat dairy products. Limit  your intake of foods with high amounts of sugar, saturated fats, and salt.  Take vitamin and mineral supplements as recommended by your health care provider.  Do not drink alcohol if your health care provider tells you not to drink.  If you drink alcohol: ? Limit how much you have to 0-1 drink a day. ? Be aware of how much alcohol is in your drink. In the [place] one drink equals one 12 oz bottle of beer (355 mL), one 5 oz glass of wine (148 mL), or one 1 oz glass of hard liquor (44 mL). Lifestyle  Take daily care of your teeth and gums.  Stay active. Exercise for at least 30 minutes on 5 or more days each week.  Do not use any products that contain nicotine or tobacco, such as cigarettes, e-cigarettes, and chewing tobacco. If you need help quitting, ask your health care provider.  If you are sexually active, practice safe sex. Use a condom or other form of protection in order to prevent STIs (sexually transmitted infections).  Talk with your health care provider about taking a low-dose aspirin or statin. What's next?  Go to your health care provider once a year for a well check visit.  Ask your health care provider how often you should have your eyes and teeth checked.  Stay up to date on all vaccines. This information is not intended to replace advice given to you by your health care provider. Make sure you discuss any questions you have with your health care provider. Document Revised: 12/11/2018 Document Reviewed: 12/11/2018 Elsevier Patient Education  2020 [employer].
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
## 2020-09-19 ENCOUNTER — Encounter: Payer: Self-pay | Admitting: Family Medicine

## 2020-09-19 NOTE — Telephone Encounter (Signed)
Please advise 

## 2020-09-19 NOTE — Telephone Encounter (Signed)
We can do virtual if pt does not feel like she is improving

## 2020-09-22 ENCOUNTER — Other Ambulatory Visit: Payer: Self-pay | Admitting: Endocrinology

## 2020-10-01 ENCOUNTER — Encounter: Payer: Self-pay | Admitting: Family Medicine

## 2020-10-03 NOTE — Telephone Encounter (Signed)
Its been over 3 weeks --- she should be fine to get it now

## 2020-10-21 ENCOUNTER — Encounter: Payer: Self-pay | Admitting: *Deleted

## 2020-10-24 LAB — HM DIABETES EYE EXAM

## 2020-11-01 ENCOUNTER — Encounter: Payer: Self-pay | Admitting: *Deleted

## 2020-11-07 ENCOUNTER — Other Ambulatory Visit (INDEPENDENT_AMBULATORY_CARE_PROVIDER_SITE_OTHER): Payer: Medicare Other

## 2020-11-07 ENCOUNTER — Other Ambulatory Visit: Payer: Self-pay

## 2020-11-07 DIAGNOSIS — E1165 Type 2 diabetes mellitus with hyperglycemia: Secondary | ICD-10-CM

## 2020-11-07 DIAGNOSIS — Z794 Long term (current) use of insulin: Secondary | ICD-10-CM

## 2020-11-07 LAB — BASIC METABOLIC PANEL
BUN: 28 mg/dL — ABNORMAL HIGH (ref 6–23)
CO2: 25 mEq/L (ref 19–32)
Calcium: 9.5 mg/dL (ref 8.4–10.5)
Chloride: 104 mEq/L (ref 96–112)
Creatinine, Ser: 1.41 mg/dL — ABNORMAL HIGH (ref 0.40–1.20)
GFR: 34.39 mL/min — ABNORMAL LOW (ref >60.00)
Glucose, Bld: 148 mg/dL — ABNORMAL HIGH (ref 70–99)
Potassium: 4.9 mEq/L (ref 3.5–5.1)
Sodium: 138 mEq/L (ref 135–145)

## 2020-11-07 LAB — HEMOGLOBIN A1C: Hgb A1c MFr Bld: 6.7 % — ABNORMAL HIGH (ref 4.6–6.5)

## 2020-11-11 ENCOUNTER — Other Ambulatory Visit: Payer: Self-pay

## 2020-11-11 ENCOUNTER — Encounter: Payer: Self-pay | Admitting: Endocrinology

## 2020-11-11 ENCOUNTER — Ambulatory Visit: Payer: Medicare Other | Admitting: Endocrinology

## 2020-11-11 VITALS — BP 130/84 | HR 82 | Ht 64.0 in | Wt 287.4 lb

## 2020-11-11 DIAGNOSIS — Z794 Long term (current) use of insulin: Secondary | ICD-10-CM | POA: Diagnosis not present

## 2020-11-11 DIAGNOSIS — E1142 Type 2 diabetes mellitus with diabetic polyneuropathy: Secondary | ICD-10-CM

## 2020-11-11 DIAGNOSIS — E1165 Type 2 diabetes mellitus with hyperglycemia: Secondary | ICD-10-CM | POA: Diagnosis not present

## 2020-11-11 NOTE — Progress Notes (Signed)
Patient ID: Katelyn Mahoney, female   DOB: 22-Sep-1934, 86 y.o.   MRN: [id number]            Reason for Appointment: Follow-up for Type 2 Diabetes  Referring physician: Etter Sjogren   History of Present Illness:          Date of diagnosis of type 2 diabetes mellitus: ?  1983        Background history:   She had previously been on metformin with fairly good control for some time She was also at some point treated with Janumet Apparently subsequently she could not tolerate metformin and had diarrhea This was switched to glipizide about 2 years ago and she is also being treated with Januvia and Chana Bode was started in 9/17  Review of A1c indicates that her levels had been significantly high since 2016 She was initially seen in consultation on 02/27/17  Recent history:       Non-insulin hypoglycemic drugs  : Ozempic 1 mg weekly, glipizide ER 10 mg 1 x a day  INSULIN regimen: Toujeo 34 units at supper, Humalog 9 units at supper and 5 units breakfast  A1c is slightly higher at 6.7, previously down to 6.4,  Current management, blood sugar patterns and problems identified:  She has somewhat better blood sugars recently especially fasting with increasing her today on the last visit  Previously had required lower doses of [place]  She is not checking enough readings after meals but highest reading recently is only 170 after lunch  She has had minimal weight loss  However not clear why her A1c is slightly higher than before  Has no difficulties with getting all her medications filled and no recent issues with cost  She thinks that she generally watching her diet  Unable to do any walking for exercise        Side effects from medications have been: Diarrhea from metformin  Compliance with the medical regimen: Fair  Glucose monitoring:  done 1 +  times a day         Glucometer: One Touch Verio       Blood Glucose readings by download   PRE-MEAL Fasting Lunch Dinner  Bedtime Overall  Glucose range:  84-156   129, 158  99, 151   Mean/median:  118     125   POST-MEAL PC Breakfast PC Lunch PC Dinner  Glucose range:   107-170   Mean/median:   129    Previously:   PRE-MEAL Fasting Lunch Dinner Bedtime Overall  Glucose range:  127-169      Mean/median:  142     141   POST-MEAL PC Breakfast PC Lunch PC Dinner  Glucose range:   81-185   Mean/median:        Self-care:  Meal times are:  Breakfast is at 9-10 AM Typical meal intake: Breakfast is frequently cheese toast.  Lunch is variable or nothing.  Meals are usually chicken, salad, fish, sometimes vegetables only.  Snacks: Peanuts, yogurt                Dietician visit, most recent:1/19    Weight history: Previous range 249-305  Wt Readings from Last 3 Encounters:  11/11/20 287 lb 6.4 oz (130.4 kg)  09/08/20 288 lb 6.4 oz (130.8 kg)  08/05/20 291 lb 3.2 oz (132.1 kg)    Glycemic control:   Lab Results  Component Value Date   HGBA1C 6.7 (H) 11/07/2020   HGBA1C 6.4 08/02/2020   HGBA1C  7.1 (H) 04/28/2020   Lab Results  Component Value Date   MICROALBUR 3.6 (H) 10/22/2019   LDLCALC 81 09/08/2020   CREATININE 1.41 (H) 11/07/2020   Lab Results  Component Value Date   MICRALBCREAT 3.0 10/22/2019   Lab Results  Component Value Date   FRUCTOSAMINE 301 (H) 09/19/2017   FRUCTOSAMINE 312 (H) 06/20/2017   FRUCTOSAMINE 339 (H) 03/27/2017       Allergies as of 11/11/2020      Reactions   Bupropion Hcl    Palmar rash   Penicillins    rash   Citalopram Hydrobromide Other (See Comments)   Unknown   Metformin And Related Diarrhea   Blue Dyes (parenteral) Rash   Cobalt Rash   Gold-containing Drug Products Rash   Nickel Rash      Medication List       Accurate as of November 11, 2020  9:11 AM. If you have any questions, ask your nurse or doctor.        amLODipine 5 MG tablet Commonly known as: NORVASC Take 1 tablet (5 mg total) by mouth daily.   aspirin 81 MG  tablet Take 81 mg by mouth. 3 by mouth in the evening   atorvastatin 20 MG tablet Commonly known as: LIPITOR Take 1/2 tablet by mouth on Monday, Wednesday, Friday and Sundays   BD Pen Needle Nano U/F 32G X 4 MM Misc Generic drug: Insulin Pen Needle USE TO INJECT INSULIN INTO THE SKIN 1-3 TIMES DAILY   BILBERRY PO Take 470 mg by mouth. 2 by mouth daily   Claritin 10 MG Caps Generic drug: Loratadine Take 1 capsule by mouth daily as needed.   diclofenac 1.3 % Ptch Commonly known as: Flector APPLY 1 PATCH ONTO THE SKIN TWICE A DAY   fluocinonide cream 0.05 % Commonly known as: LIDEX Apply 1 application topically 2 (two) times daily.   furosemide 40 MG tablet Commonly known as: LASIX TAKE 1 TABLET DAILY   gabapentin 100 MG capsule Commonly known as: NEURONTIN TAKE 1 CAPSULE BY MOUTH 3 TIMES DAILY   glipiZIDE 10 MG 24 hr tablet Commonly known as: GLUCOTROL XL Take 1 tablet (10 mg total) by mouth every morning.   GLUCOSAMINE PO Take by mouth daily.   insulin lispro 100 UNIT/ML KwikPen Commonly known as: HumaLOG KwikPen Inject 7-9 units under the skin three times daily before meals.   metoprolol succinate 100 MG 24 hr tablet Commonly known as: TOPROL-XL Take 1 tablet (100 mg total) by mouth daily. Take with or immediately following a meal.   multivitamin-lutein Caps capsule Take 1 capsule by mouth daily.   mupirocin cream 2 % Commonly known as: BACTROBAN APPLY 1 APPLICATION        TOPICALLY TWO TIMES A DAY   nystatin cream Commonly known as: MYCOSTATIN Apply 1 application topically 2 (two) times daily.   OneTouch Delica Lancets 56Y Misc Use to test blood sugar once daily- E11.21   OneTouch Verio test strip Generic drug: glucose blood Use to test blood sugar twice a day.  DX: E11.21   Ozempic (1 MG/DOSE) 2 MG/1.5ML Sopn Generic drug: Semaglutide (1 MG/DOSE) INJECT 1MG  SUBCUTANEOUSLY  ONCE A WEEK   sertraline 100 MG tablet Commonly known as: ZOLOFT 1 tab  po qd   Toujeo SoloStar 300 UNIT/ML Solostar Pen Generic drug: insulin glargine (1 Unit Dial) Inject 32 Units into the skin daily. What changed: how much to take   Tylenol Arthritis Pain 650 MG CR tablet Generic  drug: acetaminophen 2 by mouth once daily as needed       Allergies:  Allergies  Allergen Reactions  . Bupropion Hcl     Palmar rash  . Penicillins     rash  . Citalopram Hydrobromide Other (See Comments)    Unknown  . Metformin And Related Diarrhea  . Blue Dyes (Parenteral) Rash  . Cobalt Rash  . Gold-Containing Drug Products Rash  . Nickel Rash    Past Medical History:  Diagnosis Date  . Allergy   . Anxiety   . Arthritis   . Arthritis   . Callus   . Depression   . Diabetes mellitus without complication ([place])   . Hyperlipidemia   . Hypertension     Past Surgical History:  Procedure Laterality Date  . ABDOMINAL HYSTERECTOMY    . CHOLECYSTECTOMY    . SPLENECTOMY      Family History  Problem Relation Age of Onset  . Cancer Mother   . Diabetes Mother   . Cancer Father   . Diabetes Father   . Cancer Sister   . Diabetes Sister   . Cancer Brother   . Brain cancer Brother   . Diabetes Brother   . Diabetes Maternal Grandmother   . Diabetes Paternal Grandmother   . Cancer - Lung Brother   . Diabetes Brother     Social History:  reports that she has never smoked. She has never used smokeless tobacco. She reports that she does not drink alcohol and does not use drugs.   Review of Systems   Lipid history: LDL controlled with 20 mg Lipitor    Lab Results  Component Value Date   CHOL 162 09/08/2020   HDL 56 09/08/2020   LDLCALC 81 09/08/2020   LDLDIRECT 78.3 04/16/2013   TRIG 149 09/08/2020   CHOLHDL 2.9 09/08/2020           Hypertension: Controlled on  Metoprolol and amlodipine. Lasix taken only occasionally  BP Readings from Last 3 Encounters:  11/11/20 130/84  09/08/20 130/70  08/05/20 (!) 142/80     Home BP readings recently  125-135/60-78  CKD: Mild and stable  This is followed by nephrologist periodically  History of HYPERKALEMIA: Potassium is normal    Lab Results  Component Value Date   CREATININE 1.41 (H) 11/07/2020   CREATININE 1.34 (H) 09/08/2020   CREATININE 1.19 08/02/2020   CREATININE 1.32 (H) 04/28/2020   Lab Results  Component Value Date   K 4.9 11/07/2020     Eye exam last 10/2019  NEUROPATHY: She does have peripheral neuropathy with numbness  Most recent foot exam 5/21  Still taking low-dose gabapentin for sharp pains in the left toes started in 5/21  LABS:  Lab on 11/07/2020  Component Date Value Ref Range Status  . Sodium 11/07/2020 138  135 - 145 mEq/L Final  . Potassium 11/07/2020 4.9  3.5 - 5.1 mEq/L Final  . Chloride 11/07/2020 104  96 - 112 mEq/L Final  . CO2 11/07/2020 25  19 - 32 mEq/L Final  . Glucose, Bld 11/07/2020 148* 70 - 99 mg/dL Final  . BUN 11/07/2020 28* 6 - 23 mg/dL Final  . Creatinine, Ser 11/07/2020 1.41* 0.40 - 1.20 mg/dL Final  . GFR 11/07/2020 34.39* >60.00 mL/min Final   Calculated using the CKD-EPI Creatinine Equation (2021)  . Calcium 11/07/2020 9.5  8.4 - 10.5 mg/dL Final  . Hgb A1c MFr Bld 11/07/2020 6.7* 4.6 - 6.5 % Final   Glycemic  Control Guidelines for People with Diabetes:Non Diabetic:  <6%Goal of Therapy: <7%Additional Action Suggested:  >8%     Physical Examination:  BP 130/84   Pulse 82   Ht 5\' 4"  (1.626 m)   Wt 287 lb 6.4 oz (130.4 kg)   LMP  (LMP Unknown)   SpO2 94%   BMI 49.33 kg/m     ASSESSMENT:  Diabetes type 2, with morbid obesity, insulin requiring  See history of present illness for detailed discussion of current diabetes management, blood sugar patterns and problems identified   She is on basal bolus insulin regimen, glipizide and Ozempic  A1c is reasonably good at 6.7 considering her age Home blood sugars are also excellent usually and no hypoglycemia also Again has difficulty losing weight and BMI is  nearly 50 again with associated hypertension and dyslipidemia  HYPERTENSION/mild CKD: Creatinine is somewhat worse and likely need to avoid Lasix or reduce the dose  Blood pressure usually controlled, appears higher in the office as compared to home readings  NEUROPATHY: She has relief of pains in the left toes with gabapentin low doses     PLAN:   Stay on 34 units on Toujeo She does need to check more readings after dinner to help adjust her suppertime Humalog She will try to be consistent with taking her Humalog before starting to read Continue working on cutting back on calories and carbohydrates  She will try to take Lasix only half a tablet as needed instead of 40 mg  Continue  gabapentin 100 mg twice daily for neuropathic symptoms  Continue to follow-up with PCP for lipids and blood pressure also and periodically check at home  Follow-up in 4 months  There are no Patient Instructions on file for this visit.   Elayne Snare 11/11/2020, 9:11 AM   Note: This office note was prepared with Dragon voice recognition system technology. Any transcriptional errors that result from this process are unintentional.

## 2020-11-11 NOTE — Patient Instructions (Addendum)
Try 1/2 Lasix at a time  More sugars after supper  Check blood sugars on waking up 2-3 days a week  Also check blood sugars about 2 hours after meals and do this after different meals by rotation  Recommended blood sugar levels on waking up are 90-130 and about 2 hours after meal is 130-160  Please bring your blood sugar monitor to each visit, thank you

## 2020-11-26 ENCOUNTER — Other Ambulatory Visit: Payer: Self-pay | Admitting: Endocrinology

## 2020-12-10 ENCOUNTER — Other Ambulatory Visit: Payer: Self-pay | Admitting: Endocrinology

## 2020-12-10 DIAGNOSIS — E1165 Type 2 diabetes mellitus with hyperglycemia: Secondary | ICD-10-CM

## 2020-12-19 ENCOUNTER — Other Ambulatory Visit: Payer: Self-pay | Admitting: Endocrinology

## 2021-01-11 ENCOUNTER — Other Ambulatory Visit: Payer: Self-pay | Admitting: Endocrinology

## 2021-02-09 ENCOUNTER — Other Ambulatory Visit: Payer: Self-pay | Admitting: Endocrinology

## 2021-02-12 ENCOUNTER — Other Ambulatory Visit: Payer: Self-pay | Admitting: Endocrinology

## 2021-02-25 ENCOUNTER — Other Ambulatory Visit: Payer: Self-pay | Admitting: Family Medicine

## 2021-02-25 ENCOUNTER — Other Ambulatory Visit: Payer: Self-pay | Admitting: Endocrinology

## 2021-02-25 DIAGNOSIS — E785 Hyperlipidemia, unspecified: Secondary | ICD-10-CM

## 2021-02-25 DIAGNOSIS — E1151 Type 2 diabetes mellitus with diabetic peripheral angiopathy without gangrene: Secondary | ICD-10-CM

## 2021-02-25 DIAGNOSIS — IMO0002 Reserved for concepts with insufficient information to code with codable children: Secondary | ICD-10-CM

## 2021-02-25 DIAGNOSIS — E1121 Type 2 diabetes mellitus with diabetic nephropathy: Secondary | ICD-10-CM

## 2021-03-09 ENCOUNTER — Ambulatory Visit: Payer: Medicare Other | Admitting: Family Medicine

## 2021-03-09 ENCOUNTER — Ambulatory Visit (INDEPENDENT_AMBULATORY_CARE_PROVIDER_SITE_OTHER): Payer: Medicare Other

## 2021-03-09 ENCOUNTER — Other Ambulatory Visit: Payer: Self-pay

## 2021-03-09 ENCOUNTER — Encounter: Payer: Self-pay | Admitting: Family Medicine

## 2021-03-09 VITALS — BP 124/78 | HR 81 | Temp 97.7°F | Resp 20 | Ht 64.0 in | Wt 295.4 lb

## 2021-03-09 VITALS — Ht 64.0 in | Wt 295.0 lb

## 2021-03-09 DIAGNOSIS — D580 Hereditary spherocytosis: Secondary | ICD-10-CM

## 2021-03-09 DIAGNOSIS — E1165 Type 2 diabetes mellitus with hyperglycemia: Secondary | ICD-10-CM | POA: Diagnosis not present

## 2021-03-09 DIAGNOSIS — I10 Essential (primary) hypertension: Secondary | ICD-10-CM | POA: Diagnosis not present

## 2021-03-09 DIAGNOSIS — M170 Bilateral primary osteoarthritis of knee: Secondary | ICD-10-CM

## 2021-03-09 DIAGNOSIS — Z0000 Encounter for general adult medical examination without abnormal findings: Secondary | ICD-10-CM | POA: Diagnosis not present

## 2021-03-09 DIAGNOSIS — Z780 Asymptomatic menopausal state: Secondary | ICD-10-CM | POA: Diagnosis not present

## 2021-03-09 DIAGNOSIS — IMO0002 Reserved for concepts with insufficient information to code with codable children: Secondary | ICD-10-CM

## 2021-03-09 DIAGNOSIS — E1151 Type 2 diabetes mellitus with diabetic peripheral angiopathy without gangrene: Secondary | ICD-10-CM

## 2021-03-09 DIAGNOSIS — F3289 Other specified depressive episodes: Secondary | ICD-10-CM

## 2021-03-09 DIAGNOSIS — E785 Hyperlipidemia, unspecified: Secondary | ICD-10-CM

## 2021-03-09 DIAGNOSIS — F324 Major depressive disorder, single episode, in partial remission: Secondary | ICD-10-CM

## 2021-03-09 DIAGNOSIS — E1169 Type 2 diabetes mellitus with other specified complication: Secondary | ICD-10-CM

## 2021-03-09 DIAGNOSIS — R350 Frequency of micturition: Secondary | ICD-10-CM | POA: Diagnosis not present

## 2021-03-09 DIAGNOSIS — L308 Other specified dermatitis: Secondary | ICD-10-CM

## 2021-03-09 LAB — CBC WITH DIFFERENTIAL/PLATELET
Basophils Absolute: 0.1 10*3/uL (ref 0.0–0.1)
Basophils Relative: 0.9 % (ref 0.0–3.0)
Eosinophils Absolute: 0.5 10*3/uL (ref 0.0–0.7)
Eosinophils Relative: 4.3 % (ref 0.0–5.0)
HCT: 40.1 % (ref 36.0–46.0)
Hemoglobin: 13.6 g/dL (ref 12.0–15.0)
Lymphocytes Relative: 18.8 % (ref 12.0–46.0)
Lymphs Abs: 2.1 10*3/uL (ref 0.7–4.0)
MCHC: 34 g/dL (ref 30.0–36.0)
MCV: 99.1 fl (ref 78.0–100.0)
Monocytes Absolute: 1.2 10*3/uL — ABNORMAL HIGH (ref 0.1–1.0)
Monocytes Relative: 10.8 % (ref 3.0–12.0)
Neutro Abs: 7.2 10*3/uL (ref 1.4–7.7)
Neutrophils Relative %: 65.2 % (ref 43.0–77.0)
Platelets: 337 10*3/uL (ref 150.0–400.0)
RBC: 4.04 Mil/uL (ref 3.87–5.11)
RDW: 16 % — ABNORMAL HIGH (ref 11.5–15.5)
WBC: 11 10*3/uL — ABNORMAL HIGH (ref 4.0–10.5)

## 2021-03-09 LAB — COMPREHENSIVE METABOLIC PANEL
ALT: 13 U/L (ref 0–35)
AST: 13 U/L (ref 0–37)
Albumin: 3.7 g/dL (ref 3.5–5.2)
Alkaline Phosphatase: 72 U/L (ref 39–117)
BUN: 31 mg/dL — ABNORMAL HIGH (ref 6–23)
CO2: 26 mEq/L (ref 19–32)
Calcium: 9.2 mg/dL (ref 8.4–10.5)
Chloride: 107 mEq/L (ref 96–112)
Creatinine, Ser: 1.41 mg/dL — ABNORMAL HIGH (ref 0.40–1.20)
GFR: 34.31 mL/min — ABNORMAL LOW (ref >60.00)
Glucose, Bld: 161 mg/dL — ABNORMAL HIGH (ref 70–99)
Potassium: 5.5 mEq/L — ABNORMAL HIGH (ref 3.5–5.1)
Sodium: 140 mEq/L (ref 135–145)
Total Bilirubin: 0.6 mg/dL (ref 0.2–1.2)
Total Protein: 6.7 g/dL (ref 6.0–8.3)

## 2021-03-09 LAB — POC URINALSYSI DIPSTICK (AUTOMATED)
Bilirubin, UA: NEGATIVE
Blood, UA: NEGATIVE
Glucose, UA: NEGATIVE
Ketones, UA: NEGATIVE
Nitrite, UA: NEGATIVE
Protein, UA: POSITIVE — AB
Spec Grav, UA: 1.02 (ref 1.010–1.025)
Urobilinogen, UA: 0.2 E.U./dL
pH, UA: 6.5 (ref 5.0–8.0)

## 2021-03-09 LAB — LIPID PANEL
Cholesterol: 143 mg/dL (ref 0–200)
HDL: 58.8 mg/dL (ref >39.00)
LDL Cholesterol: 61 mg/dL (ref 0–99)
NonHDL: 83.94
Total CHOL/HDL Ratio: 2
Triglycerides: 115 mg/dL (ref 0.0–149.0)
VLDL: 23 mg/dL (ref 0.0–40.0)

## 2021-03-09 LAB — MICROALBUMIN / CREATININE URINE RATIO
Creatinine,U: 132.2 mg/dL
Microalb Creat Ratio: 4.8 mg/g (ref 0.0–30.0)
Microalb, Ur: 6.3 mg/dL — ABNORMAL HIGH (ref 0.0–1.9)

## 2021-03-09 MED ORDER — TRIAMCINOLONE ACETONIDE 0.1 % EX CREA: 1.0000 "application " | TOPICAL_CREAM | Freq: Two times a day (BID) | CUTANEOUS | 0 refills | Status: DC

## 2021-03-09 MED ORDER — NONFORMULARY OR COMPOUNDED ITEM: 0 refills | Status: AC

## 2021-03-09 NOTE — Assessment & Plan Note (Signed)
Released by hematology 

## 2021-03-09 NOTE — Assessment & Plan Note (Signed)
Well controlled, no changes to meds. Encouraged heart healthy diet such as the DASH diet and exercise as tolerated.  °

## 2021-03-09 NOTE — Assessment & Plan Note (Signed)
Per endocrine  

## 2021-03-09 NOTE — Patient Instructions (Signed)
Katelyn Mahoney , Thank you for taking time to complete your Medicare Wellness Visit. I appreciate your ongoing commitment to your health goals. Please review the following plan we discussed and let me know if I can assist you in the future.   Screening recommendations/referrals: Colonoscopy: No longer required Mammogram: Declined Bone Density: Ordered today. Someone will be calling you to schedule. Recommended yearly ophthalmology/optometry visit for glaucoma screening and checkup Recommended yearly dental visit for hygiene and checkup  Vaccinations: Influenza vaccine: Up to date Pneumococcal vaccine: Completed vaccines Tdap vaccine: Up to date-Due-04/07/2024 Shingles vaccine: Discuss with pharmacy Covid-19:Completed vaccines  Advanced directives: Information mailed today  Conditions/risks identified: See problem list  Next appointment: Follow up in one year for your annual wellness visit 03/15/2022 @ 11:00   Preventive Care 65 Years and Older, Female Preventive care refers to lifestyle choices and visits with your health care provider that can promote health and wellness. What does preventive care include?  A yearly physical exam. This is also called an annual well check.  Dental exams once or twice a year.  Routine eye exams. Ask your health care provider how often you should have your eyes checked.  Personal lifestyle choices, including:  Daily care of your teeth and gums.  Regular physical activity.  Eating a healthy diet.  Avoiding tobacco and drug use.  Limiting alcohol use.  Practicing safe sex.  Taking low-dose aspirin every day.  Taking vitamin and mineral supplements as recommended by your health care provider. What happens during an annual well check? The services and screenings done by your health care provider during your annual well check will depend on your age, overall health, lifestyle risk factors, and family history of disease. Counseling  Your health  care provider may ask you questions about your:  Alcohol use.  Tobacco use.  Drug use.  Emotional well-being.  Home and relationship well-being.  Sexual activity.  Eating habits.  History of falls.  Memory and ability to understand (cognition).  Work and work Statistician.  Reproductive health. Screening  You may have the following tests or measurements:  Height, weight, and BMI.  Blood pressure.  Lipid and cholesterol levels. These may be checked every 5 years, or more frequently if you are over 40 years old.  Skin check.  Lung cancer screening. You may have this screening every year starting at age 30 if you have a 30-pack-year history of smoking and currently smoke or have quit within the past 15 years.  Fecal occult blood test (FOBT) of the stool. You may have this test every year starting at age 51.  Flexible sigmoidoscopy or colonoscopy. You may have a sigmoidoscopy every 5 years or a colonoscopy every 10 years starting at age 56.  Hepatitis C blood test.  Hepatitis B blood test.  Sexually transmitted disease (STD) testing.  Diabetes screening. This is done by checking your blood sugar (glucose) after you have not eaten for a while (fasting). You may have this done every 1-3 years.  Bone density scan. This is done to screen for osteoporosis. You may have this done starting at age 21.  Mammogram. This may be done every 1-2 years. Talk to your health care provider about how often you should have regular mammograms. Talk with your health care provider about your test results, treatment options, and if necessary, the need for more tests. Vaccines  Your health care provider may recommend certain vaccines, such as:  Influenza vaccine. This is recommended every year.  Tetanus, diphtheria,  and acellular pertussis (Tdap, Td) vaccine. You may need a Td booster every 10 years.  Zoster vaccine. You may need this after age 81.  Pneumococcal 13-valent conjugate  (PCV13) vaccine. One dose is recommended after age 21.  Pneumococcal polysaccharide (PPSV23) vaccine. One dose is recommended after age 37. Talk to your health care provider about which screenings and vaccines you need and how often you need them. This information is not intended to replace advice given to you by your health care provider. Make sure you discuss any questions you have with your health care provider. Document Released: 01/13/2016 Document Revised: 09/05/2016 Document Reviewed: 10/18/2015 Elsevier Interactive Patient Education  2017 [place] Prevention in the Home Falls can cause injuries. They can happen to people of all ages. There are many things you can do to make your home safe and to help prevent falls. What can I do on the outside of my home?  Regularly fix the edges of walkways and driveways and fix any cracks.  Remove anything that might make you trip as you walk through a door, such as a raised step or threshold.  Trim any bushes or trees on the path to your home.  Use bright outdoor lighting.  Clear any walking paths of anything that might make someone trip, such as rocks or tools.  Regularly check to see if handrails are loose or broken. Make sure that both sides of any steps have handrails.  Any raised decks and porches should have guardrails on the edges.  Have any leaves, snow, or ice cleared regularly.  Use sand or salt on walking paths during winter.  Clean up any spills in your garage right away. This includes oil or grease spills. What can I do in the bathroom?  Use night lights.  Install grab bars by the toilet and in the tub and shower. Do not use towel bars as grab bars.  Use non-skid mats or decals in the tub or shower.  If you need to sit down in the shower, use a plastic, non-slip stool.  Keep the floor dry. Clean up any water that spills on the floor as soon as it happens.  Remove soap buildup in the tub or shower  regularly.  Attach bath mats securely with double-sided non-slip rug tape.  Do not have throw rugs and other things on the floor that can make you trip. What can I do in the bedroom?  Use night lights.  Make sure that you have a light by your bed that is easy to reach.  Do not use any sheets or blankets that are too big for your bed. They should not hang down onto the floor.  Have a firm chair that has side arms. You can use this for support while you get dressed.  Do not have throw rugs and other things on the floor that can make you trip. What can I do in the kitchen?  Clean up any spills right away.  Avoid walking on wet floors.  Keep items that you use a lot in easy-to-reach places.  If you need to reach something above you, use a strong step stool that has a grab bar.  Keep electrical cords out of the way.  Do not use floor polish or wax that makes floors slippery. If you must use wax, use non-skid floor wax.  Do not have throw rugs and other things on the floor that can make you trip. What can I do with my  stairs?  Do not leave any items on the stairs.  Make sure that there are handrails on both sides of the stairs and use them. Fix handrails that are broken or loose. Make sure that handrails are as long as the stairways.  Check any carpeting to make sure that it is firmly attached to the stairs. Fix any carpet that is loose or worn.  Avoid having throw rugs at the top or bottom of the stairs. If you do have throw rugs, attach them to the floor with carpet tape.  Make sure that you have a light switch at the top of the stairs and the bottom of the stairs. If you do not have them, ask someone to add them for you. What else can I do to help prevent falls?  Wear shoes that:  Do not have high heels.  Have rubber bottoms.  Are comfortable and fit you well.  Are closed at the toe. Do not wear sandals.  If you use a stepladder:  Make sure that it is fully  opened. Do not climb a closed stepladder.  Make sure that both sides of the stepladder are locked into place.  Ask someone to hold it for you, if possible.  Clearly mark and make sure that you can see:  Any grab bars or handrails.  First and last steps.  Where the edge of each step is.  Use tools that help you move around (mobility aids) if they are needed. These include:  Canes.  Walkers.  Scooters.  Crutches.  Turn on the lights when you go into a dark area. Replace any light bulbs as soon as they burn out.  Set up your furniture so you have a clear path. Avoid moving your furniture around.  If any of your floors are uneven, fix them.  If there are any pets around you, be aware of where they are.  Review your medicines with your doctor. Some medicines can make you feel dizzy. This can increase your chance of falling. Ask your doctor what other things that you can do to help prevent falls. This information is not intended to replace advice given to you by your health care provider. Make sure you discuss any questions you have with your health care provider. Document Released: 10/13/2009 Document Revised: 05/24/2016 Document Reviewed: 01/21/2015 Elsevier Interactive Patient Education  2017 [employer].

## 2021-03-09 NOTE — Patient Instructions (Signed)

## 2021-03-09 NOTE — Assessment & Plan Note (Signed)
Stable

## 2021-03-09 NOTE — Progress Notes (Signed)
Patient ID: Katelyn Mahoney, female    DOB: [birth year]  Age: 87 y.o. MRN: [id number]    Subjective:  Subjective  HPI Katelyn Mahoney presents for f/u bp and chol.  She also c/o dry patch on her side that is very itchy.    Review of Systems  Constitutional: Negative for appetite change, diaphoresis, fatigue and unexpected weight change.  Eyes: Negative for pain, redness and visual disturbance.  Respiratory: Negative for cough, chest tightness, shortness of breath and wheezing.   Cardiovascular: Negative for chest pain, palpitations and leg swelling.  Endocrine: Negative for cold intolerance, heat intolerance, polydipsia, polyphagia and polyuria.  Genitourinary: Negative for difficulty urinating, dysuria and frequency.  Neurological: Negative for dizziness, light-headedness, numbness and headaches.    History Past Medical History:  Diagnosis Date  . Allergy   . Anxiety   . Arthritis   . Arthritis   . Callus   . Depression   . Diabetes mellitus without complication ([place])   . Hyperlipidemia   . Hypertension     She has a past surgical history that includes Cholecystectomy; Abdominal hysterectomy; and Splenectomy.   Her family history includes Brain cancer in her brother; Cancer in her brother, father, mother, and sister; Cancer - Lung in her brother; Diabetes in her brother, brother, father, maternal grandmother, mother, paternal grandmother, and sister.She reports that she has never smoked. She has never used smokeless tobacco. She reports that she does not drink alcohol and does not use drugs.  Current Outpatient Medications on File Prior to Visit  Medication Sig Dispense Refill  . acetaminophen (TYLENOL) 650 MG CR tablet 2 by mouth once daily as needed    . amLODipine (NORVASC) 5 MG tablet TAKE 1 TABLET DAILY 90 tablet 1  . aspirin 81 MG tablet Take 81 mg by mouth. 3 by mouth in the evening    . atorvastatin (LIPITOR) 20 MG tablet TAKE 1/2 TABLET ON MONDAY, WEDNESDAY, FRIDAY  AND      SUNDAYS 26 tablet 1  . Bilberry, Vaccinium myrtillus, (BILBERRY PO) Take 470 mg by mouth. 2 by mouth daily    . fluocinonide cream (LIDEX) [id number] % Apply 1 application topically 2 (two) times daily.    . furosemide (LASIX) 40 MG tablet TAKE 1 TABLET DAILY 90 tablet 3  . gabapentin (NEURONTIN) 100 MG capsule TAKE 1 CAPSULE BY MOUTH 3 TIMES DAILY 90 capsule 3  . glipiZIDE (GLUCOTROL XL) 10 MG 24 hr tablet TAKE 1 TABLET EVERY MORNING 90 tablet 1  . Glucosamine HCl (GLUCOSAMINE PO) Take by mouth daily.    Marland Kitchen glucose blood (ONETOUCH VERIO) test strip USE TO TEST BLOOD SUGAR    TWICE A DAY 200 strip 3  . insulin glargine, 1 Unit Dial, (TOUJEO SOLOSTAR) 300 UNIT/ML Solostar Pen Inject 32 Units into the skin daily. (Patient taking differently: Inject 34 Units into the skin daily.) 13.5 pen 1  . insulin lispro (HUMALOG KWIKPEN) 100 UNIT/ML KwikPen Inject 7-9 units under the skin three times daily before meals. 30 mL 0  . Insulin Pen Needle (BD PEN NEEDLE NANO U/F) 32G X 4 MM MISC USE TO INJECT INSULIN INTO THE SKIN 1-3 TIMES DAILY 200 each 4  . Loratadine 10 MG CAPS Take 1 capsule by mouth daily as needed.     . metoprolol succinate (TOPROL-XL) 100 MG 24 hr tablet Take 1 tablet (100 mg total) by mouth daily. Take with or immediately following a meal. 90 tablet 3  . multivitamin-lutein (OCUVITE-LUTEIN) CAPS capsule Take 1  capsule by mouth daily. 90 capsule 3  . mupirocin cream (BACTROBAN) 2 % APPLY 1 APPLICATION        TOPICALLY TWO TIMES A DAY 15 g 0  . nystatin cream (MYCOSTATIN) Apply 1 application topically 2 (two) times daily. 30 g 0  . ONETOUCH DELICA LANCETS 99991111 MISC Use to test blood sugar once daily- E11.21 100 each 3  . OZEMPIC, 1 MG/DOSE, 4 MG/3ML SOPN INJECT '1MG'$  SUBCUTANEOUSLY  ONCE A WEEK. 9 mL 1  . sertraline (ZOLOFT) 100 MG tablet 1 tab po qd 90 tablet 3   No current facility-administered medications on file prior to visit.     Objective:  Objective  Physical Exam Vitals and  nursing note reviewed.  Constitutional:      Appearance: She is well-developed.  HENT:     Head: Normocephalic and atraumatic.  Eyes:     Conjunctiva/sclera: Conjunctivae normal.  Neck:     Thyroid: No thyromegaly.     Vascular: No carotid bruit or JVD.  Cardiovascular:     Rate and Rhythm: Normal rate and regular rhythm.     Heart sounds: Normal heart sounds. No murmur heard.   Pulmonary:     Effort: Pulmonary effort is normal. No respiratory distress.     Breath sounds: Normal breath sounds. No wheezing or rales.  Chest:     Chest wall: No tenderness.  Musculoskeletal:     Cervical back: Normal range of motion and neck supple.  Skin:    General: Skin is dry.  Neurological:     Mental Status: She is alert and oriented to person, place, and time.    BP 124/78 (BP Location: Right Arm, Patient Position: Sitting, Cuff Size: Normal)   Pulse 81   Temp 97.7 F (36.5 C) (Oral)   Resp 20   Ht '5\' 4"'$  (1.626 m)   Wt 295 lb 6.4 oz (134 kg)   LMP  (LMP Unknown)   SpO2 95%   BMI 50.71 kg/m  Wt Readings from Last 3 Encounters:  03/09/21 295 lb (133.8 kg)  03/09/21 295 lb 6.4 oz (134 kg)  11/11/20 287 lb 6.4 oz (130.4 kg)     Lab Results  Component Value Date   WBC 13.7 (H) 02/27/2019   HGB 15.9 (H) 02/27/2019   HCT 45.6 02/27/2019   PLT 361.0 02/27/2019   GLUCOSE 148 (H) 11/07/2020   CHOL 162 09/08/2020   TRIG 149 09/08/2020   HDL 56 09/08/2020   LDLDIRECT 78.3 04/16/2013   LDLCALC 81 09/08/2020   ALT 12 09/08/2020   AST 15 09/08/2020   NA 138 11/07/2020   K 4.9 11/07/2020   CL 104 11/07/2020   CREATININE 1.41 (H) 11/07/2020   BUN 28 (H) 11/07/2020   CO2 25 11/07/2020   TSH 0.81 02/27/2019   HGBA1C 6.7 (H) 11/07/2020   MICROALBUR 3.6 (H) 10/22/2019    DG Bone Density  Result Date: 09/15/2018 EXAM: DUAL X-RAY ABSORPTIOMETRY (DXA) FOR BONE MINERAL DENSITY IMPRESSION: Alferd [facility] [place] [facility] Your patient Katelyn Mahoney completed a BMD test on 09/15/2018 using the  [employer] (analysis version: 16.SP2) manufactured by [employer]. The following summarizes the results of our evaluation. PATIENT: Name: Katelyn Mahoney Patient ID: [id number] Birth Date: 1934-06-11 Height: 64.5 in. Gender: Female Measured: 09/15/2018 Weight: 277.6 lbs. Indications: Advanced Age, Caucasian, Diabetic, Estrogen Deficiency, Height Loss, Hysterectomy, Low Calcium Intake, Post Menopausal Fractures: Ankle Treatments: Glipizide, HRT, Lispro, Multivitamin, Semaglutide ASSESSMENT: The BMD measured at Femur Neck Right  is 1.204 g/cm2 with a T-score of 1.2. This patient is considered normal according to [facility]) criteria. Lumbar spine was not utilized due to advanced degenerative changes. Site Region Measured Date Measured Age WHO YA BMD Classification T-score DualFemur Total Mean 09/15/2018 81.7 years Normal 2.5 1.323 g/cm2 Left Forearm Radius 33% 09/15/2018 81.7 Normal 1.8 1.031 g/cm2 World Health Organization [facility]) criteria for post-menopausal, Caucasian Women: Normal       T-score at or above -1 SD Osteopenia   T-score between -1 and -2.5 SD Osteoporosis T-score at or below -2.5 SD RECOMMENDATION:1. All patients should optimize calcium and vitamin D intake. 2. Consider FDA-approved medical therapies in postmenopausal women and men aged 57 years and older, based on the following: a. A hip or vertebral(clinical or morphometric) fracture. b. T-Score < -2.5 at the femoral neck or spine after appropriate evaluation to exclude secondary causes c. Low bone mass (T-score between -1.0 and -2.5 at the femoral neck or spine) and a 10 year probability of a hip fracture >3% or a 10 year probability of major osteoporosis-related fracture > 20% based on the [place]-adapted WHO algorithm d. Clinical judgement and/or patient preferences may indicate treatment for people with 10-year fracture probabilities above or below these levels FOLLOW-UP: Patients with diagnosis of osteoporosis or at  high risk for fracture should have regular bone mineral density tests. For patients eligible for Medicare, routine testing is allowed once every 2 years. The testing frequency can be increased to one year for patients who have rapidly progressing disease, those who are receiving or discontinuing medical therapy to restore bone mass, or have additional risk factors. I have reviewed this report and agree with the above findings. [facility] Electronically Signed   By: Lowella Grip III M.D.   On: 09/15/2018 09:26     Assessment & Plan:  Plan  I have discontinued Jadene Fern's diclofenac. I am also having her start on NONFORMULARY OR COMPOUNDED ITEM and triamcinolone. Additionally, I am having her maintain her aspirin, acetaminophen, (Bilberry, Vaccinium myrtillus, (BILBERRY PO)), multivitamin-lutein, Loratadine, Glucosamine HCl (GLUCOSAMINE PO), fluocinonide cream, OneTouch Delica Lancets [id number], nystatin cream, BD Pen Needle Nano U/F, insulin lispro, Toujeo SoloStar, furosemide, metoprolol succinate, sertraline, OneTouch Verio, mupirocin cream, gabapentin, Ozempic (1 MG/DOSE), atorvastatin, glipiZIDE, and amLODipine.  Meds ordered this encounter  Medications  . NONFORMULARY OR COMPOUNDED ITEM    Sig: Lift chair  #1   Dx osteoarthritis knees    Dispense:  1 each    Refill:  0  . triamcinolone (KENALOG) 0.1 %    Sig: Apply 1 application topically 2 (two) times daily.    Dispense:  30 g    Refill:  0    Problem List Items Addressed This Visit      Unprioritized   Depression    Stable       Depression, major, single episode, in partial remission ([place])   DM (diabetes mellitus) type II uncontrolled, periph vascular disorder ([place])    Per endocrine       Hyperlipidemia   Relevant Orders   Lipid panel   Comprehensive metabolic panel   Hyperlipidemia associated with type 2 diabetes mellitus ([place])    Encouraged heart healthy diet, increase exercise, avoid trans fats, consider a  krill oil cap daily      MORBID OBESITY   Primary hypertension    Well controlled, no changes to meds. Encouraged heart healthy diet such as the DASH diet and exercise as tolerated.  Relevant Orders   Comprehensive metabolic panel   CBC with Differential/Platelet   Microalbumin / creatinine urine ratio   Primary osteoarthritis of both knees - Primary   Relevant Medications   NONFORMULARY OR COMPOUNDED ITEM   Spherocytosis, hereditary ([place])    Released by hematology      Urinary frequency   Relevant Orders   POCT Urinalysis Dipstick (Automated) (Completed)   Urine Culture    Other Visit Diagnoses    Other eczema       Relevant Medications   triamcinolone (KENALOG) 0.1 %   Hereditary spherocytosis (H[facility])   (Chronic)        Follow-up: Return in about 6 months (around 09/09/2021), or if symptoms worsen or fail to improve, for fasting, annual exam.  Ann Held, DO

## 2021-03-09 NOTE — Assessment & Plan Note (Signed)
Encouraged heart healthy diet, increase exercise, avoid trans fats, consider a krill oil cap daily 

## 2021-03-09 NOTE — Progress Notes (Signed)
Subjective:   Katelyn Mahoney is a 87 y.o. female who presents for Medicare Annual (Subsequent) preventive examination.  I connected with Katelyn Mahoney today by telephone and verified that I am speaking with the correct person using two identifiers. Location patient: home Location provider: work Persons participating in the virtual visit: patient, Marine scientist.    I discussed the limitations, risks, security and privacy concerns of performing an evaluation and management service by telephone and the availability of in person appointments. I also discussed with the patient that there may be a patient responsible charge related to this service. The patient expressed understanding and verbally consented to this telephonic visit.    Interactive audio and video telecommunications were attempted between this provider and patient, however failed, due to patient having technical difficulties OR patient did not have access to video capability.  We continued and completed visit with audio only.  Some vital signs may be absent or patient reported.   Time Spent with patient on telephone encounter: 25 minutes   Review of Systems     Cardiac Risk Factors include: advanced age (>64mn, >>64women);diabetes mellitus;dyslipidemia;hypertension;obesity (BMI >30kg/m2);sedentary lifestyle     Objective:    Today's Vitals   03/09/21 1104  Weight: 295 lb (133.8 kg)  Height: '5\' 4"'$  (1.626 m)  PainSc: 7    Body mass index is 50.64 kg/m.  Advanced Directives 03/09/2021 03/01/2020 08/22/2018 01/10/2018 03/25/2017 05/03/2016 04/02/2016  Does Patient Have a Medical Advance Directive? No No Yes No No Yes Yes  Type of Advance Directive - - Living will - [place]  Does patient want to make changes to medical advance directive? - - No - Patient declined - - - No - Patient declined  [place] of [place]? - - - - - No - copy requested No - copy  requested  Would patient like information on creating a medical advance directive? Yes (MAU/Ambulatory/Procedural Areas - Information given) No - Patient declined - No - Patient declined No - Patient declined - -    Current Medications (verified) Outpatient Encounter Medications as of 03/09/2021  Medication Sig  . acetaminophen (TYLENOL) 650 MG CR tablet 2 by mouth once daily as needed  . amLODipine (NORVASC) 5 MG tablet TAKE 1 TABLET DAILY  . aspirin 81 MG tablet Take 81 mg by mouth. 3 by mouth in the evening  . atorvastatin (LIPITOR) 20 MG tablet TAKE 1/2 TABLET ON MONDAY, WEDNESDAY, FRIDAY AND      SUNDAYS  . Bilberry, Vaccinium myrtillus, (BILBERRY PO) Take 470 mg by mouth. 2 by mouth daily  . fluocinonide cream (LIDEX) 0AB-123456789% Apply 1 application topically 2 (two) times daily.  . furosemide (LASIX) 40 MG tablet TAKE 1 TABLET DAILY  . gabapentin (NEURONTIN) 100 MG capsule TAKE 1 CAPSULE BY MOUTH 3 TIMES DAILY  . glipiZIDE (GLUCOTROL XL) 10 MG 24 hr tablet TAKE 1 TABLET EVERY MORNING  . Glucosamine HCl (GLUCOSAMINE PO) Take by mouth daily.  .Marland Kitchenglucose blood (ONETOUCH VERIO) test strip USE TO TEST BLOOD SUGAR    TWICE A DAY  . insulin glargine, 1 Unit Dial, (TOUJEO SOLOSTAR) 300 UNIT/ML Solostar Pen Inject 32 Units into the skin daily. (Patient taking differently: Inject 34 Units into the skin daily.)  . insulin lispro (HUMALOG KWIKPEN) 100 UNIT/ML KwikPen Inject 7-9 units under the skin three times daily before meals.  . Insulin Pen Needle (BD PEN NEEDLE NANO U/F) 32G X 4  MM MISC USE TO INJECT INSULIN INTO THE SKIN 1-3 TIMES DAILY  . Loratadine 10 MG CAPS Take 1 capsule by mouth daily as needed.   . metoprolol succinate (TOPROL-XL) 100 MG 24 hr tablet Take 1 tablet (100 mg total) by mouth daily. Take with or immediately following a meal.  . multivitamin-lutein (OCUVITE-LUTEIN) CAPS capsule Take 1 capsule by mouth daily.  . mupirocin cream (BACTROBAN) 2 % APPLY 1 APPLICATION        TOPICALLY  TWO TIMES A DAY  . NONFORMULARY OR COMPOUNDED ITEM Lift chair  #1   Dx osteoarthritis knees  . nystatin cream (MYCOSTATIN) Apply 1 application topically 2 (two) times daily.  Glory Rosebush DELICA LANCETS 99991111 MISC Use to test blood sugar once daily- E11.21  . OZEMPIC, 1 MG/DOSE, 4 MG/3ML SOPN INJECT '1MG'$  SUBCUTANEOUSLY  ONCE A WEEK.  . sertraline (ZOLOFT) 100 MG tablet 1 tab po qd  . triamcinolone (KENALOG) 0.1 % Apply 1 application topically 2 (two) times daily.   No facility-administered encounter medications on file as of 03/09/2021.    Allergies (verified) Bupropion hcl, Penicillins, Citalopram hydrobromide, Metformin and related, Blue dyes (parenteral), Cobalt, Gold-containing drug products, and Nickel   History: Past Medical History:  Diagnosis Date  . Allergy   . Anxiety   . Arthritis   . Arthritis   . Callus   . Depression   . Diabetes mellitus without complication ([place])   . Hyperlipidemia   . Hypertension    Past Surgical History:  Procedure Laterality Date  . ABDOMINAL HYSTERECTOMY    . CHOLECYSTECTOMY    . SPLENECTOMY     Family History  Problem Relation Age of Onset  . Cancer Mother   . Diabetes Mother   . Cancer Father   . Diabetes Father   . Cancer Sister   . Diabetes Sister   . Cancer Brother   . Brain cancer Brother   . Diabetes Brother   . Diabetes Maternal Grandmother   . Diabetes Paternal Grandmother   . Cancer - Lung Brother   . Diabetes Brother    Social History   Socioeconomic History  . Marital status: Widowed    Spouse name: Not on file  . Number of children: Not on file  . Years of education: Not on file  . Highest education level: Not on file  Occupational History  . Not on file  Tobacco Use  . Smoking status: Never Smoker  . Smokeless tobacco: Never Used  Substance and Sexual Activity  . Alcohol use: No    Alcohol/week: 0.0 standard drinks  . Drug use: No  . Sexual activity: Never    Birth control/protection: Abstinence  Other  Topics Concern  . Not on file  Social History Narrative  . Not on file   Social Determinants of Health   Financial Resource Strain: Low Risk   . Difficulty of Paying Living Expenses: Not hard at all  Food Insecurity: No Food Insecurity  . Worried About Charity fundraiser in the Last Year: Never true  . Ran Out of Food in the Last Year: Never true  Transportation Needs: No Transportation Needs  . Lack of Transportation (Medical): No  . Lack of Transportation (Non-Medical): No  Physical Activity: Inactive  . Days of Exercise per Week: 0 days  . Minutes of Exercise per Session: 0 min  Stress: No Stress Concern Present  . Feeling of Stress : Not at all  Social Connections: Socially Isolated  . Frequency  of Communication with Friends and Family: More than three times a week  . Frequency of Social Gatherings with Friends and Family: More than three times a week  . Attends Religious Services: Never  . Active Member of Clubs or Organizations: No  . Attends Archivist Meetings: Never  . Marital Status: Widowed    Tobacco Counseling Counseling given: Not Answered   Clinical Intake:  Pre-visit preparation completed: Yes  Pain : 0-10 Pain Score: 7  Pain Type: Chronic pain Pain Location: Knee Pain Orientation: Right,Left Pain Onset: More than a month ago Pain Frequency: Constant     Nutritional Status: BMI > 30  Obese Nutritional Risks: None Diabetes: Yes CBG done?: No Did pt. bring in CBG monitor from home?: No (phone visit)  How often do you need to have someone help you when you read instructions, pamphlets, or other written materials from your doctor or pharmacy?: 1 - Never  Diabetes:  Is the patient diabetic?  Yes  If diabetic, was a CBG obtained today?  No  Did the patient bring in their glucometer from home?  No phone visit How often do you monitor your CBG's? daily.   Financial Strains and Diabetes Management:  Are you having any financial strains  with the device, your supplies or your medication? No .  Does the patient want to be seen by Chronic Care Management for management of their diabetes?  No  Would the patient like to be referred to a Nutritionist or for Diabetic Management?  No   Diabetic Exams:  Diabetic Eye Exam: Completed 10/24/2020.   Diabetic Foot Exam: Completed 05/05/2020.    Interpreter Needed?: No  Information entered by :: Caroleen Hamman LPN   Activities of Daily Living In your present state of health, do you have any difficulty performing the following activities: 03/09/2021 03/09/2021  Hearing? N Y  Vision? N N  Difficulty concentrating or making decisions? N N  Walking or climbing stairs? N Y  Dressing or bathing? N N  Doing errands, shopping? N Y  Conservation officer, nature and eating ? N -  Using the Toilet? N -  In the past six months, have you accidently leaked urine? Y -  Comment occasionally -  Do you have problems with loss of bowel control? N -  Managing your Medications? N -  Managing your Finances? N -  Housekeeping or managing your Housekeeping? N -  Some recent data might be hidden    Patient Care Team: Carollee Herter, Alferd Apa, DO as PCP - General (Family Medicine) Rutherford Guys, MD as Consulting Physician (Ophthalmology) Rod Can, MD as Consulting Physician (Orthopedic Surgery) System, Provider Not In as Consulting Physician (Dentistry) Jarome Matin, MD as Consulting Physician (Dermatology) Elayne Snare, MD as Consulting Physician (Endocrinology) Rexene Agent, MD as Attending Physician (Nephrology) Yaakov Guthrie, DPM as Consulting Physician (Podiatry) Yaakov Guthrie, DPM (Podiatry)  Indicate any recent Medical Services you may have received from other than [facility] providers in the past year (date may be approximate).     Assessment:   This is a routine wellness examination for [place].  Hearing/Vision screen  Hearing Screening   '125Hz'$  '250Hz'$  '500Hz'$  '1000Hz'$  '2000Hz'$  '3000Hz'$  '4000Hz'$  '6000Hz'$   '8000Hz'$   Right ear:           Left ear:           Comments: Patient states she has noticed some mild hearing loss  Vision Screening Comments: Wears glasses Last eye exam-10/24/2020-Dr. Gershon Crane  Dietary issues and exercise  activities discussed: Current Exercise Habits: The patient does not participate in regular exercise at present, Exercise limited by: None identified  Goals    . Maintain  health and independence    . Patient Stated     Lose weight & drink more water      Depression Screen PHQ 2/9 Scores 03/09/2021 03/09/2021 03/09/2021 03/01/2020 01/10/2018 03/25/2017 10/02/2016  PHQ - 2 Score 0 2 0 0 1 1 0  PHQ- 9 Score - 5 - - - - -    Fall Risk Fall Risk  03/09/2021 03/09/2021 03/01/2020 01/10/2018 03/25/2017  Falls in the past year? 0 0 0 No No  Number falls in past yr: 0 0 0 - -  Injury with Fall? 0 0 0 - -  Risk for fall due to : - - - - -  Risk for fall due to: Comment - - - - -  Follow up Falls prevention discussed Falls evaluation completed Education provided;Falls prevention discussed - -    FALL RISK PREVENTION PERTAINING TO THE HOME:  Any stairs in or around the home? Yes  If so, are there any without handrails? No  Home free of loose throw rugs in walkways, pet beds, electrical cords, etc? Yes  Adequate lighting in your home to reduce risk of falls? Yes   ASSISTIVE DEVICES UTILIZED TO PREVENT FALLS:  Life alert? No  Use of a cane, walker or w/c? Yes  Grab bars in the bathroom? Yes  Shower chair or bench in shower? No  Elevated toilet seat or a handicapped toilet? No   TIMED UP AND GO:  Was the test performed? No . Phone visit   Cognitive Function:Normal cognitive status assessed by this Nurse Health Advisor. No abnormalities found.          Immunizations Immunization History  Administered Date(s) Administered  . Fluad Quad(high Dose 65+) 09/03/2019  . Influenza Split 10/03/2011, 09/30/2012  . Influenza Whole 09/24/2008, 09/27/2009, 09/15/2010  .  Influenza, High Dose Seasonal PF 10/02/2016, 09/15/2018, 10/20/2020  . Influenza,inj,Quad PF,6+ Mos 09/24/2013, 09/09/2014, 09/20/2015, 09/23/2017  . PFIZER(Purple Top)SARS-COV-2 Vaccination 01/20/2020, 02/10/2020, 10/19/2020  . Pneumococcal Conjugate-13 11/09/2014  . Pneumococcal Polysaccharide-23 01/17/2012, 08/19/2017  . Td 04/07/2014  . Zoster 03/22/2008    TDAP status: Up to date  Flu Vaccine status: Up to date  Pneumococcal vaccine status: Up to date  Covid-19 vaccine status: Completed vaccines  Qualifies for Shingles Vaccine? Yes   Zostavax completed Yes   Shingrix Completed?: No.    Education has been provided regarding the importance of this vaccine. Patient has been advised to call insurance company to determine out of pocket expense if they have not yet received this vaccine. Advised may also receive vaccine at local pharmacy or Health Dept. Verbalized acceptance and understanding.  Screening Tests Health Maintenance  Topic Date Due  . MAMMOGRAM  07/30/2020  . FOOT EXAM  05/05/2021  . HEMOGLOBIN A1C  05/07/2021  . OPHTHALMOLOGY EXAM  10/24/2021  . TETANUS/TDAP  04/07/2024  . INFLUENZA VACCINE  Completed  . DEXA SCAN  Completed  . COVID-19 Vaccine  Completed  . PNA vac Low Risk Adult  Completed  . HPV VACCINES  Aged Out    Health Maintenance  Health Maintenance Due  Topic Date Due  . MAMMOGRAM  07/30/2020    Colorectal cancer screening: No longer required.   Mammogram status: No longer required due to patient declined.  Bone Density status: Ordered today. Pt provided with contact info and advised to  call to schedule appt.  Lung Cancer Screening: (Low Dose CT Chest recommended if Age 24-80 years, 30 pack-year currently smoking OR have quit w/in 15years.) does not qualify.    Additional Screening:  Hepatitis C Screening: does not qualify  Vision Screening: Recommended annual ophthalmology exams for early detection of glaucoma and other disorders of the  eye. Is the patient up to date with their annual eye exam?  Yes  Who is the provider or what is the name of the office in which the patient attends annual eye exams? Dr. Gershon Crane   Dental Screening: Recommended annual dental exams for proper oral hygiene  Community Resource Referral / Chronic Care Management: CRR required this visit?  No   CCM required this visit?  No      Plan:     I have personally reviewed and noted the following in the patient's chart:   . Medical and social history . Use of alcohol, tobacco or illicit drugs  . Current medications and supplements . Functional ability and status . Nutritional status . Physical activity . Advanced directives . List of other physicians . Hospitalizations, surgeries, and ER visits in previous 12 months . Vitals . Screenings to include cognitive, depression, and falls . Referrals and appointments  In addition, I have reviewed and discussed with patient certain preventive protocols, quality metrics, and best practice recommendations. A written personalized care plan for preventive services as well as general preventive health recommendations were provided to patient.   Due to this being a telephonic visit, the after visit summary with patients personalized plan was offered to patient via mail or my-chart.Patient would like to access on my-chart.  Marta Antu, LPN   [id number]  Nurse Health Advisor  Nurse Notes: None

## 2021-03-11 LAB — URINE CULTURE
MICRO NUMBER:: 11632038
SPECIMEN QUALITY:: ADEQUATE

## 2021-03-13 ENCOUNTER — Other Ambulatory Visit: Payer: Self-pay | Admitting: Family Medicine

## 2021-03-13 ENCOUNTER — Other Ambulatory Visit: Payer: Medicare Other

## 2021-03-13 DIAGNOSIS — F324 Major depressive disorder, single episode, in partial remission: Secondary | ICD-10-CM

## 2021-03-14 ENCOUNTER — Other Ambulatory Visit: Payer: Self-pay

## 2021-03-14 DIAGNOSIS — N390 Urinary tract infection, site not specified: Secondary | ICD-10-CM

## 2021-03-14 MED ORDER — CIPROFLOXACIN HCL 500 MG PO TABS
500.0000 mg | ORAL_TABLET | Freq: Two times a day (BID) | ORAL | 0 refills | Status: AC
Start: 2021-03-14 — End: 2021-03-19

## 2021-03-15 ENCOUNTER — Telehealth: Payer: Self-pay | Admitting: Endocrinology

## 2021-03-15 NOTE — Telephone Encounter (Signed)
MEDICATION: Humalog  PHARMACY:   [facility] - [place] - [address] Phone:  [phone]  Fax:  [phone]      HAS THE PATIENT CONTACTED THEIR PHARMACY?  yes  IS THIS A 90 DAY SUPPLY : yes  IS PATIENT OUT OF MEDICATION: no  IF NOT; HOW MUCH IS LEFT: 1 week worth  LAST APPOINTMENT DATE: '@2'$ /26/2022  NEXT APPOINTMENT DATE:'@5'$ /05/2021  DO WE HAVE YOUR PERMISSION TO LEAVE A DETAILED MESSAGE?:  OTHER COMMENTS:    **Let patient know to contact pharmacy at the end of the day to make sure medication is ready. **  ** Please notify patient to allow 48-72 hours to process**  **Encourage patient to contact the pharmacy for refills or they can request refills through [facility]**

## 2021-03-16 ENCOUNTER — Other Ambulatory Visit: Payer: Self-pay | Admitting: *Deleted

## 2021-03-16 DIAGNOSIS — Z794 Long term (current) use of insulin: Secondary | ICD-10-CM

## 2021-03-16 DIAGNOSIS — E1165 Type 2 diabetes mellitus with hyperglycemia: Secondary | ICD-10-CM

## 2021-03-16 MED ORDER — INSULIN LISPRO (1 UNIT DIAL) 100 UNIT/ML (KWIKPEN): PEN_INJECTOR | SUBCUTANEOUS | 0 refills | Status: DC

## 2021-03-16 NOTE — Telephone Encounter (Signed)
Notified pt sent the Rx Humalog kwikpen--[place].

## 2021-03-17 ENCOUNTER — Ambulatory Visit: Payer: Medicare Other | Admitting: Endocrinology

## 2021-03-23 ENCOUNTER — Other Ambulatory Visit: Payer: Self-pay

## 2021-03-23 ENCOUNTER — Ambulatory Visit (HOSPITAL_BASED_OUTPATIENT_CLINIC_OR_DEPARTMENT_OTHER)
Admission: RE | Admit: 2021-03-23 | Discharge: 2021-03-23 | Disposition: A | Discharge status: Home / Self Care | Payer: Medicare Other | Source: Ambulatory Visit | Attending: Family Medicine | Admitting: Family Medicine

## 2021-03-23 DIAGNOSIS — Z780 Asymptomatic menopausal state: Secondary | ICD-10-CM | POA: Diagnosis not present

## 2021-04-17 ENCOUNTER — Other Ambulatory Visit: Payer: Self-pay | Admitting: Endocrinology

## 2021-05-05 ENCOUNTER — Other Ambulatory Visit: Payer: Self-pay

## 2021-05-05 ENCOUNTER — Other Ambulatory Visit (INDEPENDENT_AMBULATORY_CARE_PROVIDER_SITE_OTHER): Payer: Medicare Other

## 2021-05-05 DIAGNOSIS — E1165 Type 2 diabetes mellitus with hyperglycemia: Secondary | ICD-10-CM

## 2021-05-05 DIAGNOSIS — Z794 Long term (current) use of insulin: Secondary | ICD-10-CM

## 2021-05-05 LAB — COMPREHENSIVE METABOLIC PANEL
ALT: 17 U/L (ref 0–35)
AST: 20 U/L (ref 0–37)
Albumin: 3.9 g/dL (ref 3.5–5.2)
Alkaline Phosphatase: 74 U/L (ref 39–117)
BUN: 27 mg/dL — ABNORMAL HIGH (ref 6–23)
CO2: 21 mEq/L (ref 19–32)
Calcium: 9.3 mg/dL (ref 8.4–10.5)
Chloride: 106 mEq/L (ref 96–112)
Creatinine, Ser: 1.19 mg/dL (ref 0.40–1.20)
GFR: 42.01 mL/min — ABNORMAL LOW (ref >60.00)
Glucose, Bld: 150 mg/dL — ABNORMAL HIGH (ref 70–99)
Potassium: 4.7 mEq/L (ref 3.5–5.1)
Sodium: 138 mEq/L (ref 135–145)
Total Bilirubin: 0.6 mg/dL (ref 0.2–1.2)
Total Protein: 7.4 g/dL (ref 6.0–8.3)

## 2021-05-05 LAB — HEMOGLOBIN A1C: Hgb A1c MFr Bld: 6.4 % (ref 4.6–6.5)

## 2021-05-05 LAB — MICROALBUMIN / CREATININE URINE RATIO
Creatinine,U: 99.4 mg/dL
Microalb Creat Ratio: 6 mg/g (ref 0.0–30.0)
Microalb, Ur: 6 mg/dL — ABNORMAL HIGH (ref 0.0–1.9)

## 2021-05-10 ENCOUNTER — Ambulatory Visit: Payer: Medicare Other | Admitting: Endocrinology

## 2021-05-10 ENCOUNTER — Other Ambulatory Visit: Payer: Self-pay | Admitting: *Deleted

## 2021-05-10 ENCOUNTER — Encounter: Payer: Self-pay | Admitting: Endocrinology

## 2021-05-10 ENCOUNTER — Other Ambulatory Visit: Payer: Self-pay

## 2021-05-10 VITALS — BP 126/82 | HR 75 | Ht 64.0 in | Wt 299.2 lb

## 2021-05-10 DIAGNOSIS — Z794 Long term (current) use of insulin: Secondary | ICD-10-CM

## 2021-05-10 DIAGNOSIS — I10 Essential (primary) hypertension: Secondary | ICD-10-CM

## 2021-05-10 DIAGNOSIS — N289 Disorder of kidney and ureter, unspecified: Secondary | ICD-10-CM | POA: Diagnosis not present

## 2021-05-10 DIAGNOSIS — E1121 Type 2 diabetes mellitus with diabetic nephropathy: Secondary | ICD-10-CM

## 2021-05-10 DIAGNOSIS — E1165 Type 2 diabetes mellitus with hyperglycemia: Secondary | ICD-10-CM

## 2021-05-10 MED ORDER — ONETOUCH DELICA LANCETS 33G MISC: 3 refills | Status: AC

## 2021-05-10 MED ORDER — INSULIN LISPRO (1 UNIT DIAL) 100 UNIT/ML (KWIKPEN): PEN_INJECTOR | SUBCUTANEOUS | 0 refills | Status: DC

## 2021-05-10 NOTE — Progress Notes (Signed)
Patient ID: Katelyn Mahoney, female   DOB: Jun 07, 1934, 87 y.o.   MRN: [id number]            Reason for Appointment: Follow-up for Type 2 Diabetes  Referring physician: Etter Sjogren   History of Present Illness:          Date of diagnosis of type 2 diabetes mellitus: ?  1983        Background history:   She had previously been on metformin with fairly good control for some time She was also at some point treated with Janumet Apparently subsequently she could not tolerate metformin and had diarrhea This was switched to glipizide about 2 years ago and she is also being treated with Januvia and Chana Bode was started in 9/17  Review of A1c indicates that her levels had been significantly high since 2016 She was initially seen in consultation on 02/27/17  Recent history:       Non-insulin hypoglycemic drugs  : Ozempic 1 mg weekly, glipizide ER 10 mg 1 x a day  INSULIN regimen: Toujeo 34 units at supper, Humalog 9 units at supper and 5 units breakfast  A1c is  6.4, improved  Current management, blood sugar patterns and problems identified:  Her medications were not changed on her last visit  Is usually not able to check readings after meals especially since she does not stay up more than an hour after dinner  She said that her blood sugars are not any higher if she waits an hour or 2 after waking up to check her sugar  Not clear why her sugars are relatively higher fasting the last week or so  However she did have 1 episode of hypoglycemia when she was very late for lunch  Otherwise not taking any insulin at lunchtime  Usually is trying to eat small portions  However still unable to lose weight  Unable to do any walking or other exercise        Side effects from medications have been: Diarrhea from metformin  Compliance with the medical regimen: Fair  Glucose monitoring:  done 1 +  times a day         Glucometer: One Touch Verio       Blood Glucose readings by  download   PRE-MEAL Fasting Lunch Dinner Bedtime Overall  Glucose range:  108-157  58-169     Mean/median:  124  125   121   POST-MEAL PC Breakfast PC Lunch PC Dinner  Glucose range:    121  Mean/median:      Previous readings:   PRE-MEAL Fasting Lunch Dinner Bedtime Overall  Glucose range:  84-156   129, 158  99, 151   Mean/median:  118     125   POST-MEAL PC Breakfast PC Lunch PC Dinner  Glucose range:   107-170   Mean/median:   129       Self-care:  Meal times are:  Breakfast is at 9-10 AM Typical meal intake: Breakfast is frequently cheese toast.  Lunch is variable or nothing.  Meals are usually chicken, salad, fish, sometimes vegetables only.  Snacks: Peanuts, yogurt                Dietician visit, most recent:1/19    Weight history: Previous range 249-305  Wt Readings from Last 3 Encounters:  05/10/21 299 lb 3.2 oz (135.7 kg)  03/09/21 295 lb (133.8 kg)  03/09/21 295 lb 6.4 oz (134 kg)    Glycemic control:  Lab Results  Component Value Date   HGBA1C 6.4 05/05/2021   HGBA1C 6.7 (H) 11/07/2020   HGBA1C 6.4 08/02/2020   Lab Results  Component Value Date   MICROALBUR 6.0 (H) 05/05/2021   LDLCALC 61 03/09/2021   CREATININE 1.19 05/05/2021   Lab Results  Component Value Date   MICRALBCREAT 6.0 05/05/2021   Lab Results  Component Value Date   FRUCTOSAMINE 301 (H) 09/19/2017   FRUCTOSAMINE 312 (H) 06/20/2017   FRUCTOSAMINE 339 (H) 03/27/2017       Allergies as of 05/10/2021      Reactions   Bupropion Hcl    Palmar rash   Penicillins    rash   Citalopram Hydrobromide Other (See Comments)   Unknown   Metformin And Related Diarrhea   Blue Dyes (parenteral) Rash   Cobalt Rash   Gold-containing Drug Products Rash   Nickel Rash      Medication List       Accurate as of May 10, 2021  9:25 AM. If you have any questions, ask your nurse or doctor.        acetaminophen 650 MG CR tablet Commonly known as: TYLENOL 2 by mouth once daily as  needed   amLODipine 5 MG tablet Commonly known as: NORVASC TAKE 1 TABLET DAILY   aspirin 81 MG tablet Take 81 mg by mouth. 3 by mouth in the evening   atorvastatin 20 MG tablet Commonly known as: LIPITOR TAKE 1/2 TABLET ON MONDAY, WEDNESDAY, FRIDAY AND      SUNDAYS   BD Pen Needle Nano U/F 32G X 4 MM Misc Generic drug: Insulin Pen Needle USE TO INJECT INSULIN INTO THE SKIN 1-3 TIMES DAILY   BILBERRY PO Take 470 mg by mouth. 2 by mouth daily   fluocinonide cream 0.05 % Commonly known as: LIDEX Apply 1 application topically 2 (two) times daily.   furosemide 40 MG tablet Commonly known as: LASIX TAKE 1 TABLET DAILY   gabapentin 100 MG capsule Commonly known as: NEURONTIN TAKE 1 CAPSULE BY MOUTH 3 TIMES DAILY   glipiZIDE 10 MG 24 hr tablet Commonly known as: GLUCOTROL XL TAKE 1 TABLET EVERY MORNING   GLUCOSAMINE PO Take by mouth daily.   insulin lispro 100 UNIT/ML KwikPen Commonly known as: HumaLOG KwikPen Inject 7-9 units under the skin three times daily before meals. E11.65   Loratadine 10 MG Caps Take 1 capsule by mouth daily as needed.   metoprolol succinate 100 MG 24 hr tablet Commonly known as: TOPROL-XL Take 1 tablet (100 mg total) by mouth daily. Take with or immediately following a meal.   multivitamin-lutein Caps capsule Take 1 capsule by mouth daily.   mupirocin cream 2 % Commonly known as: BACTROBAN APPLY 1 APPLICATION        TOPICALLY TWO TIMES A DAY   NONFORMULARY OR COMPOUNDED ITEM Lift chair  #1   Dx osteoarthritis knees   nystatin cream Commonly known as: MYCOSTATIN Apply 1 application topically 2 (two) times daily.   OneTouch Delica Lancets 99991111 Misc Use to test blood sugar once daily- E11.21   OneTouch Verio test strip Generic drug: glucose blood USE TO TEST BLOOD SUGAR    TWICE A DAY   Ozempic (1 MG/DOSE) 4 MG/3ML Sopn Generic drug: Semaglutide (1 MG/DOSE) INJECT '1MG'$  SUBCUTANEOUSLY  ONCE A WEEK.   sertraline 100 MG  tablet Commonly known as: ZOLOFT TAKE 1/2 TABLET DAILY   Toujeo SoloStar 300 UNIT/ML Solostar Pen Generic drug: insulin glargine (1 Unit Dial)  Inject 34 Units into the skin daily.   triamcinolone cream 0.1 % Commonly known as: KENALOG Apply 1 application topically 2 (two) times daily.       Allergies:  Allergies  Allergen Reactions  . Bupropion Hcl     Palmar rash  . Penicillins     rash  . Citalopram Hydrobromide Other (See Comments)    Unknown  . Metformin And Related Diarrhea  . Blue Dyes (Parenteral) Rash  . Cobalt Rash  . Gold-Containing Drug Products Rash  . Nickel Rash    Past Medical History:  Diagnosis Date  . Allergy   . Anxiety   . Arthritis   . Arthritis   . Callus   . Depression   . Diabetes mellitus without complication ([place])   . Hyperlipidemia   . Hypertension     Past Surgical History:  Procedure Laterality Date  . ABDOMINAL HYSTERECTOMY    . CHOLECYSTECTOMY    . SPLENECTOMY      Family History  Problem Relation Age of Onset  . Cancer Mother   . Diabetes Mother   . Cancer Father   . Diabetes Father   . Cancer Sister   . Diabetes Sister   . Cancer Brother   . Brain cancer Brother   . Diabetes Brother   . Diabetes Maternal Grandmother   . Diabetes Paternal Grandmother   . Cancer - Lung Brother   . Diabetes Brother     Social History:  reports that she has never smoked. She has never used smokeless tobacco. She reports that she does not drink alcohol and does not use drugs.   Review of Systems   Lipid history: LDL controlled with 20 mg Lipitor    Lab Results  Component Value Date   CHOL 143 03/09/2021   HDL 58.80 03/09/2021   LDLCALC 61 03/09/2021   LDLDIRECT 78.3 04/16/2013   TRIG 115.0 03/09/2021   CHOLHDL 2 03/09/2021           Hypertension: Controlled on  Metoprolol and amlodipine. Lasix taken only occasionally  BP Readings from Last 3 Encounters:  05/10/21 126/82  03/09/21 124/78  11/11/20 130/84      Home BP readings recently [id number] systolic  CKD: Mild and has somewhat variable levels  This is followed by nephrologist periodically  History of HYPERKALEMIA: Potassium is normal consistently now   Lab Results  Component Value Date   CREATININE 1.19 05/05/2021   CREATININE 1.41 (H) 03/09/2021   CREATININE 1.41 (H) 11/07/2020   CREATININE 1.34 (H) 09/08/2020   Lab Results  Component Value Date   K 4.7 05/05/2021     Eye exam last 10/2019  NEUROPATHY: She does have peripheral neuropathy with numbness  Most recent foot exam 5/21  She is taking low-dose gabapentin for sharp pains in the left toes started in 5/21  LABS:  Lab on 05/05/2021  Component Date Value Ref Range Status  . Microalb, Ur 05/05/2021 6.0* 0.0 - 1.9 mg/dL Final  . Creatinine,U 05/05/2021 99.4  mg/dL Final  . Microalb Creat Ratio 05/05/2021 6.0  0.0 - 30.0 mg/g Final  . Sodium 05/05/2021 138  135 - 145 mEq/L Final  . Potassium 05/05/2021 4.7  3.5 - 5.1 mEq/L Final  . Chloride 05/05/2021 106  96 - 112 mEq/L Final  . CO2 05/05/2021 21  19 - 32 mEq/L Final  . Glucose, Bld 05/05/2021 150* 70 - 99 mg/dL Final  . BUN 05/05/2021 27* 6 - 23 mg/dL Final  .  Creatinine, Ser 05/05/2021 1.19  0.40 - 1.20 mg/dL Final  . Total Bilirubin 05/05/2021 0.6  0.2 - 1.2 mg/dL Final  . Alkaline Phosphatase 05/05/2021 74  39 - 117 U/L Final  . AST 05/05/2021 20  0 - 37 U/L Final  . ALT 05/05/2021 17  0 - 35 U/L Final  . Total Protein 05/05/2021 7.4  6.0 - 8.3 g/dL Final  . Albumin 05/05/2021 3.9  3.5 - 5.2 g/dL Final  . GFR 05/05/2021 42.01* >60.00 mL/min Final   Calculated using the CKD-EPI Creatinine Equation (2021)  . Calcium 05/05/2021 9.3  8.4 - 10.5 mg/dL Final  . Hgb A1c MFr Bld 05/05/2021 6.4  4.6 - 6.5 % Final   Glycemic Control Guidelines for People with Diabetes:Non Diabetic:  <6%Goal of Therapy: <7%Additional Action Suggested:  >8%     Physical Examination:  BP 126/82   Pulse 75   Ht '5\' 4"'$  (1.626  m)   Wt 299 lb 3.2 oz (135.7 kg)   LMP  (LMP Unknown)   SpO2 93%   BMI 51.36 kg/m     ASSESSMENT:  Diabetes type 2, with morbid obesity, insulin requiring  See history of present illness for detailed discussion of current diabetes management, blood sugar patterns and problems identified   She is on basal bolus insulin regimen, glipizide and Ozempic  A1c is excellent at 6.4 Home blood sugars are also averaging about 120 although not checking after meals usually Since she has tendency to low normal or low sugars if she is missing or late for lunch may benefit from stopping glipizide  Hypertension: Well-controlled and she can continue the same regimen  CKD: Her creatinine appears to be improved, however not clear what is normally causing her high creatinine Microalbumin ratio normal, explained to her that absolute microalbumin level is not used for diagnostic purposes and only the ratio  PLAN:   Stop glipizide If she starts having high readings consistently in the morning and especially after dinner will consider doing low-dose Amaryl at dinnertime However if she is finding her sugars to be consistently high after supper only she will increase her Humalog at that time Reminded her to check readings sometimes after breakfast also because of her variable carbohydrate intake in the morning She also does need to consider chair exercises to increase activity level and promote weight loss  Discussed blood sugar targets both fasting and after meals Currently may be having some dawn phenomenon Also may consider CGM  Reminded her to minimize use of Lasix to prevent any decrease in blood pressure or renal blood flow  Follow-up in 4 months  There are no Patient Instructions on file for this visit.   Elayne Snare 05/10/2021, 9:25 AM   Note: This office note was prepared with Dragon voice recognition system technology. Any transcriptional errors that result from this process are  unintentional.

## 2021-05-10 NOTE — Patient Instructions (Addendum)
Stop Glipizide, call if sugar up  Check blood sugars on waking up 3-4 days a week  Also check blood sugars about 2 hours after meals and do this after different meals by rotation  Recommended blood sugar levels on waking up are 90-130 and about 2 hours after meal is 130-160  Please bring your blood sugar monitor to each visit, thank you  Take Humalog just before meals

## 2021-06-05 MED ORDER — EASY COMFORT PEN NEEDLES 32G X 4 MM MISC: 5 refills | Status: DC

## 2021-06-11 ENCOUNTER — Encounter: Payer: Self-pay | Admitting: Family Medicine

## 2021-06-27 ENCOUNTER — Other Ambulatory Visit: Payer: Self-pay | Admitting: Endocrinology

## 2021-07-30 ENCOUNTER — Other Ambulatory Visit: Payer: Self-pay | Admitting: Endocrinology

## 2021-08-08 ENCOUNTER — Other Ambulatory Visit: Payer: Self-pay

## 2021-08-08 ENCOUNTER — Other Ambulatory Visit (INDEPENDENT_AMBULATORY_CARE_PROVIDER_SITE_OTHER): Payer: Medicare Other

## 2021-08-08 DIAGNOSIS — Z794 Long term (current) use of insulin: Secondary | ICD-10-CM | POA: Diagnosis not present

## 2021-08-08 DIAGNOSIS — E1165 Type 2 diabetes mellitus with hyperglycemia: Secondary | ICD-10-CM | POA: Diagnosis not present

## 2021-08-08 LAB — BASIC METABOLIC PANEL
BUN: 25 mg/dL — ABNORMAL HIGH (ref 6–23)
CO2: 23 mEq/L (ref 19–32)
Calcium: 9 mg/dL (ref 8.4–10.5)
Chloride: 106 mEq/L (ref 96–112)
Creatinine, Ser: 1.26 mg/dL — ABNORMAL HIGH (ref 0.40–1.20)
GFR: 39.16 mL/min — ABNORMAL LOW (ref >60.00)
Glucose, Bld: 126 mg/dL — ABNORMAL HIGH (ref 70–99)
Potassium: 4.9 mEq/L (ref 3.5–5.1)
Sodium: 138 mEq/L (ref 135–145)

## 2021-08-08 LAB — HEMOGLOBIN A1C: Hgb A1c MFr Bld: 6.8 % — ABNORMAL HIGH (ref 4.6–6.5)

## 2021-08-10 ENCOUNTER — Other Ambulatory Visit: Payer: Self-pay

## 2021-08-10 ENCOUNTER — Encounter: Payer: Self-pay | Admitting: Endocrinology

## 2021-08-10 ENCOUNTER — Ambulatory Visit: Payer: Medicare Other | Admitting: Endocrinology

## 2021-08-10 VITALS — BP 132/72 | HR 77 | Ht 69.0 in | Wt 300.8 lb

## 2021-08-10 DIAGNOSIS — Z794 Long term (current) use of insulin: Secondary | ICD-10-CM

## 2021-08-10 DIAGNOSIS — E1165 Type 2 diabetes mellitus with hyperglycemia: Secondary | ICD-10-CM | POA: Diagnosis not present

## 2021-08-10 DIAGNOSIS — I10 Essential (primary) hypertension: Secondary | ICD-10-CM | POA: Diagnosis not present

## 2021-08-10 DIAGNOSIS — E1142 Type 2 diabetes mellitus with diabetic polyneuropathy: Secondary | ICD-10-CM | POA: Diagnosis not present

## 2021-08-10 MED ORDER — INSULIN PEN NEEDLE 32G X 4 MM MISC: 2 refills | Status: AC

## 2021-08-10 NOTE — Progress Notes (Signed)
Patient ID: Katelyn Mahoney, female   DOB: 1934/11/03, 87 y.o.   MRN: [id number]            Reason for Appointment: Follow-up for Type 2 Diabetes  Referring physician: Etter Sjogren   History of Present Illness:          Date of diagnosis of type 2 diabetes mellitus: ?  1983        Background history:   She had previously been on metformin with fairly good control for some time She was also at some point treated with Janumet Apparently subsequently she could not tolerate metformin and had diarrhea This was switched to glipizide about 2 years ago and she is also being treated with Januvia and Chana Bode was started in 9/17  Review of A1c indicates that her levels had been significantly high since 2016 She was initially seen in consultation on 02/27/17  Recent history:       Non-insulin hypoglycemic drugs  : Ozempic 1 mg weekly, glipizide  INSULIN regimen: Toujeo 34 units at supper, Humalog 9 units at lunch and supper and 7 units breakfast  A1c is relatively higher at 6.8 compared to 6.4   Current management, blood sugar patterns and problems identified: Her glipizide was supposed to be stopped on the last visit but she did not do so  She is now having relatively high readings including at least 1 reading over 200 after breakfast  This is from forgetting to take her Humalog before eating in the morning periodically  She is mostly eating 3 meals a day now and usually taking 9 units at lunch also were not changed on her last visit Overall has monitored blood sugars very infrequently Morning sugars appear to be fairly good usually She does not think she has been usually found hypoglycemia Unable to do any  exercise        Side effects from medications have been: Diarrhea from metformin  Compliance with the medical regimen: Fair  Glucose monitoring:  done 1 +  times a day         Glucometer: One Touch Verio       Blood Glucose readings by download   PRE-MEAL Fasting Lunch  Dinner Bedtime Overall  Glucose range: 88-145   119 85-227  Mean/median:  160   145   POST-MEAL PC Breakfast PC Lunch PC Dinner  Glucose range:  186 185  Mean/median:      Previously:  PRE-MEAL Fasting Lunch Dinner Bedtime Overall  Glucose range:  108-157  58-169     Mean/median:  124  125   121   POST-MEAL PC Breakfast PC Lunch PC Dinner  Glucose range:    121  Mean/median:       Self-care:  Meal times are:  Breakfast is at 9-10 AM Typical meal intake: Breakfast is frequently cheese toast.  Lunch is variable or nothing.  Meals are usually chicken, salad, fish, sometimes vegetables only.  Snacks: Peanuts, yogurt                Dietician visit, most recent:1/19    Weight history: Previous range 249-305  Wt Readings from Last 3 Encounters:  08/10/21 (!) 300 lb 12.8 oz (136.4 kg)  05/10/21 299 lb 3.2 oz (135.7 kg)  03/09/21 295 lb (133.8 kg)    Glycemic control:   Lab Results  Component Value Date   HGBA1C 6.8 (H) 08/08/2021   HGBA1C 6.4 05/05/2021   HGBA1C 6.7 (H) 11/07/2020   Lab Results  Component Value Date   MICROALBUR 6.0 (H) 05/05/2021   LDLCALC 61 03/09/2021   CREATININE 1.26 (H) 08/08/2021   Lab Results  Component Value Date   MICRALBCREAT 6.0 05/05/2021   Lab Results  Component Value Date   FRUCTOSAMINE 301 (H) 09/19/2017   FRUCTOSAMINE 312 (H) 06/20/2017   FRUCTOSAMINE 339 (H) 03/27/2017       Allergies as of 08/10/2021       Reactions   Bupropion Hcl    Palmar rash   Penicillins    rash   Citalopram Hydrobromide Other (See Comments)   Unknown   Metformin And Related Diarrhea   Blue Dyes (parenteral) Rash   Cobalt Rash   Gold-containing Drug Products Rash   Nickel Rash        Medication List        Accurate as of August 10, 2021  9:23 AM. If you have any questions, ask your nurse or doctor.          acetaminophen 650 MG CR tablet Commonly known as: TYLENOL 2 by mouth once daily as needed   amLODipine 5 MG  tablet Commonly known as: NORVASC TAKE 1 TABLET DAILY   aspirin 81 MG tablet Take 81 mg by mouth. 3 by mouth in the evening   atorvastatin 20 MG tablet Commonly known as: LIPITOR TAKE 1/2 TABLET ON MONDAY, WEDNESDAY, FRIDAY AND      SUNDAYS   BILBERRY PO Take 470 mg by mouth. 2 by mouth daily   Easy Comfort Pen Needles 32G X 4 MM Misc Generic drug: Insulin Pen Needle USE TO INJECT INSULIN INTO THE SKIN 1-3 TIMES DAILY   fluocinonide cream 0.05 % Commonly known as: LIDEX Apply 1 application topically 2 (two) times daily.   furosemide 40 MG tablet Commonly known as: LASIX TAKE 1 TABLET DAILY   gabapentin 100 MG capsule Commonly known as: NEURONTIN TAKE 1 CAPSULE BY MOUTH 3 TIMES DAILY   glipiZIDE 10 MG 24 hr tablet Commonly known as: GLUCOTROL XL TAKE 1 TABLET EVERY MORNING   GLUCOSAMINE PO Take by mouth daily.   insulin lispro 100 UNIT/ML KwikPen Commonly known as: HumaLOG KwikPen Inject 7-9 units under the skin three times daily before meals. E11.65   Loratadine 10 MG Caps Take 1 capsule by mouth daily as needed.   metoprolol succinate 100 MG 24 hr tablet Commonly known as: TOPROL-XL Take 1 tablet (100 mg total) by mouth daily. Take with or immediately following a meal.   multivitamin-lutein Caps capsule Take 1 capsule by mouth daily.   mupirocin cream 2 % Commonly known as: BACTROBAN APPLY 1 APPLICATION        TOPICALLY TWO TIMES A DAY   NONFORMULARY OR COMPOUNDED ITEM Lift chair  #1   Dx osteoarthritis knees   nystatin cream Commonly known as: MYCOSTATIN Apply 1 application topically 2 (two) times daily.   OneTouch Delica Lancets 99991111 Misc Use to test blood sugar once daily- E11.65   OneTouch Verio test strip Generic drug: glucose blood USE TO TEST BLOOD SUGAR    TWICE A DAY   Ozempic (1 MG/DOSE) 4 MG/3ML Sopn Generic drug: Semaglutide (1 MG/DOSE) INJECT '1MG'$  SUBCUTANEOUSLY  ONCE A WEEK.   sertraline 100 MG tablet Commonly known as:  ZOLOFT TAKE 1/2 TABLET DAILY   Toujeo SoloStar 300 UNIT/ML Solostar Pen Generic drug: insulin glargine (1 Unit Dial) Inject 34 Units into the skin daily.   triamcinolone cream 0.1 % Commonly known as: KENALOG Apply 1 application topically  2 (two) times daily.        Allergies:  Allergies  Allergen Reactions   Bupropion Hcl     Palmar rash   Penicillins     rash   Citalopram Hydrobromide Other (See Comments)    Unknown   Metformin And Related Diarrhea   Blue Dyes (Parenteral) Rash   Cobalt Rash   Gold-Containing Drug Products Rash   Nickel Rash    Past Medical History:  Diagnosis Date   Allergy    Anxiety    Arthritis    Arthritis    Callus    Depression    Diabetes mellitus without complication ([place])    Hyperlipidemia    Hypertension     Past Surgical History:  Procedure Laterality Date   ABDOMINAL HYSTERECTOMY     CHOLECYSTECTOMY     SPLENECTOMY      Family History  Problem Relation Age of Onset   Cancer Mother    Diabetes Mother    Cancer Father    Diabetes Father    Cancer Sister    Diabetes Sister    Cancer Brother    Brain cancer Brother    Diabetes Brother    Diabetes Maternal Grandmother    Diabetes Paternal Grandmother    Cancer - Lung Brother    Diabetes Brother     Social History:  reports that she has never smoked. She has never used smokeless tobacco. She reports that she does not drink alcohol and does not use drugs.   Review of Systems   Lipid history: LDL controlled with 20 mg Lipitor prescribed by her PCP    Lab Results  Component Value Date   CHOL 143 03/09/2021   HDL 58.80 03/09/2021   LDLCALC 61 03/09/2021   LDLDIRECT 78.3 04/16/2013   TRIG 115.0 03/09/2021   CHOLHDL 2 03/09/2021           Hypertension: Controlled on  Metoprolol and amlodipine.  Lasix taken only occasionally  BP Readings from Last 3 Encounters:  08/10/21 132/72  05/10/21 126/82  03/09/21 124/78     CKD: Recently stable  This is  followed by nephrologist at times  History of HYPERKALEMIA: Potassium is normal consistently    Lab Results  Component Value Date   CREATININE 1.26 (H) 08/08/2021   CREATININE 1.19 05/05/2021   CREATININE 1.41 (H) 03/09/2021   CREATININE 1.41 (H) 11/07/2020   Lab Results  Component Value Date   K 4.9 08/08/2021     Eye exam last 10/2019  NEUROPATHY: She does have peripheral neuropathy with numbness  Most recent foot exam 5/21  She is taking  gabapentin, previously had sharp pains in the left toes, started in 5/21  LABS:  Lab on 08/08/2021  Component Date Value Ref Range Status   Sodium 08/08/2021 138  135 - 145 mEq/L Final   Potassium 08/08/2021 4.9  3.5 - 5.1 mEq/L Final   Chloride 08/08/2021 106  96 - 112 mEq/L Final   CO2 08/08/2021 23  19 - 32 mEq/L Final   Glucose, Bld 08/08/2021 126 (A) 70 - 99 mg/dL Final   BUN 08/08/2021 25 (A) 6 - 23 mg/dL Final   Creatinine, Ser 08/08/2021 1.26 (A) 0.40 - 1.20 mg/dL Final   GFR 08/08/2021 39.16 (A) >60.00 mL/min Final   Calculated using the CKD-EPI Creatinine Equation (2021)   Calcium 08/08/2021 9.0  8.4 - 10.5 mg/dL Final   Hgb A1c MFr Bld 08/08/2021 6.8 (A) 4.6 - 6.5 % Final  Glycemic Control Guidelines for People with Diabetes:Non Diabetic:  <6%Goal of Therapy: <7%Additional Action Suggested:  >8%     Physical Examination:  BP 132/72   Pulse 77   Ht '5\' 9"'$  (1.753 m)   Wt (!) 300 lb 12.8 oz (136.4 kg)   LMP  (LMP Unknown)   SpO2 97%   BMI 44.42 kg/m     ASSESSMENT:  Diabetes type 2, with morbid obesity, insulin requiring  See history of present illness for detailed discussion of current diabetes management, blood sugar patterns and problems identified   She is on basal bolus insulin regimen, glipizide and Ozempic  A1c is really higher at 6.8  She is not checking blood sugars much but does have some variability Most of her high readings are from missing her insulin doses before meals especially  breakfast However some of the high readings later in the day may be related to variable diet Fasting readings are fairly good and she thinks she is consistent with her Toujeo at dinnertime regularly   Hypertension: Well-controlled   CKD: Her creatinine is high normal  NEUROPATHY: No worsening of symptoms  PLAN:   Stop glipizide however unlikely since she is benefiting from this with her long duration of diabetes She will let us know if her blood sugars are unusually high or  She will try to do more consistently injections of her Humalog before meals Discussed need for more consistent monitoring to help her with compliance as well as alerting her for any high or low sugars She is not very keen on starting the CGM at this time even though it was explained how the freestyle libre sensor would work She will think about it and let us know  Also she will likely benefit from going up to 2 mg of Ozempic but since she just has received a 54-monthsupply we will have her finish the 1 mg dose first  Follow-up in 4 months  There are no Patient Instructions on file for this visit.   AElayne Snare8/10/2021, 9:23 AM   Note: This office note was prepared with Dragon voice recognition system technology. Any transcriptional errors that result from this process are unintentional.

## 2021-08-10 NOTE — Patient Instructions (Addendum)
Stop Glipizide  Will need to change Ozempic to '2mg'$  next  Check blood sugars on waking up 3 days a week  Also check blood sugars about 2 hours after meals and do this after different meals by rotation  Recommended blood sugar levels on waking up are 90-130 and about 2 hours after meal is 130-180  Please bring your blood sugar monitor to each visit, thank you

## 2021-08-21 ENCOUNTER — Other Ambulatory Visit: Payer: Self-pay | Admitting: Family Medicine

## 2021-08-21 ENCOUNTER — Other Ambulatory Visit: Payer: Self-pay | Admitting: Endocrinology

## 2021-08-21 DIAGNOSIS — I10 Essential (primary) hypertension: Secondary | ICD-10-CM

## 2021-08-21 DIAGNOSIS — E785 Hyperlipidemia, unspecified: Secondary | ICD-10-CM

## 2021-08-21 DIAGNOSIS — E1121 Type 2 diabetes mellitus with diabetic nephropathy: Secondary | ICD-10-CM

## 2021-08-21 DIAGNOSIS — E1151 Type 2 diabetes mellitus with diabetic peripheral angiopathy without gangrene: Secondary | ICD-10-CM

## 2021-08-21 DIAGNOSIS — IMO0002 Reserved for concepts with insufficient information to code with codable children: Secondary | ICD-10-CM

## 2021-08-22 ENCOUNTER — Encounter: Payer: Self-pay | Admitting: Family Medicine

## 2021-09-11 ENCOUNTER — Other Ambulatory Visit: Payer: Self-pay

## 2021-09-11 ENCOUNTER — Encounter: Payer: Self-pay | Admitting: Family Medicine

## 2021-09-11 ENCOUNTER — Ambulatory Visit (INDEPENDENT_AMBULATORY_CARE_PROVIDER_SITE_OTHER): Payer: Medicare Other | Admitting: Family Medicine

## 2021-09-11 VITALS — BP 130/80 | HR 84 | Temp 98.7°F | Resp 20 | Ht 69.0 in | Wt 290.4 lb

## 2021-09-11 DIAGNOSIS — Z0000 Encounter for general adult medical examination without abnormal findings: Secondary | ICD-10-CM

## 2021-09-11 DIAGNOSIS — E1165 Type 2 diabetes mellitus with hyperglycemia: Secondary | ICD-10-CM | POA: Diagnosis not present

## 2021-09-11 DIAGNOSIS — F324 Major depressive disorder, single episode, in partial remission: Secondary | ICD-10-CM

## 2021-09-11 DIAGNOSIS — I10 Essential (primary) hypertension: Secondary | ICD-10-CM

## 2021-09-11 DIAGNOSIS — Z23 Encounter for immunization: Secondary | ICD-10-CM | POA: Diagnosis not present

## 2021-09-11 LAB — COMPREHENSIVE METABOLIC PANEL
ALT: 16 U/L (ref 0–35)
AST: 14 U/L (ref 0–37)
Albumin: 3.8 g/dL (ref 3.5–5.2)
Alkaline Phosphatase: 70 U/L (ref 39–117)
BUN: 28 mg/dL — ABNORMAL HIGH (ref 6–23)
CO2: 23 mEq/L (ref 19–32)
Calcium: 9.9 mg/dL (ref 8.4–10.5)
Chloride: 108 mEq/L (ref 96–112)
Creatinine, Ser: 1.38 mg/dL — ABNORMAL HIGH (ref 0.40–1.20)
GFR: 35.08 mL/min — ABNORMAL LOW (ref >60.00)
Glucose, Bld: 171 mg/dL — ABNORMAL HIGH (ref 70–99)
Potassium: 5.3 mEq/L — ABNORMAL HIGH (ref 3.5–5.1)
Sodium: 141 mEq/L (ref 135–145)
Total Bilirubin: 0.7 mg/dL (ref 0.2–1.2)
Total Protein: 6.9 g/dL (ref 6.0–8.3)

## 2021-09-11 LAB — LIPID PANEL
Cholesterol: 161 mg/dL (ref 0–200)
HDL: 66.8 mg/dL (ref >39.00)
LDL Cholesterol: 68 mg/dL (ref 0–99)
NonHDL: 94.16
Total CHOL/HDL Ratio: 2
Triglycerides: 130 mg/dL (ref 0.0–149.0)
VLDL: 26 mg/dL (ref 0.0–40.0)

## 2021-09-11 LAB — CBC WITH DIFFERENTIAL/PLATELET
Basophils Absolute: 0.1 10*3/uL (ref 0.0–0.1)
Basophils Relative: 0.7 % (ref 0.0–3.0)
Eosinophils Absolute: 0.3 10*3/uL (ref 0.0–0.7)
Eosinophils Relative: 2.2 % (ref 0.0–5.0)
HCT: 44.8 % (ref 36.0–46.0)
Hemoglobin: 14.8 g/dL (ref 12.0–15.0)
Lymphocytes Relative: 17.8 % (ref 12.0–46.0)
Lymphs Abs: 2.3 10*3/uL (ref 0.7–4.0)
MCHC: 33.1 g/dL (ref 30.0–36.0)
MCV: 99.4 fl (ref 78.0–100.0)
Monocytes Absolute: 1.3 10*3/uL — ABNORMAL HIGH (ref 0.1–1.0)
Monocytes Relative: 10.4 % (ref 3.0–12.0)
Neutro Abs: 8.8 10*3/uL — ABNORMAL HIGH (ref 1.4–7.7)
Neutrophils Relative %: 68.9 % (ref 43.0–77.0)
Platelets: 303 10*3/uL (ref 150.0–400.0)
RBC: 4.5 Mil/uL (ref 3.87–5.11)
RDW: 16.2 % — ABNORMAL HIGH (ref 11.5–15.5)
WBC: 12.8 10*3/uL — ABNORMAL HIGH (ref 4.0–10.5)

## 2021-09-11 MED ORDER — LISINOPRIL 10 MG PO TABS: 10.0000 mg | ORAL_TABLET | Freq: Every day | ORAL | 3 refills | Status: DC

## 2021-09-11 MED ORDER — SERTRALINE HCL 100 MG PO TABS: ORAL_TABLET | ORAL | 3 refills | Status: AC

## 2021-09-11 NOTE — Patient Instructions (Signed)
Preventive Care 85 Years and Older, Female Preventive care refers to lifestyle choices and visits with your health care provider that can promote health and wellness. This includes: A yearly physical exam. This is also called an annual wellness visit. Regular dental and eye exams. Immunizations. Screening for certain conditions. Healthy lifestyle choices, such as: Eating a healthy diet. Getting regular exercise. Not using drugs or products that contain nicotine and tobacco. Limiting alcohol use. What can I expect for my preventive care visit? Physical exam Your health care provider will check your: Height and weight. These may be used to calculate your BMI (body mass index). BMI is a measurement that tells if you are at a healthy weight. Heart rate and blood pressure. Body temperature. Skin for abnormal spots. Counseling Your health care provider may ask you questions about your: Past medical problems. Family's medical history. Alcohol, tobacco, and drug use. Emotional well-being. Home life and relationship well-being. Sexual activity. Diet, exercise, and sleep habits. History of falls. Memory and ability to understand (cognition). Work and work Statistician. Pregnancy and menstrual history. Access to firearms. What immunizations do I need? Vaccines are usually given at various ages, according to a schedule. Your health care provider will recommend vaccines for you based on your age, medical history, and lifestyle or other factors, such as travel or where you work. What tests do I need? Blood tests Lipid and cholesterol levels. These may be checked every 5 years, or more often depending on your overall health. Hepatitis C test. Hepatitis B test. Screening Lung cancer screening. You may have this screening every year starting at age 85 if you have a 30-pack-year history of smoking and currently smoke or have quit within the past 15 years. Colorectal cancer screening. All  adults should have this screening starting at age 85 and continuing until age 85. Your health care provider may recommend screening at age 85 if you are at increased risk. You will have tests every 1-10 years, depending on your results and the type of screening test. Diabetes screening. This is done by checking your blood sugar (glucose) after you have not eaten for a while (fasting). You may have this done every 1-3 years. Mammogram. This may be done every 1-2 years. Talk with your health care provider about how often you should have regular mammograms. Abdominal aortic aneurysm (AAA) screening. You may need this if you are a current or former smoker. BRCA-related cancer screening. This may be done if you have a family history of breast, ovarian, tubal, or peritoneal cancers. Other tests STD (sexually transmitted disease) testing, if you are at risk. Bone density scan. This is done to screen for osteoporosis. You may have this done starting at age 85. Talk with your health care provider about your test results, treatment options, and if necessary, the need for more tests. Follow these instructions at home: Eating and drinking  Eat a diet that includes fresh fruits and vegetables, whole grains, lean protein, and low-fat dairy products. Limit your intake of foods with high amounts of sugar, saturated fats, and salt. Take vitamin and mineral supplements as recommended by your health care provider. Do not drink alcohol if your health care provider tells you not to drink. If you drink alcohol: Limit how much you have to 0-1 drink a day. Be aware of how much alcohol is in your drink. In the [place] one drink equals one 12 oz bottle of beer (355 mL), one 5 oz glass of wine (148 mL), or one  1 oz glass of hard liquor (44 mL). Lifestyle Take daily care of your teeth and gums. Brush your teeth every morning and night with fluoride toothpaste. Floss one time each day. Stay active. Exercise for at least  30 minutes 5 or more days each week. Do not use any products that contain nicotine or tobacco, such as cigarettes, e-cigarettes, and chewing tobacco. If you need help quitting, ask your health care provider. Do not use drugs. If you are sexually active, practice safe sex. Use a condom or other form of protection in order to prevent STIs (sexually transmitted infections). Talk with your health care provider about taking a low-dose aspirin or statin. Find healthy ways to cope with stress, such as: Meditation, yoga, or listening to music. Journaling. Talking to a trusted person. Spending time with friends and family. Safety Always wear your seat belt while driving or riding in a vehicle. Do not drive: If you have been drinking alcohol. Do not ride with someone who has been drinking. When you are tired or distracted. While texting. Wear a helmet and other protective equipment during sports activities. If you have firearms in your house, make sure you follow all gun safety procedures. What's next? Visit your health care provider once a year for an annual wellness visit. Ask your health care provider how often you should have your eyes and teeth checked. Stay up to date on all vaccines. This information is not intended to replace advice given to you by your health care provider. Make sure you discuss any questions you have with your health care provider. Document Revised: 02/24/2021 Document Reviewed: 12/11/2018 Elsevier Patient Education  2022 [employer].

## 2021-09-11 NOTE — Progress Notes (Signed)
Subjective:     Katelyn Mahoney is a 87 y.o. female and is here for a comprehensive physical exam. The patient reports problems - increase stress because her daughter and her husband moved in and they fight all the time  . She would like to increase the zoloft to 1 pill instead of a half No other complaints  Social History   Socioeconomic History   Marital status: Widowed    Spouse name: Not on file   Number of children: Not on file   Years of education: Not on file   Highest education level: Not on file  Occupational History   Not on file  Tobacco Use   Smoking status: Never   Smokeless tobacco: Never  Substance and Sexual Activity   Alcohol use: No    Alcohol/week: 0.0 standard drinks   Drug use: No   Sexual activity: Never    Birth control/protection: Abstinence  Other Topics Concern   Not on file  Social History Narrative   Not on file   Social Determinants of Health   Financial Resource Strain: Low Risk    Difficulty of Paying Living Expenses: Not hard at all  Food Insecurity: No Food Insecurity   Worried About Charity fundraiser in the Last Year: Never true   [place] in the Last Year: Never true  Transportation Needs: No Transportation Needs   Lack of Transportation (Medical): No   Lack of Transportation (Non-Medical): No  Physical Activity: Inactive   Days of Exercise per Week: 0 days   Minutes of Exercise per Session: 0 min  Stress: No Stress Concern Present   Feeling of Stress : Not at all  Social Connections: Socially Isolated   Frequency of Communication with Friends and Family: More than three times a week   Frequency of Social Gatherings with Friends and Family: More than three times a week   Attends Religious Services: Never   Marine scientist or Organizations: No   Attends Archivist Meetings: Never   Marital Status: Widowed  Intimate Partner Violence: Not At Risk   Fear of Current or Ex-Partner: No   Emotionally Abused: No    Physically Abused: No   Sexually Abused: No   Health Maintenance  Topic Date Due   Zoster Vaccines- Shingrix (1 of 2) Never done   MAMMOGRAM  07/30/2020   COVID-19 Vaccine (4 - Booster for [employer] series) 02/19/2021   FOOT EXAM  05/05/2021   INFLUENZA VACCINE  07/31/2021   OPHTHALMOLOGY EXAM  10/24/2021   HEMOGLOBIN A1C  02/08/2022   TETANUS/TDAP  04/07/2024   DEXA SCAN  Completed   PNA vac Low Risk Adult  Completed   HPV VACCINES  Aged Out    The following portions of the patient's history were reviewed and updated as appropriate: She  has a past medical history of Allergy, Anxiety, Arthritis, Arthritis, Callus, Depression, Diabetes mellitus without complication ([place]), Hyperlipidemia, and Hypertension. She does not have any pertinent problems on file. She  has a past surgical history that includes Cholecystectomy; Abdominal hysterectomy; and Splenectomy. Her family history includes Brain cancer in her brother; Cancer in her brother, father, mother, and sister; Cancer - Lung in her brother; Diabetes in her brother, brother, father, maternal grandmother, mother, paternal grandmother, and sister. She  reports that she has never smoked. She has never used smokeless tobacco. She reports that she does not drink alcohol and does not use drugs. She has a current medication list  which includes the following prescription(s): acetaminophen, amlodipine, aspirin, atorvastatin, bilberry (vaccinium myrtillus), fluocinonide cream, furosemide, gabapentin, glucosamine hcl, onetouch verio, toujeo solostar, insulin lispro, insulin pen needle, loratadine, metoprolol succinate, multivitamin-lutein, mupirocin cream, NONFORMULARY OR COMPOUNDED ITEM, nystatin cream, onetouch delica lancets 0000000, ozempic (1 mg/dose), and sertraline. Current Outpatient Medications on File Prior to Visit  Medication Sig Dispense Refill   acetaminophen (TYLENOL) 650 MG CR tablet 2 by mouth once daily as needed     amLODipine  (NORVASC) 5 MG tablet TAKE 1 TABLET DAILY 90 tablet 1   aspirin 81 MG tablet Take 81 mg by mouth. 3 by mouth in the evening     atorvastatin (LIPITOR) 20 MG tablet TAKE 1/2 TABLET ON MONDAY, WEDNESDAY, FRIDAY AND      SUNDAYS 26 tablet 1   Bilberry, Vaccinium myrtillus, (BILBERRY PO) Take 470 mg by mouth. 2 by mouth daily     fluocinonide cream (LIDEX) [id number] % Apply 1 application topically 2 (two) times daily.     furosemide (LASIX) 40 MG tablet TAKE 1 TABLET DAILY 90 tablet 3   gabapentin (NEURONTIN) 100 MG capsule TAKE 1 CAPSULE BY MOUTH 3 TIMES DAILY 90 capsule 3   Glucosamine HCl (GLUCOSAMINE PO) Take by mouth daily.     glucose blood (ONETOUCH VERIO) test strip USE TO TEST BLOOD SUGAR    TWICE A DAY 200 strip 3   insulin glargine, 1 Unit Dial, (TOUJEO SOLOSTAR) 300 UNIT/ML Solostar Pen Inject 34 Units into the skin daily. 13.5 mL 1   insulin lispro (HUMALOG KWIKPEN) 100 UNIT/ML KwikPen Inject 7-9 units under the skin three times daily before meals. E11.65 30 mL 0   Insulin Pen Needle 32G X 4 MM MISC Use on insulin pens 4 times a day 400 each 2   Loratadine 10 MG CAPS Take 1 capsule by mouth daily as needed.      metoprolol succinate (TOPROL-XL) 100 MG 24 hr tablet TAKE 1 TABLET DAILY WITH ORIMMEDIATELY FOLLOWING A    MEAL 90 tablet 1   multivitamin-lutein (OCUVITE-LUTEIN) CAPS capsule Take 1 capsule by mouth daily. 90 capsule 3   mupirocin cream (BACTROBAN) 2 % APPLY 1 APPLICATION        TOPICALLY TWO TIMES A DAY 15 g 0   NONFORMULARY OR COMPOUNDED ITEM Lift chair  #1   Dx osteoarthritis knees 1 each 0   nystatin cream (MYCOSTATIN) Apply 1 application topically 2 (two) times daily. 30 g 0   OneTouch Delica Lancets 99991111 MISC Use to test blood sugar once daily- E11.65 100 each 3   OZEMPIC, 1 MG/DOSE, 4 MG/3ML SOPN INJECT '1MG'$  SUBCUTANEOUSLY  ONCE A WEEK. 9 mL 1   No current facility-administered medications on file prior to visit.   She is allergic to bupropion hcl, penicillins, citalopram  hydrobromide, metformin and related, blue dyes (parenteral), cobalt, gold-containing drug products, and nickel..  Review of Systems Review of Systems  Constitutional: Negative for activity change, appetite change and fatigue.  HENT: Negative for hearing loss, congestion, tinnitus and ear discharge.  dentist q28mEyes: Negative for visual disturbance (see optho q1y -- vision corrected to 20/20 with glasses).  Respiratory: Negative for cough, chest tightness and shortness of breath.   Cardiovascular: Negative for chest pain, palpitations and leg swelling.  Gastrointestinal: Negative for abdominal pain, diarrhea, constipation and abdominal distention.  Genitourinary: Negative for urgency, frequency, decreased urine volume and difficulty urinating.  Musculoskeletal: Negative for back pain, arthralgias and gait problem.  Skin: Negative for color change, pallor  and rash.  Neurological: Negative for dizziness, light-headedness, numbness and headaches.  Hematological: Negative for adenopathy. Does not bruise/bleed easily.  Psychiatric/Behavioral: Negative for suicidal ideas, confusion, sleep disturbance, self-injury, dysphoric mood, decreased concentration and agitation.      Objective:    BP 130/80 (BP Location: Right Arm, Patient Position: Sitting, Cuff Size: Large)   Pulse 84   Temp 98.7 F (37.1 C) (Oral)   Resp 20   Ht '5\' 9"'$  (1.753 m)   Wt 290 lb 6.4 oz (131.7 kg)   LMP  (LMP Unknown)   SpO2 95%   BMI 42.88 kg/m  General appearance: alert, cooperative, appears stated age, and no distress Head: Normocephalic, without obvious abnormality, atraumatic Eyes: conjunctivae/corneas clear. PERRL, EOM's intact. Fundi benign. Ears: normal TM's and external ear canals both ears Neck: no adenopathy, no carotid bruit, no JVD, supple, symmetrical, trachea midline, and thyroid not enlarged, symmetric, no tenderness/mass/nodules Back: symmetric, no curvature. ROM normal. No CVA tenderness. Lungs:  clear to auscultation bilaterally Breasts: normal appearance, no masses or tenderness Heart: regular rate and rhythm, S1, S2 normal, no murmur, click, rub or gallop Abdomen: soft, non-tender; bowel sounds normal; no masses,  no organomegaly Extremities: extremities normal, atraumatic, no cyanosis or edema Pulses: 2+ and symmetric Skin: Skin color, texture, turgor normal. No rashes or lesions Lymph nodes: Cervical, supraclavicular, and axillary nodes normal. Neurologic: Grossly normal    Assessment:    Healthy female exam.      Plan:    Ghm utd check labs  See avs See After Visit Summary for Counseling Recommendations   1. Need for influenza vaccination   - Flu Vaccine QUAD High Dose(Fluad)  2. Depression, major, single episode, in partial remission (HCC) Inc zoloft 100 mg qd   - sertraline (ZOLOFT) 100 MG tablet; TAKE 1 TABLET DAILY  Dispense: 90 tablet; Refill: 3  3. Type 2 diabetes mellitus with hyperglycemia, without long-term current use of insulin (HCC) Per endo - lisinopril (ZESTRIL) 10 MG tablet; Take 1 tablet (10 mg total) by mouth daily.  Dispense: 90 tablet; Refill: 3 - Lipid panel - Comprehensive metabolic panel - CBC with Differential/Platelet  4. Primary hypertension Well controlled, no changes to meds. Encouraged heart healthy diet such as the DASH diet and exercise as tolerated.  F/u 2-3 weeks since lisinopril added --- may be able to decrease one of the other bp meds  - lisinopril (ZESTRIL) 10 MG tablet; Take 1 tablet (10 mg total) by mouth daily.  Dispense: 90 tablet; Refill: 3 - Lipid panel - Comprehensive metabolic panel - CBC with Differential/Platelet

## 2021-09-12 ENCOUNTER — Other Ambulatory Visit: Payer: Self-pay | Admitting: Family Medicine

## 2021-09-12 DIAGNOSIS — E875 Hyperkalemia: Secondary | ICD-10-CM

## 2021-09-17 ENCOUNTER — Other Ambulatory Visit: Payer: Self-pay | Admitting: Family Medicine

## 2021-09-17 DIAGNOSIS — R600 Localized edema: Secondary | ICD-10-CM

## 2021-10-05 ENCOUNTER — Ambulatory Visit (INDEPENDENT_AMBULATORY_CARE_PROVIDER_SITE_OTHER): Payer: Medicare Other | Admitting: Family Medicine

## 2021-10-05 ENCOUNTER — Other Ambulatory Visit: Payer: Self-pay

## 2021-10-05 VITALS — BP 139/83 | HR 70

## 2021-10-05 DIAGNOSIS — I10 Essential (primary) hypertension: Secondary | ICD-10-CM | POA: Diagnosis not present

## 2021-10-05 DIAGNOSIS — H00015 Hordeolum externum left lower eyelid: Secondary | ICD-10-CM | POA: Diagnosis not present

## 2021-10-05 NOTE — Progress Notes (Signed)
BP Readings from Last 3 Encounters:  09/11/21 130/80  08/10/21 132/72  05/10/21 126/82    Patient here for blood pressure check after starting Lisinopril 10 mg 09-11-21. Patient reports she discontinue medication on 09-29-21 due to side effects.   BP today is 139/83, with a pulse of 70.   Per Dr. Etter Sjogren ok to stay off medication for now.    Is she willing to try lisinopril '5mg'$  (1/2 10 mg tab)? Ann Held, DO

## 2021-10-07 ENCOUNTER — Other Ambulatory Visit: Payer: Self-pay | Admitting: Endocrinology

## 2021-10-24 LAB — HM DIABETES EYE EXAM

## 2021-10-31 ENCOUNTER — Encounter: Payer: Self-pay | Admitting: Endocrinology

## 2021-11-13 ENCOUNTER — Other Ambulatory Visit: Payer: Medicare Other

## 2021-11-13 ENCOUNTER — Other Ambulatory Visit: Payer: Self-pay

## 2021-11-13 ENCOUNTER — Other Ambulatory Visit (INDEPENDENT_AMBULATORY_CARE_PROVIDER_SITE_OTHER): Payer: Medicare Other

## 2021-11-13 DIAGNOSIS — E1165 Type 2 diabetes mellitus with hyperglycemia: Secondary | ICD-10-CM

## 2021-11-13 DIAGNOSIS — Z794 Long term (current) use of insulin: Secondary | ICD-10-CM | POA: Diagnosis not present

## 2021-11-13 LAB — BASIC METABOLIC PANEL
BUN: 25 mg/dL — ABNORMAL HIGH (ref 6–23)
CO2: 22 mEq/L (ref 19–32)
Calcium: 9.1 mg/dL (ref 8.4–10.5)
Chloride: 106 mEq/L (ref 96–112)
Creatinine, Ser: 1.35 mg/dL — ABNORMAL HIGH (ref 0.40–1.20)
GFR: 35.98 mL/min — ABNORMAL LOW (ref >60.00)
Glucose, Bld: 165 mg/dL — ABNORMAL HIGH (ref 70–99)
Potassium: 4.3 mEq/L (ref 3.5–5.1)
Sodium: 141 mEq/L (ref 135–145)

## 2021-11-13 LAB — HEMOGLOBIN A1C: Hgb A1c MFr Bld: 7.4 % — ABNORMAL HIGH (ref 4.6–6.5)

## 2021-11-16 ENCOUNTER — Other Ambulatory Visit: Payer: Self-pay

## 2021-11-16 ENCOUNTER — Encounter: Payer: Self-pay | Admitting: Endocrinology

## 2021-11-16 ENCOUNTER — Ambulatory Visit: Payer: Medicare Other | Admitting: Endocrinology

## 2021-11-16 VITALS — BP 124/82 | HR 72 | Ht 65.0 in | Wt 288.6 lb

## 2021-11-16 DIAGNOSIS — E1165 Type 2 diabetes mellitus with hyperglycemia: Secondary | ICD-10-CM | POA: Diagnosis not present

## 2021-11-16 DIAGNOSIS — I10 Essential (primary) hypertension: Secondary | ICD-10-CM

## 2021-11-16 DIAGNOSIS — E1122 Type 2 diabetes mellitus with diabetic chronic kidney disease: Secondary | ICD-10-CM | POA: Diagnosis not present

## 2021-11-16 DIAGNOSIS — Z794 Long term (current) use of insulin: Secondary | ICD-10-CM

## 2021-11-16 DIAGNOSIS — N183 Chronic kidney disease, stage 3 unspecified: Secondary | ICD-10-CM

## 2021-11-16 NOTE — Progress Notes (Signed)
Patient ID: Katelyn Mahoney, female   DOB: 1934/12/05, 87 y.o.   MRN: [id number]            Reason for Appointment: Follow-up for Type 2 Diabetes  Referring physician: Etter Sjogren   History of Present Illness:          Date of diagnosis of type 2 diabetes mellitus: ?  1983        Background history:   She had previously been on metformin with fairly good control for some time She was also at some point treated with Janumet Apparently subsequently she could not tolerate metformin and had diarrhea This was switched to glipizide about 2 years ago and she is also being treated with Januvia and Chana Bode was started in 9/17  Review of A1c indicates that her levels had been significantly high since 2016 She was initially seen in consultation on 02/27/17  Recent history:       Non-insulin hypoglycemic drugs  : Ozempic 1 mg weekly  INSULIN regimen: Toujeo 34 units at supper, Humalog 9 units at lunch and supper and 7 units breakfast  A1c is relatively higher at 7.4 compared to 6.8, has been down to 6.4 previously   Current management, blood sugar patterns and problems identified: Her glipizide was stopped on the last visit She has not checked her blood sugar regularly and only sporadically She thinks her A1c is higher because of not eating well with her daughter now living with her; she tends to have more takeout food as well as desserts frequently in the evenings However her weight has actually come down somewhat She may occasionally forget her morning Humalog but otherwise is regular with all her injectable drugs Sometimes may take her Humalog right after eating instead of before Morning readings are minimally increased Lab glucose was 165 after lunch Also no hypoglycemia before Unable to do any  exercise        Side effects from medications have been: Diarrhea from metformin  Compliance with the medical regimen: Fair  Glucose monitoring:  done 1 +  times a day          Glucometer: One Touch Verio       Blood Glucose readings by review of monitor:   PRE-MEAL Fasting Lunch Dinner Bedtime Overall  Glucose range: 125-143      Mean/median:     133   POST-MEAL PC Breakfast PC Lunch PC Dinner  Glucose range:   106, 157  Mean/median:      Prior  PRE-MEAL Fasting Lunch Dinner Bedtime Overall  Glucose range: 88-145   119 85-227  Mean/median:  160   145   POST-MEAL PC Breakfast PC Lunch PC Dinner  Glucose range:  186 185  Mean/median:        Self-care:  Meal times are:  Breakfast is at 9-10 AM Typical meal intake: Breakfast is frequently cheese toast.  Lunch is variable or nothing.  Meals are usually chicken, salad, fish, sometimes vegetables only.  Snacks: Peanuts, yogurt                Dietician visit, most recent:1/19    Weight history: Previous range 249-305  Wt Readings from Last 3 Encounters:  11/16/21 288 lb 9.6 oz (130.9 kg)  09/11/21 290 lb 6.4 oz (131.7 kg)  08/10/21 (!) 300 lb 12.8 oz (136.4 kg)    Glycemic control:   Lab Results  Component Value Date   HGBA1C 7.4 (H) 11/13/2021   HGBA1C 6.8 (H) 08/08/2021  HGBA1C 6.4 05/05/2021   Lab Results  Component Value Date   MICROALBUR 6.0 (H) 05/05/2021   LDLCALC 68 09/11/2021   CREATININE 1.35 (H) 11/13/2021   Lab Results  Component Value Date   MICRALBCREAT 6.0 05/05/2021   Lab Results  Component Value Date   FRUCTOSAMINE 301 (H) 09/19/2017   FRUCTOSAMINE 312 (H) 06/20/2017   FRUCTOSAMINE 339 (H) 03/27/2017       Allergies as of 11/16/2021       Reactions   Bupropion Hcl    Palmar rash   Penicillins    rash   Citalopram Hydrobromide Other (See Comments)   Unknown   Metformin And Related Diarrhea   Blue Dyes (parenteral) Rash   Cobalt Rash   Gold-containing Drug Products Rash   Nickel Rash        Medication List        Accurate as of November 16, 2021  8:28 AM. If you have any questions, ask your nurse or doctor.          acetaminophen 650 MG  CR tablet Commonly known as: TYLENOL 2 by mouth once daily as needed   amLODipine 5 MG tablet Commonly known as: NORVASC TAKE 1 TABLET DAILY   aspirin 81 MG tablet Take 81 mg by mouth. 3 by mouth in the evening   atorvastatin 20 MG tablet Commonly known as: LIPITOR TAKE 1/2 TABLET ON MONDAY, WEDNESDAY, FRIDAY AND      SUNDAYS   BILBERRY PO Take 470 mg by mouth. 2 by mouth daily   fluocinonide cream 0.05 % Commonly known as: LIDEX Apply 1 application topically 2 (two) times daily.   furosemide 40 MG tablet Commonly known as: LASIX TAKE 1 TABLET DAILY   gabapentin 100 MG capsule Commonly known as: NEURONTIN TAKE 1 CAPSULE BY MOUTH 3 TIMES DAILY   GLUCOSAMINE PO Take by mouth daily.   insulin lispro 100 UNIT/ML KwikPen Commonly known as: HumaLOG KwikPen Inject 7-9 units under the skin three times daily before meals. E11.65   Insulin Pen Needle 32G X 4 MM Misc Use on insulin pens 4 times a day   lisinopril 10 MG tablet Commonly known as: ZESTRIL Take 1 tablet (10 mg total) by mouth daily.   Loratadine 10 MG Caps Take 1 capsule by mouth daily as needed.   metoprolol succinate 100 MG 24 hr tablet Commonly known as: TOPROL-XL TAKE 1 TABLET DAILY WITH ORIMMEDIATELY FOLLOWING A    MEAL   multivitamin-lutein Caps capsule Take 1 capsule by mouth daily.   mupirocin cream 2 % Commonly known as: BACTROBAN APPLY 1 APPLICATION        TOPICALLY TWO TIMES A DAY   NONFORMULARY OR COMPOUNDED ITEM Lift chair  #1   Dx osteoarthritis knees   nystatin cream Commonly known as: MYCOSTATIN Apply 1 application topically 2 (two) times daily.   OneTouch Delica Lancets 65H Misc Use to test blood sugar once daily- E11.65   OneTouch Verio test strip Generic drug: glucose blood USE TO TEST BLOOD SUGAR    TWICE A DAY   Ozempic (1 MG/DOSE) 4 MG/3ML Sopn Generic drug: Semaglutide (1 MG/DOSE) INJECT 1MG  SUBCUTANEOUSLY  ONCE A WEEK.   sertraline 100 MG tablet Commonly known as:  ZOLOFT TAKE 1 TABLET DAILY   Toujeo SoloStar 300 UNIT/ML Solostar Pen Generic drug: insulin glargine (1 Unit Dial) INJECT 34 UNITS            SUBCUTANEOUSLY DAILY        Allergies:  Allergies  Allergen Reactions   Bupropion Hcl     Palmar rash   Penicillins     rash   Citalopram Hydrobromide Other (See Comments)    Unknown   Metformin And Related Diarrhea   Blue Dyes (Parenteral) Rash   Cobalt Rash   Gold-Containing Drug Products Rash   Nickel Rash    Past Medical History:  Diagnosis Date   Allergy    Anxiety    Arthritis    Arthritis    Callus    Depression    Diabetes mellitus without complication ([place])    Hyperlipidemia    Hypertension     Past Surgical History:  Procedure Laterality Date   ABDOMINAL HYSTERECTOMY     CHOLECYSTECTOMY     SPLENECTOMY      Family History  Problem Relation Age of Onset   Cancer Mother    Diabetes Mother    Cancer Father    Diabetes Father    Cancer Sister    Diabetes Sister    Cancer Brother    Brain cancer Brother    Diabetes Brother    Diabetes Maternal Grandmother    Diabetes Paternal Grandmother    Cancer - Lung Brother    Diabetes Brother     Social History:  reports that she has never smoked. She has never used smokeless tobacco. She reports that she does not drink alcohol and does not use drugs.   Review of Systems   Lipid history: LDL controlled with 20 mg Lipitor prescribed by her PCP    Lab Results  Component Value Date   CHOL 161 09/11/2021   HDL 66.80 09/11/2021   LDLCALC 68 09/11/2021   LDLDIRECT 78.3 04/16/2013   TRIG 130.0 09/11/2021   CHOLHDL 2 09/11/2021           Hypertension: Controlled on  Metoprolol and amlodipine.  Lasix taken only occasionally  BP Readings from Last 3 Encounters:  11/16/21 124/82  10/05/21 139/83  09/11/21 130/80     CKD: stable  This is followed by nephrologist at times  History of HYPERKALEMIA: Potassium is normal consistently    Lab Results   Component Value Date   CREATININE 1.35 (H) 11/13/2021   CREATININE 1.38 (H) 09/11/2021   CREATININE 1.26 (H) 08/08/2021   CREATININE 1.19 05/05/2021   Lab Results  Component Value Date   K 4.3 11/13/2021     Eye exam last 10/2019  NEUROPATHY: She does have peripheral neuropathy with numbness  Most recent foot exam 9/22  She is taking  gabapentin, previously had symptoms of sharp pains in the left toes, started in 5/21  LABS:  Appointment on 11/13/2021  Component Date Value Ref Range Status   Sodium 11/13/2021 141  135 - 145 mEq/L Final   Potassium 11/13/2021 4.3  3.5 - 5.1 mEq/L Final   Chloride 11/13/2021 106  96 - 112 mEq/L Final   CO2 11/13/2021 22  19 - 32 mEq/L Final   Glucose, Bld 11/13/2021 165 (H)  70 - 99 mg/dL Final   BUN 11/13/2021 25 (H)  6 - 23 mg/dL Final   Creatinine, Ser 11/13/2021 1.35 (H)  0.40 - 1.20 mg/dL Final   GFR 11/13/2021 35.98 (L)  >60.00 mL/min Final   Calculated using the CKD-EPI Creatinine Equation (2021)   Calcium 11/13/2021 9.1  8.4 - 10.5 mg/dL Final   Hgb A1c MFr Bld 11/13/2021 7.4 (H)  4.6 - 6.5 % Final   Glycemic Control Guidelines for People with  Diabetes:Non Diabetic:  <6%Goal of Therapy: <7%Additional Action Suggested:  >8%     Physical Examination:  BP 124/82   Pulse 72   Ht 5\' 5"  (1.651 m)   Wt 288 lb 9.6 oz (130.9 kg)   LMP  (LMP Unknown)   SpO2 95%   BMI 48.03 kg/m     ASSESSMENT:  Diabetes type 2, with morbid obesity, insulin requiring  See history of present illness for detailed discussion of current diabetes management, blood sugar patterns and problems identified   She is on basal bolus insulin regimen, glipizide and Ozempic  A1c is further increased at 7.4, previously was also higher at 6.8  She is not checking blood sugars regularly again She thinks her control is not as good because of change in her diet and lifestyle  Hypertension: Well-controlled  With her history of hyperkalemia would be reluctant  to add another drug such as losartan  CKD: Her creatinine is relatively high but stable, no history of microalbuminuria   PLAN:   She will try to check her blood sugars more often especially after meals Consider consultation with a dietitian, offered her a session where her daughter can also come in She will try to cut back on cakes and pies and try to eat more sugar-free desserts Also she will try to be consistent with taking her Humalog right before eating Consider 2 mg Ozempic on the next visit if her A1c is higher again No change in dosage for her Toujeo or Humalog as yet  Follow-up in 3 months  There are no Patient Instructions on file for this visit.   Katelyn Mahoney 11/16/2021, 8:28 AM   Note: This office note was prepared with Dragon voice recognition system technology. Any transcriptional errors that result from this process are unintentional.

## 2021-11-19 ENCOUNTER — Other Ambulatory Visit: Payer: Self-pay | Admitting: Endocrinology

## 2021-11-19 DIAGNOSIS — E1165 Type 2 diabetes mellitus with hyperglycemia: Secondary | ICD-10-CM

## 2021-12-12 ENCOUNTER — Encounter (HOSPITAL_COMMUNITY): Payer: Self-pay | Admitting: Emergency Medicine

## 2021-12-12 ENCOUNTER — Other Ambulatory Visit: Payer: Self-pay

## 2021-12-12 ENCOUNTER — Emergency Department (HOSPITAL_COMMUNITY): Payer: Medicare Other

## 2021-12-12 ENCOUNTER — Inpatient Hospital Stay (HOSPITAL_COMMUNITY)
Admission: EM | Admit: 2021-12-12 | Discharge: 2021-12-27 | DRG: 871 | Disposition: A | Discharge status: Home Health | Payer: Medicare Other | Attending: Internal Medicine | Admitting: Internal Medicine

## 2021-12-12 DIAGNOSIS — E538 Deficiency of other specified B group vitamins: Secondary | ICD-10-CM | POA: Diagnosis present

## 2021-12-12 DIAGNOSIS — Z20822 Contact with and (suspected) exposure to covid-19: Secondary | ICD-10-CM | POA: Diagnosis present

## 2021-12-12 DIAGNOSIS — J9811 Atelectasis: Secondary | ICD-10-CM | POA: Diagnosis not present

## 2021-12-12 DIAGNOSIS — Z09 Encounter for follow-up examination after completed treatment for conditions other than malignant neoplasm: Secondary | ICD-10-CM

## 2021-12-12 DIAGNOSIS — I248 Other forms of acute ischemic heart disease: Secondary | ICD-10-CM | POA: Diagnosis present

## 2021-12-12 DIAGNOSIS — R6510 Systemic inflammatory response syndrome (SIRS) of non-infectious origin without acute organ dysfunction: Secondary | ICD-10-CM

## 2021-12-12 DIAGNOSIS — R6520 Severe sepsis without septic shock: Secondary | ICD-10-CM | POA: Diagnosis present

## 2021-12-12 DIAGNOSIS — Z6841 Body Mass Index (BMI) 40.0 and over, adult: Secondary | ICD-10-CM | POA: Diagnosis not present

## 2021-12-12 DIAGNOSIS — I130 Hypertensive heart and chronic kidney disease with heart failure and stage 1 through stage 4 chronic kidney disease, or unspecified chronic kidney disease: Secondary | ICD-10-CM | POA: Diagnosis present

## 2021-12-12 DIAGNOSIS — A4159 Other Gram-negative sepsis: Principal | ICD-10-CM | POA: Diagnosis present

## 2021-12-12 DIAGNOSIS — Z833 Family history of diabetes mellitus: Secondary | ICD-10-CM

## 2021-12-12 DIAGNOSIS — I509 Heart failure, unspecified: Secondary | ICD-10-CM

## 2021-12-12 DIAGNOSIS — E872 Acidosis, unspecified: Secondary | ICD-10-CM | POA: Diagnosis present

## 2021-12-12 DIAGNOSIS — I4891 Unspecified atrial fibrillation: Secondary | ICD-10-CM | POA: Diagnosis not present

## 2021-12-12 DIAGNOSIS — F32A Depression, unspecified: Secondary | ICD-10-CM | POA: Diagnosis present

## 2021-12-12 DIAGNOSIS — N1832 Chronic kidney disease, stage 3b: Secondary | ICD-10-CM | POA: Diagnosis present

## 2021-12-12 DIAGNOSIS — Z9189 Other specified personal risk factors, not elsewhere classified: Secondary | ICD-10-CM

## 2021-12-12 DIAGNOSIS — I214 Non-ST elevation (NSTEMI) myocardial infarction: Secondary | ICD-10-CM

## 2021-12-12 DIAGNOSIS — N390 Urinary tract infection, site not specified: Secondary | ICD-10-CM | POA: Diagnosis present

## 2021-12-12 DIAGNOSIS — I5033 Acute on chronic diastolic (congestive) heart failure: Secondary | ICD-10-CM | POA: Diagnosis not present

## 2021-12-12 DIAGNOSIS — R059 Cough, unspecified: Secondary | ICD-10-CM

## 2021-12-12 DIAGNOSIS — R0609 Other forms of dyspnea: Secondary | ICD-10-CM | POA: Diagnosis not present

## 2021-12-12 DIAGNOSIS — J9601 Acute respiratory failure with hypoxia: Secondary | ICD-10-CM | POA: Diagnosis present

## 2021-12-12 DIAGNOSIS — N189 Chronic kidney disease, unspecified: Secondary | ICD-10-CM | POA: Diagnosis not present

## 2021-12-12 DIAGNOSIS — Z794 Long term (current) use of insulin: Secondary | ICD-10-CM | POA: Diagnosis not present

## 2021-12-12 DIAGNOSIS — Z9081 Acquired absence of spleen: Secondary | ICD-10-CM

## 2021-12-12 DIAGNOSIS — E1122 Type 2 diabetes mellitus with diabetic chronic kidney disease: Secondary | ICD-10-CM | POA: Diagnosis present

## 2021-12-12 DIAGNOSIS — R319 Hematuria, unspecified: Secondary | ICD-10-CM | POA: Diagnosis not present

## 2021-12-12 DIAGNOSIS — R0602 Shortness of breath: Secondary | ICD-10-CM

## 2021-12-12 DIAGNOSIS — E785 Hyperlipidemia, unspecified: Secondary | ICD-10-CM | POA: Diagnosis present

## 2021-12-12 DIAGNOSIS — E669 Obesity, unspecified: Secondary | ICD-10-CM | POA: Diagnosis present

## 2021-12-12 DIAGNOSIS — R0600 Dyspnea, unspecified: Secondary | ICD-10-CM

## 2021-12-12 DIAGNOSIS — Z7985 Long-term (current) use of injectable non-insulin antidiabetic drugs: Secondary | ICD-10-CM

## 2021-12-12 DIAGNOSIS — E860 Dehydration: Secondary | ICD-10-CM | POA: Diagnosis present

## 2021-12-12 DIAGNOSIS — Z7982 Long term (current) use of aspirin: Secondary | ICD-10-CM

## 2021-12-12 DIAGNOSIS — Z886 Allergy status to analgesic agent status: Secondary | ICD-10-CM

## 2021-12-12 DIAGNOSIS — E119 Type 2 diabetes mellitus without complications: Secondary | ICD-10-CM

## 2021-12-12 DIAGNOSIS — N179 Acute kidney failure, unspecified: Secondary | ICD-10-CM | POA: Diagnosis present

## 2021-12-12 DIAGNOSIS — B964 Proteus (mirabilis) (morganii) as the cause of diseases classified elsewhere: Secondary | ICD-10-CM | POA: Diagnosis present

## 2021-12-12 DIAGNOSIS — E1165 Type 2 diabetes mellitus with hyperglycemia: Secondary | ICD-10-CM

## 2021-12-12 DIAGNOSIS — Z66 Do not resuscitate: Secondary | ICD-10-CM | POA: Diagnosis present

## 2021-12-12 DIAGNOSIS — I493 Ventricular premature depolarization: Secondary | ICD-10-CM | POA: Diagnosis present

## 2021-12-12 DIAGNOSIS — Z91048 Other nonmedicinal substance allergy status: Secondary | ICD-10-CM

## 2021-12-12 DIAGNOSIS — Z880 Allergy status to penicillin: Secondary | ICD-10-CM

## 2021-12-12 DIAGNOSIS — A419 Sepsis, unspecified organism: Secondary | ICD-10-CM | POA: Diagnosis present

## 2021-12-12 DIAGNOSIS — Z808 Family history of malignant neoplasm of other organs or systems: Secondary | ICD-10-CM

## 2021-12-12 DIAGNOSIS — E1169 Type 2 diabetes mellitus with other specified complication: Secondary | ICD-10-CM

## 2021-12-12 DIAGNOSIS — Z79899 Other long term (current) drug therapy: Secondary | ICD-10-CM

## 2021-12-12 DIAGNOSIS — I10 Essential (primary) hypertension: Secondary | ICD-10-CM

## 2021-12-12 LAB — URINALYSIS, ROUTINE W REFLEX MICROSCOPIC
Glucose, UA: NEGATIVE mg/dL
Ketones, ur: NEGATIVE mg/dL
Nitrite: NEGATIVE
Protein, ur: 100 mg/dL — AB
Specific Gravity, Urine: 1.02 (ref 1.005–1.030)
pH: 6 (ref 5.0–8.0)

## 2021-12-12 LAB — RESPIRATORY PANEL BY PCR

## 2021-12-12 LAB — RESP PANEL BY RT-PCR (FLU A&B, COVID) ARPGX2
Influenza A by PCR: NEGATIVE
Influenza B by PCR: NEGATIVE
SARS Coronavirus 2 by RT PCR: NEGATIVE

## 2021-12-12 LAB — CBC WITH DIFFERENTIAL/PLATELET
Abs Immature Granulocytes: 0.07 10*3/uL (ref 0.00–0.07)
Basophils Absolute: 0.1 10*3/uL (ref 0.0–0.1)
Basophils Relative: 0 %
Eosinophils Absolute: 0 10*3/uL (ref 0.0–0.5)
Eosinophils Relative: 0 %
HCT: 43.5 % (ref 36.0–46.0)
Hemoglobin: 14.3 g/dL (ref 12.0–15.0)
Immature Granulocytes: 0 %
Lymphocytes Relative: 8 %
Lymphs Abs: 1.6 10*3/uL (ref 0.7–4.0)
MCH: 34.1 pg — ABNORMAL HIGH (ref 26.0–34.0)
MCHC: 32.9 g/dL (ref 30.0–36.0)
MCV: 103.8 fL — ABNORMAL HIGH (ref 80.0–100.0)
Monocytes Absolute: 0.4 10*3/uL (ref 0.1–1.0)
Monocytes Relative: 2 %
Neutro Abs: 16.8 10*3/uL — ABNORMAL HIGH (ref 1.7–7.7)
Neutrophils Relative %: 90 %
Platelets: 286 10*3/uL (ref 150–400)
RBC: 4.19 MIL/uL (ref 3.87–5.11)
RDW: 14.9 % (ref 11.5–15.5)
WBC: 19 10*3/uL — ABNORMAL HIGH (ref 4.0–10.5)
nRBC: 0.3 % — ABNORMAL HIGH (ref 0.0–0.2)

## 2021-12-12 LAB — BASIC METABOLIC PANEL
Anion gap: 13 (ref 5–15)
BUN: 29 mg/dL — ABNORMAL HIGH (ref 8–23)
CO2: 19 mmol/L — ABNORMAL LOW (ref 22–32)
Calcium: 8.7 mg/dL — ABNORMAL LOW (ref 8.9–10.3)
Chloride: 106 mmol/L (ref 98–111)
Creatinine, Ser: 1.83 mg/dL — ABNORMAL HIGH (ref 0.44–1.00)
GFR, Estimated: 27 mL/min — ABNORMAL LOW (ref >60)
Glucose, Bld: 236 mg/dL — ABNORMAL HIGH (ref 70–99)
Potassium: 4 mmol/L (ref 3.5–5.1)
Sodium: 138 mmol/L (ref 135–145)

## 2021-12-12 LAB — HEPATIC FUNCTION PANEL
ALT: 31 U/L (ref 0–44)
AST: 49 U/L — ABNORMAL HIGH (ref 15–41)
Albumin: 2.5 g/dL — ABNORMAL LOW (ref 3.5–5.0)
Alkaline Phosphatase: 92 U/L (ref 38–126)
Bilirubin, Direct: 0.8 mg/dL — ABNORMAL HIGH (ref 0.0–0.2)
Indirect Bilirubin: 1 mg/dL — ABNORMAL HIGH (ref 0.3–0.9)
Total Bilirubin: 1.8 mg/dL — ABNORMAL HIGH (ref 0.3–1.2)
Total Protein: 6.2 g/dL — ABNORMAL LOW (ref 6.5–8.1)

## 2021-12-12 LAB — URINALYSIS, MICROSCOPIC (REFLEX): WBC, UA: >50 WBC/hpf (ref 0–5)

## 2021-12-12 LAB — LACTIC ACID, PLASMA
Lactic Acid, Venous: 2.4 mmol/L (ref 0.5–1.9)
Lactic Acid, Venous: 2.4 mmol/L (ref 0.5–1.9)
Lactic Acid, Venous: 1.9 mmol/L (ref 0.5–1.9)

## 2021-12-12 LAB — BRAIN NATRIURETIC PEPTIDE: B Natriuretic Peptide: 490.1 pg/mL — ABNORMAL HIGH (ref 0.0–100.0)

## 2021-12-12 LAB — CBG MONITORING, ED: Glucose-Capillary: 283 mg/dL — ABNORMAL HIGH (ref 70–99)

## 2021-12-12 LAB — VITAMIN B12: Vitamin B-12: 158 pg/mL — ABNORMAL LOW (ref 180–914)

## 2021-12-12 LAB — TROPONIN I (HIGH SENSITIVITY)
Troponin I (High Sensitivity): 33 ng/L — ABNORMAL HIGH (ref <18)
Troponin I (High Sensitivity): 38 ng/L — ABNORMAL HIGH (ref <18)

## 2021-12-12 MED ORDER — INSULIN ASPART 100 UNIT/ML IJ SOLN
6.0000 [IU] | Freq: Three times a day (TID) | INTRAMUSCULAR | Status: DC
  Administered 2021-12-12 – 2021-12-27 (×43): 6 [IU] via SUBCUTANEOUS

## 2021-12-12 MED ORDER — ACETAMINOPHEN 325 MG PO TABS
650.0000 mg | ORAL_TABLET | Freq: Once | ORAL | Status: AC | PRN
  Administered 2021-12-12: 650 mg via ORAL
  Filled 2021-12-12: qty 2

## 2021-12-12 MED ORDER — ASPIRIN 81 MG PO TABS: 243.0000 mg | ORAL_TABLET | Freq: Every day | ORAL | Status: DC

## 2021-12-12 MED ORDER — ACETAMINOPHEN 325 MG PO TABS
650.0000 mg | ORAL_TABLET | Freq: Four times a day (QID) | ORAL | Status: DC | PRN
  Administered 2021-12-14 – 2021-12-26 (×11): 650 mg via ORAL
  Filled 2021-12-12 (×13): qty 2

## 2021-12-12 MED ORDER — METOPROLOL SUCCINATE ER 100 MG PO TB24
100.0000 mg | ORAL_TABLET | Freq: Every day | ORAL | Status: DC
  Administered 2021-12-12 – 2021-12-24 (×13): 100 mg via ORAL
  Filled 2021-12-12: qty 4
  Filled 2021-12-12 (×3): qty 1
  Filled 2021-12-12: qty 4
  Filled 2021-12-12 (×3): qty 1
  Filled 2021-12-12: qty 4
  Filled 2021-12-12 (×5): qty 1

## 2021-12-12 MED ORDER — SERTRALINE HCL 100 MG PO TABS
100.0000 mg | ORAL_TABLET | Freq: Every day | ORAL | Status: DC
  Administered 2021-12-13 – 2021-12-27 (×15): 100 mg via ORAL
  Filled 2021-12-12 (×15): qty 1

## 2021-12-12 MED ORDER — ASPIRIN EC 81 MG PO TBEC
81.0000 mg | DELAYED_RELEASE_TABLET | Freq: Every day | ORAL | Status: DC
  Administered 2021-12-13 – 2021-12-27 (×15): 81 mg via ORAL
  Filled 2021-12-12 (×15): qty 1

## 2021-12-12 MED ORDER — LACTATED RINGERS IV BOLUS
1000.0000 mL | Freq: Once | INTRAVENOUS | Status: AC
  Administered 2021-12-12: 1000 mL via INTRAVENOUS

## 2021-12-12 MED ORDER — CEFTRIAXONE SODIUM 1 G IJ SOLR
1.0000 g | INTRAMUSCULAR | Status: DC
  Administered 2021-12-13 (×2): 1 g via INTRAVENOUS
  Filled 2021-12-12 (×3): qty 10

## 2021-12-12 MED ORDER — HEPARIN SODIUM (PORCINE) 5000 UNIT/ML IJ SOLN
5000.0000 [IU] | Freq: Three times a day (TID) | INTRAMUSCULAR | Status: DC
  Administered 2021-12-12 – 2021-12-15 (×8): 5000 [IU] via SUBCUTANEOUS
  Filled 2021-12-12 (×8): qty 1

## 2021-12-12 MED ORDER — INSULIN GLARGINE-YFGN 100 UNIT/ML ~~LOC~~ SOLN
25.0000 [IU] | Freq: Every day | SUBCUTANEOUS | Status: DC
  Administered 2021-12-12: 25 [IU] via SUBCUTANEOUS
  Filled 2021-12-12 (×2): qty 0.25

## 2021-12-12 MED ORDER — ACETAMINOPHEN 650 MG RE SUPP: 650.0000 mg | Freq: Four times a day (QID) | RECTAL | Status: DC | PRN

## 2021-12-12 MED ORDER — SODIUM CHLORIDE 0.9 % IV SOLN: 2.0000 g | Freq: Three times a day (TID) | INTRAVENOUS | Status: DC

## 2021-12-12 MED ORDER — ALBUTEROL SULFATE (2.5 MG/3ML) 0.083% IN NEBU
3.0000 mL | INHALATION_SOLUTION | Freq: Four times a day (QID) | RESPIRATORY_TRACT | Status: DC | PRN
  Administered 2021-12-14 – 2021-12-15 (×5): 3 mL via RESPIRATORY_TRACT
  Filled 2021-12-12 (×6): qty 3

## 2021-12-12 MED ORDER — ASPIRIN 325 MG PO TABS
325.0000 mg | ORAL_TABLET | Freq: Every day | ORAL | Status: DC
  Administered 2021-12-12: 325 mg via ORAL
  Filled 2021-12-12: qty 1

## 2021-12-12 MED ORDER — INSULIN ASPART 100 UNIT/ML IJ SOLN
0.0000 [IU] | Freq: Three times a day (TID) | INTRAMUSCULAR | Status: DC
  Administered 2021-12-12: 8 [IU] via SUBCUTANEOUS
  Administered 2021-12-13: 18:00:00 3 [IU] via SUBCUTANEOUS
  Administered 2021-12-13: 09:00:00 15 [IU] via SUBCUTANEOUS
  Administered 2021-12-13: 13:00:00 11 [IU] via SUBCUTANEOUS
  Administered 2021-12-14 (×2): 5 [IU] via SUBCUTANEOUS
  Administered 2021-12-14 – 2021-12-17 (×5): 2 [IU] via SUBCUTANEOUS
  Administered 2021-12-17 – 2021-12-18 (×3): 3 [IU] via SUBCUTANEOUS
  Administered 2021-12-18 – 2021-12-19 (×2): 2 [IU] via SUBCUTANEOUS
  Administered 2021-12-19: 13:00:00 3 [IU] via SUBCUTANEOUS
  Administered 2021-12-20 – 2021-12-21 (×2): 2 [IU] via SUBCUTANEOUS
  Administered 2021-12-21 – 2021-12-22 (×2): 3 [IU] via SUBCUTANEOUS
  Administered 2021-12-22 – 2021-12-23 (×4): 2 [IU] via SUBCUTANEOUS
  Administered 2021-12-24: 19:00:00 5 [IU] via SUBCUTANEOUS
  Administered 2021-12-24 (×2): 2 [IU] via SUBCUTANEOUS
  Administered 2021-12-25: 09:00:00 3 [IU] via SUBCUTANEOUS
  Administered 2021-12-25 – 2021-12-27 (×5): 2 [IU] via SUBCUTANEOUS

## 2021-12-12 MED ORDER — METRONIDAZOLE 500 MG/100ML IV SOLN
500.0000 mg | Freq: Once | INTRAVENOUS | Status: AC
  Administered 2021-12-12: 500 mg via INTRAVENOUS
  Filled 2021-12-12: qty 100

## 2021-12-12 MED ORDER — VANCOMYCIN VARIABLE DOSE PER UNSTABLE RENAL FUNCTION (PHARMACIST DOSING): Status: DC

## 2021-12-12 MED ORDER — AMLODIPINE BESYLATE 5 MG PO TABS
5.0000 mg | ORAL_TABLET | Freq: Every day | ORAL | Status: DC
  Administered 2021-12-13 – 2021-12-24 (×12): 5 mg via ORAL
  Filled 2021-12-12 (×12): qty 1

## 2021-12-12 MED ORDER — VANCOMYCIN HCL 10 G IV SOLR
2500.0000 mg | Freq: Once | INTRAVENOUS | Status: AC
  Administered 2021-12-12: 2500 mg via INTRAVENOUS
  Filled 2021-12-12: qty 2500

## 2021-12-12 MED ORDER — SODIUM CHLORIDE 0.9 % IV SOLN
2.0000 g | Freq: Once | INTRAVENOUS | Status: AC
  Administered 2021-12-12: 2 g via INTRAVENOUS
  Filled 2021-12-12: qty 2

## 2021-12-12 MED ORDER — LACTATED RINGERS IV SOLN: INTRAVENOUS | Status: DC

## 2021-12-12 NOTE — Progress Notes (Signed)
eLink is following this Code Sepsis. °

## 2021-12-12 NOTE — ED Provider Notes (Signed)
Emergency Medicine Provider Triage Evaluation Note  Katelyn Mahoney , a 87 y.o. female  was evaluated in triage.  Patient complains of shortness of breath that started last Thursday.  She said it is gradually worsened.  She has had subjective chills and fevers at home.  She also had a new cough.  She had a negative home COVID test.  She denies any history of cardiac disease such as previous MI or heart failure.  She has had some leg swelling but this is not new for her.  She was found to have saturations to 87% on room air when she was seen by EMS with labored breathing.  She was given albuterol, Solu-Medrol, mag and started on 2 L of oxygen.  Review of Systems  Positive:  Negative:   Physical Exam  BP (!) 169/106    Pulse (!) 118    Temp (!) 101.8 F (38.8 C) (Oral)    Resp (!) 28    LMP  (LMP Unknown)    SpO2 96%  Gen:   Awake, no distress   Resp:  Normal effort  MSK:   Moves extremities without difficulty  Other:    Medical Decision Making  Medically screening exam initiated at 11:53 AM.  Appropriate orders placed.  Brylinn Teaney was informed that the remainder of the evaluation will be completed by another provider, this initial triage assessment does not replace that evaluation, and the importance of remaining in the ED until their evaluation is complete.     Adolphus Birchwood, PA-C 12/12/21 1155    Daleen Bo, MD 12/12/21 [phone]

## 2021-12-12 NOTE — Progress Notes (Signed)
Pharmacy Antibiotic Note  Katelyn Mahoney is a 87 y.o. female admitted on 12/12/2021 with sepsis.  Pharmacy has been consulted for vancomycin and aztreonam dosing.  Patient with a history of diabetes mellitus, hyperlipidemia, hypertension among others. Patient presenting with SOB.  Patient's serum creatinine is 1.83 which is significantly above baseline. WBC 19; T 101.8 F  Plan: Aztreonam 2g q8hr Flagyl per MD Vancomycin 2500 mg once, subsequent dosing as indicated per random vancomycin level until renal function stable and/or improved, at which time scheduled dosing can be considered Trend WBC, fever, renal function, and clinical course Follow-up cultures and de-escalate antibiotics as appropriate.     Temp (24hrs), Avg:101.8 F (38.8 C), Min:101.8 F (38.8 C), Max:101.8 F (38.8 C)  Recent Labs  Lab 12/12/21 1151  WBC 19.0*  CREATININE 1.83*    CrCl cannot be calculated (Unknown ideal weight.).    Allergies  Allergen Reactions   Bupropion Hcl     Palmar rash   Penicillins     rash   Citalopram Hydrobromide Other (See Comments)    Unknown   Metformin And Related Diarrhea   Zinc Other (See Comments)    unknown ...   Blue Dyes (Parenteral) Rash   Cobalt Rash   Gold-Containing Drug Products Rash   Nickel Rash   Antimicrobials this admission: Vancomycin 12/13 >>  Aztreonam 12/13 >>  Metronidazole 12/13 >>   Microbiology results: Pending  Thank you for allowing pharmacy to be a part of this patients care.  Lorelei Pont, PharmD, BCPS 12/12/2021 3:15 PM ED Clinical Pharmacist -  [phone]

## 2021-12-12 NOTE — H&P (Addendum)
. Date: 12/12/2021               Patient Name:  Katelyn Mahoney MRN: [id number]  DOB: [birth year] Age / Sex: 87 y.o., female   PCP: Ann Held, DO         Medical Service: Internal Medicine Teaching Service         Attending Physician: Dr. Evette Doffing, Mallie Mussel, *    First Contact: [name], MD Pager: AD [phone]  Second Contact: Iona Beard, MD Pager: [place]ook[place] [phone]       After Hours (After 5p/  First Contact Pager: [phone]  weekends / holidays): Second Contact Pager: [phone]   SUBJECTIVE   Chief Complaint: Shortness of breath  History of Present Illness:  Katelyn Mahoney is an 87 y.o. F with a PMH of DMII, HTN, obesity, CKD, and chronic lower extremity edema is presenting with a 1 week of dyspnea with associated cough, sore throat, and white sputum production. Thursday she also started to feel chills and what she presumes was a fever. She has been progressively getting worse of the last several days and today just could not catch her breath. Prior to this cough she had been in her normal state of health. She has chronic lower leg swelling at baseline and denies that it is any worse today. Denies dysuria, abnormal color of urine, hematuria, but does say that she has been urinating a lot. She denies any GI symptoms such as nausea or vomiting, but does have chronic diarrhea since starting ozempic. No recent medication changes or health issues except that she has not taken her lasix for about 2 week because she does not like having to go to the bathroom a lot when she is not at home. She is also not sure why she is taking lasix as it was started many many years ago.She says that the shortness of breath she is feeling does feel similar to when she had pneumonia many years ago.   Meds:  Current Meds  Medication Sig   acetaminophen (TYLENOL) 650 MG CR tablet 650 mg every 8 (eight) hours.   amLODipine (NORVASC) 5 MG tablet TAKE 1 TABLET DAILY (Patient taking differently: Take 5  mg by mouth daily.)   aspirin 81 MG tablet Take 243 mg by mouth daily.   atorvastatin (LIPITOR) 20 MG tablet TAKE 1/2 TABLET ON MONDAY, WEDNESDAY, FRIDAY AND      SUNDAYS (Patient taking differently: Take 10 mg by mouth See admin instructions. TAKE 1/2 TABLET ON MONDAY, WEDNESDAY, FRIDAY AND      SUNDAYS)   Bilberry, Vaccinium myrtillus, (BILBERRY PO) Take 1 tablet by mouth daily.   fluocinonide cream (LIDEX) 3.14 % Apply 1 application topically daily as needed (breakouts).   furosemide (LASIX) 40 MG tablet TAKE 1 TABLET DAILY (Patient taking differently: Take 40 mg by mouth daily as needed for fluid.)   gabapentin (NEURONTIN) 100 MG capsule TAKE 1 CAPSULE BY MOUTH 3 TIMES DAILY (Patient taking differently: 100 mg 2 (two) times daily.)   Glucosamine HCl (GLUCOSAMINE PO) Take 1 tablet by mouth daily.   insulin lispro (HUMALOG KWIKPEN) 100 UNIT/ML KwikPen Inject 7-9 units under the skin three times daily before meals. E11.65 (Patient taking differently: 7-9 Units 3 (three) times daily.)   Loratadine 10 MG CAPS Take 10 mg by mouth daily.   metoprolol succinate (TOPROL-XL) 100 MG 24 hr tablet TAKE 1 TABLET DAILY WITH ORIMMEDIATELY FOLLOWING A    MEAL (Patient taking differently: 100 mg daily.)  multivitamin-lutein (OCUVITE-LUTEIN) CAPS capsule Take 1 capsule by mouth daily.   OZEMPIC, 1 MG/DOSE, 4 MG/3ML SOPN INJECT 1MG SUBCUTANEOUSLY  ONCE A WEEK. (Patient taking differently: Inject 1 mg into the skin once a week. Saturdays)   sertraline (ZOLOFT) 100 MG tablet TAKE 1 TABLET DAILY (Patient taking differently: Take 100 mg by mouth daily.)   TOUJEO SOLOSTAR 300 UNIT/ML Solostar Pen INJECT 34 UNITS            SUBCUTANEOUSLY DAILY (Patient taking differently: 34 Units daily.)    Past Medical History:  Diagnosis Date   Allergy    Anxiety    Arthritis    Arthritis    Callus    Depression    Diabetes mellitus without complication ([place])    Hyperlipidemia    Hypertension     Past Surgical History:   Procedure Laterality Date   ABDOMINAL HYSTERECTOMY     CHOLECYSTECTOMY     SPLENECTOMY      Social:  Lives With: daughter and her husband Occupation: retired Support: family Level of Function: able to complete ADLs, some help with iADLs UJW:JXBJY [place], [employer] [place] R, DO Substances: none, but does have a significant passive smoke exposure both at work and at home when her husband was alive  Family History:   Father with history of gastric cancer Mother unknown medical history may have had heart problems Sister with breast cancer Sister with aneurysm  Allergies: Allergies as of 12/12/2021 - Review Complete 12/12/2021  Allergen Reaction Noted   Bupropion hcl     Penicillins     Citalopram hydrobromide Other (See Comments)    Metformin and related Diarrhea 08/02/2016   Zinc Other (See Comments) 10/24/2020   Blue dyes (parenteral) Rash 03/01/2020   Cobalt Rash 03/01/2020   Gold-containing drug products Rash 03/01/2020   Nickel Rash 03/01/2020    Review of Systems: A complete ROS was negative except as per HPI.   OBJECTIVE:   Physical Exam: Blood pressure (!) 133/96, pulse (!) 107, temperature (!) 101.8 F (38.8 C), temperature source Oral, resp. rate (!) 28, SpO2 98 %.  Constitutional: obese elderly woman resting uncomfortably in bed, in no acute distress Cardiovascular: regular rate and rhythm, no m/r/g, no appreciable JVD or hepatojugular reflex, 1+ pitting edema of the BLE Pulmonary/Chest: increased work of breathing on 4 L [place]. Able to speak in full sentences but with some tachypnea, bibasilar crackles and mild expiratory wheezing Abdominal: soft, non-tender, distended MSK: normal bulk and tone Neurological: alert & oriented x 3, mild resting tremor of the left hand Skin: warm and dry Psych: normal affect  Labs: CBC    Component Value Date/Time   WBC 19.0 (H) 12/12/2021 1151   RBC 4.19 12/12/2021 1151   HGB 14.3 12/12/2021 1151   HGB 15.5 05/03/2016 1027   HCT  43.5 12/12/2021 1151   HCT 43.2 05/03/2016 1027   PLT 286 12/12/2021 1151   PLT 322 05/03/2016 1027   MCV 103.8 (H) 12/12/2021 1151   MCV 95 05/03/2016 1027   MCH 34.1 (H) 12/12/2021 1151   MCHC 32.9 12/12/2021 1151   RDW 14.9 12/12/2021 1151   RDW 15.6 05/03/2016 1027   LYMPHSABS 1.6 12/12/2021 1151   LYMPHSABS 2.8 05/03/2016 1027   MONOABS 0.4 12/12/2021 1151   EOSABS 0.0 12/12/2021 1151   EOSABS 0.5 05/03/2016 1027   BASOSABS 0.1 12/12/2021 1151   BASOSABS 0.1 05/03/2016 1027     CMP     Component Value Date/Time   NA 138  12/12/2021 1151   K 4.0 12/12/2021 1151   CL 106 12/12/2021 1151   CO2 19 (L) 12/12/2021 1151   GLUCOSE 236 (H) 12/12/2021 1151   BUN 29 (H) 12/12/2021 1151   CREATININE 1.83 (H) 12/12/2021 1151   CREATININE 1.34 (H) 09/08/2020 0840   CALCIUM 8.7 (L) 12/12/2021 1151   PROT 6.2 (L) 12/12/2021 1351   ALBUMIN 2.5 (L) 12/12/2021 1351   AST 49 (H) 12/12/2021 1351   ALT 31 12/12/2021 1351   ALKPHOS 92 12/12/2021 1351   BILITOT 1.8 (H) 12/12/2021 1351   GFRNONAA 27 (L) 12/12/2021 1151   GFRAA (L) 03/11/2009 0520    51        The eGFR has been calculated using the MDRD equation. This calculation has not been validated in all clinical situations. eGFR's persistently <60 mL/min signify possible Chronic Kidney Disease.    Imaging: DG Chest 2 View  Result Date: 12/12/2021 CLINICAL DATA:  Shortness of breath EXAM: CHEST - 2 VIEW COMPARISON:  Chest x-ray dated March 11, 2009 FINDINGS: Cardiac and mediastinal contours are within normal limits. Mild scattered linear opacities, likely due to atelectasis. No large pleural effusion or pneumothorax. IMPRESSION: No active cardiopulmonary disease. Electronically Signed   By: Yetta Glassman M.D.   On: 12/12/2021 12:14    EKG: personally reviewed my interpretation is sinus rhythm   ASSESSMENT & PLAN:    Assessment & Plan by Problem:  Jawana Reagor is a 87 y.o. with pertinent PMH of DMII, HTN, obesity,  CKD, and chronic lower extremity edema is presenting with a 1 week of dyspnea with associated cough admitted for possible heart failure exacerbation caused by underlying sepsis.  #Lactic acidosis #Suspected Sepsis Patient is febrile, with an elevated WBC to 19, and short of breath with a cough and a lactic acidosis. She is tachycardic, tachypneic, and has an AKI so she meets criteria for sepsis however no clear source at this time. Given cough, shortness of breath, and new oxygen requirement of 4L favor a respiratory cause, however CXR does not show any infiltrates or edema. Not grossly overloaded on exam. BNP is elevated to 490 from an unclear baseline. Does have some lower extremity edema but patient says that this normal for her. No JVD. Troponins are mildly elevated but flat likely due to demand ischemia. She is on lasix though has not been adherent to her medication. It is possible that an underlying infection has caused a minor worsening of her an underlying heart failure but that is likely not the main driver of her current presentation. Can consider CTAP however given AKI will hold off at this time. If patient does not improve on antibiotics can proceed with further imaging. - telemetry - f/u respiratory viral panel - f/u echo - f/u urine culture, f/u blood culture - continue vanc; switch aztreonam to ceftriaxone - trend lactic acid - lactated ringers infusion 100/hr - daily CBC, BMP - continue metoprolol 100 daily - albuterol PRN - wean oxygen as able  #AKI on CKD Patient's baseline Cr appears to be about 1.3. Elevated to 1.8 on admission. - avoid nephrotoxic medications - fluids as above  #DMII Last A1c in November was 7.4. Takes insulin and ozempic at home. - semglee 25 units and novolog 6 TID with meals - SSI - holding gabapentin  #HTN - Continue home amlodipine 5  #HLD - continue home lipitor 20 daily  #Depression - Continue sertraline 100 mg daily  Diet: Heart  Healthy VTE: Enoxaparin IVF: LR,100cc/hr  Code: DNR/DNI  Prior to Admission Living Arrangement: Home, living with family Anticipated Discharge Location: Home Barriers to Discharge: medical workup  Dispo: Admit patient to Inpatient with expected length of stay greater than 2 midnights.  Signed: [name], MD Internal Medicine Resident PGY-1 Pager: [phone]  12/12/2021, 4:54 PM

## 2021-12-12 NOTE — ED Triage Notes (Signed)
Pt to triage via [facility] from home.  SOB since Thursday with nausea, diarrhea and feeling cold. Negative home COVID test.    EMS-  Sats 87% on Room air.  Respirations 40 and labored. Albuterol 10mg / Atrovent 0.5mg  Solu-Medrol 125 mg Mag 2 g IV- 20g RAC Sats now 99% during breathing neb  Last Tylenol around 6am.  States she has been taking 2 Tylenol every 8 hours.

## 2021-12-12 NOTE — ED Provider Notes (Signed)
[facility] EMERGENCY DEPARTMENT Provider Note   CSN: [id number] Arrival date & time: 12/12/21  1132     History Chief Complaint  Patient presents with   Shortness of Breath   Fever    Katelyn Mahoney is a 87 y.o. female.  This is a 87 y.o. female with significant medical history as below, including diabetes mellitus, hyperlipidemia, hypertension who presents to the ED with complaint of difficulty breathing.  Patient reports onset of symptoms approximately 5 days ago and have been gradually worsening.  Generalized fatigue, malaise, fever subjective, chills, poor appetite, productive cough with white/clear sputum.  Intermittent diarrhea that is nonbloody, nonmelanotic.  No change to urination.  Patient does not wear home oxygen.  Non-smoker, no COPD or asthma history.  No OSA.  Worsening exertional dyspnea in the past 24-48 hrs.  Unable to ambulate more than 5 to 10 feet without becoming severely dyspneic.  She also endorses new orthopnea.  No chest pain, no recent travel.  She is up-to-date on her COVID-19 immunizations.  No sick contacts.  The history is provided by the patient. No language interpreter was used.  Shortness of Breath Associated symptoms: cough and fever   Associated symptoms: no abdominal pain, no chest pain, no headaches and no rash   Fever Associated symptoms: chills, congestion, cough and diarrhea   Associated symptoms: no chest pain, no dysuria, no headaches, no nausea and no rash       Past Medical History:  Diagnosis Date   Allergy    Anxiety    Arthritis    Arthritis    Callus    Depression    Diabetes mellitus without complication ([place])    Hyperlipidemia    Hypertension     Patient Active Problem List   Diagnosis Date Noted   Primary osteoarthritis of both knees 03/09/2021   Urinary frequency 03/09/2021   Depression, major, single episode, in partial remission ([place]) 03/09/2021   Edema of lower extremity 08/20/2017    Hyperlipidemia 08/20/2017   CKD stage G3b/A1, GFR 30-44 and albumin creatinine ratio <30 mg/g (HCC) 02/27/2017   Allergic urticaria 08/02/2016   Fungal dermatitis 11/08/2015   Right leg pain 10/14/2015   Physical exam 11/09/2014   Dermatophytosis of nail 09/24/2013   Arthritis    Claw toe (acquired) 04/17/2013   Hammer toe, acquired 04/17/2013   Spherocytosis, hereditary ([place]) 08/30/2011   DM (diabetes mellitus) type II uncontrolled, periph vascular disorder 10/17/2010   MORBID OBESITY 03/19/2008   Primary hypertension 03/19/2008   ROTATOR CUFF SPRAIN AND STRAIN 03/19/2008   Hyperlipidemia associated with type 2 diabetes mellitus ([place]) 09/05/2007   Depression 09/05/2007   ROSACEA 05/19/2007    Past Surgical History:  Procedure Laterality Date   ABDOMINAL HYSTERECTOMY     CHOLECYSTECTOMY     SPLENECTOMY       OB History   No obstetric history on file.     Family History  Problem Relation Age of Onset   Cancer Mother    Diabetes Mother    Cancer Father    Diabetes Father    Cancer Sister    Diabetes Sister    Cancer Brother    Brain cancer Brother    Diabetes Brother    Diabetes Maternal Grandmother    Diabetes Paternal Grandmother    Cancer - Lung Brother    Diabetes Brother     Social History   Tobacco Use   Smoking status: Never   Smokeless tobacco: Never  Substance  Use Topics   Alcohol use: No    Alcohol/week: 0.0 standard drinks   Drug use: No    Home Medications Prior to Admission medications   Medication Sig Start Date End Date Taking? Authorizing Provider  acetaminophen (TYLENOL) 650 MG CR tablet 650 mg every 8 (eight) hours.   Yes [provider]  amLODipine (NORVASC) 5 MG tablet TAKE 1 TABLET DAILY Patient taking differently: Take 5 mg by mouth daily. 08/21/21  Yes Elayne Snare, MD  aspirin 81 MG tablet Take 243 mg by mouth daily.   Yes [provider]  atorvastatin (LIPITOR) 20 MG tablet TAKE 1/2 TABLET ON MONDAY, WEDNESDAY,  [place] Patient taking differently: Take 10 mg by mouth See admin instructions. TAKE 1/2 TABLET ON MONDAY, WEDNESDAY, FRIDAY AND      SUNDAYS 08/21/21  Yes Lowne Chase, Yvonne R, DO  Bilberry, Vaccinium myrtillus, (BILBERRY PO) Take 1 tablet by mouth daily.   Yes [provider]  fluocinonide cream (LIDEX) 7.16 % Apply 1 application topically daily as needed (breakouts).   Yes [provider]  furosemide (LASIX) 40 MG tablet TAKE 1 TABLET DAILY Patient taking differently: Take 40 mg by mouth daily as needed for fluid. 09/18/21  Yes Roma Schanz R, DO  gabapentin (NEURONTIN) 100 MG capsule TAKE 1 CAPSULE BY MOUTH 3 TIMES DAILY Patient taking differently: 100 mg 2 (two) times daily. 06/28/21  Yes Elayne Snare, MD  Glucosamine HCl (GLUCOSAMINE PO) Take 1 tablet by mouth daily.   Yes [provider]  insulin lispro (HUMALOG KWIKPEN) 100 UNIT/ML KwikPen Inject 7-9 units under the skin three times daily before meals. E11.65 Patient taking differently: 7-9 Units 3 (three) times daily. 05/10/21  Yes Elayne Snare, MD  Loratadine 10 MG CAPS Take 10 mg by mouth daily.   Yes [provider]  metoprolol succinate (TOPROL-XL) 100 MG 24 hr tablet TAKE 1 TABLET DAILY WITH ORIMMEDIATELY FOLLOWING A    MEAL Patient taking differently: 100 mg daily. 08/21/21  Yes Roma Schanz R, DO  multivitamin-lutein (OCUVITE-LUTEIN) CAPS capsule Take 1 capsule by mouth daily. 04/07/14  Yes Midge Minium, MD  OZEMPIC, 1 MG/DOSE, 4 MG/3ML SOPN INJECT 1MG  SUBCUTANEOUSLY  ONCE A WEEK. Patient taking differently: Inject 1 mg into the skin once a week. Saturdays 07/31/21  Yes Elayne Snare, MD  sertraline (ZOLOFT) 100 MG tablet TAKE 1 TABLET DAILY Patient taking differently: Take 100 mg by mouth daily. 09/11/21  Yes Lowne Chase, Yvonne R, DO  TOUJEO SOLOSTAR 300 UNIT/ML Solostar Pen INJECT 34 UNITS            SUBCUTANEOUSLY DAILY Patient taking differently: 34 Units daily.  10/09/21  Yes Elayne Snare, MD  Insulin Pen Needle 32G X 4 MM MISC Use on insulin pens 4 times a day 08/10/21   Elayne Snare, MD  mupirocin cream (BACTROBAN) 2 % APPLY 1 APPLICATION        TOPICALLY TWO TIMES A DAY Patient not taking: Reported on 12/12/2021 12/19/20   Elayne Snare, MD  NONFORMULARY OR COMPOUNDED ITEM Lift chair  #1   Dx osteoarthritis knees 03/09/21   Carollee Herter, Alferd Apa, DO  nystatin cream (MYCOSTATIN) Apply 1 application topically 2 (two) times daily. Patient not taking: Reported on 11/16/2021 09/30/19   Saguier, Percell Miller, PA-C  OneTouch Delica Lancets 96V MISC Use to test blood sugar once daily- E11.65 05/10/21   Elayne Snare, MD  [facility] VERIO test strip USE TO TEST  BLOOD SUGAR    TWICE A DAY 11/20/21   Elayne Snare, MD    Allergies    Bupropion hcl, Penicillins, Citalopram hydrobromide, Metformin and related, Zinc, Blue dyes (parenteral), Cobalt, Gold-containing drug products, and Nickel  Review of Systems   Review of Systems  Constitutional:  Positive for appetite change, chills, fatigue and fever. Negative for activity change.  HENT:  Positive for congestion. Negative for facial swelling and trouble swallowing.   Eyes:  Negative for discharge and redness.  Respiratory:  Positive for cough and shortness of breath.   Cardiovascular:  Negative for chest pain and palpitations.  Gastrointestinal:  Positive for diarrhea. Negative for abdominal pain and nausea.  Genitourinary:  Negative for dysuria and flank pain.  Musculoskeletal:  Negative for back pain and gait problem.  Skin:  Negative for pallor and rash.  Neurological:  Negative for syncope and headaches.   Physical Exam Updated Vital Signs BP (!) 141/52    Pulse (!) 109    Temp (!) 101.8 F (38.8 C) (Oral)    Resp (!) 27    LMP  (LMP Unknown)    SpO2 98%   Physical Exam Vitals and nursing note reviewed.  Constitutional:      Appearance: Normal appearance. She is well-developed. She is obese. She is ill-appearing.  She is not diaphoretic.  HENT:     Head: Normocephalic and atraumatic.     Right Ear: External ear normal.     Left Ear: External ear normal.     Nose: Nose normal.     Mouth/Throat:     Mouth: Mucous membranes are moist.  Eyes:     General: No scleral icterus.       Right eye: No discharge.        Left eye: No discharge.  Cardiovascular:     Rate and Rhythm: Regular rhythm. Tachycardia present.     Pulses: Normal pulses.     Heart sounds: Normal heart sounds.  Pulmonary:     Effort: Tachypnea and respiratory distress present.     Breath sounds: Decreased breath sounds and rales present.  Abdominal:     General: Abdomen is flat.     Tenderness: There is no abdominal tenderness.  Musculoskeletal:        General: Normal range of motion.     Cervical back: Normal range of motion.     Right lower leg: Edema (trace) present.     Left lower leg: Edema (trace) present.  Skin:    General: Skin is warm and dry.     Capillary Refill: Capillary refill takes less than 2 seconds.  Neurological:     Mental Status: She is alert and oriented to person, place, and time.     GCS: GCS eye subscore is 4. GCS verbal subscore is 5. GCS motor subscore is 6.  Psychiatric:        Mood and Affect: Mood normal.        Behavior: Behavior normal.    ED Results / Procedures / Treatments   Labs (all labs ordered are listed, but only abnormal results are displayed) Labs Reviewed  BASIC METABOLIC PANEL - Abnormal; Notable for the following components:      Result Value   CO2 19 (*)    Glucose, Bld 236 (*)    BUN 29 (*)    Creatinine, Ser 1.83 (*)    Calcium 8.7 (*)    GFR, Estimated 27 (*)    All other components within  normal limits  CBC WITH DIFFERENTIAL/PLATELET - Abnormal; Notable for the following components:   WBC 19.0 (*)    MCV 103.8 (*)    MCH 34.1 (*)    nRBC 0.3 (*)    Neutro Abs 16.8 (*)    All other components within normal limits  BRAIN NATRIURETIC PEPTIDE - Abnormal; Notable  for the following components:   B Natriuretic Peptide 490.1 (*)    All other components within normal limits  TROPONIN I (HIGH SENSITIVITY) - Abnormal; Notable for the following components:   Troponin I (High Sensitivity) 33 (*)    All other components within normal limits  RESP PANEL BY RT-PCR (FLU A&B, COVID) ARPGX2  CULTURE, BLOOD (ROUTINE X 2)  CULTURE, BLOOD (ROUTINE X 2)  HEPATIC FUNCTION PANEL  URINALYSIS, ROUTINE W REFLEX MICROSCOPIC  LACTIC ACID, PLASMA  LACTIC ACID, PLASMA  I-STAT VENOUS BLOOD GAS, ED  TROPONIN I (HIGH SENSITIVITY)    EKG EKG Interpretation  Date/Time:  Tuesday December 12 2021 11:38:27 EST Ventricular Rate:  114 PR Interval:  164 QRS Duration: 86 QT Interval:  306 QTC Calculation: 421 R Axis:   17 Text Interpretation: Sinus tachycardia with frequent Premature ventricular complexes Possible Anterior infarct , age undetermined Abnormal ECG Confirmed by Wynona Dove ([id number]) on 12/12/2021 12:07:02 PM  Radiology DG Chest 2 View  Result Date: 12/12/2021 CLINICAL DATA:  Shortness of breath EXAM: CHEST - 2 VIEW COMPARISON:  Chest x-ray dated March 11, 2009 FINDINGS: Cardiac and mediastinal contours are within normal limits. Mild scattered linear opacities, likely due to atelectasis. No large pleural effusion or pneumothorax. IMPRESSION: No active cardiopulmonary disease. Electronically Signed   By: Yetta Glassman M.D.   On: 12/12/2021 12:14    Procedures .Critical Care Performed by: Jeanell Sparrow, DO Authorized by: Jeanell Sparrow, DO   Critical care provider statement:    Critical care time (minutes):  39   Critical care time was exclusive of:  Separately billable procedures and treating other patients   Critical care was necessary to treat or prevent imminent or life-threatening deterioration of the following conditions:  Respiratory failure   Critical care was time spent personally by me on the following activities:  Development of treatment plan  with patient or surrogate, discussions with consultants, evaluation of patient's response to treatment, examination of patient, ordering and review of laboratory studies, ordering and review of radiographic studies, ordering and performing treatments and interventions, pulse oximetry, re-evaluation of patient's condition and review of old charts   Care discussed with: admitting provider     Medications Ordered in ED Medications  aspirin tablet 325 mg (325 mg Oral Given 12/12/21 1533)  aztreonam (AZACTAM) 2 g in sodium chloride 0.9 % 100 mL IVPB (2 g Intravenous New Bag/Given 12/12/21 1537)  metroNIDAZOLE (FLAGYL) IVPB 500 mg (500 mg Intravenous New Bag/Given 12/12/21 1539)  vancomycin (VANCOCIN) 2,500 mg in sodium chloride 0.9 % 500 mL IVPB (has no administration in time range)  vancomycin variable dose per unstable renal function (pharmacist dosing) (has no administration in time range)  acetaminophen (TYLENOL) tablet 650 mg (650 mg Oral Given 12/12/21 1151)  lactated ringers bolus 1,000 mL (1,000 mLs Intravenous New Bag/Given 12/12/21 1534)    ED Course  I have reviewed the triage vital signs and the nursing notes.  Pertinent labs & imaging results that were available during my care of the patient were reviewed by me and considered in my medical decision making (see chart for details).    MDM  Rules/Calculators/A&P                          CC: Dyspnea, cough, malaise, fever, chills  This patient complains of above; this involves an extensive number of treatment options and is a complaint that carries with it a high risk of complications and morbidity. Vital signs were reviewed. Serious etiologies considered.  Patient hypoxic, tachypneic in triage.  Placed on 2 L nasal cannula with improvement, uptitrated 4 L nasal cannula with continued improvement. No home O2 use, no CPAP/BIPAP use at home. She would be amenable to BIPAP if needed.    Pt given nebs, mag, steroids pta.  Record review:    Previous records obtained and reviewed   Work up as above, notable for:  Labs & imaging results that were available during my care of the patient were reviewed by me and considered in my medical decision making.   I ordered imaging studies which included chest x-ray and I independently visualized and interpreted imaging which showed no acute process  Patient denies history of congestive heart failure, BNP elevated approximate 490 today, orthopnea.  Concern for underlying CHF.  Also concern for underlying infection, presumed respiratory source although chest x-ray nonacute.  Patient's renal function unable tolerate contrasted CT studies, urinalysis pending at this time.  Broad-spectrum antibiotics ordered after cultures were obtained.  Pt reports she is feeling better after breathing treatments and supplemental oxygen, pt will desaturate when taken off oxygen to the mid 80's pulse ox.    Recommend admission at this time for the above, pt is agreeable.     This chart was dictated using voice recognition software.  Despite best efforts to proofread,  errors can occur which can change the documentation meaning.  Final Clinical Impression(s) / ED Diagnoses Final diagnoses:  Acute respiratory failure with hypoxia (HCC)  NSTEMI (non-ST elevated myocardial infarction) (HCC)  SIRS (systemic inflammatory response syndrome) (HCC)  Acute congestive heart failure, unspecified heart failure type ([place])  AKI (acute kidney injury) [facility])    Rx / DC Orders ED Discharge Orders     None        Jeanell Sparrow, DO 12/12/21 1541

## 2021-12-13 ENCOUNTER — Inpatient Hospital Stay (HOSPITAL_COMMUNITY): Payer: Medicare Other

## 2021-12-13 ENCOUNTER — Encounter (HOSPITAL_COMMUNITY): Payer: Self-pay | Admitting: Student in an Organized Health Care Education/Training Program

## 2021-12-13 DIAGNOSIS — R319 Hematuria, unspecified: Secondary | ICD-10-CM

## 2021-12-13 DIAGNOSIS — R0609 Other forms of dyspnea: Secondary | ICD-10-CM

## 2021-12-13 DIAGNOSIS — Z9189 Other specified personal risk factors, not elsewhere classified: Secondary | ICD-10-CM

## 2021-12-13 DIAGNOSIS — N390 Urinary tract infection, site not specified: Secondary | ICD-10-CM | POA: Diagnosis present

## 2021-12-13 DIAGNOSIS — N179 Acute kidney failure, unspecified: Secondary | ICD-10-CM | POA: Diagnosis present

## 2021-12-13 LAB — BLOOD CULTURE ID PANEL (REFLEXED) - BCID2

## 2021-12-13 LAB — DIFFERENTIAL
Basophils Absolute: 0 10*3/uL (ref 0.0–0.1)
Basophils Relative: 0 %
Eosinophils Absolute: 0 10*3/uL (ref 0.0–0.5)
Eosinophils Relative: 0 %
Lymphocytes Relative: 6 %
Lymphs Abs: 1.6 10*3/uL (ref 0.7–4.0)
Monocytes Absolute: 1 10*3/uL (ref 0.1–1.0)
Monocytes Relative: 4 %
Neutro Abs: 25.3 10*3/uL — ABNORMAL HIGH (ref 1.7–7.7)
Neutrophils Relative %: 90 %

## 2021-12-13 LAB — HEPATIC FUNCTION PANEL
ALT: 29 U/L (ref 0–44)
AST: 24 U/L (ref 15–41)
Albumin: 2.4 g/dL — ABNORMAL LOW (ref 3.5–5.0)
Alkaline Phosphatase: 96 U/L (ref 38–126)
Bilirubin, Direct: 0.3 mg/dL — ABNORMAL HIGH (ref 0.0–0.2)
Indirect Bilirubin: 0.6 mg/dL (ref 0.3–0.9)
Total Bilirubin: 0.9 mg/dL (ref 0.3–1.2)
Total Protein: 6.5 g/dL (ref 6.5–8.1)

## 2021-12-13 LAB — URINE CULTURE: Culture: NO GROWTH

## 2021-12-13 LAB — BASIC METABOLIC PANEL
Anion gap: 8 (ref 5–15)
Anion gap: 6 (ref 5–15)
BUN: 40 mg/dL — ABNORMAL HIGH (ref 8–23)
BUN: 34 mg/dL — ABNORMAL HIGH (ref 8–23)
CO2: 19 mmol/L — ABNORMAL LOW (ref 22–32)
CO2: 16 mmol/L — ABNORMAL LOW (ref 22–32)
Calcium: 8.1 mg/dL — ABNORMAL LOW (ref 8.9–10.3)
Calcium: 6.2 mg/dL — CL (ref 8.9–10.3)
Chloride: 104 mmol/L (ref 98–111)
Chloride: 111 mmol/L (ref 98–111)
Creatinine, Ser: 2.11 mg/dL — ABNORMAL HIGH (ref 0.44–1.00)
Creatinine, Ser: 1.62 mg/dL — ABNORMAL HIGH (ref 0.44–1.00)
GFR, Estimated: 23 mL/min — ABNORMAL LOW (ref >60)
GFR, Estimated: 31 mL/min — ABNORMAL LOW (ref >60)
Glucose, Bld: 462 mg/dL — ABNORMAL HIGH (ref 70–99)
Glucose, Bld: 364 mg/dL — ABNORMAL HIGH (ref 70–99)
Potassium: 5.2 mmol/L — ABNORMAL HIGH (ref 3.5–5.1)
Potassium: 3.9 mmol/L (ref 3.5–5.1)
Sodium: 131 mmol/L — ABNORMAL LOW (ref 135–145)
Sodium: 133 mmol/L — ABNORMAL LOW (ref 135–145)

## 2021-12-13 LAB — CBC WITH DIFFERENTIAL/PLATELET
Abs Immature Granulocytes: 0.27 10*3/uL — ABNORMAL HIGH (ref 0.00–0.07)
Basophils Absolute: 0.1 10*3/uL (ref 0.0–0.1)
Basophils Relative: 0 %
Eosinophils Absolute: 0 10*3/uL (ref 0.0–0.5)
Eosinophils Relative: 0 %
HCT: 39.8 % (ref 36.0–46.0)
Hemoglobin: 12.6 g/dL (ref 12.0–15.0)
Immature Granulocytes: 1 %
Lymphocytes Relative: 6 %
Lymphs Abs: 1.6 10*3/uL (ref 0.7–4.0)
MCH: 34.1 pg — ABNORMAL HIGH (ref 26.0–34.0)
MCHC: 31.7 g/dL (ref 30.0–36.0)
MCV: 107.6 fL — ABNORMAL HIGH (ref 80.0–100.0)
Monocytes Absolute: 1.1 10*3/uL — ABNORMAL HIGH (ref 0.1–1.0)
Monocytes Relative: 4 %
Neutro Abs: 25.8 10*3/uL — ABNORMAL HIGH (ref 1.7–7.7)
Neutrophils Relative %: 89 %
Platelets: 290 10*3/uL (ref 150–400)
RBC: 3.7 MIL/uL — ABNORMAL LOW (ref 3.87–5.11)
RDW: 15 % (ref 11.5–15.5)
Smear Review: ADEQUATE
WBC: 28.9 10*3/uL — ABNORMAL HIGH (ref 4.0–10.5)
nRBC: 0.1 % (ref 0.0–0.2)

## 2021-12-13 LAB — CBC
HCT: 39.3 % (ref 36.0–46.0)
Hemoglobin: 12.7 g/dL (ref 12.0–15.0)
MCH: 34.4 pg — ABNORMAL HIGH (ref 26.0–34.0)
MCHC: 32.3 g/dL (ref 30.0–36.0)
MCV: 106.5 fL — ABNORMAL HIGH (ref 80.0–100.0)
Platelets: 273 10*3/uL (ref 150–400)
RBC: 3.69 MIL/uL — ABNORMAL LOW (ref 3.87–5.11)
RDW: 15.1 % (ref 11.5–15.5)
WBC: 28.5 10*3/uL — ABNORMAL HIGH (ref 4.0–10.5)
nRBC: 0.1 % (ref 0.0–0.2)

## 2021-12-13 LAB — MRSA NEXT GEN BY PCR, NASAL: MRSA by PCR Next Gen: NOT DETECTED

## 2021-12-13 LAB — ECHOCARDIOGRAM COMPLETE
Calc EF: 50.1 %
MV M vel: 4.83 m/s
MV Peak grad: 93.1 mmHg
Radius: 0.3 cm
S' Lateral: 3.9 cm
Single Plane A2C EF: 50.8 %
Single Plane A4C EF: 51.2 %
Weight: 4617.31 oz

## 2021-12-13 LAB — CBG MONITORING, ED
Glucose-Capillary: 300 mg/dL — ABNORMAL HIGH (ref 70–99)
Glucose-Capillary: 413 mg/dL — ABNORMAL HIGH (ref 70–99)
Glucose-Capillary: 303 mg/dL — ABNORMAL HIGH (ref 70–99)
Glucose-Capillary: 235 mg/dL — ABNORMAL HIGH (ref 70–99)

## 2021-12-13 LAB — GLUCOSE, CAPILLARY
Glucose-Capillary: 158 mg/dL — ABNORMAL HIGH (ref 70–99)
Glucose-Capillary: 183 mg/dL — ABNORMAL HIGH (ref 70–99)
Glucose-Capillary: 191 mg/dL — ABNORMAL HIGH (ref 70–99)

## 2021-12-13 LAB — PROCALCITONIN: Procalcitonin: 105.68 ng/mL

## 2021-12-13 LAB — LACTIC ACID, PLASMA: Lactic Acid, Venous: 1.2 mmol/L (ref 0.5–1.9)

## 2021-12-13 MED ORDER — LACTATED RINGERS IV SOLN
INTRAVENOUS | Status: DC
Start: 2021-12-13 — End: 2021-12-13

## 2021-12-13 MED ORDER — POLYVINYL ALCOHOL 1.4 % OP SOLN: 1.0000 [drp] | OPHTHALMIC | Status: DC | PRN

## 2021-12-13 MED ORDER — INSULIN GLARGINE-YFGN 100 UNIT/ML ~~LOC~~ SOLN
35.0000 [IU] | Freq: Every day | SUBCUTANEOUS | Status: DC
  Administered 2021-12-13 – 2021-12-26 (×14): 35 [IU] via SUBCUTANEOUS
  Filled 2021-12-13 (×16): qty 0.35

## 2021-12-13 MED ORDER — INSULIN GLARGINE-YFGN 100 UNIT/ML ~~LOC~~ SOLN
10.0000 [IU] | Freq: Once | SUBCUTANEOUS | Status: AC
  Administered 2021-12-13: 08:00:00 10 [IU] via SUBCUTANEOUS
  Filled 2021-12-13: qty 0.1

## 2021-12-13 MED ORDER — LACTATED RINGERS IV SOLN: INTRAVENOUS | Status: AC

## 2021-12-13 MED ORDER — MELATONIN 3 MG PO TABS
3.0000 mg | ORAL_TABLET | Freq: Every day | ORAL | Status: DC
  Administered 2021-12-13 – 2021-12-26 (×14): 3 mg via ORAL
  Filled 2021-12-13 (×14): qty 1

## 2021-12-13 MED ORDER — CALCIUM CARBONATE ANTACID 500 MG PO CHEW
1.0000 | CHEWABLE_TABLET | Freq: Every day | ORAL | Status: DC
  Administered 2021-12-13: 12:00:00 200 mg via ORAL
  Filled 2021-12-13: qty 1

## 2021-12-13 MED ORDER — PHENOL 1.4 % MT LIQD
1.0000 | OROMUCOSAL | Status: DC | PRN
  Administered 2021-12-13: 20:00:00 1 via OROMUCOSAL
  Filled 2021-12-13: qty 177

## 2021-12-13 MED ORDER — INSULIN ASPART 100 UNIT/ML IJ SOLN: 6.0000 [IU] | Freq: Once | INTRAMUSCULAR | Status: DC

## 2021-12-13 MED ORDER — VITAMIN B-12 1000 MCG PO TABS
1000.0000 ug | ORAL_TABLET | Freq: Every day | ORAL | Status: DC
  Administered 2021-12-13 – 2021-12-27 (×15): 1000 ug via ORAL
  Filled 2021-12-13 (×15): qty 1

## 2021-12-13 NOTE — ED Notes (Signed)
Patient transported to Ultrasound 

## 2021-12-13 NOTE — Progress Notes (Signed)
PHARMACY - PHYSICIAN COMMUNICATION CRITICAL VALUE ALERT - BLOOD CULTURE IDENTIFICATION (BCID)  Katelyn Mahoney is an 87 y.o. female who presented to [facility] on 12/12/2021 with a chief complaint of SOB  Assessment:  Blood cultures with proteus species in 1/4 bottles. No resistance detected. She is noted with suspected UTI/sepsis  Current antibiotics: Rocephin, vancomycin  Changes to prescribed antibiotics recommended: Discontinue vancomycin, continue rocephin -Per MD request, team to review in the morning and consider discontinuing vancomycin   Results for orders placed or performed during the hospital encounter of 12/12/21  Blood Culture ID Panel (Reflexed) (Collected: 12/12/2021  3:15 PM)  Result Value Ref Range   Enterococcus faecalis NOT DETECTED NOT DETECTED   Enterococcus Faecium NOT DETECTED NOT DETECTED   Listeria monocytogenes NOT DETECTED NOT DETECTED   Staphylococcus species NOT DETECTED NOT DETECTED   Staphylococcus aureus (BCID) NOT DETECTED NOT DETECTED   Staphylococcus epidermidis NOT DETECTED NOT DETECTED   Staphylococcus lugdunensis NOT DETECTED NOT DETECTED   Streptococcus species NOT DETECTED NOT DETECTED   Streptococcus agalactiae NOT DETECTED NOT DETECTED   Streptococcus pneumoniae NOT DETECTED NOT DETECTED   Streptococcus pyogenes NOT DETECTED NOT DETECTED   A.calcoaceticus-baumannii NOT DETECTED NOT DETECTED   Bacteroides fragilis NOT DETECTED NOT DETECTED   Enterobacterales DETECTED (A) NOT DETECTED   Enterobacter cloacae complex NOT DETECTED NOT DETECTED   Escherichia coli NOT DETECTED NOT DETECTED   Klebsiella aerogenes NOT DETECTED NOT DETECTED   Klebsiella oxytoca NOT DETECTED NOT DETECTED   Klebsiella pneumoniae NOT DETECTED NOT DETECTED   Proteus species DETECTED (A) NOT DETECTED   Salmonella species NOT DETECTED NOT DETECTED   Serratia marcescens NOT DETECTED NOT DETECTED   Haemophilus influenzae NOT DETECTED NOT DETECTED   Neisseria  meningitidis NOT DETECTED NOT DETECTED   Pseudomonas aeruginosa NOT DETECTED NOT DETECTED   Stenotrophomonas maltophilia NOT DETECTED NOT DETECTED   Candida albicans NOT DETECTED NOT DETECTED   Candida auris NOT DETECTED NOT DETECTED   Candida glabrata NOT DETECTED NOT DETECTED   Candida krusei NOT DETECTED NOT DETECTED   Candida parapsilosis NOT DETECTED NOT DETECTED   Candida tropicalis NOT DETECTED NOT DETECTED   Cryptococcus neoformans/gattii NOT DETECTED NOT DETECTED   CTX-M ESBL NOT DETECTED NOT DETECTED   Carbapenem resistance IMP NOT DETECTED NOT DETECTED   Carbapenem resistance KPC NOT DETECTED NOT DETECTED   Carbapenem resistance NDM NOT DETECTED NOT DETECTED   Carbapenem resist OXA 48 LIKE NOT DETECTED NOT DETECTED   Carbapenem resistance VIM NOT DETECTED NOT DETECTED    Hildred Laser, PharmD Clinical Pharmacist **Pharmacist phone directory can now be found on [email or web address] ([email or web address]).  Listed under [place].

## 2021-12-13 NOTE — Progress Notes (Signed)
Patient evaluated this afternoon.  Notes she has been having loose stools about 4 episodes today.  Notes that she has been having about 5-6 loose stools per day for the last 3 months.  She associates this with increased dose of Ozempic around the same time.  Feels that her symptoms usually improve a few days prior giving herself her next dose of Ozempic.  Otherwise states continues to feel similar to this morning.  She continues to have a cough with producing a small amount of white sputum with no blood.  She denies any abdominal pain.  Exam with some mild crackles of bilateral lower lung fields but no wheezing.  She has no abdominal pain, bowel sounds are regular, no guarding or rebound.  Given her significantly elevated leukocytosis of 28 and multiple episodes of liquid stools during this admission will check a C. difficile.  We will continue her on vancomycin and ceftriaxone in addition to IV fluids and continue to monitor closely.

## 2021-12-13 NOTE — Plan of Care (Signed)

## 2021-12-13 NOTE — ED Notes (Signed)
Pt was incontinent of BM, I cleaned pt and applied a clean brief.

## 2021-12-13 NOTE — Progress Notes (Signed)
Inpatient Diabetes Program Recommendations  AACE/ADA: New Consensus Statement on Inpatient Glycemic Control (2015)  Target Ranges:  Prepandial:   less than 140 mg/dL      Peak postprandial:   less than 180 mg/dL (1-2 hours)      Critically ill patients:  140 - 180 mg/dL   Lab Results  Component Value Date   GLUCAP 413 (H) 12/13/2021   HGBA1C 7.4 (H) 11/13/2021    Review of Glycemic Control  Latest Reference Range & Units 12/12/21 18:23 12/13/21 07:08 12/13/21 08:01  Glucose-Capillary 70 - 99 mg/dL 283 (H) 300 (H) 413 (H)  (H): Data is abnormally high  Diabetes history: DM2 Outpatient Diabetes medications: Toujeo 34 QD, Humalog 7-9 units TID, Ozempic 1 mg weekly Current orders for Inpatient glycemic control: Semglee 25 QHS, Novolog 0-15 units TID, Novolog 6 units TID  Inpatient Diabetes Program Recommendations:    Semglee 34 units QHS, (home dose) Novolog 0-5 QHS  Will continue to follow while inpatient.  Thank you, Reche Dixon, RN, BSN Diabetes Coordinator Inpatient Diabetes Program [phone] (team pager from 8a-5p)

## 2021-12-13 NOTE — Progress Notes (Signed)
HD#1 Subjective:  Overnight Events: NAEO   Says her breathing is doing better, she was having a lot of trouble breathing when she first came in but she is doing better now. She was surprised her sugars were so high today. Otherwise she is doing ok.  Objective:  Vital signs in last 24 hours: Vitals:   12/13/21 0300 12/13/21 0600 12/13/21 0656 12/13/21 0700  BP: 126/64 (!) 143/80  (!) 136/107  Pulse: 80 86  85  Resp: (!) 21 (!) 26  (!) 23  Temp:   97.6 F (36.4 C)   TempSrc:      SpO2: 98% 99%  100%   Supplemental O2: Nasal Cannula SpO2: 100 % O2 Flow Rate (L/min): 3.5 L/min   Physical Exam:  Constitutional: obese elderly woman resting comfortably in bed, in no acute distress  Cardiovascular: regular rate and rhythm, no m/r/g,1+ pitting edema of the BLE Pulmonary/Chest: normal WOB on 3 L [place]. Slightly less tachypneic than yesterday, expiratory wheezing Abdominal: soft, non-tender, distended MSK: normal bulk and tone Neurological: alert & oriented x 3, mild resting tremor of the left hand Skin: warm and dry Psych: normal affect    There were no vitals filed for this visit.   Intake/Output Summary (Last 24 hours) at 12/13/2021 0741 Last data filed at 12/13/2021 0321 Gross per 24 hour  Intake 1800.92 ml  Output --  Net 1800.92 ml   Net IO Since Admission: 1,800.92 mL [12/13/21 0741]  Pertinent Labs: CBC Latest Ref Rng & Units 12/13/2021 12/12/2021 09/11/2021  WBC 4.0 - 10.5 K/uL 28.5(H) 19.0(H) 12.8(H)  Hemoglobin 12.0 - 15.0 g/dL 12.7 14.3 14.8  Hematocrit 36.0 - 46.0 % 39.3 43.5 44.8  Platelets 150 - 400 K/uL 273 286 303.0    CMP Latest Ref Rng & Units 12/13/2021 12/12/2021 11/13/2021  Glucose 70 - 99 mg/dL 462(H) 236(H) 165(H)  BUN 8 - 23 mg/dL 40(H) 29(H) 25(H)  Creatinine 0.44 - 1.00 mg/dL 2.11(H) 1.83(H) 1.35(H)  Sodium 135 - 145 mmol/L 131(L) 138 141  Potassium 3.5 - 5.1 mmol/L 5.2(H) 4.0 4.3  Chloride 98 - 111 mmol/L 104 106 106  CO2 22 - 32 mmol/L  19(L) 19(L) 22  Calcium 8.9 - 10.3 mg/dL 8.1(L) 8.7(L) 9.1  Total Protein 6.5 - 8.1 g/dL - 6.2(L) -  Total Bilirubin 0.3 - 1.2 mg/dL - 1.8(H) -  Alkaline Phos 38 - 126 U/L - 92 -  AST 15 - 41 U/L - 49(H) -  ALT 0 - 44 U/L - 31 -    Imaging: DG Chest 2 View  Result Date: 12/12/2021 CLINICAL DATA:  Shortness of breath EXAM: CHEST - 2 VIEW COMPARISON:  Chest x-ray dated March 11, 2009 FINDINGS: Cardiac and mediastinal contours are within normal limits. Mild scattered linear opacities, likely due to atelectasis. No large pleural effusion or pneumothorax. IMPRESSION: No active cardiopulmonary disease. Electronically Signed   By: Yetta Glassman M.D.   On: 12/12/2021 12:14    Assessment/Plan:   Principal Problem:   Sepsis [facility])   Patient Summary: Katelyn Mahoney is a 87 y.o. with pertinent PMH of DMII, HTN, obesity, CKD, and chronic lower extremity edema is presenting with a 1 week of dyspnea with associated cough admitted for possible heart failure exacerbation caused by underlying sepsis.   #Suspected UTI; concern for sepsis #AKI on CKD Patient received IV fluids and antibiotics yesterday and lactic acid normalized, however WBC continues to increase to 28.5 today. Patient's baseline Cr appears to be about 1.3.  Elevated to 1.8 on admission, this increased to 2.1 but is down to 1.6 now. She remains hemodynamically stable on 3-4L . Her tachycardia has improved after fluids. No cultures have yet resulted. Will continue with current course of antibiotics at this time. RVP negative and patient's shortness of breath has improved. Suspect that UTI may be underlying cause of infection. Renal ultrasound showed no hydronephrosis but did show evidence of medical renal disease - f/u urine culture, f/u blood culture - procalcitonin 105 - d/c vanc; continue ceftriaxone - daily CBC, BMP - albuterol PRN - avoid nephrotoxic medications    #Possible heart failure exacerbation Patient short of breath  with LEE and a history of lasix on home medications though no clear documentation for why she takes this medication. Given shortness of breath in the setting of acute infection as above will get echo to assess for possible HF exacerbation - albuterol as needed for wheezing - wean oxygen as able - telemetry - f/u echo - continue metoprolol 100 daily  #DMII Last A1c in November was 7.4. Takes insulin and ozempic at home. Given patient's poor PO intake backed off her long acting dose on admission but sugars spiked up to 462. Gave 10 more units of semglee this AM. - semglee 25 units and novolog 6 TID with meals; may need to increase mealtime as well - SSI - holding gabapentin  #Hypocalcemia  Correct Ca 7.5 - 200 mg calcium carbonate daily  #HTN - Continue home amlodipine 5   #HLD - continue home lipitor 20 daily   #Depression - Continue sertraline 100 mg daily   B12 Deficiency  Low at 158. Will start oral supplementation  Diet: Heart Healthy VTE: Enoxaparin IVF: LR,100cc/hr Code: DNR/DNI  [name], MD Internal Medicine Resident PGY-1 Pager [phone] Please contact the on call pager after 5 pm and on weekends at [phone].

## 2021-12-13 NOTE — ED Notes (Signed)
Admitting paged to RN per her request 

## 2021-12-13 NOTE — Progress Notes (Signed)
°  Echocardiogram 2D Echocardiogram has been performed.  Fidel Levy 12/13/2021, 3:52 PM

## 2021-12-14 LAB — CBC WITH DIFFERENTIAL/PLATELET
Abs Immature Granulocytes: 0.25 10*3/uL — ABNORMAL HIGH (ref 0.00–0.07)
Basophils Absolute: 0.1 10*3/uL (ref 0.0–0.1)
Basophils Relative: 0 %
Eosinophils Absolute: 0 10*3/uL (ref 0.0–0.5)
Eosinophils Relative: 0 %
HCT: 38.7 % (ref 36.0–46.0)
Hemoglobin: 12.7 g/dL (ref 12.0–15.0)
Immature Granulocytes: 1 %
Lymphocytes Relative: 7 %
Lymphs Abs: 2 10*3/uL (ref 0.7–4.0)
MCH: 34.4 pg — ABNORMAL HIGH (ref 26.0–34.0)
MCHC: 32.8 g/dL (ref 30.0–36.0)
MCV: 104.9 fL — ABNORMAL HIGH (ref 80.0–100.0)
Monocytes Absolute: 2.7 10*3/uL — ABNORMAL HIGH (ref 0.1–1.0)
Monocytes Relative: 9 %
Neutro Abs: 23.7 10*3/uL — ABNORMAL HIGH (ref 1.7–7.7)
Neutrophils Relative %: 83 %
Platelets: 284 10*3/uL (ref 150–400)
RBC: 3.69 MIL/uL — ABNORMAL LOW (ref 3.87–5.11)
RDW: 14.9 % (ref 11.5–15.5)
WBC: 28.7 10*3/uL — ABNORMAL HIGH (ref 4.0–10.5)
nRBC: 0.2 % (ref 0.0–0.2)

## 2021-12-14 LAB — GLUCOSE, CAPILLARY
Glucose-Capillary: 154 mg/dL — ABNORMAL HIGH (ref 70–99)
Glucose-Capillary: 148 mg/dL — ABNORMAL HIGH (ref 70–99)
Glucose-Capillary: 219 mg/dL — ABNORMAL HIGH (ref 70–99)
Glucose-Capillary: 222 mg/dL — ABNORMAL HIGH (ref 70–99)
Glucose-Capillary: 208 mg/dL — ABNORMAL HIGH (ref 70–99)

## 2021-12-14 LAB — COMPREHENSIVE METABOLIC PANEL
ALT: 21 U/L (ref 0–44)
AST: 19 U/L (ref 15–41)
Albumin: 2.4 g/dL — ABNORMAL LOW (ref 3.5–5.0)
Alkaline Phosphatase: 85 U/L (ref 38–126)
Anion gap: 11 (ref 5–15)
BUN: 53 mg/dL — ABNORMAL HIGH (ref 8–23)
CO2: 17 mmol/L — ABNORMAL LOW (ref 22–32)
Calcium: 8.6 mg/dL — ABNORMAL LOW (ref 8.9–10.3)
Chloride: 108 mmol/L (ref 98–111)
Creatinine, Ser: 1.84 mg/dL — ABNORMAL HIGH (ref 0.44–1.00)
GFR, Estimated: 27 mL/min — ABNORMAL LOW (ref >60)
Glucose, Bld: 172 mg/dL — ABNORMAL HIGH (ref 70–99)
Potassium: 4.9 mmol/L (ref 3.5–5.1)
Sodium: 136 mmol/L (ref 135–145)
Total Bilirubin: 0.6 mg/dL (ref 0.3–1.2)
Total Protein: 6.6 g/dL (ref 6.5–8.1)

## 2021-12-14 MED ORDER — SODIUM CHLORIDE 0.9 % IV SOLN
2.0000 g | INTRAVENOUS | Status: DC
  Administered 2021-12-14: 2 g via INTRAVENOUS
  Filled 2021-12-14: qty 20

## 2021-12-14 NOTE — Progress Notes (Addendum)
HD#2 Subjective:  Overnight Events: Got an albuterol treatment overnight for SOB   Patient is complaining of some burning with urination. She says this wasn't there before but she definitely notices it now. She was feeling short of breath last night but the breathing treatment helped.  Objective:  Vital signs in last 24 hours: Vitals:   12/13/21 2051 12/14/21 0433 12/14/21 0435 12/14/21 0834  BP: (!) 156/69 106/60 106/60 (!) 128/49  Pulse: 85 94 96 69  Resp:    16  Temp: 98 F (36.7 C) 98.2 F (36.8 C)  98.2 F (36.8 C)  TempSrc: Oral Oral  Oral  SpO2: 97% 94% 96% 96%  Weight:  (!) 137.6 kg    Height:       Supplemental O2: Nasal Cannula SpO2: 96 % O2 Flow Rate (L/min): 2 L/min   Physical Exam:  Constitutional: obese elderly woman resting comfortably in bed, in no acute distress  Cardiovascular: regular rate and rhythm, no m/r/g, trace pitting edema of the BLE Pulmonary/Chest: normal WOB on 2 L [place]. Expiratory wheezing Abdominal: soft, non-tender, distended MSK: normal bulk and tone Neurological: alert & oriented x 3, mild resting tremor of the left hand Skin: warm and dry Psych: normal affect    Filed Weights   12/13/21 0900 12/13/21 1652 12/14/21 0433  Weight: 130.9 kg 130.9 kg (!) 137.6 kg     Intake/Output Summary (Last 24 hours) at 12/14/2021 0944 Last data filed at 12/14/2021 0937 Gross per 24 hour  Intake 957 ml  Output 551 ml  Net 406 ml   Net IO Since Admission: 2,206.92 mL [12/14/21 0944]  Pertinent Labs: CBC Latest Ref Rng & Units 12/14/2021 12/13/2021 12/13/2021  WBC 4.0 - 10.5 K/uL 28.7(H) 28.9(H) 28.5(H)  Hemoglobin 12.0 - 15.0 g/dL 12.7 12.6 12.7  Hematocrit 36.0 - 46.0 % 38.7 39.8 39.3  Platelets 150 - 400 K/uL 284 290 273    CMP Latest Ref Rng & Units 12/14/2021 12/13/2021 12/13/2021  Glucose 70 - 99 mg/dL 172(H) - 364(H)  BUN 8 - 23 mg/dL 53(H) - 34(H)  Creatinine 0.44 - 1.00 mg/dL 1.84(H) - 1.62(H)  Sodium 135 - 145 mmol/L 136 -  133(L)  Potassium 3.5 - 5.1 mmol/L 4.9 - 3.9  Chloride 98 - 111 mmol/L 108 - 111  CO2 22 - 32 mmol/L 17(L) - 16(L)  Calcium 8.9 - 10.3 mg/dL 8.6(L) - 6.2(LL)  Total Protein 6.5 - 8.1 g/dL 6.6 6.5 -  Total Bilirubin 0.3 - 1.2 mg/dL 0.6 0.9 -  Alkaline Phos 38 - 126 U/L 85 96 -  AST 15 - 41 U/L 19 24 -  ALT 0 - 44 U/L 21 29 -     Assessment/Plan:   Principal Problem:   Urinary tract infection Active Problems:   Diabetes mellitus (HCC)   CKD stage G3b/A1, GFR 30-44 and albumin creatinine ratio <30 mg/g (HCC)   Acute kidney injury superimposed on chronic kidney disease (HCC)   At risk for sepsis   Patient Summary: Katelyn Mahoney is a 87 y.o. with pertinent PMH of DMII, HTN, obesity, CKD, and chronic lower extremity edema is presenting with a 1 week of dyspnea with associated cough admitted for Gram negative rod bacteremia.   #Proteus bacteremia due to UTI #Sepsis ruled in Patient's blood cultures grew proteus species, will follow up on susceptibilities. Can d/c the vanc at this time given the source of the bacteremia, will continue with ceftriaxone for now. Urine culture has not yet resulted but  patient is complaining of dysuria. She is still feeling somewhat short of breath but this improved after breathing treatments. Wheezing but no crackles heard on exam.  - f/u urine culture, f/u blood culture susceptibilities - continue ceftriaxone 2 g daily, planning for 7-day course of antibiotics, will switch to oral as she makes more clinical improvement.  Acute on Chronic Kidney Disease stage 3b Renal function likely worsened by acute infection, dehydration.  Stable to improving with IV fluids and antibiotics.  Renal ultrasound without hydronephrosis, shows medical renal disease.  Avoiding nephrotoxic agents.  Appears euvolemic on exam today.   #Grade II diastolic Dysfunction; elevated pulmonary artery pressures Patient short of breath with LEE and a history of lasix on home medications  though no clear documentation for why she takes this medication. Echo showed EF of 50-55% with low normal function and elevated PAP of 49.1. However patient look euvolemic on exam today with only trace lower extremity edema. May benefit from cardiology as well as lung function testing as an outpatient - albuterol as needed for wheezing - wean oxygen as able - telemetry - continue to hold lasix - continue metoprolol 100 daily   #DMII Last A1c in November was 7.4. Takes insulin and ozempic at home. Back on home dose of long acting insulin and sugars have improved - semglee 35 units and novolog 6 TID with meals - SSI - holding gabapentin   #HTN - Continue home amlodipine 5   #HLD - continue home lipitor 20 daily   #Depression - Continue sertraline 100 mg daily   #B12 Deficiency  Low at 158. Will start oral supplementation  #Hypocalcemia; resolved   Diet: Heart Healthy VTE: Enoxaparin IVF: LR,100cc/hr Code: DNR/DNI    [name], MD Internal Medicine Resident PGY-1 Pager [phone] Please contact the on call pager after 5 pm and on weekends at [phone].

## 2021-12-14 NOTE — Progress Notes (Signed)
Pharmacy note - ceftriaxone Patient found to have proteus bacteremia.  Ceftriaxone dose increased to 2g IV q24h per [facility] protocol.  Heide Guile, PharmD, BCPS-AQ ID Clinical Pharmacist Phone [phone]

## 2021-12-14 NOTE — Progress Notes (Signed)
Patient has an episode of SOB. HOB was elevated [place] and PRN Albuterol neb tx was admninistered with effective results. 99% 02/4L. Patient stated she feels better.  Call bell within reach and will continue to close monitor.

## 2021-12-15 ENCOUNTER — Other Ambulatory Visit (HOSPITAL_COMMUNITY): Payer: Self-pay

## 2021-12-15 ENCOUNTER — Inpatient Hospital Stay (HOSPITAL_COMMUNITY): Payer: Medicare Other

## 2021-12-15 LAB — CBC WITH DIFFERENTIAL/PLATELET
Abs Immature Granulocytes: 0.15 10*3/uL — ABNORMAL HIGH (ref 0.00–0.07)
Basophils Absolute: 0.1 10*3/uL (ref 0.0–0.1)
Basophils Relative: 0 %
Eosinophils Absolute: 0.2 10*3/uL (ref 0.0–0.5)
Eosinophils Relative: 1 %
HCT: 34.7 % — ABNORMAL LOW (ref 36.0–46.0)
Hemoglobin: 11.6 g/dL — ABNORMAL LOW (ref 12.0–15.0)
Immature Granulocytes: 1 %
Lymphocytes Relative: 12 %
Lymphs Abs: 2.4 10*3/uL (ref 0.7–4.0)
MCH: 34.4 pg — ABNORMAL HIGH (ref 26.0–34.0)
MCHC: 33.4 g/dL (ref 30.0–36.0)
MCV: 103 fL — ABNORMAL HIGH (ref 80.0–100.0)
Monocytes Absolute: 1.9 10*3/uL — ABNORMAL HIGH (ref 0.1–1.0)
Monocytes Relative: 9 %
Neutro Abs: 15.8 10*3/uL — ABNORMAL HIGH (ref 1.7–7.7)
Neutrophils Relative %: 77 %
Platelets: 283 10*3/uL (ref 150–400)
RBC: 3.37 MIL/uL — ABNORMAL LOW (ref 3.87–5.11)
RDW: 14.6 % (ref 11.5–15.5)
WBC: 20.5 10*3/uL — ABNORMAL HIGH (ref 4.0–10.5)
nRBC: 0.5 % — ABNORMAL HIGH (ref 0.0–0.2)

## 2021-12-15 LAB — GLUCOSE, CAPILLARY
Glucose-Capillary: 145 mg/dL — ABNORMAL HIGH (ref 70–99)
Glucose-Capillary: 97 mg/dL (ref 70–99)
Glucose-Capillary: 82 mg/dL (ref 70–99)
Glucose-Capillary: 127 mg/dL — ABNORMAL HIGH (ref 70–99)

## 2021-12-15 LAB — TROPONIN I (HIGH SENSITIVITY)
Troponin I (High Sensitivity): 21 ng/L — ABNORMAL HIGH (ref <18)
Troponin I (High Sensitivity): 29 ng/L — ABNORMAL HIGH (ref <18)

## 2021-12-15 LAB — BASIC METABOLIC PANEL
Anion gap: 9 (ref 5–15)
BUN: 54 mg/dL — ABNORMAL HIGH (ref 8–23)
CO2: 19 mmol/L — ABNORMAL LOW (ref 22–32)
Calcium: 8.5 mg/dL — ABNORMAL LOW (ref 8.9–10.3)
Chloride: 108 mmol/L (ref 98–111)
Creatinine, Ser: 1.71 mg/dL — ABNORMAL HIGH (ref 0.44–1.00)
GFR, Estimated: 29 mL/min — ABNORMAL LOW (ref >60)
Glucose, Bld: 196 mg/dL — ABNORMAL HIGH (ref 70–99)
Potassium: 4.8 mmol/L (ref 3.5–5.1)
Sodium: 136 mmol/L (ref 135–145)

## 2021-12-15 MED ORDER — APIXABAN 2.5 MG PO TABS
2.5000 mg | ORAL_TABLET | Freq: Two times a day (BID) | ORAL | Status: DC
  Administered 2021-12-15 – 2021-12-27 (×25): 2.5 mg via ORAL
  Filled 2021-12-15 (×26): qty 1

## 2021-12-15 MED ORDER — METOPROLOL TARTRATE 25 MG PO TABS: 25.0000 mg | ORAL_TABLET | Freq: Four times a day (QID) | ORAL | Status: DC | PRN

## 2021-12-15 MED ORDER — FUROSEMIDE 40 MG PO TABS
40.0000 mg | ORAL_TABLET | Freq: Once | ORAL | Status: AC
  Administered 2021-12-15: 40 mg via ORAL
  Filled 2021-12-15: qty 1

## 2021-12-15 MED ORDER — SALINE SPRAY 0.65 % NA SOLN
1.0000 | NASAL | Status: DC | PRN
  Administered 2021-12-15 – 2021-12-16 (×2): 1 via NASAL
  Filled 2021-12-15: qty 44

## 2021-12-15 MED ORDER — CEFAZOLIN SODIUM-DEXTROSE 2-4 GM/100ML-% IV SOLN
2.0000 g | Freq: Three times a day (TID) | INTRAVENOUS | Status: AC
  Administered 2021-12-15 – 2021-12-19 (×14): 2 g via INTRAVENOUS
  Filled 2021-12-15 (×14): qty 100

## 2021-12-15 NOTE — Evaluation (Signed)
Occupational Therapy Evaluation Patient Details Name: Katelyn Mahoney MRN: [id number] DOB: 02-11-1934 Today's Date: 12/15/2021   History of Present Illness Pt is 87 y/o F presenting with fever and acute hypoxic respiratory failure, found to have UTI. PMH includes DM II, HTN, obesity, CKD, HLD, chronic LE edema.   Clinical Impression   Pt presents with decreased balance, strength, and activity tolerance. Currently requiring Mod A for LB ADLs and Min A for functional transfers. Currently on 4L O2 and noticeably SOB with exertion. Stated she feels weaker than normal and having more trouble breathing. Pt independent at baseline and lives with family who will be available to provide assistance at home. Would likely benefit from [facility] to ensure safe transition home and maximize safety/independence with ADLs in home environment. Will follow acutely.     Recommendations for follow up therapy are one component of a multi-disciplinary discharge planning process, led by the attending physician.  Recommendations may be updated based on patient status, additional functional criteria and insurance authorization.   Follow Up Recommendations  Home health OT    Assistance Recommended at Discharge Intermittent Supervision/Assistance  Functional Status Assessment  Patient has had a recent decline in their functional status and demonstrates the ability to make significant improvements in function in a reasonable and predictable amount of time.  Equipment Recommendations  BSC/3in1 (bariatric)    Recommendations for Other Services       Precautions / Restrictions Precautions Precautions: Fall;Other (comment) Precaution Comments: Monitor vitals Restrictions Weight Bearing Restrictions: No      Mobility Bed Mobility Overal bed mobility: Needs Assistance Bed Mobility: Supine to Sit     Supine to sit: Min assist;HOB elevated          Transfers Overall transfer level: Needs assistance Equipment  used: Rolling walker (2 wheels) Transfers: Sit to/from Stand;Bed to chair/wheelchair/BSC Sit to Stand: Min assist     Step pivot transfers: Min assist            Balance Overall balance assessment: Needs assistance Sitting-balance support: Feet supported Sitting balance-Leahy Scale: Fair     Standing balance support: Bilateral upper extremity supported Standing balance-Leahy Scale: Poor                             ADL either performed or assessed with clinical judgement   ADL Overall ADL's : Needs assistance/impaired Eating/Feeding: Independent   Grooming: Set up;Sitting   Upper Body Bathing: Set up;Sitting   Lower Body Bathing: Moderate assistance;Sitting/lateral leans;Sit to/from stand   Upper Body Dressing : Set up;Sitting   Lower Body Dressing: Moderate assistance;Sitting/lateral leans;Sit to/from stand   Toilet Transfer: Minimal assistance;BSC/3in1;Rolling walker (2 wheels)                   Vision Patient Visual Report: No change from baseline       Perception     Praxis      Pertinent Vitals/Pain Pain Assessment: 0-10 Pain Score: 6  Pain Location: LLE Pain Descriptors / Indicators: Aching Pain Intervention(s): Monitored during session;Repositioned     Hand Dominance Right   Extremity/Trunk Assessment Upper Extremity Assessment Upper Extremity Assessment: Generalized weakness   Lower Extremity Assessment Lower Extremity Assessment: Defer to PT evaluation       Communication Communication Communication: No difficulties   Cognition Arousal/Alertness: Awake/alert Behavior During Therapy: WFL for tasks assessed/performed Overall Cognitive Status: Within Functional Limits for tasks assessed  General Comments  SpO2 min 93% during transfer on 4L. HR max 132 bpm.    Exercises     Shoulder Instructions      Home Living Family/patient expects to be discharged to::  Private residence Living Arrangements: Children Available Help at Discharge: Family;Available PRN/intermittently Type of Home: House Home Access: Stairs to enter [employer] of Steps: 3 Entrance Stairs-Rails: Can reach both;Right;Left Home Layout: One level     Bathroom Shower/Tub: Occupational psychologist: Handicapped height     Home Equipment: Shower seat - built in;Cane - single point;Rollator (4 wheels)          Prior Functioning/Environment Prior Level of Function : Independent/Modified Independent;Driving             Mobility Comments: Cane for mobility in home, rollator in community ADLs Comments: Assist with IADLs        OT Problem List: Decreased strength;Decreased activity tolerance;Impaired balance (sitting and/or standing);Decreased knowledge of use of DME or AE;Cardiopulmonary status limiting activity;Obesity      OT Treatment/Interventions: Self-care/ADL training;Therapeutic exercise;Energy conservation;DME and/or AE instruction;Therapeutic activities;Patient/family education;Balance training    OT Goals(Current goals can be found in the care plan section) Acute Rehab OT Goals Patient Stated Goal: get better OT Goal Formulation: With patient Time For Goal Achievement: 12/29/21 Potential to Achieve Goals: Good  OT Frequency: Min 2X/week   Barriers to D/C:            Co-evaluation              AM-PAC OT "6 Clicks" Daily Activity     Outcome Measure Help from another person eating meals?: None Help from another person taking care of personal grooming?: A Little Help from another person toileting, which includes using toliet, bedpan, or urinal?: A Lot Help from another person bathing (including washing, rinsing, drying)?: A Lot Help from another person to put on and taking off regular upper body clothing?: A Little Help from another person to put on and taking off regular lower body clothing?: A Lot 6 Click Score: 16    End of Session Equipment Utilized During Treatment: Gait belt;Rolling walker (2 wheels);Oxygen Nurse Communication: Mobility status  Activity Tolerance: Patient limited by fatigue Patient left: in chair;with call bell/phone within reach  OT Visit Diagnosis: Unsteadiness on feet (R26.81);Muscle weakness (generalized) (M62.81)                Time: 3428-7681 OT Time Calculation (min): 31 min Charges:  OT General Charges $OT Visit: 1 Visit OT Evaluation $OT Eval Moderate Complexity: 1 Mod OT Treatments $Therapeutic Activity: 8-22 mins  Ines Rebel C, OT/L  Acute Re[facility] [place] 12/15/2021, 11:21 AM

## 2021-12-15 NOTE — Progress Notes (Signed)
HD#3 Subjective:  Overnight Events: NAEO  Patient feeling somewhat short of breath this morning with a cough. The dysuria she is feeling has gotten better. She was not able to get out of bed yesterday and go to the chair but she says she will try again today.   Objective:  Vital signs in last 24 hours: Vitals:   12/14/21 1357 12/14/21 2051 12/15/21 0517 12/15/21 0735  BP: 110/76 (!) 167/81 137/61 (!) 115/52  Pulse: 81 96 87 70  Resp: (!) 24 18 18 16   Temp: 98.4 F (36.9 C) 98.1 F (36.7 C) 97.9 F (36.6 C) 97.6 F (36.4 C)  TempSrc: Oral Oral Oral Oral  SpO2: 96% 94% 96% 96%  Weight:   135 kg   Height:       Supplemental O2: Nasal Cannula SpO2: 96 % O2 Flow Rate (L/min): 4 L/min   Physical Exam:  Constitutional: obese elderly woman resting comfortably in bed, in no acute distress  Cardiovascular:irregularly irregular rhythm, no m/r/g, 1+ pitting edema of the BLE, dependent edema of the upper thighs Pulmonary/Chest: slightly increased WOB on 4 L [place]. Expiratory wheezing Abdominal: soft, non-tender, distended MSK: normal bulk and tone Neurological: alert & oriented x 3, mild resting tremor of the left hand Skin: warm and dry Psych: normal affect  Filed Weights   12/13/21 1652 12/14/21 0433 12/15/21 0517  Weight: 130.9 kg (!) 137.6 kg 135 kg     Intake/Output Summary (Last 24 hours) at 12/15/2021 1033 Last data filed at 12/15/2021 0650 Gross per 24 hour  Intake 820 ml  Output 850 ml  Net -30 ml   Net IO Since Admission: 1,976.92 mL [12/15/21 1033]  Pertinent Labs: CBC Latest Ref Rng & Units 12/15/2021 12/14/2021 12/13/2021  WBC 4.0 - 10.5 K/uL 20.5(H) 28.7(H) 28.9(H)  Hemoglobin 12.0 - 15.0 g/dL 11.6(L) 12.7 12.6  Hematocrit 36.0 - 46.0 % 34.7(L) 38.7 39.8  Platelets 150 - 400 K/uL 283 284 290    CMP Latest Ref Rng & Units 12/15/2021 12/14/2021 12/13/2021  Glucose 70 - 99 mg/dL 196(H) 172(H) -  BUN 8 - 23 mg/dL 54(H) 53(H) -  Creatinine 0.44 - 1.00  mg/dL 1.71(H) 1.84(H) -  Sodium 135 - 145 mmol/L 136 136 -  Potassium 3.5 - 5.1 mmol/L 4.8 4.9 -  Chloride 98 - 111 mmol/L 108 108 -  CO2 22 - 32 mmol/L 19(L) 17(L) -  Calcium 8.9 - 10.3 mg/dL 8.5(L) 8.6(L) -  Total Protein 6.5 - 8.1 g/dL - 6.6 6.5  Total Bilirubin 0.3 - 1.2 mg/dL - 0.6 0.9  Alkaline Phos 38 - 126 U/L - 85 96  AST 15 - 41 U/L - 19 24  ALT 0 - 44 U/L - 21 29    Imaging: No results found.  Assessment/Plan:   Principal Problem:   Urinary tract infection Active Problems:   Diabetes mellitus (HCC)   CKD stage G3b/A1, GFR 30-44 and albumin creatinine ratio <30 mg/g (HCC)   Acute kidney injury superimposed on chronic kidney disease (HCC)   At risk for sepsis   Patient Summary: Katelyn Mahoney is a 87 y.o. with pertinent PMH of DMII, HTN, obesity, CKD, and chronic lower extremity edema is presenting with a 1 week of dyspnea with associated cough admitted for Gram negative rod bacteremia.   #Proteus bacteremia due to UTI #Sepsis ruled in Patient's blood cultures grew proteus species, sensitive to cefazolin. WBC has improved to 20.5. Dysuria has also improved but patient is continuing to have  respiratory symptoms and atrial fibrillation likely related to acute illness. Wheezing but no crackles heard on exam.  - switch to cefazolin 2g Q8, planning for 7-day course of antibiotics (end 12/20), will switch to oral as she makes more clinical improvement. - daily CBC  #Atrial fibrillation #Grade II diastolic Dysfunction; elevated pulmonary artery pressures Patient short of breath with LEE and a history of lasix on home medications though no clear documentation for why she takes this medication. Echo showed EF of 50-55% with low normal function and elevated PAP of 49.1. HR increased to the 110-140s this morning, EKG showing atrial fibrillation. Will start anticoagulation. Given shortness of breath and possible edema seen on CXR will give a dose of lasix and see how she responds  to diuresis. Also encouraged patient to mobilize to the chair. - CHADsVASC 6, HASBLED 2 - eliquis 2.5 BID - incentive spirometry - albuterol as needed for wheezing - wean oxygen as able - telemetry - lasix 40 daily - continue metoprolol 100 daily  Acute on Chronic Kidney Disease stage 3b Renal function likely worsened by acute infection, dehydration. Stable to mildly improving with IV fluids and antibiotics. Renal ultrasound without hydronephrosis, shows medical renal disease. Will continue to monitor closely in the setting of diuresis as above - Avoiding nephrotoxic agents.  - daily bmp  #DMII Last A1c in November was 7.4. Takes insulin and ozempic at home. Back on home dose of long acting insulin and sugars have improved - semglee 35 units and novolog 6 TID with meals - SSI - holding gabapentin   #HTN - Continue home amlodipine 5   #HLD - continue home lipitor 20 daily   #Depression - Continue sertraline 100 mg daily   #B12 Deficiency  Low at 158. Will start oral supplementation   #Hypocalcemia; resolved   Diet: Heart Healthy VTE: Enoxaparin IVF: LR,100cc/hr Code: DNR/DNI    [name], MD Internal Medicine Resident PGY-1 Pager [phone] Please contact the on call pager after 5 pm and on weekends at [phone].

## 2021-12-15 NOTE — Evaluation (Signed)
Physical Therapy Evaluation Patient Details Name: Katelyn Mahoney MRN: [id number] DOB: [birth year] Today's Date: 12/15/2021  History of Present Illness  Pt is 87 y/o F presenting with fever and acute hypoxic respiratory failure, found to have UTI. PMH includes DM II, HTN, obesity, CKD, HLD, chronic LE edema.  Clinical Impression  Pt admitted with above diagnosis. Pt was able to stand to the RW but has little stamina to stand more than a minute.  DOE 3/4 and has to sit due to this.  Desaturate to 86% on 4.5LO2.  Took incr time to recover with pursed lip breathing. Will most likely benefit from SNF to incr endurance.  Pt currently with functional limitations due to the deficits listed below (see PT Problem List). Pt will benefit from skilled PT to increase their independence and safety with mobility to allow discharge to the venue listed below.          Recommendations for follow up therapy are one component of a multi-disciplinary discharge planning process, led by the attending physician.  Recommendations may be updated based on patient status, additional functional criteria and insurance authorization.  Follow Up Recommendations Skilled nursing-short term rehab (<3 hours/day)    Assistance Recommended at Discharge Intermittent Supervision/Assistance  Functional Status Assessment Patient has had a recent decline in their functional status and demonstrates the ability to make significant improvements in function in a reasonable and predictable amount of time.  Equipment Recommendations  None recommended by PT    Recommendations for Other Services       Precautions / Restrictions Precautions Precautions: Fall;Other (comment) Precaution Comments: Monitor vitals- O2 Restrictions Weight Bearing Restrictions: No      Mobility  Bed Mobility Overal bed mobility: Needs Assistance Bed Mobility: Supine to Sit     Supine to sit: Min assist;HOB elevated     General bed mobility comments:  Pt in chair on arrival    Transfers Overall transfer level: Needs assistance Equipment used: Rolling walker (2 wheels) Transfers: Sit to/from Stand;Bed to chair/wheelchair/BSC Sit to Stand: Min assist   Step pivot transfers: Min assist       General transfer comment: Pt stood up with min assist and cues for hand placement. Pt had purewick in and asked PT to leave it as she needs to urinate alot.  The bedside commode int he room was broken and she didnt want me to go try to find another one. Pt did urinate when she stood in the [place].  She took a step or 2 forward and she had DOE 3/4, Sats to 86% on 4.5L and HR to 125 bpm.  Pt then asked to sit down and stated she was "exhausted"    Ambulation/Gait                  Stairs            Wheelchair Mobility    Modified Rankin (Stroke Patients Only)       Balance Overall balance assessment: Needs assistance Sitting-balance support: Feet supported Sitting balance-Leahy Scale: Fair     Standing balance support: Bilateral upper extremity supported Standing balance-Leahy Scale: Poor Standing balance comment: Pt relies heavily on RW for balance and support                             Pertinent Vitals/Pain Pain Assessment: 0-10 Pain Score: 6  Pain Location: LLE Pain Descriptors / Indicators: Aching Pain Intervention(s): Limited activity within patient's tolerance;Monitored  during session;Repositioned    Home Living Family/patient expects to be discharged to:: Private residence Living Arrangements: Children Available Help at Discharge: Family;Available PRN/intermittently Type of Home: House Home Access: Stairs to enter Entrance Stairs-Rails: Can reach both;Right;Left Entrance Stairs-Number of Steps: 3   Home Layout: One level Home Equipment: Shower seat - built in;Cane - single point;Rollator (4 wheels)      Prior Function Prior Level of Function : Independent/Modified Independent;Driving              Mobility Comments: Cane for mobility in home, rollator in community ADLs Comments: Assist with IADLs     Hand Dominance   Dominant Hand: Right    Extremity/Trunk Assessment   Upper Extremity Assessment Upper Extremity Assessment: Defer to OT evaluation    Lower Extremity Assessment Lower Extremity Assessment: Generalized weakness    Cervical / Trunk Assessment Cervical / Trunk Assessment: Kyphotic  Communication   Communication: No difficulties  Cognition Arousal/Alertness: Awake/alert Behavior During Therapy: WFL for tasks assessed/performed Overall Cognitive Status: Within Functional Limits for tasks assessed                                          General Comments General comments (skin integrity, edema, etc.): SpO2 min 93% during transfer on 4L. HR max 132 bpm.    Exercises General Exercises - Lower Extremity Ankle Circles/Pumps: AROM;Both;10 reps;Supine Long Arc Quad: AROM;Both;10 reps;Seated Hip Flexion/Marching: AROM;Both;10 reps;Seated   Assessment/Plan    PT Assessment Patient needs continued PT services  PT Problem List Decreased mobility;Decreased balance;Decreased activity tolerance;Decreased knowledge of use of DME;Decreased safety awareness;Decreased knowledge of precautions;Cardiopulmonary status limiting activity       PT Treatment Interventions DME instruction;Gait training;Functional mobility training;Therapeutic activities;Therapeutic exercise;Balance training;Patient/family education;Stair training    PT Goals (Current goals can be found in the Care Plan section)  Acute Rehab PT Goals Patient Stated Goal: to go homeafter rehab PT Goal Formulation: With patient Time For Goal Achievement: 12/29/21 Potential to Achieve Goals: Good    Frequency Min 3X/week   Barriers to discharge Decreased caregiver support      Co-evaluation               AM-PAC PT "6 Clicks" Mobility  Outcome Measure Help needed  turning from your back to your side while in a flat bed without using bedrails?: A Little Help needed moving from lying on your back to sitting on the side of a flat bed without using bedrails?: A Little Help needed moving to and from a bed to a chair (including a wheelchair)?: A Little Help needed standing up from a chair using your arms (e.g., wheelchair or bedside chair)?: A Little Help needed to walk in hospital room?: Total Help needed climbing 3-5 steps with a railing? : Total 6 Click Score: 14    End of Session Equipment Utilized During Treatment: Gait belt;Oxygen Activity Tolerance: Patient limited by fatigue Patient left: in chair;with call bell/phone within reach;with chair alarm set Nurse Communication: Mobility status PT Visit Diagnosis: Muscle weakness (generalized) (M62.81)    Time: 4665-9935 PT Time Calculation (min) (ACUTE ONLY): 15 min   Charges:   PT Evaluation $PT Eval Moderate Complexity: 1 Mod          Kellyann Ordway M,PT Acute Rehab Services [phone] (pager)   [name] 12/15/2021, 1:51 PM

## 2021-12-15 NOTE — TOC Transition Note (Addendum)
Transition of Care [facility]) - CM/SW Discharge Note   Patient Details  Name: Katelyn Mahoney MRN: [id number] Date of Birth: 29-Sep-1934  [facility] CM/SW Contact:  Marilu Favre, RN Phone Number: 12/15/2021, 11:18 AM   Clinical Narrative:      Awaiting PT/OT evaluations. Weaning oxygen , a fib , lasix   eliquis #60/30 days returns a co-pay of $25.41  Transition of Care Department (TOC) has reviewed patient and no TOC needs have been identified at this time. We will continue to monitor patient advancement through interdisciplinary progression rounds. If new patient transition needs arise , please place a TOC consult               Patient Goals and CMS Choice        Discharge Placement                       Discharge Plan and Services                                     Social Determinants of Health (SDOH) Interventions     Readmission Risk Interventions No flowsheet data found.

## 2021-12-15 NOTE — Progress Notes (Signed)
NT obtained 12 lead and RN called and notified about results.

## 2021-12-15 NOTE — Progress Notes (Signed)
MD paged regarding pt irregular HR. HR in the 110's- 140's non-sustaining. Pt resting calmly in bed. Pt denies chest pain or feeling of heart racing. Pt is SOB with 3.5 [place] O2. Sat 96%. New orders given for 12 lead EKG. Will notify MD once completed.

## 2021-12-16 ENCOUNTER — Inpatient Hospital Stay (HOSPITAL_COMMUNITY): Payer: Medicare Other

## 2021-12-16 LAB — GLUCOSE, CAPILLARY
Glucose-Capillary: 115 mg/dL — ABNORMAL HIGH (ref 70–99)
Glucose-Capillary: 123 mg/dL — ABNORMAL HIGH (ref 70–99)
Glucose-Capillary: 124 mg/dL — ABNORMAL HIGH (ref 70–99)
Glucose-Capillary: 138 mg/dL — ABNORMAL HIGH (ref 70–99)
Glucose-Capillary: 138 mg/dL — ABNORMAL HIGH (ref 70–99)
Glucose-Capillary: 194 mg/dL — ABNORMAL HIGH (ref 70–99)

## 2021-12-16 LAB — BASIC METABOLIC PANEL
Anion gap: 9 (ref 5–15)
BUN: 43 mg/dL — ABNORMAL HIGH (ref 8–23)
CO2: 21 mmol/L — ABNORMAL LOW (ref 22–32)
Calcium: 8.6 mg/dL — ABNORMAL LOW (ref 8.9–10.3)
Chloride: 107 mmol/L (ref 98–111)
Creatinine, Ser: 1.48 mg/dL — ABNORMAL HIGH (ref 0.44–1.00)
GFR, Estimated: 34 mL/min — ABNORMAL LOW (ref >60)
Glucose, Bld: 146 mg/dL — ABNORMAL HIGH (ref 70–99)
Potassium: 4.2 mmol/L (ref 3.5–5.1)
Sodium: 137 mmol/L (ref 135–145)

## 2021-12-16 LAB — CBC
HCT: 35.1 % — ABNORMAL LOW (ref 36.0–46.0)
Hemoglobin: 11.8 g/dL — ABNORMAL LOW (ref 12.0–15.0)
MCH: 34.1 pg — ABNORMAL HIGH (ref 26.0–34.0)
MCHC: 33.6 g/dL (ref 30.0–36.0)
MCV: 101.4 fL — ABNORMAL HIGH (ref 80.0–100.0)
Platelets: 326 10*3/uL (ref 150–400)
RBC: 3.46 MIL/uL — ABNORMAL LOW (ref 3.87–5.11)
RDW: 14.7 % (ref 11.5–15.5)
WBC: 16.6 10*3/uL — ABNORMAL HIGH (ref 4.0–10.5)
nRBC: 0.8 % — ABNORMAL HIGH (ref 0.0–0.2)

## 2021-12-16 MED ORDER — LEVALBUTEROL HCL 0.63 MG/3ML IN NEBU
0.6300 mg | INHALATION_SOLUTION | Freq: Four times a day (QID) | RESPIRATORY_TRACT | Status: DC | PRN
  Administered 2021-12-16 – 2021-12-17 (×2): 0.63 mg via RESPIRATORY_TRACT
  Filled 2021-12-16 (×3): qty 3

## 2021-12-16 MED ORDER — LIP MEDEX EX OINT
1.0000 "application " | TOPICAL_OINTMENT | CUTANEOUS | Status: DC | PRN
  Filled 2021-12-16: qty 7

## 2021-12-16 MED ORDER — FUROSEMIDE 10 MG/ML IJ SOLN
60.0000 mg | Freq: Once | INTRAMUSCULAR | Status: AC
  Administered 2021-12-16: 60 mg via INTRAVENOUS
  Filled 2021-12-16: qty 6

## 2021-12-16 MED ORDER — FUROSEMIDE 40 MG PO TABS
40.0000 mg | ORAL_TABLET | Freq: Once | ORAL | Status: AC
  Administered 2021-12-16: 40 mg via ORAL
  Filled 2021-12-16: qty 1

## 2021-12-16 MED ORDER — LEVALBUTEROL TARTRATE 45 MCG/ACT IN AERO
1.0000 | INHALATION_SPRAY | Freq: Four times a day (QID) | RESPIRATORY_TRACT | Status: DC | PRN
Start: 2021-12-16 — End: 2021-12-16
  Filled 2021-12-16 (×2): qty 15

## 2021-12-16 NOTE — NC FL2 (Signed)
[place] LEVEL OF CARE SCREENING TOOL     IDENTIFICATION  Patient Name: Katelyn Mahoney Birthdate: [birth year] Sex: female Admission Date (Current Location): 12/12/2021  [facility] and [place] Number:  Herbalist and Address:  [facility] [place], [place] [address], [place], [place] [address]      Provider Number: [id number]  Attending Physician Name and Address:  Axel Filler, *  Relative Name and Phone Number:  Sheria Lang [phone]    Current Level of Care: Hospital Recommended Level of Care: [place] Prior Approval Number:    Date Approved/Denied:   PASRR Number: [id number]  Discharge Plan: SNF    Current Diagnoses: Patient Active Problem List   Diagnosis Date Noted   Urinary tract infection 12/13/2021   Acute kidney injury superimposed on chronic kidney disease ([place]) 12/13/2021   At risk for sepsis 12/13/2021   Primary osteoarthritis of both knees 03/09/2021   Urinary frequency 03/09/2021   Depression, major, single episode, in partial remission ([place]) 03/09/2021   Edema of lower extremity 08/20/2017   Hyperlipidemia 08/20/2017   CKD stage G3b/A1, GFR 30-44 and albumin creatinine ratio <30 mg/g (HCC) 02/27/2017   Allergic urticaria 08/02/2016   Fungal dermatitis 11/08/2015   Right leg pain 10/14/2015   Physical exam 11/09/2014   Dermatophytosis of nail 09/24/2013   Arthritis    Claw toe (acquired) 04/17/2013   Hammer toe, acquired 04/17/2013   Spherocytosis, hereditary ([place]) 08/30/2011   Diabetes mellitus ([place]) 10/17/2010   MORBID OBESITY 03/19/2008   Primary hypertension 03/19/2008   ROTATOR CUFF SPRAIN AND STRAIN 03/19/2008   Hyperlipidemia associated with type 2 diabetes mellitus ([place]) 09/05/2007   Depression 09/05/2007   ROSACEA 05/19/2007    Orientation RESPIRATION BLADDER Height & Weight     Self, Time, Situation, Place  O2 ([place] 4 Liters) Incontinent, External catheter Weight: 296  lb 4.8 oz (134.4 kg) Height:  5\' 7"  (170.2 cm)  BEHAVIORAL SYMPTOMS/MOOD NEUROLOGICAL BOWEL NUTRITION STATUS      Continent Diet (See dc summary)  AMBULATORY STATUS COMMUNICATION OF NEEDS Skin   Extensive Assist Verbally Normal                       Personal Care Assistance Level of Assistance  Bathing, Feeding, Dressing Bathing Assistance: Limited assistance Feeding assistance: Independent Dressing Assistance: Limited assistance     Functional Limitations Info  Sight, Hearing, Speech Sight Info: Adequate Hearing Info: Adequate Speech Info: Adequate    SPECIAL CARE FACTORS FREQUENCY  PT (By licensed PT), OT (By licensed OT)     PT Frequency: 5x week OT Frequency: 5x week            Contractures Contractures Info: Not present    Additional Factors Info  Code Status, Allergies, Psychotropic, Insulin Sliding Scale Code Status Info: DNR Allergies Info: Bupropion Hcl   Penicillins   Citalopram Hydrobromide   Metformin And Related   Zinc   Blue Dyes (Parenteral)   [place]   Gold-containing Drug Products   [place] Psychotropic Info: sertraline (ZOLOFT) tablet 100 mg DAILY, Insulin Sliding Scale Info: insulin aspart (novoLOG) injection 0-15 Units 3X DAILY WITH MEALS, insulin aspart (novoLOG) injection 6 Units 3X DAILY WITH MEALS, insulin glargine-yfgn (SEMGLEE) injection 35 Units DAILY AT BEDTIME       Current Medications (12/16/2021):  This is the current hospital active medication list Current Facility-Administered Medications  Medication Dose Route Frequency Provider Last Rate Last Admin   acetaminophen (  TYLENOL) tablet 650 mg  650 mg Oral Q6H PRN Iona Beard, MD   650 mg at 12/14/21 2318   Or   acetaminophen (TYLENOL) suppository 650 mg  650 mg Rectal Q6H PRN Iona Beard, MD       albuterol (PROVENTIL) (2.5 MG/3ML) 0.083% nebulizer solution 3 mL  3 mL Inhalation Q6H PRN Iona Beard, MD   3 mL at 12/15/21 2125   amLODipine (NORVASC) tablet 5 mg  5 mg Oral  Daily Iona Beard, MD   5 mg at 12/16/21 1045   apixaban (ELIQUIS) tablet 2.5 mg  2.5 mg Oral BID Iona Beard, MD   2.5 mg at 12/16/21 1045   aspirin EC tablet 81 mg  81 mg Oral Daily Iona Beard, MD   81 mg at 12/16/21 1045   ceFAZolin (ANCEF) IVPB 2g/100 mL premix  2 g Intravenous Q8H Demaio, Alexa, MD 200 mL/hr at 12/16/21 0512 2 g at 12/16/21 0512   insulin aspart (novoLOG) injection 0-15 Units  0-15 Units Subcutaneous TID [facility] Iona Beard, MD   2 Units at 12/16/21 0831   insulin aspart (novoLOG) injection 6 Units  6 Units Subcutaneous TID WC Iona Beard, MD   6 Units at 12/16/21 0830   insulin glargine-yfgn (SEMGLEE) injection 35 Units  35 Units Subcutaneous QHS Iona Beard, MD   35 Units at 12/15/21 2127   lip balm (CARMEX) ointment 1 application  1 application Topical PRN Axel Filler, MD       melatonin tablet 3 mg  3 mg Oral QHS Timothy Lasso, MD   3 mg at 12/15/21 2127   metoprolol succinate (TOPROL-XL) 24 hr tablet 100 mg  100 mg Oral Daily Iona Beard, MD   100 mg at 12/16/21 1045   metoprolol tartrate (LOPRESSOR) tablet 25 mg  25 mg Oral Q6H PRN Demaio, Alexa, MD       phenol (CHLORASEPTIC) mouth spray 1 spray  1 spray Mouth/Throat PRN Axel Filler, MD   1 spray at 12/13/21 2005   polyvinyl alcohol (LIQUIFILM TEARS) 1.4 % ophthalmic solution 1 drop  1 drop Both Eyes PRN Delene Ruffini, MD       sertraline (ZOLOFT) tablet 100 mg  100 mg Oral Daily Iona Beard, MD   100 mg at 12/16/21 1045   sodium chloride (OCEAN) 0.65 % nasal spray 1 spray  1 spray Each Nare PRN Axel Filler, MD   1 spray at 12/16/21 2641   vitamin B-12 (CYANOCOBALAMIN) tablet 1,000 mcg  1,000 mcg Oral Daily Demaio, Alexa, MD   1,000 mcg at 12/16/21 1045     Discharge Medications: Please see discharge summary for a list of discharge medications.  Relevant Imaging Results:  Relevant Lab Results:   Additional Information SSN [id number]  Loreta Ave, LCSWA

## 2021-12-16 NOTE — Progress Notes (Addendum)
Received page from RN for irregular heart rate 110s-130s and new onset shortness of breath.  RN had previously given Xopenex due to wheezing, however this did not alleviate the shortness of breath.  Evaluated patient at bedside.  She states that her shortness of breath has improved though is still present.  Also states that she has shortness of breath with lying flat, unclear whether this is a new issue for her. Urine output 500 mL in the past 24 hours.  Exam is remarkable for crackles at bilateral lower lung bases.  End expiratory wheezing noted.  Hepatojugular reflux is present.  1+ pitting edema of BLE.  Plan: Per chart review, patient with EF 50-55%.  Possible that her A. fib is exacerbating underlying HFpEF. We will obtain chest x-ray, EKG, and BNP further evaluation.  We will also obtain bladder scan given decreased urine output in the past 24 hours.  Addendum: BNP 717.5, EKG notable for some PVCs, CXR with increasing bilateral airspace opacities. Given IV Lasix 60 mg with 1600 mL urine output.

## 2021-12-16 NOTE — Progress Notes (Signed)
Mobility Specialist Progress Note    12/16/21 1627  Mobility  Activity Transferred:  Bed to chair  Level of Assistance +2 (takes two people)  Games developer wheel walker  Distance Ambulated (ft) 2 ft  Mobility Out of bed to chair with meals  Mobility Response Tolerated fair  Mobility performed by Mobility specialist;Nurse tech  Bed Position Chair  $Mobility charge 1 Mobility   Pt left in chair with call bell in reach.   [facility] Mobility Specialist  M.S. Primary Phone: [phone] M.S. Secondary Phone: [phone]

## 2021-12-16 NOTE — Discharge Summary (Signed)
Name: Katelyn Mahoney MRN: [id number] DOB: 09-22-1934 87 y.o. PCP: Ann Held, DO  Date of Admission: 12/12/2021 11:34 AM Date of Discharge:   12/27/2021 Attending Physician: Velna Ochs, MD  Discharge Diagnosis: 1. Proteus bacteremia due to UTI; Sepsis 2. Atrial fibrillation 3. Grade II diastolic Dysfunction; elevated pulmonary artery pressures 4. Pulmonary edema 5. Acute on Chronic Kidney Disease stage 3b 6. DMII 7. HTN 8. HLD 9. B12 Deficiency   10. Depression 11. Hypocalcemia; resolved  Discharge Medications: Allergies as of 12/27/2021       Reactions   Bupropion Hcl    Palmar rash   Penicillins    rash   Citalopram Hydrobromide Other (See Comments)   Unknown   Metformin And Related Diarrhea   Zinc Other (See Comments)   unknown ...   Blue Dyes (parenteral) Rash   Cobalt Rash   Gold-containing Drug Products Rash   Nickel Rash        Medication List     STOP taking these medications    amLODipine 5 MG tablet Commonly known as: NORVASC   aspirin 81 MG tablet   insulin lispro 100 UNIT/ML KwikPen Commonly known as: HumaLOG KwikPen       TAKE these medications    acetaminophen 650 MG CR tablet Commonly known as: TYLENOL 650 mg every 8 (eight) hours.   apixaban 2.5 MG Tabs tablet Commonly known as: ELIQUIS Take 1 tablet (2.5 mg total) by mouth 2 (two) times daily.   atorvastatin 20 MG tablet Commonly known as: LIPITOR TAKE 1/2 TABLET ON MONDAY, WEDNESDAY, FRIDAY AND      SUNDAYS What changed: See the new instructions.   BILBERRY PO Take 1 tablet by mouth daily.   cyanocobalamin 1000 MCG tablet Take 1 tablet (1,000 mcg total) by mouth daily.   diltiazem 120 MG 24 hr capsule Commonly known as: CARDIZEM CD Take 1 capsule (120 mg total) by mouth daily.   fluocinonide cream 0.05 % Commonly known as: LIDEX Apply 1 application topically daily as needed (breakouts).   furosemide 40 MG tablet Commonly known as:  LASIX TAKE 1 TABLET DAILY What changed:  when to take this reasons to take this   gabapentin 100 MG capsule Commonly known as: NEURONTIN TAKE 1 CAPSULE BY MOUTH 3 TIMES DAILY What changed:  how to take this when to take this   GLUCOSAMINE PO Take 1 tablet by mouth daily.   insulin aspart 100 UNIT/ML injection Commonly known as: novoLOG Inject 6 Units into the skin 3 (three) times daily with meals.   Insulin Pen Needle 32G X 4 MM Misc Use on insulin pens 4 times a day   levalbuterol 0.63 MG/3ML nebulizer solution Commonly known as: XOPENEX Take 3 mLs (0.63 mg total) by nebulization every 6 (six) hours as needed for wheezing or shortness of breath.   Loratadine 10 MG Caps Take 10 mg by mouth daily.   melatonin 3 MG Tabs tablet Take 1 tablet (3 mg total) by mouth at bedtime.   metoprolol succinate 100 MG 24 hr tablet Commonly known as: TOPROL-XL Take 0.5 tablets (50 mg total) by mouth daily. Take with or immediately following a meal. What changed: See the new instructions.   multivitamin-lutein Caps capsule Take 1 capsule by mouth daily.   NONFORMULARY OR COMPOUNDED ITEM Lift chair  #1   Dx osteoarthritis knees   OneTouch Delica Lancets 06T Misc Use to test blood sugar once daily- E11.65   OneTouch Verio test strip Generic drug:  glucose blood USE TO TEST BLOOD SUGAR    TWICE A DAY   Ozempic (1 MG/DOSE) 4 MG/3ML Sopn Generic drug: Semaglutide (1 MG/DOSE) INJECT 1MG SUBCUTANEOUSLY  ONCE A WEEK. What changed: See the new instructions.   pantoprazole 40 MG tablet Commonly known as: PROTONIX Take 1 tablet (40 mg total) by mouth daily.   sertraline 100 MG tablet Commonly known as: ZOLOFT TAKE 1 TABLET DAILY What changed:  how much to take how to take this when to take this additional instructions   Toujeo SoloStar 300 UNIT/ML Solostar Pen Generic drug: insulin glargine (1 Unit Dial) INJECT 34 UNITS            SUBCUTANEOUSLY DAILY What changed: See the  new instructions.               Durable Medical Equipment  (From admission, onward)           Start     Ordered   12/21/21 0924  For home use only DME oxygen  Once       Question Answer Comment  Length of Need 6 Months   Liters per Minute 4   Frequency Continuous (stationary and portable oxygen unit needed)   Oxygen delivery system Gas      12/21/21 0923   12/19/21 1314  For home use only DME 3 n 1  Once        12/19/21 1314   12/19/21 1313  For home use only DME Bedside commode  Once       Question:  Patient needs a bedside commode to treat with the following condition  Answer:  Bacteremia   12/19/21 1314            Disposition and follow-up:   Ms.Taleia Linarez was discharged from [facility] in Stable condition.  At the hospital follow up visit please address:  1.  Dysuria, UTI- confirm that this has resolved/not recurred and recheck creatinine  2. Shortness of breath/pulmonary edema- patient has significant smoke exposure and wheezing heard on exam, may benefit from PFTs and long term inhalers. Was discharged with supplemental oxygen. Recheck whether this can be weaned down. Also arranged to have home health PT/OT  3. New onset atrial fibrillation- on eliquis at discharge, check bp/HR on dilt and metoprolol; assess volume status/leg swelling  4. Low B12- recheck levels in a few months  5.  Labs / imaging needed at time of follow-up: bmp, cbc, B12  5.  Pending labs/ test needing follow-up: none  Follow-up Appointments:  Contact information for follow-up providers     [facility] Follow up.   Why: company equipment was ordered through [employer] information: [place] [id number] [phone]         [facility] Follow up.   Specialty: Home Health Services Contact information: [address] [place] [address] [phone]         Nigel Mormon, MD Follow up on  01/18/2022.   Specialties: Cardiology, Radiology Why: 11 AM Contact information: [place] [address] [phone]              Contact information for after-discharge care     Destination     [facility] Preferred SNF .   Service: Skilled Nursing Contact information: [place] [phone]  Hospital Course by problem list: 1. Proteus bacteremia due to UTI; Sepsis Patient came in febrile with an elevated WBC to 19 and was short of breath. She had an elevated lactic acid as well as an AKI and met criteria for sepsis. Her CXR did not show any consolidations and the shortness of breath was thought to be related to the underlying infection. She also had dysuria and increased urinary frequency. Respiratory viral panel was negative. Patient was started on empiric broad spectrum antibiotics vanc and ceftriaxone. Bcx  grew proteus mirabilus and so vanc was discontinued. Ceftriaxone was deescalated to cefazolin when sensitivities came back. Patient had a total of 7 days of antibiotics. Patient was still requiring 3-4 L of oxygen upon discharge so she will be discharged  to home on oxygen with hopes to wean it. Was initially recommended to go to SNF, however, patient did not wish to go to SNF and wanted to be discharged to home with home health PT/OT.  2. Atrial fibrillation; Grade II diastolic Dysfunction; elevated pulmonary artery pressures/Pulmonary edema Patient developed atrial fibrillation with RVR in the setting of acute infection with no prior history of atrial fibrillation. She was given her home dose of metoprolol as well as continued on the antibiotics for acute infection as above and eliquis was started. Patient's heart rates were consistently tachycardic despite metoprolol so cardiology was consulted. They recommended starting diltiazem 120 daily. Her blood  pressures were in the low normal range but she was able to tolerate diltiazem 120 and metoprolol 50 daily. She will be continued on eliquis as an outpatient. If she converts back to NSR she may be able to discontinue this and will f/u with her PCP after this acute illness is resolved.  Aspirin was discontinued.  3. Acute on Chronic Kidney Disease stage 3b Patient's baseline creatinine about 1.3-1.5. This was elevated to 1.8-2.1 in the setting of UTI as above and she received some IV fluids for her lactic acidosis. Her creatinine continued to improve with treatment of the infection and it was near baseline upon discharge.   5. DMII Last A1c in November was 7.4, takes insulin and ozempic at home. She was initially started on a lower dose of insulin given patient complained of poor PO intake however her sugars spiked up to the 400s so she was restarted back on her home dose of 35 of long acting and 6 units of novolog with meals.  6. HTN Continued home amlodipine 5 daily  7. HLD Continued home lipitor daily  8. B12 Deficiency   Patient's B12 was low at 158 with a macrocytosis seen on CBC. Oral supplementation started.  9. Depression Continued home sertraline 100 mg daily  10. Hypocalcemia Patient initially hypocalcemic in the setting of poor PO intake, this was supplemented with calcium carbonate and resolved.   Discharge subjective: Feeling alright this morning. Has been able to get up and out of bed the last couple of days. Looking forward to going home.  Discharge Exam:   BP 108/60 (BP Location: Right Arm)    Pulse 97    Temp 98 F (36.7 C) (Oral)    Resp 16    Ht 5' 7"  (1.702 m)    Wt 133.3 kg    LMP  (LMP Unknown)    SpO2 96%    BMI 46.03 kg/m  Discharge exam:  Constitutional: obese elderly woman resting comfortably in bed, in no acute distress  Cardiovascular:irregularly irregular rhythm, no m/r/g, 1+ pitting edema of the  BLE, dependent edema of the upper thighs Pulmonary/Chest:  normal WOB on 2 L [place]. Abdominal: soft, non-tender, distended MSK: normal bulk and tone Neurological: alert & oriented x 3, mild resting tremor of the left hand Skin: warm and dry Psych: normal affect  Pertinent Labs, Studies, and Procedures:   DG Chest 2 View  Result Date: 12/12/2021 CLINICAL DATA:  Shortness of breath EXAM: CHEST - 2 VIEW COMPARISON:  Chest x-ray dated March 11, 2009 FINDINGS: Cardiac and mediastinal contours are within normal limits. Mild scattered linear opacities, likely due to atelectasis. No large pleural effusion or pneumothorax. IMPRESSION: No active cardiopulmonary disease. Electronically Signed   By: Yetta Glassman M.D.   On: 12/12/2021 12:14   US RENAL  Result Date: 12/13/2021 CLINICAL DATA:  Renal dysfunction EXAM: RENAL / URINARY TRACT ULTRASOUND COMPLETE COMPARISON:  None. FINDINGS: Right Kidney: Renal measurements: 9.7 x 5.8 x 5.3 cm = volume: 167.6 mL. There is no hydronephrosis. There is cortical thinning and increased echogenicity. Left Kidney: Renal measurements: 10.6 x 4.9 x 4.5 cm = volume: 122.1 mL. There is no hydronephrosis. There is 9 mm exophytic cyst in the lower pole. Bladder: Unremarkable Other: None. IMPRESSION: There is no hydronephrosis. There is cortical thinning and increased cortical echogenicity suggesting medical renal disease. Electronically Signed   By: Elmer Picker M.D.   On: 12/13/2021 10:16   ECHOCARDIOGRAM COMPLETE  Result Date: 12/13/2021    ECHOCARDIOGRAM REPORT   Patient Name:   Katelyn Mahoney Date of Exam: 12/13/2021 Medical Rec #:  [id number]        Height:       65.0 in Accession #:    [id number]       Weight:       288.6 lb Date of Birth:  08/24/1934        BSA:          2.312 m Patient Age:    57 years         BP:           136/107 mmHg Patient Gender: F                HR:           84 bpm. Exam Location:  Inpatient Procedure: 2D Echo, Cardiac Doppler and Color Doppler Indications:    Dyspnea R06.00  History:         Patient has no prior history of Echocardiogram examinations.                 Risk Factors:Hypertension, Diabetes and Dyslipidemia.  Sonographer:    Bernadene Person RDCS Referring Phys: [id number] [place]  1. Left ventricular ejection fraction, by estimation, is 50 to 55%. The left ventricle has low normal function. The left ventricle has no regional wall motion abnormalities. Left ventricular diastolic parameters are consistent with Grade II diastolic dysfunction (pseudonormalization).  2. Right ventricular systolic function is normal. The right ventricular size is normal. There is moderately elevated pulmonary artery systolic pressure. The estimated right ventricular systolic pressure is 20.1 mmHg.  3. A small pericardial effusion is present. The pericardial effusion is circumferential.  4. The mitral valve is degenerative. Moderate mitral valve regurgitation. No evidence of mitral stenosis.  5. The aortic valve is tricuspid. There is mild calcification of the aortic valve. There is mild thickening of the aortic valve. Aortic valve regurgitation is trivial. Aortic valve sclerosis/calcification is present, without any evidence of aortic stenosis.  6. The inferior vena cava  is dilated in size with <50% respiratory variability, suggesting right atrial pressure of 15 mmHg. Comparison(s): A prior study was performed on 2010. MR has increased from 2010 report. FINDINGS  Left Ventricle: Left ventricular ejection fraction, by estimation, is 50 to 55%. The left ventricle has low normal function. The left ventricle has no regional wall motion abnormalities. The left ventricular internal cavity size was normal in size. There is no left ventricular hypertrophy. Left ventricular diastolic parameters are consistent with Grade II diastolic dysfunction (pseudonormalization). Right Ventricle: The right ventricular size is normal. No increase in right ventricular wall thickness. Right ventricular systolic function is  normal. There is moderately elevated pulmonary artery systolic pressure. The tricuspid regurgitant velocity is 2.92 m/s, and with an assumed right atrial pressure of 15 mmHg, the estimated right ventricular systolic pressure is 35.3 mmHg. Left Atrium: Left atrial size was normal in size. Right Atrium: Right atrial size was normal in size. Pericardium: A small pericardial effusion is present. The pericardial effusion is circumferential. Mitral Valve: The mitral valve is degenerative in appearance. Moderate mitral valve regurgitation. No evidence of mitral valve stenosis. Tricuspid Valve: The tricuspid valve is normal in structure. Tricuspid valve regurgitation is mild. Aortic Valve: The aortic valve is tricuspid. There is mild calcification of the aortic valve. There is mild thickening of the aortic valve. There is mild aortic valve annular calcification. Aortic valve regurgitation is trivial. Aortic valve sclerosis/calcification is present, without any evidence of aortic stenosis. Pulmonic Valve: The pulmonic valve was not well visualized. Pulmonic valve regurgitation is not visualized. Aorta: The aortic root and ascending aorta are structurally normal, with no evidence of dilitation. Venous: The inferior vena cava is dilated in size with less than 50% respiratory variability, suggesting right atrial pressure of 15 mmHg. IAS/Shunts: The atrial septum is grossly normal.  LEFT VENTRICLE PLAX 2D LVIDd:         5.00 cm LVIDs:         3.90 cm LV PW:         1.10 cm LV IVS:        0.90 cm LVOT diam:     1.90 cm LV SV:         80 LV SV Index:   34 LVOT Area:     2.84 cm  LV Volumes (MOD) LV vol d, MOD A2C: 132.0 ml LV vol d, MOD A4C: 121.0 ml LV vol s, MOD A2C: 65.0 ml LV vol s, MOD A4C: 59.1 ml LV SV MOD A2C:     67.0 ml LV SV MOD A4C:     121.0 ml LV SV MOD BP:      66.1 ml RIGHT VENTRICLE RV S prime:     11.20 cm/s TAPSE (M-mode): 2.0 cm LEFT ATRIUM             Index        RIGHT ATRIUM           Index LA diam:         4.20 cm 1.82 cm/m   RA Area:     14.40 cm LA Vol (A2C):   62.0 ml 26.82 ml/m  RA Volume:   29.60 ml  12.80 ml/m LA Vol (A4C):   58.8 ml 25.44 ml/m LA Biplane Vol: 61.9 ml 26.78 ml/m  AORTIC VALVE LVOT Vmax:   139.00 cm/s LVOT Vmean:  94.025 cm/s LVOT VTI:    0.281 m  AORTA Ao Root diam: 3.30 cm Ao Asc diam:  3.40 cm  MR Peak grad:    93.1 mmHg    TRICUSPID VALVE MR Mean grad:    73.5 mmHg    TR Peak grad:   34.1 mmHg MR Vmax:         482.50 cm/s  TR Vmax:        292.00 cm/s MR Vmean:        419.0 cm/s MR PISA:         0.57 cm     SHUNTS MR PISA Eff ROA: 5 mm        Systemic VTI:  0.28 m MR PISA Radius:  0.30 cm      Systemic Diam: 1.90 cm Rudean Haskell MD Electronically signed by Rudean Haskell MD Signature Date/Time: 12/13/2021/6:33:53 PM    Final     CBC Latest Ref Rng & Units 12/27/2021 12/26/2021 12/25/2021  WBC 4.0 - 10.5 K/uL 11.2(H) 14.4(H) 15.6(H)  Hemoglobin 12.0 - 15.0 g/dL 11.4(L) 11.0(L) 11.6(L)  Hematocrit 36.0 - 46.0 % 34.4(L) 33.7(L) 35.8(L)  Platelets 150 - 400 K/uL 183 429(H) 424(H)    BMP Latest Ref Rng & Units 12/27/2021 12/26/2021 12/25/2021  Glucose 70 - 99 mg/dL 118(H) 134(H) 79  BUN 8 - 23 mg/dL 40(H) 39(H) 40(H)  Creatinine 0.44 - 1.00 mg/dL 1.63(H) 1.59(H) 1.54(H)  BUN/Creat Ratio 6 - 22 (calc) - - -  Sodium 135 - 145 mmol/L 138 136 140  Potassium 3.5 - 5.1 mmol/L 4.6 4.3 4.5  Chloride 98 - 111 mmol/L 102 104 105  CO2 22 - 32 mmol/L 25 26 25   Calcium 8.9 - 10.3 mg/dL 8.5(L) 8.5(L) 8.6(L)    Discharge Instructions: Discharge Instructions     Call MD for:  difficulty breathing, headache or visual disturbances   Complete by: As directed    Call MD for:  persistant dizziness or light-headedness   Complete by: As directed    Call MD for:  temperature >100.4   Complete by: As directed    Diet - low sodium heart healthy   Complete by: As directed    Increase activity slowly   Complete by: As directed        Signed: [name],  MD 12/27/2021, 10:33 AM   Pager: [phone]

## 2021-12-16 NOTE — TOC Initial Note (Signed)
Transition of Care [facility]) - Initial/Assessment Note    Patient Details  Name: Katelyn Mahoney MRN: [id number] Date of Birth: 15-Mar-1934  Transition of [facility]) CM/SW Contact:    [address], [place] Phone Number: 12/16/2021, 12:27 PM  Clinical Narrative:                 CSW received consult for possible SNF placement at time of discharge. CSW spoke with patient. Patient reported that patient's daughter is currently unable to care for patient at their home given patients current physical needs and fall risk. Patient expressed understanding of PT recommendation and is agreeable to SNF placement at time of discharge. Patient reports preference for [facility]. CSW discussed insurance authorization process and will provide Medicare SNF ratings list. Patient has received  COVID vaccines and 2 boosters. Patient expressed being hopeful for rehab and to feel better soon. No further questions reported at this time.   Skilled Nursing Rehab Facilities-   [email or web address]   Ratings out of 5 possible   Name Address  Phone # [place] [employer] [place]  [facility] [address] [place] 5 5 2 4   [facility]  [address], [place] [phone] 4 2 5 5   [facility] [place], [place] 4 1 1 1   [facility] [place], [place] 2 2 4 4   [facility] [address] [place] 2 1 1 1   [facility] 3 1 4 3   [facility] [address], [place] 5 2 2 3   [facility] [address] [place] 4 1 2 1   [facility] [place] 5 1 2 2   [facility] [phone], [place] [phone] 4 1 1 1   [facility] [address] [facility] [phone] 4 1 2 1   [facility] [phone] 4 1 1 1             Patient Goals and CMS Choice        Expected Discharge Plan and Services                                                Prior Living Arrangements/Services                       Activities of Daily Living Home Assistive Devices/Equipment: Gilford Rile (specify type), Built-in shower seat, Cane (specify quad or straight) ADL Screening (condition at time of admission) Patient's cognitive ability adequate to safely complete daily activities?: Yes Is the patient deaf or have difficulty hearing?: No Does the patient have difficulty seeing, even when wearing glasses/contacts?: No Does the patient have difficulty concentrating, remembering, or making decisions?: No Patient able to express need for assistance with ADLs?: No Does the patient have difficulty dressing or bathing?: Yes Independently performs ADLs?: Yes (appropriate for developmental age) Does the patient have difficulty walking or climbing stairs?: Yes Weakness of Legs: Both Weakness of Arms/Hands: None  Permission Sought/Granted                  Emotional Assessment              Admission diagnosis:  NSTEMI (non-ST  elevated myocardial infarction) ([place]) [I21.4] SIRS (systemic inflammatory response syndrome) ([place]) [R65.10] Acute respiratory failure with hypoxia ([place]) [J96.01] AKI (acute kidney injury) ([place]) [N17.9] Sepsis ([place]) [A41.9] Acute congestive heart failure, unspecified heart failure type ([place]) [I50.9] Patient Active Problem List   Diagnosis Date Noted   Urinary tract infection 12/13/2021   Acute kidney injury superimposed on chronic kidney disease ([place]) 12/13/2021   At risk for sepsis 12/13/2021   Primary osteoarthritis of both knees 03/09/2021   Urinary frequency 03/09/2021   Depression, major, single episode, in partial remission ([place]) 03/09/2021   Edema of lower extremity 08/20/2017   Hyperlipidemia  08/20/2017   CKD stage G3b/A1, GFR 30-44 and albumin creatinine ratio <30 mg/g (HCC) 02/27/2017   Allergic urticaria 08/02/2016   Fungal dermatitis 11/08/2015   Right leg pain 10/14/2015   Physical exam 11/09/2014   Dermatophytosis of nail 09/24/2013   Arthritis    Claw toe (acquired) 04/17/2013   Hammer toe, acquired 04/17/2013   Spherocytosis, hereditary ([place]) 08/30/2011   Diabetes mellitus ([place]) 10/17/2010   MORBID OBESITY 03/19/2008   Primary hypertension 03/19/2008   ROTATOR CUFF SPRAIN AND STRAIN 03/19/2008   Hyperlipidemia associated with type 2 diabetes mellitus ([place]) 09/05/2007   Depression 09/05/2007   ROSACEA 05/19/2007   PCP:  Ann Held, DO Pharmacy:   [facility] [place], [place] - [address] [place] [address] Phone: [phone] Fax: [phone]     Social Determinants of Health (SDOH) Interventions    Readmission Risk Interventions No flowsheet data found.

## 2021-12-16 NOTE — Progress Notes (Addendum)
HD#4 Subjective:  Overnight Events: NAEO   Patient complaining of a stopped up nose, she says she has had some blood streaks in her mucous which makes it harder to breath. She feels like if she could just get them out she would be ok. She has less burning with urination.  Objective:  Vital signs in last 24 hours: Vitals:   12/16/21 0043 12/16/21 0500 12/16/21 0517 12/16/21 0733  BP: 119/70  136/76 120/64  Pulse: 93  75 62  Resp: 20  19 16   Temp: 98.4 F (36.9 C)  (!) 97.5 F (36.4 C) (!) 97.5 F (36.4 C)  TempSrc: Oral  Oral Oral  SpO2: 98%  94% 96%  Weight:  134.4 kg    Height:       Supplemental O2: Nasal Cannula SpO2: 96 % O2 Flow Rate (L/min): 4 L/min   Physical Exam:  Constitutional: obese elderly woman resting comfortably in bed, in no acute distress  Cardiovascular:irregularly irregular rhythm, no m/r/g, 1+ pitting edema of the BLE, dependent edema of the upper thighs Pulmonary/Chest: slightly increased WOB on 4 L [place]. Expiratory wheezing, bibasilar crackles Abdominal: soft, non-tender, distended MSK: normal bulk and tone Neurological: alert & oriented x 3, mild resting tremor of the left hand Skin: warm and dry Psych: normal affect  Filed Weights   12/14/21 0433 12/15/21 0517 12/16/21 0500  Weight: (!) 137.6 kg 135 kg 134.4 kg     Intake/Output Summary (Last 24 hours) at 12/16/2021 0752 Last data filed at 12/16/2021 0000 Gross per 24 hour  Intake 320 ml  Output 550 ml  Net -230 ml   Net IO Since Admission: 1,746.92 mL [12/16/21 0752]  Pertinent Labs: CBC Latest Ref Rng & Units 12/16/2021 12/15/2021 12/14/2021  WBC 4.0 - 10.5 K/uL 16.6(H) 20.5(H) 28.7(H)  Hemoglobin 12.0 - 15.0 g/dL 11.8(L) 11.6(L) 12.7  Hematocrit 36.0 - 46.0 % 35.1(L) 34.7(L) 38.7  Platelets 150 - 400 K/uL 326 283 284    CMP Latest Ref Rng & Units 12/16/2021 12/15/2021 12/14/2021  Glucose 70 - 99 mg/dL 146(H) 196(H) 172(H)  BUN 8 - 23 mg/dL 43(H) 54(H) 53(H)  Creatinine  0.44 - 1.00 mg/dL 1.48(H) 1.71(H) 1.84(H)  Sodium 135 - 145 mmol/L 137 136 136  Potassium 3.5 - 5.1 mmol/L 4.2 4.8 4.9  Chloride 98 - 111 mmol/L 107 108 108  CO2 22 - 32 mmol/L 21(L) 19(L) 17(L)  Calcium 8.9 - 10.3 mg/dL 8.6(L) 8.5(L) 8.6(L)  Total Protein 6.5 - 8.1 g/dL - - 6.6  Total Bilirubin 0.3 - 1.2 mg/dL - - 0.6  Alkaline Phos 38 - 126 U/L - - 85  AST 15 - 41 U/L - - 19  ALT 0 - 44 U/L - - 21    Imaging: DG CHEST PORT 1 VIEW  Result Date: 12/15/2021 CLINICAL DATA:  Shortness of breath, fever. EXAM: PORTABLE CHEST 1 VIEW COMPARISON:  December 12, 2021. FINDINGS: Stable cardiomediastinal silhouette. Mildly increased bilateral lung opacities are noted concerning for edema or possibly multifocal inflammation. Bony thorax is unremarkable. IMPRESSION: Mildly increased bilateral lung opacities are noted concerning for edema or possibly multifocal inflammation. Electronically Signed   By: Marijo Conception M.D.   On: 12/15/2021 10:54    Assessment/Plan:   Principal Problem:   Urinary tract infection Active Problems:   Diabetes mellitus (HCC)   CKD stage G3b/A1, GFR 30-44 and albumin creatinine ratio <30 mg/g (HCC)   Acute kidney injury superimposed on chronic kidney disease ([place])   At  risk for sepsis   Patient Summary: Katelyn Mahoney is a 87 y.o. with pertinent PMH of DMII, HTN, obesity, CKD, and chronic lower extremity edema is presenting with a 1 week of dyspnea with associated cough admitted for Gram negative rod bacteremia.   #Proteus bacteremia due to UTI #Sepsis ruled in Patient's blood cultures grew proteus species, sensitive to cefazolin. WBC has improved to 16.6. Dysuria has also improved but patient is continuing to have respiratory symptoms likely related to acute illness. Crackles heard on exam, will continue to diurese. -Continue cefazolin 2g Q8, planning for 7-day course of antibiotics (end 12/20), will switch to oral as she makes more clinical improvement.  - daily  CBC   #Atrial fibrillation #Grade II diastolic Dysfunction; elevated pulmonary artery pressures Patient short of breath with LEE and a history of lasix on home medications though no clear documentation for why she takes this medication. Echo showed EF of 50-55% with low normal function and elevated PAP of 49.1. HR increased to the 110-140s 12/16, EKG showing atrial fibrillation, started eliquis. HR has improved to the 60s overnight. Has not had a large urine output, will dose lasix again. - CHADsVASC 6, HASBLED 2 - eliquis 2.5 BID - incentive spirometry - albuterol as needed for wheezing - wean oxygen as able - telemetry - lasix 40 again today - continue metoprolol 100 daily   Acute on Chronic Kidney Disease stage 3b; improving Renal function likely worsened by acute infection, dehydration. Stable to mildly improving with IV fluids and antibiotics. Renal ultrasound without hydronephrosis, shows medical renal disease. Will continue to monitor closely in the setting of diuresis as above. Creatinine closer to baseline today at 1.48 - Avoiding nephrotoxic agents.  - daily bmp   #DMII Last A1c in November was 7.4. Takes insulin and ozempic at home. Back on home dose of long acting insulin and sugars have improved - semglee 35 units and novolog 6 TID with meals - SSI - holding gabapentin   #HTN - Continue home amlodipine 5   #HLD - continue home lipitor 20 daily   #Depression - Continue sertraline 100 mg daily   #B12 Deficiency  Low at 158. Will start oral supplementation   #Hypocalcemia; resolved   Diet: Heart Healthy VTE: Eliquis Code: DNR/DNI  [name], MD Internal Medicine Resident PGY-1 Pager [phone] Please contact the on call pager after 5 pm and on weekends at [phone].

## 2021-12-16 NOTE — Progress Notes (Signed)
Paged Alfonse Spruce MD about EKG results. New order for Lasix 60mg - administered.   Call bell within reach and will continue toclose monitor patient.

## 2021-12-16 NOTE — Progress Notes (Signed)
1953 Patient HR 110's-130's. Non sustaining. Denies any chestpain/ SOB at this time. VSS BP 149/72 T97.7 100%/4L RR19. Alert and verbal. Paged IMTS, MD. Will continue to close monitor patient.  2100 Patient has c/o SOB,HR's still on 110's-130's. HOB elevated, given PRN Xopenex neb tx  with minimal results & increased 02 to 4.5L. current 02sats 98%. Bonano,MD & Nguyen,DO assessed the patient. EKG,BNP, Bladder scan, Chest Xray ordered  Call bell within reach and Will continue to close monitor patient

## 2021-12-17 DIAGNOSIS — N189 Chronic kidney disease, unspecified: Secondary | ICD-10-CM

## 2021-12-17 DIAGNOSIS — I509 Heart failure, unspecified: Secondary | ICD-10-CM

## 2021-12-17 DIAGNOSIS — N179 Acute kidney failure, unspecified: Secondary | ICD-10-CM

## 2021-12-17 LAB — BASIC METABOLIC PANEL
Anion gap: 8 (ref 5–15)
BUN: 38 mg/dL — ABNORMAL HIGH (ref 8–23)
CO2: 26 mmol/L (ref 22–32)
Calcium: 8.8 mg/dL — ABNORMAL LOW (ref 8.9–10.3)
Chloride: 104 mmol/L (ref 98–111)
Creatinine, Ser: 1.38 mg/dL — ABNORMAL HIGH (ref 0.44–1.00)
GFR, Estimated: 38 mL/min — ABNORMAL LOW (ref >60)
Glucose, Bld: 127 mg/dL — ABNORMAL HIGH (ref 70–99)
Potassium: 4.2 mmol/L (ref 3.5–5.1)
Sodium: 138 mmol/L (ref 135–145)

## 2021-12-17 LAB — CBC
HCT: 35.6 % — ABNORMAL LOW (ref 36.0–46.0)
Hemoglobin: 11.5 g/dL — ABNORMAL LOW (ref 12.0–15.0)
MCH: 32.9 pg (ref 26.0–34.0)
MCHC: 32.3 g/dL (ref 30.0–36.0)
MCV: 101.7 fL — ABNORMAL HIGH (ref 80.0–100.0)
Platelets: 388 10*3/uL (ref 150–400)
RBC: 3.5 MIL/uL — ABNORMAL LOW (ref 3.87–5.11)
RDW: 14.6 % (ref 11.5–15.5)
WBC: 14.4 10*3/uL — ABNORMAL HIGH (ref 4.0–10.5)
nRBC: 0.6 % — ABNORMAL HIGH (ref 0.0–0.2)

## 2021-12-17 LAB — CULTURE, BLOOD (ROUTINE X 2): Special Requests: ADEQUATE

## 2021-12-17 LAB — BRAIN NATRIURETIC PEPTIDE: B Natriuretic Peptide: 717.5 pg/mL — ABNORMAL HIGH (ref 0.0–100.0)

## 2021-12-17 LAB — GLUCOSE, CAPILLARY
Glucose-Capillary: 116 mg/dL — ABNORMAL HIGH (ref 70–99)
Glucose-Capillary: 116 mg/dL — ABNORMAL HIGH (ref 70–99)
Glucose-Capillary: 117 mg/dL — ABNORMAL HIGH (ref 70–99)
Glucose-Capillary: 130 mg/dL — ABNORMAL HIGH (ref 70–99)
Glucose-Capillary: 170 mg/dL — ABNORMAL HIGH (ref 70–99)
Glucose-Capillary: 198 mg/dL — ABNORMAL HIGH (ref 70–99)

## 2021-12-17 MED ORDER — FUROSEMIDE 10 MG/ML IJ SOLN
60.0000 mg | Freq: Once | INTRAMUSCULAR | Status: AC
  Administered 2021-12-17: 10:00:00 60 mg via INTRAVENOUS
  Filled 2021-12-17: qty 6

## 2021-12-17 NOTE — Progress Notes (Signed)
Mobility Specialist Progress Note    12/17/21 1722  Mobility  Activity Transferred:  Chair to bed  Level of Assistance Minimal assist, patient does 75% or more  Assistive Device Front wheel walker  Distance Ambulated (ft) 3 ft  Mobility Response Tolerated fair  Mobility performed by Mobility specialist  $Mobility charge [place] Specialist  M.S. Primary Phone: [phone] M.S. Secondary Phone: [phone]

## 2021-12-17 NOTE — Progress Notes (Signed)
RT note-called for treatment, patient stating that she is short of breath, no distress noted, vitals and BBS WNL

## 2021-12-17 NOTE — Progress Notes (Signed)
Patient continues to have HR of 110's-130's, fluctuating with frequent PVCs. Patient is alert and verbal, denies any chestpain at this moment. Demaio, MD made aware.  Call bell within reach and will continue to close monitor.

## 2021-12-17 NOTE — Progress Notes (Signed)
HD#5 Subjective:  Overnight Events: Patient had an episode of SOB overnight, got a CXR which showed increased edema. Got 60 IV lasix  Patient says she still feels short of breath and has a little bit of cough but thinks it is a little better than last night. Otherwise no new complaints  Objective:  Vital signs in last 24 hours: Vitals:   12/17/21 0505 12/17/21 0749 12/17/21 0803 12/17/21 0811  BP: 129/72  (!) 102/57 125/66  Pulse: 63  (!) 54 68  Resp:   18   Temp: 98.1 F (36.7 C)  97.9 F (36.6 C)   TempSrc:   Oral   SpO2:  97% 98%   Weight:      Height:       Supplemental O2: Nasal Cannula SpO2: 98 % O2 Flow Rate (L/min): 4 L/min   Physical Exam:  Constitutional: obese elderly woman resting comfortably in bed, in no acute distress  Cardiovascular:irregularly irregular rhythm, no m/r/g, 1+ pitting edema of the BLE, dependent edema of the upper thighs Pulmonary/Chest: slightly increased WOB on 4 L [place]. Expiratory wheezing, bibasilar crackles Abdominal: soft, non-tender, distended MSK: normal bulk and tone Neurological: alert & oriented x 3, mild resting tremor of the left hand Skin: warm and dry Psych: normal affect  Filed Weights   12/15/21 0517 12/16/21 0500 12/17/21 0500  Weight: 135 kg 134.4 kg 132.8 kg     Intake/Output Summary (Last 24 hours) at 12/17/2021 0923 Last data filed at 12/17/2021 0734 Gross per 24 hour  Intake 320 ml  Output 2201 ml  Net -1881 ml   Net IO Since Admission: -134.08 mL [12/17/21 0923]  Pertinent Labs: CBC Latest Ref Rng & Units 12/17/2021 12/16/2021 12/15/2021  WBC 4.0 - 10.5 K/uL 14.4(H) 16.6(H) 20.5(H)  Hemoglobin 12.0 - 15.0 g/dL 11.5(L) 11.8(L) 11.6(L)  Hematocrit 36.0 - 46.0 % 35.6(L) 35.1(L) 34.7(L)  Platelets 150 - 400 K/uL 388 326 283    CMP Latest Ref Rng & Units 12/17/2021 12/16/2021 12/15/2021  Glucose 70 - 99 mg/dL 127(H) 146(H) 196(H)  BUN 8 - 23 mg/dL 38(H) 43(H) 54(H)  Creatinine 0.44 - 1.00 mg/dL 1.38(H)  1.48(H) 1.71(H)  Sodium 135 - 145 mmol/L 138 137 136  Potassium 3.5 - 5.1 mmol/L 4.2 4.2 4.8  Chloride 98 - 111 mmol/L 104 107 108  CO2 22 - 32 mmol/L 26 21(L) 19(L)  Calcium 8.9 - 10.3 mg/dL 8.8(L) 8.6(L) 8.5(L)  Total Protein 6.5 - 8.1 g/dL - - -  Total Bilirubin 0.3 - 1.2 mg/dL - - -  Alkaline Phos 38 - 126 U/L - - -  AST 15 - 41 U/L - - -  ALT 0 - 44 U/L - - -    Imaging: DG CHEST PORT 1 VIEW  Result Date: 12/16/2021 CLINICAL DATA:  Dyspnea EXAM: PORTABLE CHEST 1 VIEW COMPARISON:  None. FINDINGS: Cardiac shadow is enlarged. Aortic calcifications are noted. Lungs are well aerated bilaterally with patchy airspace opacities slightly increased on the right when compared with the prior exam. These are slightly greater on the right than the left. No bony abnormality is noted. IMPRESSION: Increasing bilateral airspace opacities particularly on the right. Electronically Signed   By: Inez Catalina M.D.   On: 12/16/2021 23:18    Assessment/Plan:   Principal Problem:   Urinary tract infection Active Problems:   Diabetes mellitus (HCC)   CKD stage G3b/A1, GFR 30-44 and albumin creatinine ratio <30 mg/g (HCC)   Acute kidney injury superimposed on chronic  kidney disease ([place])   At risk for sepsis   Patient Summary: Katelyn Mahoney is a 87 y.o.  with pertinent PMH of DMII, HTN, obesity, CKD, and chronic lower extremity edema is presenting with a 1 week of dyspnea with associated cough admitted for Gram negative rod bacteremia.   #Proteus bacteremia due to UTI #Sepsis ruled in Patient's blood cultures grew proteus species, sensitive to cefazolin. WBC has improved to 14.4. Dysuria has also improved and creatinine is improving. Can likely deescalate to orals tomorrow. -Continue cefazolin 2g Q8, planning for 7-day course of antibiotics (end 12/20), will switch to oral as she makes more clinical improvement.  - daily CBC   #Atrial fibrillation #Grade II diastolic Dysfunction; elevated  pulmonary artery pressures Patient short of breath with LEE and a history of lasix on home medications though no clear documentation for why she takes this medication. Echo showed EF of 50-55% with low normal function and elevated PAP of 49.1. CXR showing increasing edema and patient more short of breath. Had not been responding adequately to oral lasix, switched this to IV lasix overnight and has put out a good amount of urine since, (600 since midnight). Crackles still present on exam will redose another 60 of IV lasix this morning and can consider another dose this afternoon depending on how she responds. - CHADsVASC 6, HASBLED 2 - eliquis 2.5 BID - incentive spirometry - albuterol as needed for wheezing - wean oxygen as able - telemetry - lasix 60  IV again today; consider afternoon dose. - continue metoprolol 100 daily   Acute on Chronic Kidney Disease stage 3b; improving Renal function likely worsened by acute infection, dehydration. Stable to mildly improving with IV fluids and antibiotics. Renal ultrasound without hydronephrosis, shows medical renal disease. Will continue to monitor closely in the setting of diuresis as above. Creatinine closer to baseline today at 1.48 - Avoiding nephrotoxic agents.  - daily bmp   #DMII Last A1c in November was 7.4. Takes insulin and ozempic at home. Back on home dose of long acting insulin and sugars have improved - semglee 35 units and novolog 6 TID with meals - SSI - holding gabapentin   #HTN - Continue home amlodipine 5   #HLD - continue home lipitor 20 daily   #Depression - Continue sertraline 100 mg daily   #B12 Deficiency  Low at 158. Will start oral supplementation   #Hypocalcemia; resolved   Diet: Heart Healthy VTE: Eliquis Code: DNR/DNI  Katelyn Presto, MD Internal Medicine Resident PGY-1 Pager [phone] Please contact the on call pager after 5 pm and on weekends at [phone].

## 2021-12-18 LAB — CBC
HCT: 35.8 % — ABNORMAL LOW (ref 36.0–46.0)
Hemoglobin: 12.3 g/dL (ref 12.0–15.0)
MCH: 34.6 pg — ABNORMAL HIGH (ref 26.0–34.0)
MCHC: 34.4 g/dL (ref 30.0–36.0)
MCV: 100.6 fL — ABNORMAL HIGH (ref 80.0–100.0)
Platelets: 448 10*3/uL — ABNORMAL HIGH (ref 150–400)
RBC: 3.56 MIL/uL — ABNORMAL LOW (ref 3.87–5.11)
RDW: 14.4 % (ref 11.5–15.5)
WBC: 15.2 10*3/uL — ABNORMAL HIGH (ref 4.0–10.5)
nRBC: 0.3 % — ABNORMAL HIGH (ref 0.0–0.2)

## 2021-12-18 LAB — BASIC METABOLIC PANEL
Anion gap: 10 (ref 5–15)
BUN: 42 mg/dL — ABNORMAL HIGH (ref 8–23)
CO2: 24 mmol/L (ref 22–32)
Calcium: 8.4 mg/dL — ABNORMAL LOW (ref 8.9–10.3)
Chloride: 103 mmol/L (ref 98–111)
Creatinine, Ser: 1.46 mg/dL — ABNORMAL HIGH (ref 0.44–1.00)
GFR, Estimated: 35 mL/min — ABNORMAL LOW (ref >60)
Glucose, Bld: 118 mg/dL — ABNORMAL HIGH (ref 70–99)
Potassium: 3.7 mmol/L (ref 3.5–5.1)
Sodium: 137 mmol/L (ref 135–145)

## 2021-12-18 LAB — GLUCOSE, CAPILLARY
Glucose-Capillary: 121 mg/dL — ABNORMAL HIGH (ref 70–99)
Glucose-Capillary: 124 mg/dL — ABNORMAL HIGH (ref 70–99)
Glucose-Capillary: 139 mg/dL — ABNORMAL HIGH (ref 70–99)
Glucose-Capillary: 146 mg/dL — ABNORMAL HIGH (ref 70–99)
Glucose-Capillary: 151 mg/dL — ABNORMAL HIGH (ref 70–99)
Glucose-Capillary: 160 mg/dL — ABNORMAL HIGH (ref 70–99)

## 2021-12-18 NOTE — Care Management Important Message (Signed)
Important Message  Patient Details  Name: Katelyn Mahoney MRN: [id number] Date of Birth: 03-Dec-1934   Medicare Important Message Given:  Yes     Hannah Beat 12/18/2021, 12:21 PM

## 2021-12-18 NOTE — Progress Notes (Signed)
Physical Therapy Treatment Patient Details Name: Katelyn Mahoney MRN: [id number] DOB: [birth year] Today's Date: 12/18/2021   History of Present Illness Pt is 87 y/o F presenting with fever and acute hypoxic respiratory failure, found to have UTI. PMH includes DM II, HTN, obesity, CKD, HLD, chronic LE edema.    PT Comments    Pt admitted with above diagnosis. Pt was able to incr distance with ambulation with rest breaks. Also pt ambulated with and without O2 and pt does desaturate on RA with activity. Needed 3LO2 to maintain sats >88%.   Pt progressing slowly due to O2 needs and poor endurance.  Needs SNF for rehab as she doesn't have support full time at home. Pt currently with functional limitations due to balance and endurance deficits. Pt will benefit from skilled PT to increase their independence and safety with mobility to allow discharge to the venue listed below.      Recommendations for follow up therapy are one component of a multi-disciplinary discharge planning process, led by the attending physician.  Recommendations may be updated based on patient status, additional functional criteria and insurance authorization.  Follow Up Recommendations  Skilled nursing-short term rehab (<3 hours/day)     Assistance Recommended at Discharge Intermittent Supervision/Assistance  Equipment Recommendations  None recommended by PT    Recommendations for Other Services       Precautions / Restrictions Precautions Precautions: Fall;Other (comment) Precaution Comments: Monitor vitals- O2 Restrictions Weight Bearing Restrictions: No     Mobility  Bed Mobility Overal bed mobility: Needs Assistance Bed Mobility: Supine to Sit     Supine to sit: Min assist;HOB elevated     General bed mobility comments: Pt in chair on arrival    Transfers Overall transfer level: Needs assistance Equipment used: Rolling walker (2 wheels) Transfers: Sit to/from Stand Sit to Stand: Min guard      Step pivot transfers: Min assist     General transfer comment: Patient relying on momentum and rocking back and forth to come to standing from recliner, but excellent hand placement throughout for 3 sit<>stands.    Ambulation/Gait Ambulation/Gait assistance: Min guard Gait Distance (Feet): 22 Feet (6 feet x 2 and then 10 feet forward and back each time) Assistive device: Rolling walker (2 wheels) Gait Pattern/deviations: Step-through pattern;Decreased stride length;Wide base of support;Antalgic   Gait velocity interpretation: <1.31 ft/sec, indicative of household ambulator   General Gait Details: Pt on 3LO2 on arrival with sats 94% at rest.  91 % at rest on RA.  Pt desat to 86% on rA with activity therefore replaced O2 for the last walk and sats on 3L with activity 90% and >.  Pt was able to ambulate a few steps foward and back 3 different times with pt walking furthter the last time when she had the O2 in place at 3L.  Pt desaturates on RA with activity.   Stairs             Wheelchair Mobility    Modified Rankin (Stroke Patients Only)       Balance Overall balance assessment: Needs assistance Sitting-balance support: Feet supported Sitting balance-Leahy Scale: Fair     Standing balance support: Bilateral upper extremity supported Standing balance-Leahy Scale: Poor Standing balance comment: Pt relies heavily on RW for balance and support                            Cognition Arousal/Alertness: Awake/alert Behavior During Therapy: [facility] for  tasks assessed/performed Overall Cognitive Status: Within Functional Limits for tasks assessed                                          Exercises General Exercises - Lower Extremity Ankle Circles/Pumps: AROM;Both;10 reps;Supine Long Arc Quad: AROM;Both;10 reps;Seated Hip Flexion/Marching: AROM;Both;10 reps;Seated    General Comments        Pertinent Vitals/Pain Pain Assessment: No/denies  pain Faces Pain Scale: No hurt Pain Location: LLE Pain Descriptors / Indicators: Aching    Home Living                          Prior Function            PT Goals (current goals can now be found in the care plan section) Acute Rehab PT Goals Patient Stated Goal: to go homeafter rehab PT Goal Formulation: With patient Time For Goal Achievement: 12/29/21 Potential to Achieve Goals: Good Progress towards PT goals: Progressing toward goals    Frequency    Min 2X/week      PT Plan Frequency needs to be updated    Co-evaluation              AM-PAC PT "6 Clicks" Mobility   Outcome Measure  Help needed turning from your back to your side while in a flat bed without using bedrails?: A Little Help needed moving from lying on your back to sitting on the side of a flat bed without using bedrails?: A Little Help needed moving to and from a bed to a chair (including a wheelchair)?: A Little Help needed standing up from a chair using your arms (e.g., wheelchair or bedside chair)?: A Little Help needed to walk in hospital room?: Total Help needed climbing 3-5 steps with a railing? : Total 6 Click Score: 14    End of Session Equipment Utilized During Treatment: Gait belt;Oxygen Activity Tolerance: Patient limited by fatigue Patient left: in chair;with call bell/phone within reach;with chair alarm set;with family/visitor present (pt asking if hospital has a notary as grandson in the room and pt has papers.  Callled front desk to ask if they knew who could assist pt with this. Also mentioned to nurse.) Nurse Communication: Mobility status PT Visit Diagnosis: Muscle weakness (generalized) (M62.81)     Time: 7416-3845 PT Time Calculation (min) (ACUTE ONLY): 21 min  Charges:  $Gait Training: 8-22 mins                     [name] M,PT Acute Rehab Services [phone] (pager)    [name] 12/18/2021, 3:51 PM

## 2021-12-18 NOTE — Progress Notes (Signed)
Occupational Therapy Treatment Patient Details Name: Katelyn Mahoney MRN: [id number] DOB: 1934-03-17 Today's Date: 12/18/2021   History of present illness Pt is 87 y/o F presenting with fever and acute hypoxic respiratory failure, found to have UTI. PMH includes DM II, HTN, obesity, CKD, HLD, chronic LE edema.   OT comments  Patient continues to make steady progress towards goals in skilled OT session. Patient's session encompassed multiple sit<>stands from the recliner in order to increase activity tolerance. Patient on 4L O2 upon entry with sats at 98 sitting in recliner. Patient relies heavily on momentum for sit<>stands, however with good demonstration of hand placement to power up. Patient is only able to remain standing for 30-40 seconds before needing seated rest break, but no dip in oxygen noted throughout session. Educated patient on seated exercises to complete in order to promote increased activity tolerance. Therapy will continue to follow in house; discharge remains appropriate.    Recommendations for follow up therapy are one component of a multi-disciplinary discharge planning process, led by the attending physician.  Recommendations may be updated based on patient status, additional functional criteria and insurance authorization.    Follow Up Recommendations  Home health OT    Assistance Recommended at Discharge Intermittent Supervision/Assistance  Equipment Recommendations  BSC/3in1 (bariatric)    Recommendations for Other Services      Precautions / Restrictions Precautions Precautions: Fall;Other (comment) Precaution Comments: Monitor vitals- O2 Restrictions Weight Bearing Restrictions: No       Mobility Bed Mobility               General bed mobility comments: Pt in chair on arrival    Transfers Overall transfer level: Needs assistance Equipment used: Rolling walker (2 wheels) Transfers: Sit to/from Stand Sit to Stand: Min guard            General transfer comment: Patient relying on momentum and rocking back and forth to come to standing from recliner, but excellent hand placement throughout for 3 sit<>stands. Able to complete marching in place 2/3 reps with O2 sats stable. Patient with increased fatigue on 3rd attempt, but motivated to complete     Balance Overall balance assessment: Needs assistance Sitting-balance support: Feet supported Sitting balance-Leahy Scale: Fair     Standing balance support: Bilateral upper extremity supported Standing balance-Leahy Scale: Poor Standing balance comment: Pt relies heavily on RW for balance and support                           ADL either performed or assessed with clinical judgement   ADL Overall ADL's : Needs assistance/impaired                                     Functional mobility during ADLs: Min guard General ADL Comments: Session focus on repeated sit<>stands in order to increase overall activity tolerance    Extremity/Trunk Assessment              Vision       Perception     Praxis      Cognition Arousal/Alertness: Awake/alert Behavior During Therapy: WFL for tasks assessed/performed Overall Cognitive Status: Within Functional Limits for tasks assessed  Exercises     Shoulder Instructions       General Comments      Pertinent Vitals/ Pain       Pain Assessment: Faces Faces Pain Scale: No hurt  Home Living                                          Prior Functioning/Environment              Frequency  Min 2X/week        Progress Toward Goals  OT Goals(current goals can now be found in the care plan section)  Progress towards OT goals: Progressing toward goals  Acute Rehab OT Goals Patient Stated Goal: get better and go home OT Goal Formulation: With patient Time For Goal Achievement: 12/29/21 Potential to Achieve  Goals: Good  Plan Discharge plan remains appropriate    Co-evaluation                 AM-PAC OT "6 Clicks" Daily Activity     Outcome Measure   Help from another person eating meals?: None Help from another person taking care of personal grooming?: None Help from another person toileting, which includes using toliet, bedpan, or urinal?: A Lot Help from another person bathing (including washing, rinsing, drying)?: A Lot Help from another person to put on and taking off regular upper body clothing?: A Little Help from another person to put on and taking off regular lower body clothing?: A Lot 6 Click Score: 17    End of Session Equipment Utilized During Treatment: Gait belt;Rolling walker (2 wheels);Oxygen  OT Visit Diagnosis: Unsteadiness on feet (R26.81);Muscle weakness (generalized) (M62.81)   Activity Tolerance Patient tolerated treatment well;Patient limited by fatigue   Patient Left in chair;with call bell/phone within reach;with chair alarm set;with family/visitor present   Nurse Communication Mobility status        Time: 1031-5945 OT Time Calculation (min): 30 min  Charges: OT General Charges $OT Visit: 1 Visit OT Treatments $Self Care/Home Management : 23-37 mins  [place]. [place], [place] Acute Rehabilitation Services [place] 12/18/2021, 3:04 PM

## 2021-12-18 NOTE — Progress Notes (Signed)
HD#6 Subjective:  Overnight Events: NAEO   Patient still feeling weak and somewhat short of breath. She has been trying to use her spirometer. Says that she sat in the chair for most of yesterday.  Objective:  Vital signs in last 24 hours: Vitals:   12/17/21 2100 12/18/21 0008 12/18/21 0321 12/18/21 0500  BP: 122/82 138/60 (!) 142/83   Pulse: 63 (!) 52 (!) 49   Resp: 19     Temp: 98.2 F (36.8 C) 98.1 F (36.7 C) 98.3 F (36.8 C)   TempSrc: Oral     SpO2: 98% 98% 98%   Weight:    128.9 kg  Height:       Supplemental O2: Nasal Cannula SpO2: 98 % O2 Flow Rate (L/min): 4 L/min   Constitutional: obese elderly woman resting comfortably in bed, in no acute distress  Cardiovascular:irregularly irregular rhythm, no m/r/g, 1+ pitting edema of the BLE, dependent edema of the upper thighs Pulmonary/Chest: normal WOB on 4 L . Expiratory wheezing, bibasilar crackles Abdominal: soft, non-tender, distended MSK: normal bulk and tone Neurological: alert & oriented x 3, mild resting tremor of the left hand Skin: warm and dry Psych: normal affect  Filed Weights   12/16/21 0500 12/17/21 0500 12/18/21 0500  Weight: 134.4 kg 132.8 kg 128.9 kg     Intake/Output Summary (Last 24 hours) at 12/18/2021 0647 Last data filed at 12/18/2021 0311 Gross per 24 hour  Intake 200 ml  Output 1100 ml  Net -900 ml   Net IO Since Admission: -434.08 mL [12/18/21 0647]  Pertinent Labs: CBC Latest Ref Rng & Units 12/18/2021 12/17/2021 12/16/2021  WBC 4.0 - 10.5 K/uL 15.2(H) 14.4(H) 16.6(H)  Hemoglobin 12.0 - 15.0 g/dL 12.3 11.5(L) 11.8(L)  Hematocrit 36.0 - 46.0 % 35.8(L) 35.6(L) 35.1(L)  Platelets 150 - 400 K/uL 448(H) 388 326    CMP Latest Ref Rng & Units 12/18/2021 12/17/2021 12/16/2021  Glucose 70 - 99 mg/dL 118(H) 127(H) 146(H)  BUN 8 - 23 mg/dL 42(H) 38(H) 43(H)  Creatinine 0.44 - 1.00 mg/dL 1.46(H) 1.38(H) 1.48(H)  Sodium 135 - 145 mmol/L 137 138 137  Potassium 3.5 - 5.1 mmol/L  3.7 4.2 4.2  Chloride 98 - 111 mmol/L 103 104 107  CO2 22 - 32 mmol/L 24 26 21(L)  Calcium 8.9 - 10.3 mg/dL 8.4(L) 8.8(L) 8.6(L)  Total Protein 6.5 - 8.1 g/dL - - -  Total Bilirubin 0.3 - 1.2 mg/dL - - -  Alkaline Phos 38 - 126 U/L - - -  AST 15 - 41 U/L - - -  ALT 0 - 44 U/L - - -    Imaging: No results found.  Assessment/Plan:   Principal Problem:   Urinary tract infection Active Problems:   Diabetes mellitus ([place])   CKD stage G3b/A1, GFR 30-44 and albumin creatinine ratio <30 mg/g (HCC)   Acute kidney injury superimposed on chronic kidney disease (HCC)   At risk for sepsis   Acute congestive heart failure [facility])   Patient Summary: Katelyn Mahoney is a 87 y.o. with pertinent PMH of DMII, HTN, obesity, CKD, and chronic lower extremity edema is presenting with a 1 week of dyspnea with associated cough admitted for Gram negative rod bacteremia.   #Proteus bacteremia due to UTI #Sepsis ruled in Patient's blood cultures grew proteus species, sensitive to cefazolin. WBC has been improving/stable around 14-15. Dysuria has also improved. Given that she is still not making drastic clinical improvement will continue with IV antibiotics. -Continue cefazolin 2g  Q8, planning for 7-day course of antibiotics (end 12/20) - daily CBC   #Atrial fibrillation #Grade II diastolic Dysfunction; elevated pulmonary artery pressures Patient short of breath with LEE and a history of lasix on home medications though no clear documentation for why she takes this medication. Echo showed EF of 50-55% with low normal function and elevated PAP of 49.1. CXR showing increasing edema and patient more short of breath. Switched to IV lasix due to patient's increasing edema on CXR and shortness of breath. She has put out >1 of urine in the last two days but her creatinine has bumped back up to 1.48. Will hold off on further doses of lasix as this may be non-cardiogenic edema. - CHADsVASC 6, HASBLED 2 - eliquis 2.5  BID - incentive spirometry - albuterol as needed for wheezing - wean oxygen as able - telemetry - hold off on lasix - continue metoprolol 100 daily   Acute on Chronic Kidney Disease stage 3b Creatinine bumped to 1.46 from 1.38 after diuresis suggesting that patient may not be volume overloaded. Will hold off on further diuresis today. - Avoiding nephrotoxic agents.  - daily bmp   #DMII Last A1c in November was 7.4. Takes insulin and ozempic at home. Back on home dose of long acting insulin and sugars have improved - semglee 35 units and novolog 6 TID with meals - SSI - holding gabapentin   #HTN - Continue home amlodipine 5   #HLD - continue home lipitor 20 daily   #Depression - Continue sertraline 100 mg daily   #B12 Deficiency  Low at 158. Will start oral supplementation   #Hypocalcemia; resolved   Diet: Heart Healthy VTE: Eliquis Code: DNR/DNI  [name], MD Internal Medicine Resident PGY-1 Pager [phone] Please contact the on call pager after 5 pm and on weekends at [phone].

## 2021-12-18 NOTE — Progress Notes (Signed)
Occupational Therapy Note  Spoke with PT who stated that pt will no longer have the level of assistance at home that was originally anticipated. Updating recommendation to SNF for further rehab to improve safety/independence with ADLs prior to return home.     12/18/21 1555  OT Assessment/Plan  Follow Up Recommendations Skilled nursing-short term rehab (<3 hours/day)  Assistance recommended at discharge Frequent or constant Supervision/Assistance   Kimberlie Csaszar C, OT/L  Acute Rehab [phone]

## 2021-12-18 NOTE — Progress Notes (Signed)
Mobility Specialist Progress Note:   12/18/21 1400  Mobility  Activity Ambulated in room  Level of Assistance Contact guard assist, steadying assist  Assistive Device Front wheel walker  Distance Ambulated (ft) 10 ft  Mobility Ambulated with assistance in room  Mobility Response Tolerated well  Mobility performed by Mobility specialist  Bed Position Chair  $Mobility charge 1 Mobility   Session performed on supplemental O2. Pt able to rock and gain momentum to stand with standbyA. C/o BLE trembling upon ambulation. Otherwise asx, back in chair with chair alarm on.   Nelta Numbers Mobility Specialist  Phone [phone]

## 2021-12-18 NOTE — TOC Progression Note (Signed)
Transition of Care [facility]) - Progression Note    Patient Details  Name: [name] MRN: [id number] Date of Birth: 02-Dec-1934  [facility]) CM/SW Contact  Emeterio Reeve, [place] Phone Number: 12/18/2021, 4:24 PM  Clinical Narrative:     CSW gave pt bed offers. Pt stated she wants to go to [facility]. CSW sent message to admissions coordinator to review. CSW also explained to pts daughter by phone.   Expected Discharge Plan: [name] Barriers to Discharge: Continued Medical Work up, Ship broker  Expected Discharge Plan and Services Expected Discharge Plan: [place] In-house Referral: Clinical Social Work     Living arrangements for the past 2 months: Single Family Home                                       Social Determinants of Health (SDOH) Interventions    Readmission Risk Interventions No flowsheet data found.  Emeterio Reeve, LCSW Clinical Social Worker

## 2021-12-19 LAB — BASIC METABOLIC PANEL
Anion gap: 9 (ref 5–15)
BUN: 41 mg/dL — ABNORMAL HIGH (ref 8–23)
CO2: 27 mmol/L (ref 22–32)
Calcium: 8.6 mg/dL — ABNORMAL LOW (ref 8.9–10.3)
Chloride: 103 mmol/L (ref 98–111)
Creatinine, Ser: 1.59 mg/dL — ABNORMAL HIGH (ref 0.44–1.00)
GFR, Estimated: 32 mL/min — ABNORMAL LOW (ref >60)
Glucose, Bld: 108 mg/dL — ABNORMAL HIGH (ref 70–99)
Potassium: 3.5 mmol/L (ref 3.5–5.1)
Sodium: 139 mmol/L (ref 135–145)

## 2021-12-19 LAB — CBC
HCT: 37.3 % (ref 36.0–46.0)
Hemoglobin: 12.4 g/dL (ref 12.0–15.0)
MCH: 33.6 pg (ref 26.0–34.0)
MCHC: 33.2 g/dL (ref 30.0–36.0)
MCV: 101.1 fL — ABNORMAL HIGH (ref 80.0–100.0)
Platelets: 527 10*3/uL — ABNORMAL HIGH (ref 150–400)
RBC: 3.69 MIL/uL — ABNORMAL LOW (ref 3.87–5.11)
RDW: 14.6 % (ref 11.5–15.5)
WBC: 15.6 10*3/uL — ABNORMAL HIGH (ref 4.0–10.5)
nRBC: 0.3 % — ABNORMAL HIGH (ref 0.0–0.2)

## 2021-12-19 LAB — GLUCOSE, CAPILLARY
Glucose-Capillary: 105 mg/dL — ABNORMAL HIGH (ref 70–99)
Glucose-Capillary: 118 mg/dL — ABNORMAL HIGH (ref 70–99)
Glucose-Capillary: 118 mg/dL — ABNORMAL HIGH (ref 70–99)
Glucose-Capillary: 134 mg/dL — ABNORMAL HIGH (ref 70–99)
Glucose-Capillary: 151 mg/dL — ABNORMAL HIGH (ref 70–99)
Glucose-Capillary: 196 mg/dL — ABNORMAL HIGH (ref 70–99)

## 2021-12-19 NOTE — Discharge Instructions (Addendum)
Katelyn Mahoney  You were recently admitted to [facility] for a urinary tract infection. We treated you with antibiotics and IV fluids to help you get rid of the infection. You had an irregular heart rhythm during your stay so we put you on a blood thinner. You should continue this but talk to your doctor as an outpatient to see how long you need to continue it. Continue using oxygen to help with your shortness of breath.  Continue taking your home medications with the following changes  Start taking levalbuterol inhaler every 6 hours as needed for shortness of breath Change how much insulin you take: take 6 units of novolog with meals. Continue taking 34 units of toujeo daily. Start taking vitamin B12 1000 mcg every day.  4.  Start taking eliquis 2.5 mg twice a day. 5.  Change how you take metoprolol. You will now take 50 mg once a day. 6.  Start taking protonix 40 mg once a day 7.  Continue taking your 40 mg of lasix daily 8.  Stop taking amlodipine and aspirin  You should seek further medical care if you have more pain with urination, increasing shortness of breath, fevers, or chills.  We recommend that you see your primary care doctor in about a week to make sure that you continue to improve. We are so glad that you are feeling better.  Sincerely, [name], MD    Information on my medicine - ELIQUIS (apixaban)  This medication education was reviewed with me or my healthcare representative as part of my discharge preparation.  Why was Eliquis prescribed for you? Eliquis was prescribed for you to reduce the risk of a blood clot forming that can cause a stroke if you have a medical condition called atrial fibrillation (a type of irregular heartbeat).  What do You need to know about Eliquis ? Take your Eliquis TWICE DAILY - one tablet in the morning and one tablet in the evening with or without food. If you have difficulty swallowing the tablet whole please discuss  with your pharmacist how to take the medication safely.  Take Eliquis exactly as prescribed by your doctor and DO NOT stop taking Eliquis without talking to the doctor who prescribed the medication.  Stopping may increase your risk of developing a stroke.  Refill your prescription before you run out.  After discharge, you should have regular check-up appointments with your healthcare provider that is prescribing your Eliquis.  In the future your dose may need to be changed if your kidney function or weight changes by a significant amount or as you get older.  What do you do if you miss a dose? If you miss a dose, take it as soon as you remember on the same day and resume taking twice daily.  Do not take more than one dose of ELIQUIS at the same time to make up a missed dose.  Important Safety Information A possible side effect of Eliquis is bleeding. You should call your healthcare provider right away if you experience any of the following: Bleeding from an injury or your nose that does not stop. Unusual colored urine (red or dark brown) or unusual colored stools (red or black). Unusual bruising for unknown reasons. A serious fall or if you hit your head (even if there is no bleeding).  Some medicines may interact with Eliquis and might increase your risk of bleeding or clotting while on Eliquis. To help avoid this, consult your healthcare provider or  pharmacist prior to using any new prescription or non-prescription medications, including herbals, vitamins, non-steroidal anti-inflammatory drugs (NSAIDs) and supplements.  This website has more information on Eliquis (apixaban): http://www.eliquis.com/eliquis/home

## 2021-12-19 NOTE — TOC Progression Note (Signed)
Transition of Care [facility]) - Progression Note    Patient Details  Name: Katelyn Mahoney MRN: [id number] Date of Birth: 09-17-1934  Transition of Care [facility]) CM/SW Contact  Emeterio Reeve, [place] Phone Number: 12/19/2021, 3:16 PM  Clinical Narrative:     Pt was accepted [facility]. CSW informed [facility] they will have to start insurance auth. CSW will continue to follow.    Expected Discharge Plan: [place] Barriers to Discharge: Continued Medical Work up, Ship broker  Expected Discharge Plan and Services Expected Discharge Plan: [place] In-house Referral: Clinical Social Work     Living arrangements for the past 2 months: Single Family Home                                       Social Determinants of Health (SDOH) Interventions    Readmission Risk Interventions No flowsheet data found.  Emeterio Reeve, LCSW Clinical Social Worker

## 2021-12-19 NOTE — Progress Notes (Signed)
Mobility Specialist Progress Note:   12/19/21 1230  Mobility  Activity Ambulated in room  Level of Assistance Standby assist, set-up cues, supervision of patient - no hands on  Assistive Device Front wheel walker  Distance Ambulated (ft) 20 ft  Mobility Ambulated with assistance in room  Mobility Response Tolerated well  Mobility performed by Mobility specialist  Bed Position Chair  $Mobility charge 1 Mobility   Pt with SOB during ambulation. Distance limited secondary to fatigue. Pt back in chair with chair alarm on.   Nelta Numbers Mobility Specialist  Phone [phone]

## 2021-12-19 NOTE — Progress Notes (Signed)
HD#7 Subjective:  Overnight Events: NAEO   Patient says she still feels short of breath today, she thinks it might be related to her nose being stuffed up some. Has not had a breathing treatment since yesterday. She was able to sit in the chair most of the day yesterday and will continue to use the spirometer. She doesn't love the idea of going to a SNF because she is afraid to get COVID but she will do it if she needs to.  Objective:  Vital signs in last 24 hours: Vitals:   12/18/21 1605 12/18/21 1934 12/19/21 0418 12/19/21 0500  BP: (!) 125/55 (!) 101/43 124/86   Pulse: (!) 59 60 (!) 52   Resp: 17 19 18    Temp: (!) 97.5 F (36.4 C) 97.9 F (36.6 C) 97.7 F (36.5 C)   TempSrc: Oral Oral Oral   SpO2: 94% 98% 94%   Weight:    131.5 kg  Height:       Supplemental O2: Nasal Cannula SpO2: 94 % O2 Flow Rate (L/min): 4 L/min   Physical Exam:  Constitutional: obese elderly woman resting comfortably in bed, in no acute distress   Cardiovascular:irregularly irregular rhythm, no m/r/g, 1+ pitting edema of the BLE, dependent edema of the upper thighs Pulmonary/Chest: normal WOB on 3 L [place]. Expiratory wheezing, less bibasilar crackles Abdominal: soft, non-tender, distended MSK: normal bulk and tone Neurological: alert & oriented x 3, mild resting tremor of the left hand Skin: warm and dry Psych: normal affect  Filed Weights   12/17/21 0500 12/18/21 0500 12/19/21 0500  Weight: 132.8 kg 128.9 kg 131.5 kg     Intake/Output Summary (Last 24 hours) at 12/19/2021 0710 Last data filed at 12/19/2021 0415 Gross per 24 hour  Intake 1450 ml  Output 501 ml  Net 949 ml   Net IO Since Admission: 634.92 mL [12/19/21 0710]  Pertinent Labs: CBC Latest Ref Rng & Units 12/19/2021 12/18/2021 12/17/2021  WBC 4.0 - 10.5 K/uL 15.6(H) 15.2(H) 14.4(H)  Hemoglobin 12.0 - 15.0 g/dL 12.4 12.3 11.5(L)  Hematocrit 36.0 - 46.0 % 37.3 35.8(L) 35.6(L)  Platelets 150 - 400 K/uL 527(H) 448(H) 388     CMP Latest Ref Rng & Units 12/19/2021 12/18/2021 12/17/2021  Glucose 70 - 99 mg/dL 108(H) 118(H) 127(H)  BUN 8 - 23 mg/dL 41(H) 42(H) 38(H)  Creatinine 0.44 - 1.00 mg/dL 1.59(H) 1.46(H) 1.38(H)  Sodium 135 - 145 mmol/L 139 137 138  Potassium 3.5 - 5.1 mmol/L 3.5 3.7 4.2  Chloride 98 - 111 mmol/L 103 103 104  CO2 22 - 32 mmol/L 27 24 26   Calcium 8.9 - 10.3 mg/dL 8.6(L) 8.4(L) 8.8(L)  Total Protein 6.5 - 8.1 g/dL - - -  Total Bilirubin 0.3 - 1.2 mg/dL - - -  Alkaline Phos 38 - 126 U/L - - -  AST 15 - 41 U/L - - -  ALT 0 - 44 U/L - - -    Imaging: No results found.  Assessment/Plan:   Principal Problem:   Urinary tract infection Active Problems:   Diabetes mellitus ([place])   CKD stage G3b/A1, GFR 30-44 and albumin creatinine ratio <30 mg/g (HCC)   Acute kidney injury superimposed on chronic kidney disease (HCC)   At risk for sepsis   Acute congestive heart failure [facility])   Patient Summary: Katelyn Mahoney is a 87 y.o.  with pertinent PMH of DMII, HTN, obesity, CKD, and chronic lower extremity edema is presenting with a 1 week of dyspnea  with associated cough admitted for Gram negative rod bacteremia.   #Proteus bacteremia due to UTI #Sepsis ruled in Patient's blood cultures grew proteus species, sensitive to cefazolin. WBC has been improving/stable around 14-15. Dysuria has also improved. Given that she is still not making drastic clinical improvement will continue with IV antibiotics to finish out her course. PT/OT have also changed their recommendations to SNF. -Continue cefazolin 2g Q8, planning for 7-day course of antibiotics (finishing 12/20) - daily CBC - SNF placement   #Atrial fibrillation #Grade II diastolic Dysfunction; elevated pulmonary artery pressures Patient short of breath with LEE and a history of lasix on home medications though no clear documentation for why she takes this medication. Echo showed EF of 50-55% with low normal function and elevated PAP of  49.1. Holding off on further doses of lasix at this time. Continue to encourage patient to get up into the chair. - CHADsVASC 6, HASBLED 2 - eliquis 2.5 BID - incentive spirometry - albuterol as needed for wheezing - wean oxygen as able - telemetry - hold off on lasix - continue metoprolol 100 daily   Acute on Chronic Kidney Disease stage 3b Creatinine bumped to 1.46 from 1.38 after diuresis, this continues to rise to 1.59 on 12/20. Will encourage patient to have good PO intake  today. - Avoiding nephrotoxic agents.  - daily bmp   #DMII Last A1c in November was 7.4. Takes insulin and ozempic at home. Back on home dose of long acting insulin and sugars have improved - semglee 35 units and novolog 6 TID with meals - SSI - holding gabapentin   #HTN - Continue home amlodipine 5   #HLD - continue home lipitor 20 daily   #Depression - Continue sertraline 100 mg daily   #B12 Deficiency  Low at 158. Will continue oral supplementation   #Hypocalcemia; resolved   Diet: Heart Healthy VTE: Eliquis Code: DNR/DNI    [name], MD Internal Medicine Resident PGY-1 Pager [phone] Please contact the on call pager after 5 pm and on weekends at [phone].

## 2021-12-20 LAB — CBC
HCT: 37.7 % (ref 36.0–46.0)
Hemoglobin: 12.4 g/dL (ref 12.0–15.0)
MCH: 33.4 pg (ref 26.0–34.0)
MCHC: 32.9 g/dL (ref 30.0–36.0)
MCV: 101.6 fL — ABNORMAL HIGH (ref 80.0–100.0)
Platelets: 557 10*3/uL — ABNORMAL HIGH (ref 150–400)
RBC: 3.71 MIL/uL — ABNORMAL LOW (ref 3.87–5.11)
RDW: 14.6 % (ref 11.5–15.5)
WBC: 15.7 10*3/uL — ABNORMAL HIGH (ref 4.0–10.5)
nRBC: 0.4 % — ABNORMAL HIGH (ref 0.0–0.2)

## 2021-12-20 LAB — BASIC METABOLIC PANEL
Anion gap: 7 (ref 5–15)
BUN: 40 mg/dL — ABNORMAL HIGH (ref 8–23)
CO2: 26 mmol/L (ref 22–32)
Calcium: 8.5 mg/dL — ABNORMAL LOW (ref 8.9–10.3)
Chloride: 105 mmol/L (ref 98–111)
Creatinine, Ser: 1.73 mg/dL — ABNORMAL HIGH (ref 0.44–1.00)
GFR, Estimated: 29 mL/min — ABNORMAL LOW (ref >60)
Glucose, Bld: 88 mg/dL (ref 70–99)
Potassium: 3.4 mmol/L — ABNORMAL LOW (ref 3.5–5.1)
Sodium: 138 mmol/L (ref 135–145)

## 2021-12-20 LAB — GLUCOSE, CAPILLARY
Glucose-Capillary: 106 mg/dL — ABNORMAL HIGH (ref 70–99)
Glucose-Capillary: 108 mg/dL — ABNORMAL HIGH (ref 70–99)
Glucose-Capillary: 114 mg/dL — ABNORMAL HIGH (ref 70–99)
Glucose-Capillary: 133 mg/dL — ABNORMAL HIGH (ref 70–99)
Glucose-Capillary: 151 mg/dL — ABNORMAL HIGH (ref 70–99)
Glucose-Capillary: 97 mg/dL (ref 70–99)

## 2021-12-20 LAB — SARS CORONAVIRUS 2 (TAT 6-24 HRS): SARS Coronavirus 2: NEGATIVE

## 2021-12-20 MED ORDER — POTASSIUM CHLORIDE CRYS ER 20 MEQ PO TBCR
20.0000 meq | EXTENDED_RELEASE_TABLET | Freq: Once | ORAL | Status: AC
  Administered 2021-12-20: 08:00:00 20 meq via ORAL
  Filled 2021-12-20: qty 1

## 2021-12-20 NOTE — Progress Notes (Signed)
Mobility Specialist Progress Note:   12/20/21 1600  Mobility  Activity Ambulated in room;Ambulated to bathroom  Level of Assistance Contact guard assist, steadying assist  Assistive Device Front wheel walker  Distance Ambulated (ft) 70 ft  Mobility Ambulated with assistance in room  Mobility Response Tolerated fair  Mobility performed by Mobility specialist;Other (comment) (OT [place])  Bed Position Chair  $Mobility charge 1 Mobility    Pt not requiring any physical assist to stand, or during ambulation. Did require x1 seated rest break during amb, d/t fatigue/SOB. SpO2 dropped to 89% on 4LO2. Pt left in chair with chair alarm on.   Nelta Numbers Mobility Specialist  Phone [phone]

## 2021-12-20 NOTE — Progress Notes (Signed)
HD#8 Subjective:  Overnight Events: NAEO   Patient says that she needs to use the bathroom, hesitant to get out of bed to use the bathroom but willing to do some with encouragement. She is continuing to use the incentive spirometer. She really wants to go home soon but knows she is feeling weak.  Objective:  Vital signs in last 24 hours: Vitals:   12/19/21 1649 12/19/21 2013 12/20/21 0440 12/20/21 0741  BP: (!) 118/97 (!) 121/57 (!) 110/54 121/83  Pulse: (!) 50 (!) 55 (!) 51 (!) 55  Resp: 18 (!) 22 (!) 22 18  Temp: 98.4 F (36.9 C) 98.2 F (36.8 C) 97.7 F (36.5 C) 98.1 F (36.7 C)  TempSrc: Oral Oral Oral   SpO2: 98% 99% 98% 98%  Weight:   132.3 kg   Height:       Supplemental O2: Nasal Cannula SpO2: 98 % O2 Flow Rate (L/min): 3 L/min   Physical Exam:  Constitutional: obese elderly woman resting comfortably in bed, in no acute distress   Cardiovascular:irregularly irregular rhythm, 1+ pitting edema of the BLE, dependent edema of the upper thighs Pulmonary/Chest: normal WOB on 3 L [place].  Abdominal: soft, non-tender, distended MSK: normal bulk and tone Neurological: alert & oriented x 3, mild resting tremor of the left hand Skin: warm and dry Psych: normal affect  Filed Weights   12/18/21 0500 12/19/21 0500 12/20/21 0440  Weight: 128.9 kg 131.5 kg 132.3 kg     Intake/Output Summary (Last 24 hours) at 12/20/2021 1015 Last data filed at 12/20/2021 0856 Gross per 24 hour  Intake 720 ml  Output 450 ml  Net 270 ml   Net IO Since Admission: 754.92 mL [12/20/21 1015]  Pertinent Labs: CBC Latest Ref Rng & Units 12/20/2021 12/19/2021 12/18/2021  WBC 4.0 - 10.5 K/uL 15.7(H) 15.6(H) 15.2(H)  Hemoglobin 12.0 - 15.0 g/dL 12.4 12.4 12.3  Hematocrit 36.0 - 46.0 % 37.7 37.3 35.8(L)  Platelets 150 - 400 K/uL 557(H) 527(H) 448(H)    CMP Latest Ref Rng & Units 12/20/2021 12/19/2021 12/18/2021  Glucose 70 - 99 mg/dL 88 108(H) 118(H)  BUN 8 - 23 mg/dL 40(H) 41(H) 42(H)   Creatinine 0.44 - 1.00 mg/dL 1.73(H) 1.59(H) 1.46(H)  Sodium 135 - 145 mmol/L 138 139 137  Potassium 3.5 - 5.1 mmol/L 3.4(L) 3.5 3.7  Chloride 98 - 111 mmol/L 105 103 103  CO2 22 - 32 mmol/L 26 27 24   Calcium 8.9 - 10.3 mg/dL 8.5(L) 8.6(L) 8.4(L)  Total Protein 6.5 - 8.1 g/dL - - -  Total Bilirubin 0.3 - 1.2 mg/dL - - -  Alkaline Phos 38 - 126 U/L - - -  AST 15 - 41 U/L - - -  ALT 0 - 44 U/L - - -    Imaging: No results found.  Assessment/Plan:   Principal Problem:   Urinary tract infection Active Problems:   Diabetes mellitus ([place])   CKD stage G3b/A1, GFR 30-44 and albumin creatinine ratio <30 mg/g (HCC)   Acute kidney injury superimposed on chronic kidney disease (HCC)   At risk for sepsis   Acute congestive heart failure ([place])   Patient Summary: Katelyn Mahoney is a 87 y.o. with pertinent PMH of DMII, HTN, obesity, CKD, and chronic lower extremity edema is presenting with a 1 week of dyspnea with associated cough admitted for Gram negative rod bacteremia now finished with antibiotics   #Proteus bacteremia due to UTI; resolved #Sepsis; resolved Patient's blood cultures grew proteus species,  sensitive to cefazolin. WBC has been stable at 15. She has finished with her course of antibiotics. PT/OT are  recommending to SNF. Patient still shows reluctance to get out of bed to use the bathroom endorsing fatigue/weakness after her acute illness. Will encourage activity as patient is at risk for deconditioning. -Continue cefazolin 2g Q8, planning for 7-day course of antibiotics (finishing 12/20) - daily CBC - SNF placement - encourage ambulation/mobilization   #Atrial fibrillation #Grade II diastolic Dysfunction; elevated pulmonary artery pressures Patient short of breath with LEE and a history of lasix on home medications though no clear documentation for why she takes this medication. Echo showed EF of 50-55% with low normal function and elevated PAP of 49.1. Holding off on  further doses of lasix at this time. Continue to encourage patient to get up into the chair. - CHADsVASC 6, HASBLED 2 - eliquis 2.5 BID - incentive spirometry - albuterol as needed for wheezing - wean oxygen as able - telemetry - hold off on lasix - continue metoprolol 100 daily   Acute on Chronic Kidney Disease stage 3b Creatinine bumped to 1.46 from 1.38 after diuresis, this continues to rise to 1.59 on 12/20. Will encourage patient to have good PO intake  today. - Avoiding nephrotoxic agents.  - daily bmp   #DMII Last A1c in November was 7.4. Takes insulin and ozempic at home. Back on home dose of long acting insulin and sugars have improved - semglee 35 units and novolog 6 TID with meals - SSI - holding gabapentin   #HTN - Continue home amlodipine 5   #HLD - continue home lipitor 20 daily   #Depression - Continue sertraline 100 mg daily   #B12 Deficiency  Low at 158. Will continue oral supplementation   #Hypocalcemia; resolved   Diet: Heart Healthy VTE: Eliquis Code: DNR/DNI  [name], MD Internal Medicine Resident PGY-1 Pager [phone] Please contact the on call pager after 5 pm and on weekends at [phone].

## 2021-12-20 NOTE — TOC Progression Note (Signed)
Transition of Care [facility]) - Progression Note    Patient Details  Name: Katelyn Mahoney MRN: [id number] Date of Birth: Jul 31, 1934  Transition of Care [facility]) CM/SW Contact  Emeterio Reeve, [place] Phone Number: 12/20/2021, 3:34 PM  Clinical Narrative:     CSW received message that Pt has [employer]BS medicare [employer] and [facility] is not in network with this carrier. CSW explained to pt that she wil either have to go to a different SNF, Pay out of pocket, or return home with HHPT. Pt stated that she would not go to a different SNF. Pt stated she cannot afford to pay out of pocket so she guesses she will go home.   Pt reports she lives with her daughter but she works during the day. Pt stated she might can have some friends take turns checking on her during the day. Pt has no preferences in [facility] agency at this time.  Pt states she has a walker, rollator, and shower chair at home.   CSW attempted to call pts daughter and grandson but both numbers where out of services.   Expected Discharge Plan: [place] Barriers to Discharge: Continued Medical Work up, Ship broker  Expected Discharge Plan and Services Expected Discharge Plan: [place] In-house Referral: Clinical Social Work     Living arrangements for the past 2 months: Single Family Home                                       Social Determinants of Health (SDOH) Interventions    Readmission Risk Interventions No flowsheet data found.  Emeterio Reeve, LCSW Clinical Social Worker

## 2021-12-20 NOTE — Progress Notes (Signed)
Collected Covid swab and sent to lab

## 2021-12-20 NOTE — Progress Notes (Addendum)
SATURATION QUALIFICATIONS: (This note is used to comply with regulatory documentation for home oxygen)  Patient Saturations on 3 Liters of oxygen at Rest = 92%  Patient Saturations on 3 Liters of oxygen while Ambulating = 76%  Patient Saturations on 4 Liters of oxygen while Ambulating = 89%  Please briefly explain why patient needs home oxygen: Pt SpO2 dropping down to 76% during light standing task on 3L. Titrated to 4L and recovered to 97% at rest, however continuing to drop to 89% after ambulating short distances (approx. 10 ft).   Sylver Vantassell C, OT/L  Acute Rehab [phone]

## 2021-12-20 NOTE — TOC Initial Note (Signed)
Transition of Care [facility]) - Initial/Assessment Note    Patient Details  Name: Katelyn Mahoney MRN: [id number] Date of Birth: [birth year]  Transition of Care [facility]) CM/SW Contact:    Marilu Favre, RN Phone Number: 12/20/2021, 4:00 PM  Clinical Narrative:                 See previous TOC note. Patient from home with daughter Mariann Laster. Patient wants to return to home now with home health PT/OT.   Patient stated she has explained to her daughter Sheria Lang [phone] .   Patient has walker, rollator, and shower chair at home already.   MD ordered 3 in 1 , patient will need 3 inq. Patient may need home oxygen . Therapy in room with patient now , NCM asked therapy for ambulatory oxygen saturation  note.   NCM spoke to MD discharge is planned for tomorrow. Patient aware. NCM will wait to see if home oxygen needed and then order DME with [place].   NCM left Tommi Rumps with [facility] message regarding HHPT/OT awaiting call back   Expected Discharge Plan: [place] Barriers to Discharge: Continued Medical Work up, Ship broker   Patient Goals and CMS Choice Patient states their goals for this hospitalization and ongoing recovery are:: to return to home CMS Medicare.gov Compare Post Acute Care list provided to:: Patient Choice offered to / list presented to : Patient  Expected Discharge Plan and Services Expected Discharge Plan: [place] In-house Referral: Clinical Social Work Discharge Planning Services: CM Consult Post Acute Care Choice: [place] arrangements for the past 2 months: [place]                 DME Arranged: 3-N-1         [place] Arranged: PT, OT HH Agency: [place] Date [facility] Agency Contacted: 12/20/21 Time HH Agency Contacted: 1600 Representative spoke with at [place]: Tommi Rumps  Prior Living Arrangements/Services Living arrangements for the past 2 months: [place]  with:: Adult Children Patient language and need for interpreter reviewed:: Yes Do you feel safe going back to the place where you live?: Yes      Need for Family Participation in Patient Care: Yes (Comment) Care giver support system in place?: Yes (comment) Current home services: DME Criminal Activity/Legal Involvement Pertinent to Current Situation/Hospitalization: No - Comment as needed  Activities of Daily Living Home Assistive Devices/Equipment: Environmental consultant (specify type), Built-in shower seat, Cane (specify quad or straight) ADL Screening (condition at time of admission) Patient's cognitive ability adequate to safely complete daily activities?: Yes Is the patient deaf or have difficulty hearing?: No Does the patient have difficulty seeing, even when wearing glasses/contacts?: No Does the patient have difficulty concentrating, remembering, or making decisions?: No Patient able to express need for assistance with ADLs?: No Does the patient have difficulty dressing or bathing?: Yes Independently performs ADLs?: Yes (appropriate for developmental age) Does the patient have difficulty walking or climbing stairs?: Yes Weakness of Legs: Both Weakness of Arms/Hands: None  Permission Sought/Granted Permission sought to share information with : Case Manager, Customer service manager, Family Supports Permission granted to share information with : No  Share Information with NAME: Sheria Lang  Permission granted to share info w AGENCY: SNFs  Permission granted to share info w Relationship: Daughter  Permission granted to share info w Contact Information: [phone]  Emotional Assessment Appearance:: Appears stated age Attitude/Demeanor/Rapport: Engaged Affect (  typically observed): Accepting Orientation: : Oriented to Self, Oriented to Place, Oriented to  Time, Oriented to Situation Alcohol / Substance Use: Not Applicable Psych Involvement: No (comment)  Admission diagnosis:  NSTEMI  (non-ST elevated myocardial infarction) ([place]) [I21.4] SIRS (systemic inflammatory response syndrome) (HCC) [R65.10] Acute respiratory failure with hypoxia ([place]) [J96.01] AKI (acute kidney injury) ([place]) [N17.9] Sepsis ([place]) [A41.9] Acute congestive heart failure, unspecified heart failure type ([place]) [I50.9] Patient Active Problem List   Diagnosis Date Noted   Acute congestive heart failure ([place])    Urinary tract infection 12/13/2021   Acute kidney injury superimposed on chronic kidney disease ([place]) 12/13/2021   At risk for sepsis 12/13/2021   Primary osteoarthritis of both knees 03/09/2021   Urinary frequency 03/09/2021   Depression, major, single episode, in partial remission ([place]) 03/09/2021   Edema of lower extremity 08/20/2017   Hyperlipidemia 08/20/2017   CKD stage G3b/A1, GFR 30-44 and albumin creatinine ratio <30 mg/g (HCC) 02/27/2017   Allergic urticaria 08/02/2016   Fungal dermatitis 11/08/2015   Right leg pain 10/14/2015   Physical exam 11/09/2014   Dermatophytosis of nail 09/24/2013   Arthritis    Claw toe (acquired) 04/17/2013   Hammer toe, acquired 04/17/2013   Spherocytosis, hereditary ([place]) 08/30/2011   Diabetes mellitus ([place]) 10/17/2010   MORBID OBESITY 03/19/2008   Primary hypertension 03/19/2008   ROTATOR CUFF SPRAIN AND STRAIN 03/19/2008   Hyperlipidemia associated with type 2 diabetes mellitus ([place]) 09/05/2007   Depression 09/05/2007   ROSACEA 05/19/2007   PCP:  Ann Held, DO Pharmacy:   [facility] [place], [place] - [address] [place] [address] Phone: [phone] Fax: [phone]     Social Determinants of Health (SDOH) Interventions    Readmission Risk Interventions No flowsheet data found.

## 2021-12-20 NOTE — Care Management Important Message (Signed)
Important Message  Patient Details  Name: Katelyn Mahoney MRN: [id number] Date of Birth: October 13, 1934   Medicare Important Message Given:  Yes     Hannah Beat 12/20/2021, 2:01 PM

## 2021-12-20 NOTE — Progress Notes (Addendum)
Occupational Therapy Treatment Patient Details Name: Katelyn Mahoney MRN: [id number] DOB: Oct 16, 1934 Today's Date: 12/20/2021   History of present illness Pt is 87 y/o F presenting with fever and acute hypoxic respiratory failure, found to have UTI. PMH includes DM II, HTN, obesity, CKD, HLD, chronic LE edema.   OT comments  Pt is slowly progressing towards OT goals. During session, pt completed standing grooming, toilet transfer, toilet hygiene, and functional mobility with Min guard, requiring frequent rest breaks. Per Cm, pt will be d/c'ing home rather than SNF, in which case she will need HHOT. Will continue to follow.   Recommendations for follow up therapy are one component of a multi-disciplinary discharge planning process, led by the attending physician.  Recommendations may be updated based on patient status, additional functional criteria and insurance authorization.    Follow Up Recommendations  Home health OT    Assistance Recommended at Discharge Frequent or constant Supervision/Assistance  Equipment Recommendations  BSC/3in1    Recommendations for Other Services      Precautions / Restrictions Precautions Precautions: Fall;Other (comment) Precaution Comments: Monitor vitals- O2 Restrictions Weight Bearing Restrictions: No       Mobility Bed Mobility               General bed mobility comments: In recliner on arrival    Transfers Overall transfer level: Needs assistance Equipment used: Rolling walker (2 wheels) Transfers: Sit to/from Stand Sit to Stand: Min guard                 Balance Overall balance assessment: Needs assistance Sitting-balance support: Feet supported Sitting balance-Leahy Scale: Fair     Standing balance support: Bilateral upper extremity supported Standing balance-Leahy Scale: Poor                             ADL either performed or assessed with clinical judgement   ADL Overall ADL's : Needs  assistance/impaired     Grooming: Supervision/safety;Standing;Brushing hair;Wash/dry face;Wash/dry Nurse, mental health Details (indicate cue type and reason): At sink                 Toilet Transfer: Min guard;Ambulation;Regular Toilet;Rolling walker (2 wheels);Grab bars   Toileting- Clothing Manipulation and Hygiene: Min guard;Sit to/from stand       Functional mobility during ADLs: Min guard      Extremity/Trunk Assessment              Vision       Perception     Praxis      Cognition Arousal/Alertness: Awake/alert Behavior During Therapy: WFL for tasks assessed/performed Overall Cognitive Status: Within Functional Limits for tasks assessed                                            Exercises     Shoulder Instructions       General Comments      Pertinent Vitals/ Pain       Pain Assessment: No/denies pain  Home Living                                          Prior Functioning/Environment              Frequency  Min 2X/week  Progress Toward Goals  OT Goals(current goals can now be found in the care plan section)  Progress towards OT goals: Progressing toward goals  Acute Rehab OT Goals Patient Stated Goal: return home OT Goal Formulation: With patient Time For Goal Achievement: 12/29/21 Potential to Achieve Goals: Good ADL Goals Pt Will Perform Lower Body Bathing: with modified independence Pt Will Perform Lower Body Dressing: with modified independence Pt Will Transfer to Toilet: with modified independence;ambulating  Plan Discharge plan remains appropriate;Frequency remains appropriate    Co-evaluation                 AM-PAC OT "6 Clicks" Daily Activity     Outcome Measure   Help from another person eating meals?: None Help from another person taking care of personal grooming?: None Help from another person toileting, which includes using toliet, bedpan, or urinal?: A  Little Help from another person bathing (including washing, rinsing, drying)?: A Lot Help from another person to put on and taking off regular upper body clothing?: A Little Help from another person to put on and taking off regular lower body clothing?: A Lot 6 Click Score: 18    End of Session Equipment Utilized During Treatment: Rolling walker (2 wheels);Oxygen  OT Visit Diagnosis: Unsteadiness on feet (R26.81);Muscle weakness (generalized) (M62.81)   Activity Tolerance Patient tolerated treatment well;Patient limited by fatigue   Patient Left in chair;with call bell/phone within reach;with chair alarm set   Nurse Communication Mobility status        Time: 5366-4403 OT Time Calculation (min): 29 min  Charges: OT General Charges $OT Visit: 1 Visit OT Treatments $Self Care/Home Management : 23-37 mins  Donae Kueker C, OT/L  Acute Rehab [place] 12/20/2021, 5:21 PM

## 2021-12-20 NOTE — Progress Notes (Signed)
Mobility Specialist Progress Note:   12/20/21 1215  Mobility  Activity Ambulated in room  Level of Assistance Standby assist, set-up cues, supervision of patient - no hands on  Assistive Device Front wheel walker  Distance Ambulated (ft) 40 ft  Mobility Ambulated with assistance in room  Mobility Response Tolerated fair  Mobility performed by Mobility specialist  Bed Position Chair  $Mobility charge 1 Mobility   Session performed on 3LO2. Pt c/o generalized weakness with room distances. States BUE/BLE feel weak and she gets SOB even with short distances. Requested to ambulate again later today, will f/u with pt. Pt back in chair with chair alarm on.   Nelta Numbers Mobility Specialist  Phone [phone]

## 2021-12-21 LAB — CBC
HCT: 38.5 % (ref 36.0–46.0)
Hemoglobin: 12.4 g/dL (ref 12.0–15.0)
MCH: 33.1 pg (ref 26.0–34.0)
MCHC: 32.2 g/dL (ref 30.0–36.0)
MCV: 102.7 fL — ABNORMAL HIGH (ref 80.0–100.0)
Platelets: 541 10*3/uL — ABNORMAL HIGH (ref 150–400)
RBC: 3.75 MIL/uL — ABNORMAL LOW (ref 3.87–5.11)
RDW: 14.8 % (ref 11.5–15.5)
WBC: 13.6 10*3/uL — ABNORMAL HIGH (ref 4.0–10.5)
nRBC: 0.4 % — ABNORMAL HIGH (ref 0.0–0.2)

## 2021-12-21 LAB — GLUCOSE, CAPILLARY
Glucose-Capillary: 117 mg/dL — ABNORMAL HIGH (ref 70–99)
Glucose-Capillary: 129 mg/dL — ABNORMAL HIGH (ref 70–99)
Glucose-Capillary: 161 mg/dL — ABNORMAL HIGH (ref 70–99)
Glucose-Capillary: 186 mg/dL — ABNORMAL HIGH (ref 70–99)
Glucose-Capillary: 92 mg/dL (ref 70–99)
Glucose-Capillary: 98 mg/dL (ref 70–99)

## 2021-12-21 LAB — BASIC METABOLIC PANEL
Anion gap: 6 (ref 5–15)
BUN: 37 mg/dL — ABNORMAL HIGH (ref 8–23)
CO2: 26 mmol/L (ref 22–32)
Calcium: 8.5 mg/dL — ABNORMAL LOW (ref 8.9–10.3)
Chloride: 106 mmol/L (ref 98–111)
Creatinine, Ser: 1.55 mg/dL — ABNORMAL HIGH (ref 0.44–1.00)
GFR, Estimated: 33 mL/min — ABNORMAL LOW (ref >60)
Glucose, Bld: 100 mg/dL — ABNORMAL HIGH (ref 70–99)
Potassium: 3.7 mmol/L (ref 3.5–5.1)
Sodium: 138 mmol/L (ref 135–145)

## 2021-12-21 MED ORDER — FUROSEMIDE 40 MG PO TABS
40.0000 mg | ORAL_TABLET | Freq: Every day | ORAL | Status: DC
  Administered 2021-12-21 – 2021-12-24 (×4): 40 mg via ORAL
  Filled 2021-12-21 (×4): qty 1

## 2021-12-21 NOTE — Progress Notes (Signed)
Mobility Specialist Progress Note:   12/21/21 1100  Mobility  Activity Ambulated in room  Level of Assistance Standby assist, set-up cues, supervision of patient - no hands on  Assistive Device Front wheel walker  Distance Ambulated (ft) 80 ft  Mobility Ambulated with assistance in room  Mobility Response Tolerated fair  Mobility performed by Mobility specialist  Bed Position Chair  $Mobility charge 1 Mobility   Session performed on 3.5LO2. Required standbyA to stand and during ambulation. SpO2 dropped to 88% during ambulation before requiring a seated rest break. After break, SpO2 stayed above 90% for the duration of session. Pt left in chair with chair alarm on.   Nelta Numbers Mobility Specialist  Phone [phone]

## 2021-12-21 NOTE — TOC Progression Note (Signed)
Transition of Care [facility]) - Progression Note    Patient Details  Name: Katelyn Mahoney MRN: [id number] Date of Birth: [birth year]  Transition of Care [facility]) CM/SW Contact  Jacalyn Lefevre Edson Snowball, RN Phone Number: 12/21/2021, 8:42 AM  Clinical Narrative:     Ordered 3 in 1 with Freda Munro with [place]. Equipment will be delivered to hospital room this morning prior to discharge.    Tommi Rumps with [facility] accepted referral for HHPT/OT   Expected Discharge Plan: [place] Barriers to Discharge: Continued Medical Work up, Ship broker  Expected Discharge Plan and Services Expected Discharge Plan: [place] In-house Referral: Clinical Social Work Discharge Planning Services: CM Consult Post Acute Care Choice: [place] arrangements for the past 2 months: Single Family Home                 DME Arranged: 3-N-1         [place] Arranged: PT, OT HH Agency: [place] Date [facility] Agency Contacted: 12/20/21 Time HH Agency Contacted: 1600 Representative spoke with at [place]: [place] ([place]) Interventions    Readmission Risk Interventions No flowsheet data found.

## 2021-12-21 NOTE — Progress Notes (Signed)
Physical Therapy Treatment Patient Details Name: Katelyn Mahoney MRN: [id number] DOB: [birth year] Today's Date: 12/21/2021   History of Present Illness Pt is 87 y/o F presenting with fever and acute hypoxic respiratory failure, found to have UTI. PMH includes DM II, HTN, obesity, CKD, HLD, chronic LE edema.    PT Comments    Pt seated in recliner and eager to mobilize.  She remains limited due to poor endurance and increased WOB.  SPO2 stable on 4L.  HR elevated to 122 bpm with limited activity.  Continue to recommend rehab in a post acute setting.     Recommendations for follow up therapy are one component of a multi-disciplinary discharge planning process, led by the attending physician.  Recommendations may be updated based on patient status, additional functional criteria and insurance authorization.  Follow Up Recommendations  Skilled nursing-short term rehab (<3 hours/day)     Assistance Recommended at Discharge Intermittent Supervision/Assistance  Equipment Recommendations  None recommended by PT    Recommendations for Other Services       Precautions / Restrictions Precautions Precautions: Fall;Other (comment) Precaution Comments: Monitor vitals- O2 Restrictions Weight Bearing Restrictions: No     Mobility  Bed Mobility               General bed mobility comments: Pt seated in recliner on arrival this session.    Transfers Overall transfer level: Needs assistance Equipment used: Rolling walker (2 wheels) Transfers: Sit to/from Stand Sit to Stand: Supervision           General transfer comment: Cues for hand placement to and from seated surface.    Ambulation/Gait Ambulation/Gait assistance: Supervision Gait Distance (Feet): 22 Feet (x2) Assistive device: Rolling walker (2 wheels) Gait Pattern/deviations: Step-through pattern;Decreased stride length;Wide base of support;Antalgic;Trunk flexed       General Gait Details: 4L [place] SPO2 95%  throughout.  She fatigues quickly cues for upper trunk control.   Stairs             Wheelchair Mobility    Modified Rankin (Stroke Patients Only)       Balance Overall balance assessment: Needs assistance Sitting-balance support: Feet supported Sitting balance-Leahy Scale: Fair       Standing balance-Leahy Scale: Poor Standing balance comment: Pt relies heavily on RW for balance and support                            Cognition Arousal/Alertness: Awake/alert Behavior During Therapy: WFL for tasks assessed/performed Overall Cognitive Status: Within Functional Limits for tasks assessed                                          Exercises General Exercises - Lower Extremity Ankle Circles/Pumps: AROM;Both;10 reps;Seated Long Arc Quad: AROM;Both;10 reps;Seated Hip Flexion/Marching: AROM;Both;10 reps;Seated    General Comments        Pertinent Vitals/Pain Pain Assessment: No/denies pain    Home Living                          Prior Function            PT Goals (current goals can now be found in the care plan section) Acute Rehab PT Goals Patient Stated Goal: to go homeafter rehab Potential to Achieve Goals: Good Progress towards PT goals: Progressing toward goals  Frequency    Min 2X/week      PT Plan Frequency needs to be updated    Co-evaluation              AM-PAC PT "6 Clicks" Mobility   Outcome Measure  Help needed turning from your back to your side while in a flat bed without using bedrails?: A Little Help needed moving from lying on your back to sitting on the side of a flat bed without using bedrails?: A Little Help needed moving to and from a bed to a chair (including a wheelchair)?: A Little Help needed standing up from a chair using your arms (e.g., wheelchair or bedside chair)?: A Little Help needed to walk in hospital room?: A Little Help needed climbing 3-5 steps with a railing? : A  Little 6 Click Score: 18    End of Session Equipment Utilized During Treatment: Gait belt;Oxygen Activity Tolerance: Patient limited by fatigue Patient left: in chair;with call bell/phone within reach;with chair alarm set Nurse Communication: Mobility status PT Visit Diagnosis: Muscle weakness (generalized) (M62.81)     Time: 1610-9604 PT Time Calculation (min) (ACUTE ONLY): 19 min  Charges:  $Gait Training: 8-22 mins                     Erasmo Leventhal , PTA Acute Rehabilitation Services Pager [phone] Office [phone]    Arlee Bossard Eli Hose 12/21/2021, 2:33 PM

## 2021-12-21 NOTE — Progress Notes (Signed)
HD#9 Subjective:  Overnight Events: NAEO   Patient is not sure she wants to go to SNF. With the holiday coming up she doesn't feel like she would get any therapy and would just be stuck there. She is going to work on getting stronger and using her spirometer today.  Objective:  Vital signs in last 24 hours: Vitals:   12/20/21 2052 12/21/21 0455 12/21/21 0743 12/21/21 1236  BP: 109/75 120/66 114/81 121/72  Pulse: 61 (!) 56 (!) 51 85  Resp: 18 18 17 18   Temp: 98.4 F (36.9 C) 98.2 F (36.8 C) 97.7 F (36.5 C) 97.8 F (36.6 C)  TempSrc: Oral Oral Oral Oral  SpO2: 99% 97% 97% 98%  Weight:  130.9 kg    Height:       Supplemental O2: Nasal Cannula SpO2: 98 % O2 Flow Rate (L/min): 3.5 L/min   Physical Exam:  Constitutional: obese elderly woman resting comfortably in bed, in no acute distress   Cardiovascular:irregularly irregular rhythm, 1+ pitting edema of the BLE, dependent edema of the upper thighs Pulmonary/Chest: normal WOB on 3.5L [place], Bibasilar crackles Abdominal: soft, non-tender, distended MSK: normal bulk and tone Neurological: alert & oriented x 3, mild resting tremor of the left hand Skin: warm and dry Psych: normal affect  Filed Weights   12/19/21 0500 12/20/21 0440 12/21/21 0455  Weight: 131.5 kg 132.3 kg 130.9 kg     Intake/Output Summary (Last 24 hours) at 12/21/2021 1359 Last data filed at 12/21/2021 1100 Gross per 24 hour  Intake 300 ml  Output 200 ml  Net 100 ml   Net IO Since Admission: 854.92 mL [12/21/21 1359]  Pertinent Labs: CBC Latest Ref Rng & Units 12/21/2021 12/20/2021 12/19/2021  WBC 4.0 - 10.5 K/uL 13.6(H) 15.7(H) 15.6(H)  Hemoglobin 12.0 - 15.0 g/dL 12.4 12.4 12.4  Hematocrit 36.0 - 46.0 % 38.5 37.7 37.3  Platelets 150 - 400 K/uL 541(H) 557(H) 527(H)    CMP Latest Ref Rng & Units 12/21/2021 12/20/2021 12/19/2021  Glucose 70 - 99 mg/dL 100(H) 88 108(H)  BUN 8 - 23 mg/dL 37(H) 40(H) 41(H)  Creatinine 0.44 - 1.00 mg/dL 1.55(H)  1.73(H) 1.59(H)  Sodium 135 - 145 mmol/L 138 138 139  Potassium 3.5 - 5.1 mmol/L 3.7 3.4(L) 3.5  Chloride 98 - 111 mmol/L 106 105 103  CO2 22 - 32 mmol/L 26 26 27   Calcium 8.9 - 10.3 mg/dL 8.5(L) 8.5(L) 8.6(L)  Total Protein 6.5 - 8.1 g/dL - - -  Total Bilirubin 0.3 - 1.2 mg/dL - - -  Alkaline Phos 38 - 126 U/L - - -  AST 15 - 41 U/L - - -  ALT 0 - 44 U/L - - -    Imaging: No results found.  Assessment/Plan:   Principal Problem:   Urinary tract infection Active Problems:   Diabetes mellitus ([place])   CKD stage G3b/A1, GFR 30-44 and albumin creatinine ratio <30 mg/g (HCC)   Acute kidney injury superimposed on chronic kidney disease (HCC)   At risk for sepsis   Acute congestive heart failure ([place])   Patient Summary: Katelyn Mahoney is a 87 y.o. with pertinent PMH of DMII, HTN, obesity, CKD, and chronic lower extremity edema is presenting with a 1 week of dyspnea with associated cough admitted for Gram negative rod bacteremia now finished with antibiotics   #Proteus bacteremia due to UTI; resolved #Sepsis; resolved Patient's blood cultures grew proteus species, sensitive to cefazolin. WBC has decreased to 13. She has finished  with her course of antibiotics. PT/OT are recommending to SNF, though patient wants to go home. Will talk with patient's family today to get some more information about how much support she has at home. Will encourage activity/movement to the chair as patient is at risk for deconditioning. She is still symptomatic with activity and has significant desaturations with exertion despite being on 4 L. She is not yet ready to function at home so we will continue to watch her respiratory status over the next several days. - daily CBC - wean oxygen as able - reaching out to family today - encourage ambulation/mobilization   #Atrial fibrillation #Grade II diastolic Dysfunction; elevated pulmonary artery pressures Patient short of breath with LEE and a history of  lasix on home medications though no clear documentation for why she takes this medication. Echo showed EF of 50-55% with low normal function and elevated PAP of 49.1. Will restart home dose of lasix today at this time and see if this helps her shortness of breath - CHADsVASC 6, HASBLED 2 - eliquis 2.5 BID - incentive spirometry - albuterol as needed for wheezing - wean oxygen as able - telemetry - lasix 40 daily - continue metoprolol 100 daily   Acute on Chronic Kidney Disease stage 3b Creatinine has been running between 1.5-1.7 for the last several days. Will continue to encourage PO intake - Avoiding nephrotoxic agents.  - daily bmp   #DMII Last A1c in November was 7.4. Takes insulin and ozempic at home. Back on home dose of long acting insulin and sugars have improved - semglee 35 units and novolog 6 TID with meals - SSI - holding gabapentin   #HTN - Continue home amlodipine 5   #HLD - continue home lipitor 20 daily   #Depression - Continue sertraline 100 mg daily   #B12 Deficiency  Low at 158. Will continue oral supplementation   #Hypocalcemia; resolved   Diet: Heart Healthy VTE: Eliquis Code: DNR/DNI  [name], MD Internal Medicine Resident PGY-1 Pager [phone] Please contact the on call pager after 5 pm and on weekends at [phone].

## 2021-12-22 LAB — GLUCOSE, CAPILLARY
Glucose-Capillary: 116 mg/dL — ABNORMAL HIGH (ref 70–99)
Glucose-Capillary: 126 mg/dL — ABNORMAL HIGH (ref 70–99)
Glucose-Capillary: 136 mg/dL — ABNORMAL HIGH (ref 70–99)
Glucose-Capillary: 137 mg/dL — ABNORMAL HIGH (ref 70–99)
Glucose-Capillary: 147 mg/dL — ABNORMAL HIGH (ref 70–99)
Glucose-Capillary: 169 mg/dL — ABNORMAL HIGH (ref 70–99)

## 2021-12-22 NOTE — Plan of Care (Signed)

## 2021-12-22 NOTE — Progress Notes (Signed)
Mobility Specialist Progress Note:   12/22/21 1000  Mobility  Activity Ambulated in room  Level of Assistance Standby assist, set-up cues, supervision of patient - no hands on  Assistive Device Front wheel walker  Distance Ambulated (ft) 80 ft  Mobility Ambulated with assistance in room  Mobility Response Tolerated fair  Mobility performed by Mobility specialist  Bed Position Chair  $Mobility charge 1 Mobility   Session performed on 4LO2. Pt not requiring any physical assist throughout session. Required x1 seated rest break d/t fatigue/SOB, SpO2 dropping to 89%. Pt back in chair with all needs met.   Nelta Numbers Mobility Specialist  Phone [phone]

## 2021-12-22 NOTE — Progress Notes (Signed)
MD on call informed that CBG's are schedule q4 but insulin is scheduled [facility] Arthor Captain LPN

## 2021-12-22 NOTE — Care Management Important Message (Signed)
Important Message  Patient Details  Name: Katelyn Mahoney MRN: [id number] Date of Birth: Feb 12, 1934   Medicare Important Message Given:  Yes     Hannah Beat 12/22/2021, 12:56 PM

## 2021-12-22 NOTE — Progress Notes (Signed)
HD#10 SUBJECTIVE:  Patient Summary: Katelyn Mahoney is a 87 y.o. with a pertinent PMH of DMII, HTN, obesity, CKD, and chronic lower extremity edema who presented with a 1 week of dyspnea with associated cough admitted for Gram negative rod bacteremia now finished with antibiotics.  Overnight Events: No acute events overnight  Interim History: This is hospital day 10 for Katelyn Mahoney who was seen and evaluated at the bedside this morning. Patient feels like her breathing is about the same as it was yesterday, no significant improvement. Denies fevers, chills, or cough. Still requiring 3.5-4 L [place].   OBJECTIVE:  Vital Signs: Vitals:   12/21/21 1236 12/21/21 2021 12/22/21 0447 12/22/21 0449  BP: 121/72 104/64 111/65   Pulse: 85 60 (!) 58   Resp: 18 18 18    Temp: 97.8 F (36.6 C) 97.9 F (36.6 C) 98.2 F (36.8 C)   TempSrc: Oral Oral Oral   SpO2: 98% 98% 97%   Weight:    130.2 kg  Height:       Supplemental O2: Nasal Cannula SpO2: 97 % O2 Flow Rate (L/min): 3.5 L/min  Filed Weights   12/20/21 0440 12/21/21 0455 12/22/21 0449  Weight: 132.3 kg 130.9 kg 130.2 kg     Intake/Output Summary (Last 24 hours) at 12/22/2021 0641 Last data filed at 12/22/2021 0400 Gross per 24 hour  Intake 540 ml  Output 500 ml  Net 40 ml   Net IO Since Admission: 794.92 mL [12/22/21 0641]  Physical Exam: General: Obese, elderly woman laying in bed. No acute distress. CV: Irregularly irregular. No murmurs, rubs, or gallops. 1+ pitting edema bilaterally extending to thighs Pulmonary: Bibasilar crackles at lung bases. Increased effort of breathing on 3.5 L [place], shortness of breath while conversing. Abdominal: Soft, nontender, nondistended. Normal bowel sounds. Extremities: Palpable radial and DP pulses.  Skin: Warm and dry.  Neuro: A&Ox3. No focal deficit. Psych: Normal mood and affect    ASSESSMENT/PLAN:  Assessment: Principal Problem:   Urinary tract infection Active Problems:    Diabetes mellitus (HCC)   CKD stage G3b/A1, GFR 30-44 and albumin creatinine ratio <30 mg/g (HCC)   Acute kidney injury superimposed on chronic kidney disease (HCC)   At risk for sepsis   Acute congestive heart failure (HCC)   Plan: #Proteus bacteremia due to UTI; resolved #Sepsis 2/2 above; resolved Blood cultures positive for proteus, sensitive for cefazolin s/p completed course of abx. Patient is recommended to go to SNF, however, she would like to go home with home health PT/OT. Patient continues to deny dysuria, hematuria.  - Encourage ambulation and mobilization  - Continue to monitor CBC  #Afib #Grade II diastolic dysfunction  #Deconditioning and hypoxia Patient with increased shortness of breath, requiring 3.5-4 L [place]. EF 50-55% and elevated PAP. Restarted on home lasix yesterday in the setting of bibasilar crackles at lung bases. CHADsVASC score of 6, HASBLED 2. Likely will require supplemental O2 on discharge, however, goal to wean O2 requirements prior to discharge.  - Eliquis 2.5 mg bid - Lasix 40 mg daily - Metoprolol 100 mg daily - Continue incentive spirometry  - Albuterol prn  - Wean O2 as able  #Acute on chronic kidney disease stage 3b Cr around 1.5-1.7 - Continue to monitor BMP - Avoid nephrotoxins  #T2DM A1c 7.4. On insulin and ozempic at home. CBGs well controlled over the last 24 hrs.  - Continue semglee 35u and novolog 6 tid - SSI  #Hypertension - Home amlodipine 5 mg  #HLD -  Home lipitor 20 mg daily  #Depression - Continue sertraline 100 mg daily   #B12 Deficiency  Low at 158.  - Will continue oral supplementation  Best Practice: Diet: Cardiac diet IVF: Fluids: none VTE: apixaban (ELIQUIS) tablet 2.5 mg Start: 12/15/21 1145 Code: DNR AB: None Therapy Recs: SNF, DME: none DISPO: Anticipated discharge to Home pending  clinical improvement and O2 requirements .  Signature: Buddy Duty, D.O.  Internal Medicine Resident, PGY-1 [facility] Res[facility]  Pager: [phone] 6:41 AM, 12/22/2021   Please contact the on call pager after 5 pm and on weekends at [phone].

## 2021-12-22 NOTE — Progress Notes (Addendum)
Occupational Therapy Treatment Patient Details Name: Katelyn Mahoney MRN: [id number] DOB: [birth year] Today's Date: 12/22/2021   History of present illness Pt is 87 y/o F presenting with fever and acute hypoxic respiratory failure, found to have UTI. PMH includes DM II, HTN, obesity, CKD, HLD, chronic LE edema.   OT comments  Pt making progress with functional goals. Session focused on functional mobility using RW to walk to [facility] for toileting, grooming/hygiene, LB ADLs seated and ADL mobility safety. Pt with no SOB with VSS stable. OT will continue to follow acutely to maximize level of function and safety   Recommendations for follow up therapy are one component of a multi-disciplinary discharge planning process, led by the attending physician.  Recommendations may be updated based on patient status, additional functional criteria and insurance authorization.    Follow Up Recommendations  SNF    Assistance Recommended at Discharge Frequent or constant Supervision/Assistance  Equipment Recommendations  BSC/3in1    Recommendations for Other Services      Precautions / Restrictions Precautions Precautions: Fall;Other (comment) Precaution Comments: Monitor vitals- O2 Restrictions Weight Bearing Restrictions: No       Mobility Bed Mobility               General bed mobility comments: Pt seated in recliner upon arrival    Transfers Overall transfer level: Needs assistance Equipment used: Rolling walker (2 wheels) Transfers: Sit to/from Stand Sit to Stand: Min guard;Supervision           General transfer comment: Cues for hand placement to and from seated surface.     Balance Overall balance assessment: Needs assistance Sitting-balance support: Feet supported Sitting balance-Leahy Scale: Fair     Standing balance support: Bilateral upper extremity supported;During functional activity Standing balance-Leahy Scale: Poor                              ADL either performed or assessed with clinical judgement   ADL Overall ADL's : Needs assistance/impaired     Grooming: Supervision/safety;Standing;Wash/dry face;Wash/dry hands       Lower Body Bathing: Moderate assistance;Sitting/lateral leans;Sit to/from stand Lower Body Bathing Details (indicate cue type and reason): simulated     Lower Body Dressing: Moderate assistance;Sitting/lateral leans;Sit to/from stand   Toilet Transfer: Min guard;Supervision/safety;Rolling walker (2 wheels);Ambulation;BSC/3in1   Toileting- Water quality scientist and Hygiene: Min guard;Sit to/from stand       Functional mobility during ADLs: Min guard;Supervision/safety;Rolling walker (2 wheels)      Extremity/Trunk Assessment Upper Extremity Assessment Upper Extremity Assessment: Generalized weakness   Lower Extremity Assessment Lower Extremity Assessment: Defer to PT evaluation   Cervical / Trunk Assessment Cervical / Trunk Assessment: Kyphotic    Vision Baseline Vision/History: 1 Wears glasses Ability to See in Adequate Light: 0 Adequate Patient Visual Report: No change from baseline     Perception     Praxis      Cognition Arousal/Alertness: Awake/alert Behavior During Therapy: WFL for tasks assessed/performed Overall Cognitive Status: Within Functional Limits for tasks assessed                                            Exercises     Shoulder Instructions       General Comments      Pertinent Vitals/ Pain       Pain Assessment: No/denies pain  Pain Score: 0-No pain Faces Pain Scale: No hurt Pain Intervention(s): Monitored during session;Repositioned  Home Living Family/patient expects to be discharged to:: Private residence                                        Prior Functioning/Environment              Frequency  Min 2X/week        Progress Toward Goals  OT Goals(current goals can now be found in the care plan  section)  Progress towards OT goals: Progressing toward goals     Plan Discharge plan remains appropriate;Frequency remains appropriate    Co-evaluation                 AM-PAC OT "6 Clicks" Daily Activity     Outcome Measure   Help from another person eating meals?: None Help from another person taking care of personal grooming?: None Help from another person toileting, which includes using toliet, bedpan, or urinal?: A Little Help from another person bathing (including washing, rinsing, drying)?: A Lot Help from another person to put on and taking off regular upper body clothing?: A Little Help from another person to put on and taking off regular lower body clothing?: A Lot 6 Click Score: 18    End of Session Equipment Utilized During Treatment: Rolling walker (2 wheels);Oxygen;Other (comment) (BSC)  OT Visit Diagnosis: Unsteadiness on feet (R26.81);Muscle weakness (generalized) (M62.81)   Activity Tolerance Patient tolerated treatment well;Patient limited by fatigue   Patient Left in chair;with call bell/phone within reach;with chair alarm set   Nurse Communication          Time: [phone] OT Time Calculation (min): 21 min  Charges: OT General Charges $OT Visit: 1 Visit OT Treatments $Self Care/Home Management : 8-22 mins    Britt Bottom 12/22/2021, 1:25 PM

## 2021-12-22 NOTE — Progress Notes (Signed)
Mobility Specialist Progress Note:   12/22/21 1200  Mobility  Activity Ambulated in room  Level of Assistance Standby assist, set-up cues, supervision of patient - no hands on  Assistive Device Front wheel walker  Distance Ambulated (ft) 80 ft  Mobility Ambulated with assistance in room  Mobility Response Tolerated fair  Mobility performed by Mobility specialist  Bed Position Chair  $Mobility charge 1 Mobility   Second session today. Pt with SOB during ambulation. SpO2 dropping to 88% on 3LO2. Pt otherwise asx, back in chair with chair alarm on.   Nelta Numbers Mobility Specialist  Phone [phone]

## 2021-12-23 DIAGNOSIS — R6510 Systemic inflammatory response syndrome (SIRS) of non-infectious origin without acute organ dysfunction: Secondary | ICD-10-CM

## 2021-12-23 DIAGNOSIS — J9601 Acute respiratory failure with hypoxia: Secondary | ICD-10-CM

## 2021-12-23 DIAGNOSIS — R059 Cough, unspecified: Secondary | ICD-10-CM

## 2021-12-23 LAB — GLUCOSE, CAPILLARY
Glucose-Capillary: 137 mg/dL — ABNORMAL HIGH (ref 70–99)
Glucose-Capillary: 148 mg/dL — ABNORMAL HIGH (ref 70–99)
Glucose-Capillary: 149 mg/dL — ABNORMAL HIGH (ref 70–99)
Glucose-Capillary: 169 mg/dL — ABNORMAL HIGH (ref 70–99)

## 2021-12-23 LAB — BASIC METABOLIC PANEL
Anion gap: 7 (ref 5–15)
BUN: 42 mg/dL — ABNORMAL HIGH (ref 8–23)
CO2: 26 mmol/L (ref 22–32)
Calcium: 8.4 mg/dL — ABNORMAL LOW (ref 8.9–10.3)
Chloride: 103 mmol/L (ref 98–111)
Creatinine, Ser: 1.71 mg/dL — ABNORMAL HIGH (ref 0.44–1.00)
GFR, Estimated: 29 mL/min — ABNORMAL LOW (ref >60)
Glucose, Bld: 203 mg/dL — ABNORMAL HIGH (ref 70–99)
Potassium: 4.1 mmol/L (ref 3.5–5.1)
Sodium: 136 mmol/L (ref 135–145)

## 2021-12-23 LAB — CBC
HCT: 34.8 % — ABNORMAL LOW (ref 36.0–46.0)
Hemoglobin: 11.1 g/dL — ABNORMAL LOW (ref 12.0–15.0)
MCH: 33.1 pg (ref 26.0–34.0)
MCHC: 31.9 g/dL (ref 30.0–36.0)
MCV: 103.9 fL — ABNORMAL HIGH (ref 80.0–100.0)
Platelets: 491 10*3/uL — ABNORMAL HIGH (ref 150–400)
RBC: 3.35 MIL/uL — ABNORMAL LOW (ref 3.87–5.11)
RDW: 14.7 % (ref 11.5–15.5)
WBC: 13.6 10*3/uL — ABNORMAL HIGH (ref 4.0–10.5)
nRBC: 0.5 % — ABNORMAL HIGH (ref 0.0–0.2)

## 2021-12-23 MED ORDER — FUROSEMIDE 40 MG PO TABS
40.0000 mg | ORAL_TABLET | Freq: Once | ORAL | Status: AC
  Administered 2021-12-23: 11:00:00 40 mg via ORAL
  Filled 2021-12-23: qty 1

## 2021-12-23 NOTE — Progress Notes (Signed)
HD#11 SUBJECTIVE:  Patient Summary: Katelyn Mahoney is a 87 y.o. with a pertinent PMH of DMII, HTN, obesity, CKD, and chronic lower extremity edema who presented with a 1 week of dyspnea with associated cough admitted for Gram negative rod bacteremia now finished with antibiotics  Overnight Events: No acute events overnight  Interim History: This is hospital day 11 for this patient who was seen and evaluated at the bedside this morning. She states that her breathing is not as good as she would like it to be. She still experiences dyspnea on exertion, especially when getting from her bed to her bedside chair. She has tried to ambulate some, but this has been limited by her shortness of breath.   OBJECTIVE:  Vital Signs: Vitals:   12/22/21 0837 12/22/21 1532 12/22/21 2101 12/23/21 0516  BP: 126/66 107/62 (!) 145/76 102/60  Pulse: (!) 56 93 (!) 108 67  Resp: 17 17 20 19   Temp: 97.6 F (36.4 C) 98.2 F (36.8 C) 97.9 F (36.6 C) (!) 97.5 F (36.4 C)  TempSrc: Oral Oral Oral Oral  SpO2: 98% 97% 97% 95%  Weight:      Height:       Supplemental O2: Nasal Cannula SpO2: 95 % O2 Flow Rate (L/min): 3 L/min  Filed Weights   12/20/21 0440 12/21/21 0455 12/22/21 0449  Weight: 132.3 kg 130.9 kg 130.2 kg     Intake/Output Summary (Last 24 hours) at 12/23/2021 3154 Last data filed at 12/22/2021 2046 Gross per 24 hour  Intake 240 ml  Output 600 ml  Net -360 ml   Net IO Since Admission: 434.92 mL [12/23/21 0624]  Physical Exam: General: Obese female sitting in bedside chair. No acute distress. CV: Irregularly irregular. No murmurs, rubs, or gallops. 1+ bilateral pitting edema Pulmonary: Bibasilar crackles at lung bases worse on left. Increased work of breathing on 3 L [place] while conversing. Abdominal: Soft, nontender, nondistended.  Extremities: Palpable radial and DP pulses.  Skin: Warm and dry.  Neuro: A&Ox3. No focal deficit. Psych: Flat affect     ASSESSMENT/PLAN:   Assessment: Principal Problem:   Urinary tract infection Active Problems:   Diabetes mellitus (HCC)   CKD stage G3b/A1, GFR 30-44 and albumin creatinine ratio <30 mg/g (HCC)   Acute kidney injury superimposed on chronic kidney disease (HCC)   At risk for sepsis   Acute congestive heart failure ([place])   Plan: #Proteus bacteremia due to UTI; resolved #Sepsis 2/2 above; resolved Blood cultures positive for proteus, sensitive for cefazolin s/p completed course of abx on 12/20. Patient is recommended to go to SNF, however, she would like to go home with home health PT/OT.  - Encourage ambulation and mobilization  - Continue to monitor CBC   #Afib #Acute on chronic HFpEF #Pulmonary edema Patient with increased shortness of breath and orthopnea, requiring 3 L [place]. EF 50-55% and elevated PAP. I&Os with only 800 cc urine output, weight not significantly changed. Likely will require supplemental O2 on discharge, however, goal to wean O2 requirements prior to discharge.  - Eliquis 2.5 mg bid - Lasix 40 mg daily, will give an extra po dose today  - Metoprolol 100 mg daily - Continue incentive spirometry  - Albuterol prn  - Wean O2 as able   #Acute on chronic kidney disease stage 3b Cr around 1.7 this morning, GFR 29. Cr has been between 1.5-1.7.  - Continue to monitor BMP - Avoid nephrotoxins   #T2DM A1c 7.4. On insulin and ozempic at  home.  - Continue semglee 35u and novolog 6 tid - SSI  Best Practice: Diet: Cardiac diet IVF: Fluids: none VTE: apixaban (ELIQUIS) tablet 2.5 mg Start: 12/15/21 1145 Code: DNR AB: None Therapy Recs: SNF, DME: none DISPO: Anticipated discharge  1-3 days  to Home pending Medical stability and New oxygen.  Signature: Buddy Duty, D.O.  Internal Medicine Resident, PGY-1 [facility]  Pager: [phone] 6:24 AM, 12/23/2021   Please contact the on call pager after 5 pm and on weekends at [phone].

## 2021-12-23 NOTE — Progress Notes (Addendum)
SATURATION QUALIFICATIONS: (This note is used to comply with regulatory documentation for home oxygen)  Patient Saturations on Room Air at Rest = 94%  Patient Saturations on Room Air while Ambulating = 87%  Patient Saturations on 3 Liters of oxygen while Ambulating = 92%  Please briefly explain why patient needs home oxygen:Patient unable to maintain adequate oxygenation while ambulating on room air

## 2021-12-24 LAB — BASIC METABOLIC PANEL
Anion gap: 6 (ref 5–15)
BUN: 39 mg/dL — ABNORMAL HIGH (ref 8–23)
CO2: 31 mmol/L (ref 22–32)
Calcium: 8.7 mg/dL — ABNORMAL LOW (ref 8.9–10.3)
Chloride: 101 mmol/L (ref 98–111)
Creatinine, Ser: 1.52 mg/dL — ABNORMAL HIGH (ref 0.44–1.00)
GFR, Estimated: 33 mL/min — ABNORMAL LOW (ref >60)
Glucose, Bld: 176 mg/dL — ABNORMAL HIGH (ref 70–99)
Potassium: 4.3 mmol/L (ref 3.5–5.1)
Sodium: 138 mmol/L (ref 135–145)

## 2021-12-24 LAB — CBC
HCT: 37.8 % (ref 36.0–46.0)
Hemoglobin: 12.1 g/dL (ref 12.0–15.0)
MCH: 33.3 pg (ref 26.0–34.0)
MCHC: 32 g/dL (ref 30.0–36.0)
MCV: 104.1 fL — ABNORMAL HIGH (ref 80.0–100.0)
Platelets: 526 10*3/uL — ABNORMAL HIGH (ref 150–400)
RBC: 3.63 MIL/uL — ABNORMAL LOW (ref 3.87–5.11)
RDW: 14.9 % (ref 11.5–15.5)
WBC: 12.6 10*3/uL — ABNORMAL HIGH (ref 4.0–10.5)
nRBC: 0.6 % — ABNORMAL HIGH (ref 0.0–0.2)

## 2021-12-24 LAB — GLUCOSE, CAPILLARY
Glucose-Capillary: 146 mg/dL — ABNORMAL HIGH (ref 70–99)
Glucose-Capillary: 148 mg/dL — ABNORMAL HIGH (ref 70–99)
Glucose-Capillary: 223 mg/dL — ABNORMAL HIGH (ref 70–99)
Glucose-Capillary: 205 mg/dL — ABNORMAL HIGH (ref 70–99)

## 2021-12-24 MED ORDER — PANTOPRAZOLE SODIUM 40 MG PO TBEC
40.0000 mg | DELAYED_RELEASE_TABLET | Freq: Every day | ORAL | Status: DC
  Administered 2021-12-24 – 2021-12-27 (×4): 40 mg via ORAL
  Filled 2021-12-24 (×4): qty 1

## 2021-12-24 MED ORDER — METOPROLOL TARTRATE 25 MG PO TABS: 25.0000 mg | ORAL_TABLET | Freq: Two times a day (BID) | ORAL | Status: DC

## 2021-12-24 NOTE — Progress Notes (Signed)
HD#12 SUBJECTIVE:  Patient Summary: Katelyn Mahoney is a 87 y.o. with a pertinent PMH of DMII, HTN, obesity, CKD, and chronic lower extremity edema who presented with a 1 week of dyspnea with associated cough admitted for Gram negative rod bacteremia now finished with antibiotics  Overnight Events: No acute events overnight  Interim History: This is hospital day 12 for this patient who was seen and evaluated at the bedside this morning. She reports that her breathing is improved since receiving the extra dose of diuretics. She does endorse having an episode of a dark, loose stool yesterday. No abd pain.  OBJECTIVE:  Vital Signs: Vitals:   12/23/21 2057 12/24/21 0634 12/24/21 0707 12/24/21 0844  BP: 126/86 100/78 (!) 96/57 101/74  Pulse: 63 (!) 56 (!) 52 (!) 49  Resp: 20 19  18   Temp: 98.1 F (36.7 C) 97.6 F (36.4 C)  97.8 F (36.6 C)  TempSrc: Oral Axillary  Oral  SpO2: 93% 96%  99%  Weight:      Height:       Supplemental O2: Nasal Cannula SpO2: 99 % O2 Flow Rate (L/min): 3 L/min  Filed Weights   12/20/21 0440 12/21/21 0455 12/22/21 0449  Weight: 132.3 kg 130.9 kg 130.2 kg     Intake/Output Summary (Last 24 hours) at 12/24/2021 1059 Last data filed at 12/24/2021 0641 Gross per 24 hour  Intake 550 ml  Output 1300 ml  Net -750 ml   Net IO Since Admission: -475.08 mL [12/24/21 1059]  Physical Exam: General: Obese female sitting in bedside chair. No acute distress. CV: Irregularly irregular. No murmurs, rubs, or gallops. 1+ bilateral pitting edema Pulmonary: Bibasilar crackles at lung bases worse on left. Normal work of breathing on 3 L [place].  Abdominal: Soft, nontender, nondistended. +BS Extremities: Palpable radial and DP pulses.  Skin: Warm and dry.  Neuro: A&Ox3. No focal deficit. Psych: Flat affect     ASSESSMENT/PLAN:  Assessment: Principal Problem:   Urinary tract infection Active Problems:   Diabetes mellitus (HCC)   CKD stage G3b/A1, GFR 30-44  and albumin creatinine ratio <30 mg/g (HCC)   Acute kidney injury superimposed on chronic kidney disease (HCC)   At risk for sepsis   Acute congestive heart failure ([place])   Plan: #Proteus bacteremia due to UTI; resolved #Sepsis 2/2 above; resolved Blood cultures positive for proteus, sensitive for cefazolin s/p completed course of abx on 12/20. Patient is recommended to go to SNF, however, she would like to go home with home health PT/OT.  - Encourage ambulation and mobilization  - Continue to monitor CBC   #Afib #Acute on chronic HFpEF #Pulmonary edema Patient with increased shortness of breath and orthopnea, requiring 3 L [place], although remains stable today. EF 50-55% and elevated PAP. I&Os with 1.5 L urine output in last 24 hrs, although weight not significantly changed. Will require supplemental O2 on discharge, however, goal to wean O2 requirements prior to discharge.  - Eliquis 2.5 mg bid - Lasix 40 mg daily - Metoprolol 100 mg daily - Continue incentive spirometry  - Albuterol prn  - Wean O2 as able  #Dark bowel movement Patient endorses and episode of a dark, loose bowel movement. Denies any other signs of active bleeding. - Check CBC - Will continue eliquis    #Acute on chronic kidney disease stage 3b Cr around 1.7 this morning, GFR 29. Cr has been between 1.5-1.7.  - Continue to monitor BMP - Avoid nephrotoxins   #T2DM A1c 7.4. On  insulin and ozempic at home.  - Continue semglee 35u and novolog 6 tid - SSI  Best Practice: Diet: Cardiac diet IVF: Fluids: none VTE: apixaban (ELIQUIS) tablet 2.5 mg Start: 12/15/21 1145 Code: DNR AB: None Therapy Recs: SNF, DME: none DISPO: Anticipated discharge  1-2 days  to Home pending Medical stability and New oxygen.  Signature: Buddy Duty, D.O.  Internal Medicine Resident, PGY-1 [facility]  Pager: [phone] 10:59 AM, 12/24/2021   Please contact the on call pager after 5 pm and on  weekends at [phone].

## 2021-12-25 LAB — CBC
HCT: 35.8 % — ABNORMAL LOW (ref 36.0–46.0)
Hemoglobin: 11.6 g/dL — ABNORMAL LOW (ref 12.0–15.0)
MCH: 33.9 pg (ref 26.0–34.0)
MCHC: 32.4 g/dL (ref 30.0–36.0)
MCV: 104.7 fL — ABNORMAL HIGH (ref 80.0–100.0)
Platelets: 424 10*3/uL — ABNORMAL HIGH (ref 150–400)
RBC: 3.42 MIL/uL — ABNORMAL LOW (ref 3.87–5.11)
RDW: 15 % (ref 11.5–15.5)
WBC: 15.6 10*3/uL — ABNORMAL HIGH (ref 4.0–10.5)
nRBC: 0.4 % — ABNORMAL HIGH (ref 0.0–0.2)

## 2021-12-25 LAB — HEPATIC FUNCTION PANEL
ALT: 9 U/L (ref 0–44)
AST: 20 U/L (ref 15–41)
Albumin: 2.4 g/dL — ABNORMAL LOW (ref 3.5–5.0)
Alkaline Phosphatase: 71 U/L (ref 38–126)
Bilirubin, Direct: 0.1 mg/dL (ref 0.0–0.2)
Indirect Bilirubin: 0.5 mg/dL (ref 0.3–0.9)
Total Bilirubin: 0.6 mg/dL (ref 0.3–1.2)
Total Protein: 5.7 g/dL — ABNORMAL LOW (ref 6.5–8.1)

## 2021-12-25 LAB — BASIC METABOLIC PANEL
Anion gap: 10 (ref 5–15)
BUN: 40 mg/dL — ABNORMAL HIGH (ref 8–23)
CO2: 25 mmol/L (ref 22–32)
Calcium: 8.6 mg/dL — ABNORMAL LOW (ref 8.9–10.3)
Chloride: 105 mmol/L (ref 98–111)
Creatinine, Ser: 1.54 mg/dL — ABNORMAL HIGH (ref 0.44–1.00)
GFR, Estimated: 33 mL/min — ABNORMAL LOW (ref >60)
Glucose, Bld: 79 mg/dL (ref 70–99)
Potassium: 4.5 mmol/L (ref 3.5–5.1)
Sodium: 140 mmol/L (ref 135–145)

## 2021-12-25 LAB — GLUCOSE, CAPILLARY
Glucose-Capillary: 167 mg/dL — ABNORMAL HIGH (ref 70–99)
Glucose-Capillary: 122 mg/dL — ABNORMAL HIGH (ref 70–99)
Glucose-Capillary: 93 mg/dL (ref 70–99)
Glucose-Capillary: 174 mg/dL — ABNORMAL HIGH (ref 70–99)

## 2021-12-25 MED ORDER — LACTATED RINGERS IV BOLUS
500.0000 mL | Freq: Once | INTRAVENOUS | Status: AC
  Administered 2021-12-25: 10:00:00 500 mL via INTRAVENOUS

## 2021-12-25 MED ORDER — METOPROLOL TARTRATE 25 MG PO TABS
25.0000 mg | ORAL_TABLET | Freq: Two times a day (BID) | ORAL | Status: DC
  Administered 2021-12-26 – 2021-12-27 (×3): 25 mg via ORAL
  Filled 2021-12-25 (×3): qty 1

## 2021-12-25 NOTE — Consult Note (Signed)
CARDIOLOGY CONSULT NOTE  Patient ID: Katelyn Mahoney MRN: [id number] DOB/AGE: [birth year] 87 y.o.  Admit date: 12/12/2021 Referring Physician: Internal Medicine teaching service Reason for Consultation:  Afib  HPI:   87 y.o. Caucasian female  with hypertension, type 2 diabetes mellitus, obesity, admitted on 12/12/2021 with acute hypoxic respiratory failure, UTI.  Subsequently, patient was diagnosed and treated with possible pneumonia with antibiotics.  She is continue to require 2 L nasal cannula oxygen.  During hospitalization, she developed A. fib, which is reportedly difficult to control with metoprolol.  Patient denies any chest pain to me.  She continues to have dyspnea on exertion with minimal activity is walking.  Cardiac telemetry is not available at present.  Ventricular rate is 101 110 bpm on EKGs.  Metoprolol succinate 100 mg was discontinued due to low blood pressure.  She is currently not on any AV nodal blocking agents.  Metoprolol tartrate has not been started yet.  Patient does report leg swelling, but it has improved.  She was initially given IV Lasix during this hospitalization, but is currently not on any diuretics.  Past Medical History:  Diagnosis Date   Allergy    Anxiety    Arthritis    Arthritis    Callus    Depression    Diabetes mellitus without complication ([place])    Hyperlipidemia    Hypertension      Past Surgical History:  Procedure Laterality Date   ABDOMINAL HYSTERECTOMY     CHOLECYSTECTOMY     SPLENECTOMY        Family History  Problem Relation Age of Onset   Cancer Mother    Diabetes Mother    Cancer Father    Diabetes Father    Cancer Sister    Diabetes Sister    Cancer Brother    Brain cancer Brother    Diabetes Brother    Diabetes Maternal Grandmother    Diabetes Paternal Grandmother    Cancer - Lung Brother    Diabetes Brother      Social History: Social History   Socioeconomic History   Marital status: Widowed     Spouse name: Not on file   Number of children: Not on file   Years of education: Not on file   Highest education level: Not on file  Occupational History   Not on file  Tobacco Use   Smoking status: Never   Smokeless tobacco: Never  Substance and Sexual Activity   Alcohol use: Mahoney    Alcohol/week: 0.0 standard drinks   Drug use: Mahoney   Sexual activity: Never    Birth control/protection: Abstinence  Other Topics Concern   Not on file  Social History Narrative   Not on file   Social Determinants of Health   Financial Resource Strain: Low Risk    Difficulty of Paying Living Expenses: Not hard at all  Food Insecurity: Mahoney Food Insecurity   Worried About Charity fundraiser in the Last Year: Never true   [place] in the Last Year: Never true  Transportation Needs: Mahoney Transportation Needs   Lack of Transportation (Medical): Mahoney   Lack of Transportation (Non-Medical): Mahoney  Physical Activity: Inactive   Days of Exercise per Week: 0 days   Minutes of Exercise per Session: 0 min  Stress: Mahoney Stress Concern Present   Feeling of Stress : Not at all  Social Connections: Socially Isolated   Frequency of Communication with Friends and Family: More than three times a  week   Frequency of Social Gatherings with Friends and Family: More than three times a week   Attends Religious Services: Never   Marine scientist or Organizations: Mahoney   Attends Archivist Meetings: Never   Marital Status: Widowed  Human resources officer Violence: Not At Risk   Fear of Current or Ex-Partner: Mahoney   Emotionally Abused: Mahoney   Physically Abused: Mahoney   Sexually Abused: Mahoney     Medications Prior to Admission  Medication Sig Dispense Refill Last Dose   acetaminophen (TYLENOL) 650 MG CR tablet 650 mg every 8 (eight) hours.   12/12/2021   amLODipine (NORVASC) 5 MG tablet TAKE 1 TABLET DAILY (Patient taking differently: Take 5 mg by mouth daily.) 90 tablet 1 12/12/2021   aspirin 81 MG tablet Take 243 mg  by mouth daily.   12/11/2021 at 7 pm   atorvastatin (LIPITOR) 20 MG tablet TAKE 1/2 TABLET ON MONDAY, WEDNESDAY, FRIDAY AND      SUNDAYS (Patient taking differently: Take 10 mg by mouth See admin instructions. TAKE 1/2 TABLET ON MONDAY, WEDNESDAY, FRIDAY AND      SUNDAYS) 26 tablet 1 12/11/2021   Bilberry, Vaccinium myrtillus, (BILBERRY PO) Take 1 tablet by mouth daily.   12/11/2021   fluocinonide cream (LIDEX) 1.54 % Apply 1 application topically daily as needed (breakouts).   11/28/2021   furosemide (LASIX) 40 MG tablet TAKE 1 TABLET DAILY (Patient taking differently: Take 40 mg by mouth daily as needed for fluid.) 90 tablet 1 11/28/2021   gabapentin (NEURONTIN) 100 MG capsule TAKE 1 CAPSULE BY MOUTH 3 TIMES DAILY (Patient taking differently: 100 mg 2 (two) times daily.) 90 capsule 3 12/12/2021   Glucosamine HCl (GLUCOSAMINE PO) Take 1 tablet by mouth daily.   12/12/2021   insulin lispro (HUMALOG KWIKPEN) 100 UNIT/ML KwikPen Inject 7-9 units under the skin three times daily before meals. E11.65 (Patient taking differently: 7-9 Units 3 (three) times daily.) 30 mL 0 12/11/2021   Loratadine 10 MG CAPS Take 10 mg by mouth daily.   12/12/2021   metoprolol succinate (TOPROL-XL) 100 MG 24 hr tablet TAKE 1 TABLET DAILY WITH ORIMMEDIATELY FOLLOWING A    MEAL (Patient taking differently: 100 mg daily.) 90 tablet 1 12/11/2021 at 7 pm   multivitamin-lutein (OCUVITE-LUTEIN) CAPS capsule Take 1 capsule by mouth daily. 90 capsule 3 12/12/2021   OZEMPIC, 1 MG/DOSE, 4 MG/3ML SOPN INJECT 1MG  SUBCUTANEOUSLY  ONCE A WEEK. (Patient taking differently: Inject 1 mg into the skin once a week. Saturdays) 9 mL 1 12/09/2021   sertraline (ZOLOFT) 100 MG tablet TAKE 1 TABLET DAILY (Patient taking differently: Take 100 mg by mouth daily.) 90 tablet 3 12/12/2021   TOUJEO SOLOSTAR 300 UNIT/ML Solostar Pen INJECT 34 UNITS            SUBCUTANEOUSLY DAILY (Patient taking differently: 34 Units daily.) 13.5 mL 1 12/11/2021   Insulin Pen  Needle 32G X 4 MM MISC Use on insulin pens 4 times a day 400 each 2    NONFORMULARY OR COMPOUNDED ITEM Lift chair  #1   Dx osteoarthritis knees 1 each 0    OneTouch Delica Lancets 00Q MISC Use to test blood sugar once daily- E11.65 100 each 3    ONETOUCH VERIO test strip USE TO TEST BLOOD SUGAR    TWICE A DAY 200 strip 3     Review of Systems  Constitutional: Negative for decreased appetite, malaise/fatigue, weight gain and weight loss.  HENT:  Negative for  congestion.   Eyes:  Negative for visual disturbance.  Cardiovascular:  Positive for dyspnea on exertion and leg swelling. Negative for chest pain, palpitations and syncope.  Respiratory:  Negative for cough.   Endocrine: Negative for cold intolerance.  Hematologic/Lymphatic: Does not bruise/bleed easily.  Skin:  Negative for itching and rash.  Musculoskeletal:  Negative for myalgias.  Gastrointestinal:  Negative for abdominal pain, nausea and vomiting.  Genitourinary:  Negative for dysuria.  Neurological:  Negative for dizziness and weakness.  Psychiatric/Behavioral:  The patient is not nervous/anxious.   All other systems reviewed and are negative.    Physical Exam: Physical Exam Vitals and nursing note reviewed.  Constitutional:      General: She is not in acute distress.    Appearance: She is well-developed. She is obese.  HENT:     Head: Normocephalic and atraumatic.  Eyes:     Conjunctiva/sclera: Conjunctivae normal.     Pupils: Pupils are equal, round, and reactive to light.  Neck:     Vascular: Mahoney JVD.  Cardiovascular:     Rate and Rhythm: Normal rate and regular rhythm.     Pulses: Normal pulses and intact distal pulses.     Heart sounds: Mahoney murmur heard. Pulmonary:     Effort: Pulmonary effort is normal.     Breath sounds: Examination of the left-lower field reveals rales. Rales present. Mahoney wheezing.  Abdominal:     General: Bowel sounds are normal.     Palpations: Abdomen is soft.     Tenderness: There is  Mahoney rebound.  Musculoskeletal:        General: Mahoney tenderness. Normal range of motion.     Right lower leg: Edema (1+) present.     Left lower leg: Edema (1+) present.  Lymphadenopathy:     Cervical: Mahoney cervical adenopathy.  Skin:    General: Skin is warm and dry.  Neurological:     Mental Status: She is alert and oriented to person, place, and time.     Cranial Nerves: Mahoney cranial nerve deficit.     Labs:   Lab Results  Component Value Date   WBC 15.6 (H) 12/25/2021   HGB 11.6 (L) 12/25/2021   HCT 35.8 (L) 12/25/2021   MCV 104.7 (H) 12/25/2021   PLT 424 (H) 12/25/2021    Recent Labs  Lab 12/25/21 0114  NA 140  K 4.5  CL 105  CO2 25  BUN 40*  CREATININE 1.54*  CALCIUM 8.6*  PROT 5.7*  BILITOT 0.6  ALKPHOS 71  ALT 9  AST 20  GLUCOSE 79    Lipid Panel     Component Value Date/Time   CHOL 161 09/11/2021 0932   TRIG 130.0 09/11/2021 0932   HDL 66.80 09/11/2021 0932   CHOLHDL 2 09/11/2021 0932   VLDL 26.0 09/11/2021 0932   LDLCALC 68 09/11/2021 0932   LDLCALC 81 09/08/2020 0840    BNP (last 3 results) Recent Labs    12/12/21 1151 12/17/21 0031  BNP 490.1* 717.5*    HEMOGLOBIN A1C Lab Results  Component Value Date   HGBA1C 7.4 (H) 11/13/2021    Cardiac Panel (last 3 results) Mahoney results for input(s): CKTOTAL, CKMB, RELINDX in the last 8760 hours.  Invalid input(s): TROPONINHS  Lab Results  Component Value Date   CKTOTAL 85 03/10/2009   CKMB 1.9 03/10/2009     TSH Mahoney results for input(s): TSH in the last 8760 hours.    Radiology: Mahoney results found.  Scheduled  Meds:  apixaban  2.5 mg Oral BID   aspirin EC  81 mg Oral Daily   insulin aspart  0-15 Units Subcutaneous TID WC   insulin aspart  6 Units Subcutaneous TID WC   insulin glargine-yfgn  35 Units Subcutaneous QHS   melatonin  3 mg Oral QHS   pantoprazole  40 mg Oral Daily   sertraline  100 mg Oral Daily   vitamin B-12  1,000 mcg Oral Daily   Continuous Infusions: PRN  Meds:.acetaminophen **OR** acetaminophen, levalbuterol, lip balm, phenol, polyvinyl alcohol, sodium chloride  CARDIAC STUDIES:  EKG 12/25/2021: Afib 104 bpm Occasional PVCs  Echocardiogram 12/13/2021:  1. Left ventricular ejection fraction, by estimation, is 50 to 55%. The  left ventricle has low normal function. The left ventricle has Mahoney regional  wall motion abnormalities. Left ventricular diastolic parameters are  consistent with Grade II diastolic  dysfunction (pseudonormalization).   2. Right ventricular systolic function is normal. The right ventricular  size is normal. There is moderately elevated pulmonary artery systolic  pressure. The estimated right ventricular systolic pressure is 82.9 mmHg.   3. A small pericardial effusion is present. The pericardial effusion is  circumferential.   4. The mitral valve is degenerative. Moderate mitral valve regurgitation.  Mahoney evidence of mitral stenosis.   5. The aortic valve is tricuspid. There is mild calcification of the  aortic valve. There is mild thickening of the aortic valve. Aortic valve  regurgitation is trivial. Aortic valve sclerosis/calcification is present,  without any evidence of aortic  stenosis.   6. The inferior vena cava is dilated in size with <50% respiratory  variability, suggesting right atrial pressure of 15 mmHg.   Comparison(s): A prior study was performed on 2010. MR has increased from  2010 report.     Assessment & Recommendations:  87 y.o. Caucasian female  with hypertension, type 2 diabetes mellitus, obesity, admitted on 12/12/2021 with acute hypoxic respiratory failure, UTI.  A. Fib: New diagnosis, currently with ventricular rate around 100 bpm at rest. Baseline respiratory status since evaluation. Rate control difficult to assess without cardiac telemetry.  Recommend cardiac telemetry monitor rate control over the next 24-48 hrs. Recommend Metoprolol tartrate 25 mg twice daily. Would avoid  cardioversion at present, as I do not think she is able for a TEE guided cardioversion due to her history of requirement Diuresis with IV Lasix 20 mg twice daily. Check BNP, chest x-ray.  Last BNP check on 12/18 showed elevated BNP at 717.  Troponin elevation: Likely secondary to demand ischemia.  Cardiology will continue to follow.    Nigel Mormon, MD Pager: [phone] Office: [phone]

## 2021-12-25 NOTE — Progress Notes (Addendum)
HD#13 SUBJECTIVE:  Patient Summary: Katelyn Mahoney is a 87 y.o. with a pertinent PMH of DMII, HTN, obesity, CKD, and chronic lower extremity edema who presented with a 1 week of dyspnea with associated cough admitted for Gram negative rod bacteremia now finished with antibiotics and pulmonary edema   Overnight Events: No acute events overnight  Interim History: This is hospital day 55 for Katelyn Mahoney who was seen and evaluated at the bedside this morning. She reports that her breathing is not as good as she would like it to be and she still has a lot of difficulty with the incentive spirometer. Denies any dizziness/lightheadedness/presyncope when moving from the bed to chair/ambulation, although she is not able to ambulate much secondary to weakness.  OBJECTIVE:  Vital Signs: Vitals:   12/24/21 2042 12/25/21 0539 12/25/21 0607 12/25/21 0705  BP:  (!) 92/59  (!) 98/50  Pulse: 61 76  62  Resp:  17  18  Temp: 97.7 F (36.5 C) 97.6 F (36.4 C)  (!) 97.3 F (36.3 C)  TempSrc: Oral Oral  Oral  SpO2: 92% 97%    Weight:   133.3 kg   Height:       Supplemental O2: Nasal Cannula SpO2: 97 % O2 Flow Rate (L/min): 2-3 L/min  Filed Weights   12/21/21 0455 12/22/21 0449 12/25/21 0607  Weight: 130.9 kg 130.2 kg 133.3 kg     Intake/Output Summary (Last 24 hours) at 12/25/2021 0857 Last data filed at 12/24/2021 1438 Gross per 24 hour  Intake 240 ml  Output --  Net 240 ml   Net IO Since Admission: -235.08 mL [12/25/21 0857]  Physical Exam: General: Pleasant, chronically ill elderly appearing female laying in bed. No acute distress. CV: Irregularly irregular. 1-2+ pitting edema bilateral LE Pulmonary: Diminished air movement in lung bases.  Abdominal: Soft, nontender, nondistended. Normal bowel sounds. Extremities: Palpable radial pulses, unable to palpate DP pulses. Mild resting tremor of left upper extremity. Skin: Warm and dry.  Neuro: A&Ox3. No focal deficit. Psych: Flat  affect      ASSESSMENT/PLAN:  Assessment: Principal Problem:   Urinary tract infection Active Problems:   Diabetes mellitus (HCC)   CKD stage G3b/A1, GFR 30-44 and albumin creatinine ratio <30 mg/g (HCC)   Acute kidney injury superimposed on chronic kidney disease (HCC)   At risk for sepsis   Acute congestive heart failure ([place])   Plan: #Proteus bacteremia due to UTI; resolved #Sepsis 2/2 above; resolved Blood cultures positive for proteus, sensitive for cefazolin s/p completed course of abx on 12/20. WBC elevated from 12.6 to 15.6 today, although patient remains afebrile and has no signs/symptoms of infection.  Patient is recommended to go to SNF, however, she would like to go home with home health PT/OT. Strongly recommended she reconsider SNF placement. - Encourage ambulation and mobilization  - Continue to monitor CBC   #Afib #Acute on chronic HFpEF #Pulmonary edema #Hypotension  Patient with shortness of breath and orthopnea, requiring 2-3 L , although remains stable today. EF 50-55% and elevated PAP.Will require supplemental O2 on discharge. Patient has been hypotensive, initially thought to be 2/2 to extra diuresis and medications, however, she remains hypotensive despite holding medications. Orthostatic vital signs were positive yesterday. Will consult cardiology to assist with rate/rhythm control medications given difficulty with the patient's hypotension.  - Cardiology consulted, appreciate recs - Eliquis 2.5 mg bid - Hold lasix 40 mg in setting of hypotension  - Holding metoprolol and amlodipine in setting of hypotension  -  500 cc LR bolus - Continue incentive spirometry  - Albuterol prn  - Wean O2 as able   #Acute on chronic kidney disease stage 3b Cr remains stable at 1.54.  - Avoid nephrotoxins   #T2DM A1c 7.4. On insulin and ozempic at home. No episodes of hypoglycemia in the last 24 hrs.  - Continue semglee 35u and novolog 6 tid - SSI  Best  Practice: Diet: Cardiac diet IVF: Fluids: none VTE: apixaban (ELIQUIS) tablet 2.5 mg Start: 12/15/21 1145 Code: DNR AB: None Therapy Recs: SNF, DME: none DISPO: Anticipated discharge to Home/SNF pending Medical stability and New oxygen.  Signature: Buddy Duty, D.O.  Internal Medicine Resident, PGY-1 [facility]  Pager: [phone] 8:57 AM, 12/25/2021   Please contact the on call pager after 5 pm and on weekends at [phone].

## 2021-12-25 NOTE — Progress Notes (Signed)
Mobility Specialist Progress Note   12/25/21 1720  Mobility  Activity Ambulated in room;Ambulated to bathroom  Level of Assistance Minimal assist, patient does 75% or more  Assistive Device Front wheel walker  Distance Ambulated (ft) 100 ft [phone])  Mobility Ambulated with assistance in room  Mobility Response Tolerated well  Mobility performed by Mobility specialist  $Mobility charge 1 Mobility   Received pt in chair having no complaints and agreeable. Before starting, pt needed to use BR. Successful void w/ no complaints during transfer. X4 seated breaks d/t SOB and DOE but pt highly motivated today to keep going. SpO2 desat to 86% for ~65mins, supplemental O2 applied per advisement of RN during session. SpO2 92% on 3LO2 during ambulation. Returned back to chair w/ NT in room and back on 2LO2.    Pre Mobility: 108 HR, 95% SpO2 on 2LO2 During Mobility: 149 HR, 86% SpO2 on 3LO2 Post Mobility: 129 HR,  92% SpO2 on [place] Mobility Specialist Phone Number [phone]

## 2021-12-25 NOTE — Progress Notes (Signed)
Physical Therapy Treatment Patient Details Name: Katelyn Mahoney MRN: [id number] DOB: 1934-02-03 Today's Date: 12/25/2021   History of Present Illness Pt is 87 y/o F presenting with fever and acute hypoxic respiratory failure, found to have UTI. PMH includes DM II, HTN, obesity, CKD, HLD, chronic LE edema.    PT Comments    Pt remains limited by fatigue at this time, reporting DOE and SOB with limited household ambulation. Pt does not require physical assistance for mobility during this session and demonstrates good LE power to transition into standing from surfaces of varying height. Pt reports a desire to return home rather than go to SNF. PT encourages the patient to limit ambulation to very short household distances, suggesting the patient's daughter assist with set-up in a centralized area, and the patient limit mobility to bedside commode transfers during the day when no assistance is available. Pt will benefit from continued acute PT services to aide in improving activity tolerance.   Recommendations for follow up therapy are one component of a multi-disciplinary discharge planning process, led by the attending physician.  Recommendations may be updated based on patient status, additional functional criteria and insurance authorization.  Follow Up Recommendations  Home health PT (pt declining SNF placement, prefers home health PT)     Assistance Recommended at Discharge Intermittent Supervision/Assistance  Equipment Recommendations  None recommended by PT    Recommendations for Other Services       Precautions / Restrictions Precautions Precautions: Fall;Other (comment) Precaution Comments: Monitor vitals- O2 Restrictions Weight Bearing Restrictions: No     Mobility  Bed Mobility                    Transfers Overall transfer level: Needs assistance Equipment used: Rolling walker (2 wheels) Transfers: Sit to/from Stand;Bed to chair/wheelchair/BSC Sit to Stand:  Supervision     Step pivot transfers: Supervision          Ambulation/Gait Ambulation/Gait assistance: Min guard Gait Distance (Feet): 60 Feet (60 x 2) Assistive device: Rolling walker (2 wheels) Gait Pattern/deviations: Step-through pattern;Wide base of support Gait velocity: reduced Gait velocity interpretation: <1.8 ft/sec, indicate of risk for recurrent falls   General Gait Details: pt with slowed step-through gait, widened BOS, fatigues quickly   Stairs             Wheelchair Mobility    Modified Rankin (Stroke Patients Only)       Balance Overall balance assessment: Needs assistance Sitting-balance support: No upper extremity supported;Feet supported Sitting balance-Leahy Scale: Fair     Standing balance support: Bilateral upper extremity supported;Reliant on assistive device for balance Standing balance-Leahy Scale: Poor                              Cognition Arousal/Alertness: Awake/alert Behavior During Therapy: WFL for tasks assessed/performed Overall Cognitive Status: Within Functional Limits for tasks assessed                                          Exercises      General Comments General comments (skin integrity, edema, etc.): pt on 2L [place] upon PT arrival, pt reports significant DOE and SOB when ambulating on 2L [place], requiring 2+ minutes to recover along with increase in supplemental oxygen to 4L. Pt ambulates back to room on 4L [place], continuing to report mild SOB.  SpO2 94% by the time PT able to obtain pulse ox to assess. Pt reports DOE to be much more significant than at her baseline      Pertinent Vitals/Pain Pain Assessment: No/denies pain    Home Living                          Prior Function            PT Goals (current goals can now be found in the care plan section) Acute Rehab PT Goals Patient Stated Goal: to go home Progress towards PT goals: Not progressing toward goals - comment  (limited by fatigue)    Frequency    Min 3X/week      PT Plan Discharge plan needs to be updated    Co-evaluation              AM-PAC PT "6 Clicks" Mobility   Outcome Measure  Help needed turning from your back to your side while in a flat bed without using bedrails?: A Little Help needed moving from lying on your back to sitting on the side of a flat bed without using bedrails?: A Little Help needed moving to and from a bed to a chair (including a wheelchair)?: A Little Help needed standing up from a chair using your arms (e.g., wheelchair or bedside chair)?: A Little Help needed to walk in hospital room?: A Little Help needed climbing 3-5 steps with a railing? : A Little 6 Click Score: 18    End of Session Equipment Utilized During Treatment: Oxygen Activity Tolerance: Patient limited by fatigue Patient left: in chair;with call bell/phone within reach;with chair alarm set Nurse Communication: Mobility status PT Visit Diagnosis: Muscle weakness (generalized) (M62.81)     Time: 4492-0100 PT Time Calculation (min) (ACUTE ONLY): 27 min  Charges:  $Gait Training: 8-22 mins $Therapeutic Activity: 8-22 mins                     Zenaida Niece, PT, DPT Acute Rehabilitation Pager: [phone] Office [phone]    Zenaida Niece 12/25/2021, 12:29 PM

## 2021-12-26 ENCOUNTER — Inpatient Hospital Stay (HOSPITAL_COMMUNITY): Payer: Medicare Other

## 2021-12-26 LAB — BASIC METABOLIC PANEL
Anion gap: 6 (ref 5–15)
BUN: 39 mg/dL — ABNORMAL HIGH (ref 8–23)
CO2: 26 mmol/L (ref 22–32)
Calcium: 8.5 mg/dL — ABNORMAL LOW (ref 8.9–10.3)
Chloride: 104 mmol/L (ref 98–111)
Creatinine, Ser: 1.59 mg/dL — ABNORMAL HIGH (ref 0.44–1.00)
GFR, Estimated: 32 mL/min — ABNORMAL LOW (ref >60)
Glucose, Bld: 134 mg/dL — ABNORMAL HIGH (ref 70–99)
Potassium: 4.3 mmol/L (ref 3.5–5.1)
Sodium: 136 mmol/L (ref 135–145)

## 2021-12-26 LAB — GLUCOSE, CAPILLARY
Glucose-Capillary: 127 mg/dL — ABNORMAL HIGH (ref 70–99)
Glucose-Capillary: 110 mg/dL — ABNORMAL HIGH (ref 70–99)
Glucose-Capillary: 127 mg/dL — ABNORMAL HIGH (ref 70–99)
Glucose-Capillary: 174 mg/dL — ABNORMAL HIGH (ref 70–99)

## 2021-12-26 LAB — CBC
HCT: 33.7 % — ABNORMAL LOW (ref 36.0–46.0)
Hemoglobin: 11 g/dL — ABNORMAL LOW (ref 12.0–15.0)
MCH: 33.8 pg (ref 26.0–34.0)
MCHC: 32.6 g/dL (ref 30.0–36.0)
MCV: 103.7 fL — ABNORMAL HIGH (ref 80.0–100.0)
Platelets: 429 10*3/uL — ABNORMAL HIGH (ref 150–400)
RBC: 3.25 MIL/uL — ABNORMAL LOW (ref 3.87–5.11)
RDW: 15.1 % (ref 11.5–15.5)
WBC: 14.4 10*3/uL — ABNORMAL HIGH (ref 4.0–10.5)
nRBC: 0.6 % — ABNORMAL HIGH (ref 0.0–0.2)

## 2021-12-26 LAB — TSH: TSH: 0.959 u[IU]/mL (ref 0.350–4.500)

## 2021-12-26 LAB — BRAIN NATRIURETIC PEPTIDE: B Natriuretic Peptide: 343.6 pg/mL — ABNORMAL HIGH (ref 0.0–100.0)

## 2021-12-26 MED ORDER — FUROSEMIDE 10 MG/ML IJ SOLN
20.0000 mg | Freq: Two times a day (BID) | INTRAMUSCULAR | Status: DC
  Administered 2021-12-26 – 2021-12-27 (×3): 20 mg via INTRAVENOUS
  Filled 2021-12-26 (×3): qty 2

## 2021-12-26 MED ORDER — DILTIAZEM HCL ER COATED BEADS 120 MG PO CP24
120.0000 mg | ORAL_CAPSULE | Freq: Every day | ORAL | Status: DC
  Administered 2021-12-26 – 2021-12-27 (×2): 120 mg via ORAL
  Filled 2021-12-26 (×2): qty 1

## 2021-12-26 NOTE — Progress Notes (Signed)
HD#14 SUBJECTIVE:  Patient Summary: Katelyn Mahoney is a 87 y.o. with a pertinent PMH of DMII, HTN, obesity, CKD, and chronic lower extremity edema who presented with a 1 week of dyspnea with associated cough admitted for Gram negative rod bacteremia now finished with antibiotics and pulmonary edema   Overnight Events: No acute events overnight  Interim History: This is hospital day 14 for Katelyn Mahoney who was seen and evaluated at the bedside this morning. She feels about the same as yesterday. She denies any shortness of breath at rest, but does get short of breath when exerting herself. Denies any lightheadedness or presyncope.   OBJECTIVE:  Vital Signs: Vitals:   12/25/21 1335 12/25/21 1747 12/25/21 1952 12/26/21 0319  BP: 110/75 117/70 122/63 115/75  Pulse: 92 68 75 (!) 56  Resp: 17 19 16 16   Temp: 97.7 F (36.5 C) 97.7 F (36.5 C) 98.1 F (36.7 C) (!) 97.5 F (36.4 C)  TempSrc: Oral Oral Oral   SpO2:  95% 97% 95%  Weight:      Height:       Supplemental O2: Nasal Cannula SpO2: 97 % O2 Flow Rate (L/min): 2-3 L/min  Filed Weights   12/21/21 0455 12/22/21 0449 12/25/21 0607  Weight: 130.9 kg 130.2 kg 133.3 kg     Intake/Output Summary (Last 24 hours) at 12/26/2021 0656 Last data filed at 12/25/2021 1335 Gross per 24 hour  Intake 360 ml  Output --  Net 360 ml   Net IO Since Admission: 124.92 mL [12/26/21 0656]  Physical Exam: General: Pleasant, chronically ill appearing elderly female laying in bed. No acute distress. CV: Irregularly irregular. 1+ bilateral pitting edema.  Pulmonary: Diminished air movement in lung bases. No work of breathing.  Abdominal: Soft, nontender, nondistended. Normal bowel sounds. Extremities: Palpable radial pulses. Mild resting tremor LUE.  Skin: Warm and dry.  Neuro: A&Ox3. No focal deficit. Psych: Normal mood and affect   ASSESSMENT/PLAN:  Assessment: Principal Problem:   Urinary tract infection Active Problems:    Diabetes mellitus (HCC)   CKD stage G3b/A1, GFR 30-44 and albumin creatinine ratio <30 mg/g (HCC)   Acute kidney injury superimposed on chronic kidney disease (HCC)   At risk for sepsis   Acute congestive heart failure ([place])   Plan: #Proteus bacteremia due to UTI; resolved #Sepsis 2/2 above; resolved Blood cultures positive for proteus, sensitive for cefazolin s/p completed course of abx on 12/20. Leukocytosis slightly improved from 15.6 to 14.4 today.  Patient is recommended to go to SNF, however, she would like to go home with home health PT/OT. Strongly recommended she reconsider SNF placement, as she does not have 24/7 assistance at home. - Encourage ambulation and mobilization  - Continue to monitor CBC   #New onset Afib #Acute on chronic HFpEF #Pulmonary edema #Hypotension  Patient with shortness of breath and orthopnea, still requiring 2 L [place]. EF 50-55% and elevated PAP. Will require supplemental O2 on discharge. Cardiology consulted yesterday and restarted IV diuresis and metoprolol 25 mg bid; likely not a candidate for TEE guided cardioversion. BNP improved from 717 on admission to 343, although still elevated.  - Cardiology consulted, appreciate recs - Eliquis 2.5 mg bid - Restart Lasix 20 mg IV bid - Continue metoprolol 25 mg bid - Continue to hold amlodipine - Continue incentive spirometry  - Albuterol prn  - Wean O2 as able - Repeat orthostatics    #Acute on chronic kidney disease stage 3b Cr remains stable at 1.59.  -  Avoid nephrotoxins - Continue to monitor BMP   #T2DM A1c 7.4. On insulin and ozempic at home. CBGs 127-174 over last 24 hrs.  - Continue semglee 35u and novolog 6 tid - SSI  Best Practice: Diet: Cardiac diet IVF: Fluids: none VTE: apixaban (ELIQUIS) tablet 2.5 mg Start: 12/15/21 1145 Code: DNR AB: None Therapy Recs: SNF, DME: none DISPO: Anticipated discharge to Home/SNF pending Medical stability and New oxygen.  Signature: Buddy Duty,  D.O.  Internal Medicine Resident, PGY-1 [facility]  Pager: [phone] 6:56 AM, 12/26/2021   Please contact the on call pager after 5 pm and on weekends at [phone].

## 2021-12-26 NOTE — Progress Notes (Signed)
Physical Therapy Treatment Patient Details Name: Katelyn Mahoney MRN: [id number] DOB: May 14, 1934 Today's Date: 12/26/2021   History of Present Illness Pt is 87 y/o F presenting with fever and acute hypoxic respiratory failure, found to have UTI. PMH includes DM II, HTN, obesity, CKD, HLD, chronic LE edema.    PT Comments    Patient reports feeling okay this morning. Continues to demonstrate fatigue and decreased cardiovascular endurance impacting mobility. Noted to have 3/4 DOE with minimal activity and needing seated rest breaks between bouts. Sp02 dropped to 84% on 2L/min 02 [place] with walk to bathroom, increased 02 to 3L and able to maintain >90% with activity for hallway walking. Cues provided for pursed lip breathing and energy conservation techniques. Recommend continuing to work with mobility tech to improve overall endurance and IS. Will follow.   Recommendations for follow up therapy are one component of a multi-disciplinary discharge planning process, led by the attending physician.  Recommendations may be updated based on patient status, additional functional criteria and insurance authorization.  Follow Up Recommendations  Home health PT (declining SNF, prefers HHPT)     Assistance Recommended at Discharge Intermittent Supervision/Assistance  Equipment Recommendations  None recommended by PT    Recommendations for Other Services       Precautions / Restrictions Precautions Precautions: Fall;Other (comment) Precaution Comments: Monitor vitals- O2 Restrictions Weight Bearing Restrictions: No     Mobility  Bed Mobility               General bed mobility comments: Pt seated in recliner upon arrival    Transfers Overall transfer level: Needs assistance Equipment used: Rolling walker (2 wheels) Transfers: Sit to/from Stand Sit to Stand: Min guard;Min assist           General transfer comment: Min guard from recliner and Min A from low toilet with use of  grab bar. Stood from rolling chair x1.    Ambulation/Gait Ambulation/Gait assistance: Min guard Gait Distance (Feet): 30 Feet (x2 bouts + 15' x2 bouts) Assistive device: Rolling walker (2 wheels) Gait Pattern/deviations: Step-through pattern;Wide base of support Gait velocity: reduced     General Gait Details: pt with slowed step-through gait, widened BOS, fatigues quickly requiring seated rest break. 3/4 DOE. Sp02 dropped to 84% on 2L/min 02 [place], increased to 3L and able to maitnain Sp02 >90%. HR up to 144 bpm max with activity, A-fib.   Stairs             Wheelchair Mobility    Modified Rankin (Stroke Patients Only)       Balance Overall balance assessment: Needs assistance Sitting-balance support: No upper extremity supported;Feet supported Sitting balance-Leahy Scale: Fair     Standing balance support: During functional activity Standing balance-Leahy Scale: Poor Standing balance comment: Reliant on UEs for support                            Cognition Arousal/Alertness: Awake/alert Behavior During Therapy: WFL for tasks assessed/performed Overall Cognitive Status: Within Functional Limits for tasks assessed                                          Exercises      General Comments General comments (skin integrity, edema, etc.): Sp02 dropped to 84% on 2L/min 02 [place] with walk to bathroom, increased 02 to 3L and able to maintain >  90% with activity.      Pertinent Vitals/Pain Pain Assessment: No/denies pain    Home Living                          Prior Function            PT Goals (current goals can now be found in the care plan section) Progress towards PT goals: Not progressing toward goals - comment (limited by fatigue)    Frequency    Min 3X/week      PT Plan Current plan remains appropriate    Co-evaluation              AM-PAC PT "6 Clicks" Mobility   Outcome Measure  Help needed turning  from your back to your side while in a flat bed without using bedrails?: A Little Help needed moving from lying on your back to sitting on the side of a flat bed without using bedrails?: A Little Help needed moving to and from a bed to a chair (including a wheelchair)?: A Little Help needed standing up from a chair using your arms (e.g., wheelchair or bedside chair)?: A Little Help needed to walk in hospital room?: A Little Help needed climbing 3-5 steps with a railing? : A Little 6 Click Score: 18    End of Session Equipment Utilized During Treatment: Oxygen Activity Tolerance: Patient limited by fatigue;Treatment limited secondary to medical complications (Comment) (drop in 02) Patient left: in chair;with call bell/phone within reach;with chair alarm set Nurse Communication: Mobility status PT Visit Diagnosis: Muscle weakness (generalized) (M62.81)     Time: 6599-3570 PT Time Calculation (min) (ACUTE ONLY): 24 min  Charges:  $Gait Training: 8-22 mins $Therapeutic Activity: 8-22 mins                     Marisa Severin, PT, DPT Acute Rehabilitation Services Pager [phone] Office [phone]      Marguarite Arbour A [place] 12/26/2021, 11:07 AM

## 2021-12-26 NOTE — Progress Notes (Signed)
Mobility Specialist Progress Note:   12/26/21 1345  Mobility  Activity Ambulated in room;Ambulated to bathroom  Level of Assistance Standby assist, set-up cues, supervision of patient - no hands on  Assistive Device Front wheel walker  Distance Ambulated (ft) 80 ft  Mobility Out of bed for toileting;Ambulated with assistance in room  Mobility Response Tolerated fair  Mobility performed by Mobility specialist  Bed Position Chair  $Mobility charge 1 Mobility   Received on 2LO2 up in chair. Pt requesting to use BR prior to session. BM successful. Pt desat to 85% on 3LO2, 89% on 4LO2. Pt with significant SOB/wheezing during session. Placed back on 2LO2 at rest, in chair.    Katelyn Mahoney Mobility Specialist  Phone [phone]

## 2021-12-26 NOTE — Progress Notes (Signed)
Occupational Therapy Treatment Patient Details Name: Katelyn Mahoney MRN: [id number] DOB: 04-Oct-1934 Today's Date: 12/26/2021   History of present illness Pt is 87 y/o F presenting with fever and acute hypoxic respiratory failure, found to have UTI. PMH includes DM II, HTN, obesity, CKD, HLD, chronic LE edema.   OT comments  Pt making progress with functional goals. Pt seated in recliner upon arrival. Session focused on functional mobility using RW to walk to bathroom to transfer to 3 in 1, toileting tasks, grooming/hygiene standing at sink UB dressing standing. Pt continues to demonstrate fatigue and decreased endurance impacting ADL and ADL mobility. Noted to have 3/4 DOE with minimal activity and needing rest breaks to complete tasks. O2 SATs dropped to 84-85% on 2L/min 02 [place] with walk to bathroom, increased O2 to 3L and able to maintain >90% with activity standing at sink. Verbal cues provided for pursed lip breathing and energy conservation techniques. OT will continue to follow acutely to maximize level of function and safety   Recommendations for follow up therapy are one component of a multi-disciplinary discharge planning process, led by the attending physician.  Recommendations may be updated based on patient status, additional functional criteria and insurance authorization.    Follow Up Recommendations  Home health OT (refusing SNF, wants HH therapies)    Assistance Recommended at Discharge Frequent or constant Supervision/Assistance  Equipment Recommendations  BSC/3in1    Recommendations for Other Services      Precautions / Restrictions Precautions Precautions: Fall;Other (comment) Precaution Comments: Monitor vitals- O2 Restrictions Weight Bearing Restrictions: No       Mobility Bed Mobility               General bed mobility comments: Pt seated in recliner upon arrival    Transfers Overall transfer level: Needs assistance Equipment used: Rolling walker (2  wheels) Transfers: Sit to/from Stand Sit to Stand: Min guard;Min assist   Step pivot transfers: Supervision             Balance Overall balance assessment: Needs assistance Sitting-balance support: No upper extremity supported;Feet supported Sitting balance-Leahy Scale: Fair     Standing balance support: During functional activity Standing balance-Leahy Scale: Poor                             ADL either performed or assessed with clinical judgement   ADL Overall ADL's : Needs assistance/impaired     Grooming: Supervision/safety;Standing;Wash/dry face;Wash/dry hands       Lower Body Bathing: Moderate assistance;Sitting/lateral leans;Sit to/from stand Lower Body Bathing Details (indicate cue type and reason): simulated Upper Body Dressing : Min guard;Supervision/safety;Standing Upper Body Dressing Details (indicate cue type and reason): changed gown     Toilet Transfer: Min guard;Supervision/safety;Ambulation;Rolling walker (2 wheels);BSC/3in1;Grab bars   Toileting- Clothing Manipulation and Hygiene: Min guard;Sit to/from stand       Functional mobility during ADLs: Rolling walker (2 wheels);Minimal assistance;Min guard;Supervision/safety General ADL Comments: reviewed energy conservation for ADLs and ADL mobility, pursed lip breathing techniques    Extremity/Trunk Assessment Upper Extremity Assessment Upper Extremity Assessment: Generalized weakness   Lower Extremity Assessment Lower Extremity Assessment: Defer to PT evaluation   Cervical / Trunk Assessment Cervical / Trunk Assessment: Kyphotic    Vision Baseline Vision/History: 1 Wears glasses Ability to See in Adequate Light: 0 Adequate Patient Visual Report: No change from baseline     Perception     Praxis      Cognition Arousal/Alertness: Awake/alert  Behavior During Therapy: WFL for tasks assessed/performed Overall Cognitive Status: Within Functional Limits for tasks assessed                                             Exercises     Shoulder Instructions       General Comments      Pertinent Vitals/ Pain       Pain Assessment: No/denies pain Pain Score: 0-No pain Pain Intervention(s): Monitored during session  Home Living                                          Prior Functioning/Environment              Frequency  Min 2X/week        Progress Toward Goals  OT Goals(current goals can now be found in the care plan section)  Progress towards OT goals: Progressing toward goals     Plan Discharge plan remains appropriate;Frequency remains appropriate    Co-evaluation                 AM-PAC OT "6 Clicks" Daily Activity     Outcome Measure   Help from another person eating meals?: None Help from another person taking care of personal grooming?: None Help from another person toileting, which includes using toliet, bedpan, or urinal?: A Little Help from another person bathing (including washing, rinsing, drying)?: A Lot Help from another person to put on and taking off regular upper body clothing?: A Little Help from another person to put on and taking off regular lower body clothing?: A Lot 6 Click Score: 18    End of Session Equipment Utilized During Treatment: Rolling walker (2 wheels);Oxygen;Other (comment) (3 in 1)  OT Visit Diagnosis: Unsteadiness on feet (R26.81);Muscle weakness (generalized) (M62.81)   Activity Tolerance Patient tolerated treatment well;Patient limited by fatigue   Patient Left in chair;with call bell/phone within reach;with chair alarm set   Nurse Communication          Time: 1505-6979 OT Time Calculation (min): 16 min  Charges: OT General Charges $OT Visit: 1 Visit OT Treatments $Self Care/Home Management : 8-22 mins    Britt Bottom 12/26/2021, 3:37 PM

## 2021-12-26 NOTE — Progress Notes (Signed)
°   12/26/21 0842  Vitals  BP 120/69  MAP (mmHg) 82  BP Location Right Arm  BP Method Automatic  Patient Position (if appropriate) Standing  Pulse Rate (!) 125  Pulse Rate Source Monitor  Resp (!) 22  MEWS COLOR  MEWS Score Color Yellow  MEWS Score  MEWS Temp 0  MEWS Systolic 0  MEWS Pulse 2  MEWS RR 1  MEWS LOC 0  MEWS Score 3   This was a yellow MEWS induced by pt standing up for postural vital signs checks. Yellow mews protocol not initiated

## 2021-12-26 NOTE — Progress Notes (Signed)
Subjective:  Feels okay  HR still high BP better BNP 343  Objective:  Vital Signs in the last 24 hours: Temp:  [97.3 F (36.3 C)-98.1 F (36.7 C)] 97.3 F (36.3 C) (12/27 0754) Pulse Rate:  [53-125] 53 (12/27 0845) Resp:  [16-22] 20 (12/27 0845) BP: (106-122)/(55-75) 120/69 (12/27 0842) SpO2:  [95 %-97 %] 97 % (12/27 0754)  Intake/Output from previous day: 12/26 0701 - 12/27 0700 In: 360 [P.O.:360] Out: -   Physical Exam Vitals and nursing note reviewed.  Constitutional:      General: She is not in acute distress.    Appearance: She is well-developed. She is obese.  HENT:     Head: Normocephalic and atraumatic.  Eyes:     Conjunctiva/sclera: Conjunctivae normal.     Pupils: Pupils are equal, round, and reactive to light.  Neck:     Vascular: No JVD.  Cardiovascular:     Rate and Rhythm: Tachycardia present. Rhythm irregular.     Pulses: Normal pulses and intact distal pulses.     Heart sounds: No murmur heard. Pulmonary:     Effort: Pulmonary effort is normal.     Breath sounds: Examination of the right-lower field reveals rales. Examination of the left-lower field reveals rales. Rales present. No wheezing.  Abdominal:     General: Bowel sounds are normal.     Palpations: Abdomen is soft.     Tenderness: There is no rebound.  Musculoskeletal:        General: No tenderness. Normal range of motion.     Right lower leg: Edema (1+) present.     Left lower leg: Edema (1+) present.  Lymphadenopathy:     Cervical: No cervical adenopathy.  Skin:    General: Skin is warm and dry.  Neurological:     Mental Status: She is alert and oriented to person, place, and time.     Cranial Nerves: No cranial nerve deficit.     Lab Results: BMP Recent Labs    12/24/21 1122 12/25/21 0114 12/26/21 0117  NA 138 140 136  K 4.3 4.5 4.3  CL 101 105 104  CO2 31 25 26   GLUCOSE 176* 79 134*  BUN 39* 40* 39*  CREATININE 1.52* 1.54* 1.59*  CALCIUM 8.7* 8.6* 8.5*  GFRNONAA  33* 33* 32*    CBC Recent Labs  Lab 12/26/21 0117  WBC 14.4*  RBC 3.25*  HGB 11.0*  HCT 33.7*  PLT 429*  MCV 103.7*  MCH 33.8  MCHC 32.6  RDW 15.1    HEMOGLOBIN A1C Lab Results  Component Value Date   HGBA1C 7.4 (H) 11/13/2021    Cardiac Panel (last 3 results) No results for input(s): CKTOTAL, CKMB, TROPONINI, RELINDX in the last 8760 hours.  BNP (last 3 results) Recent Labs    12/12/21 1151 12/17/21 0031 12/26/21 0117  BNP 490.1* 717.5* 343.6*    TSH No results for input(s): TSH in the last 8760 hours.  Lipid Panel     Component Value Date/Time   CHOL 161 09/11/2021 0932   TRIG 130.0 09/11/2021 0932   HDL 66.80 09/11/2021 0932   CHOLHDL 2 09/11/2021 0932   VLDL 26.0 09/11/2021 0932   LDLCALC 68 09/11/2021 0932   LDLCALC 81 09/08/2020 0840   LDLDIRECT 78.3 04/16/2013 0816     Hepatic Function Panel Recent Labs    12/12/21 1351 12/13/21 0837 12/14/21 0310 12/25/21 0114  PROT 6.2* 6.5 6.6 5.7*  ALBUMIN 2.5* 2.4* 2.4* 2.4*  AST 49*  24 19 20   ALT 31 29 21 9   ALKPHOS 92 96 85 71  BILITOT 1.8* 0.9 0.6 0.6  BILIDIR 0.8* 0.3*  --  0.1  IBILI 1.0* 0.6  --  0.5    Imaging: CXR 12/26/2021: Stable low volume chest with bilateral infiltrates.  Telemetry 12/26/2021: HR 110-120 bpm  Cardiac Studies:  EKG 12/25/2021: Afib 104 bpm Occasional PVCs   Echocardiogram 12/13/2021:  1. Left ventricular ejection fraction, by estimation, is 50 to 55%. The  left ventricle has low normal function. The left ventricle has no regional  wall motion abnormalities. Left ventricular diastolic parameters are  consistent with Grade II diastolic  dysfunction (pseudonormalization).   2. Right ventricular systolic function is normal. The right ventricular  size is normal. There is moderately elevated pulmonary artery systolic  pressure. The estimated right ventricular systolic pressure is 16.0 mmHg.   3. A small pericardial effusion is present. The pericardial  effusion is  circumferential.   4. The mitral valve is degenerative. Moderate mitral valve regurgitation.  No evidence of mitral stenosis.   5. The aortic valve is tricuspid. There is mild calcification of the  aortic valve. There is mild thickening of the aortic valve. Aortic valve  regurgitation is trivial. Aortic valve sclerosis/calcification is present,  without any evidence of aortic  stenosis.   6. The inferior vena cava is dilated in size with <50% respiratory  variability, suggesting right atrial pressure of 15 mmHg.   Comparison(s): A prior study was performed on 2010. MR has increased from  2010 report.        Assessment & Recommendations:   87 y.o. Caucasian female  with hypertension, type 2 diabetes mellitus, obesity, admitted on 12/12/2021 with acute hypoxic respiratory failure, UTI.   A. Fib: New diagnosis, currently with ventricular rate around 110 bpm at rest. Baseline respiratory status contributing. Would avoid amio or TEE/cardioversion given her respiratory status. She is not to keen on having the cardioversion now either. Continue rate control. Tolerating metoprolol tartrate 25 mg bid. Added diltiazem 120 mg daily. Diuresis with IV Lasix 20 mg twice daily.   Troponin elevation: Likely secondary to demand ischemia.   Cardiology will continue to follow.    Nigel Mormon, MD Pager: [phone] Office: [phone]

## 2021-12-26 NOTE — Progress Notes (Signed)
Mobility Specialist Progress Note:   12/26/21 0950  Mobility  Activity Ambulated in room  Level of Assistance Standby assist, set-up cues, supervision of patient - no hands on  Assistive Device Front wheel walker  Distance Ambulated (ft) 110 ft  Mobility Out of bed for toileting;Ambulated with assistance in room  Mobility Response Tolerated well  Mobility performed by Mobility specialist  Bed Position Chair  $Mobility charge 1 Mobility   Pt received in chair on 2LO2. No physical assistance required for gait. Pt desat to 86% on 3LO2, able to stay at or above 90% on 4LO2 while ambulating. Requested to go to BR to void prior to end of session. Pt placed back on 2LO2 at rest, sitting up in chair with all needs met.  Nelta Numbers Mobility Specialist  Phone [phone]

## 2021-12-27 DIAGNOSIS — I4891 Unspecified atrial fibrillation: Secondary | ICD-10-CM

## 2021-12-27 DIAGNOSIS — R319 Hematuria, unspecified: Secondary | ICD-10-CM | POA: Diagnosis not present

## 2021-12-27 DIAGNOSIS — N390 Urinary tract infection, site not specified: Secondary | ICD-10-CM | POA: Diagnosis not present

## 2021-12-27 LAB — CBC
HCT: 34.4 % — ABNORMAL LOW (ref 36.0–46.0)
Hemoglobin: 11.4 g/dL — ABNORMAL LOW (ref 12.0–15.0)
MCH: 33.9 pg (ref 26.0–34.0)
MCHC: 33.1 g/dL (ref 30.0–36.0)
MCV: 102.4 fL — ABNORMAL HIGH (ref 80.0–100.0)
Platelets: 183 10*3/uL (ref 150–400)
RBC: 3.36 MIL/uL — ABNORMAL LOW (ref 3.87–5.11)
RDW: 15.3 % (ref 11.5–15.5)
WBC: 11.2 10*3/uL — ABNORMAL HIGH (ref 4.0–10.5)
nRBC: 0.8 % — ABNORMAL HIGH (ref 0.0–0.2)

## 2021-12-27 LAB — GLUCOSE, CAPILLARY
Glucose-Capillary: 141 mg/dL — ABNORMAL HIGH (ref 70–99)
Glucose-Capillary: 137 mg/dL — ABNORMAL HIGH (ref 70–99)

## 2021-12-27 LAB — MAGNESIUM: Magnesium: 2 mg/dL (ref 1.7–2.4)

## 2021-12-27 LAB — BASIC METABOLIC PANEL
Anion gap: 11 (ref 5–15)
BUN: 40 mg/dL — ABNORMAL HIGH (ref 8–23)
CO2: 25 mmol/L (ref 22–32)
Calcium: 8.5 mg/dL — ABNORMAL LOW (ref 8.9–10.3)
Chloride: 102 mmol/L (ref 98–111)
Creatinine, Ser: 1.63 mg/dL — ABNORMAL HIGH (ref 0.44–1.00)
GFR, Estimated: 31 mL/min — ABNORMAL LOW (ref >60)
Glucose, Bld: 118 mg/dL — ABNORMAL HIGH (ref 70–99)
Potassium: 4.6 mmol/L (ref 3.5–5.1)
Sodium: 138 mmol/L (ref 135–145)

## 2021-12-27 MED ORDER — INSULIN ASPART 100 UNIT/ML IJ SOLN: 6.0000 [IU] | Freq: Three times a day (TID) | INTRAMUSCULAR | 11 refills | Status: DC

## 2021-12-27 MED ORDER — METOPROLOL SUCCINATE ER 100 MG PO TB24: 50.0000 mg | ORAL_TABLET | Freq: Every day | ORAL | 1 refills | Status: DC

## 2021-12-27 MED ORDER — MELATONIN 3 MG PO TABS
3.0000 mg | ORAL_TABLET | Freq: Every day | ORAL | 0 refills | Status: AC
Start: 2021-12-27 — End: 2022-01-26

## 2021-12-27 MED ORDER — PANTOPRAZOLE SODIUM 40 MG PO TBEC: 40.0000 mg | DELAYED_RELEASE_TABLET | Freq: Every day | ORAL | 0 refills | Status: DC

## 2021-12-27 MED ORDER — APIXABAN 2.5 MG PO TABS: 2.5000 mg | ORAL_TABLET | Freq: Two times a day (BID) | ORAL | 0 refills | Status: DC

## 2021-12-27 MED ORDER — CYANOCOBALAMIN 1000 MCG PO TABS: 1000.0000 ug | ORAL_TABLET | Freq: Every day | ORAL | 0 refills | Status: AC

## 2021-12-27 MED ORDER — DILTIAZEM HCL ER COATED BEADS 120 MG PO CP24: 120.0000 mg | ORAL_CAPSULE | Freq: Every day | ORAL | 0 refills | Status: DC

## 2021-12-27 MED ORDER — CYANOCOBALAMIN 1000 MCG PO TABS: 1000.0000 ug | ORAL_TABLET | Freq: Every day | ORAL | 0 refills | Status: DC

## 2021-12-27 MED ORDER — LEVALBUTEROL HCL 0.63 MG/3ML IN NEBU: 0.6300 mg | INHALATION_SOLUTION | Freq: Four times a day (QID) | RESPIRATORY_TRACT | 12 refills | Status: DC | PRN

## 2021-12-27 MED ORDER — MELATONIN 3 MG PO TABS: 3.0000 mg | ORAL_TABLET | Freq: Every day | ORAL | 0 refills | Status: DC

## 2021-12-27 MED ORDER — INSULIN LISPRO (1 UNIT DIAL) 100 UNIT/ML (KWIKPEN): 6.0000 [IU] | PEN_INJECTOR | Freq: Three times a day (TID) | SUBCUTANEOUS | 0 refills | Status: AC

## 2021-12-27 NOTE — TOC Progression Note (Signed)
Transition of Care [facility]) - Progression Note    Patient Details  Name: Katelyn Mahoney MRN: [id number] Date of Birth: 05/09/1934  Transition of Care [facility]) CM/SW Contact  Jacalyn Lefevre Edson Snowball, RN Phone Number: 12/27/2021, 11:39 AM  Clinical Narrative:     DME was delivered to patient's room last week including home oxygen. NCM messaged Tommi Rumps with [facility] regarding discharge today    Expected Discharge Plan: [place] Barriers to Discharge: Continued Medical Work up, Ship broker  Expected Discharge Plan and Services Expected Discharge Plan: [place] In-house Referral: Clinical Social Work Discharge Planning Services: CM Consult Post Acute Care Choice: [place] arrangements for the past 2 months: Single Family Home Expected Discharge Date: 12/27/21               DME Arranged: 3-N-1         HH Arranged: PT, OT HH Agency: [place] Date [facility] Agency Contacted: 12/20/21 Time HH Agency Contacted: 1600 Representative spoke with at [place]: [place]) Interventions    Readmission Risk Interventions No flowsheet data found.

## 2021-12-27 NOTE — Progress Notes (Signed)
Mobility Specialist Progress Note:   12/27/21 0950  Mobility  Activity Ambulated in room;Ambulated to bathroom  Level of Assistance Standby assist, set-up cues, supervision of patient - no hands on  Assistive Device Front wheel walker  Distance Ambulated (ft) 80 ft  Mobility Out of bed for toileting;Sit up in bed/chair position for meals;Ambulated with assistance in room  Mobility Response Tolerated fair  Mobility performed by Mobility specialist  Bed Position Chair  $Mobility charge 1 Mobility   Received on 2LO2 in bed, pt requesting to go to BR prior to session, BM successful. Pt with increased SOB this am, requiring 5LO2 to maintain SpO2 >88%. Pt states she has some congestion this morning, which is making it difficult to catch her breath. Pt up in chair, able to keep SpO2 >90% on 2LO2 at rest.   [place] Specialist  Phone [phone]

## 2021-12-27 NOTE — Progress Notes (Addendum)
Subjective:  Feels okay  HR better controlled BNP 343  Objective:  Vital Signs in the last 24 hours: Temp:  [97.8 F (36.6 C)-98.2 F (36.8 C)] 98 F (36.7 C) (12/28 0727) Pulse Rate:  [65-97] 97 (12/28 0727) Resp:  [16-18] 16 (12/28 0727) BP: (108-121)/(56-66) 108/60 (12/28 0727) SpO2:  [95 %-98 %] 96 % (12/28 0727)  Intake/Output from previous day: 12/27 0701 - 12/28 0700 In: 1080 [P.O.:1080] Out: 200 [Urine:200]  Physical Exam Vitals and nursing note reviewed.  Constitutional:      General: She is not in acute distress.    Appearance: She is well-developed. She is obese.  HENT:     Head: Normocephalic and atraumatic.  Eyes:     Conjunctiva/sclera: Conjunctivae normal.     Pupils: Pupils are equal, round, and reactive to light.  Neck:     Vascular: No JVD.  Cardiovascular:     Rate and Rhythm: Normal rate. Rhythm irregular.     Pulses: Normal pulses and intact distal pulses.     Heart sounds: No murmur heard. Pulmonary:     Effort: Pulmonary effort is normal.     Breath sounds: Examination of the right-lower field reveals rales. Examination of the left-lower field reveals rales. Rales present. No wheezing.  Abdominal:     General: Bowel sounds are normal.     Palpations: Abdomen is soft.     Tenderness: There is no rebound.  Musculoskeletal:        General: No tenderness. Normal range of motion.     Right lower leg: Edema (1+) present.     Left lower leg: Edema (1+) present.  Lymphadenopathy:     Cervical: No cervical adenopathy.  Skin:    General: Skin is warm and dry.  Neurological:     Mental Status: She is alert and oriented to person, place, and time.     Cranial Nerves: No cranial nerve deficit.     Lab Results: BMP Recent Labs    12/25/21 0114 12/26/21 0117 12/27/21 0110  NA 140 136 138  K 4.5 4.3 4.6  CL 105 104 102  CO2 25 26 25   GLUCOSE 79 134* 118*  BUN 40* 39* 40*  CREATININE 1.54* 1.59* 1.63*  CALCIUM 8.6* 8.5* 8.5*  GFRNONAA  33* 32* 31*     CBC Recent Labs  Lab 12/27/21 0543  WBC 11.2*  RBC 3.36*  HGB 11.4*  HCT 34.4*  PLT 183  MCV 102.4*  MCH 33.9  MCHC 33.1  RDW 15.3     HEMOGLOBIN A1C Lab Results  Component Value Date   HGBA1C 7.4 (H) 11/13/2021    Cardiac Panel (last 3 results) No results for input(s): CKTOTAL, CKMB, TROPONINI, RELINDX in the last 8760 hours.  BNP (last 3 results) Recent Labs    12/12/21 1151 12/17/21 0031 12/26/21 0117  BNP 490.1* 717.5* 343.6*     TSH Recent Labs    12/26/21 1201  TSH 0.959    Lipid Panel     Component Value Date/Time   CHOL 161 09/11/2021 0932   TRIG 130.0 09/11/2021 0932   HDL 66.80 09/11/2021 0932   CHOLHDL 2 09/11/2021 0932   VLDL 26.0 09/11/2021 0932   LDLCALC 68 09/11/2021 0932   LDLCALC 81 09/08/2020 0840   LDLDIRECT 78.3 04/16/2013 0816     Hepatic Function Panel Recent Labs    12/12/21 1351 12/13/21 0837 12/14/21 0310 12/25/21 0114  PROT 6.2* 6.5 6.6 5.7*  ALBUMIN 2.5* 2.4* 2.4* 2.4*  AST 49* 24 19 20   ALT 31 29 21 9   ALKPHOS 92 96 85 71  BILITOT 1.8* 0.9 0.6 0.6  BILIDIR 0.8* 0.3*  --  0.1  IBILI 1.0* 0.6  --  0.5     Imaging: CXR 12/26/2021: Stable low volume chest with bilateral infiltrates.  Telemetry 12/26/2021: HR 110-120 bpm  Cardiac Studies:  EKG 12/25/2021: Afib 104 bpm Occasional PVCs   Echocardiogram 12/13/2021:  1. Left ventricular ejection fraction, by estimation, is 50 to 55%. The  left ventricle has low normal function. The left ventricle has no regional  wall motion abnormalities. Left ventricular diastolic parameters are  consistent with Grade II diastolic  dysfunction (pseudonormalization).   2. Right ventricular systolic function is normal. The right ventricular  size is normal. There is moderately elevated pulmonary artery systolic  pressure. The estimated right ventricular systolic pressure is 81.1 mmHg.   3. A small pericardial effusion is present. The pericardial  effusion is  circumferential.   4. The mitral valve is degenerative. Moderate mitral valve regurgitation.  No evidence of mitral stenosis.   5. The aortic valve is tricuspid. There is mild calcification of the  aortic valve. There is mild thickening of the aortic valve. Aortic valve  regurgitation is trivial. Aortic valve sclerosis/calcification is present,  without any evidence of aortic  stenosis.   6. The inferior vena cava is dilated in size with <50% respiratory  variability, suggesting right atrial pressure of 15 mmHg.   Comparison(s): A prior study was performed on 2010. MR has increased from  2010 report.        Assessment & Recommendations:   87 y.o. Caucasian female  with hypertension, type 2 diabetes mellitus, obesity, admitted on 12/12/2021 with acute hypoxic respiratory failure, UTI.   A. Fib: New diagnosis, now persistent. Baseline respiratory status contributing. Rate better controlled on metoprolol tartrate 25 mg twice daily, diltiazem 100 mg daily. Would avoid amio or TEE/cardioversion given her respiratory status. She is not to keen on having the cardioversion now either.  CHA2DS2VASc score high.  Continue Eliquis 5 mg twice daily.  Does not need aspirin in addition. Consider torsemide 20 mg twice daily as diuretic on discharge.   Diuresis with IV Lasix 20 mg twice daily.   Troponin elevation: Likely secondary to demand ischemia.   We will arrange outpatient follow-up    Nigel Mormon, MD Pager: [phone] Office: [phone]

## 2021-12-28 ENCOUNTER — Telehealth: Payer: Self-pay | Admitting: Family Medicine

## 2021-12-28 DIAGNOSIS — R062 Wheezing: Secondary | ICD-10-CM

## 2021-12-28 DIAGNOSIS — R0602 Shortness of breath: Secondary | ICD-10-CM

## 2021-12-28 NOTE — Telephone Encounter (Signed)
Pt was released from hospital yesterday, and they prescribed her nebulizer solution, without a nebulizer to put the solution into. She is supposed to take this every 6 hours, and will need this as soon as possible. Please advise.    [facility] [place], [address] [place] [address]  Phone:  [phone]  Fax:  [phone]

## 2021-12-28 NOTE — Telephone Encounter (Signed)
Pt stated she checked with [facility], and they do not handle nebulizer so [facility]   [facility] [address], [place], [place] [address] [phone]

## 2021-12-29 NOTE — Telephone Encounter (Signed)
Spoke with pt. Pt advised that I have sent a order to [facility] for a nebulizer. I also gave her the number to [facility].

## 2022-01-02 ENCOUNTER — Telehealth: Payer: Self-pay

## 2022-01-02 NOTE — Telephone Encounter (Signed)
Transition Care Management Unsuccessful Follow-up Telephone Call  Date of discharge and from where:  12/27/2021  [facility]  Attempts:  1st Attempt  Reason for unsuccessful TCM follow-up call:  No answer/busy  Tomasa Rand, RN, BSN, CEN [place] Coordinator [phone]

## 2022-01-02 NOTE — Telephone Encounter (Signed)
Patient is calling stating that she called [facility] and they had no order for her. She would also like for the prescription to be sent to a place that can delivers since she cannot go pick it up. Please advice.

## 2022-01-03 ENCOUNTER — Encounter: Payer: Self-pay | Admitting: Family Medicine

## 2022-01-03 ENCOUNTER — Telehealth: Payer: Self-pay

## 2022-01-03 NOTE — Telephone Encounter (Signed)
Caller Name Elmo Putt, [place] Phone Number Declined to provide Patient Name Katelyn Mahoney Patient DOB 10-Jun-1934 Facility Number [id number] Facility Name [facility] care Call Silt Message Only Reason for Call Symptomatic Patient Initial Comment Caller states he is calling from [facility] care regarding a patient who was discharged last week from the hospital and has had weight gain of 17 lbs.

## 2022-01-03 NOTE — Telephone Encounter (Signed)
Community message sent to [facility] to work on order and patient notified.

## 2022-01-03 NOTE — Telephone Encounter (Signed)
See my chart message

## 2022-01-03 NOTE — Telephone Encounter (Signed)
Nori Riis the occupational therapist called and stated that patient has a 17lb weight gain.  Noticed that she had sent a mychart message this morning about she is retaining fluid.  Please advise.

## 2022-01-03 NOTE — Telephone Encounter (Signed)
Patient notified of note from Dr. Etter Sjogren and that she needs to be seen.  She stated  that has shortness of breath when walking and some chest pain.  Advised that since is having those symptoms that she needs to go to ER to be evaluated.  Patient agreed.

## 2022-01-04 NOTE — Telephone Encounter (Signed)
error 

## 2022-01-05 ENCOUNTER — Telehealth: Payer: Self-pay

## 2022-01-05 ENCOUNTER — Telehealth: Payer: Self-pay | Admitting: Cardiology

## 2022-01-05 ENCOUNTER — Other Ambulatory Visit: Payer: Self-pay | Admitting: Endocrinology

## 2022-01-05 NOTE — Telephone Encounter (Signed)
Ask if she would like to be seen this afternoon

## 2022-01-05 NOTE — Telephone Encounter (Signed)
Agree. Recent hospitalization for pneumonia and fluid overload, also found to be in Afib. Office visit was offered for today. I am concerned regarding worsening respiratory failure. Recommend in personal evaluation, sooner the better.  Thanks MJP

## 2022-01-05 NOTE — Telephone Encounter (Signed)
Patient says she was recently discharged from the hospital on Dec 28th. Says she has gained 20 lbs since, which she assumes is fluid. She is a NP hospital follow up. Can someone call her and speak with her?

## 2022-01-08 ENCOUNTER — Encounter (HOSPITAL_COMMUNITY): Payer: Self-pay | Admitting: Emergency Medicine

## 2022-01-08 ENCOUNTER — Inpatient Hospital Stay (HOSPITAL_COMMUNITY)
Admission: EM | Admit: 2022-01-08 | Discharge: 2022-01-26 | DRG: 291 | Disposition: A | Discharge status: Home Health | Payer: Medicare Other | Attending: Internal Medicine | Admitting: Internal Medicine

## 2022-01-08 ENCOUNTER — Emergency Department (HOSPITAL_COMMUNITY): Payer: Medicare Other

## 2022-01-08 DIAGNOSIS — Z9081 Acquired absence of spleen: Secondary | ICD-10-CM

## 2022-01-08 DIAGNOSIS — I130 Hypertensive heart and chronic kidney disease with heart failure and stage 1 through stage 4 chronic kidney disease, or unspecified chronic kidney disease: Principal | ICD-10-CM | POA: Diagnosis present

## 2022-01-08 DIAGNOSIS — I700 Atherosclerosis of aorta: Secondary | ICD-10-CM | POA: Diagnosis present

## 2022-01-08 DIAGNOSIS — E538 Deficiency of other specified B group vitamins: Secondary | ICD-10-CM | POA: Diagnosis present

## 2022-01-08 DIAGNOSIS — E1142 Type 2 diabetes mellitus with diabetic polyneuropathy: Secondary | ICD-10-CM | POA: Diagnosis present

## 2022-01-08 DIAGNOSIS — K2970 Gastritis, unspecified, without bleeding: Secondary | ICD-10-CM | POA: Diagnosis present

## 2022-01-08 DIAGNOSIS — J9811 Atelectasis: Secondary | ICD-10-CM | POA: Diagnosis present

## 2022-01-08 DIAGNOSIS — J9621 Acute and chronic respiratory failure with hypoxia: Secondary | ICD-10-CM | POA: Diagnosis present

## 2022-01-08 DIAGNOSIS — Z79899 Other long term (current) drug therapy: Secondary | ICD-10-CM

## 2022-01-08 DIAGNOSIS — I4819 Other persistent atrial fibrillation: Secondary | ICD-10-CM | POA: Diagnosis present

## 2022-01-08 DIAGNOSIS — D7589 Other specified diseases of blood and blood-forming organs: Secondary | ICD-10-CM | POA: Diagnosis present

## 2022-01-08 DIAGNOSIS — E876 Hypokalemia: Secondary | ICD-10-CM | POA: Diagnosis not present

## 2022-01-08 DIAGNOSIS — I959 Hypotension, unspecified: Secondary | ICD-10-CM | POA: Diagnosis not present

## 2022-01-08 DIAGNOSIS — K922 Gastrointestinal hemorrhage, unspecified: Secondary | ICD-10-CM

## 2022-01-08 DIAGNOSIS — Z66 Do not resuscitate: Secondary | ICD-10-CM | POA: Diagnosis present

## 2022-01-08 DIAGNOSIS — F419 Anxiety disorder, unspecified: Secondary | ICD-10-CM | POA: Diagnosis present

## 2022-01-08 DIAGNOSIS — Z87440 Personal history of urinary (tract) infections: Secondary | ICD-10-CM

## 2022-01-08 DIAGNOSIS — K5521 Angiodysplasia of colon with hemorrhage: Secondary | ICD-10-CM | POA: Diagnosis present

## 2022-01-08 DIAGNOSIS — I5031 Acute diastolic (congestive) heart failure: Secondary | ICD-10-CM | POA: Diagnosis not present

## 2022-01-08 DIAGNOSIS — L249 Irritant contact dermatitis, unspecified cause: Secondary | ICD-10-CM | POA: Diagnosis present

## 2022-01-08 DIAGNOSIS — R911 Solitary pulmonary nodule: Secondary | ICD-10-CM | POA: Diagnosis present

## 2022-01-08 DIAGNOSIS — N179 Acute kidney failure, unspecified: Secondary | ICD-10-CM | POA: Diagnosis present

## 2022-01-08 DIAGNOSIS — J449 Chronic obstructive pulmonary disease, unspecified: Secondary | ICD-10-CM | POA: Diagnosis present

## 2022-01-08 DIAGNOSIS — E1122 Type 2 diabetes mellitus with diabetic chronic kidney disease: Secondary | ICD-10-CM | POA: Diagnosis present

## 2022-01-08 DIAGNOSIS — F32A Depression, unspecified: Secondary | ICD-10-CM | POA: Diagnosis present

## 2022-01-08 DIAGNOSIS — Z6841 Body Mass Index (BMI) 40.0 and over, adult: Secondary | ICD-10-CM

## 2022-01-08 DIAGNOSIS — I5033 Acute on chronic diastolic (congestive) heart failure: Secondary | ICD-10-CM | POA: Diagnosis present

## 2022-01-08 DIAGNOSIS — R0602 Shortness of breath: Secondary | ICD-10-CM

## 2022-01-08 DIAGNOSIS — Z90710 Acquired absence of both cervix and uterus: Secondary | ICD-10-CM

## 2022-01-08 DIAGNOSIS — K5730 Diverticulosis of large intestine without perforation or abscess without bleeding: Secondary | ICD-10-CM | POA: Diagnosis present

## 2022-01-08 DIAGNOSIS — I509 Heart failure, unspecified: Secondary | ICD-10-CM

## 2022-01-08 DIAGNOSIS — Z7722 Contact with and (suspected) exposure to environmental tobacco smoke (acute) (chronic): Secondary | ICD-10-CM | POA: Diagnosis present

## 2022-01-08 DIAGNOSIS — Z880 Allergy status to penicillin: Secondary | ICD-10-CM

## 2022-01-08 DIAGNOSIS — Z7901 Long term (current) use of anticoagulants: Secondary | ICD-10-CM

## 2022-01-08 DIAGNOSIS — Z888 Allergy status to other drugs, medicaments and biological substances status: Secondary | ICD-10-CM

## 2022-01-08 DIAGNOSIS — Z794 Long term (current) use of insulin: Secondary | ICD-10-CM

## 2022-01-08 DIAGNOSIS — Z91048 Other nonmedicinal substance allergy status: Secondary | ICD-10-CM

## 2022-01-08 DIAGNOSIS — Z20822 Contact with and (suspected) exposure to covid-19: Secondary | ICD-10-CM | POA: Diagnosis present

## 2022-01-08 DIAGNOSIS — D509 Iron deficiency anemia, unspecified: Secondary | ICD-10-CM | POA: Diagnosis present

## 2022-01-08 DIAGNOSIS — Z79890 Hormone replacement therapy (postmenopausal): Secondary | ICD-10-CM

## 2022-01-08 DIAGNOSIS — Z833 Family history of diabetes mellitus: Secondary | ICD-10-CM

## 2022-01-08 DIAGNOSIS — N1832 Chronic kidney disease, stage 3b: Secondary | ICD-10-CM | POA: Diagnosis present

## 2022-01-08 LAB — CBC WITH DIFFERENTIAL/PLATELET
Abs Immature Granulocytes: 0.03 10*3/uL (ref 0.00–0.07)
Basophils Absolute: 0.1 10*3/uL (ref 0.0–0.1)
Basophils Relative: 1 %
Eosinophils Absolute: 0.4 10*3/uL (ref 0.0–0.5)
Eosinophils Relative: 4 %
HCT: 32.4 % — ABNORMAL LOW (ref 36.0–46.0)
Hemoglobin: 9.9 g/dL — ABNORMAL LOW (ref 12.0–15.0)
Immature Granulocytes: 0 %
Lymphocytes Relative: 14 %
Lymphs Abs: 1.7 10*3/uL (ref 0.7–4.0)
MCH: 33 pg (ref 26.0–34.0)
MCHC: 30.6 g/dL (ref 30.0–36.0)
MCV: 108 fL — ABNORMAL HIGH (ref 80.0–100.0)
Monocytes Absolute: 1.4 10*3/uL — ABNORMAL HIGH (ref 0.1–1.0)
Monocytes Relative: 12 %
Neutro Abs: 8 10*3/uL — ABNORMAL HIGH (ref 1.7–7.7)
Neutrophils Relative %: 69 %
Platelets: 311 10*3/uL (ref 150–400)
RBC: 3 MIL/uL — ABNORMAL LOW (ref 3.87–5.11)
RDW: 15.7 % — ABNORMAL HIGH (ref 11.5–15.5)
WBC: 11.5 10*3/uL — ABNORMAL HIGH (ref 4.0–10.5)
nRBC: 1.1 % — ABNORMAL HIGH (ref 0.0–0.2)

## 2022-01-08 LAB — COMPREHENSIVE METABOLIC PANEL
ALT: 10 U/L (ref 0–44)
AST: 22 U/L (ref 15–41)
Albumin: 3 g/dL — ABNORMAL LOW (ref 3.5–5.0)
Alkaline Phosphatase: 67 U/L (ref 38–126)
Anion gap: 9 (ref 5–15)
BUN: 53 mg/dL — ABNORMAL HIGH (ref 8–23)
CO2: 28 mmol/L (ref 22–32)
Calcium: 8.7 mg/dL — ABNORMAL LOW (ref 8.9–10.3)
Chloride: 104 mmol/L (ref 98–111)
Creatinine, Ser: 2.29 mg/dL — ABNORMAL HIGH (ref 0.44–1.00)
GFR, Estimated: 20 mL/min — ABNORMAL LOW (ref >60)
Glucose, Bld: 128 mg/dL — ABNORMAL HIGH (ref 70–99)
Potassium: 3.6 mmol/L (ref 3.5–5.1)
Sodium: 141 mmol/L (ref 135–145)
Total Bilirubin: 1.2 mg/dL (ref 0.3–1.2)
Total Protein: 6.6 g/dL (ref 6.5–8.1)

## 2022-01-08 LAB — TROPONIN I (HIGH SENSITIVITY): Troponin I (High Sensitivity): 16 ng/L (ref <18)

## 2022-01-08 LAB — BRAIN NATRIURETIC PEPTIDE: B Natriuretic Peptide: 376.1 pg/mL — ABNORMAL HIGH (ref 0.0–100.0)

## 2022-01-08 LAB — CBG MONITORING, ED: Glucose-Capillary: 138 mg/dL — ABNORMAL HIGH (ref 70–99)

## 2022-01-08 NOTE — ED Triage Notes (Signed)
Patient BIB [facility] from home for evaluation of shortness of breath, patient reports at least a twenty pound weight gain since she was discharged from hospital on 12/28/2021. Patient also complains of exertional shortness of breath, SpO2 decreases from 100% to 94% with ambulation while wearing her baseline 4L O2 [place]. Patient alert, oriented, and in on apparent distress at this time.

## 2022-01-08 NOTE — ED Provider Triage Note (Signed)
Emergency Medicine Provider Triage Evaluation Note  Katelyn Mahoney , a 88 y.o. female  was evaluated in triage.  Pt complains of shortness of breath.  Shortness of breath has been present since last Wednesday.  Shortness of breath has gotten progressively worse over time.  Shortness of breath is only present with exertion and laying flat.  Denies any associated chest pain.  Patient reports that she has noticed 20 pound weight gain recently.  Patient has increased dose of Lasix at direction of physician however has not had any improvement in weight gain or shortness of breath.   Patient has been using her 4 LPM of O2 via nasal cannula continuously as prescribed.  Review of Systems  Positive: Shortness of breath, orthopnea, nonproductive cough, leg swelling Negative: Chest pain, lightheadedness, dizziness, syncope, fever, chills  Physical Exam  BP 112/65    Pulse (!) 58    Temp 97.8 F (36.6 C) (Oral)    Resp (!) 22    LMP  (LMP Unknown)    SpO2 100%  Gen:   Awake, no distress   Resp:  Normal effort, rales to bilateral lower lobes MSK:   Moves extremities without difficulty; edema with weeping to bilateral lower extremities Other:    Medical Decision Making  Medically screening exam initiated at 5:28 PM.  Appropriate orders placed.  Gyneth Hubka was informed that the remainder of the evaluation will be completed by another provider, this initial triage assessment does not replace that evaluation, and the importance of remaining in the ED until their evaluation is complete.     Loni Beckwith, [place] 01/08/22 1730

## 2022-01-08 NOTE — ED Notes (Signed)
Patient ambulated to bathroom with one assist.

## 2022-01-09 DIAGNOSIS — I5031 Acute diastolic (congestive) heart failure: Secondary | ICD-10-CM | POA: Diagnosis present

## 2022-01-09 DIAGNOSIS — D7589 Other specified diseases of blood and blood-forming organs: Secondary | ICD-10-CM | POA: Diagnosis present

## 2022-01-09 DIAGNOSIS — R911 Solitary pulmonary nodule: Secondary | ICD-10-CM | POA: Diagnosis not present

## 2022-01-09 DIAGNOSIS — I4891 Unspecified atrial fibrillation: Secondary | ICD-10-CM | POA: Diagnosis not present

## 2022-01-09 DIAGNOSIS — Z7901 Long term (current) use of anticoagulants: Secondary | ICD-10-CM | POA: Diagnosis not present

## 2022-01-09 DIAGNOSIS — I130 Hypertensive heart and chronic kidney disease with heart failure and stage 1 through stage 4 chronic kidney disease, or unspecified chronic kidney disease: Secondary | ICD-10-CM | POA: Diagnosis present

## 2022-01-09 DIAGNOSIS — E1142 Type 2 diabetes mellitus with diabetic polyneuropathy: Secondary | ICD-10-CM | POA: Diagnosis not present

## 2022-01-09 DIAGNOSIS — J9811 Atelectasis: Secondary | ICD-10-CM | POA: Diagnosis present

## 2022-01-09 DIAGNOSIS — I4819 Other persistent atrial fibrillation: Secondary | ICD-10-CM | POA: Diagnosis not present

## 2022-01-09 DIAGNOSIS — Q2730 Arteriovenous malformation, site unspecified: Secondary | ICD-10-CM | POA: Diagnosis not present

## 2022-01-09 DIAGNOSIS — Z20822 Contact with and (suspected) exposure to covid-19: Secondary | ICD-10-CM | POA: Diagnosis present

## 2022-01-09 DIAGNOSIS — J449 Chronic obstructive pulmonary disease, unspecified: Secondary | ICD-10-CM | POA: Diagnosis present

## 2022-01-09 DIAGNOSIS — I700 Atherosclerosis of aorta: Secondary | ICD-10-CM | POA: Diagnosis present

## 2022-01-09 DIAGNOSIS — D539 Nutritional anemia, unspecified: Secondary | ICD-10-CM | POA: Diagnosis not present

## 2022-01-09 DIAGNOSIS — I959 Hypotension, unspecified: Secondary | ICD-10-CM | POA: Diagnosis not present

## 2022-01-09 DIAGNOSIS — Z7722 Contact with and (suspected) exposure to environmental tobacco smoke (acute) (chronic): Secondary | ICD-10-CM | POA: Diagnosis present

## 2022-01-09 DIAGNOSIS — N1832 Chronic kidney disease, stage 3b: Secondary | ICD-10-CM | POA: Diagnosis not present

## 2022-01-09 DIAGNOSIS — K5521 Angiodysplasia of colon with hemorrhage: Secondary | ICD-10-CM | POA: Diagnosis present

## 2022-01-09 DIAGNOSIS — Z66 Do not resuscitate: Secondary | ICD-10-CM | POA: Diagnosis present

## 2022-01-09 DIAGNOSIS — K922 Gastrointestinal hemorrhage, unspecified: Secondary | ICD-10-CM | POA: Diagnosis not present

## 2022-01-09 DIAGNOSIS — N179 Acute kidney failure, unspecified: Secondary | ICD-10-CM | POA: Diagnosis not present

## 2022-01-09 DIAGNOSIS — K5730 Diverticulosis of large intestine without perforation or abscess without bleeding: Secondary | ICD-10-CM | POA: Diagnosis present

## 2022-01-09 DIAGNOSIS — S31809A Unspecified open wound of unspecified buttock, initial encounter: Secondary | ICD-10-CM | POA: Diagnosis not present

## 2022-01-09 DIAGNOSIS — E1122 Type 2 diabetes mellitus with diabetic chronic kidney disease: Secondary | ICD-10-CM | POA: Diagnosis not present

## 2022-01-09 DIAGNOSIS — I5033 Acute on chronic diastolic (congestive) heart failure: Secondary | ICD-10-CM | POA: Diagnosis not present

## 2022-01-09 DIAGNOSIS — F32A Depression, unspecified: Secondary | ICD-10-CM | POA: Diagnosis present

## 2022-01-09 DIAGNOSIS — D509 Iron deficiency anemia, unspecified: Secondary | ICD-10-CM | POA: Diagnosis present

## 2022-01-09 DIAGNOSIS — L249 Irritant contact dermatitis, unspecified cause: Secondary | ICD-10-CM | POA: Diagnosis present

## 2022-01-09 DIAGNOSIS — Z6841 Body Mass Index (BMI) 40.0 and over, adult: Secondary | ICD-10-CM | POA: Diagnosis not present

## 2022-01-09 DIAGNOSIS — J9610 Chronic respiratory failure, unspecified whether with hypoxia or hypercapnia: Secondary | ICD-10-CM | POA: Diagnosis not present

## 2022-01-09 DIAGNOSIS — J9621 Acute and chronic respiratory failure with hypoxia: Secondary | ICD-10-CM | POA: Diagnosis present

## 2022-01-09 DIAGNOSIS — I509 Heart failure, unspecified: Secondary | ICD-10-CM

## 2022-01-09 LAB — RESP PANEL BY RT-PCR (FLU A&B, COVID) ARPGX2
Influenza A by PCR: NEGATIVE
Influenza B by PCR: NEGATIVE
SARS Coronavirus 2 by RT PCR: NEGATIVE

## 2022-01-09 LAB — CBG MONITORING, ED: Glucose-Capillary: 114 mg/dL — ABNORMAL HIGH (ref 70–99)

## 2022-01-09 LAB — TROPONIN I (HIGH SENSITIVITY): Troponin I (High Sensitivity): 16 ng/L (ref <18)

## 2022-01-09 LAB — POC OCCULT BLOOD, ED: Fecal Occult Bld: POSITIVE — AB

## 2022-01-09 MED ORDER — METOPROLOL TARTRATE 25 MG PO TABS
25.0000 mg | ORAL_TABLET | Freq: Two times a day (BID) | ORAL | Status: DC
  Administered 2022-01-09 – 2022-01-10 (×3): 25 mg via ORAL
  Filled 2022-01-09 (×3): qty 1

## 2022-01-09 MED ORDER — ACETAMINOPHEN 650 MG RE SUPP: 650.0000 mg | Freq: Four times a day (QID) | RECTAL | Status: DC | PRN

## 2022-01-09 MED ORDER — INSULIN ASPART 100 UNIT/ML IJ SOLN
0.0000 [IU] | Freq: Three times a day (TID) | INTRAMUSCULAR | Status: DC
  Administered 2022-01-10: 3 [IU] via SUBCUTANEOUS
  Administered 2022-01-11: 14:00:00 5 [IU] via SUBCUTANEOUS
  Administered 2022-01-11 (×2): 3 [IU] via SUBCUTANEOUS
  Administered 2022-01-12 – 2022-01-13 (×3): 4 [IU] via SUBCUTANEOUS
  Administered 2022-01-13: 3 [IU] via SUBCUTANEOUS
  Administered 2022-01-14: 7 [IU] via SUBCUTANEOUS
  Administered 2022-01-14 (×2): 4 [IU] via SUBCUTANEOUS
  Administered 2022-01-15: 3 [IU] via SUBCUTANEOUS
  Administered 2022-01-15: 7 [IU] via SUBCUTANEOUS
  Administered 2022-01-15: 3 [IU] via SUBCUTANEOUS
  Administered 2022-01-16: 4 [IU] via SUBCUTANEOUS
  Administered 2022-01-16: 7 [IU] via SUBCUTANEOUS
  Administered 2022-01-16: 4 [IU] via SUBCUTANEOUS
  Administered 2022-01-17: 3 [IU] via SUBCUTANEOUS
  Administered 2022-01-17 (×2): 4 [IU] via SUBCUTANEOUS
  Administered 2022-01-18: 7 [IU] via SUBCUTANEOUS
  Administered 2022-01-18 – 2022-01-19 (×3): 4 [IU] via SUBCUTANEOUS
  Administered 2022-01-19 (×2): 7 [IU] via SUBCUTANEOUS
  Administered 2022-01-20 (×2): 4 [IU] via SUBCUTANEOUS
  Administered 2022-01-20 – 2022-01-21 (×2): 7 [IU] via SUBCUTANEOUS
  Administered 2022-01-21 (×2): 4 [IU] via SUBCUTANEOUS
  Administered 2022-01-22: 7 [IU] via SUBCUTANEOUS
  Administered 2022-01-22: 3 [IU] via SUBCUTANEOUS
  Administered 2022-01-22: 4 [IU] via SUBCUTANEOUS
  Administered 2022-01-23: 13:00:00 7 [IU] via SUBCUTANEOUS
  Administered 2022-01-23 (×2): 4 [IU] via SUBCUTANEOUS
  Administered 2022-01-24: 06:00:00 3 [IU] via SUBCUTANEOUS
  Administered 2022-01-24: 12:00:00 7 [IU] via SUBCUTANEOUS
  Administered 2022-01-24: 18:00:00 4 [IU] via SUBCUTANEOUS
  Administered 2022-01-25 (×3): 3 [IU] via SUBCUTANEOUS
  Administered 2022-01-26: 7 [IU] via SUBCUTANEOUS
  Administered 2022-01-26: 4 [IU] via SUBCUTANEOUS

## 2022-01-09 MED ORDER — DILTIAZEM HCL 30 MG PO TABS
30.0000 mg | ORAL_TABLET | Freq: Four times a day (QID) | ORAL | Status: DC
  Administered 2022-01-09 – 2022-01-26 (×66): 30 mg via ORAL
  Filled 2022-01-09 (×71): qty 1

## 2022-01-09 MED ORDER — SERTRALINE HCL 100 MG PO TABS
100.0000 mg | ORAL_TABLET | Freq: Every day | ORAL | Status: DC
  Administered 2022-01-09 – 2022-01-26 (×18): 100 mg via ORAL
  Filled 2022-01-09 (×18): qty 1

## 2022-01-09 MED ORDER — FUROSEMIDE 10 MG/ML IJ SOLN
80.0000 mg | Freq: Once | INTRAMUSCULAR | Status: AC
  Administered 2022-01-09: 80 mg via INTRAVENOUS
  Filled 2022-01-09: qty 8

## 2022-01-09 MED ORDER — PANTOPRAZOLE SODIUM 40 MG IV SOLR
40.0000 mg | Freq: Two times a day (BID) | INTRAVENOUS | Status: DC
  Administered 2022-01-10 – 2022-01-15 (×11): 40 mg via INTRAVENOUS
  Filled 2022-01-09 (×11): qty 40

## 2022-01-09 MED ORDER — GABAPENTIN 100 MG PO CAPS
100.0000 mg | ORAL_CAPSULE | Freq: Two times a day (BID) | ORAL | Status: DC
  Administered 2022-01-09 – 2022-01-26 (×35): 100 mg via ORAL
  Filled 2022-01-09 (×35): qty 1

## 2022-01-09 MED ORDER — FAMOTIDINE IN NACL 20-0.9 MG/50ML-% IV SOLN
20.0000 mg | Freq: Once | INTRAVENOUS | Status: DC
  Administered 2022-01-09: 20 mg via INTRAVENOUS
  Filled 2022-01-09: qty 50

## 2022-01-09 MED ORDER — INSULIN GLARGINE-YFGN 100 UNIT/ML ~~LOC~~ SOLN
17.0000 [IU] | Freq: Every day | SUBCUTANEOUS | Status: DC
Start: 2022-01-09 — End: 2022-01-22
  Administered 2022-01-10 – 2022-01-21 (×12): 17 [IU] via SUBCUTANEOUS
  Filled 2022-01-09 (×15): qty 0.17

## 2022-01-09 MED ORDER — SODIUM CHLORIDE 0.9% FLUSH
3.0000 mL | Freq: Two times a day (BID) | INTRAVENOUS | Status: DC
  Administered 2022-01-09 – 2022-01-26 (×30): 3 mL via INTRAVENOUS

## 2022-01-09 MED ORDER — ACETAMINOPHEN 325 MG PO TABS
650.0000 mg | ORAL_TABLET | Freq: Four times a day (QID) | ORAL | Status: DC | PRN
  Administered 2022-01-13 – 2022-01-24 (×4): 650 mg via ORAL
  Filled 2022-01-09 (×5): qty 2

## 2022-01-09 NOTE — H&P (Addendum)
Date: 01/09/2022               Patient Name:  Katelyn Mahoney MRN: [id number]  DOB: 05/14/1934 Age / Sex: 88 y.o., female   PCP: Ann Held, DO         Medical Service: Internal Medicine Teaching Service         Attending Physician: Dr. Velna Ochs, MD    First Contact: Mitzie Na, MD Pager: MB [phone]  Second Contact: Gaylan Gerold, DO Pager: Liliane Shi [phone]       After Hours (After 5p/  First Contact Pager: [phone]  weekends / holidays): Second Contact Pager: [phone]   SUBJECTIVE  Chief Complaint: shortness of breath   History of Present Illness: Katelyn Mahoney is a 88 y.o. female with a pertinent PMH of chronic hypoxic respiratory failure on 4L oxygen at home, hypertension, newly diagnosed Atrial fibrillation on Eliquis, CKD3b, HfpEF, and diabetes mellitus who presents to [facility] with progressively worsening shortness of breath.  Katelyn Mahoney 12/13-12/28 for Proteus bacteremia secondary to UTI. She was treated with 7 days of antibiotics with improvement of symptoms. Her [facility] course was complicated by new onset atrial fibrillation with RVR requiring initiation of diltiazem, AKI on CKD3b, and new oxygen requirement up to 3-4L on discharge. Patient reports that she has been doing well at home initially. She was using her 4L oxygen and would be able to ambulate to the bathroom and back with quick improvement in her dyspnea. However, over the past week, she has noted her recovery of dyspnea to be more prolonged and has also noted an almost 20lb weight gain since discharge.  She notes that this is mostly been in the form of bilateral lower extremity swelling.  She contacted her cardiologist's office and was recommended to increase her lasix dosing to 80 mg twice daily. She has been doing this but reports that she has not noticed any significant difference in her weight, lower extremity edema or breathing.  She also endorses orthopnea but  denies any PND.  She denies any lightheadedness or dizziness, chest pain, palpitations, nausea or vomiting, abdominal pain or constipation/diarrhea.  She does endorse melanotic stools but notes that this has been present since her discharge from the [facility].  She denies any NSAID use.  She has been taking her Eliquis twice daily has recommended for her atrial fibrillation.  ED work-up including CBC noted to have acute on chronic microcytic anemia in setting of ongoing melanotic stools for which she was started on Pepcid gtt.  Patient noted to have worsening renal function with serum creatinine up to 2.29, BNP 376, troponin 16.  Patient noted to significantly significantly hypervolemic on examination.  Patient was normotensive, slightly tachycardic to 100-120s, saturating 98-100% on 4 L O2.  She was admitted to internal medicine for further work-up and management.  Medications: No current facility-administered medications on file prior to encounter.   Current Outpatient Medications on File Prior to Encounter  Medication Sig Dispense Refill   acetaminophen (TYLENOL) 650 MG CR tablet 650 mg every 8 (eight) hours.     apixaban (ELIQUIS) 2.5 MG TABS tablet Take 1 tablet (2.5 mg total) by mouth 2 (two) times daily. 60 tablet 0   atorvastatin (LIPITOR) 20 MG tablet TAKE 1/2 TABLET ON MONDAY, WEDNESDAY, FRIDAY AND      SUNDAYS (Patient taking differently: Take 10 mg by mouth See admin instructions. TAKE 1/2 TABLET ON MONDAY, WEDNESDAY, FRIDAY AND  SUNDAYS) 26 tablet 1   Bilberry, Vaccinium myrtillus, (BILBERRY PO) Take 1 tablet by mouth daily.     cyanocobalamin 1000 MCG tablet Take 1 tablet (1,000 mcg total) by mouth daily. 30 tablet 0   diltiazem (CARDIZEM CD) 120 MG 24 hr capsule Take 1 capsule (120 mg total) by mouth daily. 30 capsule 0   fluocinonide cream (LIDEX) 2.62 % Apply 1 application topically daily as needed (breakouts).     furosemide (LASIX) 40 MG tablet TAKE 1 TABLET DAILY (Patient  taking differently: Take 40 mg by mouth daily as needed for fluid.) 90 tablet 1   gabapentin (NEURONTIN) 100 MG capsule TAKE 1 CAPSULE BY MOUTH 3 TIMES DAILY (Patient taking differently: 100 mg 2 (two) times daily.) 90 capsule 3   Glucosamine HCl (GLUCOSAMINE PO) Take 1 tablet by mouth daily.     insulin lispro (HUMALOG KWIKPEN) 100 UNIT/ML KwikPen Inject 6 Units into the skin with breakfast, with lunch, and with evening meal. Inject 7-9 units under the skin three times daily before meals. E11.65 30 mL 0   levalbuterol (XOPENEX) 0.63 MG/3ML nebulizer solution Take 3 mLs (0.63 mg total) by nebulization every 6 (six) hours as needed for wheezing or shortness of breath. 3 mL 12   Loratadine 10 MG CAPS Take 10 mg by mouth daily.     melatonin 3 MG TABS tablet Take 1 tablet (3 mg total) by mouth at bedtime. 30 tablet 0   metoprolol succinate (TOPROL-XL) 100 MG 24 hr tablet Take 0.5 tablets (50 mg total) by mouth daily. Take with or immediately following a meal. 90 tablet 1   multivitamin-lutein (OCUVITE-LUTEIN) CAPS capsule Take 1 capsule by mouth daily. 90 capsule 3   OZEMPIC, 1 MG/DOSE, 4 MG/3ML SOPN INJECT 1MG SUBCUTANEOUSLY  WEEKLY (EVERY 7 DAYS) (Patient taking differently: Inject 1 mg into the skin every Saturday.) 9 mL 1   pantoprazole (PROTONIX) 40 MG tablet Take 1 tablet (40 mg total) by mouth daily. 30 tablet 0   sertraline (ZOLOFT) 100 MG tablet TAKE 1 TABLET DAILY (Patient taking differently: Take 100 mg by mouth daily.) 90 tablet 3   sodium chloride (OCEAN) 0.65 % SOLN nasal spray Place 1 spray into both nostrils as needed for congestion.     Insulin Pen Needle 32G X 4 MM MISC Use on insulin pens 4 times a day 400 each 2   NONFORMULARY OR COMPOUNDED ITEM Lift chair  #1   Dx osteoarthritis knees 1 each 0   OneTouch Delica Lancets 03T MISC Use to test blood sugar once daily- E11.65 100 each 3   ONETOUCH VERIO test strip USE TO TEST BLOOD SUGAR    TWICE A DAY 200 strip 3   TOUJEO SOLOSTAR 300  UNIT/ML Solostar Pen INJECT 34 UNITS            SUBCUTANEOUSLY DAILY (Patient not taking: Reported on 01/09/2022) 13.5 mL 1    Past Medical History:  Past Medical History:  Diagnosis Date   Allergy    Anxiety    Arthritis    Arthritis    Callus    Depression    Diabetes mellitus without complication (HCC)    Hyperlipidemia    Hypertension     Social:  Patient lives at home with daughter and son-in-law. She reports that she is usually alone most of the day; does require some help with iADL's but is able to perform ADLs on her own. She does use a walker to assist with ambulation. No personal  history of tobacco use, alcohol use or illicit drug use. Does report significant second hand smoke exposure.   Family History: Family History  Problem Relation Age of Onset   Cancer Mother    Diabetes Mother    Cancer Father    Diabetes Father    Cancer Sister    Diabetes Sister    Cancer Brother    Brain cancer Brother    Diabetes Brother    Diabetes Maternal Grandmother    Diabetes Paternal Grandmother    Cancer - Lung Brother    Diabetes Brother     Allergies: Allergies as of 01/08/2022 - Review Complete 01/08/2022  Allergen Reaction Noted   Bupropion hcl     Penicillins     Citalopram hydrobromide Other (See Comments)    Metformin and related Diarrhea 08/02/2016   Zinc Other (See Comments) 10/24/2020   Blue dyes (parenteral) Rash 03/01/2020   Cobalt Rash 03/01/2020   Gold-containing drug products Rash 03/01/2020   Nickel Rash 03/01/2020    Review of Systems: A complete ROS was negative except as per HPI.   OBJECTIVE:  Physical Exam: Blood pressure (!) 130/111, pulse (!) 52, temperature 98 F (36.7 C), resp. rate 19, SpO2 100 %. Physical Exam  Constitutional: Chronically ill appearing elderly female, no acute distress HENT: Normocephalic and atraumatic, EOMI, conjunctiva normal, moist mucous membranes Cardiovascular: Tachycardic, irregular rhythm, S1 and S2 present, +  systolic murmur,, no rubs, gallops.  Distal pulses intact Respiratory: Effort normal on 4 L O2, resting comfortably.  No accessory muscle use.  Diffuse crackles, more prominent at bilateral bases. GI: Nondistended, soft, nontender to palpation, normal bowel sounds Musculoskeletal: Normal bulk and tone.  2+ BLE pitting edema to knees Neurological: Is alert and oriented x4, no apparent focal deficits noted. Skin: Warm and dry.  No rash, erythema, lesions noted. Psychiatric: Normal mood and affect. Behavior is normal.   Pertinent Labs: CBC    Component Value Date/Time   WBC 11.5 (H) 01/08/2022 1725   RBC 3.00 (L) 01/08/2022 1725   HGB 9.9 (L) 01/08/2022 1725   HGB 15.5 05/03/2016 1027   HCT 32.4 (L) 01/08/2022 1725   HCT 43.2 05/03/2016 1027   PLT 311 01/08/2022 1725   PLT 322 05/03/2016 1027   MCV 108.0 (H) 01/08/2022 1725   MCV 95 05/03/2016 1027   MCH 33.0 01/08/2022 1725   MCHC 30.6 01/08/2022 1725   RDW 15.7 (H) 01/08/2022 1725   RDW 15.6 05/03/2016 1027   LYMPHSABS 1.7 01/08/2022 1725   LYMPHSABS 2.8 05/03/2016 1027   MONOABS 1.4 (H) 01/08/2022 1725   EOSABS 0.4 01/08/2022 1725   EOSABS 0.5 05/03/2016 1027   BASOSABS 0.1 01/08/2022 1725   BASOSABS 0.1 05/03/2016 1027    CMP     Component Value Date/Time   NA 141 01/08/2022 1725   K 3.6 01/08/2022 1725   CL 104 01/08/2022 1725   CO2 28 01/08/2022 1725   GLUCOSE 128 (H) 01/08/2022 1725   BUN 53 (H) 01/08/2022 1725   CREATININE 2.29 (H) 01/08/2022 1725   CREATININE 1.34 (H) 09/08/2020 0840   CALCIUM 8.7 (L) 01/08/2022 1725   PROT 6.6 01/08/2022 1725   ALBUMIN 3.0 (L) 01/08/2022 1725   AST 22 01/08/2022 1725   ALT 10 01/08/2022 1725   ALKPHOS 67 01/08/2022 1725   BILITOT 1.2 01/08/2022 1725   GFRNONAA 20 (L) 01/08/2022 1725   GFRAA (L) 03/11/2009 0520    51        The  eGFR has been calculated using the MDRD equation. This calculation has not been validated in all clinical situations. eGFR's persistently <60  mL/min signify possible Chronic Kidney Disease.   Pertinent Imaging: DG Chest 2 View  Result Date: 01/08/2022 CLINICAL DATA:  Shortness of breath. EXAM: CHEST - 2 VIEW COMPARISON:  Chest x-ray 12/26/2021. FINDINGS: Heart is enlarged, unchanged. There are small bilateral pleural effusions. There is central pulmonary vascular congestion. There are scattered bilateral interstitial opacities. There are minimal strandy opacities in the lung bases. There is no evidence for pneumothorax or acute fracture. IMPRESSION: 1. Cardiomegaly with mild interstitial edema and small bilateral pleural effusions. 2. Bibasilar opacities may represent edema or infection. Electronically Signed   By: Ronney Asters M.D.   On: 01/08/2022 17:53    EKG: personally reviewed my interpretation is unchanged from previous tracings, atrial fibrillation, early repolarization.  ASSESSMENT & PLAN:  Assessment: Principal Problem:   Acute exacerbation of CHF (congestive heart failure) (HCC)   Brisha Mccabe is a 88 y.o. with pertinent PMH of chronic hypoxic respiratory failure on 4L oxygen at home, hypertension, newly diagnosed Atrial fibrillation on Eliquis, CKD3b, HfpEF, and diabetes mellitus who presented with shortness of breath and admit for acute on chronic HFpEF exacerbation on [facility] day 0  Plan: #Acute on chronic HFpEF exacerbation # Chronic respiratory failure Patient is presenting with several weeks of worsening bilateral lower extremity edema, weight gain and progressively worsening dyspnea on exertion and orthopnea. She has been titrating up her lasix dosing as outpatient; however, patient reports that she has not noted any significant improvement. She has also reported progressively worsening dyspnea on exertion and orthopnea. On examination, patient appears to be significantly hypervolemic with bilateral lower extremity pitting edema and diffuse crackles, worse at bilateral bases. Patient would benefit from IV  diuresis. - IV lasix 26m once - Strict &O and daily weights - Trend BMP  - Unna boots   #Acute on chronic macrocytic anemia #Melena #Vitamin B12 deficiency Patient noted to have Hb 9.9 (baseline ~11 2 wks ago) in setting of melena that patient reports has been present for two weeks. Patient reports one bowel movement daily with her last bowel movement being last night. She denies any loose stools. She is not taking NSAIDs or iron supplements. She was recently started on Eliquis for atrial fibrillation. Per chart review, colonoscopy in 2003 was negative. Patient deferred repeat colonoscopy in 2015 and preferred Cologuard screening instead. Patient was started on Pepcid gtt in the ED. - GI consulted, appreciate recommendations  - IV protonix 420mbid - Will hold Eliquis at this time - Trend CBC, transfuse for Hb <7  #Atrial fibrillation CHADS2VASc score 5. Patient noted to have new onset atrial fibrillation during previous admisison for which cardiology was consulted. Patient was continued on home beta blocker and started on diltiazem. She was also started on Eliquis at the time. However, in setting of suspected GI bleed, holding anticoagulation. She was noted to be tachycardic on presentation with HR up to 120's. However, this was in setting of missed home doses of diltiazem and metoprolol - Diltiazem 3071m6h  - Metoprolol 25m24md  - Cardiac monitoring   #AKI on CKD3b Patient noted to have worsening sCr on admission with sCr up to 2.29 (baseline ~1.5-1.6 2 weeks ago). Suspect this is likely in setting of hypervolemia and expect to improve with diuresis. - Trend renal function - Strict I&O as above - Avoid nephrotoxic agents   #Diabetes mellitus with peripheral neuropathy  Patient is on 34U basal insulin and 6u tid meal time coverage at home. Most recent HbA1c 7.4. Patient reports that she has not eaten much over the past 24 hours. Plan to continue with clear liquid diet in setting of  melena and possible GI evaluation.  - Semglee 17U nightly, uptitrate to 34U once advanced diet - SSI with meals - Resume home gabapentin  - CBG monitoring with meals   #Physical deconditioning Patient reports using a walker to assist with ambulation.  She reports significant dyspnea on exertion.  Suspect that this may be in part due to physical deconditioning.  Patient was previously admitted for SNF on discharge but elected to go home with home health. -PT/OT eval  #Depression - Resume home zoloft   Best Practice: Diet: Cardiac diet and Diabetic diet IVF: Fluids: None, Rate: None VTE: Place TED hose Start: 01/09/22 1332 Code: DNR/DNI AB: None Status: Inpatient with expected length of stay greater than 2 midnights. Anticipated Discharge Location: SNF Barriers to Discharge: Medical stability  Signature: Harvie Heck, MD Internal Medicine Resident, PGY-3 [facility]  Pager: [phone] 3:10 PM, 01/09/2022   Please contact the on call pager after 5 pm and on weekends at [phone].

## 2022-01-09 NOTE — ED Provider Notes (Signed)
[facility] EMERGENCY DEPARTMENT Provider Note   CSN: [id number] Arrival date & time: 01/08/22  1650     History  Chief Complaint  Patient presents with   Shortness of Breath    Katelyn Mahoney is a 88 y.o. female.   Shortness of Breath Associated symptoms: cough   Associated symptoms: no abdominal pain, no chest pain, no fever and no vomiting    Patient presents with shortness of breath x1 week.  Started acutely on Wednesday, is been progressively worsening.  She reports over 1 night she gained 13 pounds.  Her cardiologist told her to double her furosemide and take it twice daily, she has been doing that but despite taking the Lasix she is still gaining weight.  She reports about 20 pounds total weight gain.  The shortness of breath is worse with exertion, having increasing dyspnea.  Additionally having orthopnea at night and no longer able to this lay flat.  She denies any chest pain or abdominal pain.  Patient is on Eliquis, her last dose was yesterday morning.  She is having dark and tarry stool, states it started while she was in the hospital about a week ago.  On 4 L of oxygen at baseline, has not had increase.  Of note, patient was discharged from the hospital on 12/27/2021.  She had new onset atrial fibrillation with started on Eliquis prior to discharge.  Notable medical history includes pulmonary edema, diabetes, hypertension, hyperlipidemia, atrial fibrillation on Eliquis, CKD stage 3b.   Past Medical History:  Diagnosis Date   Allergy    Anxiety    Arthritis    Arthritis    Callus    Depression    Diabetes mellitus without complication ([place])    Hyperlipidemia    Hypertension      Home Medications Prior to Admission medications   Medication Sig Start Date End Date Taking? Authorizing Provider  acetaminophen (TYLENOL) 650 MG CR tablet 650 mg every 8 (eight) hours.    [provider]  apixaban (ELIQUIS) 2.5 MG TABS tablet Take 1  tablet (2.5 mg total) by mouth 2 (two) times daily. 12/27/21 01/26/22  Demaio, Alexa, MD  atorvastatin (LIPITOR) 20 MG tablet TAKE 1/2 TABLET ON MONDAY, WEDNESDAY, [place]      SUNDAYS Patient taking differently: Take 10 mg by mouth See admin instructions. TAKE 1/2 TABLET ON MONDAY, WEDNESDAY, FRIDAY AND      SUNDAYS 08/21/21   Carollee Herter, Alferd Apa, DO  Bilberry, Vaccinium myrtillus, (BILBERRY PO) Take 1 tablet by mouth daily.    [provider]  cyanocobalamin 1000 MCG tablet Take 1 tablet (1,000 mcg total) by mouth daily. 12/27/21 01/26/22  Demaio, Roxine Caddy, MD  diltiazem (CARDIZEM CD) 120 MG 24 hr capsule Take 1 capsule (120 mg total) by mouth daily. 12/27/21 01/26/22  Demaio, Alexa, MD  fluocinonide cream (LIDEX) 1.93 % Apply 1 application topically daily as needed (breakouts).    [provider]  furosemide (LASIX) 40 MG tablet TAKE 1 TABLET DAILY Patient taking differently: Take 40 mg by mouth daily as needed for fluid. 09/18/21   Roma Schanz R, DO  gabapentin (NEURONTIN) 100 MG capsule TAKE 1 CAPSULE BY MOUTH 3 TIMES DAILY Patient taking differently: 100 mg 2 (two) times daily. 06/28/21   Elayne Snare, MD  Glucosamine HCl (GLUCOSAMINE PO) Take 1 tablet by mouth daily.    [provider]  insulin lispro (HUMALOG KWIKPEN) 100 UNIT/ML KwikPen Inject 6 Units into the skin with breakfast,  with lunch, and with evening meal. Inject 7-9 units under the skin three times daily before meals. E11.65 12/27/21   [name], MD  Insulin Pen Needle 32G X 4 MM MISC Use on insulin pens 4 times a day 08/10/21   Elayne Snare, MD  levalbuterol Penne Lash) 0.63 MG/3ML nebulizer solution Take 3 mLs (0.63 mg total) by nebulization every 6 (six) hours as needed for wheezing or shortness of breath. 12/27/21   Demaio, Alexa, MD  Loratadine 10 MG CAPS Take 10 mg by mouth daily.    [provider]  melatonin 3 MG TABS tablet Take 1 tablet (3 mg total) by mouth at bedtime. 12/27/21  01/26/22  Demaio, Roxine Caddy, MD  metoprolol succinate (TOPROL-XL) 100 MG 24 hr tablet Take 0.5 tablets (50 mg total) by mouth daily. Take with or immediately following a meal. 12/27/21   Demaio, Alexa, MD  multivitamin-lutein (OCUVITE-LUTEIN) CAPS capsule Take 1 capsule by mouth daily. 04/07/14   Midge Minium, MD  NONFORMULARY OR COMPOUNDED ITEM Lift chair  #1   Dx osteoarthritis knees 03/09/21   Carollee Herter, Alferd Apa, DO  OneTouch Delica Lancets 79X MISC Use to test blood sugar once daily- E11.65 05/10/21   Elayne Snare, MD  [facility] VERIO test strip USE TO TEST BLOOD SUGAR    TWICE A DAY 11/20/21   Elayne Snare, MD  OZEMPIC, 1 MG/DOSE, 4 MG/3ML SOPN INJECT 1MG  SUBCUTANEOUSLY  WEEKLY (EVERY 7 DAYS) Patient taking differently: Inject 1 mg into the skin once a week. 01/05/22   Elayne Snare, MD  pantoprazole (PROTONIX) 40 MG tablet Take 1 tablet (40 mg total) by mouth daily. 12/27/21 01/26/22  Demaio, Alexa, MD  sertraline (ZOLOFT) 100 MG tablet TAKE 1 TABLET DAILY Patient taking differently: Take 100 mg by mouth daily. 09/11/21   Lowne Chase, Yvonne R, DO  TOUJEO SOLOSTAR 300 UNIT/ML Solostar Pen INJECT 34 UNITS            SUBCUTANEOUSLY DAILY Patient taking differently: 34 Units daily. 10/09/21   Elayne Snare, MD      Allergies    Bupropion hcl, Penicillins, Citalopram hydrobromide, Metformin and related, Zinc, Blue dyes (parenteral), Cobalt, Gold-containing drug products, and Nickel    Review of Systems   Review of Systems  Constitutional:  Positive for unexpected weight change. Negative for fever.  Respiratory:  Positive for cough and shortness of breath.   Cardiovascular:  Positive for leg swelling. Negative for chest pain.  Gastrointestinal:  Positive for blood in stool. Negative for abdominal pain, nausea and vomiting.  Genitourinary:  Positive for decreased urine volume.  Skin:  Negative for wound.  Neurological:  Positive for weakness. Negative for syncope.   Physical Exam Updated Vital  Signs BP 119/67    Pulse (!) 57    Temp 98 F (36.7 C)    Resp 20    LMP  (LMP Unknown)    SpO2 98%  Physical Exam Vitals and nursing note reviewed. Exam conducted with a chaperone present.  Constitutional:      Appearance: Normal appearance. She is obese.  HENT:     Head: Normocephalic and atraumatic.  Eyes:     General: No scleral icterus.       Right eye: No discharge.        Left eye: No discharge.     Extraocular Movements: Extraocular movements intact.     Pupils: Pupils are equal, round, and reactive to light.  Cardiovascular:     Rate and Rhythm: Tachycardia present.  Rhythm irregular.     Pulses: Normal pulses.     Heart sounds: Normal heart sounds. No murmur heard.   No friction rub. No gallop.  Pulmonary:     Effort: Pulmonary effort is normal. No respiratory distress.     Breath sounds: Examination of the right-lower field reveals rales. Examination of the left-lower field reveals rales. Rales present.  Abdominal:     General: Abdomen is flat. Bowel sounds are normal. There is no distension.     Palpations: Abdomen is soft.     Tenderness: There is no abdominal tenderness.  Genitourinary:    Rectum: Normal.     Comments: melenic appearing stool in rectum.  No bright red blood Musculoskeletal:     Right lower leg: Edema present.     Left lower leg: Edema present.     Comments: Bilateral pitting and weeping edema up to the knees.  Skin:    General: Skin is warm and dry.     Coloration: Skin is not jaundiced.     Comments: Decubitus ulcer, no surrounding erythema or purulent material.  Neurological:     Mental Status: She is alert. Mental status is at baseline.     Coordination: Coordination normal.    ED Results / Procedures / Treatments   Labs (all labs ordered are listed, but only abnormal results are displayed) Labs Reviewed  BRAIN NATRIURETIC PEPTIDE - Abnormal; Notable for the following components:      Result Value   B Natriuretic Peptide 376.1 (*)     All other components within normal limits  COMPREHENSIVE METABOLIC PANEL - Abnormal; Notable for the following components:   Glucose, Bld 128 (*)    BUN 53 (*)    Creatinine, Ser 2.29 (*)    Calcium 8.7 (*)    Albumin 3.0 (*)    GFR, Estimated 20 (*)    All other components within normal limits  CBC WITH DIFFERENTIAL/PLATELET - Abnormal; Notable for the following components:   WBC 11.5 (*)    RBC 3.00 (*)    Hemoglobin 9.9 (*)    HCT 32.4 (*)    MCV 108.0 (*)    RDW 15.7 (*)    nRBC 1.1 (*)    Neutro Abs 8.0 (*)    Monocytes Absolute 1.4 (*)    All other components within normal limits  CBG MONITORING, ED - Abnormal; Notable for the following components:   Glucose-Capillary 138 (*)    All other components within normal limits  POC OCCULT BLOOD, ED - Abnormal; Notable for the following components:   Fecal Occult Bld POSITIVE (*)    All other components within normal limits  RESP PANEL BY RT-PCR (FLU A&B, COVID) ARPGX2  TROPONIN I (HIGH SENSITIVITY)  TROPONIN I (HIGH SENSITIVITY)    EKG None  Radiology DG Chest 2 View  Result Date: 01/08/2022 CLINICAL DATA:  Shortness of breath. EXAM: CHEST - 2 VIEW COMPARISON:  Chest x-ray 12/26/2021. FINDINGS: Heart is enlarged, unchanged. There are small bilateral pleural effusions. There is central pulmonary vascular congestion. There are scattered bilateral interstitial opacities. There are minimal strandy opacities in the lung bases. There is no evidence for pneumothorax or acute fracture. IMPRESSION: 1. Cardiomegaly with mild interstitial edema and small bilateral pleural effusions. 2. Bibasilar opacities may represent edema or infection. Electronically Signed   By: Ronney Asters M.D.   On: 01/08/2022 17:53    Procedures Procedures    Medications Ordered in ED Medications  famotidine (PEPCID) IVPB 20  mg premix (20 mg Intravenous New Bag/Given 01/09/22 1305)    ED Course/ Medical Decision Making/ A&P                            Medical Decision Making  88 year old female presenting with orthopnea, shortness of breath, weight gain.   CBC reviewed, compared to 13 days ago there is a 1.5 drop in hemoglobin.  Patient endorses dark tarry stool since discharge from the hospital, she is currently on Eliquis.  FOBT positive, melanotic stool noted on exam.  Will give Pepcid and plan for admission.    Patient is hemodynamically stable, no hypotension and tachycardia secondary to A. fib but no RVR.  Do not feel she needs emergent GI consult at this time.  Patient also has worsening kidney function compared to 13 days ago indicative of an AKI, likely secondary to fluid overload.  I suspect patient will also need diureses given worsening shortness of breath and bilateral lower extremity edema.  Not having any chest pain or abdominal pain, abdominal exam is benign.    Spoke with hospitalist with internal medicine who is agreeable to admission.         Final Clinical Impression(s) / ED Diagnoses Final diagnoses:  Acute diastolic congestive heart failure ([place])  AKI (acute kidney injury) ([place])  Gastrointestinal hemorrhage, unspecified gastrointestinal hemorrhage type    Rx / DC Orders ED Discharge Orders     None         Sherrill Raring, PA-C 01/09/22 1331    Horton, Alvin Critchley, DO 01/09/22 1522

## 2022-01-09 NOTE — Consult Note (Signed)
Reason for Consult:Anemia, Melena Referring Physician: Teaching Service  Katelyn Mahoney HPI: This is an 88 year old female with a PMH of HFpEF, recent worsening of her chronic respiratory failure with an increase in her oxygen to 4L, new diagnosis of afib with initiation of Eliquis 11/2021, CKD, HTN, and DM admitted for complaints of SOB and a 20 lbs weight gain.  The patient reported the weight gain and the bilateral LE edema to her cardiologist.  She was advised to increase her lasix to 80 mg BID, but this did not improve her symptoms.  During this time she reported melenic stools and her HGB was noted to drop from a baseline of 11-12 g/dL down to 9.9 g/dL with an MCV of 108.  Her last visit in the office with Dr. Collene Mares was in 2009 and she was noted to have a macrocytosis at 104, but her HGB was at 14 g/dL.  The last colonoscopy was in 2003 and her last colon screening with Cologuard was 11/17/2014, which was normal.  The patient declined having a repeat colonoscopy. The admission non contrast CT scan of the abdomen/pelvis was negative for any acute abnormalities.  The patient only reports melenic stools in the ER.  Prior to this time she denies any issues with GI bleeding.  Past Medical History:  Diagnosis Date   Allergy    Anxiety    Arthritis    Arthritis    Callus    Depression    Diabetes mellitus without complication ([place])    Hyperlipidemia    Hypertension     Past Surgical History:  Procedure Laterality Date   ABDOMINAL HYSTERECTOMY     CHOLECYSTECTOMY     SPLENECTOMY      Family History  Problem Relation Age of Onset   Cancer Mother    Diabetes Mother    Cancer Father    Diabetes Father    Cancer Sister    Diabetes Sister    Cancer Brother    Brain cancer Brother    Diabetes Brother    Diabetes Maternal Grandmother    Diabetes Paternal Grandmother    Cancer - Lung Brother    Diabetes Brother     Social History:  reports that she has never smoked. She has never  used smokeless tobacco. She reports that she does not drink alcohol and does not use drugs.  Allergies:  Allergies  Allergen Reactions   Bupropion Hcl     Palmar rash   Penicillins     rash   Citalopram Hydrobromide Other (See Comments)    cough   Metformin And Related Diarrhea   Zinc Other (See Comments)    unknown ...   Blue Dyes (Parenteral) Rash   Cobalt Rash   Gold-Containing Drug Products Rash   Nickel Rash    Medications: Scheduled:  diltiazem  30 mg Oral Q6H   gabapentin  100 mg Oral BID   insulin aspart  0-20 Units Subcutaneous TID WC   insulin glargine-yfgn  17 Units Subcutaneous QHS   metoprolol tartrate  25 mg Oral BID   [START ON 01/10/2022] pantoprazole (PROTONIX) IV  40 mg Intravenous Q12H   sertraline  100 mg Oral Daily   sodium chloride flush  3 mL Intravenous Q12H   Continuous:  Results for orders placed or performed during the hospital encounter of 01/08/22 (from the past 24 hour(s))  POC CBG, ED     Status: Abnormal   Collection Time: 01/08/22 10:28 PM  Result Value  Ref Range   Glucose-Capillary 138 (H) 70 - 99 mg/dL   Comment 1 Notify RN    Comment 2 Document in Chart   Troponin I (High Sensitivity)     Status: None   Collection Time: 01/09/22 12:45 AM  Result Value Ref Range   Troponin I (High Sensitivity) 16 <18 ng/L  POC occult blood, ED     Status: Abnormal   Collection Time: 01/09/22 12:24 PM  Result Value Ref Range   Fecal Occult Bld POSITIVE (A) NEGATIVE  CBG monitoring, ED     Status: Abnormal   Collection Time: 01/09/22  4:57 PM  Result Value Ref Range   Glucose-Capillary 114 (H) 70 - 99 mg/dL     DG Chest 2 View  Result Date: 01/08/2022 CLINICAL DATA:  Shortness of breath. EXAM: CHEST - 2 VIEW COMPARISON:  Chest x-ray 12/26/2021. FINDINGS: Heart is enlarged, unchanged. There are small bilateral pleural effusions. There is central pulmonary vascular congestion. There are scattered bilateral interstitial opacities. There are minimal  strandy opacities in the lung bases. There is no evidence for pneumothorax or acute fracture. IMPRESSION: 1. Cardiomegaly with mild interstitial edema and small bilateral pleural effusions. 2. Bibasilar opacities may represent edema or infection. Electronically Signed   By: Ronney Asters M.D.   On: 01/08/2022 17:53    ROS:  As stated above in the HPI otherwise negative.  Blood pressure (!) 121/56, pulse (!) 53, temperature 98 F (36.7 C), resp. rate 15, SpO2 96 %.    PE: Gen: NAD, Alert and Oriented HEENT:  [place]/AT, EOMI Neck: Supple, no LAD Lungs: CTA Bilaterally CV: Irreg, Irreg without M/G/R ABD: Soft, NTND, +BS Ext: No C/C/E  Assessment/Plan: 1) Melenic stools/heme positive. 2) Worsened anemia with macrocytosis. 3) CHF. 4) Chronic respiratory failure.   The patient is saturating at 96-98% with 4 liters of oxygen.  She appears comfortable and she can speak in complete sentences.  She appears stable enough for an EGD, but she is still high risk with all of her comorbidities.  Plan: 1) EGD today, possibly. 2) Continue with pantoprazole.  Kallum Jorgensen D 01/09/2022, 9:01 PM

## 2022-01-10 ENCOUNTER — Encounter (HOSPITAL_COMMUNITY)
Admission: EM | Disposition: A | Discharge status: Home Health | Payer: Self-pay | Source: Home / Self Care | Attending: Internal Medicine

## 2022-01-10 ENCOUNTER — Encounter (HOSPITAL_COMMUNITY): Payer: Self-pay | Admitting: Internal Medicine

## 2022-01-10 ENCOUNTER — Inpatient Hospital Stay (HOSPITAL_COMMUNITY): Payer: Medicare Other | Admitting: Anesthesiology

## 2022-01-10 DIAGNOSIS — N179 Acute kidney failure, unspecified: Secondary | ICD-10-CM | POA: Diagnosis not present

## 2022-01-10 DIAGNOSIS — I5033 Acute on chronic diastolic (congestive) heart failure: Secondary | ICD-10-CM | POA: Diagnosis not present

## 2022-01-10 DIAGNOSIS — K922 Gastrointestinal hemorrhage, unspecified: Secondary | ICD-10-CM | POA: Diagnosis not present

## 2022-01-10 DIAGNOSIS — I4819 Other persistent atrial fibrillation: Secondary | ICD-10-CM

## 2022-01-10 HISTORY — PX: ESOPHAGOGASTRODUODENOSCOPY: SHX5428

## 2022-01-10 HISTORY — PX: HOT HEMOSTASIS: SHX5433

## 2022-01-10 LAB — BASIC METABOLIC PANEL
Anion gap: 9 (ref 5–15)
BUN: 52 mg/dL — ABNORMAL HIGH (ref 8–23)
CO2: 29 mmol/L (ref 22–32)
Calcium: 8.6 mg/dL — ABNORMAL LOW (ref 8.9–10.3)
Chloride: 105 mmol/L (ref 98–111)
Creatinine, Ser: 2.15 mg/dL — ABNORMAL HIGH (ref 0.44–1.00)
GFR, Estimated: 22 mL/min — ABNORMAL LOW (ref >60)
Glucose, Bld: 143 mg/dL — ABNORMAL HIGH (ref 70–99)
Potassium: 3.5 mmol/L (ref 3.5–5.1)
Sodium: 143 mmol/L (ref 135–145)

## 2022-01-10 LAB — IRON AND TIBC
Iron: 40 ug/dL (ref 28–170)
Saturation Ratios: 10 % — ABNORMAL LOW (ref 10.4–31.8)
TIBC: 382 ug/dL (ref 250–450)
UIBC: 342 ug/dL

## 2022-01-10 LAB — CBC
HCT: 29.3 % — ABNORMAL LOW (ref 36.0–46.0)
Hemoglobin: 9 g/dL — ABNORMAL LOW (ref 12.0–15.0)
MCH: 32.8 pg (ref 26.0–34.0)
MCHC: 30.7 g/dL (ref 30.0–36.0)
MCV: 106.9 fL — ABNORMAL HIGH (ref 80.0–100.0)
Platelets: 299 10*3/uL (ref 150–400)
RBC: 2.74 MIL/uL — ABNORMAL LOW (ref 3.87–5.11)
RDW: 15.9 % — ABNORMAL HIGH (ref 11.5–15.5)
WBC: 10.2 10*3/uL (ref 4.0–10.5)
nRBC: 0.9 % — ABNORMAL HIGH (ref 0.0–0.2)

## 2022-01-10 LAB — GLUCOSE, CAPILLARY
Glucose-Capillary: 110 mg/dL — ABNORMAL HIGH (ref 70–99)
Glucose-Capillary: 112 mg/dL — ABNORMAL HIGH (ref 70–99)
Glucose-Capillary: 121 mg/dL — ABNORMAL HIGH (ref 70–99)
Glucose-Capillary: 169 mg/dL — ABNORMAL HIGH (ref 70–99)
Glucose-Capillary: 226 mg/dL — ABNORMAL HIGH (ref 70–99)

## 2022-01-10 LAB — FERRITIN: Ferritin: 53 ng/mL (ref 11–307)

## 2022-01-10 SURGERY — EGD (ESOPHAGOGASTRODUODENOSCOPY)
Anesthesia: Monitor Anesthesia Care

## 2022-01-10 MED ORDER — PROPOFOL 500 MG/50ML IV EMUL
INTRAVENOUS | Status: DC | PRN
  Administered 2022-01-10: 100 ug/kg/min via INTRAVENOUS

## 2022-01-10 MED ORDER — PHENYLEPHRINE 40 MCG/ML (10ML) SYRINGE FOR IV PUSH (FOR BLOOD PRESSURE SUPPORT)
PREFILLED_SYRINGE | INTRAVENOUS | Status: DC | PRN
  Administered 2022-01-10: 80 ug via INTRAVENOUS
  Administered 2022-01-10: 200 ug via INTRAVENOUS

## 2022-01-10 MED ORDER — COLLAGENASE 250 UNIT/GM EX OINT
TOPICAL_OINTMENT | Freq: Every day | CUTANEOUS | Status: DC
  Administered 2022-01-14: 1 via TOPICAL
  Filled 2022-01-10: qty 30

## 2022-01-10 MED ORDER — FUROSEMIDE 10 MG/ML IJ SOLN
80.0000 mg | Freq: Two times a day (BID) | INTRAMUSCULAR | Status: DC
  Administered 2022-01-10 – 2022-01-14 (×10): 80 mg via INTRAVENOUS
  Filled 2022-01-10 (×10): qty 8

## 2022-01-10 MED ORDER — PEG 3350-KCL-NA BICARB-NACL 420 G PO SOLR
4000.0000 mL | Freq: Once | ORAL | Status: AC
  Administered 2022-01-11: 4000 mL via ORAL
  Filled 2022-01-10: qty 4000

## 2022-01-10 MED ORDER — SODIUM CHLORIDE 0.9 % IV SOLN: INTRAVENOUS | Status: DC

## 2022-01-10 MED ORDER — SODIUM CHLORIDE 0.9 % IV SOLN: INTRAVENOUS | Status: DC | PRN

## 2022-01-10 NOTE — Transfer of Care (Signed)
Immediate Anesthesia Transfer of Care Note  Patient: Katelyn Mahoney  Procedure(s) Performed: ESOPHAGOGASTRODUODENOSCOPY (EGD) HOT HEMOSTASIS (ARGON PLASMA COAGULATION/BICAP)  Patient Location: PACU  Anesthesia Type:MAC  Level of Consciousness: awake, alert  and oriented  Airway & Oxygen Therapy: Patient Spontanous Breathing and Patient connected to face mask oxygen  Post-op Assessment: Report given to RN and Post -op Vital signs reviewed and stable  Post vital signs: Reviewed and stable  Last Vitals:  Vitals Value Taken Time  BP 104/58 01/10/22 1259  Temp 36.9 C 01/10/22 1300  Pulse 57 01/10/22 1323  Resp 16 01/10/22 1323  SpO2 98 % 01/10/22 1323  Vitals shown include unvalidated device data.  Last Pain:  Vitals:   01/10/22 1300  TempSrc:   PainSc: 0-No pain         Complications: No notable events documented.

## 2022-01-10 NOTE — TOC Progression Note (Signed)
Transition of Care [facility]) - Progression Note    Patient Details  Name: Katelyn Mahoney MRN: [id number] Date of Birth: July 14, 1934  Transition of Care [facility]) CM/SW Contact  Zenon Mayo, RN Phone Number: 01/10/2022, 10:25 AM  Clinical Narrative:     Transition of Care [facility]) Screening Note   Patient Details  Name: Katelyn Mahoney Date of Birth: 1934-11-29   Transition of Care [facility]) CM/SW Contact:    Zenon Mayo, RN Phone Number: 01/10/2022, 10:25 AM    Transition of Care Department [facility]) has reviewed patient and no TOC needs have been identified at this time. We will continue to monitor patient advancement through interdisciplinary progression rounds. If new patient transition needs arise, please place a TOC consult.          Expected Discharge Plan and Services                                                 Social Determinants of Health (SDOH) Interventions    Readmission Risk Interventions No flowsheet data found.

## 2022-01-10 NOTE — Anesthesia Preprocedure Evaluation (Addendum)
Anesthesia Evaluation  Patient identified by MRN, date of birth, ID band Patient awake    Reviewed: Allergy & Precautions, NPO status , Patient's Chart, lab work & pertinent test results  Airway Mallampati: II  TM Distance: >3 FB Neck ROM: Full    Dental   Pulmonary neg pulmonary ROS,    breath sounds clear to auscultation       Cardiovascular hypertension, Pt. on medications and Pt. on home beta blockers +CHF  + Valvular Problems/Murmurs MR  Rhythm:Regular Rate:Normal     Neuro/Psych negative neurological ROS     GI/Hepatic negative GI ROS, Neg liver ROS,   Endo/Other  diabetes, Type 2, Insulin Dependent  Renal/GU CRFRenal disease     Musculoskeletal  (+) Arthritis ,   Abdominal   Peds  Hematology  (+) anemia ,   Anesthesia Other Findings   Reproductive/Obstetrics                             Anesthesia Physical Anesthesia Plan  ASA: 3  Anesthesia Plan: MAC   Post-op Pain Management: Minimal or no pain anticipated   Induction:   PONV Risk Score and Plan: 2 and Propofol infusion and Ondansetron  Airway Management Planned: Natural Airway and Nasal Cannula  Additional Equipment:   Intra-op Plan:   Post-operative Plan:   Informed Consent: I have reviewed the patients History and Physical, chart, labs and discussed the procedure including the risks, benefits and alternatives for the proposed anesthesia with the patient or authorized representative who has indicated his/her understanding and acceptance.   Patient has DNR.  Discussed DNR with patient and Suspend DNR.     Plan Discussed with:   Anesthesia Plan Comments:        Anesthesia Quick Evaluation

## 2022-01-10 NOTE — Progress Notes (Signed)
Orthopedic Tech Progress Note Patient Details:  Katelyn Mahoney 1934-03-24 [id number]  Patient ID: [name], female   DOB: Aug 12, 1934, 88 y.o.   MRN: [id number] Spoke with RN and let her know we apply unna boots on Monday/Thursdays, told her we could apply today as long as the boots do not get removed tomorrow and then they would be changed again on Monday.   [name] 01/10/2022, 10:17 AM

## 2022-01-10 NOTE — Op Note (Signed)
[facility] Patient Name: Katelyn Mahoney Procedure Date : 01/10/2022 MRN: [id number] Attending MD: Carol Ada , MD Date of Birth: [birth year] CSN: [id number] Age: 88 Admit Type: Inpatient Procedure:                Upper GI endoscopy Indications:              Heme positive stool, Melena Providers:                Carol Ada, MD, Grace Isaac, RN, Frazier Richards,                            Technician, Viann Fish, CRNA Referring MD:              Medicines:                Propofol per Anesthesia Complications:            No immediate complications. Estimated Blood Loss:     Estimated blood loss: none. Procedure:                Pre-Anesthesia Assessment:                           - Prior to the procedure, a History and Physical                            was performed, and patient medications and                            allergies were reviewed. The patient's tolerance of                            previous anesthesia was also reviewed. The risks                            and benefits of the procedure and the sedation                            options and risks were discussed with the patient.                            All questions were answered, and informed consent                            was obtained. Prior Anticoagulants: The patient has                            taken no previous anticoagulant or antiplatelet                            agents. ASA Grade Assessment: III - A patient with                            severe systemic disease. After reviewing the risks  and benefits, the patient was deemed in                            satisfactory condition to undergo the procedure.                           - Sedation was administered by an anesthesia                            professional. Deep sedation was attained.                           After obtaining informed consent, the endoscope was                            passed under  direct vision. Throughout the                            procedure, the patient's blood pressure, pulse, and                            oxygen saturations were monitored continuously. The                            [id number]-H190 ([id number] Olympus endoscope was introduced                            through the mouth, and advanced to the second part                            of duodenum. The upper GI endoscopy was                            accomplished without difficulty. The patient                            tolerated the procedure well. Scope In: Scope Out: Findings:      The esophagus was normal.      Segmental mild inflammation characterized by erythema was found in the       gastric fundus and in the gastric body.      A single small angiodysplastic lesion without bleeding was found in the       second portion of the duodenum. Coagulation for tissue destruction using       monopolar probe was successful.      Linear stripes of erythema were noted in the gastric body and fundus.       This was not the source of bleeding. A nonbleeding AVM was found in the       second portion of the duodenal bulb. APC was applied and the lesion was       ablated.      The patient tolerated the procedure well, but blood pressure support was       required as her systolic BP dropped down to 60 mmHg. Impression:               -  Normal esophagus.                           - Gastritis.                           - A single non-bleeding angiodysplastic lesion in                            the duodenum. Treated with a monopolar probe.                           - No specimens collected. Recommendation:           - Patient has a contact number available for                            emergencies. The signs and symptoms of potential                            delayed complications were discussed with the                            patient. Return to normal activities tomorrow.                            Written  discharge instructions were provided to the                            patient.                           - Resume previous diet.                           - Continue present medications.                           - ? Colonoscopy. This will be discussed with the                            patient. Procedure Code(s):        --- Professional ---                           [phone], Esophagogastroduodenoscopy, flexible,                            transoral; with ablation of tumor(s), polyp(s), or                            other lesion(s) (includes pre- and post-dilation                            and guide wire passage, when performed) Diagnosis Code(s):        --- Professional ---  K31.819, Angiodysplasia of stomach and duodenum                            without bleeding                           K29.70, Gastritis, unspecified, without bleeding                           R19.5, Other fecal abnormalities                           K92.1, Melena (includes Hematochezia) CPT copyright 2019 American Medical Association. All rights reserved. The codes documented in this report are preliminary and upon coder review may  be revised to meet current compliance requirements. Carol Ada, MD Carol Ada, MD 01/10/2022 12:22:13 PM This report has been signed electronically. Number of Addenda: 0

## 2022-01-10 NOTE — Anesthesia Postprocedure Evaluation (Signed)
Anesthesia Post Note  Patient: Katelyn Mahoney  Procedure(s) Performed: ESOPHAGOGASTRODUODENOSCOPY (EGD) HOT HEMOSTASIS (ARGON PLASMA COAGULATION/BICAP)     Patient location during evaluation: PACU Anesthesia Type: MAC Level of consciousness: awake and alert Pain management: pain level controlled Vital Signs Assessment: post-procedure vital signs reviewed and stable Respiratory status: spontaneous breathing, nonlabored ventilation, respiratory function stable and patient connected to nasal cannula oxygen Cardiovascular status: stable and blood pressure returned to baseline Postop Assessment: no apparent nausea or vomiting Anesthetic complications: no   No notable events documented.  Last Vitals:  Vitals:   01/10/22 1300 01/10/22 1326  BP:  106/67  Pulse:  (!) 52  Resp:  16  Temp: 36.9 C 36.5 C  SpO2:  99%    Last Pain:  Vitals:   01/10/22 1326  TempSrc: Oral  PainSc:                  [name]

## 2022-01-10 NOTE — Progress Notes (Signed)
I spoke with the patient about a colonoscopy.  She is agreeable to the procedure and she is scheduled for 12:30 PM on Friday 01/12/2022.

## 2022-01-10 NOTE — Anesthesia Procedure Notes (Signed)
Procedure Name: MAC Date/Time: 01/10/2022 12:05 PM Performed by: Griffin Dakin, CRNA Pre-anesthesia Checklist: Patient identified, Emergency Drugs available, Suction available, Patient being monitored and Timeout performed Patient Re-evaluated:Patient Re-evaluated prior to induction Oxygen Delivery Method: Simple face mask Induction Type: IV induction Placement Confirmation: positive ETCO2 and breath sounds checked- equal and bilateral Dental Injury: Teeth and Oropharynx as per pre-operative assessment

## 2022-01-10 NOTE — Consult Note (Addendum)
[place] Nurse Consult Note: Patient receiving care in [place] Reason for Consult: Buttock wound Wound type: MASD/ITD that is evolving now has an open wound in the intergluteal cleft that is 100% yellow and measures 1 cm x 0.3 cm  Pressure Injury POA: Yes/No/NA Drainage (amount, consistency, odor) Sanguinous   Periwound: erythema Dressing procedure/placement/frequency: Cleanse the entire buttock/sacral area with soap and water, rinse and dry. Apply a nickel thick layer of Santyl on the intergluteal cleft wound, cover with moistened saline gauze, dry gauze and sacral foam dressing. Change daily. Turn every 2 hours. Change to an air mattress.  ICD-10 CM Codes for Irritant Dermatitis L24A0 - Due to friction or contact with body fluids; unspecified  Monitor the wound area(s) for worsening of condition such as: Signs/symptoms of infection, increase in size, development of or worsening of odor, development of pain, or increased pain at the affected locations.   Notify the medical team if any of these develop.  Thank you for the consult. [place] nurse will not follow at this time.   Please re-consult the [place] team if needed.  Cathlean Marseilles Tamala Julian, MSN, RN, [place], Lysle Pearl, [facility] Wound Treatment Associate Pager [phone]

## 2022-01-10 NOTE — Progress Notes (Signed)
Subjective:   Patient seen at bedside this AM.  She states that she has been taking her medications at home, including diltiazem, metoprolol, and Eliquis. The swelling in her legs is worse than normal, improved a little bit from yesterday. Her breathing is ok when she is at rest, but when moving she is not comfortable.  She continues to endorse orthopnea.  No further episodes of dark stools since yesterday.  No other complaints or concerns today.  Objective:  Vital signs in last 24 hours: Vitals:   01/09/22 2150 01/09/22 2158 01/10/22 0021 01/10/22 0400  BP: 135/67  (!) 144/77 (!) 116/92  Pulse: (!) 54 (!) 107 96 90  Resp: 20  18 (!) 21  Temp: 97.8 F (36.6 C)  (!) 97.5 F (36.4 C) 98.5 F (36.9 C)  TempSrc: Oral  Oral Oral  SpO2: 98%  98% 98%  Weight:   (!) 136.6 kg   Height:       Constitutional: Chronically ill appearing elderly female, no acute distress HENT: Normocephalic and atraumatic, EOMI, conjunctivae are normal, moist mucous membranes Cardiovascular: Tachycardic, irregular rhythm, S1 and S2 present, + systolic murmur, no rubs, gallops. Respiratory: Effort normal on 4 L O2, resting comfortably.  No accessory muscle use.  Diffuse crackles, more prominent at bilateral bases. GI: Nondistended, soft, nontender to palpation, normal bowel sounds Musculoskeletal: Normal bulk and tone.  2+ BLE pitting edema to knees, unchanged from prior. Neurological: Is alert and oriented x4, no apparent focal deficits noted. Skin: Warm and dry. No rash, erythema, lesions noted. Psychiatric: Normal mood and affect. Behavior is normal.   Assessment/Plan:  Principal Problem:   Acute exacerbation of CHF (congestive heart failure) (HCC)  #Acute on chronic HFpEF exacerbation #Chronic respiratory failure, on 3-4 L at home Patient was admitted to our service with acute on chronic HFpEF exacerbation.  On admit, the patient was noted to be tachycardic and in atrial fibrillation.  It is likely  that her new A. fib could have led to this exacerbation.  So far, she has received 1 x 80 mg Lasix.  No output is recorded, however her weight is the same as yesterday.  On examination today, the patient continues to appear significantly hypervolemic with bilateral lower extremity pitting edema and diffuse crackles, worse at bilateral bases.  At this time, will start scheduled IV Lasix 80 mg twice daily and continue to monitor her volume status. - IV lasix 80mg  BID - Strict I&O'd and daily weights - Trend BMP  - Unna boots   #Acute on chronic macrocytic anemia #Melena #Vitamin B12 deficiency Patient noted to have Hb 9.0 (baseline ~11 2 wks ago) in setting of melena that the patient reports has been present for two weeks. The patient continues to be hemodynamically stable and denies melena overnight. She is not taking NSAIDs or iron supplements. FOBT positive in the ED. GI team was consulted, and they recommend continued Protonix and are considering an endoscopy this admission.  However, will need to optimize her volume status first. - GI consulted, appreciate recommendations  - IV protonix 40mg  bid - Holding Eliquis - Trend CBC, transfuse for Hb <7   #Atrial fibrillation Patient noted to have new onset atrial fibrillation during her previous admisison for which cardiology was consulted.  She reports that she has been compliant with her diltiazem, Eliquis, and metoprolol at home.  She was noted to be in A. fib on telemetry today.  We will continue diltiazem and metoprolol but are holding Eliquis  in the setting of suspected GI bleed. - Diltiazem 30mg  q6h  - Metoprolol 25mg  bid  - Holding Eliquis - Cardiac monitoring    #AKI on CKD3b Patient noted to have worsening sCr on admission with sCr up to 2.29 (baseline ~1.5-1.6 2 weeks ago).  Today, creatinine has only slightly improved to 2.15.  Suspect this is likely in the setting of hypervolemia and expect to improve with diuresis.  Bladder scan  showed 0 mL. - Trend renal function - Strict I&O as above - Avoid nephrotoxic agents    #Diabetes mellitus with peripheral neuropathy  Patient is on 34U basal insulin and 6u tid meal time coverage at home. Most recent HbA1c 7.4.  - Semglee 17U nightly, uptitrate to 34U once advanced diet - SSI with meals - Continue home gabapentin  - CBG monitoring   #Physical deconditioning Patient reports using a walker to assist with ambulation.  She reports significant dyspnea on exertion.  Suspect that this may be in part due to physical deconditioning.  Patient was previously admitted for SNF on discharge but elected to go home with home health. -PT/OT eval   Prior to Admission Living Arrangement: Home Anticipated Discharge Location: SNF Dispo: Anticipated discharge pending medical work-up and stability  Orvis Brill, MD 01/10/2022, 6:40 AM Pager: [phone]  After 5pm on weekdays and 1pm on weekends: On Call pager [phone]

## 2022-01-10 NOTE — Evaluation (Signed)
Occupational Therapy Evaluation Patient Details Name: Katelyn Mahoney MRN: [id number] DOB: Dec 20, 1934 Today's Date: 01/10/2022   History of Present Illness Pt is an 88 y.o. female who presented 01/08/22 with SOB and weight gain. Pt admitted for acute on chronic HFpEF exacerbation. Pt also with melenic stools/heme positive, plan for EGD 01/10/22. PMH includes DM II, HTN, obesity, CKD, HLD, chronic LE edema, chronic hypoxic respiratory failure on 4L oxygen at home, Afib, HfpEF   Clinical Impression   Pt admitted with the above diagnoses and presents with below problem list. Pt will benefit from continued acute OT to address the below listed deficits and maximize independence with basic ADLs prior to d/c to venue below. At baseline, pt reports she is mod I with basic ADLs, drives, meets with friends once a week at a restaurant. Pt presents with generalized weakness and decreased activity tolerance. Pt currently needs up to min A with UB ADLs, mod A with LB ADLs, min A with bed mobility, min guard assist with functional transfers. Pt 's daughter lives with her, works during the day. If pt is able to arrange assist with bathing/dressing could potentially d/c home otherwise recommend ST SNF for continued rehab.       Recommendations for follow up therapy are one component of a multi-disciplinary discharge planning process, led by the attending physician.  Recommendations may be updated based on patient status, additional functional criteria and insurance authorization.   Follow Up Recommendations  Skilled nursing-short term rehab (<3 hours/day)    Assistance Recommended at Discharge Frequent or constant Supervision/Assistance  Patient can return home with the following A little help with walking and/or transfers;A little help with bathing/dressing/bathroom;Assistance with cooking/housework    Functional Status Assessment  Patient has had a recent decline in their functional status and demonstrates the  ability to make significant improvements in function in a reasonable and predictable amount of time.  Equipment Recommendations  None recommended by OT    Recommendations for Other Services       Precautions / Restrictions Precautions Precautions: Fall;Other (comment) Precaution Comments: monitor SpO2, 4L O2 at baseline Restrictions Weight Bearing Restrictions: No      Mobility Bed Mobility Overal bed mobility: Needs Assistance Bed Mobility: Supine to Sit;Sit to Supine     Supine to sit: Min assist;HOB elevated Sit to supine: Min assist;HOB elevated   General bed mobility comments: assist to powerup trunk. assist to advance BLE back onto bed    Transfers Overall transfer level: Needs assistance Equipment used: Rolling walker (2 wheels) Transfers: Sit to/from Stand Sit to Stand: Min guard           General transfer comment: pt declined standing with OT, recently worked with PT. Per chart review and conversation with pt she is currently min guard to stand with walker      Balance Overall balance assessment: Needs assistance Sitting-balance support: No upper extremity supported;Feet supported Sitting balance-Leahy Scale: Fair Sitting balance - Comments: Static sitting EOB with supervision for safety.   Standing balance support: Reliant on assistive device for balance Standing balance-Leahy Scale: Poor Standing balance comment: rollator at baseline                           ADL either performed or assessed with clinical judgement   ADL Overall ADL's : Needs assistance/impaired Eating/Feeding: Set up   Grooming: Minimal assistance;Set up;Sitting   Upper Body Bathing: Minimal assistance;Set up;Sitting   Lower Body Bathing: Moderate assistance;Sit  to/from stand   Upper Body Dressing : Set up;Sitting;Minimal assistance   Lower Body Dressing: Moderate assistance;Sitting/lateral leans;Sit to/from stand   Toilet Transfer: Min guard;Ambulation;Rolling  walker (2 wheels)   Toileting- Clothing Manipulation and Hygiene: Moderate assistance;Sit to/from stand;Minimal assistance         General ADL Comments: Pt reporting BUE fatigue/pain with minimal activity (s/p walking length of room using BUE on walker). Generalized weakness and decreased activity tolerance impacting ADLs. DOE 2/4     Vision Baseline Vision/History: 1 Wears glasses       Perception     Praxis      Pertinent Vitals/Pain Pain Assessment: Faces Faces Pain Scale: Hurts a little bit Pain Location: legs and arms with movement Pain Descriptors / Indicators: Discomfort;Grimacing;Guarding Pain Intervention(s): Monitored during session;Repositioned;Limited activity within patient's tolerance     Hand Dominance Right   Extremity/Trunk Assessment Upper Extremity Assessment Upper Extremity Assessment: Generalized weakness (c/o BUE pain/fatigue after walking length of room using rw)   Lower Extremity Assessment Lower Extremity Assessment: Defer to PT evaluation;Generalized weakness   Cervical / Trunk Assessment Cervical / Trunk Assessment: Other exceptions Cervical / Trunk Exceptions: increased body habitus   Communication Communication Communication: No difficulties   Cognition Arousal/Alertness: Awake/alert Behavior During Therapy: WFL for tasks assessed/performed Overall Cognitive Status: Within Functional Limits for tasks assessed                                       General Comments  Pt on 4L O2 throughout session with SpO2>92 to complete bed mobility and sit EOB several minutes. DOE 2/4    Exercises     Shoulder Instructions      Home Living Family/patient expects to be discharged to:: Private residence Living Arrangements: Children Available Help at Discharge: Family;Available PRN/intermittently Type of Home: House Home Access: Stairs to enter Entrance Stairs-Number of Steps: 3 Entrance Stairs-Rails: None (rail on door  itself) Home Layout: One level     Bathroom Shower/Tub: Walk-in shower;Sponge bathes at baseline (has started to sponge-bathe recently but wants to get back to showering)   Bathroom Toilet: Handicapped height     Home Equipment: [place] - single point;Rollator (4 wheels);BSC/3in1;Cane - quad   Additional Comments: Uses 4L supplemental O2 at baseline. daughter lives with her and works during the day.      Prior Functioning/Environment Prior Level of Function : Independent/Modified Independent             Mobility Comments: Uses rollator primarily. Reports only being able to ambulate short distances in the home before fatiguing recnetly, even needing to sit to rest 1x before reaching proximal bathroom in home. Reports she was previously able to go further before SOB worsened. Pt drives at baseline, meets up with friends once a week to eat out.          OT Problem List: Decreased strength;Decreased activity tolerance;Impaired balance (sitting and/or standing);Decreased knowledge of use of DME or AE;Cardiopulmonary status limiting activity;Obesity;Decreased knowledge of precautions;Pain;Impaired UE functional use;Increased edema      OT Treatment/Interventions: Self-care/ADL training;Therapeutic exercise;Energy conservation;DME and/or AE instruction;Therapeutic activities;Patient/family education;Balance training    OT Goals(Current goals can be found in the care plan section) Acute Rehab OT Goals Patient Stated Goal: regain prior level of activity, mod I with basic ADLs OT Goal Formulation: With patient Time For Goal Achievement: 01/24/22 Potential to Achieve Goals: Good ADL Goals Pt Will Perform Grooming:  with set-up;sitting Pt Will Perform Upper Body Dressing: with set-up;sitting Pt Will Perform Lower Body Dressing: with modified independence;sit to/from stand Pt Will Transfer to Toilet: with modified independence;ambulating Pt Will Perform Toileting - Clothing Manipulation and  hygiene: with modified independence;sit to/from stand  OT Frequency: Min 2X/week    Co-evaluation              AM-PAC OT "6 Clicks" Daily Activity     Outcome Measure Help from another person eating meals?: None Help from another person taking care of personal grooming?: A Little Help from another person toileting, which includes using toliet, bedpan, or urinal?: A Little Help from another person bathing (including washing, rinsing, drying)?: A Lot Help from another person to put on and taking off regular upper body clothing?: A Little Help from another person to put on and taking off regular lower body clothing?: A Lot 6 Click Score: 17   End of Session Equipment Utilized During Treatment: Oxygen;Rolling walker (2 wheels) (4L)  Activity Tolerance: Patient limited by fatigue Patient left: in bed;with call bell/phone within reach;Other (comment) (with transport services to go to procedure)  OT Visit Diagnosis: Unsteadiness on feet (R26.81);Muscle weakness (generalized) (M62.81);Pain                Time: 7262-0355 OT Time Calculation (min): 22 min Charges:  OT General Charges $OT Visit: 1 Visit OT Evaluation $OT Eval Moderate Complexity: [place], OT Acute Rehabilitation Services Office: [phone]   Hortencia Pilar 01/10/2022, 11:22 AM

## 2022-01-10 NOTE — Evaluation (Signed)
Physical Therapy Evaluation Patient Details Name: Katelyn Mahoney MRN: [id number] DOB: 1934-09-21 Today's Date: 01/10/2022  History of Present Illness  Pt is an 88 y.o. female who presented 01/08/22 with SOB and weight gain. Pt admitted for acute on chronic HFpEF exacerbation. Pt also with melenic stools/heme positive, plan for EGD 01/10/22. PMH includes DM II, HTN, obesity, CKD, HLD, chronic LE edema, chronic hypoxic respiratory failure on 4L oxygen at home, Afib, HfpEF   Clinical Impression  Pt presents with condition above and deficits mentioned below, see PT Problem List. PTA, she was mod I using a rollator for short distance mobility within the home. Pt's daughter (who works) lives with her in a 1-level house with 3 STE. Currently, pt displays deficits in aerobic endurance/activity tolerance, gross lower extremity strength, and balance that place her at risk for falls. Pt is requiring minA to transition sit > supine, but otherwise is able to perform functional mobility with a RW within her room at a min guard assist level without LOB. She fatigues quickly and needs to rest often. Pt would benefit from follow-up with HHPT to maximize her independence and safety with all functional mobility. Will continue to follow acutely.     Recommendations for follow up therapy are one component of a multi-disciplinary discharge planning process, led by the attending physician.  Recommendations may be updated based on patient status, additional functional criteria and insurance authorization.  Follow Up Recommendations Home health PT    Assistance Recommended at Discharge Intermittent Supervision/Assistance  Patient can return home with the following  A little help with walking and/or transfers;A little help with bathing/dressing/bathroom;Assistance with cooking/housework;Assist for transportation;Help with stairs or ramp for entrance    Equipment Recommendations Other (comment) (shower seat)   Recommendations for Other Services       Functional Status Assessment Patient has had a recent decline in their functional status and demonstrates the ability to make significant improvements in function in a reasonable and predictable amount of time.     Precautions / Restrictions Precautions Precautions: Fall;Other (comment) Precaution Comments: monitor SpO2, 4L O2 at baseline Restrictions Weight Bearing Restrictions: No      Mobility  Bed Mobility Overal bed mobility: Needs Assistance Bed Mobility: Supine to Sit;Sit to Supine     Supine to sit: Min guard;HOB elevated Sit to supine: Min assist   General bed mobility comments: Min guard and extra time to power up to sit L EOB with HOB elevated using bed rails. MinA to assist in elevating legs onto bed and obtaining midline supine in bed.    Transfers Overall transfer level: Needs assistance Equipment used: Rolling walker (2 wheels) Transfers: Sit to/from Stand Sit to Stand: Min guard           General transfer comment: Extra time and x2 attempts before successfully coming to stand with pt rocking to gain momentum, min guard for safety, no LOB.    Ambulation/Gait Ambulation/Gait assistance: Min guard Gait Distance (Feet): 16 Feet Assistive device: Rolling walker (2 wheels) Gait Pattern/deviations: Step-through pattern;Wide base of support;Decreased stride length Gait velocity: reduced Gait velocity interpretation: <1.31 ft/sec, indicative of household ambulator   General Gait Details: Pt with slow, step-through gait with wide BOS likely due to increased body habitus. DOE 3/4 with SpO2 >/= 91% on 4L when mobilizing. Pt reports fatigue primarily through arms with mobility. No LOB, min guard for safety.  Stairs            Wheelchair Mobility    Modified Rankin (  Stroke Patients Only)       Balance Overall balance assessment: Needs assistance Sitting-balance support: No upper extremity supported;Feet  supported Sitting balance-Leahy Scale: Fair Sitting balance - Comments: Static sitting EOB with supervision for safety.   Standing balance support: Reliant on assistive device for balance Standing balance-Leahy Scale: Poor Standing balance comment: Reliant on RW for mobility.                             Pertinent Vitals/Pain Pain Assessment: Faces Faces Pain Scale: Hurts a little bit Pain Location: legs and arms with movement Pain Descriptors / Indicators: Discomfort;Grimacing;Guarding Pain Intervention(s): Monitored during session;Limited activity within patient's tolerance;Repositioned    Home Living Family/patient expects to be discharged to:: Private residence Living Arrangements: Children Available Help at Discharge: Family;Available PRN/intermittently Type of Home: House Home Access: Stairs to enter Entrance Stairs-Rails: None (rail on door itself) Entrance Stairs-Number of Steps: 3   Home Layout: One level (does not go in basement) Home Equipment: Cane - single point;Rollator (4 wheels);BSC/3in1;Cane - quad Additional Comments: Uses 4L supplemental O2 at baseline    Prior Function Prior Level of Function : Independent/Modified Independent             Mobility Comments: Uses rollator primarily. Reports only being able to ambulate short distances in the home before fatiguing recnetly, even needing to sit to rest 1x before reaching proximal bathroom in home. Reports she was previously able to go further before SOB worsened.       Hand Dominance        Extremity/Trunk Assessment   Upper Extremity Assessment Upper Extremity Assessment: Defer to OT evaluation    Lower Extremity Assessment Lower Extremity Assessment: Generalized weakness (edema noted bil legs with fluid weeping off L leg noted; denies numbness/tingling bil)    Cervical / Trunk Assessment Cervical / Trunk Assessment: Other exceptions Cervical / Trunk Exceptions: increased body  habitus  Communication   Communication: No difficulties  Cognition Arousal/Alertness: Awake/alert Behavior During Therapy: WFL for tasks assessed/performed Overall Cognitive Status: Within Functional Limits for tasks assessed                                          General Comments General comments (skin integrity, edema, etc.): SpO2 >/= 91% on 4L when mobilizing, as low as 89% on 4L supine when moving legs in bed    Exercises     Assessment/Plan    PT Assessment Patient needs continued PT services  PT Problem List Decreased mobility;Decreased balance;Decreased activity tolerance;Cardiopulmonary status limiting activity;Decreased strength;Decreased range of motion;Obesity       PT Treatment Interventions DME instruction;Gait training;Functional mobility training;Therapeutic activities;Therapeutic exercise;Balance training;Patient/family education;Stair training;Neuromuscular re-education    PT Goals (Current goals can be found in the Care Plan section)  Acute Rehab PT Goals Patient Stated Goal: to get better PT Goal Formulation: With patient Time For Goal Achievement: 01/24/22 Potential to Achieve Goals: Good    Frequency Min 3X/week     Co-evaluation               AM-PAC PT "6 Clicks" Mobility  Outcome Measure Help needed turning from your back to your side while in a flat bed without using bedrails?: A Little Help needed moving from lying on your back to sitting on the side of a flat bed without using bedrails?: A  Little Help needed moving to and from a bed to a chair (including a wheelchair)?: A Little Help needed standing up from a chair using your arms (e.g., wheelchair or bedside chair)?: A Little Help needed to walk in hospital room?: A Little (believe she is capable of walking 20 ft) Help needed climbing 3-5 steps with a railing? : A Lot 6 Click Score: 17    End of Session Equipment Utilized During Treatment: Oxygen;Gait belt Activity  Tolerance: Patient limited by fatigue Patient left: in bed;with call bell/phone within reach;with bed alarm set Nurse Communication: Mobility status PT Visit Diagnosis: Muscle weakness (generalized) (M62.81);Unsteadiness on feet (R26.81);Other abnormalities of gait and mobility (R26.89);Difficulty in walking, not elsewhere classified (R26.2)    Time: 6438-3779 PT Time Calculation (min) (ACUTE ONLY): 23 min   Charges:   PT Evaluation $PT Eval Moderate Complexity: 1 Mod PT Treatments $Therapeutic Activity: 8-22 mins        Moishe Spice, PT, DPT Acute Rehabilitation Services  Pager: [phone] Office: [phone]   Orvan Falconer 01/10/2022, 10:31 AM

## 2022-01-10 NOTE — Progress Notes (Signed)
Patient off the floor to GI lab

## 2022-01-11 DIAGNOSIS — D539 Nutritional anemia, unspecified: Secondary | ICD-10-CM

## 2022-01-11 DIAGNOSIS — E538 Deficiency of other specified B group vitamins: Secondary | ICD-10-CM

## 2022-01-11 DIAGNOSIS — I5033 Acute on chronic diastolic (congestive) heart failure: Secondary | ICD-10-CM | POA: Diagnosis not present

## 2022-01-11 DIAGNOSIS — E1122 Type 2 diabetes mellitus with diabetic chronic kidney disease: Secondary | ICD-10-CM

## 2022-01-11 DIAGNOSIS — J9610 Chronic respiratory failure, unspecified whether with hypoxia or hypercapnia: Secondary | ICD-10-CM | POA: Diagnosis not present

## 2022-01-11 DIAGNOSIS — N1832 Chronic kidney disease, stage 3b: Secondary | ICD-10-CM

## 2022-01-11 DIAGNOSIS — E1142 Type 2 diabetes mellitus with diabetic polyneuropathy: Secondary | ICD-10-CM

## 2022-01-11 DIAGNOSIS — N179 Acute kidney failure, unspecified: Secondary | ICD-10-CM | POA: Diagnosis not present

## 2022-01-11 DIAGNOSIS — I4891 Unspecified atrial fibrillation: Secondary | ICD-10-CM

## 2022-01-11 DIAGNOSIS — K922 Gastrointestinal hemorrhage, unspecified: Secondary | ICD-10-CM | POA: Diagnosis not present

## 2022-01-11 LAB — GLUCOSE, CAPILLARY
Glucose-Capillary: 140 mg/dL — ABNORMAL HIGH (ref 70–99)
Glucose-Capillary: 178 mg/dL — ABNORMAL HIGH (ref 70–99)
Glucose-Capillary: 149 mg/dL — ABNORMAL HIGH (ref 70–99)
Glucose-Capillary: 116 mg/dL — ABNORMAL HIGH (ref 70–99)

## 2022-01-11 LAB — CBC
HCT: 28.9 % — ABNORMAL LOW (ref 36.0–46.0)
Hemoglobin: 9.2 g/dL — ABNORMAL LOW (ref 12.0–15.0)
MCH: 33.2 pg (ref 26.0–34.0)
MCHC: 31.8 g/dL (ref 30.0–36.0)
MCV: 104.3 fL — ABNORMAL HIGH (ref 80.0–100.0)
Platelets: 305 10*3/uL (ref 150–400)
RBC: 2.77 MIL/uL — ABNORMAL LOW (ref 3.87–5.11)
RDW: 15.7 % — ABNORMAL HIGH (ref 11.5–15.5)
WBC: 9.7 10*3/uL (ref 4.0–10.5)
nRBC: 1.3 % — ABNORMAL HIGH (ref 0.0–0.2)

## 2022-01-11 LAB — BASIC METABOLIC PANEL
Anion gap: 8 (ref 5–15)
BUN: 46 mg/dL — ABNORMAL HIGH (ref 8–23)
CO2: 27 mmol/L (ref 22–32)
Calcium: 8.3 mg/dL — ABNORMAL LOW (ref 8.9–10.3)
Chloride: 101 mmol/L (ref 98–111)
Creatinine, Ser: 1.97 mg/dL — ABNORMAL HIGH (ref 0.44–1.00)
GFR, Estimated: 24 mL/min — ABNORMAL LOW (ref >60)
Glucose, Bld: 146 mg/dL — ABNORMAL HIGH (ref 70–99)
Potassium: 3 mmol/L — ABNORMAL LOW (ref 3.5–5.1)
Sodium: 136 mmol/L (ref 135–145)

## 2022-01-11 MED ORDER — METOPROLOL TARTRATE 25 MG PO TABS
25.0000 mg | ORAL_TABLET | Freq: Two times a day (BID) | ORAL | Status: DC
  Administered 2022-01-11 – 2022-01-26 (×30): 25 mg via ORAL
  Filled 2022-01-11 (×32): qty 1

## 2022-01-11 MED ORDER — POTASSIUM CHLORIDE 20 MEQ PO PACK
40.0000 meq | PACK | Freq: Two times a day (BID) | ORAL | Status: AC
  Administered 2022-01-11 (×2): 40 meq via ORAL
  Filled 2022-01-11 (×2): qty 2

## 2022-01-11 MED ORDER — SODIUM CHLORIDE 0.9 % IV SOLN: INTRAVENOUS | Status: DC

## 2022-01-11 NOTE — Progress Notes (Signed)
Mobility Specialist Progress Note:   01/11/22 1537  Mobility  Activity Ambulated in room  Level of Assistance Standby assist, set-up cues, supervision of patient - no hands on  Assistive Device Front wheel walker  Distance Ambulated (ft) 24 ft  Mobility Ambulated with assistance in room  Mobility Response Tolerated well  Mobility performed by Mobility specialist  $Mobility charge 1 Mobility   Pt received in bed willing to participate in mobility. No complaints of pain. Pt SOB and weak which limited distance. Left in bed with call bell in reach and all needs met.   [facility] Public librarian Phone [phone] Secondary Phone [phone]

## 2022-01-11 NOTE — Progress Notes (Signed)
Occupational Therapy Treatment Patient Details Name: Katelyn Mahoney MRN: [id number] DOB: March 09, 1934 Today's Date: 01/11/2022   History of present illness Pt is an 88 y.o. female who presented 01/08/22 with SOB and weight gain. Pt admitted for acute on chronic HFpEF exacerbation. Pt also with melenic stools/heme positive, EGD 01/10/22. Plan for colonoscopy 11/13.PMH includes DM II, HTN, obesity, CKD, HLD, chronic LE edema, chronic hypoxic respiratory failure on 4L oxygen at home, Afib, HfpEF   OT comments  Pt supine in bed and agreeable to OT session.  She reports typically completing ADLs, light meal prep at home when her daughter is at work.  She mentions he son in law is available during this time.  Today, pt requires up to mod assist for LB ADLs, setup for grooming and min assist for LB ADLs; min guard for transfers and limited mobility in room.  Pt on 4L with SPO2 maintained with 1 brief desaturation to 88%, DOE 2/4 and requires cueing to utilize PLB during session.  Patient reports increased fatigue and weakness, continue to recommend SNF at this time to optimize strength, safety and return to PLOF prior to dc home.  IF patient can confirm assist at dc when daughter is at work, [facility] would be appropriate.  Will follow.    Recommendations for follow up therapy are one component of a multi-disciplinary discharge planning process, led by the attending physician.  Recommendations may be updated based on patient status, additional functional criteria and insurance authorization.    Follow Up Recommendations  Skilled nursing-short term rehab (<3 hours/day)    Assistance Recommended at Discharge Intermittent Supervision/Assistance  Patient can return home with the following  A little help with walking and/or transfers;A little help with bathing/dressing/bathroom;Assistance with cooking/housework   Equipment Recommendations  None recommended by OT    Recommendations for Other Services       Precautions / Restrictions Precautions Precautions: Fall;Other (comment) Precaution Comments: monitor SpO2, 4L O2 at baseline Restrictions Weight Bearing Restrictions: No       Mobility Bed Mobility Overal bed mobility: Needs Assistance Bed Mobility: Supine to Sit;Sit to Supine     Supine to sit: Min assist;HOB elevated Sit to supine: Mod assist;HOB elevated   General bed mobility comments: assist for trunk to ascend, and BLEs back to bed. REports sleeping in a recliner at home.    Transfers                         Balance Overall balance assessment: Needs assistance Sitting-balance support: No upper extremity supported;Feet supported Sitting balance-Leahy Scale: Fair Sitting balance - Comments: supervision   Standing balance support: During functional activity;Reliant on assistive device for balance;Bilateral upper extremity supported Standing balance-Leahy Scale: Poor Standing balance comment: relies on UE support                           ADL either performed or assessed with clinical judgement   ADL Overall ADL's : Needs assistance/impaired     Grooming: Set up;Sitting               Lower Body Dressing: Minimal assistance;Sit to/from stand Lower Body Dressing Details (indicate cue type and reason): assist for socks but uses sock aide at home, min guard in standing Toilet Transfer: Min guard;Ambulation Toilet Transfer Details (indicate cue type and reason): simulated in room, using RW         Functional mobility during ADLs: Min guard;Rolling  walker (2 wheels) General ADL Comments: pt limited by weakness, decreased activity tolerance.  Pt on 4L [place] during session with SPO2 maintained with 1 desaturation briefly to 88%.  DOE 2/4 and requires cueing for PLB throughout session.    Extremity/Trunk Assessment Upper Extremity Assessment Upper Extremity Assessment: Generalized weakness   Lower Extremity Assessment Lower Extremity  Assessment: Defer to PT evaluation        Vision       Perception     Praxis      Cognition Arousal/Alertness: Awake/alert Behavior During Therapy: WFL for tasks assessed/performed Overall Cognitive Status: Within Functional Limits for tasks assessed                                            Exercises     Shoulder Instructions       General Comments VSS, pt interested in SNF now    Pertinent Vitals/ Pain       Pain Assessment: 0-10 Faces Pain Scale: Hurts a little bit Pain Location: arms Pain Descriptors / Indicators: Discomfort;Sore Pain Intervention(s): Monitored during session;Repositioned;Limited activity within patient's tolerance  Home Living                                          Prior Functioning/Environment              Frequency  Min 2X/week        Progress Toward Goals  OT Goals(current goals can now be found in the care plan section)  Progress towards OT goals: Progressing toward goals  Acute Rehab OT Goals Patient Stated Goal: get stronger OT Goal Formulation: With patient  Plan Discharge plan remains appropriate;Frequency remains appropriate    Co-evaluation                 AM-PAC OT "6 Clicks" Daily Activity     Outcome Measure   Help from another person eating meals?: None Help from another person taking care of personal grooming?: A Little Help from another person toileting, which includes using toliet, bedpan, or urinal?: A Little Help from another person bathing (including washing, rinsing, drying)?: A Lot Help from another person to put on and taking off regular upper body clothing?: A Little Help from another person to put on and taking off regular lower body clothing?: A Little 6 Click Score: 18    End of Session Equipment Utilized During Treatment: Oxygen;Rolling walker (2 wheels) (4L)  OT Visit Diagnosis: Unsteadiness on feet (R26.81);Muscle weakness (generalized)  (M62.81)   Activity Tolerance Patient tolerated treatment well   Patient Left in bed;with call bell/phone within reach;with bed alarm set   Nurse Communication Mobility status        Time: 3646-8032 OT Time Calculation (min): 23 min  Charges: OT General Charges $OT Visit: 1 Visit OT Treatments $Self Care/Home Management : 23-37 mins  [place] Pager [phone] Office [phone]   Delight Stare 01/11/2022, 11:03 AM

## 2022-01-11 NOTE — Progress Notes (Signed)
Subjective:   Patient seen at bedside this AM.  She states that she had trouble sleeping last night and that her breathing still seems labored to her.  However, she has noticed significant improvement in her BLE swelling since started on scheduled Lasix yesterday.  She is not aware of having any bloody stools overnight.  No other complaints or concerns today.  Objective:  Vital signs in last 24 hours: Vitals:   01/10/22 1530 01/10/22 1703 01/10/22 2048 01/11/22 0447  BP: (!) 109/53 (!) 105/46 (!) 165/54 (!) 125/59  Pulse: (!) 48 (!) 52 73 (!) 51  Resp: (!) 33 (!) 23 18 (!) 24  Temp:  97.8 F (36.6 C) 97.9 F (36.6 C) (!) 97.5 F (36.4 C)  TempSrc:  Oral Oral Oral  SpO2: 96% 97% 93% 97%  Weight:    (!) 136.4 kg  Height:       Constitutional: Chronically ill appearing elderly female, no acute distress HENT: Normocephalic and atraumatic, EOMI, conjunctivae are normal, moist mucous membranes Cardiovascular: Regular rate, irregular rhythm, S1 and S2 present, + systolic murmur, no rubs, gallops. Respiratory: Effort normal on 4 L O2, resting comfortably.  No accessory muscle use.  Exam limited due to body habitus. GI: Nondistended, soft, nontender to palpation, normal bowel sounds Musculoskeletal: Normal bulk and tone.  2+ BLE pitting edema to knees, improved from exam yesterday Neurological: Is alert and oriented x4, no apparent focal deficits noted. Skin: Warm and dry. No rash, erythema, lesions noted. Psychiatric: Normal mood and affect. Behavior is normal.    Assessment/Plan:  Principal Problem:   Acute exacerbation of CHF (congestive heart failure) (HCC) Active Problems:   AKI (acute kidney injury) ([place])   Gastrointestinal hemorrhage   Persistent atrial fibrillation (HCC)  #Acute on chronic HFpEF exacerbation #Chronic respiratory failure, on 3-4 L at home Patient was admitted to our service with acute on chronic HFpEF exacerbation.  On admit, the patient was noted to be  tachycardic and in atrial fibrillation.  It is likely that her new A. fib could have led to this exacerbation.  She states that the swelling has improved on Lasix 80 mg twice daily that was started yesterday.  No output is recorded, however her weight is the same as yesterday. Creatinine is improving to 1.97 today, down from 2.15 yesterday. Given improvement in creatinine and given clinical improvement of her volume status, will continue our current diuretic regimen and monitor fluid status. - IV lasix 80mg  BID - Strict I&O'd and daily weights - Trend BMP  - Unna boots   #Acute on chronic macrocytic anemia #Melena #Vitamin B12 deficiency Patient noted to have Hb 9.0 (baseline ~11 2 wks ago) in setting of melena that the patient reports has been present for two weeks. The patient continues to be hemodynamically stable and denies melena overnight.  The GI team has been consulted and recently performed an EGD, which showed a single nonbleeding angiodysplastic lesion in the duodenum that was treated with monopolar probe.  Plan is to give bowel prep today in anticipation for colonoscopy tomorrow.  In the meantime, we we will continue to hold her home Eliquis. - GI consulted, appreciate recommendations  - Plan for colonoscopy, 1/13 and bowel prep today - IV protonix 40mg  bid - Holding Eliquis - Trend CBC, transfuse for Hb <7   #Atrial fibrillation Patient noted to have new onset atrial fibrillation during her previous admisison for which cardiology was consulted.  She reports that she has been compliant with her  diltiazem, Eliquis, and metoprolol at home.  She was noted to be in A. fib on telemetry today.  We will continue diltiazem and metoprolol but are holding Eliquis in the setting of suspected GI bleed. - Diltiazem 30mg  q6h  - Metoprolol 25mg  bid  - Holding Eliquis - Cardiac monitoring    #AKI on CKD3b Patient noted to have worsening sCr on admission with sCr up to 2.29 (baseline ~1.5-1.6 two  weeks ago).  Today, creatinine has improved to 1.97 after diuresis. Likely cardiorenal in etiology. Will continue diuresis as above. - Trend renal function - Strict I&O as above - Avoid nephrotoxic agents    #Diabetes mellitus with peripheral neuropathy  Patient is on 34U basal insulin and 6u tid meal time coverage at home. Most recent HbA1c 7.4.  - Semglee 17U nightly, uptitrate to 34U once advanced diet - SSI with meals - Continue home gabapentin  - CBG monitoring   #Physical deconditioning Patient reports using a walker to assist with ambulation. She reports significant dyspnea on exertion. Suspect that this may be in part due to physical deconditioning.  Patient was previously admitted for SNF on discharge but elected to go home with home health. PT is recommending home health PT, OT is recommending SNF. Will continue to follow their recommendations during her hospitalization. -Appreciate PT/OT recs   Prior to Admission Living Arrangement: Home Anticipated Discharge Location: Home health PT versus SNF Dispo: Anticipated discharge pending medical work-up and stability  Orvis Brill, MD 01/11/2022, 7:05 AM Pager: [phone]  After 5pm on weekdays and 1pm on weekends: On Call pager [phone]

## 2022-01-12 ENCOUNTER — Encounter (HOSPITAL_COMMUNITY)
Admission: EM | Disposition: A | Discharge status: Home Health | Payer: Self-pay | Source: Home / Self Care | Attending: Internal Medicine

## 2022-01-12 ENCOUNTER — Inpatient Hospital Stay (HOSPITAL_COMMUNITY): Payer: Medicare Other | Admitting: Certified Registered"

## 2022-01-12 ENCOUNTER — Encounter (HOSPITAL_COMMUNITY): Payer: Self-pay | Admitting: Gastroenterology

## 2022-01-12 DIAGNOSIS — K922 Gastrointestinal hemorrhage, unspecified: Secondary | ICD-10-CM | POA: Diagnosis not present

## 2022-01-12 DIAGNOSIS — I4819 Other persistent atrial fibrillation: Secondary | ICD-10-CM | POA: Diagnosis not present

## 2022-01-12 DIAGNOSIS — I5033 Acute on chronic diastolic (congestive) heart failure: Secondary | ICD-10-CM | POA: Diagnosis not present

## 2022-01-12 HISTORY — PX: HEMOSTASIS CLIP PLACEMENT: SHX6857

## 2022-01-12 HISTORY — PX: HOT HEMOSTASIS: SHX5433

## 2022-01-12 HISTORY — PX: COLONOSCOPY WITH PROPOFOL: SHX5780

## 2022-01-12 LAB — BASIC METABOLIC PANEL
Anion gap: 8 (ref 5–15)
BUN: 40 mg/dL — ABNORMAL HIGH (ref 8–23)
CO2: 28 mmol/L (ref 22–32)
Calcium: 8.5 mg/dL — ABNORMAL LOW (ref 8.9–10.3)
Chloride: 102 mmol/L (ref 98–111)
Creatinine, Ser: 1.87 mg/dL — ABNORMAL HIGH (ref 0.44–1.00)
GFR, Estimated: 26 mL/min — ABNORMAL LOW (ref >60)
Glucose, Bld: 139 mg/dL — ABNORMAL HIGH (ref 70–99)
Potassium: 3.4 mmol/L — ABNORMAL LOW (ref 3.5–5.1)
Sodium: 138 mmol/L (ref 135–145)

## 2022-01-12 LAB — GLUCOSE, CAPILLARY
Glucose-Capillary: 124 mg/dL — ABNORMAL HIGH (ref 70–99)
Glucose-Capillary: 144 mg/dL — ABNORMAL HIGH (ref 70–99)
Glucose-Capillary: 164 mg/dL — ABNORMAL HIGH (ref 70–99)
Glucose-Capillary: 167 mg/dL — ABNORMAL HIGH (ref 70–99)

## 2022-01-12 LAB — CBC
HCT: 30.4 % — ABNORMAL LOW (ref 36.0–46.0)
Hemoglobin: 9.9 g/dL — ABNORMAL LOW (ref 12.0–15.0)
MCH: 33.9 pg (ref 26.0–34.0)
MCHC: 32.6 g/dL (ref 30.0–36.0)
MCV: 104.1 fL — ABNORMAL HIGH (ref 80.0–100.0)
Platelets: 313 10*3/uL (ref 150–400)
RBC: 2.92 MIL/uL — ABNORMAL LOW (ref 3.87–5.11)
RDW: 15.6 % — ABNORMAL HIGH (ref 11.5–15.5)
WBC: 9.8 10*3/uL (ref 4.0–10.5)
nRBC: 1.1 % — ABNORMAL HIGH (ref 0.0–0.2)

## 2022-01-12 LAB — MAGNESIUM: Magnesium: 2 mg/dL (ref 1.7–2.4)

## 2022-01-12 SURGERY — COLONOSCOPY WITH PROPOFOL
Anesthesia: Monitor Anesthesia Care

## 2022-01-12 MED ORDER — PROPOFOL 10 MG/ML IV BOLUS
INTRAVENOUS | Status: DC | PRN
  Administered 2022-01-12: 15 mg via INTRAVENOUS

## 2022-01-12 MED ORDER — LIDOCAINE 2% (20 MG/ML) 5 ML SYRINGE
INTRAMUSCULAR | Status: DC | PRN
Start: 2022-01-12 — End: 2022-01-12
  Administered 2022-01-12: 80 mg via INTRAVENOUS

## 2022-01-12 MED ORDER — POTASSIUM CHLORIDE 20 MEQ PO PACK
40.0000 meq | PACK | Freq: Two times a day (BID) | ORAL | Status: AC
  Administered 2022-01-12: 40 meq via ORAL

## 2022-01-12 MED ORDER — PHENYLEPHRINE 40 MCG/ML (10ML) SYRINGE FOR IV PUSH (FOR BLOOD PRESSURE SUPPORT)
PREFILLED_SYRINGE | INTRAVENOUS | Status: DC | PRN
  Administered 2022-01-12: 80 ug via INTRAVENOUS
  Administered 2022-01-12: 120 ug via INTRAVENOUS

## 2022-01-12 MED ORDER — APIXABAN 2.5 MG PO TABS
2.5000 mg | ORAL_TABLET | Freq: Two times a day (BID) | ORAL | Status: DC
  Administered 2022-01-12 – 2022-01-26 (×28): 2.5 mg via ORAL
  Filled 2022-01-12 (×29): qty 1

## 2022-01-12 MED ORDER — PROPOFOL 500 MG/50ML IV EMUL
INTRAVENOUS | Status: DC | PRN
  Administered 2022-01-12: 75 ug/kg/min via INTRAVENOUS

## 2022-01-12 SURGICAL SUPPLY — 22 items

## 2022-01-12 NOTE — Anesthesia Postprocedure Evaluation (Signed)
Anesthesia Post Note  Patient: Katelyn Mahoney  Procedure(s) Performed: COLONOSCOPY WITH PROPOFOL HOT HEMOSTASIS (ARGON PLASMA COAGULATION/BICAP) HEMOSTASIS CLIP PLACEMENT     Patient location during evaluation: PACU Anesthesia Type: MAC Level of consciousness: awake and alert Pain management: pain level controlled Vital Signs Assessment: post-procedure vital signs reviewed and stable Respiratory status: spontaneous breathing and respiratory function stable Cardiovascular status: stable Postop Assessment: no apparent nausea or vomiting Anesthetic complications: no   No notable events documented.  Last Vitals:  Vitals:   01/12/22 1330 01/12/22 1345  BP: 113/70 123/80  Pulse: (!) 47 (!) 47  Resp: 19 20  Temp:    SpO2: 98% 96%    Last Pain:  Vitals:   01/12/22 1155  TempSrc: Temporal  PainSc: 4                  Candra R Chauntae Hults

## 2022-01-12 NOTE — Care Management Important Message (Signed)
Important Message  Patient Details  Name: Katelyn Mahoney MRN: [id number] Date of Birth: June 20, 1934   Medicare Important Message Given:  Yes     Shelda Altes 01/12/2022, 10:25 AM

## 2022-01-12 NOTE — Progress Notes (Signed)
Orthopedic Tech Progress Note Patient Details:  Katelyn Mahoney 09/15/1934 [id number]  Ortho Devices Type of Ortho Device: Louretta Parma boot Ortho Device/Splint Location: BLE Ortho Device/Splint Interventions: Ordered, Application      Katelyn Mahoney A Myriah Boggus 01/12/2022, 10:24 AM

## 2022-01-12 NOTE — Progress Notes (Signed)
Subjective:   Patient seen at bedside this AM.  Patient states that she feels okay today.  She felt shortness of breath briefly yesterday when her oxygen was undone, but once she got this hooked back up she was breathing better.  She feels like the swelling has gone down significantly in her legs.  No other complaints or concerns today.  Objective:  Vital signs in last 24 hours: Vitals:   01/12/22 0011 01/12/22 0037 01/12/22 0217 01/12/22 0506  BP:  (!) 100/51 (!) 111/40 (!) 139/50  Pulse:  77 (!) 101 78  Resp:  20  (!) 22  Temp:    98 F (36.7 C)  TempSrc:    Oral  SpO2:  100%  97%  Weight: (!) 137.1 kg     Height:       Constitutional: Chronically ill appearing elderly female, no acute distress HENT: Normocephalic and atraumatic, EOMI, conjunctivae are normal, moist mucous membranes Cardiovascular: Regular rate, irregular rhythm, S1 and S2 present, + systolic murmur, no rubs, gallops. Respiratory: Effort normal on 4 L O2, resting comfortably.  No accessory muscle use.  Exam limited due to body habitus. GI: Nondistended, soft, nontender to palpation, normal bowel sounds Musculoskeletal: Normal bulk and tone. Unna boots intact, 1-2+ pitting edema in BLE Neurological: Is alert and oriented x4, no apparent focal deficits noted. Skin: Warm and dry. No rash, erythema, lesions noted. Psychiatric: Normal mood and affect. Behavior is normal.   Assessment/Plan:  Principal Problem:   Acute exacerbation of CHF (congestive heart failure) (HCC) Active Problems:   AKI (acute kidney injury) ([place])   Gastrointestinal hemorrhage   Persistent atrial fibrillation (HCC)  #Acute on chronic HFpEF exacerbation #Chronic respiratory failure, on 3-4 L at home Patient was admitted to our service with acute on chronic HFpEF exacerbation.  On admit, the patient was noted to be tachycardic and in atrial fibrillation.  It is likely that her new A. fib could have led to this exacerbation.  She states  that the swelling has improved on Lasix 80 mg twice daily that was started on 01/11.  Output is inaccurate due to incontinence, however her weight has increased 1 kg since yesterday. Creatinine is improving to 1.87 today, down from 1.97 yesterday. Given improvement in creatinine and given clinical improvement of her volume status, will continue our current diuretic regimen and continue to monitor her fluid status. - IV lasix 80mg  BID - Strict I&O'd and daily weights - Trend BMP  - Unna boots   #Acute on chronic macrocytic anemia #Melena #Vitamin B12 deficiency Patient noted to have Hb 9.0 (baseline ~11 2 wks ago) in setting of melena that the patient reports has been present for two weeks. The patient continues to be hemodynamically stable and denies melena overnight.  The GI team has been consulted and recently performed an EGD, which showed a single nonbleeding angiodysplastic lesion in the duodenum that was treated with monopolar probe.  Plan is for colonoscopy today. In the meantime, we we will continue to hold her home Eliquis. - GI consulted, appreciate recommendations  - Plan for colonoscopy today, 01/13 - IV protonix 40mg  bid - Holding Eliquis - Trend CBC, transfuse for Hb <7   #Atrial fibrillation Patient noted to have new onset atrial fibrillation during her previous admisison for which cardiology was consulted.  She reports that she has been compliant with her diltiazem, Eliquis, and metoprolol at home.  We will continue diltiazem and metoprolol but are holding Eliquis in the setting of  suspected GI bleed. - Diltiazem 30mg  q6h  - Metoprolol 25mg  bid  - Holding Eliquis - Cardiac monitoring    #AKI on CKD3b Patient noted to have worsening sCr on admission with sCr up to 2.29 (baseline ~1.5-1.6 two weeks ago).  Today, creatinine has improved to 1.87 after diuresis. Likely cardiorenal in etiology. Will continue diuresis as above. - Trend renal function - Strict I&O as above - Avoid  nephrotoxic agents    #Diabetes mellitus with peripheral neuropathy  Patient is on 34U basal insulin and 6u tid meal time coverage at home. Most recent HbA1c 7.4.  - Semglee 17U nightly - SSI with meals - Continue home gabapentin  - CBG monitoring   #Physical deconditioning Patient reports using a walker to assist with ambulation. She reports significant dyspnea on exertion. Suspect that this may be in part due to physical deconditioning.  Patient was previously admitted for SNF on discharge but elected to go home with home health. PT is recommending home health PT, OT is recommending SNF. Will continue to follow their recommendations here.  -Appreciate PT/OT recs   Prior to Admission Living Arrangement: Home Anticipated Discharge Location: Home health PT versus SNF Dispo: Anticipated discharge pending medical work-up and stability  Orvis Brill, MD 01/12/2022, 6:14 AM Pager: [phone]  After 5pm on weekdays and 1pm on weekends: On Call pager [phone]

## 2022-01-12 NOTE — Anesthesia Preprocedure Evaluation (Addendum)
Anesthesia Evaluation  Patient identified by MRN, date of birth, ID band Patient awake    Reviewed: Allergy & Precautions, NPO status , Patient's Chart, lab work & pertinent test results  Airway Mallampati: II  TM Distance: >3 FB Neck ROM: Full    Dental  (+) Upper Dentures, Partial Lower   Pulmonary shortness of breath (4L 02 at all times) and Long-Term Oxygen Therapy,    breath sounds clear to auscultation       Cardiovascular Exercise Tolerance: Poor hypertension, Pt. on medications and Pt. on home beta blockers +CHF  + Valvular Problems/Murmurs MR  Rhythm:Regular Rate:Normal     Neuro/Psych negative neurological ROS     GI/Hepatic negative GI ROS, Neg liver ROS,   Endo/Other  diabetes, Type 2, Insulin DependentMorbid obesity  Renal/GU CRFRenal disease     Musculoskeletal  (+) Arthritis , Osteoarthritis,    Abdominal   Peds  Hematology  (+) anemia ,   Anesthesia Other Findings   Reproductive/Obstetrics                             Anesthesia Physical Anesthesia Plan  ASA: 4  Anesthesia Plan: MAC   Post-op Pain Management:    Induction: Intravenous  PONV Risk Score and Plan: 2 and Propofol infusion, Treatment may vary due to age or medical condition, TIVA and Ondansetron  Airway Management Planned: Natural Airway and Simple Face Mask  Additional Equipment: None  Intra-op Plan:   Post-operative Plan:   Informed Consent: I have reviewed the patients History and Physical, chart, labs and discussed the procedure including the risks, benefits and alternatives for the proposed anesthesia with the patient or authorized representative who has indicated his/her understanding and acceptance.   Patient has DNR.  Discussed DNR with patient and Suspend DNR.     Plan Discussed with: CRNA and Anesthesiologist  Anesthesia Plan Comments:        Anesthesia Quick Evaluation

## 2022-01-12 NOTE — Progress Notes (Signed)
Physical Therapy Treatment Patient Details Name: Katelyn Mahoney MRN: [id number] DOB: 10-03-1934 Today's Date: 01/12/2022   History of Present Illness Pt is an 88 y.o. female who presented 01/08/22 with SOB and weight gain. Pt admitted for acute on chronic HFpEF exacerbation. Pt also with melenic stools/heme positive, plan for EGD 01/10/22. S/p colonoscopy 1/13. PMH includes DM II, HTN, obesity, CKD, HLD, chronic LE edema, chronic hypoxic respiratory failure on 4L oxygen at home, Afib, HfpEF    PT Comments    Pt was seen today s/p colonoscopy and reports being too fatigued to get to EOB or OOB today. Pt agreeable to bed level upper and lower extremity exercises though. Pt required AAROM for her lower extremities due to weakness and inability to overcome the weight of the edema in the legs. Educated pt on elevating legs and performing ankle pumps to help manage the edema. Discussed follow-up PT in the SNF vs [facility] setting, with pt agreeable to SNF considering her increasing fatigue and very limited endurance. Updated d/c recs to SNF. If pt changes her mind to go home and has the assistance necessary then she can go home with HHPT follow-up.    Recommendations for follow up therapy are one component of a multi-disciplinary discharge planning process, led by the attending physician.  Recommendations may be updated based on patient status, additional functional criteria and insurance authorization.  Follow Up Recommendations  Skilled nursing-short term rehab (<3 hours/day) (HHPT if she prefers to go home with assistance)     Assistance Recommended at Discharge Intermittent Supervision/Assistance  Patient can return home with the following A little help with walking and/or transfers;A little help with bathing/dressing/bathroom;Assistance with cooking/housework;Assist for transportation;Help with stairs or ramp for entrance   Equipment Recommendations  Other (comment) (shower seat)    Recommendations  for Other Services       Precautions / Restrictions Precautions Precautions: Fall;Other (comment) Precaution Comments: monitor SpO2, 4L O2 at baseline Restrictions Weight Bearing Restrictions: No     Mobility  Bed Mobility               General bed mobility comments: Pt politely declined EOB or OOB mobility this date, requesting to perform only bed level exercises due to being too fatigued to get up.    Transfers                   General transfer comment: Pt politely declined EOB or OOB mobility this date, requesting to perform only bed level exercises due to being too fatigued to get up.    Ambulation/Gait               General Gait Details: Pt politely declined EOB or OOB mobility this date, requesting to perform only bed level exercises due to being too fatigued to get up.   Stairs             Wheelchair Mobility    Modified Rankin (Stroke Patients Only)       Balance                                            Cognition Arousal/Alertness: Awake/alert Behavior During Therapy: WFL for tasks assessed/performed Overall Cognitive Status: Within Functional Limits for tasks assessed  Exercises General Exercises - Upper Extremity Shoulder Flexion: AROM;Strengthening;Both;10 reps;Supine Elbow Flexion: AROM;Strengthening;Both;10 reps;Supine Elbow Extension: AROM;Strengthening;Both;10 reps;Supine General Exercises - Lower Extremity Ankle Circles/Pumps: AROM;Both;10 reps;Supine Quad Sets: AROM;Strengthening;Both;10 reps;Supine Heel Slides: AAROM;Both;10 reps;Supine;Strengthening Hip ABduction/ADduction: AAROM;Strengthening;Both;10 reps;Supine Straight Leg Raises: AAROM;Strengthening;Both;10 reps;Supine    General Comments        Pertinent Vitals/Pain Pain Assessment: Faces Faces Pain Scale: Hurts a little bit Pain Location: generalized with AROM Pain  Descriptors / Indicators: Discomfort;Grimacing;Guarding Pain Intervention(s): Limited activity within patient's tolerance;Monitored during session;Repositioned    Home Living                          Prior Function            PT Goals (current goals can now be found in the care plan section) Acute Rehab PT Goals Patient Stated Goal: to go to SNF at this time PT Goal Formulation: With patient Time For Goal Achievement: 01/24/22 Potential to Achieve Goals: Good Progress towards PT goals: Not progressing toward goals - comment (limited by fatigue today)    Frequency    Min 3X/week      PT Plan Discharge plan needs to be updated    Co-evaluation              AM-PAC PT "6 Clicks" Mobility   Outcome Measure  Help needed turning from your back to your side while in a flat bed without using bedrails?: A Little Help needed moving from lying on your back to sitting on the side of a flat bed without using bedrails?: A Little Help needed moving to and from a bed to a chair (including a wheelchair)?: A Little Help needed standing up from a chair using your arms (e.g., wheelchair or bedside chair)?: A Little Help needed to walk in hospital room?: A Little (believe she is capable of walking 20 ft) Help needed climbing 3-5 steps with a railing? : A Lot 6 Click Score: 17    End of Session Equipment Utilized During Treatment: Oxygen Activity Tolerance: Patient limited by fatigue Patient left: in bed;with call bell/phone within reach;with bed alarm set Nurse Communication: Mobility status PT Visit Diagnosis: Muscle weakness (generalized) (M62.81);Unsteadiness on feet (R26.81);Other abnormalities of gait and mobility (R26.89);Difficulty in walking, not elsewhere classified (R26.2)     Time: 1540-1600 PT Time Calculation (min) (ACUTE ONLY): 20 min  Charges:  $Therapeutic Exercise: 8-22 mins                     Moishe Spice, PT, DPT Acute Rehabilitation  Services  Pager: [phone] Office: [phone]    Orvan Falconer 01/12/2022, 4:10 PM

## 2022-01-12 NOTE — Anesthesia Procedure Notes (Signed)
Procedure Name: MAC Date/Time: 01/12/2022 1:03 PM Performed by: Imagene Riches, CRNA Pre-anesthesia Checklist: Patient identified, Emergency Drugs available, Suction available, Patient being monitored and Timeout performed Patient Re-evaluated:Patient Re-evaluated prior to induction Oxygen Delivery Method: Simple face mask

## 2022-01-12 NOTE — Hospital Course (Addendum)
#Acute on chronic HFpEF exacerbation # Chronic respiratory failure Patient is presenting with several weeks of worsening bilateral lower extremity edema, weight gain and progressively worsening dyspnea on exertion and orthopnea. She has been titrating up her lasix dosing as outpatient; however, patient reports that she has not noted any significant improvement. She has also reported progressively worsening dyspnea on exertion and orthopnea. Patient was normotensive, slightly tachycardic to 100-120s, saturating 98-100% on 4 L O2.   On examination, patient appeared to be significantly hypervolemic with bilateral lower extremity pitting edema and diffuse crackles, worse at bilateral bases.  BNP 376. She was given IV Lasix 80 mg once in the ED.  Unna boots were applied, strict I's and O's and daily weights were recorded, and BMP was trended throughout her hospitalization.  On day 2 of her hospitalization, we started scheduled IV Lasix 80 mg twice daily and continue to monitor volume status.  The following day, the patient endorsed improvement of her BLE edema as result of the IV Lasix.  Ins and outs were inaccurate in the setting of incontinence, however she appeared to improve clinically on exam.  The same Lasix regimen was also continued the following day given improvement of her volume status again. The patient was transitioned to oral Lasix on 1/18. However, though the patient had adequate UOP, her creatinine continued to rise. Additionally, on 1/20, the patient had worsening SOB and increased WOB. Due to concern for safety with DC on oral diuretics, we decided to keep the patient in the hospital, obtain CXR and further workup of her AKI, and consulted nephrology (see below). The same day, she was given IV Lasix 80 mg and CXR showed mild vascular congestion without edema. She was continued on IV diuresis, up to Lasix 120 mg TID and metolazone. Pt had improvement of her volume status on this regimen. On 01/24, pt  was transitioned to oral torsemide and metolazone regimens. Given hypotension and positive orthostatics on 01/25, oral diuretics were held overnight until 01/26 ***  Acute on chronic macrocytic anemia #Melena #Vitamin B12 deficiency Patient noted to have Hb 9.0 (baseline ~11 2 wks ago) in setting of melena that the patient reports has been present for two weeks. The patient was noted to be hemodynamically stable on arrival and denied episodes of melena in the hospital. She is not taking NSAIDs or iron supplements. FOBT positive in the ED. GI team was consulted, and they recommend continued Protonix and an EGD.  EGD showed single nonbleeding angiodysplastic lesion in the duodenum that was treated with the monopolar probe.  The following day, she was given bowel prep in anticipation for colonoscopy on 1/13, which showed three small nonbleeding proximal colon AVMs; these were ablated.  Her Eliquis was held until after her colonoscopy.  Her CBC was trended throughout her hospitalization.  #Atrial fibrillation Patient noted to have new onset atrial fibrillation during her previous admisison for which cardiology was consulted.  She reports that she has been compliant with her diltiazem, Eliquis, and metoprolol at home.  We continued her diltiazem and metoprolol regimens but held Eliquis until 1/13 in the setting of suspected GI bleed. Otherwise, this was continued throughout her hospitalization.   #AKI on CKD3b Patient noted to have worsening sCr on admission with sCr up to 2.29 (baseline ~1.5-1.6 two weeks ago).  During her hospitalization, creatinine improved with diuresis however, started to uptrend again despite diuresis to ~2, and became stable at this level for a few days.  Obtained UA which showed  moderate leukocytes and rare bacteria and a renal ultrasound which was unremarkable. Nephrology was consulted for their recommendations. At that time, patient was transitioned back to IV Lasix for several days.  Mild decrease in Cr with a nadir of 1.92 until switched back to oral diuretics (torsemide and metolazone). ***   #Diabetes mellitus with peripheral neuropathy  Patient is on 34U basal insulin and 6u tid meal time coverage at home. Most recent HbA1c 7.4.  CBGs were well controlled on basal, mealtime, and sliding scale insulin in the hospital.  Her home gabapentin was continued throughout her hospitalization.   #Physical deconditioning Patient reports using a walker to assist with ambulation. She reports significant dyspnea on exertion. Suspect that this may be in part due to physical deconditioning.  Patient was previously admitted for SNF on discharge but elected to go home with home health. PT/OT recommended SNF, however patient was only amenable to 2 choices which were not offering beds at the time.  Patient declined other bed offers and chose to discharge on home health PT/T. Later during her hospitalization, pt agreeable to [facility]. ***

## 2022-01-12 NOTE — Transfer of Care (Signed)
Immediate Anesthesia Transfer of Care Note  Patient: Katelyn Mahoney  Procedure(s) Performed: COLONOSCOPY WITH PROPOFOL HOT HEMOSTASIS (ARGON PLASMA COAGULATION/BICAP) HEMOSTASIS CLIP PLACEMENT  Patient Location: PACU  Anesthesia Type:MAC  Level of Consciousness: drowsy  Airway & Oxygen Therapy: Patient Spontanous Breathing and Patient connected to face mask oxygen  Post-op Assessment: Report given to RN and Post -op Vital signs reviewed and stable  Post vital signs: Reviewed and stable  Last Vitals:  Vitals Value Taken Time  BP 133/73 01/12/22 1356  Temp    Pulse 60 01/12/22 1359  Resp 20 01/12/22 1359  SpO2 97 % 01/12/22 1359  Vitals shown include unvalidated device data.  Last Pain:  Vitals:   01/12/22 1155  TempSrc: Temporal  PainSc: 4          Complications: No notable events documented.

## 2022-01-12 NOTE — Op Note (Signed)
[facility] Patient Name: Katelyn Mahoney Procedure Date : 01/12/2022 MRN: [id number] Attending MD: Carol Ada , MD Date of Birth: Mar 28, 1934 CSN: [id number] Age: 88 Admit Type: Inpatient Procedure:                Colonoscopy Indications:              Heme positive stool, Melena Providers:                Carol Ada, MD, Doristine Johns, RN, Luan Moore, Technician Referring MD:              Medicines:                Propofol per Anesthesia Complications:            No immediate complications. Estimated Blood Loss:     Estimated blood loss: none. Procedure:                Pre-Anesthesia Assessment:                           - Prior to the procedure, a History and Physical                            was performed, and patient medications and                            allergies were reviewed. The patient's tolerance of                            previous anesthesia was also reviewed. The risks                            and benefits of the procedure and the sedation                            options and risks were discussed with the patient.                            All questions were answered, and informed consent                            was obtained. Prior Anticoagulants: The patient has                            taken Eliquis (apixaban), last dose was 8 days                            prior to procedure. ASA Grade Assessment: III - A                            patient with severe systemic disease. After  reviewing the risks and benefits, the patient was                            deemed in satisfactory condition to undergo the                            procedure.                           - Sedation was administered by an anesthesia                            professional. Deep sedation was attained.                           After obtaining informed consent, the colonoscope                             was passed under direct vision. Throughout the                            procedure, the patient's blood pressure, pulse, and                            oxygen saturations were monitored continuously. The                            CF-HQ190L ([id number]) Olympus colonoscope was                            introduced through the anus and advanced to the the                            cecum, identified by appendiceal orifice and                            ileocecal valve. The colonoscopy was performed                            without difficulty. The patient tolerated the                            procedure well. The quality of the bowel                            preparation was evaluated using the BBPS [facility]                            Bowel Preparation Scale) with scores of: Right                            Colon = 2 (minor amount of residual staining, small  fragments of stool and/or opaque liquid, but mucosa                            seen well), Transverse Colon = 2 (minor amount of                            residual staining, small fragments of stool and/or                            opaque liquid, but mucosa seen well) and Left Colon                            = 3 (entire mucosa seen well with no residual                            staining, small fragments of stool or opaque                            liquid). The total BBPS score equals 7. The quality                            of the bowel preparation was good. The ileocecal                            valve, appendiceal orifice, and rectum were                            photographed. Scope In: 1:02:06 PM Scope Out: 1:21:33 PM Scope Withdrawal Time: 0 hours 15 minutes 48 seconds  Total Procedure Duration: 0 hours 19 minutes 27 seconds  Findings:      Three small localized angiodysplastic lesions without bleeding were       found in the ascending colon. Coagulation for tissue destruction using        monopolar probe was successful. Estimated blood loss: none. To prevent       bleeding post-intervention, one hemostatic clip was successfully placed.       There was no bleeding at the end of the procedure.      Scattered small-mouthed diverticula were found in the sigmoid colon. Impression:               - Three non-bleeding colonic angiodysplastic                            lesions. Treated with a monopolar probe. Clip was                            placed.                           - Diverticulosis in the sigmoid colon.                           - No specimens collected. Recommendation:           - Return patient to hospital ward for ongoing care.                           -  Resume regular diet.                           - Continue present medications.                           - Follow HGB and transfuse if necessary.                           - Okay to restart Eliquis.                           - Follow up with Dr. Collene Mares in 2-4 weeks.                           - Signing off. Procedure Code(s):        --- Professional ---                           [phone], Colonoscopy, flexible; with ablation of                            tumor(s), polyp(s), or other lesion(s) (includes                            pre- and post-dilation and guide wire passage, when                            performed) Diagnosis Code(s):        --- Professional ---                           K55.20, Angiodysplasia of colon without hemorrhage                           R19.5, Other fecal abnormalities                           K92.1, Melena (includes Hematochezia)                           K57.30, Diverticulosis of large intestine without                            perforation or abscess without bleeding CPT copyright 2019 American Medical Association. All rights reserved. The codes documented in this report are preliminary and upon coder review may  be revised to meet current compliance requirements. Carol Ada,  MD Carol Ada, MD 01/12/2022 1:32:11 PM This report has been signed electronically. Number of Addenda: 0

## 2022-01-13 DIAGNOSIS — I5033 Acute on chronic diastolic (congestive) heart failure: Secondary | ICD-10-CM | POA: Diagnosis not present

## 2022-01-13 LAB — GLUCOSE, CAPILLARY
Glucose-Capillary: 148 mg/dL — ABNORMAL HIGH (ref 70–99)
Glucose-Capillary: 164 mg/dL — ABNORMAL HIGH (ref 70–99)
Glucose-Capillary: 162 mg/dL — ABNORMAL HIGH (ref 70–99)
Glucose-Capillary: 215 mg/dL — ABNORMAL HIGH (ref 70–99)

## 2022-01-13 LAB — CBC
HCT: 30.1 % — ABNORMAL LOW (ref 36.0–46.0)
Hemoglobin: 9.6 g/dL — ABNORMAL LOW (ref 12.0–15.0)
MCH: 33.2 pg (ref 26.0–34.0)
MCHC: 31.9 g/dL (ref 30.0–36.0)
MCV: 104.2 fL — ABNORMAL HIGH (ref 80.0–100.0)
Platelets: 334 10*3/uL (ref 150–400)
RBC: 2.89 MIL/uL — ABNORMAL LOW (ref 3.87–5.11)
RDW: 15.8 % — ABNORMAL HIGH (ref 11.5–15.5)
WBC: 11 10*3/uL — ABNORMAL HIGH (ref 4.0–10.5)
nRBC: 0.6 % — ABNORMAL HIGH (ref 0.0–0.2)

## 2022-01-13 LAB — BASIC METABOLIC PANEL
Anion gap: 10 (ref 5–15)
BUN: 36 mg/dL — ABNORMAL HIGH (ref 8–23)
CO2: 28 mmol/L (ref 22–32)
Calcium: 8.7 mg/dL — ABNORMAL LOW (ref 8.9–10.3)
Chloride: 104 mmol/L (ref 98–111)
Creatinine, Ser: 1.71 mg/dL — ABNORMAL HIGH (ref 0.44–1.00)
GFR, Estimated: 29 mL/min — ABNORMAL LOW (ref >60)
Glucose, Bld: 134 mg/dL — ABNORMAL HIGH (ref 70–99)
Potassium: 3.6 mmol/L (ref 3.5–5.1)
Sodium: 142 mmol/L (ref 135–145)

## 2022-01-13 MED ORDER — ACETAZOLAMIDE 250 MG PO TABS
500.0000 mg | ORAL_TABLET | Freq: Once | ORAL | Status: AC
Start: 2022-01-13 — End: 2022-01-13
  Administered 2022-01-13: 500 mg via ORAL
  Filled 2022-01-13: qty 2

## 2022-01-13 NOTE — TOC Initial Note (Signed)
Transition of Care [facility]) - Initial/Assessment Note    Patient Details  Name: Katelyn Mahoney MRN: [id number] Date of Birth: 1934/11/21  [facility]) CM/SW Contact:    Benard Halsted, LCSW Phone Number: 01/13/2022, 11:00 AM  Clinical Narrative:                 CSW received consult for possible SNF placement at time of discharge. CSW spoke with patient. Patient reported that patient's daughter is currently unable to care for patient at their home given patients current physical needs and fall risk. Patient expressed understanding of PT recommendation and is agreeable to SNF placement at time of discharge. Patient reports preference for [facility]. CSW reminded patient that TOC had tried to place patient at [facility] last month and they were not in network with her insurance. Patient stated that she received a letter from [facility] after she discharged that they had approved [facility] therefore she is asking for [facility] again this time. CSW discussed insurance authorization process and will provide Medicare SNF ratings list. Patient has received 4 COVID vaccines. CSW will send out referrals for review.   Skilled Nursing Rehab Facilities-   [email or web address]   Ratings out of 5 possible   Name Address  Phone # [place] Inspection Overall  [facility] [place] [address] [place] 5 5 2 4   [facility]  [address], [place] [phone] 4 2 5 5   [facility] [place], [place] 4 1 1 1   [facility] [place], [place] 2 2 4 4   [facility] [address], [place] 2 1 2 1   [facility] [address] [place] [phone] 3 1 4 3   [facility] [address], [place] 5 2 2 3   [facility] [address] [place] 4 1 2 1   [address] ([place]) [place], [place] [phone] 5 1 2 2   [facility] [phone], [place] [phone] 4 1 1 1   [facility] [address] [facility] [phone] 4 1 2 1   [facility] ([place]) [place]. [place], [place] [phone] 4 1 1 1           [place] [address], [place] 4 2 3 3   [facility] [place] [address], [place] 3 1 5 4   [address] [place], [place] [phone] 2 1 1 1   [facility] [address], [employer] [phone] 2 2 3 3           [address] (no [facility]) [address], [place] [phone] 4 5 5 5   [facility] (No Humana) [address], [place] 4 1 4 3   [facility] (No UHC) [place], [place] [phone] 5 5 5 5   [facility] [address], [place] [phone] 3 3 4 4   [place] [address], [place]  [phone] 2 2 2 2   [facility]ock [address] [place] [phone] 3 1 3 2   [place] [address], [place] 1 1 2 1   [facility] [address] [place] [phone] 2 1 1 1   [place], [place] 5 2 4 5   [facility] [address], [place] 2 1 1 1   [facility] [place] [address], [place] 3 1 1 1   [facility] [place] 2 1 2 1           [facility] [place] [address], [place] [phone] 5 3 3 4   [place] [address], [place] 2 1 1 1   [place] (No Humana) [address], [place] [phone] 2 1 2 1   [facility] [address], [place] [phone] 3 1 1 1           [facility] [place], [place] 4 1 5 4   [facility]) [place], [place] 2 1 3 2   [facility]) [place] [address] [place] 3 1 4 3   [place] [address] [place] 4 1 4 3   [address] [place], [name] 3 3 1 1   [facility]) [address] [place] [phone] 3 2 3 3      Expected Discharge Plan: [place] Barriers to Discharge: Continued Medical Work up, Ship broker, SNF Pending bed offer   Patient Goals and CMS Choice Patient states their goals for this hospitalization and ongoing recovery are:: Rehab CMS Medicare.gov Compare Post Acute Care list provided to:: Patient Choice offered to / list presented to : Patient  Expected Discharge Plan and Services Expected Discharge Plan: [place] In-house Referral: Clinical Social Work   Post Acute Care Choice: [place] Living arrangements for the past 2 months: [place]                                      Prior Living Arrangements/Services Living arrangements for the past 2 months: Single Family Home Lives with:: Adult Children Patient language and need for interpreter reviewed:: Yes Do you feel safe going back to the place where you live?: Yes      Need for Family Participation in Patient Care: Yes (Comment) Care giver support system in place?: Yes (comment) Current home services: DME Criminal Activity/Legal Involvement Pertinent to Current Situation/Hospitalization: No - Comment as needed  Activities of Daily Living Home Assistive Devices/Equipment: Environmental consultant (specify type), Built-in shower seat, Cane (specify quad or straight) ADL Screening (condition at time of admission) Patient's cognitive ability adequate to safely complete daily activities?: Yes Is the patient deaf or have difficulty hearing?: No Does the patient have difficulty seeing, even when wearing glasses/contacts?: No Does the patient have  difficulty concentrating, remembering, or making decisions?: No Patient able to express need for assistance with ADLs?: No Does the patient have difficulty dressing or bathing?: Yes Independently performs ADLs?: Yes (appropriate for developmental age) Does the patient have difficulty walking or climbing stairs?: Yes Weakness of Legs: Both Weakness of Arms/Hands: None  Permission Sought/Granted Permission sought to share information with : Facility Sport and exercise psychologist, Family Supports Permission granted to share information with : Yes, Verbal Permission Granted  Share Information with NAME: Mariann Laster  Permission granted to share info w AGENCY: SNFs  Permission granted to share info w Relationship: Daughter  Permission granted to share info w Contact Information: [phone]  Emotional Assessment Appearance:: Appears stated age Attitude/Demeanor/Rapport: Engaged Affect (typically observed): Accepting, Appropriate Orientation: : Oriented to Self, Oriented to Place, Oriented to  Time, Oriented to Situation Alcohol / Substance Use: Not  Applicable Psych Involvement: No (comment)  Admission diagnosis:  Acute diastolic congestive heart failure ([place]) [I50.31] AKI (acute kidney injury) ([place]) [N17.9] Acute on chronic respiratory failure ([place]) [J96.20] Gastrointestinal hemorrhage, unspecified gastrointestinal hemorrhage type [K92.2] Patient Active Problem List   Diagnosis Date Noted   Gastrointestinal hemorrhage    Persistent atrial fibrillation (HCC)    Acute exacerbation of CHF (congestive heart failure) ([place]) 01/09/2022   Atrial fibrillation with RVR (HCC)    Acute congestive heart failure ([place])    Urinary tract infection 12/13/2021   AKI (acute kidney injury) ([place]) 12/13/2021   At risk for sepsis 12/13/2021   Primary osteoarthritis of both knees 03/09/2021   Urinary frequency 03/09/2021   Depression, major, single episode, in partial remission ([place]) 03/09/2021   Edema of lower  extremity 08/20/2017   Hyperlipidemia 08/20/2017   CKD stage G3b/A1, GFR 30-44 and albumin creatinine ratio <30 mg/g (HCC) 02/27/2017   Allergic urticaria 08/02/2016   Fungal dermatitis 11/08/2015   Right leg pain 10/14/2015   Physical exam 11/09/2014   Dermatophytosis of nail 09/24/2013   Arthritis    Claw toe (acquired) 04/17/2013   Hammer toe, acquired 04/17/2013   Spherocytosis, hereditary ([place]) 08/30/2011   Diabetes mellitus ([place]) 10/17/2010   MORBID OBESITY 03/19/2008   Primary hypertension 03/19/2008   ROTATOR CUFF SPRAIN AND STRAIN 03/19/2008   Hyperlipidemia associated with type 2 diabetes mellitus ([place]) 09/05/2007   Depression 09/05/2007   ROSACEA 05/19/2007   PCP:  Ann Held, DO Pharmacy:   [facility] [place], [place] - [address] [place] [address] Phone: [phone] Fax: [phone]  [facility] - [place], [place] [address] [place] [address] Phone: [phone] Fax: [phone]     Social Determinants of Health (SDOH) Interventions    Readmission Risk Interventions No flowsheet data found.

## 2022-01-13 NOTE — NC FL2 (Signed)
[place] LEVEL OF CARE SCREENING TOOL     IDENTIFICATION  Patient Name: Katelyn Mahoney Birthdate: May 03, 1934 Sex: female Admission Date (Current Location): 01/08/2022  [facility] [place] Number:  Herbalist and Address:  [facility] [place], [place] [address] [place],  [address]      Provider Number: [id number]  Attending Physician Name and Address:  Axel Filler, *  Relative Name and Phone Number:       Current Level of Care: Hospital Recommended Level of Care: [place] Prior Approval Number:    Date Approved/Denied:   PASRR Number: [id number]  Discharge Plan: SNF    Current Diagnoses: Patient Active Problem List   Diagnosis Date Noted   Gastrointestinal hemorrhage    Persistent atrial fibrillation ([place])    Acute exacerbation of CHF (congestive heart failure) ([place]) 01/09/2022   Atrial fibrillation with RVR ([place])    Acute congestive heart failure ([place])    Urinary tract infection 12/13/2021   AKI (acute kidney injury) ([place]) 12/13/2021   At risk for sepsis 12/13/2021   Primary osteoarthritis of both knees 03/09/2021   Urinary frequency 03/09/2021   Depression, major, single episode, in partial remission ([place]) 03/09/2021   Edema of lower extremity 08/20/2017   Hyperlipidemia 08/20/2017   CKD stage G3b/A1, GFR 30-44 and albumin creatinine ratio <30 mg/g (HCC) 02/27/2017   Allergic urticaria 08/02/2016   Fungal dermatitis 11/08/2015   Right leg pain 10/14/2015   Physical exam 11/09/2014   Dermatophytosis of nail 09/24/2013   Arthritis    Claw toe (acquired) 04/17/2013   Hammer toe, acquired 04/17/2013   Spherocytosis, hereditary ([place]) 08/30/2011   Diabetes mellitus ([place]) 10/17/2010   MORBID OBESITY 03/19/2008   Primary hypertension 03/19/2008   ROTATOR CUFF SPRAIN AND STRAIN 03/19/2008   Hyperlipidemia associated with type 2 diabetes mellitus ([place]) 09/05/2007   Depression 09/05/2007    ROSACEA 05/19/2007    Orientation RESPIRATION BLADDER Height & Weight     Self, Time, Situation, Place  O2 (Nasal cannula 2L) Incontinent, External catheter Weight: (!) 300 lb 14.9 oz (136.5 kg) Height:  5\' 6"  (167.6 cm)  BEHAVIORAL SYMPTOMS/MOOD NEUROLOGICAL BOWEL NUTRITION STATUS      Continent Diet (see dc summary)  AMBULATORY STATUS COMMUNICATION OF NEEDS Skin   Extensive Assist Verbally PU Stage and Appropriate Care, Other (Comment) (Unstageable on toe w/guaze; unstageable on buttocks w/foam; wound on perineum)                       Personal Care Assistance Level of Assistance  Bathing, Feeding, Dressing Bathing Assistance: Maximum assistance Feeding assistance: Limited assistance Dressing Assistance: Limited assistance     Functional Limitations Info             SPECIAL CARE FACTORS FREQUENCY  PT (By licensed PT), OT (By licensed OT)     PT Frequency: 5x/week OT Frequency: 5x/week            Contractures Contractures Info: Not present    Additional Factors Info  Code Status, Allergies, Psychotropic, Insulin Sliding Scale Code Status Info: DNR Allergies Info: Bupropion Hcl, Penicillins, Citalopram Hydrobromide, Metformin And Related, Zinc, Blue Dyes (Parenteral), Cobalt, Gold-containing Drug Products, Nickel Psychotropic Info: Zoloft Insulin Sliding Scale Info: See dc summary       Current Medications (01/13/2022):  This is the current hospital active medication list Current Facility-Administered Medications  Medication Dose Route Frequency Provider Last Rate Last Admin   acetaminophen (  TYLENOL) tablet 650 mg  650 mg Oral Q6H PRN Carol Ada, MD       Or   acetaminophen (TYLENOL) suppository 650 mg  650 mg Rectal Q6H PRN Carol Ada, MD       apixaban [place]ne [place]) tablet 2.5 mg  2.5 mg Oral BID Gaylan Gerold, DO   2.5 mg at 01/12/22 2218   collagenase (SANTYL) ointment   Topical Daily Carol Ada, MD   Given at 01/12/22 0909   diltiazem (CARDIZEM)  tablet 30 mg  30 mg Oral Q6H Carol Ada, MD   30 mg at 01/13/22 6060   furosemide (LASIX) injection 80 mg  80 mg Intravenous BID Carol Ada, MD   80 mg at 01/13/22 0748   gabapentin (NEURONTIN) capsule 100 mg  100 mg Oral BID Carol Ada, MD   100 mg at 01/12/22 2218   insulin aspart (novoLOG) injection 0-20 Units  0-20 Units Subcutaneous TID [facility] Carol Ada, MD   3 Units at 01/13/22 0635   insulin glargine-yfgn (SEMGLEE) injection 17 Units  17 Units Subcutaneous QHS Carol Ada, MD   17 Units at 01/12/22 2218   metoprolol tartrate (LOPRESSOR) tablet 25 mg  25 mg Oral BID Carol Ada, MD   25 mg at 01/12/22 2218   pantoprazole (PROTONIX) injection 40 mg  40 mg Intravenous Q12H Carol Ada, MD   40 mg at 01/12/22 2218   sertraline (ZOLOFT) tablet 100 mg  100 mg Oral Daily Carol Ada, MD   100 mg at 01/12/22 0908   sodium chloride flush (NS) 0.9 % injection 3 mL  3 mL Intravenous Q12H Carol Ada, MD   3 mL at 01/12/22 2219     Discharge Medications: Please see discharge summary for a list of discharge medications.  Relevant Imaging Results:  Relevant Lab Results:   Additional Information SSN [id number]. [place] COVID-19 Vaccine 08/03/2021 , 10/19/2020 , 02/10/2020 , 01/20/2020  Benard Halsted, LCSW

## 2022-01-13 NOTE — Progress Notes (Signed)
HD#4 Subjective:  Overnight Events: no event  Patient was seen at bedside.  She appears comfortable in no acute respiratory distress.  She did not have a good night sleep.  She has no other acute issue.  Objective:  Vital signs in last 24 hours: Vitals:   01/13/22 0000 01/13/22 0326 01/13/22 0400 01/13/22 0416  BP: (!) 132/98 (!) 131/97 115/78   Pulse: 91 99 (!) 103 95  Resp: 18 20 20    Temp:  98.1 F (36.7 C) 97.9 F (36.6 C)   TempSrc:  Oral Oral   SpO2: 95% 96% 98%   Weight:  (!) 136.7 kg (!) 136.5 kg   Height:       Supplemental O2: Nasal Cannula SpO2: 98 % O2 Flow Rate (L/min): 2 L/min   Physical Exam:  Physical Exam Constitutional:      General: She is not in acute distress.    Appearance: She is not ill-appearing.  HENT:     Head: Normocephalic.  Eyes:     General:        Right eye: No discharge.        Left eye: No discharge.     Conjunctiva/sclera: Conjunctivae normal.  Cardiovascular:     Rate and Rhythm: Normal rate and regular rhythm.  Pulmonary:     Effort: Pulmonary effort is normal.     Comments: On 2 L nasal cannula.  Anterior lung sounds clear. Musculoskeletal:     Comments: Bilateral lower extremity wrapped.  +1 edema.  Skin:    General: Skin is warm.     Coloration: Skin is not jaundiced.  Neurological:     Mental Status: She is alert.  Psychiatric:        Mood and Affect: Mood normal.        Behavior: Behavior normal.    Filed Weights   01/12/22 1155 01/13/22 0326 01/13/22 0400  Weight: (!) 137 kg (!) 136.7 kg (!) 136.5 kg     Intake/Output Summary (Last 24 hours) at 01/13/2022 0611 Last data filed at 01/13/2022 0540 Gross per 24 hour  Intake 685 ml  Output 1650 ml  Net -965 ml   Net IO Since Admission: -491 mL [01/13/22 0611]  Pertinent Labs: CBC Latest Ref Rng & Units 01/13/2022 01/12/2022 01/11/2022  WBC 4.0 - 10.5 K/uL 11.0(H) 9.8 9.7  Hemoglobin 12.0 - 15.0 g/dL 9.6(L) 9.9(L) 9.2(L)  Hematocrit 36.0 - 46.0 % 30.1(L)  30.4(L) 28.9(L)  Platelets 150 - 400 K/uL 334 313 305    CMP Latest Ref Rng & Units 01/13/2022 01/12/2022 01/11/2022  Glucose 70 - 99 mg/dL 134(H) 139(H) 146(H)  BUN 8 - 23 mg/dL 36(H) 40(H) 46(H)  Creatinine 0.44 - 1.00 mg/dL 1.71(H) 1.87(H) 1.97(H)  Sodium 135 - 145 mmol/L 142 138 136  Potassium 3.5 - 5.1 mmol/L 3.6 3.4(L) 3.0(L)  Chloride 98 - 111 mmol/L 104 102 101  CO2 22 - 32 mmol/L 28 28 27   Calcium 8.9 - 10.3 mg/dL 8.7(L) 8.5(L) 8.3(L)  Total Protein 6.5 - 8.1 g/dL - - -  Total Bilirubin 0.3 - 1.2 mg/dL - - -  Alkaline Phos 38 - 126 U/L - - -  AST 15 - 41 U/L - - -  ALT 0 - 44 U/L - - -    Imaging: No results found.  Assessment/Plan:   Principal Problem:   Acute exacerbation of CHF (congestive heart failure) (HCC) Active Problems:   AKI (acute kidney injury) ([place])   Gastrointestinal hemorrhage  Persistent atrial fibrillation [facility])   Patient Summary: Katelyn Mahoney is a 88 y.o. with a pertinent PMH of Eliquis, HFpEF, chronic respiratory failure on 4 L, obesity who was hospitalized for acute HFpEF exacerbation and acute on chronic anemia.  #Acute on chronic HFpEF exacerbation #Chronic respiratory failure, on 3-4 L at home She put out 1600 cc last night with net -1 L.  However her weight did not change much.  Still have +1 edema bilateral lower extremity.  Her dry weight from last hospitalization was 293.  Current weight 300 pounds. - Add acetazolamide 500 mg to the diuresis regimen - Continue IV lasix 80mg  BID - Strict I&O'd and daily weights - Trend BMP  - Unna boots    #Acute on chronic macrocytic anemia #Nonbleeding AVM EGD showed a single nonbleeding angiodysplastic lesion in the duodenum that was treated with monopolar probe.  Colonoscopy showed 3 nonbleeding AVMs that was cauterized.  Hemoglobin stable today - Resume Eliquis - Plan for colonoscopy today, 01/13 - IV protonix 40mg  bid  #Atrial fibrillation Currently rate controlled. - Diltiazem 30mg   q6h  - Metoprolol 25mg  bid  - Resume Eliquis - Cardiac monitoring    #AKI on CKD3b Suspect cardiorenal.  Creatinine continues to trend down with diuresis. - Continue IV Lasix - Trend renal function - Strict I&O as above - Avoid nephrotoxic agents    #Diabetes mellitus with peripheral neuropathy  Patient is on 34U basal insulin and 6u tid meal time coverage at home. Most recent HbA1c 7.4.  CBG yesterday at goal - Semglee 17U nightly - SSI with meals - Continue home gabapentin  - CBG monitoring   Diet: Normal IVF: None,None VTE: NOAC Code: DNR PT/OT recs: SNF for Subacute PT, none. TOC recs: Pending SNF   Dispo: Anticipated discharge to Skilled nursing facility in 3 days pending diuresis and SNF placement.   Gaylan Gerold, DO 01/13/2022, 6:11 AM Pager: [phone]  Please contact the on call pager after 5 pm and on weekends at [phone].

## 2022-01-13 NOTE — Plan of Care (Signed)
  Problem: Education: Goal: Knowledge of General Education information will improve Description: Including pain rating scale, medication(s)/side effects and non-pharmacologic comfort measures Outcome: Progressing   Problem: Clinical Measurements: Goal: Ability to maintain clinical measurements within normal limits will improve Outcome: Progressing   Problem: Clinical Measurements: Goal: Diagnostic test results will improve Outcome: Progressing   

## 2022-01-14 LAB — GLUCOSE, CAPILLARY
Glucose-Capillary: 174 mg/dL — ABNORMAL HIGH (ref 70–99)
Glucose-Capillary: 222 mg/dL — ABNORMAL HIGH (ref 70–99)
Glucose-Capillary: 173 mg/dL — ABNORMAL HIGH (ref 70–99)
Glucose-Capillary: 180 mg/dL — ABNORMAL HIGH (ref 70–99)

## 2022-01-14 LAB — CBC
HCT: 30.3 % — ABNORMAL LOW (ref 36.0–46.0)
Hemoglobin: 9.5 g/dL — ABNORMAL LOW (ref 12.0–15.0)
MCH: 33.1 pg (ref 26.0–34.0)
MCHC: 31.4 g/dL (ref 30.0–36.0)
MCV: 105.6 fL — ABNORMAL HIGH (ref 80.0–100.0)
Platelets: 319 10*3/uL (ref 150–400)
RBC: 2.87 MIL/uL — ABNORMAL LOW (ref 3.87–5.11)
RDW: 15.8 % — ABNORMAL HIGH (ref 11.5–15.5)
WBC: 9.5 10*3/uL (ref 4.0–10.5)
nRBC: 1.1 % — ABNORMAL HIGH (ref 0.0–0.2)

## 2022-01-14 LAB — BASIC METABOLIC PANEL
Anion gap: 9 (ref 5–15)
BUN: 32 mg/dL — ABNORMAL HIGH (ref 8–23)
CO2: 30 mmol/L (ref 22–32)
Calcium: 8.6 mg/dL — ABNORMAL LOW (ref 8.9–10.3)
Chloride: 101 mmol/L (ref 98–111)
Creatinine, Ser: 1.96 mg/dL — ABNORMAL HIGH (ref 0.44–1.00)
GFR, Estimated: 25 mL/min — ABNORMAL LOW (ref >60)
Glucose, Bld: 174 mg/dL — ABNORMAL HIGH (ref 70–99)
Potassium: 3.2 mmol/L — ABNORMAL LOW (ref 3.5–5.1)
Sodium: 140 mmol/L (ref 135–145)

## 2022-01-14 MED ORDER — POTASSIUM CHLORIDE CRYS ER 20 MEQ PO TBCR
40.0000 meq | EXTENDED_RELEASE_TABLET | Freq: Once | ORAL | Status: AC
  Administered 2022-01-14: 40 meq via ORAL
  Filled 2022-01-14: qty 2

## 2022-01-14 MED ORDER — INSULIN ASPART 100 UNIT/ML IJ SOLN
3.0000 [IU] | Freq: Three times a day (TID) | INTRAMUSCULAR | Status: DC
  Administered 2022-01-14 – 2022-01-19 (×13): 3 [IU] via SUBCUTANEOUS

## 2022-01-14 NOTE — Progress Notes (Addendum)
HD#5 Subjective:  Overnight Events: No events overnight  Patient was seen at bedside. She appears comfortable in no acute respiratory distress. She did not have a good night sleep. She has no other acute issues at this time.  Objective:  Vital signs in last 24 hours: Vitals:   01/13/22 1131 01/13/22 1132 01/13/22 1956 01/14/22 0306  BP: (!) 111/55  (!) 116/51 112/62  Pulse: 72  95 92  Resp: 20  (!) 22 (!) 22  Temp: 98.7 F (37.1 C)  97.8 F (36.6 C) 98.1 F (36.7 C)  TempSrc: Oral  Oral Oral  SpO2: 98% 96% 95% 93%  Weight:    (!) 137.8 kg  Height:       Supplemental O2: Nasal Cannula SpO2: 93 % O2 Flow Rate (L/min): 2 L/min   Physical Exam:  Physical Exam Constitutional:      General: She is not in acute distress.    Appearance: She is not ill-appearing.  HENT:     Head: Normocephalic.  Eyes:     General:        Right eye: No discharge.        Left eye: No discharge.     Conjunctiva/sclera: Conjunctivae normal.  Cardiovascular:     Rate and Rhythm: Normal rate and regular rhythm.  Pulmonary:     Effort: Pulmonary effort is normal.     Comments: On 2 L nasal cannula.  Anterior lung sounds clear. Musculoskeletal:     Comments: Bilateral lower extremity wrapped.  +1 edema.  Skin:    General: Skin is warm.     Coloration: Skin is not jaundiced.  Neurological:     Mental Status: She is alert.  Psychiatric:        Mood and Affect: Mood normal.        Behavior: Behavior normal.    Filed Weights   01/13/22 0326 01/13/22 0400 01/14/22 0306  Weight: (!) 136.7 kg (!) 136.5 kg (!) 137.8 kg     Intake/Output Summary (Last 24 hours) at 01/14/2022 0703 Last data filed at 01/14/2022 0308 Gross per 24 hour  Intake 493 ml  Output 600 ml  Net -107 ml    Net IO Since Admission: -358 mL [01/14/22 0703]  Pertinent Labs: CBC Latest Ref Rng & Units 01/14/2022 01/13/2022 01/12/2022  WBC 4.0 - 10.5 K/uL 9.5 11.0(H) 9.8  Hemoglobin 12.0 - 15.0 g/dL 9.5(L) 9.6(L)  9.9(L)  Hematocrit 36.0 - 46.0 % 30.3(L) 30.1(L) 30.4(L)  Platelets 150 - 400 K/uL 319 334 313    CMP Latest Ref Rng & Units 01/14/2022 01/13/2022 01/12/2022  Glucose 70 - 99 mg/dL 174(H) 134(H) 139(H)  BUN 8 - 23 mg/dL 32(H) 36(H) 40(H)  Creatinine 0.44 - 1.00 mg/dL 1.96(H) 1.71(H) 1.87(H)  Sodium 135 - 145 mmol/L 140 142 138  Potassium 3.5 - 5.1 mmol/L 3.2(L) 3.6 3.4(L)  Chloride 98 - 111 mmol/L 101 104 102  CO2 22 - 32 mmol/L 30 28 28   Calcium 8.9 - 10.3 mg/dL 8.6(L) 8.7(L) 8.5(L)  Total Protein 6.5 - 8.1 g/dL - - -  Total Bilirubin 0.3 - 1.2 mg/dL - - -  Alkaline Phos 38 - 126 U/L - - -  AST 15 - 41 U/L - - -  ALT 0 - 44 U/L - - -    Imaging: No results found.  Assessment/Plan:   Principal Problem:   Acute exacerbation of CHF (congestive heart failure) (HCC) Active Problems:   AKI (acute kidney injury) ([place])  Gastrointestinal hemorrhage   Persistent atrial fibrillation [facility])    Patient Summary: Katelyn Mahoney is a 88 y.o. with a pertinent PMH of Afib on Eliquis, HFpEF, CKD stage 3b, chronic respiratory failure on 4 L, obesity who was hospitalized for acute HFpEF exacerbation and acute on chronic anemia.   #Acute on chronic HFpEF exacerbation #Chronic respiratory failure, on 3-4 L at home She put out 600 cc + one unmeasured void last night. Weight has slowly been up-trending however. Her dry weight from last hospitalization was 293. Current weight 302 pounds. Still has +1 edema in her bilateral lower extremities, although has had significant clinical improvement since admit.  - Start metolazone 5 mg daily, start 30 min before next Lasix dose - DC acetazolamide - Continue IV lasix 80mg  BID - Strict I&O'd and daily weights - Trend BMP  - Unna boots    #Acute on chronic macrocytic anemia #Nonbleeding AVM EGD showed a single nonbleeding angiodysplastic lesion in the duodenum that was treated with monopolar probe. Colonoscopy showed 3 nonbleeding AVMs that was  cauterized. Hemoglobin stable today. - Continue Eliquis - IV protonix 40mg  bid  #Atrial fibrillation Currently rate controlled. - Diltiazem 30mg  q6h  - Metoprolol 25mg  bid  - Continue Eliquis - Cardiac monitoring    #AKI on CKD3b Suspect cardiorenal. Creatinine bumped up to 1.96 although overall continues to trend down with diuresis.  Bicarb 30, up from the high 20s range since admit. - Continue IV Lasix - Trend renal function - Strict I&O as above - Avoid nephrotoxic agents    #Diabetes mellitus with peripheral neuropathy  Patient is on 34U basal insulin and 6u tid meal time coverage at home. Most recent HbA1c 7.4. CBGs in the 200s most recently. - Semglee 17U nightly - Added novolog 3u tid with meals - SSI with meals - Continue home gabapentin  - CBG monitoring  Diet: Normal IVF: None,None VTE: NOAC Code: DNR PT/OT recs: SNF for Subacute PT, none TOC recs: Pending SNF   Dispo: Anticipated discharge to Skilled nursing facility in 1-2 days pending diuresis and SNF placement.   Orvis Brill, MD 01/14/2022, 7:03 AM Pager: [phone]  Please contact the on call pager after 5 pm and on weekends at [phone].

## 2022-01-14 NOTE — Plan of Care (Signed)
  Problem: Health Behavior/Discharge Planning: Goal: Ability to manage health-related needs will improve Outcome: Progressing   Problem: Clinical Measurements: Goal: Ability to maintain clinical measurements within normal limits will improve Outcome: Progressing   Problem: Clinical Measurements: Goal: Diagnostic test results will improve Outcome: Progressing   

## 2022-01-15 ENCOUNTER — Other Ambulatory Visit: Payer: Self-pay

## 2022-01-15 ENCOUNTER — Encounter (HOSPITAL_COMMUNITY): Payer: Self-pay | Admitting: Gastroenterology

## 2022-01-15 DIAGNOSIS — I5033 Acute on chronic diastolic (congestive) heart failure: Secondary | ICD-10-CM | POA: Diagnosis not present

## 2022-01-15 LAB — BASIC METABOLIC PANEL
Anion gap: 7 (ref 5–15)
BUN: 32 mg/dL — ABNORMAL HIGH (ref 8–23)
CO2: 29 mmol/L (ref 22–32)
Calcium: 8.5 mg/dL — ABNORMAL LOW (ref 8.9–10.3)
Chloride: 104 mmol/L (ref 98–111)
Creatinine, Ser: 2.07 mg/dL — ABNORMAL HIGH (ref 0.44–1.00)
GFR, Estimated: 23 mL/min — ABNORMAL LOW (ref >60)
Glucose, Bld: 146 mg/dL — ABNORMAL HIGH (ref 70–99)
Potassium: 3.5 mmol/L (ref 3.5–5.1)
Sodium: 140 mmol/L (ref 135–145)

## 2022-01-15 LAB — CBC
HCT: 30.2 % — ABNORMAL LOW (ref 36.0–46.0)
Hemoglobin: 9.5 g/dL — ABNORMAL LOW (ref 12.0–15.0)
MCH: 33.5 pg (ref 26.0–34.0)
MCHC: 31.5 g/dL (ref 30.0–36.0)
MCV: 106.3 fL — ABNORMAL HIGH (ref 80.0–100.0)
Platelets: 331 10*3/uL (ref 150–400)
RBC: 2.84 MIL/uL — ABNORMAL LOW (ref 3.87–5.11)
RDW: 15.9 % — ABNORMAL HIGH (ref 11.5–15.5)
WBC: 9.8 10*3/uL (ref 4.0–10.5)
nRBC: 0.7 % — ABNORMAL HIGH (ref 0.0–0.2)

## 2022-01-15 LAB — GLUCOSE, CAPILLARY
Glucose-Capillary: 149 mg/dL — ABNORMAL HIGH (ref 70–99)
Glucose-Capillary: 225 mg/dL — ABNORMAL HIGH (ref 70–99)
Glucose-Capillary: 130 mg/dL — ABNORMAL HIGH (ref 70–99)
Glucose-Capillary: 109 mg/dL — ABNORMAL HIGH (ref 70–99)

## 2022-01-15 MED ORDER — POTASSIUM CHLORIDE CRYS ER 20 MEQ PO TBCR
40.0000 meq | EXTENDED_RELEASE_TABLET | Freq: Once | ORAL | Status: AC
  Administered 2022-01-15: 40 meq via ORAL
  Filled 2022-01-15: qty 2

## 2022-01-15 MED ORDER — FUROSEMIDE 10 MG/ML IJ SOLN
120.0000 mg | Freq: Two times a day (BID) | INTRAVENOUS | Status: DC
  Filled 2022-01-15: qty 12

## 2022-01-15 MED ORDER — PANTOPRAZOLE SODIUM 40 MG PO TBEC
40.0000 mg | DELAYED_RELEASE_TABLET | Freq: Two times a day (BID) | ORAL | Status: DC
  Administered 2022-01-15 – 2022-01-26 (×22): 40 mg via ORAL
  Filled 2022-01-15 (×23): qty 1

## 2022-01-15 NOTE — Progress Notes (Signed)
Physical Therapy Treatment Patient Details Name: Katelyn Mahoney MRN: [id number] DOB: 09-05-1934 Today's Date: 01/15/2022   History of Present Illness Pt is an 88 y.o. female who presented 01/08/22 with SOB and weight gain. Pt admitted for acute on chronic HFpEF exacerbation. Pt also with melenic stools/heme positive, plan for EGD 01/10/22. S/p colonoscopy 1/13. PMH includes DM II, HTN, obesity, CKD, HLD, chronic LE edema, chronic hypoxic respiratory failure on 4L oxygen at home, Afib, HfpEF    PT Comments    Pt reports that she worked with OT earlier and that she is hungry because her lunch never came so she does not feel like she can get up and move around but she is very agreeable to seated exercises. "I know I need to do it to get better." Pt becomes SoB with activity, and needs cuing for slowed breathing however is able to maintain SpO2 >90%O2 on 5 L O2 via [place]. PT recommends SNF level rehab prior to return home. PT will continue to follow acutely.    Recommendations for follow up therapy are one component of a multi-disciplinary discharge planning process, led by the attending physician.  Recommendations may be updated based on patient status, additional functional criteria and insurance authorization.  Follow Up Recommendations  Skilled nursing-short term rehab (<3 hours/day)     Assistance Recommended at Discharge Intermittent Supervision/Assistance  Patient can return home with the following A little help with walking and/or transfers;A little help with bathing/dressing/bathroom;Assistance with cooking/housework;Assist for transportation;Help with stairs or ramp for entrance   Equipment Recommendations  Other (comment) (shower seat)       Precautions / Restrictions Precautions Precautions: Fall;Other (comment) Precaution Comments: monitor SpO2, 4L O2 at baseline Restrictions Weight Bearing Restrictions: No     Mobility  Bed Mobility               General bed mobility  comments: up in chair    Transfers Overall transfer level: Needs assistance Equipment used: Rolling walker (2 wheels) Transfers: Sit to/from Stand Sit to Stand: Min guard           General transfer comment: pt reports that she worked with OT earlier and that she is very tired but would do some exercises                     Balance Overall balance assessment: Needs assistance Sitting-balance support: No upper extremity supported Sitting balance-Leahy Scale: Fair     Standing balance support: During functional activity;Reliant on assistive device for balance;Bilateral upper extremity supported Standing balance-Leahy Scale: Poor Standing balance comment: relies on UE support                            Cognition Arousal/Alertness: Awake/alert Behavior During Therapy: WFL for tasks assessed/performed Overall Cognitive Status: Within Functional Limits for tasks assessed                                          Exercises General Exercises - Lower Extremity Ankle Circles/Pumps: AROM;Both;Supine;20 reps;Seated Quad Sets: AROM;Strengthening;Both;10 reps;Seated (legs elevated) Gluteal Sets: AROM;Both;Seated Long Arc Quad: AROM;Both;10 reps;Seated Hip ABduction/ADduction: AROM;Both;10 reps;Seated Hip Flexion/Marching: AROM;Both;10 reps;Seated Toe Raises: AROM;Both;10 reps;Seated Heel Raises: AROM;Both;10 reps;Seated    General Comments General comments (skin integrity, edema, etc.): Pt on 5L O2 via [place], SpO2 >90%O2 but becomes SoB and requires cuing for  slowed breathing      Pertinent Vitals/Pain Pain Assessment: Faces Faces Pain Scale: Hurts a little bit Pain Location: generalized with AROM Pain Descriptors / Indicators: Discomfort;Grimacing;Guarding Pain Intervention(s): Limited activity within patient's tolerance;Monitored during session;Repositioned     PT Goals (current goals can now be found in the care plan section) Acute Rehab  PT Goals Patient Stated Goal: to go to SNF at this time PT Goal Formulation: With patient Time For Goal Achievement: 01/24/22 Potential to Achieve Goals: Good Progress towards PT goals: Progressing toward goals    Frequency    Min 3X/week      PT Plan Current plan remains appropriate       AM-PAC PT "6 Clicks" Mobility   Outcome Measure  Help needed turning from your back to your side while in a flat bed without using bedrails?: A Little Help needed moving from lying on your back to sitting on the side of a flat bed without using bedrails?: A Little Help needed moving to and from a bed to a chair (including a wheelchair)?: A Little Help needed standing up from a chair using your arms (e.g., wheelchair or bedside chair)?: A Little Help needed to walk in hospital room?: A Lot Help needed climbing 3-5 steps with a railing? : Total 6 Click Score: 15    End of Session Equipment Utilized During Treatment: Oxygen Activity Tolerance: Patient limited by fatigue Patient left: with call bell/phone within reach;in chair;with chair alarm set Nurse Communication: Mobility status PT Visit Diagnosis: Muscle weakness (generalized) (M62.81);Unsteadiness on feet (R26.81);Other abnormalities of gait and mobility (R26.89);Difficulty in walking, not elsewhere classified (R26.2)     Time: 1610-9604 PT Time Calculation (min) (ACUTE ONLY): 20 min  Charges:  $Therapeutic Exercise: 8-22 mins                     Sareena Odeh B. Migdalia Dk PT, DPT Acute Rehabilitation Services Pager [phone] Office [phone]    [place] 01/15/2022, 5:03 PM

## 2022-01-15 NOTE — Progress Notes (Signed)
Occupational Therapy Treatment Patient Details Name: Katelyn Mahoney MRN: [id number] DOB: 1934/09/13 Today's Date: 01/15/2022   History of present illness Pt is an 88 y.o. female who presented 01/08/22 with SOB and weight gain. Pt admitted for acute on chronic HFpEF exacerbation. Pt also with melenic stools/heme positive, plan for EGD 01/10/22. S/p colonoscopy 1/13. PMH includes DM II, HTN, obesity, CKD, HLD, chronic LE edema, chronic hypoxic respiratory failure on 4L oxygen at home, Afib, HfpEF   OT comments  Patient continues to make steady progress towards goals in skilled OT session. Patient's session encompassed seated ADLs, sit<>stands and marching in place from recliner, and education with regard to energy conservation strategies. Patient with excellent recall of energy conservation strategies, as well as adaptive equipment. Patient able to complete 3 sit<>stands with marching in place with prolonged rest breaks with O2 saturations remaining above 92%. Respiratory rate noted to get upwards of 38 after exercise bout, but demonstrated excellent pursed lip breathing to bring RR down to 14-16. Continue with discharge recommendation, however if patient continues to progress, may be upgraded to [facility]. OT will continue to follow.    Recommendations for follow up therapy are one component of a multi-disciplinary discharge planning process, led by the attending physician.  Recommendations may be updated based on patient status, additional functional criteria and insurance authorization.    Follow Up Recommendations  Skilled nursing-short term rehab (<3 hours/day)    Assistance Recommended at Discharge Intermittent Supervision/Assistance  Patient can return home with the following  A little help with walking and/or transfers;A little help with bathing/dressing/bathroom;Assistance with cooking/housework   Equipment Recommendations  None recommended by OT    Recommendations for Other Services       Precautions / Restrictions Precautions Precautions: Fall;Other (comment) Precaution Comments: monitor SpO2, 4L O2 at baseline Restrictions Weight Bearing Restrictions: No       Mobility Bed Mobility               General bed mobility comments: Up in chair upon arrival    Transfers Overall transfer level: Needs assistance Equipment used: Rolling walker (2 wheels) Transfers: Sit to/from Stand Sit to Stand: Min guard                 Balance Overall balance assessment: Needs assistance Sitting-balance support: No upper extremity supported;Feet supported Sitting balance-Leahy Scale: Good     Standing balance support: During functional activity;Reliant on assistive device for balance;Bilateral upper extremity supported Standing balance-Leahy Scale: Poor Standing balance comment: relies on UE support                           ADL either performed or assessed with clinical judgement   ADL Overall ADL's : Needs assistance/impaired Eating/Feeding: Modified independent   Grooming: Wash/dry hands;Wash/dry face;Oral care;Set up               Lower Body Dressing: Total assistance Lower Body Dressing Details (indicate cue type and reason): assist for socks but uses sock aide at home Toilet Transfer: Min guard;Ambulation Toilet Transfer Details (indicate cue type and reason): simulated in room, using RW         Functional mobility during ADLs: Min guard;Rolling walker (2 wheels) General ADL Comments: patient completing seated ADLs, but multiple sit<>stands from the recliner in order to increase overall activity tolerance, patient with excellent carry over of pursed lip breathing and energy conservation strategies    Extremity/Trunk Assessment  Vision       Perception     Praxis      Cognition Arousal/Alertness: Awake/alert Behavior During Therapy: WFL for tasks assessed/performed Overall Cognitive Status: Within Functional  Limits for tasks assessed                                            Exercises     Shoulder Instructions       General Comments      Pertinent Vitals/ Pain       Pain Assessment: Faces Faces Pain Scale: Hurts a little bit Pain Location: generalized with AROM Pain Descriptors / Indicators: Discomfort;Grimacing;Guarding Pain Intervention(s): Limited activity within patient's tolerance;Monitored during session;Repositioned  Home Living                                          Prior Functioning/Environment              Frequency  Min 2X/week        Progress Toward Goals  OT Goals(current goals can now be found in the care plan section)  Progress towards OT goals: Progressing toward goals  Acute Rehab OT Goals Patient Stated Goal: get stronger OT Goal Formulation: With patient Time For Goal Achievement: 01/24/22 Potential to Achieve Goals: Good  Plan Discharge plan remains appropriate;Frequency remains appropriate    Co-evaluation                 AM-PAC OT "6 Clicks" Daily Activity     Outcome Measure   Help from another person eating meals?: None Help from another person taking care of personal grooming?: A Little Help from another person toileting, which includes using toliet, bedpan, or urinal?: A Little Help from another person bathing (including washing, rinsing, drying)?: A Lot Help from another person to put on and taking off regular upper body clothing?: A Little Help from another person to put on and taking off regular lower body clothing?: A Lot 6 Click Score: 17    End of Session Equipment Utilized During Treatment: Oxygen;Rolling walker (2 wheels) (2L)  OT Visit Diagnosis: Unsteadiness on feet (R26.81);Muscle weakness (generalized) (M62.81)   Activity Tolerance Patient tolerated treatment well   Patient Left in chair;with call bell/phone within reach   Nurse Communication Mobility status;Other  (comment) (No lunch tray)        Time: 0923-3007 OT Time Calculation (min): 25 min  Charges: OT General Charges $OT Visit: 1 Visit OT Treatments $Therapeutic Activity: 23-37 mins  [place]. Rolanda Campa, OTR/L Acute Rehabilitation Services [phone] [place] 01/15/2022, 2:58 PM

## 2022-01-15 NOTE — Progress Notes (Signed)
Mobility Specialist Progress Note    01/15/22 1821  Mobility  Activity Ambulated in hall  Level of Assistance Contact guard assist, steadying assist  Assistive Device Front wheel walker  Distance Ambulated (ft) 50 ft  Mobility Ambulated with assistance in hallway  Mobility Response Tolerated fair  Mobility performed by Mobility specialist  Bed Position Chair  $Mobility charge 1 Mobility   Pt received in chair and agreeable. Ambulated on 6LO2. SOB with exertion. Took x1 seated rest break. Returned to [facility] and had successful BM then left in chair with call bell in reach.   [facility] Mobility Specialist  M.S. 2C and 6E: [phone] M.S. 4E: [phone]

## 2022-01-15 NOTE — Progress Notes (Addendum)
HD#6 Subjective:  Overnight Events: No events overnight  Patient was seen at bedside. She states that she would like to get the COVID booster soon.  She feels like the swelling in her legs is better. She also feels less short of breath compared to yesterday.  She has no other acute issues at this time.  Objective:  Vital signs in last 24 hours: Vitals:   01/14/22 1126 01/14/22 2117 01/15/22 0017 01/15/22 0303  BP:  136/86 (!) 127/51 (!) 100/55  Pulse:  75 78 93  Resp: 16 17 20 19   Temp:  (!) 97.4 F (36.3 C) (!) 97.5 F (36.4 C) 97.7 F (36.5 C)  TempSrc:  Oral Oral Oral  SpO2:  94%  97%  Weight:    (!) 137.4 kg  Height:       Supplemental O2: Nasal Cannula SpO2: 97 % O2 Flow Rate (L/min): 2 L/min   Physical Exam:  Physical Exam Constitutional:      General: She is not in acute distress.    Appearance: She is not ill-appearing.  HENT:     Head: Normocephalic.  Eyes:     General:        Right eye: No discharge.        Left eye: No discharge.     Conjunctiva/sclera: Conjunctivae normal.  Cardiovascular:     Rate and Rhythm: Normal rate and regular rhythm.  Pulmonary:     Effort: Pulmonary effort is normal.     Comments: On 2 L nasal cannula.  Anterior lung sounds clear. Musculoskeletal:     Comments: Bilateral lower extremity wrapped.  +1 edema.  Skin:    General: Skin is warm.     Coloration: Skin is not jaundiced.  Neurological:     Mental Status: She is alert.  Psychiatric:        Mood and Affect: Mood normal.        Behavior: Behavior normal.    Filed Weights   01/13/22 0400 01/14/22 0306 01/15/22 0303  Weight: (!) 136.5 kg (!) 137.8 kg (!) 137.4 kg     Intake/Output Summary (Last 24 hours) at 01/15/2022 0608 Last data filed at 01/15/2022 0307 Gross per 24 hour  Intake 1060 ml  Output 850 ml  Net 210 ml    Net IO Since Admission: -148 mL [01/15/22 0608]  Pertinent Labs: CBC Latest Ref Rng & Units 01/15/2022 01/14/2022 01/13/2022  WBC 4.0 -  10.5 K/uL 9.8 9.5 11.0(H)  Hemoglobin 12.0 - 15.0 g/dL 9.5(L) 9.5(L) 9.6(L)  Hematocrit 36.0 - 46.0 % 30.2(L) 30.3(L) 30.1(L)  Platelets 150 - 400 K/uL 331 319 334    CMP Latest Ref Rng & Units 01/15/2022 01/14/2022 01/13/2022  Glucose 70 - 99 mg/dL 146(H) 174(H) 134(H)  BUN 8 - 23 mg/dL 32(H) 32(H) 36(H)  Creatinine 0.44 - 1.00 mg/dL 2.07(H) 1.96(H) 1.71(H)  Sodium 135 - 145 mmol/L 140 140 142  Potassium 3.5 - 5.1 mmol/L 3.5 3.2(L) 3.6  Chloride 98 - 111 mmol/L 104 101 104  CO2 22 - 32 mmol/L 29 30 28   Calcium 8.9 - 10.3 mg/dL 8.5(L) 8.6(L) 8.7(L)  Total Protein 6.5 - 8.1 g/dL - - -  Total Bilirubin 0.3 - 1.2 mg/dL - - -  Alkaline Phos 38 - 126 U/L - - -  AST 15 - 41 U/L - - -  ALT 0 - 44 U/L - - -    Imaging: No results found.  Assessment/Plan:   Principal Problem:  Acute exacerbation of CHF (congestive heart failure) (HCC) Active Problems:   AKI (acute kidney injury) ([place])   Gastrointestinal hemorrhage   Persistent atrial fibrillation [facility])   Patient Summary: Katelyn Mahoney is a 88 y.o. with a pertinent PMH of Afib on Eliquis, HFpEF, CKD stage 3b, chronic respiratory failure on 4 L, obesity who was hospitalized for acute HFpEF exacerbation and acute on chronic anemia.   #Acute on chronic HFpEF exacerbation #Chronic respiratory failure, on 3-4 L at home She put out 850 cc + two unmeasured voids last night. Weight has stable. Her dry weight from last hospitalization was 293. Current weight is 302 pounds. Still has +1 edema in her bilateral lower extremities, although has had significant clinical improvement since admit. Volume status has been difficulty to assess, but given that creatinine is also uptrending, will hold off on further diuresis for now. - Hold Lasix for now - Strict I&O's and daily weights - Trend BMP  - Unna boots    #Acute on chronic macrocytic anemia #Nonbleeding AVM EGD showed a single nonbleeding angiodysplastic lesion in the duodenum that was  treated with monopolar probe. Colonoscopy showed 3 nonbleeding AVMs that was cauterized. Hemoglobin is stable today. - Continue Eliquis - Protonix 40mg  po bid   #Atrial fibrillation Currently rate controlled. - Diltiazem 30mg  q6h  - Metoprolol 25mg  bid  - Continue Eliquis - Cardiac monitoring    #AKI on CKD3b Suspect cardiorenal. Creatinine bumped up to 2.07 today, otherwise has been downtrending overall since admit. - Continue IV Lasix - Trend renal function - Strict I&O as above - Avoid nephrotoxic agents    #Diabetes mellitus with peripheral neuropathy  Patient is on 34U basal insulin and 6u tid meal time coverage at home. Most recent HbA1c 7.4. CBGs in the 200s most recently. - Semglee 17U nightly - Continue novolog 3u tid with meals - SSI with meals - Continue home gabapentin  - CBG monitoring  Diet: Normal IVF: None,None VTE: NOAC Code: DNR PT/OT recs: SNF for Subacute PT, none TOC recs: Pending SNF   Dispo: Anticipated discharge to Skilled nursing facility in 1-2 days pending diuresis and SNF placement.   Katelyn Brill, MD 01/15/2022, 6:08 AM Pager: [phone]  Please contact the on call pager after 5 pm and on weekends at [phone].

## 2022-01-15 NOTE — Progress Notes (Signed)
Orthopedic Tech Progress Note Patient Details:  Katelyn Mahoney Dec 30, 1934 [id number]  Ortho Devices Type of Ortho Device: Louretta Parma boot Ortho Device/Splint Location: BLE Ortho Device/Splint Interventions: Ordered, Application   Post Interventions Patient Tolerated: Well Instructions Provided: Care of device  [name] 01/15/2022, 4:31 PM

## 2022-01-16 DIAGNOSIS — I5033 Acute on chronic diastolic (congestive) heart failure: Secondary | ICD-10-CM | POA: Diagnosis not present

## 2022-01-16 LAB — BASIC METABOLIC PANEL
Anion gap: 8 (ref 5–15)
BUN: 32 mg/dL — ABNORMAL HIGH (ref 8–23)
CO2: 28 mmol/L (ref 22–32)
Calcium: 8.6 mg/dL — ABNORMAL LOW (ref 8.9–10.3)
Chloride: 105 mmol/L (ref 98–111)
Creatinine, Ser: 2.03 mg/dL — ABNORMAL HIGH (ref 0.44–1.00)
GFR, Estimated: 24 mL/min — ABNORMAL LOW (ref >60)
Glucose, Bld: 155 mg/dL — ABNORMAL HIGH (ref 70–99)
Potassium: 3.9 mmol/L (ref 3.5–5.1)
Sodium: 141 mmol/L (ref 135–145)

## 2022-01-16 LAB — CBC
HCT: 31.4 % — ABNORMAL LOW (ref 36.0–46.0)
Hemoglobin: 9.5 g/dL — ABNORMAL LOW (ref 12.0–15.0)
MCH: 32.3 pg (ref 26.0–34.0)
MCHC: 30.3 g/dL (ref 30.0–36.0)
MCV: 106.8 fL — ABNORMAL HIGH (ref 80.0–100.0)
Platelets: 317 10*3/uL (ref 150–400)
RBC: 2.94 MIL/uL — ABNORMAL LOW (ref 3.87–5.11)
RDW: 15.7 % — ABNORMAL HIGH (ref 11.5–15.5)
WBC: 10.8 10*3/uL — ABNORMAL HIGH (ref 4.0–10.5)
nRBC: 0.9 % — ABNORMAL HIGH (ref 0.0–0.2)

## 2022-01-16 LAB — GLUCOSE, CAPILLARY
Glucose-Capillary: 160 mg/dL — ABNORMAL HIGH (ref 70–99)
Glucose-Capillary: 205 mg/dL — ABNORMAL HIGH (ref 70–99)
Glucose-Capillary: 163 mg/dL — ABNORMAL HIGH (ref 70–99)
Glucose-Capillary: 134 mg/dL — ABNORMAL HIGH (ref 70–99)

## 2022-01-16 MED ORDER — FUROSEMIDE 40 MG PO TABS
80.0000 mg | ORAL_TABLET | Freq: Every day | ORAL | Status: DC
  Administered 2022-01-16 – 2022-01-17 (×2): 80 mg via ORAL
  Filled 2022-01-16 (×2): qty 2

## 2022-01-16 NOTE — Progress Notes (Signed)
Mobility Specialist Progress Note:   01/16/22 1123  Mobility  Bed Position Chair  Activity Ambulated with assistance in room  Level of Assistance Minimal assist, patient does 75% or more  Assistive Device Front wheel walker  Distance Ambulated (ft) 56 ft  Activity Response Tolerated well  $Mobility charge 1 Mobility   Pt received in chair willing to participate in mobility. No complaints of pain. Required 1 seated break d/t being SOB. Pt left in chair with call bell in reach and all needs met.   [facility] Public librarian Phone [phone] Secondary Phone [phone]

## 2022-01-16 NOTE — Plan of Care (Signed)
  Problem: Nutrition: Goal: Adequate nutrition will be maintained Outcome: Progressing   Problem: Coping: Goal: Level of anxiety will decrease Outcome: Progressing   Problem: Elimination: Goal: Will not experience complications related to bowel motility Outcome: Progressing Goal: Will not experience complications related to urinary retention Outcome: Progressing   

## 2022-01-16 NOTE — Progress Notes (Signed)
HD#7 Subjective:  Overnight Events: No events overnight  Patient was seen at bedside. She is seated in chair.  She is on 4L which is what she uses at home.  States that her shortness of breath has been stable.  She thinks that her swelling is improved overall. She has no other acute issues at this time.  Objective:  Vital signs in last 24 hours: Vitals:   01/15/22 1450 01/15/22 2025 01/16/22 0044 01/16/22 0419  BP:  (!) 132/92 (!) 108/43 (!) 127/55  Pulse:  (!) 101 (!) 53 (!) 50  Resp:  18  (!) 25  Temp:  98.2 F (36.8 C)  98.2 F (36.8 C)  TempSrc:  Oral  Oral  SpO2: 98% 99%  97%  Weight:    (!) 136.4 kg  Height:       Supplemental O2: Nasal Cannula SpO2: 97 % O2 Flow Rate (L/min): 2 L/min   Physical Exam:  Physical Exam Constitutional:      General: She is not in acute distress.    Appearance: She is not ill-appearing.  HENT:     Head: Normocephalic.  Eyes:     General:        Right eye: No discharge.        Left eye: No discharge.     Conjunctiva/sclera: Conjunctivae normal.  Cardiovascular:     Rate and Rhythm: Normal rate and regular rhythm.  Pulmonary:     Effort: Pulmonary effort is normal.     Comments: On 2 L nasal cannula.  Anterior lung sounds clear. Musculoskeletal:     Comments: Bilateral lower extremity wrapped.  +1 edema.  Skin:    General: Skin is warm.     Coloration: Skin is not jaundiced.  Neurological:     Mental Status: She is alert.  Psychiatric:        Mood and Affect: Mood normal.        Behavior: Behavior normal.    Filed Weights   01/14/22 0306 01/15/22 0303 01/16/22 0419  Weight: (!) 137.8 kg (!) 137.4 kg (!) 136.4 kg     Intake/Output Summary (Last 24 hours) at 01/16/2022 0614 Last data filed at 01/16/2022 0300 Gross per 24 hour  Intake 823 ml  Output 600 ml  Net 223 ml    Net IO Since Admission: 75 mL [01/16/22 0614]  Pertinent Labs: CBC Latest Ref Rng & Units 01/16/2022 01/15/2022 01/14/2022  WBC 4.0 - 10.5 K/uL  10.8(H) 9.8 9.5  Hemoglobin 12.0 - 15.0 g/dL 9.5(L) 9.5(L) 9.5(L)  Hematocrit 36.0 - 46.0 % 31.4(L) 30.2(L) 30.3(L)  Platelets 150 - 400 K/uL 317 331 319    CMP Latest Ref Rng & Units 01/16/2022 01/15/2022 01/14/2022  Glucose 70 - 99 mg/dL 155(H) 146(H) 174(H)  BUN 8 - 23 mg/dL 32(H) 32(H) 32(H)  Creatinine 0.44 - 1.00 mg/dL 2.03(H) 2.07(H) 1.96(H)  Sodium 135 - 145 mmol/L 141 140 140  Potassium 3.5 - 5.1 mmol/L 3.9 3.5 3.2(L)  Chloride 98 - 111 mmol/L 105 104 101  CO2 22 - 32 mmol/L 28 29 30   Calcium 8.9 - 10.3 mg/dL 8.6(L) 8.5(L) 8.6(L)  Total Protein 6.5 - 8.1 g/dL - - -  Total Bilirubin 0.3 - 1.2 mg/dL - - -  Alkaline Phos 38 - 126 U/L - - -  AST 15 - 41 U/L - - -  ALT 0 - 44 U/L - - -    Imaging: No results found.  Assessment/Plan:   Principal Problem:  Acute exacerbation of CHF (congestive heart failure) (HCC) Active Problems:   AKI (acute kidney injury) ([place])   Gastrointestinal hemorrhage   Persistent atrial fibrillation [facility])   Patient Summary: Katelyn Mahoney is a 88 y.o. with a pertinent PMH of Afib on Eliquis, HFpEF, CKD stage 3b, chronic respiratory failure on 4 L, obesity who was hospitalized for acute HFpEF exacerbation and acute on chronic anemia.   #Acute on chronic HFpEF exacerbation #Chronic respiratory failure, on 3-4 L at home She put out 600 cc + three unmeasured voids last night. Her dry weight from last hospitalization was 293. Current weight is 300 pounds. Still has +1 edema in her bilateral lower extremities, although has had significant clinical improvement since admit.  Will transition to oral Lasix 80 mg daily at this time and assess her clinical response.  Otherwise, we are awaiting SNF placement. - Transition to oral Lasix 80 mg daily today - Strict I&O's and daily weights - Trend BMP  - Unna boots    #Acute on chronic macrocytic anemia #Nonbleeding AVM EGD showed a single nonbleeding angiodysplastic lesion in the duodenum that was treated  with monopolar probe. Colonoscopy showed 3 nonbleeding AVMs that was cauterized. Hemoglobin is stable today. - Continue Eliquis - Protonix 40mg  po bid   #Atrial fibrillation Currently rate controlled. - Diltiazem 30mg  q6h  - Metoprolol 25mg  bid  - Continue Eliquis - Cardiac monitoring    #AKI on CKD3b Suspect cardiorenal. Creatinine is 2.03 today, otherwise has been downtrending overall since admit with diuresis. - Continue oral Lasix - Trend renal function - Strict I&O as above - Avoid nephrotoxic agents    #Diabetes mellitus with peripheral neuropathy  Patient is on 34U basal insulin and 6u tid meal time coverage at home. Most recent HbA1c 7.4. CBGs are well-controlled at this time. - Semglee 17U nightly - Continue novolog 3u tid with meals - SSI with meals - Continue home gabapentin  - CBG monitoring  Diet: Normal IVF: None,None VTE: NOAC Code: DNR PT/OT recs: SNF for Subacute PT, none TOC recs: Pending SNF   Dispo: Anticipated discharge to Skilled nursing facility in 1-2 days pending diuresis and SNF placement.   Orvis Brill, MD 01/16/2022, 6:14 AM Pager: [phone]  Please contact the on call pager after 5 pm and on weekends at [phone].

## 2022-01-16 NOTE — Care Management Important Message (Signed)
Important Message  Patient Details  Name: Katelyn Mahoney MRN: [id number] Date of Birth: 1934/01/03   Medicare Important Message Given:  Yes     Shelda Altes 01/16/2022, 8:08 AM

## 2022-01-17 DIAGNOSIS — I5033 Acute on chronic diastolic (congestive) heart failure: Secondary | ICD-10-CM | POA: Diagnosis not present

## 2022-01-17 LAB — GLUCOSE, CAPILLARY
Glucose-Capillary: 155 mg/dL — ABNORMAL HIGH (ref 70–99)
Glucose-Capillary: 183 mg/dL — ABNORMAL HIGH (ref 70–99)
Glucose-Capillary: 141 mg/dL — ABNORMAL HIGH (ref 70–99)
Glucose-Capillary: 252 mg/dL — ABNORMAL HIGH (ref 70–99)

## 2022-01-17 LAB — BASIC METABOLIC PANEL
Anion gap: 9 (ref 5–15)
BUN: 34 mg/dL — ABNORMAL HIGH (ref 8–23)
CO2: 28 mmol/L (ref 22–32)
Calcium: 8.7 mg/dL — ABNORMAL LOW (ref 8.9–10.3)
Chloride: 103 mmol/L (ref 98–111)
Creatinine, Ser: 1.99 mg/dL — ABNORMAL HIGH (ref 0.44–1.00)
GFR, Estimated: 24 mL/min — ABNORMAL LOW (ref >60)
Glucose, Bld: 141 mg/dL — ABNORMAL HIGH (ref 70–99)
Potassium: 4 mmol/L (ref 3.5–5.1)
Sodium: 140 mmol/L (ref 135–145)

## 2022-01-17 LAB — CBC
HCT: 31.2 % — ABNORMAL LOW (ref 36.0–46.0)
Hemoglobin: 9.6 g/dL — ABNORMAL LOW (ref 12.0–15.0)
MCH: 33 pg (ref 26.0–34.0)
MCHC: 30.8 g/dL (ref 30.0–36.0)
MCV: 107.2 fL — ABNORMAL HIGH (ref 80.0–100.0)
Platelets: 286 10*3/uL (ref 150–400)
RBC: 2.91 MIL/uL — ABNORMAL LOW (ref 3.87–5.11)
RDW: 15.5 % (ref 11.5–15.5)
WBC: 11 10*3/uL — ABNORMAL HIGH (ref 4.0–10.5)
nRBC: 1.2 % — ABNORMAL HIGH (ref 0.0–0.2)

## 2022-01-17 LAB — MAGNESIUM: Magnesium: 2 mg/dL (ref 1.7–2.4)

## 2022-01-17 MED ORDER — FUROSEMIDE 40 MG PO TABS
80.0000 mg | ORAL_TABLET | Freq: Two times a day (BID) | ORAL | Status: DC
  Administered 2022-01-17 – 2022-01-19 (×4): 80 mg via ORAL
  Filled 2022-01-17 (×4): qty 2

## 2022-01-17 NOTE — Progress Notes (Signed)
Heart Failure Navigator Progress Note  Assessed for Heart & Vascular TOC clinic readiness.  Pt declined HV TOC clinic appt upon DC from hospitalization. Pt educated on benefits of HV TOC. Pt education complete regarding HF patient booklet.     Navigator available for reassessment of patient.   Pricilla Holm, MSN, RN Heart Failure Nurse Navigator [phone]

## 2022-01-17 NOTE — TOC Progression Note (Signed)
Transition of Care [facility]) - Progression Note    Patient Details  Name: Katelyn Mahoney MRN: [id number] Date of Birth: [birth year]  [facility] CM/SW Contact  Reece Agar, [place] Phone Number: 01/17/2022, 3:21 PM  Clinical Narrative:    CSW spoke with Linus Orn at [facility] for bed availability. There is no beds at [facility], CSW contacted pt and she stated she has to have [facility] and she is not going to any other facility she will have to go home with [facility]. CSW has updated the NCM.   Expected Discharge Plan: [place] Barriers to Discharge: Continued Medical Work up, Ship broker, SNF Pending bed offer  Expected Discharge Plan and Services Expected Discharge Plan: [place] In-house Referral: Clinical Social Work   Post Acute Care Choice: [place] Living arrangements for the past 2 months: Single Family Home                                       Social Determinants of Health (SDOH) Interventions    Readmission Risk Interventions No flowsheet data found.

## 2022-01-17 NOTE — Progress Notes (Signed)
Physical Therapy Treatment Patient Details Name: Katelyn Mahoney MRN: [id number] DOB: 1934/11/21 Today's Date: 01/17/2022   History of Present Illness Pt is an 88 y.o. female who presented 01/08/22 with SOB and weight gain. Pt admitted for acute on chronic HFpEF exacerbation. Pt also with melenic stools/heme positive, plan for EGD 01/10/22. S/p colonoscopy 1/13. PMH includes DM II, HTN, obesity, CKD, HLD, chronic LE edema, chronic hypoxic respiratory failure on 4L oxygen at home, Afib, HfpEF    PT Comments    Pt is making good, steady progress with mobility again, ambulating 16 ft 2x at a min guard assist level using a RW. She is primarily limited by fatigue, but maintains SpO2 >/= 98% on 4.5 L O2 via [place]. Encouraged lower extremity exercises to address her weakness impacting her ease with transfers. Will continue to follow acutely. Per chart, pt refusing the SNFs that had a bed offer available and instead wishes to d/c home. If pt has the level of assistance necessary to care for her at home she can d/c home with HHPT.    Recommendations for follow up therapy are one component of a multi-disciplinary discharge planning process, led by the attending physician.  Recommendations may be updated based on patient status, additional functional criteria and insurance authorization.  Follow Up Recommendations  Skilled nursing-short term rehab (<3 hours/day) (if has level of assistance necessary pt can go home with HHPT)     Assistance Recommended at Discharge Intermittent Supervision/Assistance  Patient can return home with the following A little help with walking and/or transfers;A little help with bathing/dressing/bathroom;Assistance with cooking/housework;Assist for transportation;Help with stairs or ramp for entrance   Equipment Recommendations  Other (comment) (shower seat)    Recommendations for Other Services       Precautions / Restrictions Precautions Precautions: Fall;Other  (comment) Precaution Comments: monitor SpO2, 4L O2 at baseline Restrictions Weight Bearing Restrictions: No     Mobility  Bed Mobility               General bed mobility comments: up in chair    Transfers Overall transfer level: Needs assistance Equipment used: Rolling walker (2 wheels) Transfers: Sit to/from Stand Sit to Stand: Min guard           General transfer comment: Extra time and pt rocking to gain momentum, pushing up from chair to stand at RW 3x, min guard for safety, no LOB.    Ambulation/Gait Ambulation/Gait assistance: Min guard Gait Distance (Feet): 16 Feet (x2 bouts of ~16 ft each) Assistive device: Rolling walker (2 wheels) Gait Pattern/deviations: Step-through pattern, Wide base of support, Decreased stride length Gait velocity: reduced Gait velocity interpretation: <1.31 ft/sec, indicative of household ambulator   General Gait Details: Pt with short, wide steps within room to fatigue, SpO2 >/= 89% on 4.5 L O2. No LOB, min guard for safety and line management.   Stairs             Wheelchair Mobility    Modified Rankin (Stroke Patients Only)       Balance Overall balance assessment: Needs assistance Sitting-balance support: No upper extremity supported Sitting balance-Leahy Scale: Fair     Standing balance support: During functional activity, Reliant on assistive device for balance, Bilateral upper extremity supported Standing balance-Leahy Scale: Poor Standing balance comment: relies on UE support                            Cognition Arousal/Alertness: Awake/alert Behavior During  Therapy: WFL for tasks assessed/performed Overall Cognitive Status: Within Functional Limits for tasks assessed                                          Exercises      General Comments General comments (skin integrity, edema, etc.): SpO2 >/= 89% on 4.5L O2; encouraged pt to perform ankle pumps and quad sets while in  chair as HEP      Pertinent Vitals/Pain Pain Assessment Pain Assessment: Faces Faces Pain Scale: Hurts a little bit Pain Location: generalized with mobility Pain Descriptors / Indicators: Discomfort, Grimacing, Guarding Pain Intervention(s): Limited activity within patient's tolerance, Monitored during session, Repositioned    Home Living                          Prior Function            PT Goals (current goals can now be found in the care plan section) Acute Rehab PT Goals Patient Stated Goal: to go home PT Goal Formulation: With patient Time For Goal Achievement: 01/24/22 Potential to Achieve Goals: Good Progress towards PT goals: Progressing toward goals    Frequency    Min 3X/week      PT Plan Current plan remains appropriate    Co-evaluation              AM-PAC PT "6 Clicks" Mobility   Outcome Measure  Help needed turning from your back to your side while in a flat bed without using bedrails?: A Little Help needed moving from lying on your back to sitting on the side of a flat bed without using bedrails?: A Little Help needed moving to and from a bed to a chair (including a wheelchair)?: A Little Help needed standing up from a chair using your arms (e.g., wheelchair or bedside chair)?: A Little Help needed to walk in hospital room?: A Lot Help needed climbing 3-5 steps with a railing? : Total 6 Click Score: 15    End of Session Equipment Utilized During Treatment: Oxygen Activity Tolerance: Patient limited by fatigue Patient left: with call bell/phone within reach;in chair;with nursing/sitter in room Nurse Communication: Mobility status;Other (comment) (sats) PT Visit Diagnosis: Muscle weakness (generalized) (M62.81);Unsteadiness on feet (R26.81);Other abnormalities of gait and mobility (R26.89);Difficulty in walking, not elsewhere classified (R26.2)     Time: 0962-8366 PT Time Calculation (min) (ACUTE ONLY): 19 min  Charges:  $Gait  Training: 8-22 mins                     Moishe Spice, PT, DPT Acute Rehabilitation Services  Pager: [phone] Office: [place] 01/17/2022, 5:48 PM

## 2022-01-17 NOTE — Progress Notes (Signed)
Mobility Specialist Progress Note:   01/17/22 0946  Mobility  Bed Position Chair  Activity Ambulated with assistance in room  Level of Assistance Standby assist, set-up cues, supervision of patient - no hands on  Assistive Device Front wheel walker  Distance Ambulated (ft) 56 ft  Activity Response Tolerated well  $Mobility charge 1 Mobility   During Mobility: 122 HR  Pt received in chair willing to participate in mobility. No complaints of pain. Pt SOB, instructed on pursed lip breathing. Pt left in chair with call bell in reach and all needs met.   [facility] Public librarian Phone [phone] Secondary Phone [phone]

## 2022-01-17 NOTE — TOC Progression Note (Signed)
Transition of Care [facility]) - Progression Note    Patient Details  Name: Waneta Fitting MRN: [id number] Date of Birth: 10/26/1934  Transition of Care [facility] CM/SW Contact  Zenon Mayo, RN Phone Number: 01/17/2022, 3:55 PM  Clinical Narrative:    NCM was notified by CSW that patient is refusing the SNF's that has offered beds for her, she wants to go [facility] with [facility].  NCM offered choice , she chose [facility] for [facility], HHPT, HHAIDE and Education officer, museum.  NCM [facility] to [facility] and she is able to take referral.  Soc will begin 24 to 48 hrs post dc.  Patient states she has a rollator , rolling walker and home oxygen (4) liters at home. She lives with her daughter who will transport her home at discharge.  NCM informed MD of this plan and the need for [facility] orders.    Expected Discharge Plan: [place] Barriers to Discharge: Continued Medical Work up  Expected Discharge Plan and Services Expected Discharge Plan: [place] In-house Referral: Clinical Social Work   Post Acute Care Choice: [place] Living arrangements for the past 2 months: [place]                   DME Agency: NA       HH Arranged: Therapist, sports, PT, Nurse's Aide, Social Work [employer] Agency: [place] Date [place]: 01/17/22 Time [place]: [place] Representative spoke with at [place]: [place] Determinants of Health ([place]) Interventions    Readmission Risk Interventions No flowsheet data found.

## 2022-01-17 NOTE — Progress Notes (Addendum)
HD#8 Subjective:  Overnight Events: No events overnight  Patient was seen at bedside the AM. She is seated in chair.  She is on 2L which is what she uses at home.  She feels somewhat more short of breath today with walking.  She reports that her swelling has improved overall, however. She had difficulty sleeping last night because of nose being stopped up. She has no other acute issues at this time.  Objective:  Vital signs in last 24 hours: Vitals:   01/16/22 2030 01/16/22 2203 01/17/22 0034 01/17/22 0430  BP: (!) 120/50  121/60 (!) 116/58  Pulse: (!) 52 (!) 106  62  Resp: 20   20  Temp: 97.9 F (36.6 C)   97.9 F (36.6 C)  TempSrc: Oral   Oral  SpO2: 97%   96%  Weight:    (!) 138.6 kg  Height:       Supplemental O2: Nasal Cannula SpO2: 96 % O2 Flow Rate (L/min): 2 L/min   Physical Exam:  Physical Exam Constitutional:      General: She is not in acute distress.    Appearance: She is not ill-appearing.  HENT:     Head: Normocephalic.  Eyes:     General:        Right eye: No discharge.        Left eye: No discharge.     Conjunctiva/sclera: Conjunctivae normal.  Cardiovascular:     Rate and Rhythm: Normal rate and regular rhythm.  Pulmonary:     Effort: Pulmonary effort is normal.     Comments: On 2 L nasal cannula.  Anterior lung sounds clear. Musculoskeletal:     Comments: Bilateral lower extremity wrapped.  +1 edema.  Skin:    General: Skin is warm.     Coloration: Skin is not jaundiced.  Neurological:     Mental Status: She is alert.  Psychiatric:        Mood and Affect: Mood normal.        Behavior: Behavior normal.    Filed Weights   01/15/22 0303 01/16/22 0419 01/17/22 0430  Weight: (!) 137.4 kg (!) 136.4 kg (!) 138.6 kg     Intake/Output Summary (Last 24 hours) at 01/17/2022 0717 Last data filed at 01/17/2022 0500 Gross per 24 hour  Intake 354 ml  Output 600 ml  Net -246 ml    Net IO Since Admission: -171 mL [01/17/22 0717]  Pertinent  Labs: CBC Latest Ref Rng & Units 01/17/2022 01/16/2022 01/15/2022  WBC 4.0 - 10.5 K/uL 11.0(H) 10.8(H) 9.8  Hemoglobin 12.0 - 15.0 g/dL 9.6(L) 9.5(L) 9.5(L)  Hematocrit 36.0 - 46.0 % 31.2(L) 31.4(L) 30.2(L)  Platelets 150 - 400 K/uL 286 317 331    CMP Latest Ref Rng & Units 01/17/2022 01/16/2022 01/15/2022  Glucose 70 - 99 mg/dL 141(H) 155(H) 146(H)  BUN 8 - 23 mg/dL 34(H) 32(H) 32(H)  Creatinine 0.44 - 1.00 mg/dL 1.99(H) 2.03(H) 2.07(H)  Sodium 135 - 145 mmol/L 140 141 140  Potassium 3.5 - 5.1 mmol/L 4.0 3.9 3.5  Chloride 98 - 111 mmol/L 103 105 104  CO2 22 - 32 mmol/L 28 28 29   Calcium 8.9 - 10.3 mg/dL 8.7(L) 8.6(L) 8.5(L)  Total Protein 6.5 - 8.1 g/dL - - -  Total Bilirubin 0.3 - 1.2 mg/dL - - -  Alkaline Phos 38 - 126 U/L - - -  AST 15 - 41 U/L - - -  ALT 0 - 44 U/L - - -  Imaging: No results found.  Assessment/Plan:   Principal Problem:   Acute exacerbation of CHF (congestive heart failure) (HCC) Active Problems:   AKI (acute kidney injury) ([place])   Gastrointestinal hemorrhage   Persistent atrial fibrillation (HCC)   Patient Summary: Katelyn Mahoney is a 88 y.o. with a pertinent PMH of Afib on Eliquis, HFpEF, CKD stage 3b, chronic respiratory failure on 4 L, obesity who was hospitalized for acute HFpEF exacerbation and acute on chronic anemia.   #Acute on chronic HFpEF exacerbation #Chronic respiratory failure, on 3-4 L at home She put out 600 cc last night. Her dry weight from last hospitalization was 293. Current weight is 304 pounds. Though she has had significant clinical improvement since admit, she still has +1 edema in her bilateral lower extremities and SOB.  Will increase her diuretic regimen to 80 mg twice daily today and assess her clinical response. Otherwise, we are awaiting SNF placement. - Transition to oral Lasix 80 mg BID today - Strict I&O's and daily weights - Trend BMP  - Unna boots    #Acute on chronic macrocytic anemia #Nonbleeding AVM EGD  showed a single nonbleeding angiodysplastic lesion in the duodenum that was treated with monopolar probe. Colonoscopy showed 3 nonbleeding AVMs that was cauterized. Hemoglobin is stable today. - Continue Eliquis - Protonix 40mg  po bid   #Atrial fibrillation Currently rate controlled. - Diltiazem 30mg  q6h  - Metoprolol 25mg  bid  - Continue Eliquis - Cardiac monitoring    #AKI on CKD3b Suspect cardiorenal. Creatinine is 1.99 today but otherwise has been downtrending overall with diuresis since admit. - Continue oral Lasix - Trend renal function - Strict I&O as above - Avoid nephrotoxic agents    #Diabetes mellitus with peripheral neuropathy  Patient is on 34U basal insulin and 6u tid meal time coverage at home. Most recent HbA1c 7.4. CBGs are well-controlled at this time. - Semglee 17U nightly - Continue novolog 3u tid with meals - SSI with meals - Continue home gabapentin  - CBG monitoring  Diet: Normal IVF: None,None VTE: NOAC Code: DNR PT/OT recs: SNF for Subacute PT, none TOC recs: Pending SNF   Dispo: Anticipated discharge to Skilled nursing facility in 1-2 days pending diuresis and SNF placement.   Orvis Brill, MD 01/17/2022, 7:17 AM Pager: [phone] Please contact the on call pager after 5 pm and on weekends at [phone].

## 2022-01-18 ENCOUNTER — Ambulatory Visit: Payer: Medicare Other | Admitting: Cardiology

## 2022-01-18 LAB — CBC
HCT: 29.8 % — ABNORMAL LOW (ref 36.0–46.0)
Hemoglobin: 8.7 g/dL — ABNORMAL LOW (ref 12.0–15.0)
MCH: 31.6 pg (ref 26.0–34.0)
MCHC: 29.2 g/dL — ABNORMAL LOW (ref 30.0–36.0)
MCV: 108.4 fL — ABNORMAL HIGH (ref 80.0–100.0)
Platelets: 342 10*3/uL (ref 150–400)
RBC: 2.75 MIL/uL — ABNORMAL LOW (ref 3.87–5.11)
RDW: 15.2 % (ref 11.5–15.5)
WBC: 10.2 10*3/uL (ref 4.0–10.5)
nRBC: 1.1 % — ABNORMAL HIGH (ref 0.0–0.2)

## 2022-01-18 LAB — BASIC METABOLIC PANEL
Anion gap: 7 (ref 5–15)
BUN: 35 mg/dL — ABNORMAL HIGH (ref 8–23)
CO2: 31 mmol/L (ref 22–32)
Calcium: 8.5 mg/dL — ABNORMAL LOW (ref 8.9–10.3)
Chloride: 102 mmol/L (ref 98–111)
Creatinine, Ser: 2.09 mg/dL — ABNORMAL HIGH (ref 0.44–1.00)
GFR, Estimated: 23 mL/min — ABNORMAL LOW (ref >60)
Glucose, Bld: 165 mg/dL — ABNORMAL HIGH (ref 70–99)
Potassium: 3.4 mmol/L — ABNORMAL LOW (ref 3.5–5.1)
Sodium: 140 mmol/L (ref 135–145)

## 2022-01-18 LAB — GLUCOSE, CAPILLARY
Glucose-Capillary: 169 mg/dL — ABNORMAL HIGH (ref 70–99)
Glucose-Capillary: 219 mg/dL — ABNORMAL HIGH (ref 70–99)
Glucose-Capillary: 192 mg/dL — ABNORMAL HIGH (ref 70–99)
Glucose-Capillary: 172 mg/dL — ABNORMAL HIGH (ref 70–99)

## 2022-01-18 LAB — PHOSPHORUS: Phosphorus: 3.4 mg/dL (ref 2.5–4.6)

## 2022-01-18 LAB — MAGNESIUM: Magnesium: 2 mg/dL (ref 1.7–2.4)

## 2022-01-18 MED ORDER — POTASSIUM CHLORIDE 20 MEQ PO PACK
40.0000 meq | PACK | Freq: Two times a day (BID) | ORAL | Status: AC
  Administered 2022-01-18 (×2): 40 meq via ORAL
  Filled 2022-01-18 (×2): qty 2

## 2022-01-18 NOTE — Plan of Care (Signed)

## 2022-01-18 NOTE — TOC Progression Note (Signed)
Transition of Care [facility]) - Progression Note    Patient Details  Name: Katelyn Mahoney MRN: [id number] Date of Birth: [birth year]  Transition of [facility]) CM/SW Contact  Zenon Mayo, RN Phone Number: 01/18/2022, 1:02 PM  Clinical Narrative:    Per CSW patient states she is going to need ptar transport home at discharge.    Expected Discharge Plan: [place] Barriers to Discharge: Continued Medical Work up  Expected Discharge Plan and Services Expected Discharge Plan: [place] In-house Referral: Clinical Social Work   Post Acute Care Choice: [place] Living arrangements for the past 2 months: [place]                   DME Agency: NA       HH Arranged: Therapist, sports, PT, Nurse's Aide, Social Work [employer] Agency: [place] Date [place]: 01/17/22 Time [place]: [place] Representative spoke with at [place]: [place] Determinants of Health ([place]) Interventions    Readmission Risk Interventions No flowsheet data found.

## 2022-01-18 NOTE — Progress Notes (Signed)
°   01/18/22 1340  Vitals  Temp (!) 97.3 F (36.3 C)  Temp Source Oral  BP (!) 116/41  MAP (mmHg) (!) 64  BP Method Automatic  Patient Position (if appropriate) Sitting  Pulse Rate 64  Pulse Rate Source Radial  ECG Heart Rate 87  Resp 12  MEWS COLOR  MEWS Score Color Green  Oxygen Therapy  SpO2 100 %  O2 Device Nasal Cannula  O2 Flow Rate (L/min) 2 L/min  Pain Assessment  Pain Scale 0-10  Pain Score 1  MEWS Score  MEWS Temp 0  MEWS Systolic 0  MEWS Pulse 0  MEWS RR 1  MEWS LOC 0  MEWS Score 1   New Vital checked with appropriate b/p cuff and HR better after Diltiazem given

## 2022-01-18 NOTE — Progress Notes (Signed)
Occupational Therapy Treatment Patient Details Name: Katelyn Mahoney MRN: [id number] DOB: 1934/05/01 Today's Date: 01/18/2022   History of present illness Pt is an 88 y.o. female who presented 01/08/22 with SOB and weight gain. Pt admitted for acute on chronic HFpEF exacerbation. Pt also with melenic stools/heme positive, plan for EGD 01/10/22. S/p colonoscopy 1/13. PMH includes DM II, HTN, obesity, CKD, HLD, chronic LE edema, chronic hypoxic respiratory failure on 4L oxygen at home, Afib, HfpEF   OT comments  Pt progressing towards acute OT goals. Focus of session was functional transfers and bathroom distance mobility. Pt with DOE 2/4, recovered within 2 minutes seated rest break. Pt verbalized concern regarding 3 steps to enter house. OT reached out to LCSW to inquire about options, appreciate their assistance.    Recommendations for follow up therapy are one component of a multi-disciplinary discharge planning process, led by the attending physician.  Recommendations may be updated based on patient status, additional functional criteria and insurance authorization.    Follow Up Recommendations  Skilled nursing-short term rehab (<3 hours/day) . Noted that pt plans to d/c home.   Assistance Recommended at Discharge Intermittent Supervision/Assistance  Patient can return home with the following  Help with stairs or ramp for entrance;A little help with bathing/dressing/bathroom;A little help with walking and/or transfers;Assistance with cooking/housework;Direct supervision/assist for financial management;Direct supervision/assist for medications management   Equipment Recommendations  None recommended by OT    Recommendations for Other Services      Precautions / Restrictions Precautions Precautions: Fall;Other (comment) Precaution Comments: monitor SpO2, 4L O2 at baseline Restrictions Weight Bearing Restrictions: No       Mobility Bed Mobility               General bed  mobility comments: up in chair    Transfers Overall transfer level: Needs assistance Equipment used: Rolling walker (2 wheels) Transfers: Sit to/from Stand Sit to Stand: Min guard           General transfer comment: to/from recliner. utilized momentum, successful on third attempt. extra time and effort but no physical assist     Balance Overall balance assessment: Needs assistance Sitting-balance support: No upper extremity supported Sitting balance-Leahy Scale: Fair     Standing balance support: During functional activity, Reliant on assistive device for balance, Bilateral upper extremity supported Standing balance-Leahy Scale: Poor                             ADL either performed or assessed with clinical judgement   ADL Overall ADL's : Needs assistance/impaired                         Toilet Transfer: Min guard;Ambulation           Functional mobility during ADLs: Min guard;Rolling walker (2 wheels) General ADL Comments: Extra time and effort for transfers and mobility. DOE 2/4 after walking bathroom distance.    Extremity/Trunk Assessment Upper Extremity Assessment Upper Extremity Assessment: Generalized weakness   Lower Extremity Assessment Lower Extremity Assessment: Defer to PT evaluation        Vision       Perception     Praxis      Cognition Arousal/Alertness: Awake/alert Behavior During Therapy: WFL for tasks assessed/performed Overall Cognitive Status: Within Functional Limits for tasks assessed  Exercises      Shoulder Instructions       General Comments      Pertinent Vitals/ Pain       Pain Assessment Pain Assessment: Faces Faces Pain Scale: Hurts a little bit Pain Location: generalized with mobility Pain Descriptors / Indicators: Discomfort, Grimacing, Guarding Pain Intervention(s): Monitored during session, Limited activity within patient's  tolerance, Repositioned  Home Living                                          Prior Functioning/Environment              Frequency  Min 2X/week        Progress Toward Goals  OT Goals(current goals can now be found in the care plan section)  Progress towards OT goals: Progressing toward goals  Acute Rehab OT Goals Patient Stated Goal: get stronger OT Goal Formulation: With patient Time For Goal Achievement: 01/24/22 Potential to Achieve Goals: Good ADL Goals Pt Will Perform Grooming: with set-up;sitting Pt Will Perform Lower Body Bathing: with modified independence Pt Will Perform Upper Body Dressing: with set-up;sitting Pt Will Perform Lower Body Dressing: with modified independence;sit to/from stand Pt Will Transfer to Toilet: with modified independence;ambulating Pt Will Perform Toileting - Clothing Manipulation and hygiene: with modified independence;sit to/from stand Pt/caregiver will Perform Home Exercise Program: Increased strength;Both right and left upper extremity;With written HEP provided;With theraband  Plan Discharge plan remains appropriate;Frequency remains appropriate    Co-evaluation                 AM-PAC OT "6 Clicks" Daily Activity     Outcome Measure   Help from another person eating meals?: None Help from another person taking care of personal grooming?: A Little Help from another person toileting, which includes using toliet, bedpan, or urinal?: A Little Help from another person bathing (including washing, rinsing, drying)?: A Lot Help from another person to put on and taking off regular upper body clothing?: A Little Help from another person to put on and taking off regular lower body clothing?: A Lot 6 Click Score: 17    End of Session Equipment Utilized During Treatment: Oxygen;Rolling walker (2 wheels)  OT Visit Diagnosis: Unsteadiness on feet (R26.81);Muscle weakness (generalized) (M62.81)   Activity  Tolerance Other (comment);Patient tolerated treatment well (DOE 2/4. Recovered within 2 minutes seated rest break.)   Patient Left in chair;with call bell/phone within reach   Nurse Communication          Time: 0623-7628 OT Time Calculation (min): 19 min  Charges: OT General Charges $OT Visit: 1 Visit OT Treatments $Self Care/Home Management : 8-22 mins  Tyrone Schimke, OT Acute Rehabilitation Services Office: [phone]   Hortencia Pilar 01/18/2022, 10:52 AM

## 2022-01-18 NOTE — Progress Notes (Signed)
Orthopedic Tech Progress Note Patient Details:  Katelyn Mahoney 1934/02/12 [id number]  Ortho Devices Type of Ortho Device: Haematologist Ortho Device/Splint Location: BLE Ortho Device/Splint Interventions: Ordered, Application, Adjustment   Post Interventions Patient Tolerated: Well Instructions Provided: Care of device  [place] [facility] 01/18/2022, 5:12 PM

## 2022-01-18 NOTE — Progress Notes (Signed)
HD#9 Subjective:  Overnight Events: No events overnight  Patient was seen at bedside the AM. She is seated in chair. She feels that her breathing feels ok, but is about the same with moving around. She does not want to go to the rehab that was offered due to concern about staffing. She has no other acute issues at this time.  Objective:  Vital signs in last 24 hours: Vitals:   01/17/22 0836 01/17/22 1102 01/17/22 2015 01/18/22 0516  BP: 126/61 (!) 121/46 (!) 125/59 (!) 118/91  Pulse: 95 86 (!) 108 86  Resp:  20 18 19   Temp:  98.3 F (36.8 C) 98.4 F (36.9 C) (!) 97.5 F (36.4 C)  TempSrc:  Oral Oral Oral  SpO2:   99% 100%  Weight:    (!) 137.5 kg  Height:       Supplemental O2: Nasal Cannula SpO2: 100 % O2 Flow Rate (L/min): 2 L/min   Physical Exam:  Physical Exam Constitutional:      General: She is not in acute distress.    Appearance: She is not ill-appearing.  HENT:     Head: Normocephalic.  Eyes:     General:        Right eye: No discharge.        Left eye: No discharge.     Conjunctiva/sclera: Conjunctivae normal.  Cardiovascular:     Rate and Rhythm: Normal rate and regular rhythm.  Pulmonary:     Effort: Pulmonary effort is normal.     Comments: On 4 L nasal cannula.  Anterior lung sounds clear. Musculoskeletal:     Comments: Bilateral lower extremity wrapped.  +1 edema.  Skin:    General: Skin is warm.     Coloration: Skin is not jaundiced.  Neurological:     Mental Status: She is alert.  Psychiatric:        Mood and Affect: Mood normal.        Behavior: Behavior normal.    Filed Weights   01/16/22 0419 01/17/22 0430 01/18/22 0516  Weight: (!) 136.4 kg (!) 138.6 kg (!) 137.5 kg    Intake/Output Summary (Last 24 hours) at 01/18/2022 0622 Last data filed at 01/18/2022 0520 Gross per 24 hour  Intake 480 ml  Output 1200 ml  Net -720 ml    Net IO Since Admission: -891 mL [01/18/22 0622]  Pertinent Labs: CBC Latest Ref Rng & Units  01/17/2022 01/16/2022 01/15/2022  WBC 4.0 - 10.5 K/uL 11.0(H) 10.8(H) 9.8  Hemoglobin 12.0 - 15.0 g/dL 9.6(L) 9.5(L) 9.5(L)  Hematocrit 36.0 - 46.0 % 31.2(L) 31.4(L) 30.2(L)  Platelets 150 - 400 K/uL 286 317 331    CMP Latest Ref Rng & Units 01/18/2022 01/17/2022 01/16/2022  Glucose 70 - 99 mg/dL 165(H) 141(H) 155(H)  BUN 8 - 23 mg/dL 35(H) 34(H) 32(H)  Creatinine 0.44 - 1.00 mg/dL 2.09(H) 1.99(H) 2.03(H)  Sodium 135 - 145 mmol/L 140 140 141  Potassium 3.5 - 5.1 mmol/L 3.4(L) 4.0 3.9  Chloride 98 - 111 mmol/L 102 103 105  CO2 22 - 32 mmol/L 31 28 28   Calcium 8.9 - 10.3 mg/dL 8.5(L) 8.7(L) 8.6(L)  Total Protein 6.5 - 8.1 g/dL - - -  Total Bilirubin 0.3 - 1.2 mg/dL - - -  Alkaline Phos 38 - 126 U/L - - -  AST 15 - 41 U/L - - -  ALT 0 - 44 U/L - - -    Imaging: No results found.  Assessment/Plan:  Principal Problem:   Acute exacerbation of CHF (congestive heart failure) (Home) Active Problems:   AKI (acute kidney injury) ([place])   Gastrointestinal hemorrhage   Persistent atrial fibrillation (HCC)  Patient Summary: Katelyn Mahoney is a 88 y.o. with a pertinent PMH of Afib on Eliquis, HFpEF, CKD stage 3b, chronic respiratory failure on 4 L, obesity who was hospitalized for acute HFpEF exacerbation and acute on chronic anemia.   #Acute on chronic HFpEF exacerbation #Chronic respiratory failure, on 3-4 L at home She put out 1.2L + one unmeasured void last night. Her dry weight from last hospitalization was 293. Current weight is 302 pounds. Though she has had significant clinical improvement since admit, she still has +1 edema in her bilateral lower extremities and SOB. Original plan was for discharge to a SNF, however patient declined options for SNF that were available, and she voiced that she would like to go home with home health PT.  However, would like to diurese for at least 1 more day and assess her clinical response to oral Lasix as we do not feel she is quite safe yet to go  home. - Continue oral Lasix 80 mg BID today - Strict I&O's and daily weights - Trend BMP  - Unna boots    #Acute on chronic macrocytic anemia #Nonbleeding AVM EGD showed a single nonbleeding angiodysplastic lesion in the duodenum that was treated with monopolar probe. Colonoscopy showed 3 nonbleeding AVMs that was cauterized. Hemoglobin with mild decrease to 8.7 compared to yesterday but pt HDS at this time. - Continue Eliquis - Protonix 40mg  po bid   #Atrial fibrillation Currently rate controlled. - Diltiazem 30mg  q6h  - Metoprolol 25mg  bid  - Continue Eliquis - Cardiac monitoring    #AKI on CKD3b Suspect cardiorenal. Creatinine is 2.09 today, (baseline ~1.5-1.6 two weeks ago) - Continue oral Lasix - Trend renal function - Strict I&O as above - Avoid nephrotoxic agents    #Diabetes mellitus with peripheral neuropathy  Patient is on 34U basal insulin and 6u tid meal time coverage at home. Most recent HbA1c 7.4. CBGs are well-controlled at this time. - Semglee 17U nightly - Continue novolog 3u tid with meals - SSI with meals - Continue home gabapentin  - CBG monitoring  Diet: Normal IVF: None,None VTE: NOAC Code: DNR PT/OT recs: SNF for Subacute PT, however patient would like home health PT  Dispo: Anticipated discharge to Home in 1 day pending diuresis, medical stability.  Orvis Brill, MD 01/18/2022, 6:22 AM Pager: [phone] Please contact the on call pager after 5 pm and on weekends at [phone].

## 2022-01-18 NOTE — Progress Notes (Signed)
Mobility Specialist Progress Note:   01/18/22 1426  Mobility  Bed Position Chair  Activity Ambulated independently in room  Level of Assistance Standby assist, set-up cues, supervision of patient - no hands on  Assistive Device Front wheel walker  Distance Ambulated (ft) 56 ft  Activity Response Tolerated well  $Mobility charge 1 Mobility   Pt received in chair willing to participate in mobility. No complaints of pain, pt SOB needing to sit 3 times. Pt left in chair with call bell in reach and all needs met.   [facility] Public librarian Phone [phone] Secondary Phone [phone]

## 2022-01-18 NOTE — Significant Event (Signed)
Patient blood pressure is running low MD Made aware and holding Cardizem and performing EKG.

## 2022-01-19 ENCOUNTER — Inpatient Hospital Stay (HOSPITAL_COMMUNITY): Payer: Medicare Other

## 2022-01-19 ENCOUNTER — Other Ambulatory Visit (HOSPITAL_COMMUNITY): Payer: Medicare Other

## 2022-01-19 DIAGNOSIS — I5033 Acute on chronic diastolic (congestive) heart failure: Secondary | ICD-10-CM | POA: Diagnosis not present

## 2022-01-19 DIAGNOSIS — D539 Nutritional anemia, unspecified: Secondary | ICD-10-CM | POA: Diagnosis not present

## 2022-01-19 DIAGNOSIS — J9610 Chronic respiratory failure, unspecified whether with hypoxia or hypercapnia: Secondary | ICD-10-CM | POA: Diagnosis not present

## 2022-01-19 DIAGNOSIS — I4891 Unspecified atrial fibrillation: Secondary | ICD-10-CM | POA: Diagnosis not present

## 2022-01-19 LAB — CBC
HCT: 29.6 % — ABNORMAL LOW (ref 36.0–46.0)
Hemoglobin: 9.3 g/dL — ABNORMAL LOW (ref 12.0–15.0)
MCH: 32.7 pg (ref 26.0–34.0)
MCHC: 31.4 g/dL (ref 30.0–36.0)
MCV: 104.2 fL — ABNORMAL HIGH (ref 80.0–100.0)
Platelets: 360 10*3/uL (ref 150–400)
RBC: 2.84 MIL/uL — ABNORMAL LOW (ref 3.87–5.11)
RDW: 15.3 % (ref 11.5–15.5)
WBC: 11.1 10*3/uL — ABNORMAL HIGH (ref 4.0–10.5)
nRBC: 1.3 % — ABNORMAL HIGH (ref 0.0–0.2)

## 2022-01-19 LAB — BASIC METABOLIC PANEL
Anion gap: 9 (ref 5–15)
Anion gap: 8 (ref 5–15)
BUN: 35 mg/dL — ABNORMAL HIGH (ref 8–23)
BUN: 35 mg/dL — ABNORMAL HIGH (ref 8–23)
CO2: 32 mmol/L (ref 22–32)
CO2: 30 mmol/L (ref 22–32)
Calcium: 8.8 mg/dL — ABNORMAL LOW (ref 8.9–10.3)
Calcium: 8.7 mg/dL — ABNORMAL LOW (ref 8.9–10.3)
Chloride: 100 mmol/L (ref 98–111)
Chloride: 101 mmol/L (ref 98–111)
Creatinine, Ser: 2.05 mg/dL — ABNORMAL HIGH (ref 0.44–1.00)
Creatinine, Ser: 2.29 mg/dL — ABNORMAL HIGH (ref 0.44–1.00)
GFR, Estimated: 23 mL/min — ABNORMAL LOW (ref >60)
GFR, Estimated: 20 mL/min — ABNORMAL LOW (ref >60)
Glucose, Bld: 189 mg/dL — ABNORMAL HIGH (ref 70–99)
Glucose, Bld: 285 mg/dL — ABNORMAL HIGH (ref 70–99)
Potassium: 3.8 mmol/L (ref 3.5–5.1)
Potassium: 4 mmol/L (ref 3.5–5.1)
Sodium: 141 mmol/L (ref 135–145)
Sodium: 139 mmol/L (ref 135–145)

## 2022-01-19 LAB — RETICULOCYTES
Immature Retic Fract: 18.1 % — ABNORMAL HIGH (ref 2.3–15.9)
RBC.: 2.85 MIL/uL — ABNORMAL LOW (ref 3.87–5.11)
Retic Count, Absolute: 92 10*3/uL (ref 19.0–186.0)
Retic Ct Pct: 3.1 % (ref 0.4–3.1)

## 2022-01-19 LAB — GLUCOSE, CAPILLARY
Glucose-Capillary: 194 mg/dL — ABNORMAL HIGH (ref 70–99)
Glucose-Capillary: 214 mg/dL — ABNORMAL HIGH (ref 70–99)
Glucose-Capillary: 214 mg/dL — ABNORMAL HIGH (ref 70–99)
Glucose-Capillary: 216 mg/dL — ABNORMAL HIGH (ref 70–99)

## 2022-01-19 LAB — FERRITIN: Ferritin: 32 ng/mL (ref 11–307)

## 2022-01-19 LAB — MAGNESIUM: Magnesium: 2 mg/dL (ref 1.7–2.4)

## 2022-01-19 MED ORDER — INSULIN ASPART 100 UNIT/ML IJ SOLN
5.0000 [IU] | Freq: Three times a day (TID) | INTRAMUSCULAR | Status: DC
  Administered 2022-01-19 – 2022-01-21 (×7): 5 [IU] via SUBCUTANEOUS

## 2022-01-19 MED ORDER — FUROSEMIDE 10 MG/ML IJ SOLN
80.0000 mg | Freq: Once | INTRAMUSCULAR | Status: AC
  Administered 2022-01-19: 80 mg via INTRAVENOUS
  Filled 2022-01-19: qty 8

## 2022-01-19 NOTE — Care Management Important Message (Signed)
Important Message  Patient Details  Name: Adelaida Reindel MRN: [id number] Date of Birth: 1934-05-17   Medicare Important Message Given:  Yes     Shelda Altes 01/19/2022, 10:09 AM

## 2022-01-19 NOTE — Progress Notes (Signed)
Inpatient Diabetes Program Recommendations  AACE/ADA: New Consensus Statement on Inpatient Glycemic Control (2015)  Target Ranges:  Prepandial:   less than 140 mg/dL      Peak postprandial:   less than 180 mg/dL (1-2 hours)      Critically ill patients:  140 - 180 mg/dL   Lab Results  Component Value Date   GLUCAP 194 (H) 01/19/2022   HGBA1C 7.4 (H) 11/13/2021    Review of Glycemic Control  Latest Reference Range & Units 01/18/22 06:09 01/18/22 11:10 01/18/22 15:35 01/18/22 21:17 01/19/22 05:45  Glucose-Capillary 70 - 99 mg/dL 169 (H) 219 (H) 192 (H) 172 (H) 194 (H)   Diabetes history: DM 2 Outpatient Diabetes medications: Humalog 6 units tid with meals, Ozempic 1 mg QSaturday Current orders for Inpatient glycemic control:  Semglee 17 units Novolog 0-20 units tid Novolog 3 units tid meal coverage  A1c 7.4% on 11/13/2021  Inpatient Diabetes Program Recommendations:   -   Increase Semglee to 20 units  Thanks,  Tama Headings RN, MSN, BC-ADM Inpatient Diabetes Coordinator Team Pager [phone] (8a-5p)

## 2022-01-19 NOTE — Progress Notes (Addendum)
HD#10 Subjective:  Overnight Events: No events overnight  Patient was seen at bedside the AM. She is seated in chair. She feels more short of breath today and with more increased WOB compared to yesterday. She does not want to go to the rehab that was offered due to concern about staffing. She has no other acute issues at this time.  Objective:  Vital signs in last 24 hours: Vitals:   01/18/22 2150 01/19/22 0000 01/19/22 0420 01/19/22 0545  BP: (!) 109/59 114/87 105/66 107/72  Pulse: 90 (!) 101 94 (!) 56  Resp:   18 18  Temp:   97.6 F (36.4 C)   TempSrc:   Oral   SpO2:   95% 97%  Weight:   (!) 137.4 kg   Height:       Supplemental O2: Nasal Cannula SpO2: 97 % O2 Flow Rate (L/min): 2 L/min   Physical Exam:  Physical Exam Constitutional:      General: She is not in acute distress.    Appearance: She is not ill-appearing.  HENT:     Head: Normocephalic.  Eyes:     General:        Right eye: No discharge.        Left eye: No discharge.     Conjunctiva/sclera: Conjunctivae normal.  Cardiovascular:     Rate and Rhythm: Normal rate and regular rhythm.  Pulmonary:     Effort: Pulmonary effort is normal.     Comments: On 4 L nasal cannula.  Rales and decreased breath sounds appreciated in bilateral lower lung fields. Musculoskeletal:     Comments: Bilateral lower extremity wrapped.  +1 edema.  Skin:    General: Skin is warm.     Coloration: Skin is not jaundiced.  Neurological:     Mental Status: She is alert.  Psychiatric:        Mood and Affect: Mood normal.        Behavior: Behavior normal.    Filed Weights   01/17/22 0430 01/18/22 0516 01/19/22 0420  Weight: (!) 138.6 kg (!) 137.5 kg (!) 137.4 kg    Intake/Output Summary (Last 24 hours) at 01/19/2022 0726 Last data filed at 01/19/2022 0422 Gross per 24 hour  Intake 1508 ml  Output 1635 ml  Net -127 ml    Net IO Since Admission: -1,018 mL [01/19/22 0726]  Pertinent Labs: CBC Latest Ref Rng & Units  01/19/2022 01/18/2022 01/17/2022  WBC 4.0 - 10.5 K/uL 11.1(H) 10.2 11.0(H)  Hemoglobin 12.0 - 15.0 g/dL 9.3(L) 8.7(L) 9.6(L)  Hematocrit 36.0 - 46.0 % 29.6(L) 29.8(L) 31.2(L)  Platelets 150 - 400 K/uL 360 342 286    CMP Latest Ref Rng & Units 01/19/2022 01/18/2022 01/17/2022  Glucose 70 - 99 mg/dL 189(H) 165(H) 141(H)  BUN 8 - 23 mg/dL 35(H) 35(H) 34(H)  Creatinine 0.44 - 1.00 mg/dL 2.05(H) 2.09(H) 1.99(H)  Sodium 135 - 145 mmol/L 141 140 140  Potassium 3.5 - 5.1 mmol/L 3.8 3.4(L) 4.0  Chloride 98 - 111 mmol/L 100 102 103  CO2 22 - 32 mmol/L 32 31 28  Calcium 8.9 - 10.3 mg/dL 8.8(L) 8.5(L) 8.7(L)  Total Protein 6.5 - 8.1 g/dL - - -  Total Bilirubin 0.3 - 1.2 mg/dL - - -  Alkaline Phos 38 - 126 U/L - - -  AST 15 - 41 U/L - - -  ALT 0 - 44 U/L - - -    Imaging: No results found.  Assessment/Plan:  Principal Problem:   Acute exacerbation of CHF (congestive heart failure) ([place]) Active Problems:   AKI (acute kidney injury) ([place])   Gastrointestinal hemorrhage   Persistent atrial fibrillation ([facility])  Patient Summary: Katelyn Mahoney is a 88 y.o. with a pertinent PMH of Afib on Eliquis, HFpEF, CKD stage 3b, chronic respiratory failure on 4 L, obesity who was hospitalized for acute HFpEF exacerbation and acute on chronic anemia.   #Acute on chronic HFpEF exacerbation #Chronic respiratory failure, on 3-4 L at home She put out 1.6L + two unmeasured voids last night. Her dry weight from last hospitalization was 293. Current weight is 302 pounds. Though she has had significant clinical improvement since admit, she still has significant edema in her bilateral lower extremities, worsening SOB, and Rales in bilateral lower lung fields. Transitioned from oral Lasix to IV, will give IV 80 mg Lasix today and assess her clinical response.  Chest x-ray was ordered and shows mild vascular congestion without edema. Additionally, her creatinine is at 2.05 today, which has not improved the past few days  despite diuresis.  Will consult nephrology today to help determine our options at this point. - Consult to nephrology today - IV Lasix 80 mg x 1 today, will assess output and will obtain repeat BMP at 3 PM. - Strict I&O's and daily weights - Trend BMP  - Unna boots    #Acute on chronic macrocytic anemia #Nonbleeding AVM EGD showed a single nonbleeding angiodysplastic lesion in the duodenum that was treated with monopolar probe. Colonoscopy showed 3 nonbleeding AVMs that was cauterized.  Hemoglobin is stable at 9.3,  - Continue Eliquis - Protonix 40mg  po bid   #Atrial fibrillation Currently rate controlled. - Diltiazem 30mg  q6h  - Metoprolol 25mg  bid  - Continue Eliquis - Cardiac monitoring    #AKI on CKD3b Suspect cardiorenal. Creatinine is 2.05 today, (baseline ~1.5-1.6 three weeks ago) - Consult Nephrology today - Continue IV Lasix - Trend renal function - Strict I&O as above - Avoid nephrotoxic agents    #Diabetes mellitus with peripheral neuropathy  Patient is on 34U basal insulin and 6u tid meal time coverage at home. Most recent HbA1c 7.4. CBGs in the high 100s-low 200s. - Semglee 17U nightly - Increase novolog to 5u tid with meals - SSI with meals - Continue home gabapentin  - CBG monitoring  Diet: Normal IVF: None,None VTE: NOAC Code: DNR PT/OT recs: SNF for Subacute PT, however patient would like home health PT  Dispo: Anticipated discharge to Home pending Nephrology eval, diuresis  Katelyn Brill, MD 01/19/2022, 7:26 AM Pager: [phone] Please contact the on call pager after 5 pm and on weekends at [phone].

## 2022-01-19 NOTE — Progress Notes (Signed)
Physical Therapy Treatment Patient Details Name: Katelyn Mahoney MRN: [id number] DOB: 1934-01-30 Today's Date: 01/19/2022   History of Present Illness Pt is an 88 y.o. female who presented 01/08/22 with SOB and weight gain. Pt admitted for acute on chronic HFpEF exacerbation. Pt also with melenic stools/heme positive, plan for EGD 01/10/22. S/p colonoscopy 1/13. PMH includes DM II, HTN, obesity, CKD, HLD, chronic LE edema, chronic hypoxic respiratory failure on 4L oxygen at home, Afib, HfpEF    PT Comments    Focused session on lower extremity strengthening and balance interventions to improve her ease with transfers and reduce her risk for falls. Pt tolerated well, but was limited by deficits in aerobic endurance. Will continue to follow acutely. Current recommendations remain appropriate.    Recommendations for follow up therapy are one component of a multi-disciplinary discharge planning process, led by the attending physician.  Recommendations may be updated based on patient status, additional functional criteria and insurance authorization.  Follow Up Recommendations  Skilled nursing-short term rehab (<3 hours/day) (if has level of assistance necessary pt can go home with HHPT)     Assistance Recommended at Discharge Intermittent Supervision/Assistance  Patient can return home with the following A little help with walking and/or transfers;A little help with bathing/dressing/bathroom;Assistance with cooking/housework;Assist for transportation;Help with stairs or ramp for entrance   Equipment Recommendations  Other (comment) (shower seat)    Recommendations for Other Services       Precautions / Restrictions Precautions Precautions: Fall;Other (comment) Precaution Comments: monitor SpO2, 4L O2 at baseline Restrictions Weight Bearing Restrictions: No     Mobility  Bed Mobility               General bed mobility comments: up in chair    Transfers Overall transfer level:  Needs assistance Equipment used: Rolling walker (2 wheels) Transfers: Sit to/from Stand Sit to Stand: Min guard           General transfer comment: Extra time and pt rocking to gain momentum, pushing up from chair to stand at RW 5x, min guard for safety, no LOB.    Ambulation/Gait               General Gait Details: deferred to focus on exercises   Stairs             Wheelchair Mobility    Modified Rankin (Stroke Patients Only)       Balance Overall balance assessment: Needs assistance Sitting-balance support: No upper extremity supported Sitting balance-Leahy Scale: Fair     Standing balance support: During functional activity, Reliant on assistive device for balance, Bilateral upper extremity supported Standing balance-Leahy Scale: Fair Standing balance comment: Able to stand for ~8-10 sec with either leg in lead in tandem stance statically with eyes open and no UE support and able to stand Rhomberg stance eyes open, no UE support, > 10 sec.     Tandem Stance - Right Leg:  (8-10 sec, no UE support, eyes open, min guard) Tandem Stance - Left Leg:  (8-10 sec, no UE support, eyes open, min guard) Rhomberg - Eyes Opened:  (>10 sec no UE support)                  Cognition Arousal/Alertness: Awake/alert Behavior During Therapy: WFL for tasks assessed/performed Overall Cognitive Status: Within Functional Limits for tasks assessed  Exercises General Exercises - Lower Extremity Long Arc Quad: AROM, Both, 10 reps, Seated, Strengthening Hip ABduction/ADduction: AROM, Strengthening, Both, 10 reps, Seated (pillow squeeze then abduction against manual resistance from PT) Other Exercises Other Exercises: sit <> stand using UEs, 5x Other Exercises: Rhomberg stance > 10 sec no UE support eyes open, tandem stance each leg in lead 1x ~8-10 sec eyes open no UE support    General Comments General  comments (skin integrity, edema, etc.): SpO2 down to 82% on 4.5L at times, RR up to 30s, HR up to 100s, pt's monitor was reading non-sustained Vtach and vent bigeminy intermittently when she would exert herself with exercises, but recovered well with resting      Pertinent Vitals/Pain Pain Assessment Pain Assessment: Faces Faces Pain Scale: Hurts a little bit Pain Location: generalized with mobility Pain Descriptors / Indicators: Discomfort, Grimacing, Guarding Pain Intervention(s): Limited activity within patient's tolerance, Monitored during session, Repositioned    Home Living                          Prior Function            PT Goals (current goals can now be found in the care plan section) Acute Rehab PT Goals Patient Stated Goal: to go home PT Goal Formulation: With patient Time For Goal Achievement: 01/24/22 Potential to Achieve Goals: Good Progress towards PT goals: Progressing toward goals    Frequency    Min 3X/week      PT Plan Current plan remains appropriate    Co-evaluation              AM-PAC PT "6 Clicks" Mobility   Outcome Measure  Help needed turning from your back to your side while in a flat bed without using bedrails?: A Little Help needed moving from lying on your back to sitting on the side of a flat bed without using bedrails?: A Little Help needed moving to and from a bed to a chair (including a wheelchair)?: A Little Help needed standing up from a chair using your arms (e.g., wheelchair or bedside chair)?: A Little Help needed to walk in hospital room?: A Lot Help needed climbing 3-5 steps with a railing? : Total 6 Click Score: 15    End of Session Equipment Utilized During Treatment: Oxygen Activity Tolerance: Patient limited by fatigue Patient left: with call bell/phone within reach;in chair Nurse Communication: Other (comment) (monitor alarming non-sustained Vtach and vent bigeminy on occasion with exertion) PT Visit  Diagnosis: Muscle weakness (generalized) (M62.81);Unsteadiness on feet (R26.81);Other abnormalities of gait and mobility (R26.89);Difficulty in walking, not elsewhere classified (R26.2)     Time: 4098-1191 PT Time Calculation (min) (ACUTE ONLY): 28 min  Charges:  $Therapeutic Exercise: 8-22 mins $Neuromuscular Re-education: 8-22 mins                     Moishe Spice, PT, DPT Acute Rehabilitation Services  Pager: [phone] Office: [phone]    Orvan Falconer 01/19/2022, 6:39 PM

## 2022-01-19 NOTE — Consult Note (Signed)
[facility]  Reason for Consultation: AKI on CKD Requesting Provider: Dr. Saverio Danker   HPI: Katelyn Mahoney is an 88 y.o. female with DM, A fib on eliquis, HTN, HL, h/o depression currently admitted with acute on chronic HFpEF who is seen for evaluation and management of AKI on CKD.    Admitted 12/13-28/2022 with sepsis secondary to proteus UTI/bacteremia.  Had new A fib as well.  TTE with grade 2 DD.  Lasix dose on dc 40 daily.  Discharged on 4L with request for outpt PFTs.  Cr during that admission ranged 1.4 to 2.1, discharge 1.6.    Presented 01/09/21 with dyspnea on background of 20lb weight gain after recent hospitalization.  PTA she had increased lasix from 40 to 80 BID without effect.    Here she was diuresed with 80 IV BID of lasix from 11-15th. Diuresis held 16th. 1/17 lasix 80 po, 1/18-19 lasix 80 po BID, 1/20 lasix 80 po x 1 dose, 80 IV ordered for afternoon Cr initially 2.15 and has been in the 1.7 to 2.1 range during admission.  Net I/Os -650 - she's never really had a good diuresis on a particular day.  Wt has been stable in the 137kg range the entire admission.  CXR today with mild congestion.  She says she's feeling worse since admission with DOE, orthopnea, edema.    She was also evaluated for melena on admission. Eliquis held.  Colonoscopy with 3 angiodysplastic lesions s/p therapy.  Ok to resume eliquis per GI.  Hb has been stable in the 9s.   PMH: Past Medical History:  Diagnosis Date   Allergy    Anxiety    Arthritis    Arthritis    Callus    Depression    Diabetes mellitus without complication ([place])    Hyperlipidemia    Hypertension    PSH: Past Surgical History:  Procedure Laterality Date   ABDOMINAL HYSTERECTOMY     CHOLECYSTECTOMY     COLONOSCOPY WITH PROPOFOL N/A 01/12/2022   Procedure: COLONOSCOPY WITH PROPOFOL;  Surgeon: Carol Ada, MD;  Location: [facility];  Service: Endoscopy;  Laterality: N/A;    ESOPHAGOGASTRODUODENOSCOPY N/A 01/10/2022   Procedure: ESOPHAGOGASTRODUODENOSCOPY (EGD);  Surgeon: Carol Ada, MD;  Location: [facility];  Service: Endoscopy;  Laterality: N/A;   HEMOSTASIS CLIP PLACEMENT  01/12/2022   Procedure: HEMOSTASIS CLIP PLACEMENT;  Surgeon: Carol Ada, MD;  Location: [facility];  Service: Endoscopy;;   HOT HEMOSTASIS N/A 01/10/2022   Procedure: HOT HEMOSTASIS (ARGON PLASMA COAGULATION/BICAP);  Surgeon: Carol Ada, MD;  Location: [facility];  Service: Endoscopy;  Laterality: N/A;   HOT HEMOSTASIS N/A 01/12/2022   Procedure: HOT HEMOSTASIS (ARGON PLASMA COAGULATION/BICAP);  Surgeon: Carol Ada, MD;  Location: [facility];  Service: Endoscopy;  Laterality: N/A;   SPLENECTOMY       Past Medical History:  Diagnosis Date   Allergy    Anxiety    Arthritis    Arthritis    Callus    Depression    Diabetes mellitus without complication ([place])    Hyperlipidemia    Hypertension     Medications:  I have reviewed the patient's current medications.  Medications Prior to Admission  Medication Sig Dispense Refill   acetaminophen (TYLENOL) 650 MG CR tablet 650 mg every 8 (eight) hours.     apixaban (ELIQUIS) 2.5 MG TABS tablet Take 1 tablet (2.5 mg total) by mouth 2 (two) times daily. 60 tablet 0   atorvastatin (LIPITOR) 20 MG tablet  TAKE 1/2 TABLET ON MONDAY, WEDNESDAY, FRIDAY AND      SUNDAYS (Patient taking differently: Take 10 mg by mouth See admin instructions. TAKE 1/2 TABLET ON MONDAY, WEDNESDAY, FRIDAY AND      SUNDAYS) 26 tablet 1   Bilberry, Vaccinium myrtillus, (BILBERRY PO) Take 1 tablet by mouth daily.     cyanocobalamin 1000 MCG tablet Take 1 tablet (1,000 mcg total) by mouth daily. 30 tablet 0   diltiazem (CARDIZEM CD) 120 MG 24 hr capsule Take 1 capsule (120 mg total) by mouth daily. 30 capsule 0   fluocinonide cream (LIDEX) 3.81 % Apply 1 application topically daily as needed (breakouts).     furosemide (LASIX) 40 MG tablet TAKE 1 TABLET  DAILY (Patient taking differently: Take 40 mg by mouth daily as needed for fluid.) 90 tablet 1   gabapentin (NEURONTIN) 100 MG capsule TAKE 1 CAPSULE BY MOUTH 3 TIMES DAILY (Patient taking differently: 100 mg 2 (two) times daily.) 90 capsule 3   Glucosamine HCl (GLUCOSAMINE PO) Take 1 tablet by mouth daily.     insulin lispro (HUMALOG KWIKPEN) 100 UNIT/ML KwikPen Inject 6 Units into the skin with breakfast, with lunch, and with evening meal. Inject 7-9 units under the skin three times daily before meals. E11.65 30 mL 0   levalbuterol (XOPENEX) 0.63 MG/3ML nebulizer solution Take 3 mLs (0.63 mg total) by nebulization every 6 (six) hours as needed for wheezing or shortness of breath. 3 mL 12   Loratadine 10 MG CAPS Take 10 mg by mouth daily.     melatonin 3 MG TABS tablet Take 1 tablet (3 mg total) by mouth at bedtime. 30 tablet 0   metoprolol succinate (TOPROL-XL) 100 MG 24 hr tablet Take 0.5 tablets (50 mg total) by mouth daily. Take with or immediately following a meal. 90 tablet 1   multivitamin-lutein (OCUVITE-LUTEIN) CAPS capsule Take 1 capsule by mouth daily. 90 capsule 3   OZEMPIC, 1 MG/DOSE, 4 MG/3ML SOPN INJECT 1MG  SUBCUTANEOUSLY  WEEKLY (EVERY 7 DAYS) (Patient taking differently: Inject 1 mg into the skin every Saturday.) 9 mL 1   pantoprazole (PROTONIX) 40 MG tablet Take 1 tablet (40 mg total) by mouth daily. 30 tablet 0   sertraline (ZOLOFT) 100 MG tablet TAKE 1 TABLET DAILY (Patient taking differently: Take 100 mg by mouth daily.) 90 tablet 3   sodium chloride (OCEAN) 0.65 % SOLN nasal spray Place 1 spray into both nostrils as needed for congestion.     Insulin Pen Needle 32G X 4 MM MISC Use on insulin pens 4 times a day 400 each 2   NONFORMULARY OR COMPOUNDED ITEM Lift chair  #1   Dx osteoarthritis knees 1 each 0   OneTouch Delica Lancets 82X MISC Use to test blood sugar once daily- E11.65 100 each 3   ONETOUCH VERIO test strip USE TO TEST BLOOD SUGAR    TWICE A DAY 200 strip 3   TOUJEO  SOLOSTAR 300 UNIT/ML Solostar Pen INJECT 34 UNITS            SUBCUTANEOUSLY DAILY (Patient not taking: Reported on 01/09/2022) 13.5 mL 1    ALLERGIES:   Allergies  Allergen Reactions   Bupropion Hcl     Palmar rash   Penicillins     rash   Citalopram Hydrobromide Other (See Comments)    cough   Metformin And Related Diarrhea   Zinc Other (See Comments)    unknown ...   Blue Dyes (Parenteral) Rash   Cobalt  Rash   Gold-Containing Drug Products Rash   Nickel Rash    FAM HX: Family History  Problem Relation Age of Onset   Cancer Mother    Diabetes Mother    Cancer Father    Diabetes Father    Cancer Sister    Diabetes Sister    Cancer Brother    Brain cancer Brother    Diabetes Brother    Diabetes Maternal Grandmother    Diabetes Paternal Grandmother    Cancer - Lung Brother    Diabetes Brother     Social History:   reports that she has never smoked. She has never used smokeless tobacco. She reports that she does not drink alcohol and does not use drugs.  ROS: 12 system ROS neg except per HPI above  Blood pressure 112/68, pulse (!) 107, temperature 97.9 F (36.6 C), temperature source Oral, resp. rate 20, height 5\' 6"  (1.676 m), weight (!) 137.4 kg, SpO2 97 %. PHYSICAL EXAM: Gen: elderly woman in bedside chair   Eyes: anicteric ENT: MMM Neck: supple, ^ JVD at 45 degrees CV:  tachycardic, irregular, no rub Abd:  soft obese Lungs: dec BS bases, ^ WOB with conversation GU: purewick with yellow urine in can Extr: Unna wraps to LE, 1+ pitting in thighs Neuro: conversant, nonfocal   Results for orders placed or performed during the hospital encounter of 01/08/22 (from the past 48 hour(s))  Glucose, capillary     Status: Abnormal   Collection Time: 01/17/22  5:23 PM  Result Value Ref Range   Glucose-Capillary 141 (H) 70 - 99 mg/dL    Comment: Glucose reference range applies only to samples taken after fasting for at least 8 hours.  Glucose, capillary     Status:  Abnormal   Collection Time: 01/17/22  9:14 PM  Result Value Ref Range   Glucose-Capillary 252 (H) 70 - 99 mg/dL    Comment: Glucose reference range applies only to samples taken after fasting for at least 8 hours.  Basic metabolic panel     Status: Abnormal   Collection Time: 01/18/22  3:27 AM  Result Value Ref Range   Sodium 140 135 - 145 mmol/L   Potassium 3.4 (L) 3.5 - 5.1 mmol/L   Chloride 102 98 - 111 mmol/L   CO2 31 22 - 32 mmol/L   Glucose, Bld 165 (H) 70 - 99 mg/dL    Comment: Glucose reference range applies only to samples taken after fasting for at least 8 hours.   BUN 35 (H) 8 - 23 mg/dL   Creatinine, Ser 2.09 (H) 0.44 - 1.00 mg/dL   Calcium 8.5 (L) 8.9 - 10.3 mg/dL   GFR, Estimated 23 (L) >60 mL/min    Comment: (NOTE) Calculated using the CKD-EPI Creatinine Equation (2021)    Anion gap 7 5 - 15    Comment: Performed at [facility] [address] [place], [place] [address]  Magnesium     Status: None   Collection Time: 01/18/22  3:27 AM  Result Value Ref Range   Magnesium 2.0 1.7 - 2.4 mg/dL    Comment: Performed at [facility] [address], [place] [address]  CBC     Status: Abnormal   Collection Time: 01/18/22  3:27 AM  Result Value Ref Range   WBC 10.2 4.0 - 10.5 K/uL   RBC 2.75 (L) 3.87 - 5.11 MIL/uL   Hemoglobin 8.7 (L) 12.0 - 15.0 g/dL   HCT 29.8 (  L) 36.0 - 46.0 %   MCV 108.4 (H) 80.0 - 100.0 fL   MCH 31.6 26.0 - 34.0 pg   MCHC 29.2 (L) 30.0 - 36.0 g/dL   RDW 15.2 11.5 - 15.5 %   Platelets 342 150 - 400 K/uL   nRBC 1.1 (H) 0.0 - 0.2 %    Comment: Performed at [facility] [address], [place], [place] [address]  Phosphorus     Status: None   Collection Time: 01/18/22  3:27 AM  Result Value Ref Range   Phosphorus 3.4 2.5 - 4.6 mg/dL    Comment: Performed at [facility] [address] [place], [place] [address]  Glucose, capillary     Status: Abnormal   Collection Time: 01/18/22  6:09 AM  Result Value Ref  Range   Glucose-Capillary 169 (H) 70 - 99 mg/dL    Comment: Glucose reference range applies only to samples taken after fasting for at least 8 hours.   Comment 1 Notify RN    Comment 2 Document in Chart   Glucose, capillary     Status: Abnormal   Collection Time: 01/18/22 11:10 AM  Result Value Ref Range   Glucose-Capillary 219 (H) 70 - 99 mg/dL    Comment: Glucose reference range applies only to samples taken after fasting for at least 8 hours.  Glucose, capillary     Status: Abnormal   Collection Time: 01/18/22  3:35 PM  Result Value Ref Range   Glucose-Capillary 192 (H) 70 - 99 mg/dL    Comment: Glucose reference range applies only to samples taken after fasting for at least 8 hours.  Glucose, capillary     Status: Abnormal   Collection Time: 01/18/22  9:17 PM  Result Value Ref Range   Glucose-Capillary 172 (H) 70 - 99 mg/dL    Comment: Glucose reference range applies only to samples taken after fasting for at least 8 hours.   Comment 1 Notify RN    Comment 2 Document in Chart   Glucose, capillary     Status: Abnormal   Collection Time: 01/19/22  5:45 AM  Result Value Ref Range   Glucose-Capillary 194 (H) 70 - 99 mg/dL    Comment: Glucose reference range applies only to samples taken after fasting for at least 8 hours.  Ferritin     Status: None   Collection Time: 01/19/22  7:08 AM  Result Value Ref Range   Ferritin 32 11 - 307 ng/mL    Comment: Performed at [facility], [place] [address] [place], [place] [address]  Reticulocytes     Status: Abnormal   Collection Time: 01/19/22  7:08 AM  Result Value Ref Range   Retic Ct Pct 3.1 0.4 - 3.1 %   RBC. 2.85 (L) 3.87 - 5.11 MIL/uL   Retic Count, Absolute 92.0 19.0 - 186.0 K/uL   Immature Retic Fract 18.1 (H) 2.3 - 15.9 %    Comment: Performed at [facility] [address], [place], [place] [address]  CBC     Status: Abnormal   Collection Time: 01/19/22  7:08 AM  Result Value Ref Range   WBC 11.1 (H) 4.0 - 10.5  K/uL   RBC 2.84 (L) 3.87 - 5.11 MIL/uL   Hemoglobin 9.3 (L) 12.0 - 15.0 g/dL   HCT 29.6 (L) 36.0 - 46.0 %   MCV 104.2 (H) 80.0 - 100.0 fL   MCH 32.7 26.0 - 34.0 pg  MCHC 31.4 30.0 - 36.0 g/dL   RDW 15.3 11.5 - 15.5 %   Platelets 360 150 - 400 K/uL   nRBC 1.3 (H) 0.0 - 0.2 %    Comment: Performed at [facility] [address], [place], [place] [address]  Basic metabolic panel     Status: Abnormal   Collection Time: 01/19/22  7:08 AM  Result Value Ref Range   Sodium 141 135 - 145 mmol/L   Potassium 3.8 3.5 - 5.1 mmol/L   Chloride 100 98 - 111 mmol/L   CO2 32 22 - 32 mmol/L   Glucose, Bld 189 (H) 70 - 99 mg/dL    Comment: Glucose reference range applies only to samples taken after fasting for at least 8 hours.   BUN 35 (H) 8 - 23 mg/dL   Creatinine, Ser 2.05 (H) 0.44 - 1.00 mg/dL   Calcium 8.8 (L) 8.9 - 10.3 mg/dL   GFR, Estimated 23 (L) >60 mL/min    Comment: (NOTE) Calculated using the CKD-EPI Creatinine Equation (2021)    Anion gap 9 5 - 15    Comment: Performed at [facility] [address], [place], [place] [address]  Magnesium     Status: None   Collection Time: 01/19/22  7:08 AM  Result Value Ref Range   Magnesium 2.0 1.7 - 2.4 mg/dL    Comment: Performed at [facility] [address], [place], [place] [address]  Glucose, capillary     Status: Abnormal   Collection Time: 01/19/22 11:20 AM  Result Value Ref Range   Glucose-Capillary 214 (H) 70 - 99 mg/dL    Comment: Glucose reference range applies only to samples taken after fasting for at least 8 hours.    DG CHEST PORT 1 VIEW  Result Date: 01/19/2022 CLINICAL DATA:  Short of breath EXAM: PORTABLE CHEST 1 VIEW COMPARISON:  01/08/2022 FINDINGS: Cardiac enlargement. Mild vascular congestion. Improvement in bibasilar airspace disease. No significant effusion. No significant pleural effusion identified. IMPRESSION: Mild vascular congestion without edema Improvement in bibasilar airspace disease.   Improved lung volumes. Electronically Signed   By: Franchot Gallo M.D.   On: 01/19/2022 10:40    Assessment/Plan **AKI on CKD:  longstanding CKD dating back to at least 2016; follows with [facility].  Baseline Cr 2022 was in the 1.2-1.4 range until sepsis admission in 11/2021. Cr more in the 1.5-1.7 range and now during admission for CHF running more in the 2 range. Suspect cardiorenal picture but needs renal US and UA to r/o other issues.  BP not frankly low but question if AFib impacting renal perfusion to a degree.  At this point she has obligatory diuresis that needs to happen - will hopefully be able to manage without major impact on kidney function but that's the risk.  Will follow daily labs.  Avoid nephrotoxic meds and events as able.   **AoC HFpEF: 11/2021 grade 2 DD.  Quite severe symptoms of volume overload.  Agree she needs more aggressive diuresis.  Has been resumed on lasix 80 IV tonight but if no significant diuresis in AM will need to titrate dose and consider adding metolazone.   **Anemia:  normal in 11/2021; melena and angiodysplatic colon lesions on colonoscopy s/p therapy.  Hb stable in the 9s now.  No indication for ESA.   **DM:  well controlled typically.  Meds per primary.   **A fib:  rate controlled on dilt and metoprolol.  On eliquis.   **  H/o HTN: has been lower since A fib, off amlodipine now.    Will follow, contact with concerns.  Justin Mend 01/19/2022, 2:58 PM

## 2022-01-19 NOTE — Progress Notes (Signed)
Mobility Specialist Progress Note:   01/19/22 1551  Mobility  Activity Ambulated with assistance in room  Level of Assistance Standby assist, set-up cues, supervision of patient - no hands on  Assistive Device Front wheel walker  Distance Ambulated (ft) 56 ft  Activity Response Tolerated well  $Mobility charge 1 Mobility   Pt received in bed willing to participate in mobility. No complaints of pain, pt more SOB today but SpO2 remained above 92% throughout ambulation. Sat on BSC and was able to have small BM.Pt left in chair with call bell in reach and all needs met.   [facility] Public librarian Phone [phone] Secondary Phone [phone]

## 2022-01-20 LAB — BASIC METABOLIC PANEL
Anion gap: 5 (ref 5–15)
BUN: 35 mg/dL — ABNORMAL HIGH (ref 8–23)
CO2: 33 mmol/L — ABNORMAL HIGH (ref 22–32)
Calcium: 8.4 mg/dL — ABNORMAL LOW (ref 8.9–10.3)
Chloride: 101 mmol/L (ref 98–111)
Creatinine, Ser: 2.15 mg/dL — ABNORMAL HIGH (ref 0.44–1.00)
GFR, Estimated: 22 mL/min — ABNORMAL LOW (ref >60)
Glucose, Bld: 164 mg/dL — ABNORMAL HIGH (ref 70–99)
Potassium: 3.4 mmol/L — ABNORMAL LOW (ref 3.5–5.1)
Sodium: 139 mmol/L (ref 135–145)

## 2022-01-20 LAB — CBC
HCT: 28.8 % — ABNORMAL LOW (ref 36.0–46.0)
Hemoglobin: 8.9 g/dL — ABNORMAL LOW (ref 12.0–15.0)
MCH: 32.4 pg (ref 26.0–34.0)
MCHC: 30.9 g/dL (ref 30.0–36.0)
MCV: 104.7 fL — ABNORMAL HIGH (ref 80.0–100.0)
Platelets: 341 10*3/uL (ref 150–400)
RBC: 2.75 MIL/uL — ABNORMAL LOW (ref 3.87–5.11)
RDW: 15.2 % (ref 11.5–15.5)
WBC: 10.4 10*3/uL (ref 4.0–10.5)
nRBC: 1.3 % — ABNORMAL HIGH (ref 0.0–0.2)

## 2022-01-20 LAB — GLUCOSE, CAPILLARY
Glucose-Capillary: 164 mg/dL — ABNORMAL HIGH (ref 70–99)
Glucose-Capillary: 214 mg/dL — ABNORMAL HIGH (ref 70–99)
Glucose-Capillary: 191 mg/dL — ABNORMAL HIGH (ref 70–99)
Glucose-Capillary: 188 mg/dL — ABNORMAL HIGH (ref 70–99)

## 2022-01-20 MED ORDER — POTASSIUM CHLORIDE CRYS ER 20 MEQ PO TBCR
40.0000 meq | EXTENDED_RELEASE_TABLET | Freq: Once | ORAL | Status: AC
  Administered 2022-01-20: 40 meq via ORAL
  Filled 2022-01-20: qty 2

## 2022-01-20 MED ORDER — POTASSIUM CHLORIDE 20 MEQ PO PACK
40.0000 meq | PACK | Freq: Once | ORAL | Status: DC
  Filled 2022-01-20: qty 2

## 2022-01-20 MED ORDER — FUROSEMIDE 10 MG/ML IJ SOLN
120.0000 mg | Freq: Two times a day (BID) | INTRAVENOUS | Status: DC
  Administered 2022-01-20 – 2022-01-21 (×2): 120 mg via INTRAVENOUS
  Filled 2022-01-20 (×2): qty 12
  Filled 2022-01-20 (×2): qty 10

## 2022-01-20 NOTE — Progress Notes (Signed)
[facility] Progress Note   Subjective:   Continues to feel poorly.  Slept in chair due to orthopnea.   I/Os yesterday 800/500 with lasix 80 IV in PM.  She says no unmeasured voids.   Objective Vitals:   01/19/22 1400 01/19/22 2102 01/19/22 2354 01/20/22 0536  BP:  (!) 145/89 117/63 (!) 126/99  Pulse:   94 (!) 41  Resp: 20 20 (!) 24   Temp:  97.7 F (36.5 C)  97.8 F (36.6 C)  TempSrc:  Oral  Oral  SpO2: 97% 96% 96% 99%  Weight:    (!) 137.1 kg  Height:       PHYSICAL EXAM: Gen: in chair looking tired Eyes: anicteric ENT: MMM Neck: supple, ^ JVD at 45 degrees CV:  Reg S1, S2, irreg with PVCs on monitor, no rub Abd:  soft obese Lungs: dec BS bases, ^ WOB with conversation GU: purewick with yellow urine in can Extr: Unna wraps to LE, 1+ pitting in thighs Neuro: conversant, nonfocal  Additional Objective Labs: Basic Metabolic Panel: Recent Labs  Lab 01/18/22 0327 01/19/22 0708 01/19/22 1422 01/20/22 0328  NA 140 141 139 139  K 3.4* 3.8 4.0 3.4*  CL 102 100 101 101  CO2 31 32 30 33*  GLUCOSE 165* 189* 285* 164*  BUN 35* 35* 35* 35*  CREATININE 2.09* 2.05* 2.29* 2.15*  CALCIUM 8.5* 8.8* 8.7* 8.4*  PHOS 3.4  --   --   --    Liver Function Tests: No results for input(s): AST, ALT, ALKPHOS, BILITOT, PROT, ALBUMIN in the last 168 hours. No results for input(s): LIPASE, AMYLASE in the last 168 hours. CBC: Recent Labs  Lab 01/16/22 0415 01/17/22 0357 01/18/22 0327 01/19/22 0708 01/20/22 0328  WBC 10.8* 11.0* 10.2 11.1* 10.4  HGB 9.5* 9.6* 8.7* 9.3* 8.9*  HCT 31.4* 31.2* 29.8* 29.6* 28.8*  MCV 106.8* 107.2* 108.4* 104.2* 104.7*  PLT 317 286 342 360 341   Blood Culture    Component Value Date/Time   SDES URINE, CLEAN CATCH 12/12/2021 1703   SPECREQUEST NONE 12/12/2021 1703   CULT  12/12/2021 1703    NO GROWTH Performed at [facility] [address], [place], [place] [address]    REPTSTATUS 12/13/2021 FINAL 12/12/2021 1703     Cardiac Enzymes: No results for input(s): CKTOTAL, CKMB, CKMBINDEX, TROPONINI in the last 168 hours. CBG: Recent Labs  Lab 01/19/22 0545 01/19/22 1120 01/19/22 1543 01/19/22 2102 01/20/22 0534  GLUCAP 194* 214* 214* 216* 164*   Iron Studies:  Recent Labs    01/19/22 0708  FERRITIN 32   @[email or web address] @ Studies/Results: US RENAL  Result Date: 01/19/2022 CLINICAL DATA:  Acute kidney injury EXAM: RENAL / URINARY TRACT ULTRASOUND COMPLETE COMPARISON:  Ultrasound 12/13/2021 FINDINGS: Right Kidney: Renal measurements: 9.7 x 5.2 x 4.7 cm = volume: 122.7 mL. Diffuse renal cortical thinning. No mass or hydronephrosis. Left Kidney: Renal measurements: 10.6 x 4.9 x 5.1 cm = volume: 137.1 mL. Diffuse renal cortical thinning. No mass or hydronephrosis. Bladder: Appears normal for degree of bladder distention. Other: Incidental note made of left pleural effusion. IMPRESSION: 1. Diffuse renal cortical thinning bilaterally consistent with atrophy and medical renal disease. No hydronephrosis 2. Left pleural effusion Electronically Signed   By: Donavan Foil M.D.   On: 01/19/2022 16:25   DG CHEST PORT 1 VIEW  Result Date: 01/19/2022 CLINICAL DATA:  Short of breath EXAM: PORTABLE CHEST 1 VIEW COMPARISON:  01/08/2022 FINDINGS: Cardiac enlargement. Mild vascular congestion.  Improvement in bibasilar airspace disease. No significant effusion. No significant pleural effusion identified. IMPRESSION: Mild vascular congestion without edema Improvement in bibasilar airspace disease.  Improved lung volumes. Electronically Signed   By: Franchot Gallo M.D.   On: 01/19/2022 10:40   Medications:  furosemide      apixaban  2.5 mg Oral BID   collagenase   Topical Daily   diltiazem  30 mg Oral Q6H   gabapentin  100 mg Oral BID   insulin aspart  0-20 Units Subcutaneous TID WC   insulin aspart  5 Units Subcutaneous TID WC   insulin glargine-yfgn  17 Units Subcutaneous QHS   metoprolol tartrate  25 mg Oral BID    pantoprazole  40 mg Oral BID   sertraline  100 mg Oral Daily   sodium chloride flush  3 mL Intravenous Q12H    Assessment/Plan **AKI on CKD:  longstanding CKD dating back to at least 2016; follows with [facility].  Baseline Cr 2022 was in the 1.2-1.4 range until sepsis admission in 11/2021. Cr more in the 1.5-1.7 range and now during admission for CHF running more in the 2+ range. Suspect cardiorenal main issue here.  Renal US c/w CKD, UA still needs to be sent.  BP not frankly low but question if AFib impacting renal perfusion to a degree.  At this point she has obligatory diuresis that needs to happen.  Didn't have any diuresis with lasix 80 IV yesterday, will increase to lasix 120IV BID today. Will hopefully be able to manage without major impact on kidney function.   Will follow daily labs.  Avoid nephrotoxic meds and events as able.    **AoC HFpEF: 11/2021 grade 2 DD.  Quite severe symptoms of volume overload.  She needs more aggressive diuresis. Diuretics per above, follow response and titrate lasix and add metolazone PRN as well.    **Anemia:  normal in 11/2021; melena and angiodysplatic colon lesions on colonoscopy s/p therapy.  Hb stable in the 9s now.  No indication for ESA.    **DM:  well controlled typically.  Meds per primary.    **A fib:  rate controlled on dilt and metoprolol.  On eliquis.    **H/o HTN: has been lower since A fib, off amlodipine now.     Will follow, contact with concerns.    Jannifer Hick MD 01/20/2022, 7:31 AM  [facility] Pager: [phone]

## 2022-01-20 NOTE — Progress Notes (Signed)
HD#11 Subjective:  Overnight Events: no events  Patient sitting on reclining chair during examination.  She appears comfortable and in no acute respiratory distress.  She reports good night sleep.  She reports no change in her respiratory status overnight.  RN reports an unrecorded urine output episode.   Objective:  Vital signs in last 24 hours: Vitals:   01/19/22 1400 01/19/22 2102 01/19/22 2354 01/20/22 0536  BP:  (!) 145/89 117/63 (!) 126/99  Pulse:   94 (!) 41  Resp: 20 20 (!) 24   Temp:  97.7 F (36.5 C)  97.8 F (36.6 C)  TempSrc:  Oral  Oral  SpO2: 97% 96% 96% 99%  Weight:    (!) 137.1 kg  Height:       Supplemental O2: Room Air SpO2: 99 % O2 Flow Rate (L/min): 2 L/min   Physical Exam:  Physical Exam Constitutional:      General: She is not in acute distress. HENT:     Head: Normocephalic.  Eyes:     General:        Right eye: No discharge.        Left eye: No discharge.     Conjunctiva/sclera: Conjunctivae normal.  Cardiovascular:     Rate and Rhythm: Normal rate and regular rhythm.     Comments: +1 LE edema  Pulmonary:     Effort: Pulmonary effort is normal. No respiratory distress.  Skin:    General: Skin is warm and dry.  Neurological:     Mental Status: She is alert.  Psychiatric:        Mood and Affect: Mood normal.    Filed Weights   01/18/22 0516 01/19/22 0420 01/20/22 0536  Weight: (!) 137.5 kg (!) 137.4 kg (!) 137.1 kg     Intake/Output Summary (Last 24 hours) at 01/20/2022 0650 Last data filed at 01/20/2022 7829 Gross per 24 hour  Intake 840 ml  Output 500 ml  Net 340 ml   Net IO Since Admission: -678 mL [01/20/22 0650]  Pertinent Labs: CBC Latest Ref Rng & Units 01/20/2022 01/19/2022 01/18/2022  WBC 4.0 - 10.5 K/uL 10.4 11.1(H) 10.2  Hemoglobin 12.0 - 15.0 g/dL 8.9(L) 9.3(L) 8.7(L)  Hematocrit 36.0 - 46.0 % 28.8(L) 29.6(L) 29.8(L)  Platelets 150 - 400 K/uL 341 360 342    CMP Latest Ref Rng & Units 01/20/2022 01/19/2022  01/19/2022  Glucose 70 - 99 mg/dL 164(H) 285(H) 189(H)  BUN 8 - 23 mg/dL 35(H) 35(H) 35(H)  Creatinine 0.44 - 1.00 mg/dL 2.15(H) 2.29(H) 2.05(H)  Sodium 135 - 145 mmol/L 139 139 141  Potassium 3.5 - 5.1 mmol/L 3.4(L) 4.0 3.8  Chloride 98 - 111 mmol/L 101 101 100  CO2 22 - 32 mmol/L 33(H) 30 32  Calcium 8.9 - 10.3 mg/dL 8.4(L) 8.7(L) 8.8(L)  Total Protein 6.5 - 8.1 g/dL - - -  Total Bilirubin 0.3 - 1.2 mg/dL - - -  Alkaline Phos 38 - 126 U/L - - -  AST 15 - 41 U/L - - -  ALT 0 - 44 U/L - - -    Imaging: US RENAL  Result Date: 01/19/2022 CLINICAL DATA:  Acute kidney injury EXAM: RENAL / URINARY TRACT ULTRASOUND COMPLETE COMPARISON:  Ultrasound 12/13/2021 FINDINGS: Right Kidney: Renal measurements: 9.7 x 5.2 x 4.7 cm = volume: 122.7 mL. Diffuse renal cortical thinning. No mass or hydronephrosis. Left Kidney: Renal measurements: 10.6 x 4.9 x 5.1 cm = volume: 137.1 mL. Diffuse renal cortical thinning. No  mass or hydronephrosis. Bladder: Appears normal for degree of bladder distention. Other: Incidental note made of left pleural effusion. IMPRESSION: 1. Diffuse renal cortical thinning bilaterally consistent with atrophy and medical renal disease. No hydronephrosis 2. Left pleural effusion Electronically Signed   By: Donavan Foil M.D.   On: 01/19/2022 16:25   DG CHEST PORT 1 VIEW  Result Date: 01/19/2022 CLINICAL DATA:  Short of breath EXAM: PORTABLE CHEST 1 VIEW COMPARISON:  01/08/2022 FINDINGS: Cardiac enlargement. Mild vascular congestion. Improvement in bibasilar airspace disease. No significant effusion. No significant pleural effusion identified. IMPRESSION: Mild vascular congestion without edema Improvement in bibasilar airspace disease.  Improved lung volumes. Electronically Signed   By: Franchot Gallo M.D.   On: 01/19/2022 10:40    Assessment/Plan:   Principal Problem:   Acute exacerbation of CHF (congestive heart failure) ([place]) Active Problems:   AKI (acute kidney injury) ([place])    Gastrointestinal hemorrhage   Persistent atrial fibrillation ([place])   Patient Summary: Katelyn Mahoney is a 88 y.o. with a pertinent PMH of Afib on Eliquis, HFpEF, CKD stage 3b, chronic respiratory failure on 4 L, obesity who was hospitalized for acute HFpEF exacerbation and acute on chronic anemia.   #Acute on chronic HFpEF exacerbation #Chronic respiratory failure, on 3-4 L at home She has 500 cc of urine output since yesterday but RN reports an unrecorded episode.  Her urine output is still not ideal, IV Lasix was increased to 120 mg twice daily per nephrology. Her respiratory status is stable. - Appreciate nephrology recommendations - IV Lasix 120 mg BID - Strict I&O's and daily weights - Trend BMP  - Unna boots    #AKI on CKD3b Suspect cardiorenal.  Renal ultrasound was negative for hydronephrosis.  Creatinine is trending down from 2.29-2.15, (baseline ~1.5-1.6 three weeks ago) - Continue IV Lasix - Pending UA - Trend renal function - Strict I&O as above - Avoid nephrotoxic agents   #Acute on chronic macrocytic anemia #Nonbleeding AVM EGD showed a single nonbleeding angiodysplastic lesion in the duodenum that was treated with monopolar probe. Colonoscopy showed 3 nonbleeding AVMs that was cauterized.   - Continue Eliquis - Protonix 40mg  po bid    #Atrial fibrillation Currently rate controlled. - Diltiazem 30mg  q6h  - Metoprolol 25mg  bid  - Continue Eliquis - Cardiac monitoring    #Diabetes mellitus with peripheral neuropathy  Patient is on 34U basal insulin and 6u tid meal time coverage at home. Most recent HbA1c 7.4.  Fasting CBG of 164. - Semglee 17U nightly - Continue NovoLog 5 units 3 times daily with meals - SSI with meals - Continue home gabapentin    Diet: Normal IVF: None,None VTE: NOAC Code: DNR PT/OT recs: SNF for Subacute PT, however patient would like home health PT   Dispo: Anticipated discharge to Home pending Nephrology eval, diuresis   Gaylan Gerold, DO 01/20/2022, 6:50 AM Pager: [phone]  Please contact the on call pager after 5 pm and on weekends at [phone].

## 2022-01-21 DIAGNOSIS — J9610 Chronic respiratory failure, unspecified whether with hypoxia or hypercapnia: Secondary | ICD-10-CM | POA: Diagnosis not present

## 2022-01-21 DIAGNOSIS — I5031 Acute diastolic (congestive) heart failure: Secondary | ICD-10-CM

## 2022-01-21 DIAGNOSIS — D539 Nutritional anemia, unspecified: Secondary | ICD-10-CM | POA: Diagnosis not present

## 2022-01-21 DIAGNOSIS — I4819 Other persistent atrial fibrillation: Secondary | ICD-10-CM | POA: Diagnosis not present

## 2022-01-21 LAB — URINALYSIS, ROUTINE W REFLEX MICROSCOPIC
Bilirubin Urine: NEGATIVE
Glucose, UA: NEGATIVE mg/dL
Hgb urine dipstick: NEGATIVE
Ketones, ur: NEGATIVE mg/dL
Nitrite: NEGATIVE
Protein, ur: NEGATIVE mg/dL
Specific Gravity, Urine: 1.008 (ref 1.005–1.030)
pH: 5 (ref 5.0–8.0)

## 2022-01-21 LAB — RENAL FUNCTION PANEL
Albumin: 2.5 g/dL — ABNORMAL LOW (ref 3.5–5.0)
Anion gap: 8 (ref 5–15)
BUN: 36 mg/dL — ABNORMAL HIGH (ref 8–23)
CO2: 32 mmol/L (ref 22–32)
Calcium: 8.5 mg/dL — ABNORMAL LOW (ref 8.9–10.3)
Chloride: 101 mmol/L (ref 98–111)
Creatinine, Ser: 1.98 mg/dL — ABNORMAL HIGH (ref 0.44–1.00)
GFR, Estimated: 24 mL/min — ABNORMAL LOW (ref >60)
Glucose, Bld: 195 mg/dL — ABNORMAL HIGH (ref 70–99)
Phosphorus: 3.2 mg/dL (ref 2.5–4.6)
Potassium: 3.8 mmol/L (ref 3.5–5.1)
Sodium: 141 mmol/L (ref 135–145)

## 2022-01-21 LAB — GLUCOSE, CAPILLARY
Glucose-Capillary: 180 mg/dL — ABNORMAL HIGH (ref 70–99)
Glucose-Capillary: 227 mg/dL — ABNORMAL HIGH (ref 70–99)
Glucose-Capillary: 182 mg/dL — ABNORMAL HIGH (ref 70–99)
Glucose-Capillary: 169 mg/dL — ABNORMAL HIGH (ref 70–99)

## 2022-01-21 MED ORDER — INSULIN ASPART 100 UNIT/ML IJ SOLN
7.0000 [IU] | Freq: Three times a day (TID) | INTRAMUSCULAR | Status: DC
  Administered 2022-01-21 – 2022-01-23 (×5): 7 [IU] via SUBCUTANEOUS

## 2022-01-21 MED ORDER — FUROSEMIDE 10 MG/ML IJ SOLN
120.0000 mg | Freq: Four times a day (QID) | INTRAVENOUS | Status: DC
  Administered 2022-01-21 – 2022-01-23 (×8): 120 mg via INTRAVENOUS
  Filled 2022-01-21: qty 12
  Filled 2022-01-21: qty 10
  Filled 2022-01-21 (×5): qty 12
  Filled 2022-01-21: qty 10
  Filled 2022-01-21: qty 12
  Filled 2022-01-21: qty 2

## 2022-01-21 MED ORDER — IPRATROPIUM-ALBUTEROL 0.5-2.5 (3) MG/3ML IN SOLN: 3.0000 mL | RESPIRATORY_TRACT | Status: DC | PRN

## 2022-01-21 NOTE — Progress Notes (Signed)
Mobility Specialist: Progress Note   01/21/22 1216  Mobility  Bed Position Chair  Activity Ambulated with assistance in room  Level of Assistance Standby assist, set-up cues, supervision of patient - no hands on  Assistive Device Front wheel walker  Distance Ambulated (ft) 84 ft (28'x3)  Activity Response Tolerated well  $Mobility charge 1 Mobility   Pre-Mobility: 88 HR, 97% SpO2 During Mobility: 97 HR, 93% SpO2 Post-Mobility: 96 HR, 99% SpO2  Pt ambulated on 4 L/min [name] with c/o feeling SOB requiring x2 seated breaks during mobility. Pt back to recliner after walk with call bell and phone in her lap. Pt set up with her lunch.   [facility] Katelyn Mahoney Mobility Specialist Mobility Specialist 4 [place]: [phone] Mobility Specialist 2 [place] and [place]: [phone]

## 2022-01-21 NOTE — Progress Notes (Signed)
HD#12 Subjective:  Overnight Events: none  Patient assessed at bedside this AM. She states that her breathing feels about the same. She was able to rest well overnight.  Objective:  Vital signs in last 24 hours: Vitals:   01/20/22 2133 01/20/22 2326 01/21/22 0418 01/21/22 0424  BP: 135/65 129/74 (!) 100/52   Pulse: 87  (!) 51   Resp:   (!) 22   Temp:   (!) 97.4 F (36.3 C)   TempSrc:   Oral   SpO2:   96%   Weight:    (!) 137.5 kg  Height:       Supplemental O2: Nasal Cannula SpO2: 96 % O2 Flow Rate (L/min): 2 L/min   Physical Exam:  Physical Exam HENT:     Head: Normocephalic.  Eyes:     General:        Right eye: No discharge.        Left eye: No discharge.     Conjunctiva/sclera: Conjunctivae normal.  Cardiovascular:     Rate and Rhythm: Normal rate and regular rhythm.     Heart sounds: Normal heart sounds.     Comments: +1 bilateral LE Pulmonary:     Effort: Pulmonary effort is normal.     Comments: Crackles at bilateral lung bases. Mild expiratory wheezing  Skin:    General: Skin is warm.  Neurological:     Mental Status: She is alert.  Psychiatric:        Mood and Affect: Mood normal.        Behavior: Behavior normal.    Filed Weights   01/19/22 0420 01/20/22 0536 01/21/22 0424  Weight: (!) 137.4 kg (!) 137.1 kg (!) 137.5 kg     Intake/Output Summary (Last 24 hours) at 01/21/2022 0553 Last data filed at 01/21/2022 0430 Gross per 24 hour  Intake 1363.9 ml  Output 1650 ml  Net -286.1 ml   Net IO Since Admission: -964.1 mL [01/21/22 0553]  Pertinent Labs: CBC Latest Ref Rng & Units 01/20/2022 01/19/2022 01/18/2022  WBC 4.0 - 10.5 K/uL 10.4 11.1(H) 10.2  Hemoglobin 12.0 - 15.0 g/dL 8.9(L) 9.3(L) 8.7(L)  Hematocrit 36.0 - 46.0 % 28.8(L) 29.6(L) 29.8(L)  Platelets 150 - 400 K/uL 341 360 342    CMP Latest Ref Rng & Units 01/21/2022 01/20/2022 01/19/2022  Glucose 70 - 99 mg/dL 195(H) 164(H) 285(H)  BUN 8 - 23 mg/dL 36(H) 35(H) 35(H)  Creatinine  0.44 - 1.00 mg/dL 1.98(H) 2.15(H) 2.29(H)  Sodium 135 - 145 mmol/L 141 139 139  Potassium 3.5 - 5.1 mmol/L 3.8 3.4(L) 4.0  Chloride 98 - 111 mmol/L 101 101 101  CO2 22 - 32 mmol/L 32 33(H) 30  Calcium 8.9 - 10.3 mg/dL 8.5(L) 8.4(L) 8.7(L)  Total Protein 6.5 - 8.1 g/dL - - -  Total Bilirubin 0.3 - 1.2 mg/dL - - -  Alkaline Phos 38 - 126 U/L - - -  AST 15 - 41 U/L - - -  ALT 0 - 44 U/L - - -    Imaging: No results found.  Assessment/Plan:   Principal Problem:   Acute exacerbation of CHF (congestive heart failure) ([facility]) Active Problems:   AKI (acute kidney injury) ([place])   Gastrointestinal hemorrhage   Persistent atrial fibrillation ([facility])   Patient Summary: Katelyn Mahoney is a 88 y.o. with a pertinent PMH of Afib on Eliquis, HFpEF, CKD stage 3b, chronic respiratory failure on 4 L, obesity who was hospitalized for acute HFpEF exacerbation and acute  on chronic anemia.    #Acute on chronic HFpEF exacerbation #Chronic respiratory failure, on 3-4 L at home She has 1650 cc of urine output since yesterday but her weight is trending up.  She still volume overloaded on exam with crackles heard on lungs auscultation.  IV Lasix was increased to 120 mg QID per nephrology.  - Appreciate nephrology recommendations - IV Lasix 120 mg QID.  - Strict I&O's and daily weights - Trend BMP  - Unna boots    #AKI on CKD3b Suspect cardiorenal.  Renal ultrasound was negative for hydronephrosis.  Creatinine is trending down from 2.15 to 1.98, (baseline ~1.5-1.6 three weeks ago) - Continue IV Lasix - Pending UA - Trend renal function - Strict I&O as above - Avoid nephrotoxic agents    #Acute on chronic macrocytic anemia #Nonbleeding AVM EGD showed a single nonbleeding angiodysplastic lesion in the duodenum that was treated with monopolar probe. Colonoscopy showed 3 nonbleeding AVMs that was cauterized.   - Continue Eliquis - Protonix 40mg  po bid    #Atrial fibrillation Currently rate  controlled. - Diltiazem 30mg  q6h  - Metoprolol 25mg  bid  - Continue Eliquis - Cardiac monitoring    #Diabetes mellitus with peripheral neuropathy  Patient is on 34U basal insulin and 6u tid meal time coverage at home. Most recent HbA1c 7.4.  Fasting CBG of 164. - Semglee 17U nightly - Increase NovoLog 7 units 3 times daily with meals - SSI with meals - Continue home gabapentin    Diet: Normal IVF: None,None VTE: NOAC Code: DNR PT/OT recs: SNF for Subacute PT, however patient would like home health PT   Dispo: Anticipated discharge to Home pending Nephrology eval, diuresis  Gaylan Gerold, DO 01/21/2022, 5:53 AM Pager: [phone]  Please contact the on call pager after 5 pm and on weekends at [phone].

## 2022-01-21 NOTE — Plan of Care (Signed)
°  Problem: Clinical Measurements: Goal: Ability to maintain clinical measurements within normal limits will improve Outcome: Progressing   Problem: Clinical Measurements: Goal: Respiratory complications will improve Outcome: Progressing   Problem: Clinical Measurements: Goal: Cardiovascular complication will be avoided Outcome: Progressing   Problem: Activity: Goal: Risk for activity intolerance will decrease Outcome: Progressing   Problem: Pain Managment: Goal: General experience of comfort will improve Outcome: Progressing   Problem: Safety: Goal: Ability to remain free from injury will improve Outcome: Progressing   Problem: Skin Integrity: Goal: Risk for impaired skin integrity will decrease Outcome: Progressing   Problem: Cardiac: Goal: Ability to achieve and maintain adequate cardiopulmonary perfusion will improve Outcome: Progressing

## 2022-01-21 NOTE — Progress Notes (Addendum)
[facility] Progress Note   Subjective:   Continues to feel poorly.  Slept in chair due to orthopnea.   I/Os yesterday 1360 / 1450 on lasix 120 BID yesterday.    Objective Vitals:   01/21/22 0418 01/21/22 0424 01/21/22 0600 01/21/22 0850  BP: (!) 100/52  138/63   Pulse: (!) 51  (!) 103   Resp: (!) 22     Temp: (!) 97.4 F (36.3 C)   97.8 F (36.6 C)  TempSrc: Oral   Oral  SpO2: 96%     Weight:  (!) 137.5 kg    Height:       PHYSICAL EXAM: Gen: in chair looking tired Eyes: anicteric ENT: MMM Neck: supple, ^ JVD at 45 degrees CV:  Reg S1, S2, irreg with PVCs on monitor, no rub Abd:  soft obese Lungs: dec BS bases, ^ WOB with conversation GU: purewick with yellow urine in can Extr: Unna wraps to LE, 1+ pitting in thighs Neuro: conversant, nonfocal  Additional Objective Labs: Basic Metabolic Panel: Recent Labs  Lab 01/18/22 0327 01/19/22 0708 01/19/22 1422 01/20/22 0328 01/21/22 0146  NA 140   < > 139 139 141  K 3.4*   < > 4.0 3.4* 3.8  CL 102   < > 101 101 101  CO2 31   < > 30 33* 32  GLUCOSE 165*   < > 285* 164* 195*  BUN 35*   < > 35* 35* 36*  CREATININE 2.09*   < > 2.29* 2.15* 1.98*  CALCIUM 8.5*   < > 8.7* 8.4* 8.5*  PHOS 3.4  --   --   --  3.2   < > = values in this interval not displayed.    Liver Function Tests: Recent Labs  Lab 01/21/22 0146  ALBUMIN 2.5*   No results for input(s): LIPASE, AMYLASE in the last 168 hours. CBC: Recent Labs  Lab 01/16/22 0415 01/17/22 0357 01/18/22 0327 01/19/22 0708 01/20/22 0328  WBC 10.8* 11.0* 10.2 11.1* 10.4  HGB 9.5* 9.6* 8.7* 9.3* 8.9*  HCT 31.4* 31.2* 29.8* 29.6* 28.8*  MCV 106.8* 107.2* 108.4* 104.2* 104.7*  PLT 317 286 342 360 341    Blood Culture    Component Value Date/Time   SDES URINE, CLEAN CATCH 12/12/2021 1703   SPECREQUEST NONE 12/12/2021 1703   CULT  12/12/2021 1703    NO GROWTH Performed at [facility] [address] [place], [place] [address]     REPTSTATUS 12/13/2021 FINAL 12/12/2021 1703    Cardiac Enzymes: No results for input(s): CKTOTAL, CKMB, CKMBINDEX, TROPONINI in the last 168 hours. CBG: Recent Labs  Lab 01/20/22 0534 01/20/22 1221 01/20/22 1538 01/20/22 2137 01/21/22 0601  GLUCAP 164* 214* 191* 188* 180*    Iron Studies:  Recent Labs    01/19/22 0708  FERRITIN 32    @[email or web address] @ Studies/Results: US RENAL  Result Date: 01/19/2022 CLINICAL DATA:  Acute kidney injury EXAM: RENAL / URINARY TRACT ULTRASOUND COMPLETE COMPARISON:  Ultrasound 12/13/2021 FINDINGS: Right Kidney: Renal measurements: 9.7 x 5.2 x 4.7 cm = volume: 122.7 mL. Diffuse renal cortical thinning. No mass or hydronephrosis. Left Kidney: Renal measurements: 10.6 x 4.9 x 5.1 cm = volume: 137.1 mL. Diffuse renal cortical thinning. No mass or hydronephrosis. Bladder: Appears normal for degree of bladder distention. Other: Incidental note made of left pleural effusion. IMPRESSION: 1. Diffuse renal cortical thinning bilaterally consistent with atrophy and medical renal disease. No hydronephrosis 2. Left pleural effusion Electronically Signed  By: Donavan Foil M.D.   On: 01/19/2022 16:25   DG CHEST PORT 1 VIEW  Result Date: 01/19/2022 CLINICAL DATA:  Short of breath EXAM: PORTABLE CHEST 1 VIEW COMPARISON:  01/08/2022 FINDINGS: Cardiac enlargement. Mild vascular congestion. Improvement in bibasilar airspace disease. No significant effusion. No significant pleural effusion identified. IMPRESSION: Mild vascular congestion without edema Improvement in bibasilar airspace disease.  Improved lung volumes. Electronically Signed   By: Franchot Gallo M.D.   On: 01/19/2022 10:40   Medications:  furosemide      apixaban  2.5 mg Oral BID   collagenase   Topical Daily   diltiazem  30 mg Oral Q6H   gabapentin  100 mg Oral BID   insulin aspart  0-20 Units Subcutaneous TID WC   insulin aspart  5 Units Subcutaneous TID WC   insulin glargine-yfgn  17 Units Subcutaneous  QHS   metoprolol tartrate  25 mg Oral BID   pantoprazole  40 mg Oral BID   sertraline  100 mg Oral Daily   sodium chloride flush  3 mL Intravenous Q12H    Assessment/Plan **AKI on CKD:  longstanding CKD dating back to at least 2016; follows with [facility].  Baseline Cr 2022 was in the 1.2-1.4 range until sepsis admission in 11/2021. Cr more in the 1.5-1.7 range and now during admission for CHF running more in the 2+ range. Suspect cardiorenal main issue here.  Renal US c/w CKD, UA still needs to be sent so I reordered.  BP not frankly low but question if AFib impacting renal perfusion to a degree.  At this point she has obligatory diuresis that needs to happen.  UOP ^d with lasix 120 IV BID yesterday but still no net diuresis -- increase to 120 QID today.  Can ^ further or add metolazone PRN. Will hopefully be able to manage without major impact on kidney function -- Cr stable at 2 today.   Will follow daily labs.  Avoid nephrotoxic meds and events as able.    **AoC HFpEF: 11/2021 grade 2 DD.  Quite severe symptoms of volume overload.  She needs more aggressive diuresis. Diuretics per above, follow response and titrate lasix and add metolazone PRN as well.  On low na diet, further fluid restrict to 1249mL/day.    **Anemia:  normal in 11/2021; melena and angiodysplatic colon lesions on colonoscopy s/p therapy.  Hb stable in the 9s now.  No indication for ESA.    **DM:  well controlled typically.  Meds per primary.    **A fib:  rate controlled on dilt and metoprolol.  On eliquis.    **H/o HTN: has been lower since A fib, off amlodipine now.     Will follow, contact with concerns.    Jannifer Hick MD 01/21/2022, 9:46 AM  [facility] Pager: [phone]

## 2022-01-22 ENCOUNTER — Inpatient Hospital Stay (HOSPITAL_COMMUNITY): Payer: Medicare Other

## 2022-01-22 DIAGNOSIS — I5033 Acute on chronic diastolic (congestive) heart failure: Secondary | ICD-10-CM

## 2022-01-22 DIAGNOSIS — D539 Nutritional anemia, unspecified: Secondary | ICD-10-CM | POA: Diagnosis not present

## 2022-01-22 DIAGNOSIS — I4891 Unspecified atrial fibrillation: Secondary | ICD-10-CM | POA: Diagnosis not present

## 2022-01-22 DIAGNOSIS — J9610 Chronic respiratory failure, unspecified whether with hypoxia or hypercapnia: Secondary | ICD-10-CM | POA: Diagnosis not present

## 2022-01-22 LAB — RENAL FUNCTION PANEL
Albumin: 2.5 g/dL — ABNORMAL LOW (ref 3.5–5.0)
Anion gap: 7 (ref 5–15)
BUN: 39 mg/dL — ABNORMAL HIGH (ref 8–23)
CO2: 34 mmol/L — ABNORMAL HIGH (ref 22–32)
Calcium: 8.5 mg/dL — ABNORMAL LOW (ref 8.9–10.3)
Chloride: 101 mmol/L (ref 98–111)
Creatinine, Ser: 1.96 mg/dL — ABNORMAL HIGH (ref 0.44–1.00)
GFR, Estimated: 25 mL/min — ABNORMAL LOW (ref >60)
Glucose, Bld: 219 mg/dL — ABNORMAL HIGH (ref 70–99)
Phosphorus: 3.3 mg/dL (ref 2.5–4.6)
Potassium: 3.4 mmol/L — ABNORMAL LOW (ref 3.5–5.1)
Sodium: 142 mmol/L (ref 135–145)

## 2022-01-22 LAB — CBC
HCT: 29.4 % — ABNORMAL LOW (ref 36.0–46.0)
Hemoglobin: 9.2 g/dL — ABNORMAL LOW (ref 12.0–15.0)
MCH: 32.7 pg (ref 26.0–34.0)
MCHC: 31.3 g/dL (ref 30.0–36.0)
MCV: 104.6 fL — ABNORMAL HIGH (ref 80.0–100.0)
Platelets: 387 10*3/uL (ref 150–400)
RBC: 2.81 MIL/uL — ABNORMAL LOW (ref 3.87–5.11)
RDW: 15.2 % (ref 11.5–15.5)
WBC: 10.7 10*3/uL — ABNORMAL HIGH (ref 4.0–10.5)
nRBC: 0.5 % — ABNORMAL HIGH (ref 0.0–0.2)

## 2022-01-22 LAB — ECHOCARDIOGRAM COMPLETE
AR max vel: 1.21 cm2
AV Area VTI: 1.16 cm2
AV Area mean vel: 1.32 cm2
AV Mean grad: 7 mmHg
AV Peak grad: 13.8 mmHg
Ao pk vel: 1.86 m/s
Height: 66 in
MV M vel: 4.78 m/s
MV Peak grad: 91.4 mmHg
Radius: 0.7 cm
S' Lateral: 3.3 cm
Weight: 4843.07 oz

## 2022-01-22 LAB — GLUCOSE, CAPILLARY
Glucose-Capillary: 199 mg/dL — ABNORMAL HIGH (ref 70–99)
Glucose-Capillary: 233 mg/dL — ABNORMAL HIGH (ref 70–99)
Glucose-Capillary: 178 mg/dL — ABNORMAL HIGH (ref 70–99)
Glucose-Capillary: 157 mg/dL — ABNORMAL HIGH (ref 70–99)

## 2022-01-22 MED ORDER — METOLAZONE 2.5 MG PO TABS
2.5000 mg | ORAL_TABLET | Freq: Every day | ORAL | Status: DC
  Administered 2022-01-22 – 2022-01-23 (×2): 2.5 mg via ORAL
  Filled 2022-01-22 (×2): qty 1

## 2022-01-22 MED ORDER — INSULIN GLARGINE-YFGN 100 UNIT/ML ~~LOC~~ SOLN
20.0000 [IU] | Freq: Every day | SUBCUTANEOUS | Status: DC
  Administered 2022-01-22 – 2022-01-25 (×4): 20 [IU] via SUBCUTANEOUS
  Filled 2022-01-22 (×5): qty 0.2

## 2022-01-22 MED ORDER — POTASSIUM CHLORIDE 20 MEQ PO PACK
40.0000 meq | PACK | Freq: Two times a day (BID) | ORAL | Status: DC
  Filled 2022-01-22: qty 2

## 2022-01-22 MED ORDER — DARBEPOETIN ALFA 60 MCG/0.3ML IJ SOSY
60.0000 ug | PREFILLED_SYRINGE | INTRAMUSCULAR | Status: DC
  Filled 2022-01-22: qty 0.3

## 2022-01-22 MED ORDER — POTASSIUM CHLORIDE CRYS ER 20 MEQ PO TBCR
40.0000 meq | EXTENDED_RELEASE_TABLET | Freq: Two times a day (BID) | ORAL | Status: AC
  Administered 2022-01-22 (×2): 40 meq via ORAL
  Filled 2022-01-22 (×2): qty 2

## 2022-01-22 NOTE — Progress Notes (Signed)
Orthopedic Tech Progress Note Patient Details:  Katelyn Mahoney 1934/05/25 [id number]  Ortho Devices Type of Ortho Device: Louretta Parma boot Ortho Device/Splint Location: BLE Ortho Device/Splint Interventions: Ordered, Application   Post Interventions Patient Tolerated: Well Instructions Provided: Care of device  [name] 01/22/2022, 4:05 PM

## 2022-01-22 NOTE — Plan of Care (Signed)
°  Problem: Education: Goal: Knowledge of General Education information will improve Description: Including pain rating scale, medication(s)/side effects and non-pharmacologic comfort measures Outcome: Progressing   Problem: Clinical Measurements: Goal: Ability to maintain clinical measurements within normal limits will improve Outcome: Progressing   Problem: Clinical Measurements: Goal: Respiratory complications will improve Outcome: Progressing   Problem: Clinical Measurements: Goal: Cardiovascular complication will be avoided Outcome: Progressing   Problem: Activity: Goal: Risk for activity intolerance will decrease Outcome: Progressing   Problem: Safety: Goal: Ability to remain free from injury will improve Outcome: Progressing   Problem: Skin Integrity: Goal: Risk for impaired skin integrity will decrease Outcome: Progressing   Problem: Cardiac: Goal: Ability to achieve and maintain adequate cardiopulmonary perfusion will improve Outcome: Progressing

## 2022-01-22 NOTE — Progress Notes (Signed)
Occupational Therapy Treatment Patient Details Name: Katelyn Mahoney MRN: [id number] DOB: August 21, 1934 Today's Date: 01/22/2022   History of present illness Pt is an 88 y.o. female who presented 01/08/22 with SOB and weight gain. Pt admitted for acute on chronic HFpEF exacerbation. Pt also with melenic stools/heme positive, plan for EGD 01/10/22. S/p colonoscopy 1/13. PMH includes DM II, HTN, obesity, CKD, HLD, chronic LE edema, chronic hypoxic respiratory failure on 4L oxygen at home, Afib, HfpEF   OT comments  Patient continues to making minimal progress towards goals in skilled OT session. Patient's session encompassed sit<>stands from the recliner and ADLs. Patient having difficulty in session with increased RR and HR (Afib fluctuating) as well as shortness of breath. Patient with stable O2 readings on 4L of oxygen, but continued to endorse decreased activity tolerance. Patient also affirming that she wants to go to a facility to get stronger, as she knows she cannot go home currently (case management notified). RN present at end of session to monitor HR and provide medications, therapy will continue to follow acutely.    Recommendations for follow up therapy are one component of a multi-disciplinary discharge planning process, led by the attending physician.  Recommendations may be updated based on patient status, additional functional criteria and insurance authorization.    Follow Up Recommendations  Skilled nursing-short term rehab (<3 hours/day)    Assistance Recommended at Discharge Intermittent Supervision/Assistance  Patient can return home with the following  Help with stairs or ramp for entrance;A little help with bathing/dressing/bathroom;A little help with walking and/or transfers;Assistance with cooking/housework;Direct supervision/assist for financial management;Direct supervision/assist for medications management   Equipment Recommendations  None recommended by OT     Recommendations for Other Services      Precautions / Restrictions Precautions Precautions: Fall;Other (comment) Precaution Comments: monitor SpO2, HR and RR, 4L O2 at baseline Restrictions Weight Bearing Restrictions: No       Mobility Bed Mobility               General bed mobility comments: up in chair    Transfers Overall transfer level: Needs assistance Equipment used: Rolling walker (2 wheels) Transfers: Sit to/from Stand Sit to Stand: Min guard           General transfer comment: Extra time and pt rocking to gain momentum, pushing up from chair to stand at RW 2x, min guard for safety, no LOB.     Balance Overall balance assessment: Needs assistance Sitting-balance support: No upper extremity supported Sitting balance-Leahy Scale: Fair Sitting balance - Comments: supervision   Standing balance support: During functional activity, Reliant on assistive device for balance, Bilateral upper extremity supported Standing balance-Leahy Scale: Fair Standing balance comment: More fatigued in session to date, with increased RR and DOE, able to stand and march in place for about 15 seconds before needing seated rest break                           ADL either performed or assessed with clinical judgement   ADL Overall ADL's : Needs assistance/impaired Eating/Feeding: Independent   Grooming: Wash/dry hands;Wash/dry face;Oral care;Set up                               Functional mobility during ADLs: Min guard;Rolling walker (2 wheels) General ADL Comments: Extra time and effort for transfers and mobility. Increased DOE when completing sit<>stands limiting progress with functional ambulation  Extremity/Trunk Assessment              Vision       Perception     Praxis      Cognition Arousal/Alertness: Awake/alert Behavior During Therapy: WFL for tasks assessed/performed Overall Cognitive Status: Within Functional Limits for  tasks assessed                                          Exercises      Shoulder Instructions       General Comments      Pertinent Vitals/ Pain       Pain Assessment Pain Assessment: Faces Faces Pain Scale: Hurts little more Pain Location: generalized with mobility Pain Descriptors / Indicators: Discomfort, Grimacing, Guarding Pain Intervention(s): Limited activity within patient's tolerance, Monitored during session, Repositioned  Home Living                                          Prior Functioning/Environment              Frequency  Min 2X/week        Progress Toward Goals  OT Goals(current goals can now be found in the care plan section)  Progress towards OT goals: Progressing toward goals  Acute Rehab OT Goals Patient Stated Goal: get stronger OT Goal Formulation: With patient Time For Goal Achievement: 01/24/22 Potential to Achieve Goals: [place] Discharge plan remains appropriate;Frequency remains appropriate    Co-evaluation                 AM-PAC OT "6 Clicks" Daily Activity     Outcome Measure   Help from another person eating meals?: None Help from another person taking care of personal grooming?: A Little Help from another person toileting, which includes using toliet, bedpan, or urinal?: A Little Help from another person bathing (including washing, rinsing, drying)?: A Lot Help from another person to put on and taking off regular upper body clothing?: A Little Help from another person to put on and taking off regular lower body clothing?: A Lot 6 Click Score: 17    End of Session Equipment Utilized During Treatment: Oxygen;Rolling walker (2 wheels);Gait belt  OT Visit Diagnosis: Unsteadiness on feet (R26.81);Muscle weakness (generalized) (M62.81)   Activity Tolerance Patient tolerated treatment well (Increased RR and HR with minimal movement RN aware and present at end of session)    Patient Left in chair;with call bell/phone within reach   Nurse Communication Mobility status;Other (comment) (Increased RR and HR during session, RN present at end of session)        Time: 0939-1010 OT Time Calculation (min): 31 min  Charges: OT General Charges $OT Visit: 1 Visit OT Treatments $Self Care/Home Management : 23-37 mins  Corinne Ports E. [place] Millon, OTR/L Acute Rehabilitation Services [phone] [place] 01/22/2022, 11:11 AM

## 2022-01-22 NOTE — Progress Notes (Signed)
HD#13 Subjective:  Overnight Events: none  Patient assessed at bedside this AM. She states that her breathing feels somewhat worse, but her swelling has improved since she first got to the hospital. Had difficulty sleeping overnight but otherwise does not endorse any other complaints or concerns today.   Objective:  Vital signs in last 24 hours: Vitals:   01/22/22 0012 01/22/22 0306 01/22/22 0503 01/22/22 1006  BP:  121/61 128/63 (!) 119/52  Pulse:  88 98 96  Resp:  20 19   Temp:  98 F (36.7 C) 97.7 F (36.5 C)   TempSrc:  Oral Oral   SpO2:  98% 91%   Weight: (!) 137.3 kg     Height:       Supplemental O2: Nasal Cannula SpO2: 91 % O2 Flow Rate (L/min): 4 L/min  Physical Exam:  Physical Exam HENT:     Head: Normocephalic.  Eyes:     General:        Right eye: No discharge.        Left eye: No discharge.     Conjunctiva/sclera: Conjunctivae normal.  Cardiovascular:     Rate and Rhythm: Normal rate and regular rhythm.     Heart sounds: Normal heart sounds.     Comments: +1 bilateral LE Pulmonary:     Effort: Pulmonary effort is normal.     Comments: Crackles at bilateral lung bases. Mild expiratory wheezing  Skin:    General: Skin is warm.  Neurological:     Mental Status: She is alert.  Psychiatric:        Mood and Affect: Mood normal.        Behavior: Behavior normal.    Filed Weights   01/20/22 0536 01/21/22 0424 01/22/22 0012  Weight: (!) 137.1 kg (!) 137.5 kg (!) 137.3 kg     Intake/Output Summary (Last 24 hours) at 01/22/2022 1044 Last data filed at 01/22/2022 0906 Gross per 24 hour  Intake 982.07 ml  Output 1200 ml  Net -217.93 ml   Net IO Since Admission: -1,062.03 mL [01/22/22 1044]  Pertinent Labs: CBC Latest Ref Rng & Units 01/22/2022 01/20/2022 01/19/2022  WBC 4.0 - 10.5 K/uL 10.7(H) 10.4 11.1(H)  Hemoglobin 12.0 - 15.0 g/dL 9.2(L) 8.9(L) 9.3(L)  Hematocrit 36.0 - 46.0 % 29.4(L) 28.8(L) 29.6(L)  Platelets 150 - 400 K/uL 387 341 360     CMP Latest Ref Rng & Units 01/22/2022 01/21/2022 01/20/2022  Glucose 70 - 99 mg/dL 219(H) 195(H) 164(H)  BUN 8 - 23 mg/dL 39(H) 36(H) 35(H)  Creatinine 0.44 - 1.00 mg/dL 1.96(H) 1.98(H) 2.15(H)  Sodium 135 - 145 mmol/L 142 141 139  Potassium 3.5 - 5.1 mmol/L 3.4(L) 3.8 3.4(L)  Chloride 98 - 111 mmol/L 101 101 101  CO2 22 - 32 mmol/L 34(H) 32 33(H)  Calcium 8.9 - 10.3 mg/dL 8.5(L) 8.5(L) 8.4(L)  Total Protein 6.5 - 8.1 g/dL - - -  Total Bilirubin 0.3 - 1.2 mg/dL - - -  Alkaline Phos 38 - 126 U/L - - -  AST 15 - 41 U/L - - -  ALT 0 - 44 U/L - - -    Imaging: No results found.  Assessment/Plan:   Principal Problem:   Acute exacerbation of CHF (congestive heart failure) (HCC) Active Problems:   AKI (acute kidney injury) ([place])   Gastrointestinal hemorrhage   Persistent atrial fibrillation [facility])   Patient Summary: Katelyn Mahoney is a 88 y.o. with a pertinent PMH of Afib on Eliquis, HFpEF,  CKD stage 3b, chronic respiratory failure on 4 L, obesity who was hospitalized for acute HFpEF exacerbation and acute on chronic anemia.   #Acute on chronic HFpEF exacerbation #Chronic respiratory failure, on 3-4 L at home She has 1200 cc output + 3 unmeasured voids since yesterday but her weight is trending up.  She still volume overloaded on exam though overall much improved since she was first admitted.  IV Lasix was recently increased to 120 mg QID per nephrology. Added metolazone 2.5 mg BID today.  We will also obtain echo today for further evaluation of her volume status. - Appreciate nephrology recommendations - Echocardiogram pending - Continue IV Lasix 120 mg QID - Start metolazone 2.5 mg twice daily today - Strict I&O's and daily weights - Trend BMP  - Unna boots    #AKI on CKD3b Suspect cardiorenal. Renal ultrasound was negative for hydronephrosis. Creatinine is trending down from 1.98 to 1.96 (baseline ~1.5-1.6 three weeks ago). - Continue IV Lasix, metolazone - Pending UA  and microalbumin/cr ratio - Trend renal function - Strict I&O's as above - Avoid nephrotoxic agents    #Acute on chronic macrocytic anemia #Nonbleeding AVM EGD showed a single nonbleeding angiodysplastic lesion in the duodenum that was treated with monopolar probe. Colonoscopy showed 3 nonbleeding AVMs that was cauterized.  Patient is hemodynamically stable with stable hemoglobin values. - Continue Eliquis - Protonix 40mg  po bid    #Atrial fibrillation Currently rate-controlled. - Diltiazem 30mg  q6h  - Metoprolol 25mg  bid  - Continue Eliquis - Cardiac monitoring    #Diabetes mellitus with peripheral neuropathy  Patient is on 34U basal insulin and 6u tid meal time coverage at home. Most recent HbA1c 7.4.  Fasting CBG of 199. - Increased Semglee to 20U nightly - Continue NovoLog 7 units 3 times daily with meals - SSI with meals - Continue home gabapentin    Diet: Normal IVF: None,None VTE: NOAC Code: DNR PT/OT recs: SNF for Subacute PT, however patient would like home health PT   Dispo: Anticipated discharge to Home pending Nephrology eval, diuresis  Orvis Brill, MD 01/22/2022, 10:44 AM Pager: [phone]  Please contact the on call pager after 5 pm and on weekends at [phone].

## 2022-01-22 NOTE — Progress Notes (Signed)
PT Cancellation Note  Patient Details Name: Katelyn Mahoney MRN: [id number] DOB: Dec 14, 1934   Cancelled Treatment:    Reason Eval/Treat Not Completed: Patient at procedure or test/unavailable.  In echo procedure then with another staff member.  Follow up as time and pt allow.   Ramond Dial 01/22/2022, 3:09 PM  Mee Hives, PT PhD Acute Rehab Dept. Number: [place] and [place]

## 2022-01-22 NOTE — Progress Notes (Signed)
At 1024 patient had a 5 beat run of V-tach unsustained while sitting in the chair talking with nurse.

## 2022-01-22 NOTE — Progress Notes (Signed)
[facility] Progress Note   Subjective:    I/Os yesterday 862 / 1200 on lasix 120 q 6 hours yesterday.  Getting echo today.  She did not get any sleep due to monitor going off and is still SOB  Objective Vitals:   01/22/22 0012 01/22/22 0306 01/22/22 0503 01/22/22 1006  BP:  121/61 128/63 (!) 119/52  Pulse:  88 98 96  Resp:  20 19   Temp:  98 F (36.7 C) 97.7 F (36.5 C)   TempSrc:  Oral Oral   SpO2:  98% 91%   Weight: (!) 137.3 kg     Height:       PHYSICAL EXAM: Gen: in chair looking tired Eyes: anicteric ENT: MMM Neck: supple, ^ JVD at 45 degrees CV:  Reg S1, S2, irreg with PVCs on monitor, no rub Abd:  soft obese Lungs: dec BS bases, ^ WOB with conversation GU: purewick with yellow urine in can Extr: Unna wraps to LE, 1+ pitting in thighs Neuro: conversant, nonfocal  Additional Objective Labs: Basic Metabolic Panel: Recent Labs  Lab 01/18/22 0327 01/19/22 0708 01/20/22 0328 01/21/22 0146 01/22/22 0427  NA 140   < > 139 141 142  K 3.4*   < > 3.4* 3.8 3.4*  CL 102   < > 101 101 101  CO2 31   < > 33* 32 34*  GLUCOSE 165*   < > 164* 195* 219*  BUN 35*   < > 35* 36* 39*  CREATININE 2.09*   < > 2.15* 1.98* 1.96*  CALCIUM 8.5*   < > 8.4* 8.5* 8.5*  PHOS 3.4  --   --  3.2 3.3   < > = values in this interval not displayed.   Liver Function Tests: Recent Labs  Lab 01/21/22 0146 01/22/22 0427  ALBUMIN 2.5* 2.5*   No results for input(s): LIPASE, AMYLASE in the last 168 hours. CBC: Recent Labs  Lab 01/17/22 0357 01/18/22 0327 01/19/22 0708 01/20/22 0328 01/22/22 0427  WBC 11.0* 10.2 11.1* 10.4 10.7*  HGB 9.6* 8.7* 9.3* 8.9* 9.2*  HCT 31.2* 29.8* 29.6* 28.8* 29.4*  MCV 107.2* 108.4* 104.2* 104.7* 104.6*  PLT 286 342 360 341 387   Blood Culture    Component Value Date/Time   SDES URINE, CLEAN CATCH 12/12/2021 1703   SPECREQUEST NONE 12/12/2021 1703   CULT  12/12/2021 1703    NO GROWTH Performed at [facility] [address], [place], [place] [address]    REPTSTATUS 12/13/2021 FINAL 12/12/2021 1703    Cardiac Enzymes: No results for input(s): CKTOTAL, CKMB, CKMBINDEX, TROPONINI in the last 168 hours. CBG: Recent Labs  Lab 01/21/22 0601 01/21/22 1142 01/21/22 1622 01/21/22 2119 01/22/22 0557  GLUCAP 180* 227* 182* 169* 199*   Iron Studies:  No results for input(s): IRON, TIBC, TRANSFERRIN, FERRITIN in the last 72 hours. @lablastinr3 @ Studies/Results: No results found. Medications:  furosemide 120 mg (01/22/22 0935)    apixaban  2.5 mg Oral BID   collagenase   Topical Daily   diltiazem  30 mg Oral Q6H   gabapentin  100 mg Oral BID   insulin aspart  0-20 Units Subcutaneous TID WC   insulin aspart  7 Units Subcutaneous TID WC   insulin glargine-yfgn  20 Units Subcutaneous QHS   metoprolol tartrate  25 mg Oral BID   pantoprazole  40 mg Oral BID   potassium chloride  40 mEq Oral BID   sertraline  100 mg Oral  Daily   sodium chloride flush  3 mL Intravenous Q12H    Assessment/Plan **AKI on CKD:  longstanding CKD dating back to at least 2016; follows with [facility].  Baseline Cr 2022 was in the 1.2-1.4 range until sepsis admission in 11/2021. Cr more in the 1.5-1.7 range and now during admission for CHF running more in the 2+ range. Suspect cardiorenal main issue here.  Renal US c/w CKD, UA bland.  BP not frankly low but question if AFib impacting renal perfusion to a degree.  At this point she has obligatory diuresis that needs to happen.  UOP ^d with lasix -  did achieve diuresis with 120 QID but barely.  Can add metolazone. Will hopefully be able to manage without major impact on kidney function -- Cr stable at 2 today.   Will follow daily labs.  Avoid nephrotoxic meds and events as able.    **AoC HFpEF: 11/2021 grade 2 DD.  Quite severe symptoms of volume overload.  She needs more aggressive diuresis. Diuretics per above, follow response and add metolazone today as well.  On low na diet,  further fluid restrict to 1267mL/day. Assessing with echo today    **Anemia:  normal in 11/2021; melena and angiodysplatic colon lesions on colonoscopy s/p therapy.  Hb stable in the 9s now.  will add ESA and check iron stores.    **DM:  well controlled typically.  Meds per primary.    **A fib:  rate controlled on low dose dilt and metoprolol.  On eliquis.    **H/o HTN: has been lower since A fib, off amlodipine now.  Hypokalemia-  stable to improved on 40 BID repletion  - no change       Louis Meckel  01/22/2022, 10:20 AM  [facility] Pager: [phone]

## 2022-01-22 NOTE — TOC Progression Note (Addendum)
Transition of Care [facility]) - Progression Note    Patient Details  Name: Katelyn Mahoney MRN: [id number] Date of Birth: [birth year]  Transition of Care [facility]) CM/SW [place], [place] Phone Number: 01/22/2022, 11:27 AM  Clinical Narrative:    Pt continues to request [facility] SNF for DC option and will not go to any other SNF. Per CSW note on 12/20/2021, CSW contacted [facility] for bed offer and was informed pt insurance was not in network; pt was informed. CSW on 01/22/2022 confirmed pt insurance is not in network. Pt will go home with [facility].   Expected Discharge Plan: [place] Barriers to Discharge: Continued Medical Work up  Expected Discharge Plan and Services Expected Discharge Plan: [place] In-house Referral: Clinical Social Work   Post Acute Care Choice: [place] Living arrangements for the past 2 months: [place]                   DME Agency: NA       HH Arranged: Therapist, sports, PT, Nurse's Aide, Social Work [employer] Agency: [place] Date [place]: 01/17/22 Time [place]: [place] Representative spoke with at [place]: [place] Determinants of Health ([place]) Interventions    Readmission Risk Interventions No flowsheet data found.

## 2022-01-22 NOTE — Progress Notes (Signed)
Mobility Specialist: Progress Note   01/22/22 1519  Mobility  Bed Position Chair  Activity  (STS x3)  Level of Assistance Modified independent, requires aide device or extra time  Assistive Device Front wheel walker  Activity Response Tolerated fair  $Mobility charge 1 Mobility   Pre-Mobility: 75 HR, 93% SpO2 During Mobility: 95 HR, 93% SpO2  Pt c/o not feeling well compared to yesterday so had pt perform STS x3, marching in place with short seated breaks between each bout. Pt c/o feeling SOB as well as bilateral knee pain, no rating given. Pt back to recliner after mobility with call bell and phone in her lap.   [facility] Katelyn Mahoney Mobility Specialist Mobility Specialist 4 [place]: [phone] Mobility Specialist 2 [place] and [place]: [phone]

## 2022-01-23 DIAGNOSIS — J9610 Chronic respiratory failure, unspecified whether with hypoxia or hypercapnia: Secondary | ICD-10-CM | POA: Diagnosis not present

## 2022-01-23 DIAGNOSIS — D539 Nutritional anemia, unspecified: Secondary | ICD-10-CM | POA: Diagnosis not present

## 2022-01-23 DIAGNOSIS — I5033 Acute on chronic diastolic (congestive) heart failure: Secondary | ICD-10-CM | POA: Diagnosis not present

## 2022-01-23 DIAGNOSIS — I4891 Unspecified atrial fibrillation: Secondary | ICD-10-CM | POA: Diagnosis not present

## 2022-01-23 LAB — MICROALBUMIN / CREATININE URINE RATIO
Creatinine, Urine: 34.9 mg/dL
Microalb Creat Ratio: 15 mg/g creat (ref 0–29)
Microalb, Ur: 5.4 ug/mL — ABNORMAL HIGH

## 2022-01-23 LAB — CBC
HCT: 28.5 % — ABNORMAL LOW (ref 36.0–46.0)
Hemoglobin: 8.6 g/dL — ABNORMAL LOW (ref 12.0–15.0)
MCH: 30.9 pg (ref 26.0–34.0)
MCHC: 30.2 g/dL (ref 30.0–36.0)
MCV: 102.5 fL — ABNORMAL HIGH (ref 80.0–100.0)
Platelets: 373 10*3/uL (ref 150–400)
RBC: 2.78 MIL/uL — ABNORMAL LOW (ref 3.87–5.11)
RDW: 15.3 % (ref 11.5–15.5)
WBC: 10.1 10*3/uL (ref 4.0–10.5)
nRBC: 0.5 % — ABNORMAL HIGH (ref 0.0–0.2)

## 2022-01-23 LAB — GLUCOSE, CAPILLARY
Glucose-Capillary: 159 mg/dL — ABNORMAL HIGH (ref 70–99)
Glucose-Capillary: 236 mg/dL — ABNORMAL HIGH (ref 70–99)
Glucose-Capillary: 166 mg/dL — ABNORMAL HIGH (ref 70–99)
Glucose-Capillary: 156 mg/dL — ABNORMAL HIGH (ref 70–99)

## 2022-01-23 LAB — BASIC METABOLIC PANEL
Anion gap: 8 (ref 5–15)
BUN: 37 mg/dL — ABNORMAL HIGH (ref 8–23)
CO2: 32 mmol/L (ref 22–32)
Calcium: 8.5 mg/dL — ABNORMAL LOW (ref 8.9–10.3)
Chloride: 100 mmol/L (ref 98–111)
Creatinine, Ser: 1.92 mg/dL — ABNORMAL HIGH (ref 0.44–1.00)
GFR, Estimated: 25 mL/min — ABNORMAL LOW (ref >60)
Glucose, Bld: 139 mg/dL — ABNORMAL HIGH (ref 70–99)
Potassium: 3.8 mmol/L (ref 3.5–5.1)
Sodium: 140 mmol/L (ref 135–145)

## 2022-01-23 LAB — FERRITIN: Ferritin: 77 ng/mL (ref 11–307)

## 2022-01-23 LAB — IRON AND TIBC
Iron: 24 ug/dL — ABNORMAL LOW (ref 28–170)
Saturation Ratios: 6 % — ABNORMAL LOW (ref 10.4–31.8)
TIBC: 392 ug/dL (ref 250–450)
UIBC: 368 ug/dL

## 2022-01-23 MED ORDER — SODIUM CHLORIDE 0.9 % IV SOLN
125.0000 mg | Freq: Every day | INTRAVENOUS | Status: DC
  Administered 2022-01-23 – 2022-01-26 (×2): 125 mg via INTRAVENOUS
  Filled 2022-01-23 (×4): qty 10

## 2022-01-23 MED ORDER — DARBEPOETIN ALFA 60 MCG/0.3ML IJ SOSY
60.0000 ug | PREFILLED_SYRINGE | INTRAMUSCULAR | Status: DC
  Administered 2022-01-23: 22:00:00 60 ug via SUBCUTANEOUS
  Filled 2022-01-23: qty 0.3

## 2022-01-23 MED ORDER — METOLAZONE 5 MG PO TABS
5.0000 mg | ORAL_TABLET | Freq: Every day | ORAL | Status: DC
  Administered 2022-01-24: 10:00:00 5 mg via ORAL
  Filled 2022-01-23: qty 1

## 2022-01-23 MED ORDER — INSULIN ASPART 100 UNIT/ML IJ SOLN
10.0000 [IU] | Freq: Three times a day (TID) | INTRAMUSCULAR | Status: DC
  Administered 2022-01-23 – 2022-01-26 (×8): 10 [IU] via SUBCUTANEOUS

## 2022-01-23 MED ORDER — TORSEMIDE 100 MG PO TABS
100.0000 mg | ORAL_TABLET | Freq: Two times a day (BID) | ORAL | Status: DC
  Administered 2022-01-23 – 2022-01-24 (×2): 100 mg via ORAL
  Filled 2022-01-23 (×2): qty 1

## 2022-01-23 MED ORDER — POTASSIUM CHLORIDE CRYS ER 20 MEQ PO TBCR
20.0000 meq | EXTENDED_RELEASE_TABLET | Freq: Once | ORAL | Status: AC
  Administered 2022-01-23: 08:00:00 20 meq via ORAL
  Filled 2022-01-23: qty 1

## 2022-01-23 NOTE — TOC Progression Note (Signed)
Transition of Care [facility]) - Progression Note    Patient Details  Name: Katelyn Mahoney MRN: [id number] Date of Birth: 02-21-1934  Transition of Care [facility]) CM/SW Contact  Reece Ag[place], [place] Phone Number: 01/23/2022, 3:15 PM  Clinical Narrative:    CSW faxed pt out to [employer], [place], and [employer] per request. CSW contacted [facility] who spoke to Nature conservation officer and confirmed that they do not take [employer] although pt has an approval letter from their insurance [facility] will still not take pt due to insurance coverage.   Expected Discharge Plan: [place] Barriers to Discharge: Continued Medical Work up  Expected Discharge Plan and Services Expected Discharge Plan: [place] In-house Referral: Clinical Social Work   Post Acute Care Choice: [place] Living arrangements for the past 2 months: [place]                   DME Agency: NA       HH Arranged: Therapist, sports, PT, Nurse's Aide, Social Work [employer] Agency: [place] Date [place]: 01/17/22 Time [place]: [place] Representative spoke with at [place]: [place] Determinants of Health ([place]) Interventions    Readmission Risk Interventions No flowsheet data found.

## 2022-01-23 NOTE — Progress Notes (Signed)
[facility] Progress Note   Subjective:    I/Os yesterday 966 / 1250 on lasix 120 q 6 hours plus metolazone 2.5.  echo is shockingly good -  had a good night-  says that she wished to be discharged to a SNF for rehab   Objective Vitals:   01/23/22 0500 01/23/22 0527 01/23/22 0823 01/23/22 0959  BP:  (!) 123/59 121/65 (!) 136/57  Pulse:  96 (!) 103 (!) 50  Resp:  20 (!) 24 (!) 28  Temp:  97.8 F (36.6 C) 97.7 F (36.5 C)   TempSrc:  Oral Oral   SpO2:  99% 97% 98%  Weight: 129.8 kg     Height:       PHYSICAL EXAM: Gen: in chair looking tired Eyes: anicteric ENT: MMM Neck: supple, ^ JVD at 45 degrees CV:  Reg S1, S2, irreg with PVCs on monitor, no rub Abd:  soft obese Lungs: dec BS bases, ^ WOB with conversation GU: purewick with yellow urine in can Extr: Unna wraps to LE, 1+ pitting in thighs Neuro: conversant, nonfocal  Additional Objective Labs: Basic Metabolic Panel: Recent Labs  Lab 01/18/22 0327 01/19/22 0708 01/21/22 0146 01/22/22 0427 01/23/22 0347  NA 140   < > 141 142 140  K 3.4*   < > 3.8 3.4* 3.8  CL 102   < > 101 101 100  CO2 31   < > 32 34* 32  GLUCOSE 165*   < > 195* 219* 139*  BUN 35*   < > 36* 39* 37*  CREATININE 2.09*   < > 1.98* 1.96* 1.92*  CALCIUM 8.5*   < > 8.5* 8.5* 8.5*  PHOS 3.4  --  3.2 3.3  --    < > = values in this interval not displayed.   Liver Function Tests: Recent Labs  Lab 01/21/22 0146 01/22/22 0427  ALBUMIN 2.5* 2.5*   No results for input(s): LIPASE, AMYLASE in the last 168 hours. CBC: Recent Labs  Lab 01/18/22 0327 01/19/22 0708 01/20/22 0328 01/22/22 0427 01/23/22 0347  WBC 10.2 11.1* 10.4 10.7* 10.1  HGB 8.7* 9.3* 8.9* 9.2* 8.6*  HCT 29.8* 29.6* 28.8* 29.4* 28.5*  MCV 108.4* 104.2* 104.7* 104.6* 102.5*  PLT 342 360 341 387 373   Blood Culture    Component Value Date/Time   SDES URINE, CLEAN CATCH 12/12/2021 1703   SPECREQUEST NONE 12/12/2021 1703   CULT  12/12/2021 1703    NO  GROWTH Performed at [facility] [address] [place], [place] [address]    REPTSTATUS 12/13/2021 FINAL 12/12/2021 1703    Cardiac Enzymes: No results for input(s): CKTOTAL, CKMB, CKMBINDEX, TROPONINI in the last 168 hours. CBG: Recent Labs  Lab 01/22/22 1149 01/22/22 1544 01/22/22 2042 01/23/22 0529 01/23/22 1102  GLUCAP 233* 178* 157* 159* 236*   Iron Studies:  Recent Labs    01/23/22 0347  IRON 24*  TIBC 392  FERRITIN 77   @[email or web address] @ Studies/Results: ECHOCARDIOGRAM COMPLETE  Result Date: 01/22/2022    ECHOCARDIOGRAM REPORT   Patient Name:   ALLY KNODEL Date of Exam: 01/22/2022 Medical Rec #:  [id number]        Height:       66.0 in Accession #:    [id number]       Weight:       302.7 lb Date of Birth:  March 13, 1934        BSA:          2.385  m Patient Age:    88 years         BP:           119/52 mmHg Patient Gender: F                HR:           100 bpm. Exam Location:  Inpatient Procedure: 2D Echo, Cardiac Doppler and Color Doppler Indications:    CHF  History:        Patient has prior history of Echocardiogram examinations, most                 recent 12/13/2021. Arrythmias:Atrial Fibrillation; Risk                 Factors:Hypertension and Diabetes.  Sonographer:    Glo Herring Referring Phys: [id number] [place]  1. ? left pleural effusion.  2. Left ventricular ejection fraction, by estimation, is 50 to 55%. The left ventricle has low normal function. The left ventricle has no regional wall motion abnormalities. Left ventricular diastolic parameters are indeterminate.  3. Right ventricular systolic function is mildly reduced. The right ventricular size is mildly enlarged.  4. Left atrial size was moderately dilated.  5. Right atrial size was moderately dilated.  6. The mitral valve is abnormal. Moderate mitral valve regurgitation. No evidence of mitral stenosis.  7. Tricuspid valve regurgitation is moderate.  8. The aortic valve is tricuspid. There is  moderate calcification of the aortic valve. There is moderate thickening of the aortic valve. Aortic valve regurgitation is trivial. Mild aortic valve stenosis.  9. The inferior vena cava is dilated in size with >50% respiratory variability, suggesting right atrial pressure of 8 mmHg. FINDINGS  Left Ventricle: Left ventricular ejection fraction, by estimation, is 50 to 55%. The left ventricle has low normal function. The left ventricle has no regional wall motion abnormalities. The left ventricular internal cavity size was normal in size. There is no left ventricular hypertrophy. Left ventricular diastolic parameters are indeterminate. Right Ventricle: The right ventricular size is mildly enlarged. No increase in right ventricular wall thickness. Right ventricular systolic function is mildly reduced. Left Atrium: Left atrial size was moderately dilated. Right Atrium: Right atrial size was moderately dilated. Pericardium: There is no evidence of pericardial effusion. Mitral Valve: The mitral valve is abnormal. There is moderate thickening of the mitral valve leaflet(s). There is moderate calcification of the mitral valve leaflet(s). Moderate mitral valve regurgitation. No evidence of mitral valve stenosis. Tricuspid Valve: The tricuspid valve is normal in structure. Tricuspid valve regurgitation is moderate . No evidence of tricuspid stenosis. Aortic Valve: The aortic valve is tricuspid. There is moderate calcification of the aortic valve. There is moderate thickening of the aortic valve. Aortic valve regurgitation is trivial. Mild aortic stenosis is present. Aortic valve mean gradient measures 7.0 mmHg. Aortic valve peak gradient measures 13.8 mmHg. Aortic valve area, by VTI measures 1.16 cm. Pulmonic Valve: The pulmonic valve was normal in structure. Pulmonic valve regurgitation is not visualized. No evidence of pulmonic stenosis. Aorta: The aortic root is normal in size and structure. Venous: The inferior vena  cava is dilated in size with greater than 50% respiratory variability, suggesting right atrial pressure of 8 mmHg. IAS/Shunts: No atrial level shunt detected by color flow Doppler. Additional Comments: ? left pleural effusion.  LEFT VENTRICLE PLAX 2D LVIDd:         4.80 cm LVIDs:         3.30  cm LV PW:         1.10 cm LV IVS:        1.10 cm LVOT diam:     2.00 cm LV SV:         49 LV SV Index:   21 LVOT Area:     3.14 cm  IVC IVC diam: 2.90 cm LEFT ATRIUM           Index LA diam:      4.70 cm 1.97 cm/m LA Vol (A4C): 74.5 ml 31.24 ml/m  AORTIC VALVE AV Area (Vmax):    1.21 cm AV Area (Vmean):   1.32 cm AV Area (VTI):     1.16 cm AV Vmax:           186.00 cm/s AV Vmean:          123.000 cm/s AV VTI:            0.425 m AV Peak Grad:      13.8 mmHg AV Mean Grad:      7.0 mmHg LVOT Vmax:         71.80 cm/s LVOT Vmean:        51.750 cm/s LVOT VTI:          0.156 m LVOT/AV VTI ratio: 0.37  AORTA Ao Root diam: 3.20 cm Ao Asc diam:  2.90 cm MR Peak grad:    91.4 mmHg    TRICUSPID VALVE MR Mean grad:    53.0 mmHg    TR Peak grad:   41.5 mmHg MR Vmax:         478.00 cm/s  TR Vmax:        322.00 cm/s MR Vmean:        343.0 cm/s MR PISA:         3.08 cm     SHUNTS MR PISA Eff ROA: 20 mm       Systemic VTI:  0.16 m MR PISA Radius:  0.70 cm      Systemic Diam: 2.00 cm Jenkins Rouge MD Electronically signed by Jenkins Rouge MD Signature Date/Time: 01/22/2022/2:45:02 PM    Final    Medications:  furosemide 120 mg (01/23/22 0826)    apixaban  2.5 mg Oral BID   collagenase   Topical Daily   darbepoetin (ARANESP) injection - NON-DIALYSIS  60 mcg Subcutaneous Q Tue-1800   diltiazem  30 mg Oral Q6H   gabapentin  100 mg Oral BID   insulin aspart  0-20 Units Subcutaneous TID WC   insulin aspart  7 Units Subcutaneous TID WC   insulin glargine-yfgn  20 Units Subcutaneous QHS   metolazone  2.5 mg Oral Daily   metoprolol tartrate  25 mg Oral BID   pantoprazole  40 mg Oral BID   sertraline  100 mg Oral Daily   sodium  chloride flush  3 mL Intravenous Q12H    Assessment/Plan **AKI on CKD:  longstanding CKD dating back to at least 2016; follows with [facility].  Baseline Cr 2022 was in the 1.2-1.4 range until sepsis admission in 11/2021. Cr more in the 1.5-1.7 range and now during admission for CHF running more in the 2+ range. Suspect cardiorenal main issue here.  Renal US c/w CKD, UA bland.  BP not frankly low but question if AFib impacting renal perfusion to a degree.  At this point she has obligatory diuresis that needs to happen.  UOP ^d with lasix -  did achieve diuresis with lasix 120  iv QID but barely.   added metolazone. Will hopefully be able to manage without major impact on kidney function -- Cr stable at 2 today.   Will follow daily labs.  Avoid nephrotoxic meds and events as able. In an effort to look toward discharge will try an oral diuretic regimen torsemide 100 bid and metolazone 5 daily to see if can achieve a negative balance then we can continue as OP    **AoC HFpEF: 11/2021 grade 2 DD.  Quite severe symptoms of volume overload.  She needs more aggressive diuresis. Diuretics per above, follow response and add metolazone today as well.  On low na diet, further fluid restrict to 1276mL/day. Assessing with echo -  did not look terrible    **Anemia:  normal in 11/2021; melena and angiodysplatic colon lesions on colonoscopy s/p therapy.  Hb stable in the 9s now.  have added ESA and  iron stores low-  will replete    **DM:  well controlled typically.  Meds per primary.    **A fib:  rate controlled on low dose dilt and metoprolol.  On eliquis.    **H/o HTN: has been lower since A fib, off amlodipine now.  Hypokalemia-  stable to improved on 40 BID repletion  - right now dosing daily-  only 20 given today -  will likely need more      Louis Meckel  01/23/2022, 11:03 AM  [facility] Pager: [phone]

## 2022-01-23 NOTE — Progress Notes (Addendum)
Mobility Specialist Progress Note:   01/23/22 1341  Mobility  Bed Position Chair  Activity Ambulated with assistance in room  Level of Assistance Standby assist, set-up cues, supervision of patient - no hands on  Assistive Device Front wheel walker  Distance Ambulated (ft) 48 ft  Activity Response Tolerated well  $Mobility charge 1 Mobility   Pt received in chair willing to participate in mobility. No complaints of pain. Pt more SOB today limiting ambulation. Pt required 2 seated rest breaks.  Pt left on [facility] with call bell in reach, Nurse notified.   [facility] Public librarian Phone [phone] Secondary Phone [phone]

## 2022-01-23 NOTE — Progress Notes (Addendum)
Physical Therapy Treatment Patient Details Name: Katelyn Mahoney MRN: [id number] DOB: 1934/03/03 Today's Date: 01/23/2022   History of Present Illness Pt is an 88 y.o. female who presented 01/08/22 with SOB and weight gain. Pt admitted for acute on chronic HFpEF exacerbation. Pt also with melenic stools/heme positive, plan for EGD 01/10/22. S/p colonoscopy 1/13. PMH includes DM II, HTN, obesity, CKD, HLD, chronic LE edema, chronic hypoxic respiratory failure on 4L oxygen at home, Afib, HfpEF    PT Comments    Pt making steady progress but continues to lack activity tolerance to return home. Continue to recommend SNF.    Recommendations for follow up therapy are one component of a multi-disciplinary discharge planning process, led by the attending physician.  Recommendations may be updated based on patient status, additional functional criteria and insurance authorization.  Follow Up Recommendations  Skilled nursing-short term rehab (<3 hours/day)     Assistance Recommended at Discharge Intermittent Supervision/Assistance  Patient can return home with the following A little help with walking and/or transfers;A little help with bathing/dressing/bathroom;Assistance with cooking/housework;Assist for transportation;Help with stairs or ramp for entrance   Equipment Recommendations  None recommended by PT    Recommendations for Other Services       Precautions / Restrictions Precautions Precautions: Fall;Other (comment) Precaution Comments: 4L O2 at baseline     Mobility  Bed Mobility               General bed mobility comments: up in chair    Transfers Overall transfer level: Needs assistance Equipment used: Rolling walker (2 wheels) Transfers: Sit to/from Stand Sit to Stand: Min guard           General transfer comment: Incr time to perform with pt rocking for momentum.    Ambulation/Gait Ambulation/Gait assistance: Min guard Gait Distance (Feet): 30  Feet Assistive device: Rolling walker (2 wheels) Gait Pattern/deviations: Step-through pattern, Wide base of support, Decreased stride length Gait velocity: decr Gait velocity interpretation: <1.31 ft/sec, indicative of household ambulator   General Gait Details: Assist for safety.   Stairs             Wheelchair Mobility    Modified Rankin (Stroke Patients Only)       Balance Overall balance assessment: Needs assistance Sitting-balance support: No upper extremity supported, Feet supported Sitting balance-Leahy Scale: Fair     Standing balance support: Reliant on assistive device for balance, Bilateral upper extremity supported Standing balance-Leahy Scale: Poor Standing balance comment: walker and min guard for static standing                            Cognition Arousal/Alertness: Awake/alert Behavior During Therapy: WFL for tasks assessed/performed Overall Cognitive Status: Within Functional Limits for tasks assessed                                          Exercises      General Comments General comments (skin integrity, edema, etc.): VSS on 4L      Pertinent Vitals/Pain Pain Assessment Pain Assessment: No/denies pain    Home Living                          Prior Function            PT Goals (current goals can now be found in  the care plan section) Progress towards PT goals: Progressing toward goals    Frequency    Min 2X/week      PT Plan Current plan remains appropriate;Frequency needs to be updated    Co-evaluation              AM-PAC PT "6 Clicks" Mobility   Outcome Measure  Help needed turning from your back to your side while in a flat bed without using bedrails?: A Little Help needed moving from lying on your back to sitting on the side of a flat bed without using bedrails?: A Little Help needed moving to and from a bed to a chair (including a wheelchair)?: A Little Help needed  standing up from a chair using your arms (e.g., wheelchair or bedside chair)?: A Little Help needed to walk in hospital room?: A Little Help needed climbing 3-5 steps with a railing? : Total 6 Click Score: 16    End of Session Equipment Utilized During Treatment: Oxygen Activity Tolerance: Patient limited by fatigue Patient left: in chair;with call bell/phone within reach Nurse Communication: Mobility status PT Visit Diagnosis: Muscle weakness (generalized) (M62.81);Unsteadiness on feet (R26.81);Other abnormalities of gait and mobility (R26.89);Difficulty in walking, not elsewhere classified (R26.2)     Time: 0768-0881 PT Time Calculation (min) (ACUTE ONLY): 16 min  Charges:  $Gait Training: 8-22 mins                     [place] Pager [phone] Office [place] 01/23/2022, 3:58 PM

## 2022-01-23 NOTE — Progress Notes (Signed)
HD#14 Subjective:  Overnight Events: None  Patient assessed at bedside this AM. Patient states that her breathing and swelling have improved significantly. No other complaints or concerns today.   Objective:  Vital signs in last 24 hours: Vitals:   01/22/22 2338 01/23/22 0236 01/23/22 0500 01/23/22 0527  BP: 114/71 114/64  (!) 123/59  Pulse:    96  Resp:    20  Temp:    97.8 F (36.6 C)  TempSrc:    Oral  SpO2:    99%  Weight:   129.8 kg   Height:       Supplemental O2: Nasal Cannula SpO2: 99 % O2 Flow Rate (L/min): 4 L/min  Physical Exam:  Physical Exam HENT:     Head: Normocephalic.  Eyes:     General:        Right eye: No discharge.        Left eye: No discharge.     Conjunctiva/sclera: Conjunctivae normal.  Cardiovascular:     Rate and Rhythm: Normal rate and regular rhythm.     Heart sounds: Normal heart sounds.     Comments: +1 bilateral LE Pulmonary:     Effort: Pulmonary effort is normal.     Comments: Crackles at bilateral lung bases. Mild expiratory wheezing, unchanged from prior Skin:    General: Skin is warm.  Neurological:     Mental Status: She is alert.  Psychiatric:        Mood and Affect: Mood normal.        Behavior: Behavior normal.    Filed Weights   01/21/22 0424 01/22/22 0012 01/23/22 0500  Weight: (!) 137.5 kg (!) 137.3 kg 129.8 kg     Intake/Output Summary (Last 24 hours) at 01/23/2022 0638 Last data filed at 01/22/2022 2300 Gross per 24 hour  Intake 780 ml  Output 900 ml  Net -120 ml    Net IO Since Admission: -1,422.03 mL [01/23/22 0638]  Pertinent Labs: CBC Latest Ref Rng & Units 01/23/2022 01/22/2022 01/20/2022  WBC 4.0 - 10.5 K/uL 10.1 10.7(H) 10.4  Hemoglobin 12.0 - 15.0 g/dL 8.6(L) 9.2(L) 8.9(L)  Hematocrit 36.0 - 46.0 % 28.5(L) 29.4(L) 28.8(L)  Platelets 150 - 400 K/uL 373 387 341    CMP Latest Ref Rng & Units 01/23/2022 01/22/2022 01/21/2022  Glucose 70 - 99 mg/dL 139(H) 219(H) 195(H)  BUN 8 - 23 mg/dL 37(H)  39(H) 36(H)  Creatinine 0.44 - 1.00 mg/dL 1.92(H) 1.96(H) 1.98(H)  Sodium 135 - 145 mmol/L 140 142 141  Potassium 3.5 - 5.1 mmol/L 3.8 3.4(L) 3.8  Chloride 98 - 111 mmol/L 100 101 101  CO2 22 - 32 mmol/L 32 34(H) 32  Calcium 8.9 - 10.3 mg/dL 8.5(L) 8.5(L) 8.5(L)  Total Protein 6.5 - 8.1 g/dL - - -  Total Bilirubin 0.3 - 1.2 mg/dL - - -  Alkaline Phos 38 - 126 U/L - - -  AST 15 - 41 U/L - - -  ALT 0 - 44 U/L - - -    Imaging: ECHOCARDIOGRAM COMPLETE  Result Date: 01/22/2022    ECHOCARDIOGRAM REPORT   Patient Name:   IVEY NEMBHARD Date of Exam: 01/22/2022 Medical Rec #:  [id number]        Height:       66.0 in Accession #:    [id number]       Weight:       302.7 lb Date of Birth:  Sep 25, 1934        BSA:  2.385 m Patient Age:    88 years         BP:           119/52 mmHg Patient Gender: F                HR:           100 bpm. Exam Location:  Inpatient Procedure: 2D Echo, Cardiac Doppler and Color Doppler Indications:    CHF  History:        Patient has prior history of Echocardiogram examinations, most                 recent 12/13/2021. Arrythmias:Atrial Fibrillation; Risk                 Factors:Hypertension and Diabetes.  Sonographer:    Glo Herring Referring Phys: [id number] [place]  1. ? left pleural effusion.  2. Left ventricular ejection fraction, by estimation, is 50 to 55%. The left ventricle has low normal function. The left ventricle has no regional wall motion abnormalities. Left ventricular diastolic parameters are indeterminate.  3. Right ventricular systolic function is mildly reduced. The right ventricular size is mildly enlarged.  4. Left atrial size was moderately dilated.  5. Right atrial size was moderately dilated.  6. The mitral valve is abnormal. Moderate mitral valve regurgitation. No evidence of mitral stenosis.  7. Tricuspid valve regurgitation is moderate.  8. The aortic valve is tricuspid. There is moderate calcification of the aortic valve. There is  moderate thickening of the aortic valve. Aortic valve regurgitation is trivial. Mild aortic valve stenosis.  9. The inferior vena cava is dilated in size with >50% respiratory variability, suggesting right atrial pressure of 8 mmHg. FINDINGS  Left Ventricle: Left ventricular ejection fraction, by estimation, is 50 to 55%. The left ventricle has low normal function. The left ventricle has no regional wall motion abnormalities. The left ventricular internal cavity size was normal in size. There is no left ventricular hypertrophy. Left ventricular diastolic parameters are indeterminate. Right Ventricle: The right ventricular size is mildly enlarged. No increase in right ventricular wall thickness. Right ventricular systolic function is mildly reduced. Left Atrium: Left atrial size was moderately dilated. Right Atrium: Right atrial size was moderately dilated. Pericardium: There is no evidence of pericardial effusion. Mitral Valve: The mitral valve is abnormal. There is moderate thickening of the mitral valve leaflet(s). There is moderate calcification of the mitral valve leaflet(s). Moderate mitral valve regurgitation. No evidence of mitral valve stenosis. Tricuspid Valve: The tricuspid valve is normal in structure. Tricuspid valve regurgitation is moderate . No evidence of tricuspid stenosis. Aortic Valve: The aortic valve is tricuspid. There is moderate calcification of the aortic valve. There is moderate thickening of the aortic valve. Aortic valve regurgitation is trivial. Mild aortic stenosis is present. Aortic valve mean gradient measures 7.0 mmHg. Aortic valve peak gradient measures 13.8 mmHg. Aortic valve area, by VTI measures 1.16 cm. Pulmonic Valve: The pulmonic valve was normal in structure. Pulmonic valve regurgitation is not visualized. No evidence of pulmonic stenosis. Aorta: The aortic root is normal in size and structure. Venous: The inferior vena cava is dilated in size with greater than 50%  respiratory variability, suggesting right atrial pressure of 8 mmHg. IAS/Shunts: No atrial level shunt detected by color flow Doppler. Additional Comments: ? left pleural effusion.  LEFT VENTRICLE PLAX 2D LVIDd:         4.80 cm LVIDs:  3.30 cm LV PW:         1.10 cm LV IVS:        1.10 cm LVOT diam:     2.00 cm LV SV:         49 LV SV Index:   21 LVOT Area:     3.14 cm  IVC IVC diam: 2.90 cm LEFT ATRIUM           Index LA diam:      4.70 cm 1.97 cm/m LA Vol (A4C): 74.5 ml 31.24 ml/m  AORTIC VALVE AV Area (Vmax):    1.21 cm AV Area (Vmean):   1.32 cm AV Area (VTI):     1.16 cm AV Vmax:           186.00 cm/s AV Vmean:          123.000 cm/s AV VTI:            0.425 m AV Peak Grad:      13.8 mmHg AV Mean Grad:      7.0 mmHg LVOT Vmax:         71.80 cm/s LVOT Vmean:        51.750 cm/s LVOT VTI:          0.156 m LVOT/AV VTI ratio: 0.37  AORTA Ao Root diam: 3.20 cm Ao Asc diam:  2.90 cm MR Peak grad:    91.4 mmHg    TRICUSPID VALVE MR Mean grad:    53.0 mmHg    TR Peak grad:   41.5 mmHg MR Vmax:         478.00 cm/s  TR Vmax:        322.00 cm/s MR Vmean:        343.0 cm/s MR PISA:         3.08 cm     SHUNTS MR PISA Eff ROA: 20 mm       Systemic VTI:  0.16 m MR PISA Radius:  0.70 cm      Systemic Diam: 2.00 cm Jenkins Rouge MD Electronically signed by Jenkins Rouge MD Signature Date/Time: 01/22/2022/2:45:02 PM    Final     Assessment/Plan:   Principal Problem:   Acute exacerbation of CHF (congestive heart failure) ([place]) Active Problems:   AKI (acute kidney injury) ([place])   Gastrointestinal hemorrhage   Persistent atrial fibrillation ([place])   Patient Summary: Melea Prezioso is a 88 y.o. with a pertinent PMH of Afib on Eliquis, HFpEF, CKD stage 3b, chronic respiratory failure on 4 L, obesity who was hospitalized for acute HFpEF exacerbation and acute on chronic anemia.   #Acute on chronic HFpEF exacerbation #Chronic respiratory failure, on 3-4 L at home She has 900 cc output + 1 unmeasured void  since yesterday, weight is trending down to 129.8 kg from 137.3 kg yesterday.  Will have RN reweigh the patient. She still volume overloaded on exam though overall much improved since she was first admitted.  Creatinine is slowly downtrending, now at 1.92. Nephrology is following and will now switch to oral diuretics today. In terms of dispo planning, patient originally requesting SNF facility that did not have bed offers at the time and was not covered by her insurance.  Patient had paperwork at bedside showing that her insurance would approve [facility] facility. We have asked our case manager to see the patient to explore this further.  Otherwise, the plan for now is discharge with home health when medically stable. - Appreciate nephrology recommendations -  Will follow-up with Case Manager later today - Start torsemide 100 mg p.o. twice daily - Continue metolazone 5 mg daily - Strict I&O's and daily weights - Trend BMP  - Unna boots    #AKI on CKD3b Suspect cardiorenal. Renal ultrasound was negative for hydronephrosis. Creatinine is trending down from 1.96 to 1.92 (baseline ~1.5-1.6 three weeks ago).  UA showed leukocytes and rare bacteria but was otherwise unremarkable. - Continue metolazone, torsemide - Pending microalbumin/cr ratio - Trend renal function - Strict I&O's as above - Avoid nephrotoxic agents    #Acute on chronic macrocytic anemia #Nonbleeding AVM EGD showed a single nonbleeding angiodysplastic lesion in the duodenum that was treated with monopolar probe. Colonoscopy showed 3 nonbleeding AVMs that was cauterized.  Patient is hemodynamically stable with stable hemoglobin values.  Iron stores are low, will give for Ferrlecit per nephro today. - IV iron today - Continue Eliquis - Protonix 40mg  po bid    #Atrial fibrillation Currently rate-controlled. - Diltiazem 30mg  q6h  - Metoprolol 25mg  bid  - Continue Eliquis - Cardiac monitoring    #Diabetes mellitus with peripheral  neuropathy  Patient is on 34U basal insulin and 6u tid meal time coverage at home. Most recent HbA1c of 7.4.  Fasting CBG of 159. - Continue Semglee to 20U nightly - Continue NovoLog 7 units 3 times daily with meals - SSI with meals - Continue home gabapentin    Diet: Normal IVF: None,None VTE: NOAC Code: DNR PT/OT recs: SNF for Subacute PT   Dispo: Anticipated discharge to Home pending diuresis, dispo planning SNF vs HH  Orvis Brill, MD 01/23/2022, 6:38 AM Pager: [phone] Please contact the on call pager after 5 pm and on weekends at [phone].

## 2022-01-23 NOTE — Plan of Care (Signed)
  Problem: Education: Goal: Knowledge of General Education information will improve Description Including pain rating scale, medication(s)/side effects and non-pharmacologic comfort measures Outcome: Progressing   Problem: Health Behavior/Discharge Planning: Goal: Ability to manage health-related needs will improve Outcome: Progressing   

## 2022-01-23 NOTE — TOC Progression Note (Signed)
Transition of Care [facility]) - Progression Note    Patient Details  Name: Annissa Andreoni MRN: [id number] Date of Birth: 1934-10-30  [facility] CM/SW Contact  Zenon Mayo, RN Phone Number: 01/23/2022, 2:30 PM  Clinical Narrative:    NCM spoke with patient in the room she states she has a form that has approved [facility] SNF for her, NCM made copy of form and gave to CSW to follow up on.  Informed patient we will look into this for her.   Expected Discharge Plan: [place] Barriers to Discharge: Continued Medical Work up  Expected Discharge Plan and Services Expected Discharge Plan: [place] In-house Referral: Clinical Social Work   Post Acute Care Choice: [place] Living arrangements for the past 2 months: [place]                   DME Agency: NA       HH Arranged: Therapist, sports, PT, Nurse's Aide, Social Work [employer] Agency: [place] Date [place]: 01/17/22 Time [place]: [place] Representative spoke with at [place]: [place] Determinants of Health ([place]) Interventions    Readmission Risk Interventions No flowsheet data found.

## 2022-01-23 NOTE — TOC Progression Note (Addendum)
Transition of Care [facility]) - Progression Note    Patient Details  Name: Katelyn Mahoney MRN: [id number] Date of Birth: 08/30/1934  Transition [facility] Care [facility]) CM/SW Contact  Zenon Mayo, RN Phone Number: 01/23/2022, 3:53 PM  Clinical Narrative:    NCM and supervisor went to talk to patient about SNF, she has decided she will go to [facility].  NCM left vm with Lorenza Chick at [facility] that patient is interested to see when a bed will be available. CSW will follow up in the am.   Expected Discharge Plan: [place] Barriers to Discharge: Continued Medical Work up  Expected Discharge Plan and Services Expected Discharge Plan: [place] In-house Referral: Clinical Social Work   Post Acute Care Choice: [place] Living arrangements for the past 2 months: [place]                   DME Agency: NA       HH Arranged: Therapist, sports, PT, Nurse's Aide, Social Work [employer] Agency: [place] Date [place]: 01/17/22 Time [place]: [place] Representative spoke with at [place]: [place] Determinants of Health ([place]) Interventions    Readmission Risk Interventions No flowsheet data found.

## 2022-01-24 DIAGNOSIS — D539 Nutritional anemia, unspecified: Secondary | ICD-10-CM | POA: Diagnosis not present

## 2022-01-24 DIAGNOSIS — I5033 Acute on chronic diastolic (congestive) heart failure: Secondary | ICD-10-CM | POA: Diagnosis not present

## 2022-01-24 DIAGNOSIS — I4891 Unspecified atrial fibrillation: Secondary | ICD-10-CM | POA: Diagnosis not present

## 2022-01-24 DIAGNOSIS — J9610 Chronic respiratory failure, unspecified whether with hypoxia or hypercapnia: Secondary | ICD-10-CM | POA: Diagnosis not present

## 2022-01-24 LAB — GLUCOSE, CAPILLARY
Glucose-Capillary: 133 mg/dL — ABNORMAL HIGH (ref 70–99)
Glucose-Capillary: 214 mg/dL — ABNORMAL HIGH (ref 70–99)
Glucose-Capillary: 169 mg/dL — ABNORMAL HIGH (ref 70–99)
Glucose-Capillary: 149 mg/dL — ABNORMAL HIGH (ref 70–99)

## 2022-01-24 LAB — BASIC METABOLIC PANEL
Anion gap: 9 (ref 5–15)
BUN: 39 mg/dL — ABNORMAL HIGH (ref 8–23)
CO2: 35 mmol/L — ABNORMAL HIGH (ref 22–32)
Calcium: 8.8 mg/dL — ABNORMAL LOW (ref 8.9–10.3)
Chloride: 94 mmol/L — ABNORMAL LOW (ref 98–111)
Creatinine, Ser: 2.06 mg/dL — ABNORMAL HIGH (ref 0.44–1.00)
GFR, Estimated: 23 mL/min — ABNORMAL LOW (ref >60)
Glucose, Bld: 136 mg/dL — ABNORMAL HIGH (ref 70–99)
Potassium: 3 mmol/L — ABNORMAL LOW (ref 3.5–5.1)
Sodium: 138 mmol/L (ref 135–145)

## 2022-01-24 LAB — CBC
HCT: 27.9 % — ABNORMAL LOW (ref 36.0–46.0)
Hemoglobin: 8.8 g/dL — ABNORMAL LOW (ref 12.0–15.0)
MCH: 32.2 pg (ref 26.0–34.0)
MCHC: 31.5 g/dL (ref 30.0–36.0)
MCV: 102.2 fL — ABNORMAL HIGH (ref 80.0–100.0)
Platelets: 372 10*3/uL (ref 150–400)
RBC: 2.73 MIL/uL — ABNORMAL LOW (ref 3.87–5.11)
RDW: 15.4 % (ref 11.5–15.5)
WBC: 10.6 10*3/uL — ABNORMAL HIGH (ref 4.0–10.5)
nRBC: 0.5 % — ABNORMAL HIGH (ref 0.0–0.2)

## 2022-01-24 LAB — RESP PANEL BY RT-PCR (FLU A&B, COVID) ARPGX2
Influenza A by PCR: NEGATIVE
Influenza B by PCR: NEGATIVE
SARS Coronavirus 2 by RT PCR: NEGATIVE

## 2022-01-24 MED ORDER — POTASSIUM CHLORIDE CRYS ER 20 MEQ PO TBCR
40.0000 meq | EXTENDED_RELEASE_TABLET | Freq: Two times a day (BID) | ORAL | Status: DC
  Administered 2022-01-24 – 2022-01-25 (×3): 40 meq via ORAL
  Filled 2022-01-24 (×3): qty 2

## 2022-01-24 NOTE — Plan of Care (Signed)
°  Problem: Skin Integrity: Goal: Risk for impaired skin integrity will decrease Outcome: Progressing   Problem: Safety: Goal: Ability to remain free from injury will improve Outcome: Progressing   Problem: Education: Goal: Ability to demonstrate management of disease process will improve Outcome: Progressing   Problem: Activity: Goal: Capacity to carry out activities will improve Outcome: Progressing   Problem: Cardiac: Goal: Ability to achieve and maintain adequate cardiopulmonary perfusion will improve Outcome: Progressing

## 2022-01-24 NOTE — TOC Progression Note (Addendum)
Transition of Care [facility]) - Progression Note    Patient Details  Name: Katelyn Mahoney MRN: [id number] Date of Birth: 1934/10/07  Transition of Care [facility]) CM/SW Contact  Reece Agar, [place] Phone Number: 01/24/2022, 1:29 PM  Clinical Narrative:    CSW spoke with [facility] about an [facility] bed [facility] pt. [facility] but wants CSW to let pt know there is no private rooms at the moment. CSW spoke with pt about [facility] not having any private rooms, pt was okay with having to share a room but would eventually like to be moved to a private room.   Pt DC pending auth, pt is not navi managed but the facility states they do not do [place] for [employer]. CSW followed back up with facility after trying auth again to confirm pt is not navi managed.    Expected Discharge Plan: [place] Barriers to Discharge: Continued Medical Work up  Expected Discharge Plan and Services Expected Discharge Plan: [place] In-house Referral: Clinical Social Work   Post Acute Care Choice: [place] Living arrangements for the past 2 months: [place]                   DME Agency: NA       HH Arranged: Therapist, sports, PT, Nurse's Aide, Social Work [employer] Agency: [place] Date [place]: 01/17/22 Time [place]: [place] Representative spoke with at [place]: [place] Determinants of Health ([place]) Interventions    Readmission Risk Interventions No flowsheet data found.

## 2022-01-24 NOTE — Consult Note (Signed)
[facility] Nurse wound follow up Wound type:reconsult for coccyx wound. Is located at the apex of gluteal cleft.  Due to body habitus, this area is frequently moist in skin fold.  Wound is 100% thin fibrin.  USe of santyl enzymatic debrider has not been effective.   Will switch topical therapy.Albumin is 2.5.  Patient is diuresing, but remains edematous and skin is frequently moist.   Measurement: 1 cm x 0.6 cm fibrin to wound bed Wound bed: 100% thin fibrin Drainage (amount, consistency, odor) none noted Periwound: skin is frequently moist due to body habitus and wound is located in a skin fold.  Dressing procedure/placement/frequency: Cleanse coccyx wound with NS and pat dry.  Apply alginate to wound bed ([facility] # [id number]) Cover with foam dressing  change daily  Will not follow at this time.  Please re-consult if needed.  Domenic Moras MSN, RN, FNP-BC CWON Wound, Ostomy, Continence Nurse Pager [phone]

## 2022-01-24 NOTE — Progress Notes (Signed)
Patients BP has been been running low, especially her diastolic . This has been making patient anxious and worried about taking her medications, specifically metoprolol and diltiazem. Please discuss medications with her, she would like to discuss with a physician before she is discharged to he facility tomorrow.

## 2022-01-24 NOTE — Progress Notes (Signed)
[facility] Progress Note   Subjective:    I/Os yesterday 1600 / 2100 on torsemide 100 bid  plus metolazone 5.  -  sore butt-  c/o fluid restriction-  says that she wished to be discharged to a SNF for rehab   Objective Vitals:   01/24/22 0005 01/24/22 0025 01/24/22 0519 01/24/22 0755  BP: (!) 86/40 (!) 109/45 109/76 (!) 95/45  Pulse:   96 (!) 126  Resp:   20 19  Temp:   97.6 F (36.4 C)   TempSrc:   Oral   SpO2:   90% 95%  Weight:   135.7 kg   Height:       PHYSICAL EXAM: Gen: in chair looking tired Eyes: anicteric ENT: MMM Neck: supple, ^ JVD at 45 degrees CV:  Reg S1, S2, irreg with PVCs on monitor, no rub Abd:  soft obese Lungs: dec BS bases, ^ WOB with conversation GU: purewick with yellow urine in can Extr: Unna wraps to LE, 1+ pitting in thighs Neuro: conversant, nonfocal  Additional Objective Labs: Basic Metabolic Panel: Recent Labs  Lab 01/18/22 0327 01/19/22 0708 01/21/22 0146 01/22/22 0427 01/23/22 0347 01/24/22 0447  NA 140   < > 141 142 140 138  K 3.4*   < > 3.8 3.4* 3.8 3.0*  CL 102   < > 101 101 100 94*  CO2 31   < > 32 34* 32 35*  GLUCOSE 165*   < > 195* 219* 139* 136*  BUN 35*   < > 36* 39* 37* 39*  CREATININE 2.09*   < > 1.98* 1.96* 1.92* 2.06*  CALCIUM 8.5*   < > 8.5* 8.5* 8.5* 8.8*  PHOS 3.4  --  3.2 3.3  --   --    < > = values in this interval not displayed.   Liver Function Tests: Recent Labs  Lab 01/21/22 0146 01/22/22 0427  ALBUMIN 2.5* 2.5*   No results for input(s): LIPASE, AMYLASE in the last 168 hours. CBC: Recent Labs  Lab 01/19/22 0708 01/20/22 0328 01/22/22 0427 01/23/22 0347 01/24/22 0447  WBC 11.1* 10.4 10.7* 10.1 10.6*  HGB 9.3* 8.9* 9.2* 8.6* 8.8*  HCT 29.6* 28.8* 29.4* 28.5* 27.9*  MCV 104.2* 104.7* 104.6* 102.5* 102.2*  PLT 360 341 387 373 372   Blood Culture    Component Value Date/Time   SDES URINE, CLEAN CATCH 12/12/2021 1703   SPECREQUEST NONE 12/12/2021 1703   CULT  12/12/2021  1703    NO GROWTH Performed at [facility] [address], [place], [place] [address]    REPTSTATUS 12/13/2021 FINAL 12/12/2021 1703    Cardiac Enzymes: No results for input(s): CKTOTAL, CKMB, CKMBINDEX, TROPONINI in the last 168 hours. CBG: Recent Labs  Lab 01/23/22 0529 01/23/22 1102 01/23/22 1612 01/23/22 2057 01/24/22 0603  GLUCAP 159* 236* 166* 156* 133*   Iron Studies:  Recent Labs    01/23/22 0347  IRON 24*  TIBC 392  FERRITIN 77   @lablastinr3 @ Studies/Results: ECHOCARDIOGRAM COMPLETE  Result Date: 01/22/2022    ECHOCARDIOGRAM REPORT   Patient Name:   Katelyn Mahoney Date of Exam: 01/22/2022 Medical Rec #:  [id number]        Height:       66.0 in Accession #:    [id number]       Weight:       302.7 lb Date of Birth:  [birth year]        BSA:  2.385 m Patient Age:    88 years         BP:           119/52 mmHg Patient Gender: F                HR:           100 bpm. Exam Location:  Inpatient Procedure: 2D Echo, Cardiac Doppler and Color Doppler Indications:    CHF  History:        Patient has prior history of Echocardiogram examinations, most                 recent 12/13/2021. Arrythmias:Atrial Fibrillation; Risk                 Factors:Hypertension and Diabetes.  Sonographer:    Glo Herring Referring Phys: [id number] [place]  1. ? left pleural effusion.  2. Left ventricular ejection fraction, by estimation, is 50 to 55%. The left ventricle has low normal function. The left ventricle has no regional wall motion abnormalities. Left ventricular diastolic parameters are indeterminate.  3. Right ventricular systolic function is mildly reduced. The right ventricular size is mildly enlarged.  4. Left atrial size was moderately dilated.  5. Right atrial size was moderately dilated.  6. The mitral valve is abnormal. Moderate mitral valve regurgitation. No evidence of mitral stenosis.  7. Tricuspid valve regurgitation is moderate.  8. The aortic valve is  tricuspid. There is moderate calcification of the aortic valve. There is moderate thickening of the aortic valve. Aortic valve regurgitation is trivial. Mild aortic valve stenosis.  9. The inferior vena cava is dilated in size with >50% respiratory variability, suggesting right atrial pressure of 8 mmHg. FINDINGS  Left Ventricle: Left ventricular ejection fraction, by estimation, is 50 to 55%. The left ventricle has low normal function. The left ventricle has no regional wall motion abnormalities. The left ventricular internal cavity size was normal in size. There is no left ventricular hypertrophy. Left ventricular diastolic parameters are indeterminate. Right Ventricle: The right ventricular size is mildly enlarged. No increase in right ventricular wall thickness. Right ventricular systolic function is mildly reduced. Left Atrium: Left atrial size was moderately dilated. Right Atrium: Right atrial size was moderately dilated. Pericardium: There is no evidence of pericardial effusion. Mitral Valve: The mitral valve is abnormal. There is moderate thickening of the mitral valve leaflet(s). There is moderate calcification of the mitral valve leaflet(s). Moderate mitral valve regurgitation. No evidence of mitral valve stenosis. Tricuspid Valve: The tricuspid valve is normal in structure. Tricuspid valve regurgitation is moderate . No evidence of tricuspid stenosis. Aortic Valve: The aortic valve is tricuspid. There is moderate calcification of the aortic valve. There is moderate thickening of the aortic valve. Aortic valve regurgitation is trivial. Mild aortic stenosis is present. Aortic valve mean gradient measures 7.0 mmHg. Aortic valve peak gradient measures 13.8 mmHg. Aortic valve area, by VTI measures 1.16 cm. Pulmonic Valve: The pulmonic valve was normal in structure. Pulmonic valve regurgitation is not visualized. No evidence of pulmonic stenosis. Aorta: The aortic root is normal in size and structure. Venous:  The inferior vena cava is dilated in size with greater than 50% respiratory variability, suggesting right atrial pressure of 8 mmHg. IAS/Shunts: No atrial level shunt detected by color flow Doppler. Additional Comments: ? left pleural effusion.  LEFT VENTRICLE PLAX 2D LVIDd:         4.80 cm LVIDs:  3.30 cm LV PW:         1.10 cm LV IVS:        1.10 cm LVOT diam:     2.00 cm LV SV:         49 LV SV Index:   21 LVOT Area:     3.14 cm  IVC IVC diam: 2.90 cm LEFT ATRIUM           Index LA diam:      4.70 cm 1.97 cm/m LA Vol (A4C): 74.5 ml 31.24 ml/m  AORTIC VALVE AV Area (Vmax):    1.21 cm AV Area (Vmean):   1.32 cm AV Area (VTI):     1.16 cm AV Vmax:           186.00 cm/s AV Vmean:          123.000 cm/s AV VTI:            0.425 m AV Peak Grad:      13.8 mmHg AV Mean Grad:      7.0 mmHg LVOT Vmax:         71.80 cm/s LVOT Vmean:        51.750 cm/s LVOT VTI:          0.156 m LVOT/AV VTI ratio: 0.37  AORTA Ao Root diam: 3.20 cm Ao Asc diam:  2.90 cm MR Peak grad:    91.4 mmHg    TRICUSPID VALVE MR Mean grad:    53.0 mmHg    TR Peak grad:   41.5 mmHg MR Vmax:         478.00 cm/s  TR Vmax:        322.00 cm/s MR Vmean:        343.0 cm/s MR PISA:         3.08 cm     SHUNTS MR PISA Eff ROA: 20 mm       Systemic VTI:  0.16 m MR PISA Radius:  0.70 cm      Systemic Diam: 2.00 cm Jenkins Rouge MD Electronically signed by Jenkins Rouge MD Signature Date/Time: 01/22/2022/2:45:02 PM    Final    Medications:  ferric gluconate (FERRLECIT) IVPB Stopped (01/23/22 1411)    apixaban  2.5 mg Oral BID   collagenase   Topical Daily   darbepoetin (ARANESP) injection - NON-DIALYSIS  60 mcg Subcutaneous Q Tue-1800   diltiazem  30 mg Oral Q6H   gabapentin  100 mg Oral BID   insulin aspart  0-20 Units Subcutaneous TID WC   insulin aspart  10 Units Subcutaneous TID WC   insulin glargine-yfgn  20 Units Subcutaneous QHS   metolazone  5 mg Oral Daily   metoprolol tartrate  25 mg Oral BID   pantoprazole  40 mg Oral BID    potassium chloride  40 mEq Oral BID   sertraline  100 mg Oral Daily   sodium chloride flush  3 mL Intravenous Q12H   torsemide  100 mg Oral BID    Assessment/Plan **AKI on CKD:  longstanding CKD dating back to at least 2016; follows with [facility].  Baseline Cr 2022 was in the 1.2-1.4 range until sepsis admission in 11/2021. Cr more in the 1.5-1.7 range and now during admission for CHF running more in the 2+ range. Suspect cardiorenal main issue here.  Renal US c/w CKD, UA bland.  BP not frankly low but question if AFib impacting renal perfusion to a degree.  At this point she  has obligatory diuresis that needs to happen.  UOP ^d with now torsemide and metolazone-  did achieve diuresis  but barely.   Will hopefully be able to manage without major impact on kidney function -- Cr stable at 2 today.   Will follow daily labs.  Avoid nephrotoxic meds and events as able. continue torsemide 100 bid and metolazone 5 daily - so far have been able to achieve a negative balance -  we can continue as OP -  would be stable for discharge is dispo is determined    **AoC HFpEF: 11/2021 grade 2 DD.  Quite severe symptoms of volume overload.  She needs more aggressive diuresis. Diuretics per above, follow response and add metolazone today as well.  On low na diet, further fluid restrict to 1235mL/day. Assessing with echo -  did not look terrible    **Anemia:  normal in 11/2021; melena and angiodysplatic colon lesions on colonoscopy s/p therapy.  Hb stable in the 9s now.  have added ESA and  iron stores low-  repleting    **DM:  well controlled typically.  Meds per primary.    **A fib:  rate controlled on low dose dilt and metoprolol.  On eliquis.    **H/o HTN: has been lower since A fib, off amlodipine now.  Hypokalemia-   40 BID repletion  -  suspect will need to continue long term with the big dose diuretics she is on     Louis Meckel  01/24/2022, 9:02 AM  [facility] Pager: [phone]

## 2022-01-24 NOTE — Progress Notes (Signed)
BP was running a little low. 86/40 , then 109/45. She has diltiazem due and wants to hold it. Provider notified.

## 2022-01-24 NOTE — Progress Notes (Signed)
Patient iv infiltrated and pt. Refuse another iv MD aware. Will continue to monitor.

## 2022-01-24 NOTE — Progress Notes (Signed)
HD#15 Subjective:  Overnight Events: None  Patient assessed at bedside this AM. Patient states tthat she is worried about her low blood pressure, but feels well otherwise. We rechecked BP it was 103/48. She endorses high urine output overnight.  No other complaints or concerns today.   Patient also reports a possible wound on the backside that has been bothering her.  Objective:  Vital signs in last 24 hours: Vitals:   01/23/22 2212 01/24/22 0005 01/24/22 0025 01/24/22 0519  BP: (!) 117/39 (!) 86/40 (!) 109/45 109/76  Pulse: 86   96  Resp:    20  Temp:    97.6 F (36.4 C)  TempSrc:    Oral  SpO2:    90%  Weight:    135.7 kg  Height:       Supplemental O2: Nasal Cannula SpO2: 90 % O2 Flow Rate (L/min): 4 L/min  Physical Exam:  Physical Exam HENT:     Head: Normocephalic.  Eyes:     General:        Right eye: No discharge.        Left eye: No discharge.     Conjunctiva/sclera: Conjunctivae normal.  Cardiovascular:     Rate and Rhythm: Normal rate and regular rhythm.     Heart sounds: Normal heart sounds.     Comments: +1 bilateral LE Pulmonary:     Effort: Pulmonary effort is normal.     Comments: Crackles at bilateral lung bases.  Skin:    General: Skin is warm.  Neurological:     Mental Status: She is alert.  Psychiatric:        Mood and Affect: Mood normal.        Behavior: Behavior normal.    Filed Weights   01/22/22 0012 01/23/22 0500 01/24/22 0519  Weight: (!) 137.3 kg 129.8 kg 135.7 kg     Intake/Output Summary (Last 24 hours) at 01/24/2022 0619 Last data filed at 01/24/2022 0526 Gross per 24 hour  Intake 1602 ml  Output 2100 ml  Net -498 ml    Net IO Since Admission: -2,083.95 mL [01/24/22 0619]  Pertinent Labs: CBC Latest Ref Rng & Units 01/24/2022 01/23/2022 01/22/2022  WBC 4.0 - 10.5 K/uL 10.6(H) 10.1 10.7(H)  Hemoglobin 12.0 - 15.0 g/dL 8.8(L) 8.6(L) 9.2(L)  Hematocrit 36.0 - 46.0 % 27.9(L) 28.5(L) 29.4(L)  Platelets 150 - 400 K/uL  372 373 387    CMP Latest Ref Rng & Units 01/24/2022 01/23/2022 01/22/2022  Glucose 70 - 99 mg/dL 136(H) 139(H) 219(H)  BUN 8 - 23 mg/dL 39(H) 37(H) 39(H)  Creatinine 0.44 - 1.00 mg/dL 2.06(H) 1.92(H) 1.96(H)  Sodium 135 - 145 mmol/L 138 140 142  Potassium 3.5 - 5.1 mmol/L 3.0(L) 3.8 3.4(L)  Chloride 98 - 111 mmol/L 94(L) 100 101  CO2 22 - 32 mmol/L 35(H) 32 34(H)  Calcium 8.9 - 10.3 mg/dL 8.8(L) 8.5(L) 8.5(L)  Total Protein 6.5 - 8.1 g/dL - - -  Total Bilirubin 0.3 - 1.2 mg/dL - - -  Alkaline Phos 38 - 126 U/L - - -  AST 15 - 41 U/L - - -  ALT 0 - 44 U/L - - -    Imaging: No results found.  Assessment/Plan:   Principal Problem:   Acute exacerbation of CHF (congestive heart failure) (HCC) Active Problems:   AKI (acute kidney injury) ([place])   Gastrointestinal hemorrhage   Persistent atrial fibrillation ([place])   Patient Summary: Katelyn Mahoney is a 88 y.o. with  a pertinent PMH of Afib on Eliquis, HFpEF, CKD stage 3b, chronic respiratory failure on 4 L, obesity who was hospitalized for acute HFpEF exacerbation and acute on chronic anemia.   #Acute on chronic HFpEF exacerbation #Chronic respiratory failure, on 3-4 L at home She had 2100 cc of output last night.  Her weight changed from 286 pound to 299.  This may reflect her true weight, down from 302 pounds 2 days ago.  We were concerned about low blood pressure and possible overdiuresis.  Have discussed with nephrology and decided to continue torsemide and metolazone.  We will monitor her blood pressure closely today. - Appreciate nephrology recommendations - Continue torsemide 100 mg p.o. BID and Metolazone - Replete potassium - Strict I&O's and daily weights - Trend BMP  - Unna boots    #AKI on CKD3b Suspect cardiorenal. Renal ultrasound UA unremarkable.  Creatinine is relatively stable from the day prior.  Suspect this is her new baseline creatinine. - Continue metolazone, torsemide - Trend renal function - Strict I&O's  as above - Avoid nephrotoxic agents    #Acute on chronic macrocytic anemia #Nonbleeding AVM EGD showed a single nonbleeding angiodysplastic lesion in the duodenum that was treated with monopolar probe. Colonoscopy showed 3 nonbleeding AVMs that was cauterized.  Patient is hemodynamically stable with stable hemoglobin values.  Iron stores are low, will give for Ferrlecit per nephro today. - IV iron today - Continue Eliquis - Protonix 40mg  po bid    #Atrial fibrillation Currently rate-controlled. - Diltiazem 30mg  q6h  - Metoprolol 25mg  bid  - Continue Eliquis - Cardiac monitoring    #Diabetes mellitus with peripheral neuropathy  Patient is on 34U basal insulin and 6u tid meal time coverage at home. Most recent HbA1c of 7.4.  Fasting CBG of 133. - Continue Semglee to 20U nightly - Continue NovoLog 7 units 3 times daily with meals - SSI with meals - Continue home gabapentin    #Possible sacral ulcer - Consult wound care and will evaluate her wound  Diet: HH Carb IVF: None,None VTE: NOAC Code: DNR PT/OT recs: SNF for Subacute PT TOC: Pending approval from [place]: Anticipated discharge to Home pending diuresis, dispo planning SNF vs [place], DO Internal Medicine Residency My pager: [phone]   Please contact the on call pager after 5 pm and on weekends at [phone].

## 2022-01-24 NOTE — Progress Notes (Signed)
Occupational Therapy Treatment Patient Details Name: Katelyn Mahoney MRN: [id number] DOB: April 09, 1934 Today's Date: 01/24/2022   History of present illness Pt is an 88 y.o. female who presented 01/08/22 with SOB and weight gain. Pt admitted for acute on chronic HFpEF exacerbation. Pt also with melenic stools/heme positive, plan for EGD 01/10/22. S/p colonoscopy 1/13. PMH includes DM II, HTN, obesity, CKD, HLD, chronic LE edema, chronic hypoxic respiratory failure on 4L oxygen at home, Afib, HfpEF   OT comments  Patient making incremental progress towards goals in skilled OT session. Patient's session encompassed functional mobility and ADLs. Patient more flat and fatigued in session, but participatory. Patient with decreased activity tolerance in standing, and unable to take standing blood pressure with significant drop noted. Patient able to complete seated exercises with therapist. Discharge to SNF remains appropriate.   Blood pressure seated: 125/54 Blood pressure standing (unable to stand for duration): 91/79 Blood pressure seated after 3 minutes: 112/72   Recommendations for follow up therapy are one component of a multi-disciplinary discharge planning process, led by the attending physician.  Recommendations may be updated based on patient status, additional functional criteria and insurance authorization.    Follow Up Recommendations  Skilled nursing-short term rehab (<3 hours/day)    Assistance Recommended at Discharge Intermittent Supervision/Assistance  Patient can return home with the following  Help with stairs or ramp for entrance;A little help with bathing/dressing/bathroom;A little help with walking and/or transfers;Assistance with cooking/housework;Direct supervision/assist for financial management;Direct supervision/assist for medications management   Equipment Recommendations  None recommended by OT    Recommendations for Other Services      Precautions / Restrictions  Precautions Precautions: Fall;Other (comment) Precaution Comments: 4L O2 at baseline, watch BP Restrictions Weight Bearing Restrictions: No       Mobility Bed Mobility               General bed mobility comments: up in chair    Transfers Overall transfer level: Needs assistance Equipment used: Rolling walker (2 wheels) Transfers: Sit to/from Stand Sit to Stand: Min guard           General transfer comment: Incr time to perform with pt rocking for momentum.     Balance Overall balance assessment: Needs assistance Sitting-balance support: No upper extremity supported, Feet supported Sitting balance-Leahy Scale: Fair     Standing balance support: Reliant on assistive device for balance, Bilateral upper extremity supported Standing balance-Leahy Scale: Poor Standing balance comment: walker and min guard for static standing                           ADL either performed or assessed with clinical judgement   ADL Overall ADL's : Needs assistance/impaired     Grooming: Wash/dry hands;Wash/dry face;Brushing hair;Oral care;Sitting Grooming Details (indicate cue type and reason): also using shower cap to wash hair             Lower Body Dressing: Total assistance Lower Body Dressing Details (indicate cue type and reason): assist for socks but uses sock aide at home Toilet Transfer: Min Designer, jewellery Details (indicate cue type and reason): simulated in room, using RW         Functional mobility during ADLs: Min guard;Rolling walker (2 wheels) General ADL Comments: Patient with low BP (see note) limiting standing ADLs and functional activity in session    Extremity/Trunk Assessment              Vision  Perception     Praxis      Cognition Arousal/Alertness: Awake/alert Behavior During Therapy: WFL for tasks assessed/performed Overall Cognitive Status: Within Functional Limits for tasks assessed                                           Exercises      Shoulder Instructions       General Comments      Pertinent Vitals/ Pain       Pain Assessment Pain Assessment: No/denies pain  Home Living                                          Prior Functioning/Environment              Frequency  Min 2X/week        Progress Toward Goals  OT Goals(current goals can now be found in the care plan section)  Progress towards OT goals: Progressing toward goals  Acute Rehab OT Goals Patient Stated Goal: get all of this sorted out so i can get out of here OT Goal Formulation: With patient Time For Goal Achievement: 02/01/22 Potential to Achieve Goals: [place] Discharge plan remains appropriate;Frequency remains appropriate    Co-evaluation                 AM-PAC OT "6 Clicks" Daily Activity     Outcome Measure   Help from another person eating meals?: None Help from another person taking care of personal grooming?: A Little Help from another person toileting, which includes using toliet, bedpan, or urinal?: A Little Help from another person bathing (including washing, rinsing, drying)?: A Lot Help from another person to put on and taking off regular upper body clothing?: A Little Help from another person to put on and taking off regular lower body clothing?: A Lot 6 Click Score: 17    End of Session Equipment Utilized During Treatment: Oxygen;Rolling walker (2 wheels);Gait belt  OT Visit Diagnosis: Unsteadiness on feet (R26.81);Muscle weakness (generalized) (M62.81)   Activity Tolerance Patient limited by fatigue;Other (comment) (low BP in standing)   Patient Left in chair;with call bell/phone within reach   Nurse Communication Mobility status;Other (comment) (Low BP in standing)        Time: 1345-1414 OT Time Calculation (min): 29 min  Charges: OT General Charges $OT Visit: 1 Visit OT Treatments $Self Care/Home Management  : 23-37 mins  [place]. Clois Treanor, OTR/L Acute Rehabilitation Services [phone] [place] 01/24/2022, 3:14 PM

## 2022-01-25 ENCOUNTER — Inpatient Hospital Stay (HOSPITAL_COMMUNITY): Payer: Medicare Other

## 2022-01-25 DIAGNOSIS — Q2730 Arteriovenous malformation, site unspecified: Secondary | ICD-10-CM

## 2022-01-25 DIAGNOSIS — S31809A Unspecified open wound of unspecified buttock, initial encounter: Secondary | ICD-10-CM

## 2022-01-25 LAB — RENAL FUNCTION PANEL
Albumin: 2.7 g/dL — ABNORMAL LOW (ref 3.5–5.0)
Anion gap: 9 (ref 5–15)
BUN: 41 mg/dL — ABNORMAL HIGH (ref 8–23)
CO2: 36 mmol/L — ABNORMAL HIGH (ref 22–32)
Calcium: 8.8 mg/dL — ABNORMAL LOW (ref 8.9–10.3)
Chloride: 93 mmol/L — ABNORMAL LOW (ref 98–111)
Creatinine, Ser: 2.15 mg/dL — ABNORMAL HIGH (ref 0.44–1.00)
GFR, Estimated: 22 mL/min — ABNORMAL LOW (ref >60)
Glucose, Bld: 137 mg/dL — ABNORMAL HIGH (ref 70–99)
Phosphorus: 3.5 mg/dL (ref 2.5–4.6)
Potassium: 3.3 mmol/L — ABNORMAL LOW (ref 3.5–5.1)
Sodium: 138 mmol/L (ref 135–145)

## 2022-01-25 LAB — GLUCOSE, CAPILLARY
Glucose-Capillary: 129 mg/dL — ABNORMAL HIGH (ref 70–99)
Glucose-Capillary: 148 mg/dL — ABNORMAL HIGH (ref 70–99)
Glucose-Capillary: 130 mg/dL — ABNORMAL HIGH (ref 70–99)
Glucose-Capillary: 226 mg/dL — ABNORMAL HIGH (ref 70–99)

## 2022-01-25 LAB — VITAMIN B12: Vitamin B-12: 492 pg/mL (ref 180–914)

## 2022-01-25 MED ORDER — TORSEMIDE 20 MG PO TABS
60.0000 mg | ORAL_TABLET | Freq: Two times a day (BID) | ORAL | Status: DC
  Administered 2022-01-25 – 2022-01-26 (×3): 60 mg via ORAL
  Filled 2022-01-25 (×3): qty 3

## 2022-01-25 MED ORDER — IPRATROPIUM-ALBUTEROL 0.5-2.5 (3) MG/3ML IN SOLN
3.0000 mL | Freq: Once | RESPIRATORY_TRACT | Status: AC
  Administered 2022-01-25: 3 mL via RESPIRATORY_TRACT
  Filled 2022-01-25: qty 3

## 2022-01-25 NOTE — Progress Notes (Signed)
[facility] Progress Note   Subjective:    Maybe plans for discharge today -  her metolazone and torsemide were stopped yesterday for unclear reasons-  she was more in than out as a result -  kidney function essentially stable -  possibly going to SNF today - she is worried  Objective Vitals:   01/24/22 2122 01/25/22 0003 01/25/22 0520 01/25/22 0800  BP: (!) 134/36 (!) 110/30 (!) 126/52 (!) 114/36  Pulse: 98  89 82  Resp:   (!) 22 (!) 21  Temp:   97.9 F (36.6 C) 97.7 F (36.5 C)  TempSrc:   Oral Oral  SpO2:   99% 97%  Weight:   135.3 kg   Height:       PHYSICAL EXAM: Gen: in chair looking tired Eyes: anicteric ENT: MMM Neck: supple, ^ JVD at 45 degrees CV:  Reg S1, S2, irreg with PVCs on monitor, no rub Abd:  soft obese Lungs: dec BS bases, ^ WOB with conversation GU: purewick with yellow urine in can Extr: Unna wraps to LE, 1+ pitting in thighs Neuro: conversant, nonfocal  Additional Objective Labs: Basic Metabolic Panel: Recent Labs  Lab 01/21/22 0146 01/22/22 0427 01/23/22 0347 01/24/22 0447 01/25/22 0503  NA 141 142 140 138 138  K 3.8 3.4* 3.8 3.0* 3.3*  CL 101 101 100 94* 93*  CO2 32 34* 32 35* 36*  GLUCOSE 195* 219* 139* 136* 137*  BUN 36* 39* 37* 39* 41*  CREATININE 1.98* 1.96* 1.92* 2.06* 2.15*  CALCIUM 8.5* 8.5* 8.5* 8.8* 8.8*  PHOS 3.2 3.3  --   --  3.5   Liver Function Tests: Recent Labs  Lab 01/21/22 0146 01/22/22 0427 01/25/22 0503  ALBUMIN 2.5* 2.5* 2.7*   No results for input(s): LIPASE, AMYLASE in the last 168 hours. CBC: Recent Labs  Lab 01/19/22 0708 01/20/22 0328 01/22/22 0427 01/23/22 0347 01/24/22 0447  WBC 11.1* 10.4 10.7* 10.1 10.6*  HGB 9.3* 8.9* 9.2* 8.6* 8.8*  HCT 29.6* 28.8* 29.4* 28.5* 27.9*  MCV 104.2* 104.7* 104.6* 102.5* 102.2*  PLT 360 341 387 373 372   Blood Culture    Component Value Date/Time   SDES URINE, CLEAN CATCH 12/12/2021 1703   SPECREQUEST NONE 12/12/2021 1703   CULT   12/12/2021 1703    NO GROWTH Performed at [facility] [address], [place], [place] [address]    REPTSTATUS 12/13/2021 FINAL 12/12/2021 1703    Cardiac Enzymes: No results for input(s): CKTOTAL, CKMB, CKMBINDEX, TROPONINI in the last 168 hours. CBG: Recent Labs  Lab 01/24/22 0603 01/24/22 1123 01/24/22 1608 01/24/22 2017 01/25/22 0515  GLUCAP 133* 214* 169* 149* 129*   Iron Studies:  Recent Labs    01/23/22 0347  IRON 24*  TIBC 392  FERRITIN 77   @lablastinr3 @ Studies/Results: No results found. Medications:  ferric gluconate (FERRLECIT) IVPB Stopped (01/23/22 1411)    apixaban  2.5 mg Oral BID   darbepoetin (ARANESP) injection - NON-DIALYSIS  60 mcg Subcutaneous Q Tue-1800   diltiazem  30 mg Oral Q6H   gabapentin  100 mg Oral BID   insulin aspart  0-20 Units Subcutaneous TID WC   insulin aspart  10 Units Subcutaneous TID WC   insulin glargine-yfgn  20 Units Subcutaneous QHS   metoprolol tartrate  25 mg Oral BID   pantoprazole  40 mg Oral BID   potassium chloride  40 mEq Oral BID   sertraline  100 mg Oral Daily  sodium chloride flush  3 mL Intravenous Q12H    Assessment/Plan **AKI on CKD:  longstanding CKD dating back to at least 2016; follows with [facility].  Baseline Cr 2022 was in the 1.2-1.4 range until sepsis admission in 11/2021. Cr more in the 1.5-1.7 range and now during admission for CHF running more in the 2+ range. Suspect cardiorenal main issue here.  Renal US c/w CKD, UA bland.  BP not frankly low but question if AFib impacting renal perfusion to a degree.  At this point she has obligatory diuresis that needs to happen.  UOP ^d with now torsemide and metolazone-  did achieve diuresis  but barely.    -- Cr stable at 2 today.     - so far have been able to achieve a negative balance -  we can continue as OP -  would be stable for discharge is dispo is determined.  Now all diuretics have been stopped-  she will get into trouble off of diuretics-   it would be my preference to send her out on torsemide at least 80 BID -  I will arrange follow up with [facility] and he can continue to titrate diuretics    **AoC HFpEF: 11/2021 grade 2 DD.  Quite severe symptoms of volume overload.  She needs more aggressive diuresis. Diuretics per above, follow response and add metolazone today as well.  On low na diet, further fluid restrict to 1249mL/day. Assessing with echo -  did not look terrible    **Anemia:  normal in 11/2021; melena and angiodysplatic colon lesions on colonoscopy s/p therapy.  Hb stable in the 9s now.  have added ESA and  iron stores low-  repleting    **DM:  well controlled typically.  Meds per primary.    **A fib:  rate controlled on low dose dilt and metoprolol.  On eliquis.    **H/o HTN: has been lower since A fib, off amlodipine now.  Hypokalemia-   40 BID repletion  -  suspect will need to continue long term with the big dose diuretics she is on  She will need BMP every 2 weeks at the SNF just to watch potassium and bicarb and creatinine-  have them send it to [facility] fax [place]  01/25/2022, 9:05 AM  Bernie Pager: [phone]

## 2022-01-25 NOTE — Progress Notes (Addendum)
HD#[id number] SUBJECTIVE:  Patient Summary: Katelyn Mahoney is a 88 y.o. with a pertinent PMH of Afib on Eliquis, HFpEF, CKD stage 3b, chronic respiratory failure on 4 L, obesity who was hospitalized for acute HFpEF exacerbation and acute on chronic anemia.   Overnight Events: none  Interim History: Patient assessed at bedside this AM. She states that shortness of breath is about the same as prior to admission, she notices SOB with movement.  No other complaints or concerns today.   OBJECTIVE:  Vital Signs: Vitals:   01/25/22 1101 01/25/22 1103 01/25/22 1407 01/25/22 1528  BP: 131/68 129/75  (!) 103/32  Pulse: 69  82 91  Resp: 19 (!) 25 18 18   Temp:    97.6 F (36.4 C)  TempSrc:    Oral  SpO2: 93%  99% 96%  Weight:      Height:       Supplemental O2: Nasal Cannula SpO2: 96 % O2 Flow Rate (L/min): 4 L/min  Filed Weights   01/23/22 0500 01/24/22 0519 01/25/22 0520  Weight: 129.8 kg 135.7 kg 135.3 kg     Intake/Output Summary (Last 24 hours) at 01/25/2022 1702 Last data filed at 01/25/2022 1424 Gross per 24 hour  Intake 780 ml  Output 870 ml  Net -90 ml   Net IO Since Admission: -1,966.95 mL [01/25/22 1702]  Physical Exam: Physical Exam Constitutional:      Appearance: Normal appearance.  HENT:     Head: Normocephalic and atraumatic.  Eyes:     Extraocular Movements: Extraocular movements intact.  Cardiovascular:     Rate and Rhythm: Normal rate.     Pulses: Normal pulses.  Pulmonary:     Effort: Pulmonary effort is normal.     Breath sounds: Wheezing (expiratory Left > Right) and rales (bibasilar) present.  Abdominal:     General: There is no distension.     Tenderness: There is no abdominal tenderness.  Musculoskeletal:        General: Swelling (bilateral with unna boots in place) present. Normal range of motion.     Cervical back: Normal range of motion.  Skin:    General: Skin is warm and dry.  Neurological:     General: No focal deficit present.      Mental Status: She is alert and oriented to person, place, and time.    Patient Lines/Drains/Airways Status     Active Line/Drains/Airways     Name Placement date Placement time Site Days   External Urinary Catheter 01/23/22  1530  --  2   Pressure Injury 01/09/22 Toe (Comment  which one) Left Unstageable - Full thickness tissue loss in which the base of the injury is covered by slough (yellow, tan, gray, green or brown) and/or eschar (tan, brown or black) in the wound bed. unstageable, bl 01/09/22  2150  -- 16   Pressure Injury 01/10/22 Buttocks Medial Unstageable - Full thickness tissue loss in which the base of the injury is covered by slough (yellow, tan, gray, green or brown) and/or eschar (tan, brown or black) in the wound bed. unstageable pressure injury t 01/10/22  --  -- 15   Wound / Incision (Open or Dehisced) 01/09/22 Perineum Right red periwound with yellow tissue in center 01/09/22  2150  Perineum  16            Pertinent Labs: CBC Latest Ref Rng & Units 01/24/2022 01/23/2022 01/22/2022  WBC 4.0 - 10.5 K/uL 10.6(H) 10.1 10.7(H)  Hemoglobin 12.0 - 15.0  g/dL 8.8(L) 8.6(L) 9.2(L)  Hematocrit 36.0 - 46.0 % 27.9(L) 28.5(L) 29.4(L)  Platelets 150 - 400 K/uL 372 373 387    CMP Latest Ref Rng & Units 01/25/2022 01/24/2022 01/23/2022  Glucose 70 - 99 mg/dL 137(H) 136(H) 139(H)  BUN 8 - 23 mg/dL 41(H) 39(H) 37(H)  Creatinine 0.44 - 1.00 mg/dL 2.15(H) 2.06(H) 1.92(H)  Sodium 135 - 145 mmol/L 138 138 140  Potassium 3.5 - 5.1 mmol/L 3.3(L) 3.0(L) 3.8  Chloride 98 - 111 mmol/L 93(L) 94(L) 100  CO2 22 - 32 mmol/L 36(H) 35(H) 32  Calcium 8.9 - 10.3 mg/dL 8.8(L) 8.8(L) 8.5(L)  Total Protein 6.5 - 8.1 g/dL - - -  Total Bilirubin 0.3 - 1.2 mg/dL - - -  Alkaline Phos 38 - 126 U/L - - -  AST 15 - 41 U/L - - -  ALT 0 - 44 U/L - - -    Recent Labs    01/25/22 0515 01/25/22 1157 01/25/22 1611  GLUCAP 129* 148* 130*     Pertinent Imaging: CT CHEST WO CONTRAST  Result Date:  01/25/2022 CLINICAL DATA:  Chronic dyspnea. EXAM: CT CHEST WITHOUT CONTRAST TECHNIQUE: Multidetector CT imaging of the chest was performed following the standard protocol without IV contrast. RADIATION DOSE REDUCTION: This exam was performed according to the departmental dose-optimization program which includes automated exposure control, adjustment of the mA and/or kV according to patient size and/or use of iterative reconstruction technique. COMPARISON:  January 19, 2022. FINDINGS: Cardiovascular: Atherosclerosis of thoracic aorta is noted without aneurysm formation. Mild cardiomegaly is noted. No pericardial effusion is noted. Mild coronary artery calcifications are noted. Mediastinum/Nodes: No enlarged mediastinal or axillary lymph nodes. Thyroid gland, trachea, and esophagus demonstrate no significant findings. Lungs/Pleura: No pneumothorax is noted. Small bilateral pleural effusions are noted with adjacent subsegmental atelectasis. 7 mm subpleural nodule is noted in right upper lobe best seen on image number 77 of series 6. Mild mosaic pattern of both lungs is noted suggesting air trapping secondary to small airways disease or potentially mild multifocal inflammation. Upper Abdomen: No acute abnormality. Musculoskeletal: No chest wall mass or suspicious bone lesions identified. IMPRESSION: Small bilateral pleural effusions are noted with adjacent subsegmental atelectasis. 7 mm subpleural nodule seen in right upper lobe. Non-contrast chest CT at 6-12 months is recommended. If the nodule is stable at time of repeat CT, then future CT at 18-24 months (from today's scan) is considered optional for low-risk patients, but is recommended for high-risk patients. This recommendation follows the consensus statement: Guidelines for Management of Incidental Pulmonary Nodules Detected on CT Images: From the Fleischner Society 2017; Radiology 2017; 284:228-243. Mild mosaic pattern is noted throughout both lungs suggesting  air trapping secondary to small airways disease or potentially mild multifocal inflammation. Mild coronary calcifications are noted. Aortic Atherosclerosis (ICD10-I70.0). Electronically Signed   By: Marijo Conception M.D.   On: 01/25/2022 15:06    ASSESSMENT/PLAN:  Assessment: Principal Problem:   Acute exacerbation of CHF (congestive heart failure) (HCC) Active Problems:   AKI (acute kidney injury) ([place])   Gastrointestinal hemorrhage   Persistent atrial fibrillation (HCC)   Plan: #Acute on chronic HFpEF exacerbation #Chronic respiratory failure, on 3-4 L at home Patient developed some hypotension and presyncopal symptoms with elevated oral dose of torsemide and metolazone. She Continues to [facility] with lung exam despite ongoing diuresis. Additionally, Patients breath has recently declines over the last several months without further evaluation of her lung disease. Although patient denies tobacco use, she does endorse significant  passive smoke inhalation at home and work. Patient does not have any previous PFTs but I am concerned that she may have some underlying COPD versus bronchiectasis. - CT scan of chest w/o contrast - Will try duo-neb and assess improvement - decrease torsemide to 60 mg BID - Replete potassium - Strict I&O's and daily weights - Trend BMP  - Cont. Unna boots    #AKI on CKD3b Cont to have elevated Cr from baseline despite ongoing diuresis. Unclear etiology, but possibly Cardiorenal. This may be her new baseline kidney function. - Trend renal function - Strict I&O's as above - Avoid nephrotoxic agents    #Acute on chronic macrocytic anemia #Nonbleeding AVM No obvious signs of bleeding. Hgb stable from previous cbc. - Cont eliquis. - Protonix BID   #Atrial fibrillation - Cont Diltiazem, Metoprolol,  - Cont Eliquis - Cardiac monitoring    #Diabetes mellitus with peripheral neuropathy  - Cont insulin and SSI - Cont Gabapentin for neuropathy    #Coccyx wound -  Consult wound care  Best Practice: Diet: Diabetic diet IVF: none VTE: apixaban (ELIQUIS) tablet 2.5 mg Start: 01/12/22 2200 Place TED hose Start: 01/09/22 1332 Code: DNR AB: none Therapy Recs: SNF, DME:  Family Contact: unknown DISPO: Anticipated discharge tomorrow to Nursing Home pending Medical stability.  Signature: Lawerance Cruel, D.O.  Internal Medicine Resident, PGY-3 [facility]  Pager: [phone] 5:02 PM, 01/25/2022   Please contact the on call pager after 5 pm and on weekends at [phone].

## 2022-01-25 NOTE — Progress Notes (Signed)
Patient tolerated food well. She still experiences pain from wound dressing at the upper buttocks. She gets shortness of breath when preforming care actives.

## 2022-01-25 NOTE — Progress Notes (Signed)
Physical Therapy Treatment Patient Details Name: Katelyn Mahoney MRN: [id number] DOB: 11-Apr-1934 Today's Date: 01/25/2022   History of Present Illness Pt is an 88 y.o. female who presented 01/08/22 with SOB and weight gain. Pt admitted for acute on chronic HFpEF exacerbation. Pt also with melenic stools/heme positive, plan for EGD 01/10/22. S/p colonoscopy 1/13. PMH includes DM II, HTN, obesity, CKD, HLD, chronic LE edema, chronic hypoxic respiratory failure on 4L oxygen at home, Afib, HfpEF    PT Comments    The pt continues to make good progress with OOB mobility and activity tolerance this session. She completed 3 bouts of 25 ft ambulation with minG and use of RW, but continues to require prolonged seated rest to recover after each bout. The pt also continues to require 4LO2 with low of 88% after exertion that quickly recovered to 90s with seated rest. Will continue to benefit from skilled PT and max mobility to progress ambulation tolerance and endurance to allow for greater independence after d/c.     Recommendations for follow up therapy are one component of a multi-disciplinary discharge planning process, led by the attending physician.  Recommendations may be updated based on patient status, additional functional criteria and insurance authorization.  Follow Up Recommendations  Skilled nursing-short term rehab (<3 hours/day)     Assistance Recommended at Discharge Intermittent Supervision/Assistance  Patient can return home with the following A little help with walking and/or transfers;A little help with bathing/dressing/bathroom;Assistance with cooking/housework;Assist for transportation;Help with stairs or ramp for entrance   Equipment Recommendations  None recommended by PT    Recommendations for Other Services       Precautions / Restrictions Precautions Precautions: Fall;Other (comment) Precaution Comments: 4L O2 at baseline Restrictions Weight Bearing Restrictions: No      Mobility  Bed Mobility Overal bed mobility: Needs Assistance Bed Mobility: Supine to Sit     Supine to sit: Min assist, HOB elevated     General bed mobility comments: pt pulling on therapist instead of bed rail to complete, able to scoot at EOB once cued    Transfers Overall transfer level: Needs assistance Equipment used: Rolling walker (2 wheels) Transfers: Sit to/from Stand Sit to Stand: Min guard           General transfer comment: increased time and effort, cues to slow decent. completed x4 through session    Ambulation/Gait Ambulation/Gait assistance: Min guard Gait Distance (Feet): 25 Feet (x3) Assistive device: Rolling walker (2 wheels) Gait Pattern/deviations: Step-through pattern, Wide base of support, Decreased stride length Gait velocity: decreased Gait velocity interpretation: <1.31 ft/sec, indicative of household ambulator   General Gait Details: minG for line management, pt fatiguing after mobility in room, declined mobility into hall today but agreeable with recliner follow in future      Balance Overall balance assessment: Needs assistance Sitting-balance support: No upper extremity supported, Feet supported Sitting balance-Leahy Scale: Fair Sitting balance - Comments: supervision   Standing balance support: Reliant on assistive device for balance, Bilateral upper extremity supported Standing balance-Leahy Scale: Poor Standing balance comment: able to let go of UE support and bend outside BOS with static stance, BUE support due to fatigue with ambulation                            Cognition Arousal/Alertness: Awake/alert Behavior During Therapy: WFL for tasks assessed/performed Overall Cognitive Status: Within Functional Limits for tasks assessed  Exercises      General Comments General comments (skin integrity, edema, etc.): VSS on 4L. seated rest as RR increased to  30s with activtyq      Pertinent Vitals/Pain Pain Assessment Pain Assessment: No/denies pain Pain Intervention(s): Monitored during session     PT Goals (current goals can now be found in the care plan section) Acute Rehab PT Goals Patient Stated Goal: to go home PT Goal Formulation: With patient Time For Goal Achievement: 02/08/22 Potential to Achieve Goals: Good Progress towards PT goals: Progressing toward goals    Frequency    Min 2X/week      PT Plan Current plan remains appropriate;Frequency needs to be updated       AM-PAC PT "6 Clicks" Mobility   Outcome Measure  Help needed turning from your back to your side while in a flat bed without using bedrails?: A Little Help needed moving from lying on your back to sitting on the side of a flat bed without using bedrails?: A Little Help needed moving to and from a bed to a chair (including a wheelchair)?: A Little Help needed standing up from a chair using your arms (e.g., wheelchair or bedside chair)?: A Little Help needed to walk in hospital room?: A Little Help needed climbing 3-5 steps with a railing? : Total 6 Click Score: 16    End of Session Equipment Utilized During Treatment: Gait belt;Oxygen Activity Tolerance: Patient limited by fatigue Patient left: in chair;with call bell/phone within reach Nurse Communication: Mobility status PT Visit Diagnosis: Muscle weakness (generalized) (M62.81);Unsteadiness on feet (R26.81);Other abnormalities of gait and mobility (R26.89);Difficulty in walking, not elsewhere classified (R26.2)     Time: 4166-0630 PT Time Calculation (min) (ACUTE ONLY): 25 min  Charges:  $Therapeutic Exercise: 23-37 mins                     West Carbo, PT, DPT   Acute Rehabilitation Department Pager #: [phone]   Sandra Cockayne 01/25/2022, 5:41 PM

## 2022-01-25 NOTE — Plan of Care (Signed)

## 2022-01-25 NOTE — Progress Notes (Signed)
Mobility Specialist Progress Note:   01/25/22 1103  Therapy Vitals  Resp (!) 25  BP 129/75  Orthostatic Lying   BP- Lying 136/46  Pulse- Lying 90  Orthostatic Sitting  BP- Sitting 131/68  Pulse- Sitting 69  Orthostatic Standing at 0 minutes  BP- Standing at 0 minutes 129/75  Pulse- Standing at 0 minutes 100  Mobility  Bed Position Chair  Activity Ambulated with assistance in room  Level of Assistance Standby assist, set-up cues, supervision of patient - no hands on  Assistive Device Front wheel walker  Distance Ambulated (ft) 60 ft  Activity Response Tolerated well  $Mobility charge 1 Mobility   Pt received in chair willing to participate in mobility. No complaints of pain. Pt left in chair with call bell in reach and all needs met.    [facility] Public librarian Phone [phone] Secondary Phone [phone]

## 2022-01-25 NOTE — Progress Notes (Incomplete)
HD#16 Subjective:  Overnight Events: None  Patient assessed at bedside this AM. She states that shortness of breath is about the same as prior to admission, she notices SOB with movement.  No other complaints or concerns today.   Objective:  Vital signs in last 24 hours: Vitals:   01/24/22 2122 01/25/22 0003 01/25/22 0520 01/25/22 0800  BP: (!) 134/36 (!) 110/30 (!) 126/52 (!) 114/36  Pulse: 98  89 82  Resp:   (!) 22 (!) 21  Temp:   97.9 F (36.6 C) 97.7 F (36.5 C)  TempSrc:   Oral Oral  SpO2:   99% 97%  Weight:   135.3 kg   Height:       Supplemental O2: Nasal Cannula SpO2: 97 % O2 Flow Rate (L/min): 4 L/min  Physical Exam:  Physical Exam HENT:     Head: Normocephalic.  Eyes:     General:        Right eye: No discharge.        Left eye: No discharge.     Conjunctiva/sclera: Conjunctivae normal.  Cardiovascular:     Rate and Rhythm: Normal rate and regular rhythm.     Heart sounds: Normal heart sounds.     Comments: +1 bilateral LE Pulmonary:     Effort: Pulmonary effort is normal.     Comments: Crackles at bilateral lung bases.  Skin:    General: Skin is warm.  Neurological:     Mental Status: She is alert.  Psychiatric:        Mood and Affect: Mood normal.        Behavior: Behavior normal.    Filed Weights   01/23/22 0500 01/24/22 0519 01/25/22 0520  Weight: 129.8 kg 135.7 kg 135.3 kg     Intake/Output Summary (Last 24 hours) at 01/25/2022 0909 Last data filed at 01/25/2022 0500 Gross per 24 hour  Intake 777 ml  Output 800 ml  Net -23 ml    Net IO Since Admission: -1,626.95 mL [01/25/22 0909]  Pertinent Labs: CBC Latest Ref Rng & Units 01/24/2022 01/23/2022 01/22/2022  WBC 4.0 - 10.5 K/uL 10.6(H) 10.1 10.7(H)  Hemoglobin 12.0 - 15.0 g/dL 8.8(L) 8.6(L) 9.2(L)  Hematocrit 36.0 - 46.0 % 27.9(L) 28.5(L) 29.4(L)  Platelets 150 - 400 K/uL 372 373 387    CMP Latest Ref Rng & Units 01/25/2022 01/24/2022 01/23/2022  Glucose 70 - 99 mg/dL 137(H)  136(H) 139(H)  BUN 8 - 23 mg/dL 41(H) 39(H) 37(H)  Creatinine 0.44 - 1.00 mg/dL 2.15(H) 2.06(H) 1.92(H)  Sodium 135 - 145 mmol/L 138 138 140  Potassium 3.5 - 5.1 mmol/L 3.3(L) 3.0(L) 3.8  Chloride 98 - 111 mmol/L 93(L) 94(L) 100  CO2 22 - 32 mmol/L 36(H) 35(H) 32  Calcium 8.9 - 10.3 mg/dL 8.8(L) 8.8(L) 8.5(L)  Total Protein 6.5 - 8.1 g/dL - - -  Total Bilirubin 0.3 - 1.2 mg/dL - - -  Alkaline Phos 38 - 126 U/L - - -  AST 15 - 41 U/L - - -  ALT 0 - 44 U/L - - -    Imaging: No results found.  Assessment/Plan:   Principal Problem:   Acute exacerbation of CHF (congestive heart failure) (HCC) Active Problems:   AKI (acute kidney injury) ([place])   Gastrointestinal hemorrhage   Persistent atrial fibrillation [facility])   Patient Summary: Katelyn Mahoney is a 88 y.o. with a pertinent PMH of Afib on Eliquis, HFpEF, CKD stage 3b, chronic respiratory failure on 4 L, obesity  who was hospitalized for acute HFpEF exacerbation and acute on chronic anemia.   #Acute on chronic HFpEF exacerbation #Chronic respiratory failure, on 3-4 L at home She had 2100 cc of output last night.  Her weight changed from 286 pound to 299.  This may reflect her true weight, down from 302 pounds 2 days ago.  We were concerned about low blood pressure and possible overdiuresis.  Have discussed with nephrology and decided to continue torsemide and metolazone.  We will monitor her blood pressure closely today. - Appreciate nephrology recommendations - Continue torsemide 100 mg p.o. BID and Metolazone - Replete potassium - Strict I&O's and daily weights - Trend BMP  - Unna boots    #AKI on CKD3b Suspect cardiorenal. Renal ultrasound UA unremarkable.  Creatinine is relatively stable from the day prior.  Suspect this is her new baseline creatinine. - Continue metolazone, torsemide - Trend renal function - Strict I&O's as above - Avoid nephrotoxic agents    #Acute on chronic macrocytic anemia #Nonbleeding AVM EGD  showed a single nonbleeding angiodysplastic lesion in the duodenum that was treated with monopolar probe. Colonoscopy showed 3 nonbleeding AVMs that was cauterized.  Patient is hemodynamically stable with stable hemoglobin values.  Iron stores are low, will give for Ferrlecit per nephro today. - IV iron today - Continue Eliquis - Protonix 40mg  po bid    #Atrial fibrillation Currently rate-controlled. - Diltiazem 30mg  q6h  - Metoprolol 25mg  bid  - Continue Eliquis - Cardiac monitoring    #Diabetes mellitus with peripheral neuropathy  Patient is on 34U basal insulin and 6u tid meal time coverage at home. Most recent HbA1c of 7.4.  Fasting CBG of 133. - Continue Semglee to 20U nightly - Continue NovoLog 7 units 3 times daily with meals - SSI with meals - Continue home gabapentin    #Possible sacral ulcer - Consult wound care and will evaluate her wound  Diet: HH Carb IVF: None,None VTE: NOAC Code: DNR PT/OT recs: SNF for Subacute PT TOC: Pending approval from [place]: Anticipated discharge to Home pending diuresis, dispo planning SNF vs [place], DO Internal Medicine Residency My pager: [phone]   Please contact the on call pager after 5 pm and on weekends at [phone].

## 2022-01-26 ENCOUNTER — Telehealth: Payer: Self-pay | Admitting: Pulmonary Disease

## 2022-01-26 DIAGNOSIS — R911 Solitary pulmonary nodule: Secondary | ICD-10-CM

## 2022-01-26 LAB — CBC
HCT: 29.1 % — ABNORMAL LOW (ref 36.0–46.0)
Hemoglobin: 8.8 g/dL — ABNORMAL LOW (ref 12.0–15.0)
MCH: 31 pg (ref 26.0–34.0)
MCHC: 30.2 g/dL (ref 30.0–36.0)
MCV: 102.5 fL — ABNORMAL HIGH (ref 80.0–100.0)
Platelets: 421 10*3/uL — ABNORMAL HIGH (ref 150–400)
RBC: 2.84 MIL/uL — ABNORMAL LOW (ref 3.87–5.11)
RDW: 15.3 % (ref 11.5–15.5)
WBC: 11.3 10*3/uL — ABNORMAL HIGH (ref 4.0–10.5)
nRBC: 0.5 % — ABNORMAL HIGH (ref 0.0–0.2)

## 2022-01-26 LAB — COMPREHENSIVE METABOLIC PANEL
ALT: 15 U/L (ref 0–44)
AST: 18 U/L (ref 15–41)
Albumin: 2.6 g/dL — ABNORMAL LOW (ref 3.5–5.0)
Alkaline Phosphatase: 71 U/L (ref 38–126)
Anion gap: 6 (ref 5–15)
BUN: 42 mg/dL — ABNORMAL HIGH (ref 8–23)
CO2: 39 mmol/L — ABNORMAL HIGH (ref 22–32)
Calcium: 8.7 mg/dL — ABNORMAL LOW (ref 8.9–10.3)
Chloride: 93 mmol/L — ABNORMAL LOW (ref 98–111)
Creatinine, Ser: 2.26 mg/dL — ABNORMAL HIGH (ref 0.44–1.00)
GFR, Estimated: 21 mL/min — ABNORMAL LOW (ref >60)
Glucose, Bld: 181 mg/dL — ABNORMAL HIGH (ref 70–99)
Potassium: 3.1 mmol/L — ABNORMAL LOW (ref 3.5–5.1)
Sodium: 138 mmol/L (ref 135–145)
Total Bilirubin: 0.5 mg/dL (ref 0.3–1.2)
Total Protein: 6 g/dL — ABNORMAL LOW (ref 6.5–8.1)

## 2022-01-26 LAB — MAGNESIUM: Magnesium: 2 mg/dL (ref 1.7–2.4)

## 2022-01-26 LAB — GLUCOSE, CAPILLARY
Glucose-Capillary: 157 mg/dL — ABNORMAL HIGH (ref 70–99)
Glucose-Capillary: 207 mg/dL — ABNORMAL HIGH (ref 70–99)

## 2022-01-26 LAB — RESP PANEL BY RT-PCR (FLU A&B, COVID) ARPGX2
Influenza A by PCR: NEGATIVE
Influenza B by PCR: NEGATIVE
SARS Coronavirus 2 by RT PCR: NEGATIVE

## 2022-01-26 MED ORDER — IPRATROPIUM-ALBUTEROL 0.5-2.5 (3) MG/3ML IN SOLN: 3.0000 mL | Freq: Four times a day (QID) | RESPIRATORY_TRACT | Status: DC

## 2022-01-26 MED ORDER — METOPROLOL TARTRATE 25 MG PO TABS: 25.0000 mg | ORAL_TABLET | Freq: Two times a day (BID) | ORAL | 2 refills | Status: AC

## 2022-01-26 MED ORDER — IPRATROPIUM-ALBUTEROL 0.5-2.5 (3) MG/3ML IN SOLN: 3.0000 mL | Freq: Two times a day (BID) | RESPIRATORY_TRACT | Status: DC

## 2022-01-26 MED ORDER — TORSEMIDE 60 MG PO TABS: 60.0000 mg | ORAL_TABLET | Freq: Two times a day (BID) | ORAL | 2 refills | Status: AC

## 2022-01-26 MED ORDER — ALBUTEROL SULFATE (2.5 MG/3ML) 0.083% IN NEBU: 2.5000 mg | INHALATION_SOLUTION | RESPIRATORY_TRACT | Status: DC | PRN

## 2022-01-26 MED ORDER — ALBUTEROL SULFATE HFA 108 (90 BASE) MCG/ACT IN AERS: 2.0000 | INHALATION_SPRAY | Freq: Four times a day (QID) | RESPIRATORY_TRACT | 2 refills | Status: AC | PRN

## 2022-01-26 MED ORDER — POTASSIUM CHLORIDE CRYS ER 20 MEQ PO TBCR
40.0000 meq | EXTENDED_RELEASE_TABLET | Freq: Two times a day (BID) | ORAL | Status: AC
  Administered 2022-01-26 (×2): 40 meq via ORAL
  Filled 2022-01-26 (×2): qty 2

## 2022-01-26 MED ORDER — SPIRIVA RESPIMAT 2.5 MCG/ACT IN AERS: 1.0000 | INHALATION_SPRAY | Freq: Every day | RESPIRATORY_TRACT | 2 refills | Status: AC

## 2022-01-26 MED ORDER — POTASSIUM CHLORIDE CRYS ER 20 MEQ PO TBCR: 40.0000 meq | EXTENDED_RELEASE_TABLET | Freq: Once | ORAL | 0 refills | Status: DC

## 2022-01-26 MED ORDER — POLYSACCHARIDE IRON COMPLEX 150 MG PO CAPS: 150.0000 mg | ORAL_CAPSULE | Freq: Every day | ORAL | 2 refills | Status: AC

## 2022-01-26 MED ORDER — IPRATROPIUM-ALBUTEROL 0.5-2.5 (3) MG/3ML IN SOLN
3.0000 mL | Freq: Four times a day (QID) | RESPIRATORY_TRACT | 2 refills | Status: AC | PRN
Start: 2022-01-26 — End: 2022-04-26

## 2022-01-26 MED ORDER — POTASSIUM CHLORIDE CRYS ER 20 MEQ PO TBCR: 40.0000 meq | EXTENDED_RELEASE_TABLET | Freq: Every day | ORAL | 0 refills | Status: DC

## 2022-01-26 MED ORDER — IPRATROPIUM-ALBUTEROL 0.5-2.5 (3) MG/3ML IN SOLN
3.0000 mL | Freq: Four times a day (QID) | RESPIRATORY_TRACT | Status: DC
  Administered 2022-01-26: 3 mL via RESPIRATORY_TRACT
  Filled 2022-01-26: qty 3

## 2022-01-26 NOTE — Telephone Encounter (Signed)
HFU appt has been scheduled for pt. This will show up on pt's AVS once she is discharged from the hospital. If needed to cancel/reschedule appt, pt can call the office to get that taken care of. Nothing further needed.

## 2022-01-26 NOTE — Consult Note (Addendum)
NAME:  Katelyn Mahoney, MRN:  [id number], DOB:  09-Mar-1934, LOS: 9 ADMISSION DATE:  01/08/2022, CONSULTATION DATE: 01/26/2022 REFERRING MD: Dr. Saverio Danker, CHIEF COMPLAINT: Shortness of breath, abnormal CT scan of the chest  History of Present Illness:  Asked to see patient for abnormal CT scan of the chest showing lung nodule, air trapping, lung nodule.  She has a past history of A. fib on Eliquis, heart failure with preserved ejection fraction, chronic kidney disease stage IIIb, chronic respiratory failure on 4 L of oxygen, chronic anemia Admitted with exacerbation of the heart failure  She does admit to shortness of breath with activity  Never smoker Spouse did smoke, worked in an environment for about 5 years where she was exposed to somebody who smoked constantly No past history of asthma or wheezing or chronic cough No significant history of allergies  No personal history of cancer Did have hysterectomy performed for abnormal Pap over 40 years ago  Pertinent  Medical History   Past Medical History:  Diagnosis Date   Allergy    Anxiety    Arthritis    Arthritis    Callus    Depression    Diabetes mellitus without complication ([place])    Hyperlipidemia    Hypertension     Significant Hospital Events: Including procedures, antibiotic start and stop dates in addition to other pertinent events   1/27 shortness of breath with activity  Interim History / Subjective:  Breathing feels about the same No significant cough, no significant chest pain or chest discomfort  Objective   Blood pressure (!) 96/48, pulse 99, temperature (!) 97.5 F (36.4 C), temperature source Oral, resp. rate (!) 28, height 5\' 6"  (1.676 m), weight 135.3 kg, SpO2 97 %.        Intake/Output Summary (Last 24 hours) at 01/26/2022 1050 Last data filed at 01/26/2022 0850 Gross per 24 hour  Intake 900 ml  Output 1500 ml  Net -600 ml   Filed Weights   01/23/22 0500 01/24/22 0519 01/25/22 0520  Weight: 129.8  kg 135.7 kg 135.3 kg    Examination: General: Elderly lady, does appear comfortable HENT: Moist oral mucosa Lungs: Rales at the bases bilaterally Cardiovascular: S1-S2 appreciated Abdomen: Bowel sounds appreciated   CT scan reviewed by myself showing a right upper lobe small lung nodule Groundglass changes with some evidence of air trapping  BUN of 42/creatinine 2.26, potassium 3.1 White count of 11.3, hematocrit of 29.1 Respiratory viral panel negative  Cumulative I&O -1.5 L  Echocardiogram 01/22/2022 reviewed showing normal ejection fraction, some atrial dilatation  Resolved Hospital Problem list     Assessment & Plan:  Shortness of breath is multifactorial -Heart failure with reduced ejection fraction -Fluid status is currently being optimized -May be some element of deconditioning -It may be underlying obstructive airway disease  Acute on chronic hypoxemic respiratory failure -Increased O2 requirement may be related to exacerbation of the heart failure -Continue to optimize diuresis and avoid dehydration  Possible underlying obstructive airway disease -Only way to ascertain definitively will be to have a pulmonary function test performed -She admits to exposure to secondhand smoking -Air trapping on CT would usually be consistent with some obstructive pathology -Continue bronchodilators -Does not have a history of asthma  Lung nodule -Needs followed up radiologically with a CT in about 6 months  Bilateral pleural effusion -Continue with optimizing diuresis -I do believe this will self resolve -No indication for invasive intervention  Acute kidney injury on chronic kidney disease -Avoid  nephrotoxic agents -Trend renal parameters -Maintain renal perfusion  History of atrial fibrillation -On diltiazem/metoprolol/Eliquis  Diabetes -Continue SSI  May be followed up as outpatient with pulmonary Stable for discharge from a pulmonary perspective -Will need  some rehab for deconditioning  Sherrilyn Rist, MD [place] [facility] Pager: See Shea Evans

## 2022-01-26 NOTE — Progress Notes (Signed)
Patient notified RN of una boots not being changed since the beginning of the week. RN removed them and paged ortho tech to replace Edwena Blow, RN

## 2022-01-26 NOTE — TOC Transition Note (Addendum)
Transition of Care [facility]) - CM/SW Discharge Note   Patient Details  Name: Katelyn Mahoney MRN: [id number] Date of Birth: 1934-04-12  Transition of Care [facility]) CM/SW Contact:  Zenon Mayo, RN Phone Number: 01/26/2022, 12:31 PM   Clinical Narrative:    NCM , CSW and supervisor went in to speak with patient regarding her insurance.  Her insurance will not cover SNF, so they alternative is for her to private pay , which she states she can not do, or go home with [facility]. She states she will go home with [facility].  NCM already has her set up with [facility] and patient states she would like a nebulizer machine, NCM notified MD of this information.  NCM will contact daughter to inform her of this information as well. NCM informed daughter of this information ,she was appreciative of [place] letting her know. She will transport patient home at discharge and bring her oxygen tank , daughter states she has a doctor's apt at 40 but will check her phone.  NCM notified [place] patient is for dc today.  NCM notified daughter of dc orders in and to bring oxygen tank for patient to go home with.  Also staff RN states patient will be ready by 3 pm.       Final next level of care: [place] Barriers to Discharge: No Barriers Identified   Patient Goals and CMS Choice Patient states their goals for this hospitalization and ongoing recovery are:: home with [facility] CMS Medicare.gov Compare Post Acute Care list provided to:: Patient Choice offered to / list presented to : Patient  Discharge Placement                       Discharge Plan and Services In-house Referral: Clinical Social Work   Post Acute Care Choice: [place]          DME Arranged: Nebulizer machine DME Agency: [employer] Date DME Agency Contacted: 01/26/22 Time DME Agency Contacted: 46 Representative spoke with at DME Agency: [place]: RN, PT, Nurse's Aide, Social Work [employer] Agency: [place] Date [place]: 01/17/22 Time [place]: [place] Representative spoke with at [place]: [place] ([place]) Interventions     Readmission Risk Interventions No flowsheet data found.

## 2022-01-26 NOTE — Progress Notes (Signed)
Mobility Specialist Progress Note:   01/26/22 1416  Mobility  Bed Position Chair  Activity Ambulated with assistance in hallway  Level of Assistance Standby assist, set-up cues, supervision of patient - no hands on  Assistive Device Front wheel walker  Distance Ambulated (ft) 40 ft  Activity Response Tolerated well  $Mobility charge 1 Mobility   During Mobility:  120 HR  Pt received in chair willing to participate in mobility. No complaints of pain and asymptomatic. Pt left in chair with call bell in reach and all needs met.   [facility] Public librarian Phone [phone] Secondary Phone [phone]

## 2022-01-26 NOTE — Progress Notes (Incomplete)
HD#17 SUBJECTIVE:  Patient Summary: Katelyn Mahoney is a 88 y.o. with a pertinent PMH of Afib on Eliquis, HFpEF, CKD stage 3b, chronic respiratory failure on 4 L, obesity who was hospitalized for acute HFpEF exacerbation and acute on chronic anemia.   Overnight Events: tolerated PT well. Did not get orthostatic vitals.   Interim History: Patient assessed at bedside this am, she was originally sleeping. She denies cough and  OBJECTIVE:  Vital Signs: Vitals:   01/25/22 1407 01/25/22 1528 01/25/22 1724 01/26/22 0450  BP:  (!) 103/32 129/60 (!) 129/58  Pulse: 82 91 (!) 102 (P) 90  Resp: 18 18 20 19   Temp:  97.6 F (36.4 C)  (P) 97.6 F (36.4 C)  TempSrc:  Oral  (P) Oral  SpO2: 99% 96% 99% 97%  Weight:      Height:       Supplemental O2: {NAMES:3044014::"Room Air","Nasal Cannula","Simple Face Mask","Partial Rebreather","HFNC","Non Rebreather","Venturi Mask","Bag Valve Mask"} SpO2: 97 % O2 Flow Rate (L/min): 4 L/min  Filed Weights   01/23/22 0500 01/24/22 0519 01/25/22 0520  Weight: 129.8 kg 135.7 kg 135.3 kg     Intake/Output Summary (Last 24 hours) at 01/26/2022 0641 Last data filed at 01/26/2022 0400 Gross per 24 hour  Intake 660 ml  Output 1720 ml  Net -1060 ml   Net IO Since Admission: -2,686.95 mL [01/26/22 0641]  Physical Exam: Physical Exam  Patient Lines/Drains/Airways Status     Active Line/Drains/Airways     Name Placement date Placement time Site Days   Peripheral IV 01/25/22 22 G 1" Posterior;Right Forearm 01/25/22  1957  Forearm  1   External Urinary Catheter 01/23/22  1530  --  3   Pressure Injury 01/09/22 Toe (Comment  which one) Left Unstageable - Full thickness tissue loss in which the base of the injury is covered by slough (yellow, tan, gray, green or brown) and/or eschar (tan, brown or black) in the wound bed. unstageable, bl 01/09/22  2150  -- 17   Pressure Injury 01/10/22 Buttocks Medial Unstageable - Full thickness tissue loss in which the  base of the injury is covered by slough (yellow, tan, gray, green or brown) and/or eschar (tan, brown or black) in the wound bed. unstageable pressure injury t 01/10/22  --  -- 16   Wound / Incision (Open or Dehisced) 01/09/22 Perineum Right red periwound with yellow tissue in center 01/09/22  2150  Perineum  17            Pertinent Labs: CBC Latest Ref Rng & Units 01/26/2022 01/24/2022 01/23/2022  WBC 4.0 - 10.5 K/uL 11.3(H) 10.6(H) 10.1  Hemoglobin 12.0 - 15.0 g/dL 8.8(L) 8.8(L) 8.6(L)  Hematocrit 36.0 - 46.0 % 29.1(L) 27.9(L) 28.5(L)  Platelets 150 - 400 K/uL 421(H) 372 373    CMP Latest Ref Rng & Units 01/26/2022 01/25/2022 01/24/2022  Glucose 70 - 99 mg/dL 181(H) 137(H) 136(H)  BUN 8 - 23 mg/dL 42(H) 41(H) 39(H)  Creatinine 0.44 - 1.00 mg/dL 2.26(H) 2.15(H) 2.06(H)  Sodium 135 - 145 mmol/L 138 138 138  Potassium 3.5 - 5.1 mmol/L 3.1(L) 3.3(L) 3.0(L)  Chloride 98 - 111 mmol/L 93(L) 93(L) 94(L)  CO2 22 - 32 mmol/L 39(H) 36(H) 35(H)  Calcium 8.9 - 10.3 mg/dL 8.7(L) 8.8(L) 8.8(L)  Total Protein 6.5 - 8.1 g/dL 6.0(L) - -  Total Bilirubin 0.3 - 1.2 mg/dL 0.5 - -  Alkaline Phos 38 - 126 U/L 71 - -  AST 15 - 41 U/L 18 - -  ALT 0 - 44 U/L 15 - -    Recent Labs    01/25/22 1611 01/25/22 2150 01/26/22 0603  GLUCAP 130* 226* 157*     Pertinent Imaging: CT CHEST WO CONTRAST  Result Date: 01/25/2022 Small bilateral pleural effusions are noted with adjacent subsegmental atelectasis. 7 mm subpleural nodule seen in right upper lobe. Non-contrast chest CT at 6-12 months is recommended. If the nodule is stable at time of repeat CT, then future CT at 18-24 months (from today's scan) is considered optional for low-risk patients, but is recommended for high-risk patients. This recommendation follows the consensus statement: Guidelines for Management of Incidental Pulmonary Nodules Detected on CT Images: From the Fleischner Society 2017; Radiology 2017; 284:228-243.   Mild mosaic pattern is  noted throughout both lungs suggesting air trapping secondary to small airways disease or potentially mild multifocal inflammation.   Mild coronary calcifications are noted. Aortic Atherosclerosis (ICD10-I70.0).   ASSESSMENT/PLAN:  Assessment: Principal Problem:   Acute exacerbation of CHF (congestive heart failure) (HCC) Active Problems:   AKI (acute kidney injury) ([place])   Gastrointestinal hemorrhage   Persistent atrial fibrillation (HCC)   Plan: #Acute on chronic HFpEF exacerbation #Chronic respiratory failure, on 3-4 L at home  - CT scan of chest w/o contrast - Will try duo-neb and assess improvement - decrease torsemide to 60 mg BID - Replete potassium - Strict I&O's and daily weights - Trend BMP  - Cont. Unna boots    #AKI on CKD3b Cont to have elevated Cr from baseline despite ongoing diuresis. Unclear etiology, but possibly Cardiorenal. This may be her new baseline kidney function. - Trend renal function - Strict I&O's as above - Avoid nephrotoxic agents    #Acute on chronic macrocytic anemia #Nonbleeding AVM No obvious signs of bleeding. Hgb stable from previous cbc. - Cont eliquis. - Protonix BID   #Atrial fibrillation - Cont Diltiazem, Metoprolol,  - Cont Eliquis - Cardiac monitoring    #Diabetes mellitus with peripheral neuropathy  - Cont insulin and SSI - Cont Gabapentin for neuropathy    #Coccyx wound - Consult wound care  Best Practice: Diet: {CHL DISCHARGE DIET:21201} IVF: Fluids: {Meds; iv fluids:31617}, Rate: {NAMES:3044014::"None","*** cc/hr x *** hrs","*** cc bolus"} VTE: apixaban (ELIQUIS) tablet 2.5 mg Start: 01/12/22 2200 Place TED hose Start: 01/09/22 1332 Code: {NAMES:3044014::"Full","DNR","DNI","DNR/DNI","Comfort Care","Unknown"} AB: *** Therapy Recs: {NAMES:3044014::"None","Pending","CIR","SNF","ALF","LTAC","Home Health"}, DME: {Assistive Devices FXJ:88325} Family Contact: ***, {Family:304960261::"to be notified."} DISPO:  Anticipated discharge {NAMES:3044014::"today","tomorrow","in *** days"} to {Discharge Destination:18313::"Home"} pending {BARRIERS TO DISCHARGE:24135}.  Signature: Lawerance Cruel, D.O.  Internal Medicine Resident, PGY-3 [facility]  Pager: [phone] 6:41 AM, 01/26/2022   Please contact the on call pager after 5 pm and on weekends at [phone].

## 2022-01-26 NOTE — TOC Initial Note (Signed)
Transition of Care [facility]) - Initial/Assessment Note    Patient Details  Name: Katelyn Mahoney MRN: [id number] Date of Birth: 1934-05-10  Transition [facility]) CM/SW Contact:    Zenon Mayo, RN Phone Number: 01/26/2022, 3:02 PM  Clinical Narrative:                 For dc home today, daughter will be here at 3 pm to transport patient home. Daughter will bring home oxygen to transport patient with. Nebulizer machine at bedside.  Expected Discharge Plan: [place] Barriers to Discharge: No Barriers Identified   Patient Goals and CMS Choice Patient states their goals for this hospitalization and ongoing recovery are:: home with [facility] CMS Medicare.gov Compare Post Acute Care list provided to:: Patient Choice offered to / list presented to : Patient  Expected Discharge Plan and Services Expected Discharge Plan: [place] In-house Referral: Clinical Social Work Discharge Planning Services: CM Consult Post Acute Care Choice: [place] arrangements for the past 2 months: Single Family Home Expected Discharge Date: 01/26/22               DME Arranged: Nebulizer machine DME Agency: [employer] Date DME Agency Contacted: 01/26/22 Time DME Agency Contacted: 41 Representative spoke with at DME Agency: Adela Lank HH Arranged: RN, PT, Nurse's Aide, Social Work [employer] Agency: [place] Date [place]: 01/17/22 Time [place]: [place] Representative spoke with at [place]: [place] Arrangements/Services Living arrangements for the past 2 months: [place] Lives with:: Adult Children Patient language and need for interpreter reviewed:: Yes Do you feel safe going back to the place where you live?: Yes      Need for Family Participation in Patient Care: Yes (Comment) Care giver support system in place?: Yes (comment) Current home services: DME Criminal Activity/Legal Involvement Pertinent  to Current Situation/Hospitalization: No - Comment as needed  Activities of Daily Living Home Assistive Devices/Equipment: Environmental consultant (specify type), Built-in shower seat, Cane (specify quad or straight) ADL Screening (condition at time of admission) Patient's cognitive ability adequate to safely complete daily activities?: Yes Is the patient deaf or have difficulty hearing?: No Does the patient have difficulty seeing, even when wearing glasses/contacts?: No Does the patient have difficulty concentrating, remembering, or making decisions?: No Patient able to express need for assistance with ADLs?: No Does the patient have difficulty dressing or bathing?: Yes Independently performs ADLs?: Yes (appropriate for developmental age) Does the patient have difficulty walking or climbing stairs?: Yes Weakness of Legs: Both Weakness of Arms/Hands: None  Permission Sought/Granted Permission sought to share information with : Facility Sport and exercise psychologist, Family Supports Permission granted to share information with : Yes, Verbal Permission Granted  Share Information with NAME: Mariann Laster  Permission granted to share info w AGENCY: SNFs  Permission granted to share info w Relationship: Daughter  Permission granted to share info w Contact Information: [phone]  Emotional Assessment Appearance:: Appears stated age Attitude/Demeanor/Rapport: Engaged Affect (typically observed): Appropriate Orientation: : Oriented to Self, Oriented to Place, Oriented to  Time, Oriented to Situation Alcohol / Substance Use: Not Applicable Psych Involvement: No (comment)  Admission diagnosis:  Acute diastolic congestive heart failure ([place]) [I50.31] AKI (acute kidney injury) ([place]) [N17.9] Acute on chronic respiratory failure (HCC) [J96.20] Gastrointestinal hemorrhage, unspecified gastrointestinal hemorrhage type [K92.2] Patient Active Problem List   Diagnosis Date Noted   Gastrointestinal hemorrhage    Persistent  atrial fibrillation ([place])    Acute  exacerbation of CHF (congestive heart failure) ([place]) 01/09/2022   Atrial fibrillation with RVR (HCC)    Acute congestive heart failure ([place])    Urinary tract infection 12/13/2021   AKI (acute kidney injury) ([place]) 12/13/2021   At risk for sepsis 12/13/2021   Primary osteoarthritis of both knees 03/09/2021   Urinary frequency 03/09/2021   Depression, major, single episode, in partial remission ([place]) 03/09/2021   Edema of lower extremity 08/20/2017   Hyperlipidemia 08/20/2017   CKD stage G3b/A1, GFR 30-44 and albumin creatinine ratio <30 mg/g (HCC) 02/27/2017   Allergic urticaria 08/02/2016   Fungal dermatitis 11/08/2015   Right leg pain 10/14/2015   Physical exam 11/09/2014   Dermatophytosis of nail 09/24/2013   Arthritis    Claw toe (acquired) 04/17/2013   Hammer toe, acquired 04/17/2013   Spherocytosis, hereditary ([place]) 08/30/2011   Diabetes mellitus ([address]) 10/17/2010   MORBID OBESITY 03/19/2008   Primary hypertension 03/19/2008   ROTATOR CUFF SPRAIN AND STRAIN 03/19/2008   Hyperlipidemia associated with type 2 diabetes mellitus ([place]) 09/05/2007   Depression 09/05/2007   ROSACEA 05/19/2007   PCP:  Ann Held, DO Pharmacy:   [facility] [place], [place] - [address] [place] [address] Phone: [phone] Fax: [phone]  [facility] - [place], [place] [address] [place] [address] Phone: [phone] Fax: [phone]     Social Determinants of Health (SDOH) Interventions    Readmission Risk Interventions Readmission Risk Prevention Plan 01/26/2022  Transportation Screening Complete  PCP or Specialist Appt within 3-5 Days Complete  HRI or [place] Complete  Social Work Consult for [place] Planning/Counseling Complete  Palliative Care Screening Not Applicable  Medication Review Human resources officer) Complete  Some recent data might be hidden

## 2022-01-26 NOTE — Care Management Important Message (Signed)
Important Message  Patient Details  Name: Katelyn Mahoney MRN: [id number] Date of Birth: [birth year]   Medicare Important Message Given:  Yes     Shelda Altes 01/26/2022, 10:16 AM

## 2022-01-26 NOTE — Discharge Summary (Addendum)
Name: Katelyn Mahoney MRN: [id number] DOB: July 08, 1934 88 y.o. PCP: Ann Held, DO  Date of Admission: 01/08/2022  5:04 PM Date of Discharge:  01/26/2022 Attending Physician: Dr.  Saverio Danker  DISCHARGE DIAGNOSIS:  Primary Problem: Acute exacerbation of CHF (congestive heart failure) [facility])   Hospital Problems: Principal Problem:   Acute exacerbation of CHF (congestive heart failure) (HCC) Active Problems:   AKI (acute kidney injury) ([place])   Gastrointestinal hemorrhage   Persistent atrial fibrillation (HCC)    DISCHARGE MEDICATIONS:   Allergies as of 01/26/2022       Reactions   Bupropion Hcl    Palmar rash   Penicillins    rash   Citalopram Hydrobromide Other (See Comments)   cough   Metformin And Related Diarrhea   Zinc Other (See Comments)   unknown ...   Blue Dyes (parenteral) Rash   Cobalt Rash   Gold-containing Drug Products Rash   Nickel Rash        Medication List     STOP taking these medications    furosemide 40 MG tablet Commonly known as: LASIX   levalbuterol 0.63 MG/3ML nebulizer solution Commonly known as: XOPENEX   metoprolol succinate 100 MG 24 hr tablet Commonly known as: TOPROL-XL       TAKE these medications    acetaminophen 650 MG CR tablet Commonly known as: TYLENOL 650 mg every 8 (eight) hours.   albuterol 108 (90 Base) MCG/ACT inhaler Commonly known as: VENTOLIN HFA Inhale 2 puffs into the lungs every 6 (six) hours as needed for wheezing or shortness of breath.   apixaban 2.5 MG Tabs tablet Commonly known as: ELIQUIS Take 1 tablet (2.5 mg total) by mouth 2 (two) times daily.   atorvastatin 20 MG tablet Commonly known as: LIPITOR TAKE 1/2 TABLET ON MONDAY, WEDNESDAY, FRIDAY AND      SUNDAYS What changed: See the new instructions.   BILBERRY PO Take 1 tablet by mouth daily.   cyanocobalamin 1000 MCG tablet Take 1 tablet (1,000 mcg total) by mouth daily.   diltiazem 120 MG 24 hr capsule Commonly known as:  CARDIZEM CD Take 1 capsule (120 mg total) by mouth daily.   fluocinonide cream 0.05 % Commonly known as: LIDEX Apply 1 application topically daily as needed (breakouts).   gabapentin 100 MG capsule Commonly known as: NEURONTIN TAKE 1 CAPSULE BY MOUTH 3 TIMES DAILY What changed:  how to take this when to take this   GLUCOSAMINE PO Take 1 tablet by mouth daily.   insulin lispro 100 UNIT/ML KwikPen Commonly known as: HumaLOG KwikPen Inject 6 Units into the skin with breakfast, with lunch, and with evening meal. Inject 7-9 units under the skin three times daily before meals. E11.65   Insulin Pen Needle 32G X 4 MM Misc Use on insulin pens 4 times a day   ipratropium-albuterol 0.5-2.5 (3) MG/3ML Soln Commonly known as: DUONEB Take 3 mLs by nebulization every 6 (six) hours as needed.   iron polysaccharides 150 MG capsule Commonly known as: Nu-Iron Take 1 capsule (150 mg total) by mouth daily.   Loratadine 10 MG Caps Take 10 mg by mouth daily.   melatonin 3 MG Tabs tablet Take 1 tablet (3 mg total) by mouth at bedtime.   metoprolol tartrate 25 MG tablet Commonly known as: LOPRESSOR Take 1 tablet (25 mg total) by mouth 2 (two) times daily.   multivitamin-lutein Caps capsule Take 1 capsule by mouth daily.   NONFORMULARY OR COMPOUNDED ITEM Lift chair  #  1   Dx osteoarthritis knees   OneTouch Delica Lancets 07P Misc Use to test blood sugar once daily- E11.65   OneTouch Verio test strip Generic drug: glucose blood USE TO TEST BLOOD SUGAR    TWICE A DAY   Ozempic (1 MG/DOSE) 4 MG/3ML Sopn Generic drug: Semaglutide (1 MG/DOSE) INJECT 1MG  SUBCUTANEOUSLY  WEEKLY (EVERY 7 DAYS) What changed: See the new instructions.   pantoprazole 40 MG tablet Commonly known as: PROTONIX Take 1 tablet (40 mg total) by mouth daily.   potassium chloride SA 20 MEQ tablet Commonly known as: KLOR-CON M Take 2 tablets (40 mEq total) by mouth daily.   sertraline 100 MG tablet Commonly  known as: ZOLOFT TAKE 1 TABLET DAILY What changed:  how much to take how to take this when to take this additional instructions   sodium chloride 0.65 % Soln nasal spray Commonly known as: OCEAN Place 1 spray into both nostrils as needed for congestion.   Spiriva Respimat 2.5 MCG/ACT Aers Generic drug: Tiotropium Bromide Monohydrate Inhale 1 puff into the lungs daily.   Torsemide 60 MG Tabs Take 60 mg by mouth 2 (two) times daily.   Toujeo SoloStar 300 UNIT/ML Solostar Pen Generic drug: insulin glargine (1 Unit Dial) INJECT 34 UNITS            SUBCUTANEOUSLY DAILY               Durable Medical Equipment  (From admission, onward)           Start     Ordered   01/26/22 1019  For home use only DME Nebulizer/meds  Once       Question Answer Comment  Patient needs a nebulizer to treat with the following condition COPD (chronic obstructive pulmonary disease) ([place])   Length of Need 6 Months      01/26/22 1019            DISPOSITION AND FOLLOW-UP:  Katelyn Mahoney was discharged from [facility] in [place] condition. At the hospital follow up visit please address:  Follow-up Recommendations: Consults: Pulmonary medicine and Nephrology [facility]), Additionally patient will need repeat lower extremity wraps/Unna boots.  Labs: CBC and Comprehensive Metabolic Panel Studies: PFTs Medications: new medications -Torsemide 60 mg oral twice daily -Potassium 40 mill equivalents daily -Iron polysaccharide 150 mg daily -Albuterol rescue inhaler -Spiriva daily -DuoNebs as needed -Metoprolol tartrate 25 mg twice daily  Follow-up Appointments:  Follow-up Information     [facility] Follow up.   Specialty: Home Health Services Why: Home Health Nurse, Home Health Physical Therapy, [place], Social Worker- Agency will call to set up apt times. Contact information: [address] [place]  [address] [phone]         Ann Held, DO. Go on 02/02/2022.   Specialty: Family Medicine Why: at 11:20 am Contact information: [address] [place] [address] [phone]         [facility] Follow up.   Specialty: Pulmonology Contact information: [address] [place] [address] [phone]        Corliss Parish, MD. Schedule an appointment as soon as possible for a visit.   Specialty: Nephrology Contact information: [place] [address] [place], [facility] Follow up.   Why: nebulizer machine Contact information: [place] [address]  [phone]                 HOSPITAL COURSE:  Patient Summary: #Acute on chronic HFpEF exacerbation # Chronic respiratory failure Patient is presenting with several weeks of worsening bilateral lower extremity edema, weight gain and progressively worsening dyspnea on exertion and orthopnea. She has been titrating up her lasix dosing as outpatient; however, patient reports that she has not noted any significant improvement. She has also reported progressively worsening dyspnea on exertion and orthopnea. Patient was normotensive, slightly tachycardic to 100-120s, saturating 98-100% on 4 L O2.   On examination, patient appeared to be significantly hypervolemic with bilateral lower extremity pitting edema and diffuse crackles, worse at bilateral bases.  BNP 376. She was given IV Lasix 80 mg once in the ED.  Unna boots were applied, strict I's and O's and daily weights were recorded, and BMP was trended throughout her hospitalization.  On day 2 of her hospitalization, we started scheduled IV Lasix 80 mg twice daily and continue to monitor volume status.  The following day, the patient endorsed improvement of her BLE edema as result of the IV Lasix.  Ins and outs were inaccurate in the setting of  incontinence, however she appeared to improve clinically on exam.  The same Lasix regimen was also continued the following day given improvement of her volume status again. The patient was transitioned to oral Lasix on 1/18. However, though the patient had adequate UOP, her creatinine continued to rise. Additionally, on 1/20, the patient had worsening SOB and increased WOB. Due to concern for safety with DC on oral diuretics, we decided to keep the patient in the hospital, obtain CXR and further workup of her AKI, and consulted nephrology (see below). The same day, she was given IV Lasix 80 mg and CXR showed mild vascular congestion without edema. She was continued on IV diuresis, up to Lasix 120 mg TID and metolazone. Pt had improvement of her volume status on this regimen. On 01/24, pt was transitioned to oral torsemide and metolazone regimens. Given hypotension and positive orthostatics on 01/25, oral diuretics were held overnight until 01/26.  Patient's torsemide was decreased to 60 mg twice daily with improvement of her diuresis.  Additionally patient had CT chest showing concern for possible obstructive disease and was started on bronchodilator therapy.  Patient was discharged with outpatient nephrology and pulmonology follow-up to address her chronic volume overload and further evaluate her obstructive lung disease.  Patient was discharged on home oxygen of 4 L nasal cannula.  Additionally patient was sent home with albuterol rescue inhaler, Spiriva, and duo nebs with nebulizer machine.  Acute on chronic macrocytic anemia #Melena #Vitamin B12 deficiency #Iron deficiency Patient noted to have Hb 9.0 (baseline ~11 2 wks ago) in setting of melena that the patient reports has been present for two weeks. The patient was noted to be hemodynamically stable on arrival and denied episodes of melena in the hospital. She is not taking NSAIDs or iron supplements. FOBT positive in the ED. GI team was consulted, and  they recommend continued Protonix and an EGD.  EGD showed single nonbleeding angiodysplastic lesion in the duodenum that was treated with the monopolar probe.  The following day, she was given bowel prep in anticipation for colonoscopy on 1/13, which showed three small nonbleeding proximal colon AVMs; these were ablated.  Her Eliquis was held until after her colonoscopy.  Her CBC was trended throughout her hospitalization. Received IV iron, discharged on oral iron.  #Atrial  fibrillation Patient noted to have new onset atrial fibrillation during her previous admisison for which cardiology was consulted.  She reports that she has been compliant with her diltiazem, Eliquis, and metoprolol at home.  We continued her diltiazem and metoprolol regimens but held Eliquis until 1/13 in the setting of suspected GI bleed.  Patient was discontinued on metoprolol succinate and started on metoprolol tartrate at a decreased dose of 25 mg twice daily.  She was continued on diltiazem for rate control.  Otherwise, this was continued throughout her hospitalization.   #AKI on CKD3b Patient noted to have worsening sCr on admission with sCr up to 2.29 (baseline ~1.5-1.6 two weeks ago).  During her hospitalization, creatinine improved with diuresis however, started to uptrend again despite diuresis to ~2, and became stable at this level for a few days.  Obtained UA which showed moderate leukocytes and rare bacteria and a renal ultrasound which was unremarkable. Nephrology was consulted for their recommendations. At that time, patient was transitioned back to IV Lasix for several days. Mild decrease in Cr with a nadir of 1.92 until switched back to oral diuretics (torsemide and metolazone).  Patient had continued increase in her creatinine despite further diuresis.  There is concern for possible unmasking of worsening CKD.  She will has follow-up with [facility] in the outpatient setting.   #Diabetes mellitus with  peripheral neuropathy  Patient is on 34U basal insulin and 6u tid meal time coverage at home. Most recent HbA1c 7.4.  CBGs were well controlled on basal, mealtime, and sliding scale insulin in the hospital.  Her home gabapentin was continued throughout her hospitalization.   #Physical deconditioning Patient reports using a walker to assist with ambulation. She reports significant dyspnea on exertion. Suspect that this may be in part due to physical deconditioning.  Patient was previously admitted for SNF on discharge but elected to go home with home health. PT/OT recommended SNF, however patient was only amenable to 2 choices which were not offering beds at the time.  Patient chose to discharge on home health PT/T.   #Iron def anemia: Patient receive 2 rounds of IV iron in the inpatient setting  She has significant decreased saturation will likely need oral iron supplementation in the outpatient setting.  Send her home on iron polysaccharide daily.     DISCHARGE INSTRUCTIONS:   Discharge Instructions     Diet - low sodium heart healthy   Complete by: As directed    Discharge instructions   Complete by: As directed    FOLLOW-UP INSTRUCTIONS:  Thank you for allowing [place] to be part of your care. You were hospitalized for Heart Failure Exacerbation.  Please follow up with the following providers: A. [address] [place], DO, [address] / [place] [address], [phone]  B. [place] [facility] (see discharge paperwork for contact information)  C.  [facility] (see discharge paperwork for contact information)  Please note these changes made to your medications:   A. Medications to continue: Outpatient Medications Marked as Taking for the 01/08/22 encounter [facility] Encounter): acetaminophen (TYLENOL) 650 MG CR tablet, 650 mg every 8 (eight) hours. apixaban (ELIQUIS) 2.5 MG TABS tablet, Take 1 tablet (2.5 mg total) by mouth 2 (two) times  daily. atorvastatin (LIPITOR) 20 MG tablet, TAKE 1/2 TABLET ON MONDAY, WEDNESDAY, FRIDAY AND      SUNDAYS (Patient taking differently: Take 10 mg by mouth See admin instructions. TAKE 1/2 TABLET ON MONDAY, WEDNESDAY, FRIDAY AND  SUNDAYS) Bilberry, Vaccinium myrtillus, (BILBERRY PO), Take 1 tablet by mouth daily. cyanocobalamin 1000 MCG tablet, Take 1 tablet (1,000 mcg total) by mouth daily. diltiazem (CARDIZEM CD) 120 MG 24 hr capsule, Take 1 capsule (120 mg total) by mouth daily. fluocinonide cream (LIDEX) 4.31 %, Apply 1 application topically daily as needed (breakouts). gabapentin (NEURONTIN) 100 MG capsule, TAKE 1 CAPSULE BY MOUTH 3 TIMES DAILY (Patient taking differently: 100 mg 2 (two) times daily.) Glucosamine HCl (GLUCOSAMINE PO), Take 1 tablet by mouth daily. insulin lispro (HUMALOG KWIKPEN) 100 UNIT/ML KwikPen, Inject 6 Units into the skin with breakfast, with lunch, and with evening meal. Inject 7-9 units under the skin three times daily before meals. E11.65 levalbuterol (XOPENEX) 0.63 MG/3ML nebulizer solution, Take 3 mLs (0.63 mg total) by nebulization every 6 (six) hours as needed for wheezing or shortness of breath. Loratadine 10 MG CAPS, Take 10 mg by mouth daily. melatonin 3 MG TABS tablet, Take 1 tablet (3 mg total) by mouth at bedtime. multivitamin-lutein (OCUVITE-LUTEIN) CAPS capsule, Take 1 capsule by mouth daily. OZEMPIC, 1 MG/DOSE, 4 MG/3ML SOPN, INJECT 1MG  SUBCUTANEOUSLY  WEEKLY (EVERY 7 DAYS) (Patient taking differently: Inject 1 mg into the skin every Saturday.) pantoprazole (PROTONIX) 40 MG tablet, Take 1 tablet (40 mg total) by mouth daily. sertraline (ZOLOFT) 100 MG tablet, TAKE 1 TABLET DAILY (Patient taking differently: Take 100 mg by mouth daily.) sodium chloride (OCEAN) 0.65 % SOLN nasal spray, Place 1 spray into both nostrils as needed for congestion.      B. Medications to start: Albuterol inhaler 1-2 puffs every 6 hours as needed  Ipratropium-albuterol  (DUONEB) nebulizer every 6 hours as needed Tiotropium 1 inhaler daily  Potassium chloride 40 mEq, oral, daily  Torsemide 60 mg, oral, twice daily  Metoprolol 25 mg, oral, twice daily.   C. Medications to discontinue:  Metoprolol succinate  Furosemide  Please make sure to follow up with your PCP.  Please call our clinic if you have any questions or concerns, we may be able to help and keep you from a long and expensive emergency room wait. Our clinic and after hours phone number is [phone], the best time to call is Monday through Friday 9 am to 4 pm but there is always someone available 24/7 if you have an emergency. If you need medication refills please notify your pharmacy one week in advance and they will send [place] a request.   Increase activity slowly   Complete by: As directed    No wound care   Complete by: As directed        SUBJECTIVE:  Patient resting in chair.  She states that her shortness of breath is significantly improved with increased diuresis and bronchodilator therapy.  She states that she still short of breath but denies any dizziness with standing.  Orthostatics were performed today and negative.    OBJECTIVE:  Discharge Vitals:   BP (!) 117/48 (BP Location: Left Arm)    Pulse 84    Temp 98 F (36.7 C) (Oral)    Resp (!) 22    Ht 5\' 6"  (1.676 m)    Wt 135.3 kg    LMP  (LMP Unknown)    SpO2 97%    BMI 48.14 kg/m  Physical Exam Constitutional:      Appearance: She is obese.  HENT:     Head: Normocephalic and atraumatic.     Mouth/Throat:     Mouth: Mucous membranes are moist.  Eyes:  Extraocular Movements: Extraocular movements intact.  Cardiovascular:     Rate and Rhythm: Normal rate.     Pulses: Normal pulses.     Heart sounds: No murmur heard. Pulmonary:     Effort: Pulmonary effort is normal. No respiratory distress.     Breath sounds: Wheezing and rales present.  Abdominal:     General: Abdomen is flat. There is no distension.     Tenderness:  There is no abdominal tenderness.  Musculoskeletal:        General: Swelling (bilateral pitting edema) present. Normal range of motion.     Cervical back: Normal range of motion. No rigidity.  Skin:    General: Skin is warm and dry.  Neurological:     General: No focal deficit present.     Mental Status: She is alert.     Pertinent Labs, Studies, and Procedures:  CBC Latest Ref Rng & Units 01/26/2022 01/24/2022 01/23/2022  WBC 4.0 - 10.5 K/uL 11.3(H) 10.6(H) 10.1  Hemoglobin 12.0 - 15.0 g/dL 8.8(L) 8.8(L) 8.6(L)  Hematocrit 36.0 - 46.0 % 29.1(L) 27.9(L) 28.5(L)  Platelets 150 - 400 K/uL 421(H) 372 373    CMP Latest Ref Rng & Units 01/26/2022 01/25/2022 01/24/2022  Glucose 70 - 99 mg/dL 181(H) 137(H) 136(H)  BUN 8 - 23 mg/dL 42(H) 41(H) 39(H)  Creatinine 0.44 - 1.00 mg/dL 2.26(H) 2.15(H) 2.06(H)  Sodium 135 - 145 mmol/L 138 138 138  Potassium 3.5 - 5.1 mmol/L 3.1(L) 3.3(L) 3.0(L)  Chloride 98 - 111 mmol/L 93(L) 93(L) 94(L)  CO2 22 - 32 mmol/L 39(H) 36(H) 35(H)  Calcium 8.9 - 10.3 mg/dL 8.7(L) 8.8(L) 8.8(L)  Total Protein 6.5 - 8.1 g/dL 6.0(L) - -  Total Bilirubin 0.3 - 1.2 mg/dL 0.5 - -  Alkaline Phos 38 - 126 U/L 71 - -  AST 15 - 41 U/L 18 - -  ALT 0 - 44 U/L 15 - -    DG Chest 2 View  Result Date: 01/08/2022 CLINICAL DATA:  Shortness of breath. EXAM: CHEST - 2 VIEW COMPARISON:  Chest x-ray 12/26/2021. FINDINGS: Heart is enlarged, unchanged. There are small bilateral pleural effusions. There is central pulmonary vascular congestion. There are scattered bilateral interstitial opacities. There are minimal strandy opacities in the lung bases. There is no evidence for pneumothorax or acute fracture. IMPRESSION: 1. Cardiomegaly with mild interstitial edema and small bilateral pleural effusions. 2. Bibasilar opacities may represent edema or infection. Electronically Signed   By: Ronney Asters M.D.   On: 01/08/2022 17:53   CXR 01/19/22 IMPRESSION: Mild vascular congestion without edema    Improvement in bibasilar airspace disease.  Improved lung volumes.     Electronically Signed   By: Franchot Gallo M.D.   On: 01/19/2022 10:40  CT Chest 01/25/22 IMPRESSION: Small bilateral pleural effusions are noted with adjacent subsegmental atelectasis.   7 mm subpleural nodule seen in right upper lobe. Non-contrast chest CT at 6-12 months is recommended. If the nodule is stable at time of repeat CT, then future CT at 18-24 months (from today's scan) is considered optional for low-risk patients, but is recommended for high-risk patients. This recommendation follows the consensus statement: Guidelines for Management of Incidental Pulmonary Nodules Detected on CT Images: From the Fleischner Society 2017; Radiology 2017; 284:228-243.   Mild mosaic pattern is noted throughout both lungs suggesting air trapping secondary to small airways disease or potentially mild multifocal inflammation.   Mild coronary calcifications are noted.   Aortic Atherosclerosis (ICD10-I70.0).  Electronically Signed   By: Marijo Conception M.D.   On: 01/25/2022 15:06   Signed: Lawerance Cruel, D.O.  Internal Medicine Resident, PGY-3 [facility]  Pager: [phone] 1:43 PM, 01/26/2022

## 2022-01-26 NOTE — Progress Notes (Signed)
Orthopedic Tech Progress Note Patient Details:  Katelyn Mahoney [birth year] [id number]  Ortho Devices Type of Ortho Device: Louretta Parma boot Ortho Device/Splint Location: BLE Ortho Device/Splint Interventions: Ordered, Application   Post Interventions Patient Tolerated: Well Instructions Provided: Care of device  Katelyn Mahoney 01/26/2022, 1:47 PM

## 2022-01-26 NOTE — TOC Progression Note (Addendum)
Transition of Care [facility]) - Progression Note    Patient Details  Name: Katelyn Mahoney MRN: [id number] Date of Birth: 02/21/1934  Transition of Care [facility]) CM/SW Contact  Zenon Mayo, RN Phone Number: 01/26/2022, 10:00 AM  Clinical Narrative:    NCM , CSW and supervisor went in to speak with patient regarding her insurance.  Her insurance will not cover SNF, so they alternative is for her to private pay , which she states she can not do, or go home with [facility]. She states she will go home with [facility].  NCM already has her set up with [facility] and patient states she would like a nebulizer machine, NCM notified MD of this information.  NCM will contact daughter to inform her of this information as well. NCM informed daughter of this information ,she was appreciative of [place] letting her know. She will transport patient home at discharge and bring her oxygen tank , daughter states she has a doctor's apt at 22 but will check her phone.   Expected Discharge Plan: [place] Barriers to Discharge: Continued Medical Work up  Expected Discharge Plan and Services Expected Discharge Plan: [place] In-house Referral: Clinical Social Work   Post Acute Care Choice: [place] Living arrangements for the past 2 months: [place]                   DME Agency: NA       HH Arranged: Therapist, sports, PT, Nurse's Aide, Social Work [employer] Agency: [place] Date [place]: 01/17/22 Time [place]: [place] Representative spoke with at [place]: [place] Determinants of Health ([place]) Interventions    Readmission Risk Interventions No flowsheet data found.

## 2022-01-26 NOTE — Telephone Encounter (Signed)
Patient admitted to [facility]. Will be seen in consultation for shortness of breath.  Please arrange hospital follow up in two weeks to one month with NP.      Noe Gens, MSN, APRN, NP-C, AGACNP-BC [facility] 01/26/2022, 7:42 AM   Please see [email or web address] for pager details.   From 7A-7P if no response, please call [phone] After hours, please call ELink [phone]

## 2022-01-26 NOTE — Progress Notes (Signed)
Orthostatic VS Lying BP 96/48 pulse 97 Sitting BP 117/68 pulse 92 Standing BP 94/48 pulse 99 Patient unable to stand for 3 minutes for final BP and pulse Edwena Blow, RN

## 2022-01-26 NOTE — Progress Notes (Signed)
[facility] Progress Note   Subjective:    Maybe plans for discharge today -  did not go yesterday as was still c/o SOB-  primary team looking at COPD angle-  was given torsemide yesterday and managed to be one liter negative -  kidney function is basically unchanged -  she now tells me she cannot go to NH-  will need to go home with home health-  this is not great for her   Objective Vitals:   01/25/22 1528 01/25/22 1724 01/26/22 0450 01/26/22 0837  BP: (!) 103/32 129/60 (!) 129/58 (!) 96/48  Pulse: 91 (!) 102 90 99  Resp: 18 20 19  (!) 28  Temp: 97.6 F (36.4 C)  97.6 F (36.4 C) (!) 97.5 F (36.4 C)  TempSrc: Oral  (P) Oral Oral  SpO2: 96% 99% 97% 97%  Weight:      Height:       PHYSICAL EXAM: Gen: in chair looking tired-  need to consider just obesity and deconditioning in the differential here as well Eyes: anicteric ENT: MMM Neck: supple, ^ JVD at 45 degrees CV:  Reg S1, S2, irreg with PVCs on monitor, no rub Abd:  soft obese Lungs: dec BS bases, ^ WOB with conversation GU: purewick with yellow urine in can Extr: Unna wraps to LE, 1+ pitting in thighs Neuro: conversant, nonfocal  Additional Objective Labs: Basic Metabolic Panel: Recent Labs  Lab 01/21/22 0146 01/22/22 0427 01/23/22 0347 01/24/22 0447 01/25/22 0503 01/26/22 0329  NA 141 142   < > 138 138 138  K 3.8 3.4*   < > 3.0* 3.3* 3.1*  CL 101 101   < > 94* 93* 93*  CO2 32 34*   < > 35* 36* 39*  GLUCOSE 195* 219*   < > 136* 137* 181*  BUN 36* 39*   < > 39* 41* 42*  CREATININE 1.98* 1.96*   < > 2.06* 2.15* 2.26*  CALCIUM 8.5* 8.5*   < > 8.8* 8.8* 8.7*  PHOS 3.2 3.3  --   --  3.5  --    < > = values in this interval not displayed.   Liver Function Tests: Recent Labs  Lab 01/22/22 0427 01/25/22 0503 01/26/22 0329  AST  --   --  18  ALT  --   --  15  ALKPHOS  --   --  71  BILITOT  --   --  0.5  PROT  --   --  6.0*  ALBUMIN 2.5* 2.7* 2.6*   No results for input(s): LIPASE,  AMYLASE in the last 168 hours. CBC: Recent Labs  Lab 01/20/22 0328 01/22/22 0427 01/23/22 0347 01/24/22 0447 01/26/22 0329  WBC 10.4 10.7* 10.1 10.6* 11.3*  HGB 8.9* 9.2* 8.6* 8.8* 8.8*  HCT 28.8* 29.4* 28.5* 27.9* 29.1*  MCV 104.7* 104.6* 102.5* 102.2* 102.5*  PLT 341 387 373 372 421*   Blood Culture    Component Value Date/Time   SDES URINE, CLEAN CATCH 12/12/2021 1703   SPECREQUEST NONE 12/12/2021 1703   CULT  12/12/2021 1703    NO GROWTH Performed at [facility] [address], [place], [place] [address]    REPTSTATUS 12/13/2021 FINAL 12/12/2021 1703    Cardiac Enzymes: No results for input(s): CKTOTAL, CKMB, CKMBINDEX, TROPONINI in the last 168 hours. CBG: Recent Labs  Lab 01/25/22 0515 01/25/22 1157 01/25/22 1611 01/25/22 2150 01/26/22 0603  GLUCAP 129* 148* 130* 226* 157*   Iron Studies:  No results for input(s): IRON, TIBC, TRANSFERRIN, FERRITIN in the last 72 hours.  @lablastinr3 @ Studies/Results: CT CHEST WO CONTRAST  Result Date: 01/25/2022 CLINICAL DATA:  Chronic dyspnea. EXAM: CT CHEST WITHOUT CONTRAST TECHNIQUE: Multidetector CT imaging of the chest was performed following the standard protocol without IV contrast. RADIATION DOSE REDUCTION: This exam was performed according to the departmental dose-optimization program which includes automated exposure control, adjustment of the mA and/or kV according to patient size and/or use of iterative reconstruction technique. COMPARISON:  January 19, 2022. FINDINGS: Cardiovascular: Atherosclerosis of thoracic aorta is noted without aneurysm formation. Mild cardiomegaly is noted. No pericardial effusion is noted. Mild coronary artery calcifications are noted. Mediastinum/Nodes: No enlarged mediastinal or axillary lymph nodes. Thyroid gland, trachea, and esophagus demonstrate no significant findings. Lungs/Pleura: No pneumothorax is noted. Small bilateral pleural effusions are noted with adjacent subsegmental  atelectasis. 7 mm subpleural nodule is noted in right upper lobe best seen on image number 77 of series 6. Mild mosaic pattern of both lungs is noted suggesting air trapping secondary to small airways disease or potentially mild multifocal inflammation. Upper Abdomen: No acute abnormality. Musculoskeletal: No chest wall mass or suspicious bone lesions identified. IMPRESSION: Small bilateral pleural effusions are noted with adjacent subsegmental atelectasis. 7 mm subpleural nodule seen in right upper lobe. Non-contrast chest CT at 6-12 months is recommended. If the nodule is stable at time of repeat CT, then future CT at 18-24 months (from today's scan) is considered optional for low-risk patients, but is recommended for high-risk patients. This recommendation follows the consensus statement: Guidelines for Management of Incidental Pulmonary Nodules Detected on CT Images: From the Fleischner Society 2017; Radiology 2017; 284:228-243. Mild mosaic pattern is noted throughout both lungs suggesting air trapping secondary to small airways disease or potentially mild multifocal inflammation. Mild coronary calcifications are noted. Aortic Atherosclerosis (ICD10-I70.0). Electronically Signed   By: Marijo Conception M.D.   On: 01/25/2022 15:06   Medications:  ferric gluconate (FERRLECIT) IVPB 125 mg (01/26/22 0901)    apixaban  2.5 mg Oral BID   darbepoetin (ARANESP) injection - NON-DIALYSIS  60 mcg Subcutaneous Q Tue-1800   diltiazem  30 mg Oral Q6H   gabapentin  100 mg Oral BID   insulin aspart  0-20 Units Subcutaneous TID WC   insulin aspart  10 Units Subcutaneous TID WC   insulin glargine-yfgn  20 Units Subcutaneous QHS   ipratropium-albuterol  3 mL Nebulization Q6H   metoprolol tartrate  25 mg Oral BID   pantoprazole  40 mg Oral BID   potassium chloride  40 mEq Oral BID WC   sertraline  100 mg Oral Daily   sodium chloride flush  3 mL Intravenous Q12H   torsemide  60 mg Oral BID    Assessment/Plan **AKI  on CKD:  longstanding CKD dating back to at least 2016; follows with [facility].  Baseline Cr 2022 was in the 1.2-1.4 range until sepsis admission in 11/2021. Cr more in the 1.5-1.7 range and now during admission for CHF running more in the 2+ range. Suspect cardiorenal main issue here.  Renal US c/w CKD, UA bland.  BP not frankly low but question if AFib impacting renal perfusion to a degree.  She is overloaded.  UOP ^d with now torsemide and metolazone-  did achieve diuresis  but barely.    -- Cr fairly stable at just over 2 today.     - so far have been able to achieve a negative balance -  we  can continue as OP -  would be stable for discharge  it would be my preference to send her out on torsemide at least 60 BID-  given last night with good results -  I will arrange follow up with [facility] and he can continue to titrate diuretics.  If we do see cont rise in crt it is likely just unmasking of what her true renal function is-   she does not appear to be getting too dry   **AoC HFpEF: 11/2021 grade 2 DD.  Quite severe symptoms of volume overload.  She needs more aggressive diuresis. Diuretics per above, follow response.  On low na diet, further fluid restrict to 1247mL/day. Assessing with echo -  did not look terrible    **Anemia:  normal in 11/2021; melena and angiodysplatic colon lesions on colonoscopy s/p therapy.  Hb stable in the 9s now.  have added ESA and  iron stores low-  repleting    **DM:  well controlled typically.  Meds per primary.    **A fib:  rate controlled on low dose dilt and metoprolol.  On eliquis.    **H/o HTN: has been lower since A fib, off amlodipine now.  Hypokalemia-   40 BID repletion  -  suspect will need to continue long term with the big dose diuretics she is on  I am OK with discharge-  I will arrange follow up labs and visit with Dr. Tye Savoy  01/26/2022, 10:42 AM  [facility] Pager: [phone]

## 2022-01-27 ENCOUNTER — Other Ambulatory Visit: Payer: Self-pay | Admitting: Endocrinology

## 2022-01-29 ENCOUNTER — Telehealth: Payer: Self-pay

## 2022-01-29 NOTE — Telephone Encounter (Signed)
Transition Care Management Follow-up Telephone Call Date of discharge and from where: 01/26/2022 -[place] How have you been since you were released from the hospital? Doing pretty good. Feet still swelling some. Any questions or concerns? No  Items Reviewed: Did the pt receive and understand the discharge instructions provided? Yes  Medications obtained and verified? Yes  Other? Yes  Any new allergies since your discharge? No  Dietary orders reviewed? Yes Do you have support at home? Yes   Home Care and Equipment/Supplies: Were home health services ordered? yes If so, what is the name of the agency? [facility]  Has the agency set up a time to come to the patient's home? no Were any new equipment or medical supplies ordered?  Yes: Neb machine What is the name of the medical supply agency? [facility] Were you able to get the supplies/equipment? yes Do you have any questions related to the use of the equipment or supplies? No  Functional Questionnaire: (I = Independent and D = Dependent) ADLs: I with assistance  Bathing/Dressing- I with assistance  Meal Prep- D  Eating- I  Maintaining continence- I with walker  Transferring/Ambulation- I with walker  Managing Meds- I  Follow up appointments reviewed:  PCP Hospital f/u appt confirmed? Yes  Scheduled to see Dr. Etter Sjogren on 02/02/2022 @ 11:20. [facility] f/u appt confirmed? Yes  Scheduled to see Dr. Volanda Napoleon on 02/20/2022 @ 2:00. Are transportation arrangements needed? No  If their condition worsens, is the pt aware to call PCP or go to the Emergency Dept.? Yes Was the patient provided with contact information for the PCP's office or ED? Yes Was to pt encouraged to call back with questions or concerns? Yes

## 2022-01-29 NOTE — Telephone Encounter (Signed)
Nurse Assessment Nurse: Lynnette Caffey, RN, Amber Date/Time (Eastern Time): 01/27/2022 10:48:42 AM Confirm and document reason for call. If symptomatic, describe symptoms. ---Caller states she believes she has a bladder infection. States severe burning and frequency in urination. Denies any fever. Symptoms started about 02:00 this morning. States she just got out of the hospital yesterday for breathing and CHF. Does the patient have any new or worsening symptoms? ---Yes Will a triage be completed? ---Yes Related visit to physician within the last 2 weeks? ---No Does the PT have any chronic conditions? (i.e. diabetes, asthma, this includes High risk factors for pregnancy, etc.) ---Yes List chronic conditions. ---CHF, HTN, COPD, diabetes Is this a behavioral health or substance abuse call? ---No Guidelines Guideline Title Affirmed Question Affirmed Notes Nurse Date/Time (Eastern Time) Urination Pain - Female Diabetes mellitus or weak immune system (e.g., HIV positive, cancer chemo, splenectomy, organ transplant, chronic steroids) Morris, RN, Museum/gallery conservator 01/27/2022 10:52:16 AM PLEASE NOTE: All timestamps contained within this report are represented as [place] Standard Time. CONFIDENTIALTY NOTICE: This fax transmission is intended only for the addressee. It contains information that is legally privileged, confidential or otherwise protected from use or disclosure. If you are not the intended recipient, you are strictly prohibited from reviewing, disclosing, copying using or disseminating any of this information or taking any action in reliance on or regarding this information. If you have received this fax in error, please notify us immediately by telephone so that we can arrange for its return to [place]. Phone: [phone], Toll-Free: [phone], Fax: [phone] Page: 2 of 2 Call Id: [id number] [place]. Time Eilene Ghazi Time) Disposition Final User 01/27/2022 10:58:29 AM See HCP within 4 Hours  (or PCP triage) Yes Morris, RN, Management consultant Understands Yes PreDisposition Call Doctor Care Advice Given Per Guideline SEE HCP (OR PCP TRIAGE) WITHIN 4 HOURS: * IF OFFICE WILL BE CLOSED AND NO PCP (PRIMARY CARE PROVIDER) SECOND-LEVEL TRIAGE: You need to be seen within the next 3 or 4 hours. A nearby Urgent Care Center [facility]) is often a good source of care. Another choice is to go to the ED. Go sooner if you become worse. CALL BACK IF: * You become worse CARE ADVICE given per Urination Pain - Female (Adult) guideline. Referrals GO TO FACILITY UNDECIDED

## 2022-01-30 NOTE — Telephone Encounter (Signed)
Spoke with patient. Pt states she took cranberry pills and states feeling better. Pt advised to call back if sxs return.

## 2022-02-01 ENCOUNTER — Telehealth: Payer: Self-pay | Admitting: Family Medicine

## 2022-02-01 NOTE — Telephone Encounter (Signed)
Caller/Agency: [facility]  Callback Number: [phone] Glenard Haring would like to know if it's okay for the patient to have compression stockings for swelling.  Patient has a stage 2 wound and would like orders for wound care.

## 2022-02-02 ENCOUNTER — Telehealth: Payer: Self-pay | Admitting: *Deleted

## 2022-02-02 ENCOUNTER — Ambulatory Visit: Payer: Medicare Other | Admitting: Family Medicine

## 2022-02-02 ENCOUNTER — Encounter: Payer: Self-pay | Admitting: Family Medicine

## 2022-02-02 VITALS — BP 140/88 | HR 52 | Temp 98.1°F | Resp 20 | Ht 66.0 in

## 2022-02-02 DIAGNOSIS — E1122 Type 2 diabetes mellitus with diabetic chronic kidney disease: Secondary | ICD-10-CM

## 2022-02-02 DIAGNOSIS — I4891 Unspecified atrial fibrillation: Secondary | ICD-10-CM | POA: Diagnosis not present

## 2022-02-02 DIAGNOSIS — N184 Chronic kidney disease, stage 4 (severe): Secondary | ICD-10-CM | POA: Diagnosis not present

## 2022-02-02 DIAGNOSIS — I5031 Acute diastolic (congestive) heart failure: Secondary | ICD-10-CM | POA: Diagnosis not present

## 2022-02-02 DIAGNOSIS — I509 Heart failure, unspecified: Secondary | ICD-10-CM

## 2022-02-02 DIAGNOSIS — N179 Acute kidney failure, unspecified: Secondary | ICD-10-CM

## 2022-02-02 DIAGNOSIS — E785 Hyperlipidemia, unspecified: Secondary | ICD-10-CM

## 2022-02-02 DIAGNOSIS — E1169 Type 2 diabetes mellitus with other specified complication: Secondary | ICD-10-CM

## 2022-02-02 LAB — CBC WITH DIFFERENTIAL/PLATELET
Basophils Absolute: 0.1 10*3/uL (ref 0.0–0.1)
Basophils Relative: 0.6 % (ref 0.0–3.0)
Eosinophils Absolute: 0.3 10*3/uL (ref 0.0–0.7)
Eosinophils Relative: 3 % (ref 0.0–5.0)
HCT: 27.9 % — ABNORMAL LOW (ref 36.0–46.0)
Hemoglobin: 8.8 g/dL — ABNORMAL LOW (ref 12.0–15.0)
Lymphocytes Relative: 13.1 % (ref 12.0–46.0)
Lymphs Abs: 1.3 10*3/uL (ref 0.7–4.0)
MCHC: 31.5 g/dL (ref 30.0–36.0)
MCV: 97.4 fl (ref 78.0–100.0)
Monocytes Absolute: 1.3 10*3/uL — ABNORMAL HIGH (ref 0.1–1.0)
Monocytes Relative: 12.7 % — ABNORMAL HIGH (ref 3.0–12.0)
Neutro Abs: 7.3 10*3/uL (ref 1.4–7.7)
Neutrophils Relative %: 70.6 % (ref 43.0–77.0)
Platelets: 404 10*3/uL — ABNORMAL HIGH (ref 150.0–400.0)
RBC: 2.86 Mil/uL — ABNORMAL LOW (ref 3.87–5.11)
RDW: 17.7 % — ABNORMAL HIGH (ref 11.5–15.5)
WBC: 10.3 10*3/uL (ref 4.0–10.5)

## 2022-02-02 LAB — COMPREHENSIVE METABOLIC PANEL
ALT: 15 U/L (ref 0–35)
AST: 24 U/L (ref 0–37)
Albumin: 3.5 g/dL (ref 3.5–5.2)
Alkaline Phosphatase: 88 U/L (ref 39–117)
BUN: 70 mg/dL — ABNORMAL HIGH (ref 6–23)
CO2: 40 mEq/L — ABNORMAL HIGH (ref 19–32)
Calcium: 9.4 mg/dL (ref 8.4–10.5)
Chloride: 92 mEq/L — ABNORMAL LOW (ref 96–112)
Creatinine, Ser: 2.48 mg/dL — ABNORMAL HIGH (ref 0.40–1.20)
GFR: 17.32 mL/min — ABNORMAL LOW (ref >60.00)
Glucose, Bld: 207 mg/dL — ABNORMAL HIGH (ref 70–99)
Potassium: 3.9 mEq/L (ref 3.5–5.1)
Sodium: 139 mEq/L (ref 135–145)
Total Bilirubin: 0.7 mg/dL (ref 0.2–1.2)
Total Protein: 6.4 g/dL (ref 6.0–8.3)

## 2022-02-02 LAB — LIPID PANEL
Cholesterol: 129 mg/dL (ref 0–200)
HDL: 49.9 mg/dL (ref >39.00)
LDL Cholesterol: 60 mg/dL (ref 0–99)
NonHDL: 79.09
Total CHOL/HDL Ratio: 3
Triglycerides: 95 mg/dL (ref 0.0–149.0)
VLDL: 19 mg/dL (ref 0.0–40.0)

## 2022-02-02 LAB — HEMOGLOBIN A1C: Hgb A1c MFr Bld: 6.2 % (ref 4.6–6.5)

## 2022-02-02 NOTE — Assessment & Plan Note (Signed)
Check labs 

## 2022-02-02 NOTE — Assessment & Plan Note (Signed)
Med list torsemide 60 mg bid ----- pt states she is taking it tid   She will need to double check bottle Urgent referral placed to cardiology

## 2022-02-02 NOTE — Telephone Encounter (Signed)
Spoke with [employer] She advised patient has some slight weeping from her legs. Glenard Haring wanted to know if it was okay for unna boots twice week and then f/u with stocking. Pt also has appt with [place] today. Verbal given.

## 2022-02-02 NOTE — Assessment & Plan Note (Signed)
Encourage heart healthy diet such as MIND or DASH diet, increase exercise, avoid trans fats, simple carbohydrates and processed foods, consider a krill or fish or flaxseed oil cap daily.  °

## 2022-02-02 NOTE — Chronic Care Management (AMB) (Signed)
°  Chronic Care Management   Outreach Note  02/02/2022 Name: Katelyn Mahoney MRN: [id number] DOB: 05/01/1934  [name] is a 88 y.o. year old female who is a primary care patient of Ann Held, DO. I reached out to [name] by phone today in response to a referral sent by Ms. Whitman Hero primary care provider.  An unsuccessful telephone outreach was attempted today. The patient was referred to the case management team for assistance with care management and care coordination.   Follow Up Plan: If patient returns call to provider office, please advise to call Embedded Care Management Care Guide Levetta Bognar at [facility] [place] [facility]  Direct Dial: [phone]

## 2022-02-02 NOTE — Progress Notes (Signed)
Subjective:   By signing my name below, I, Zite Okoli, attest that this documentation has been prepared under the direction and in the presence of Ann Held, DO. 02/02/2022     Patient ID: Katelyn Mahoney, female    DOB: [birth year], 88 y.o.   MRN: [id number]  Chief Complaint  Patient presents with   Hospitalization Follow-up    CHF,       HPI Patient is in today for a hospital follow-up.  She was admitted to the hospital 01/09/2022 after complaining of worsening shortness of breath on exertion and bilateral LLE. This was after treatment of proteus bacteremia secondary to UTI. She was diagnosed with congestive heart failure.  Today, she reports that there is still leg swelling in her two legs. Adds that her feet were back to normal size while in the hospital but there is more swelling now. Left leg is "weeping" and when the leg is palpated, water leaks. She is taking 60 mg torsemide 3 times a day but it is not reducing the swelling. She has upcoming appointments with endocrinology and pulmonology. She also tries to sit with her legs up often.  Past Medical History:  Diagnosis Date   Allergy    Anxiety    Arthritis    Arthritis    Callus    Depression    Diabetes mellitus without complication ([place])    Hyperlipidemia    Hypertension     Past Surgical History:  Procedure Laterality Date   ABDOMINAL HYSTERECTOMY     CHOLECYSTECTOMY     COLONOSCOPY WITH PROPOFOL N/A 01/12/2022   Procedure: COLONOSCOPY WITH PROPOFOL;  Surgeon: Carol Ada, MD;  Location: [facility];  Service: Endoscopy;  Laterality: N/A;   ESOPHAGOGASTRODUODENOSCOPY N/A 01/10/2022   Procedure: ESOPHAGOGASTRODUODENOSCOPY (EGD);  Surgeon: Carol Ada, MD;  Location: [facility];  Service: Endoscopy;  Laterality: N/A;   HEMOSTASIS CLIP PLACEMENT  01/12/2022   Procedure: HEMOSTASIS CLIP PLACEMENT;  Surgeon: Carol Ada, MD;  Location: [facility];  Service: Endoscopy;;   HOT HEMOSTASIS N/A  01/10/2022   Procedure: HOT HEMOSTASIS (ARGON PLASMA COAGULATION/BICAP);  Surgeon: Carol Ada, MD;  Location: [facility];  Service: Endoscopy;  Laterality: N/A;   HOT HEMOSTASIS N/A 01/12/2022   Procedure: HOT HEMOSTASIS (ARGON PLASMA COAGULATION/BICAP);  Surgeon: Carol Ada, MD;  Location: [facility];  Service: Endoscopy;  Laterality: N/A;   SPLENECTOMY      Family History  Problem Relation Age of Onset   Cancer Mother    Diabetes Mother    Cancer Father    Diabetes Father    Cancer Sister    Diabetes Sister    Cancer Brother    Brain cancer Brother    Diabetes Brother    Diabetes Maternal Grandmother    Diabetes Paternal Grandmother    Cancer - Lung Brother    Diabetes Brother     Social History   Socioeconomic History   Marital status: Widowed    Spouse name: Not on file   Number of children: Not on file   Years of education: Not on file   Highest education level: Not on file  Occupational History   Not on file  Tobacco Use   Smoking status: Never   Smokeless tobacco: Never  Substance and Sexual Activity   Alcohol use: No    Alcohol/week: 0.0 standard drinks   Drug use: No   Sexual activity: Never    Birth control/protection: Abstinence  Other Topics Concern   Not on file  Social History Narrative   Not on file   Social Determinants of Health   Financial Resource Strain: Low Risk    Difficulty of Paying Living Expenses: Not hard at all  Food Insecurity: No Food Insecurity   Worried About Charity fundraiser in the Last Year: Never true   Arboriculturist in the Last Year: Never true  Transportation Needs: No Transportation Needs   Lack of Transportation (Medical): No   Lack of Transportation (Non-Medical): No  Physical Activity: Inactive   Days of Exercise per Week: 0 days   Minutes of Exercise per Session: 0 min  Stress: No Stress Concern Present   Feeling of Stress : Not at all  Social Connections: Socially Isolated   Frequency of  Communication with Friends and Family: More than three times a week   Frequency of Social Gatherings with Friends and Family: More than three times a week   Attends Religious Services: Never   Marine scientist or Organizations: No   Attends Archivist Meetings: Never   Marital Status: Widowed  Human resources officer Violence: Not At Risk   Fear of Current or Ex-Partner: No   Emotionally Abused: No   Physically Abused: No   Sexually Abused: No    Outpatient Medications Prior to Visit  Medication Sig Dispense Refill   acetaminophen (TYLENOL) 650 MG CR tablet 650 mg every 8 (eight) hours.     albuterol (VENTOLIN HFA) 108 (90 Base) MCG/ACT inhaler Inhale 2 puffs into the lungs every 6 (six) hours as needed for wheezing or shortness of breath. 8 g 2   atorvastatin (LIPITOR) 20 MG tablet TAKE 1/2 TABLET ON MONDAY, WEDNESDAY, FRIDAY AND      SUNDAYS (Patient taking differently: Take 10 mg by mouth See admin instructions. TAKE 1/2 TABLET ON MONDAY, WEDNESDAY, FRIDAY AND      SUNDAYS) 26 tablet 1   Bilberry, Vaccinium myrtillus, (BILBERRY PO) Take 1 tablet by mouth daily.     fluocinonide cream (LIDEX) 2.83 % Apply 1 application topically daily as needed (breakouts).     gabapentin (NEURONTIN) 100 MG capsule TAKE 1 CAPSULE BY MOUTH 3 TIMES DAILY 90 capsule 3   Glucosamine HCl (GLUCOSAMINE PO) Take 1 tablet by mouth daily.     insulin lispro (HUMALOG KWIKPEN) 100 UNIT/ML KwikPen Inject 6 Units into the skin with breakfast, with lunch, and with evening meal. Inject 7-9 units under the skin three times daily before meals. E11.65 30 mL 0   Insulin Pen Needle 32G X 4 MM MISC Use on insulin pens 4 times a day 400 each 2   ipratropium-albuterol (DUONEB) 0.5-2.5 (3) MG/3ML SOLN Take 3 mLs by nebulization every 6 (six) hours as needed. 360 mL 2   iron polysaccharides (NU-IRON) 150 MG capsule Take 1 capsule (150 mg total) by mouth daily. 30 capsule 2   Loratadine 10 MG CAPS Take 10 mg by mouth  daily.     metoprolol tartrate (LOPRESSOR) 25 MG tablet Take 1 tablet (25 mg total) by mouth 2 (two) times daily. 60 tablet 2   multivitamin-lutein (OCUVITE-LUTEIN) CAPS capsule Take 1 capsule by mouth daily. 90 capsule 3   NONFORMULARY OR COMPOUNDED ITEM Lift chair  #1   Dx osteoarthritis knees 1 each 0   OneTouch Delica Lancets 66Q MISC Use to test blood sugar once daily- E11.65 100 each 3   ONETOUCH VERIO test strip USE TO TEST BLOOD SUGAR    TWICE A DAY 200 strip 3  OZEMPIC, 1 MG/DOSE, 4 MG/3ML SOPN INJECT 1MG  SUBCUTANEOUSLY  WEEKLY (EVERY 7 DAYS) (Patient taking differently: Inject 1 mg into the skin every Saturday.) 9 mL 1   potassium chloride SA (KLOR-CON M) 20 MEQ tablet Take 2 tablets (40 mEq total) by mouth daily. 60 tablet 0   sertraline (ZOLOFT) 100 MG tablet TAKE 1 TABLET DAILY (Patient taking differently: Take 100 mg by mouth daily.) 90 tablet 3   sodium chloride (OCEAN) 0.65 % SOLN nasal spray Place 1 spray into both nostrils as needed for congestion.     Tiotropium Bromide Monohydrate (SPIRIVA RESPIMAT) 2.5 MCG/ACT AERS Inhale 1 puff into the lungs daily. 4 g 2   torsemide 60 MG TABS Take 60 mg by mouth 2 (two) times daily. 60 tablet 2   TOUJEO SOLOSTAR 300 UNIT/ML Solostar Pen INJECT 34 UNITS            SUBCUTANEOUSLY DAILY 13.5 mL 1   apixaban (ELIQUIS) 2.5 MG TABS tablet Take 1 tablet (2.5 mg total) by mouth 2 (two) times daily. 60 tablet 0   diltiazem (CARDIZEM CD) 120 MG 24 hr capsule Take 1 capsule (120 mg total) by mouth daily. 30 capsule 0   pantoprazole (PROTONIX) 40 MG tablet Take 1 tablet (40 mg total) by mouth daily. 30 tablet 0   No facility-administered medications prior to visit.    Allergies  Allergen Reactions   Bupropion Hcl     Palmar rash   Penicillins     rash   Citalopram Hydrobromide Other (See Comments)    cough   Metformin And Related Diarrhea   Zinc Other (See Comments)    unknown ...   Blue Dyes (Parenteral) Rash   Cobalt Rash    Gold-Containing Drug Products Rash   Nickel Rash    Review of Systems  Constitutional:  Negative for fever.  HENT:  Negative for congestion, ear pain, hearing loss, sinus pain and sore throat.   Eyes:  Negative for blurred vision and pain.  Respiratory:  Negative for cough, sputum production, shortness of breath and wheezing.   Cardiovascular:  Positive for leg swelling. Negative for chest pain and palpitations.  Gastrointestinal:  Negative for blood in stool, constipation, diarrhea, nausea and vomiting.  Genitourinary:  Negative for dysuria, frequency, hematuria and urgency.  Musculoskeletal:  Negative for back pain, falls and myalgias.  Neurological:  Negative for dizziness, sensory change, loss of consciousness, weakness and headaches.  Endo/Heme/Allergies:  Negative for environmental allergies. Does not bruise/bleed easily.  Psychiatric/Behavioral:  Negative for depression and suicidal ideas. The patient is not nervous/anxious and does not have insomnia.       Objective:    Physical Exam Vitals and nursing note reviewed.  Constitutional:      General: She is not in acute distress.    Appearance: Normal appearance. She is not ill-appearing.  HENT:     Head: Normocephalic and atraumatic.     Right Ear: External ear normal.     Left Ear: External ear normal.  Eyes:     Extraocular Movements: Extraocular movements intact.     Pupils: Pupils are equal, round, and reactive to light.  Cardiovascular:     Rate and Rhythm: Normal rate and regular rhythm.     Pulses: Normal pulses.     Heart sounds: Normal heart sounds. No murmur heard.   No gallop.     Comments: "Leaking" left leg Pulmonary:     Effort: Pulmonary effort is normal. No respiratory distress.  Breath sounds: Normal breath sounds. No wheezing, rhonchi or rales.  Abdominal:     General: Bowel sounds are normal. There is no distension.     Palpations: Abdomen is soft. There is no mass.     Tenderness: There is no  abdominal tenderness. There is no guarding or rebound.     Hernia: No hernia is present.  Musculoskeletal:     Cervical back: Normal range of motion and neck supple.     Right lower leg: 4+ Pitting Edema present.     Left lower leg: 4+ Pitting Edema present.  Lymphadenopathy:     Cervical: No cervical adenopathy.  Skin:    General: Skin is warm and dry.  Neurological:     Mental Status: She is alert and oriented to person, place, and time.  Psychiatric:        Behavior: Behavior normal.    BP 140/88 (BP Location: Right Arm, Patient Position: Sitting, Cuff Size: Large)    Pulse (!) 52    Temp 98.1 F (36.7 C) (Oral)    Resp 20    Ht 5\' 6"  (1.676 m)    LMP  (LMP Unknown)    SpO2 98%    BMI 48.14 kg/m  Wt Readings from Last 3 Encounters:  01/25/22 298 lb 4.5 oz (135.3 kg)  12/25/21 293 lb 14 oz (133.3 kg)  11/16/21 288 lb 9.6 oz (130.9 kg)    Diabetic Foot Exam - Simple   No data filed    Lab Results  Component Value Date   WBC 11.3 (H) 01/26/2022   HGB 8.8 (L) 01/26/2022   HCT 29.1 (L) 01/26/2022   PLT 421 (H) 01/26/2022   GLUCOSE 181 (H) 01/26/2022   CHOL 161 09/11/2021   TRIG 130.0 09/11/2021   HDL 66.80 09/11/2021   LDLDIRECT 78.3 04/16/2013   LDLCALC 68 09/11/2021   ALT 15 01/26/2022   AST 18 01/26/2022   NA 138 01/26/2022   K 3.1 (L) 01/26/2022   CL 93 (L) 01/26/2022   CREATININE 2.26 (H) 01/26/2022   BUN 42 (H) 01/26/2022   CO2 39 (H) 01/26/2022   TSH 0.959 12/26/2021   HGBA1C 7.4 (H) 11/13/2021   MICROALBUR 5.4 (H) 01/22/2022    Lab Results  Component Value Date   TSH 0.959 12/26/2021   Lab Results  Component Value Date   WBC 11.3 (H) 01/26/2022   HGB 8.8 (L) 01/26/2022   HCT 29.1 (L) 01/26/2022   MCV 102.5 (H) 01/26/2022   PLT 421 (H) 01/26/2022   Lab Results  Component Value Date   NA 138 01/26/2022   K 3.1 (L) 01/26/2022   CO2 39 (H) 01/26/2022   GLUCOSE 181 (H) 01/26/2022   BUN 42 (H) 01/26/2022   CREATININE 2.26 (H) 01/26/2022    BILITOT 0.5 01/26/2022   ALKPHOS 71 01/26/2022   AST 18 01/26/2022   ALT 15 01/26/2022   PROT 6.0 (L) 01/26/2022   ALBUMIN 2.6 (L) 01/26/2022   CALCIUM 8.7 (L) 01/26/2022   ANIONGAP 6 01/26/2022   GFR 35.98 (L) 11/13/2021   Lab Results  Component Value Date   CHOL 161 09/11/2021   Lab Results  Component Value Date   HDL 66.80 09/11/2021   Lab Results  Component Value Date   LDLCALC 68 09/11/2021   Lab Results  Component Value Date   TRIG 130.0 09/11/2021   Lab Results  Component Value Date   CHOLHDL 2 09/11/2021   Lab Results  Component Value Date  HGBA1C 7.4 (H) 11/13/2021       Assessment & Plan:   Problem List Items Addressed This Visit       Unprioritized   Diabetes mellitus ([place])   Relevant Orders   Hemoglobin A1c   Lipid panel   Acute exacerbation of CHF (congestive heart failure) ([place]) - Primary   Acute congestive heart failure ([place])    Med list torsemide 60 mg bid ----- pt states she is taking it tid   She will need to double check bottle Urgent referral placed to cardiology      Relevant Orders   Comprehensive metabolic panel   CBC with Differential/Platelet   AMB Referral to Community Care Coordinaton   AKI (acute kidney injury) ([place])    Check labs       Atrial fibrillation with RVR ([place])    Refer to cardiology      Hyperlipidemia    Encourage heart healthy diet such as MIND or DASH diet, increase exercise, avoid trans fats, simple carbohydrates and processed foods, consider a krill or fish or flaxseed oil cap daily.       Hyperlipidemia associated with type 2 diabetes mellitus ([place])    Encourage heart healthy diet such as MIND or DASH diet, increase exercise, avoid trans fats, simple carbohydrates and processed foods, consider a krill or fish or flaxseed oil cap daily.       Other Visit Diagnoses     Atrial fibrillation, unspecified type [facility])       Relevant Orders   Ambulatory referral to Cardiology        No orders of  the defined types were placed in this encounter.   I,Zite Okoli,acting as a Education administrator for [employer], DO.,have documented all relevant documentation on the behalf of Ann Held, DO,as directed by  Ann Held, DO while in the presence of Ann Held, [place], DO., personally preformed the services described in this documentation.  All medical record entries made by the scribe were at my direction and in my presence.  I have reviewed the chart and discharge instructions (if applicable) and agree that the record reflects my personal performance and is accurate and complete. 02/02/2022

## 2022-02-02 NOTE — Patient Instructions (Signed)
Heart Failure, Diagnosis Heart failure is a condition in which the heart has trouble pumping blood. This may mean that the heart cannot pump enough blood out to the body or that the heart does not fill up with enough blood. For some people with heart failure, fluid may back up into the lungs. There may also be swelling (edema) in the lower legs. Heart failure is usually a long-term (chronic) condition. It is important for you to take good care of yourself and follow the treatment plan from your health care provider. Different stages of heart failure have different treatment plans. The stages are: Stage A: At risk for heart failure. Having no symptoms of heart failure, but being at risk for developing heart failure. Stage B: Pre-heart failure. Having no symptoms of heart failure, but having structural changes to the heart that indicate heart failure. Stage C: Symptomatic heart failure. Having symptoms of heart failure in addition to structural changes to the heart that indicate heart failure. Stage D: Advanced heart failure. Having symptoms that interfere with daily life and frequent hospitalizations related to heart failure. What are the causes? This condition may be caused by: High blood pressure (hypertension). Hypertension causes the heart muscle to work harder than normal. Coronary artery disease, or CAD. CAD is the buildup of cholesterol and fat (plaque) in the arteries of the heart. Heart attack, also called myocardial infarction. This injures the heart muscle, making it hard for the heart to pump blood. Abnormal heart valves. The valves do not open and close properly, forcing the heart to pump harder to keep the blood flowing. Heart muscle disease, inflammation, or infection (cardiomyopathy or myocarditis). This is damage to the heart muscle. It can increase the risk of heart failure. Lung disease. The heart works harder when the lungs are not healthy. What increases the risk? The risk of  heart failure increases as a person ages. This condition is also more likely to develop in people who: Are obese. Use tobacco or nicotine products. Abuse alcohol or drugs. Have taken medicines that can damage the heart, such as chemotherapy drugs. Have any of these conditions: Diabetes. Abnormal heart rhythms. Thyroid problems. Low blood counts (anemia). Chronic kidney disease. Have a family history of heart failure. What are the signs or symptoms? Symptoms of this condition include: Shortness of breath with activity, such as when climbing stairs. A cough that does not go away. Swelling of the feet, ankles, legs, or abdomen. Losing or gaining weight for no reason. Trouble breathing when lying flat. Waking from sleep because of the need to sit up and get more air. Rapid heartbeat. Other symptoms may include: Tiredness (fatigue) and loss of energy. Feeling light-headed, dizzy, or close to fainting. Nausea or loss of appetite. Waking up more often during the night to urinate (nocturia). Confusion. How is this diagnosed? This condition is diagnosed based on: Your medical history, symptoms, and a physical exam. Blood tests. Diagnostic tests, which may include: Echocardiogram. Electrocardiogram (ECG). Chest X-ray. Exercise stress test. Cardiac MRI. Cardiac catheterization and angiogram. Radionuclide scans. How is this treated? Treatment for this condition is aimed at managing the symptoms of heart failure. Medicines Treatment may include medicines that: Help lower blood pressure by relaxing (dilating) the blood vessels. These medicines are called ACE inhibitors (angiotensin-converting enzyme), ARBs (angiotensin receptor blockers), or vasodilators. Cause the kidneys to remove salt and water from the blood through urination (diuretics). Improve heart muscle strength and prevent the heart from beating too fast (beta blockers). Increase the force  of the heartbeat (digoxin). Lower  heart rates. Certain diabetes medicines (SGLT-2 inhibitors) may also be used in treatment. Healthy behavior changes Treatment may also include making healthy lifestyle changes, such as: Reaching and staying at a healthy weight. Not using tobacco or nicotine products. Eating heart-healthy foods. Limiting or avoiding alcohol. Stopping the use of illegal drugs. Being physically active. Participating in a cardiac rehabilitation program, which is a treatment program to improve your health and well-being through exercise training, education, and counseling. Other treatments Other treatments may include: Procedures to open blocked arteries or repair damaged valves. Placing a pacemaker to improve heart function (cardiac resynchronization therapy). Placing a device to treat serious abnormal heart rhythms (implantable cardioverter defibrillator, or ICD). Placing a device to improve the pumping ability of the heart (left ventricular assist device, or LVAD). Receiving a healthy heart from a donor (heart transplant). This is done when other treatments have not helped. Follow these instructions at home: Manage other health conditions as told by your health care provider. These may include hypertension, diabetes, thyroid disease, or abnormal heart rhythms. Get ongoing education and support as needed. Learn as much as you can about heart failure. Keep all follow-up visits. This is important. Where to find more information American Heart Association: www.heart.org Centers for Disease Control and Prevention: http://www.wolf.info/ NIH National Institute on Aging: http://kim-miller.com/ Summary Heart failure is a condition in which the heart has trouble pumping blood. This condition is commonly caused by high blood pressure and other diseases of the heart and lungs. Symptoms of this condition include shortness of breath, tiredness (fatigue), nausea, and swelling of the feet, ankles, legs, or abdomen. Treatments for this  condition may include medicines, lifestyle changes, and surgery. Manage other health conditions as told by your health care provider. This information is not intended to replace advice given to you by your health care provider. Make sure you discuss any questions you have with your health care provider. Document Revised: 06/15/2021 Document Reviewed: 07/09/2020 Elsevier Patient Education  [place].

## 2022-02-02 NOTE — Addendum Note (Signed)
Addended by: Roma Schanz R on: 02/02/2022 12:41 PM   Modules accepted: Orders

## 2022-02-02 NOTE — Assessment & Plan Note (Signed)
Refer to cardiology 

## 2022-02-05 ENCOUNTER — Encounter: Payer: Self-pay | Admitting: Endocrinology

## 2022-02-05 ENCOUNTER — Telehealth: Payer: Self-pay

## 2022-02-05 NOTE — Telephone Encounter (Signed)
° °  Telephone encounter was:  Successful.  02/05/2022 Name: Katelyn Mahoney MRN: [id number] DOB: 17-Dec-1934  Katelyn Mahoney is a 88 y.o. year old female who is a primary care patient of Ann Held, DO . The community resource team was consulted for assistance with Transportation Needs   Care guide performed the following interventions: Patient provided with information about care guide support team and interviewed to confirm resource needs. Referral for transportation has been put in  Follow Up Plan:  No further follow up planned at this time. The patient has been provided with needed resources.    [place], Care Management  [phone] [address], [place], [place] [address] Phone: [phone] Email: [email or web address]

## 2022-02-06 ENCOUNTER — Telehealth: Payer: Self-pay | Admitting: Family Medicine

## 2022-02-06 NOTE — Chronic Care Management (AMB) (Signed)
Chronic Care Management   Note  02/06/2022 Name: Katelyn Mahoney MRN: [id number] DOB: March 01, 1934  Katelyn Mahoney is a 88 y.o. year old female who is a primary care patient of Ann Held, DO. I reached out to [name] by phone today in response to a referral sent by Ms. Whitman Hero PCP.  Ms. Level was given information about Chronic Care Management services today including:  CCM service includes personalized support from designated clinical staff supervised by her physician, including individualized plan of care and coordination with other care providers 24/7 contact phone numbers for assistance for urgent and routine care needs. Service will only be billed when office clinical staff spend 20 minutes or more in a month to coordinate care. Only one practitioner may furnish and bill the service in a calendar month. The patient may stop CCM services at any time (effective at the end of the month) by phone call to the office staff. The patient is responsible for co-pay (up to 20% after annual deductible is met) if co-pay is required by the individual health plan.   Patient agreed to services and verbal consent obtained.   Follow up plan: Telephone appointment with care management team member scheduled for: 02/12/2022  Julian Hy, [place] Management  Direct Dial: [phone]

## 2022-02-06 NOTE — Telephone Encounter (Signed)
Medication:  diltiazem (CARDIZEM CD) 120 MG 24 hr capsule [[id number]] apixaban (ELIQUIS) 2.5 MG TABS tablet [[id number]] pantoprazole (PROTONIX) 40 MG tablet [[id number]]   Has the patient contacted their pharmacy? No. (If no, request that the patient contact the pharmacy for the refill.) (If yes, when and what did the pharmacy advise?)     Preferred Pharmacy (with phone number or street name):  [facility] - [address] [place], [place] [address]  Phone:  [phone]  Fax:  [phone]     Agent: Please be advised that RX refills may take up to 3 business days. We ask that you follow-up with your pharmacy.

## 2022-02-07 NOTE — Telephone Encounter (Signed)
Patient request Cardizem, Eliquis, Protonix Eliquis was started recently with new onset of atrial fibrillation. Then pt was admitted  with melena, GI recommended to continue Protonix. Had a EGD and a colonoscopy, colon AVMs were ablated. Was recommended to hold Eliquis until 01/12/2022. Currently Eliquis not on her medication list. Plan: - Okay to refill Protonix, 1 month supply. -Reach out to cardiology for Eliquis and Cardizem Refills.  (When she saw Dr. Etter Sjogren 02/07/2022, was not on Cardizem, heart rate 84). - If she does not have a cardiologist  needs a referral.

## 2022-02-07 NOTE — Telephone Encounter (Signed)
Patient would like an update on her medication refill. Please advise.

## 2022-02-07 NOTE — Telephone Encounter (Signed)
Medications never written by Katelyn Mahoney before.  These were prescribed when she was in the hospital in December.  I just would like to get an ok to fill?  She also saw [facility] in February.

## 2022-02-07 NOTE — Telephone Encounter (Signed)
Patient contacted office again regarding medication refills.

## 2022-02-08 ENCOUNTER — Telehealth: Payer: Self-pay

## 2022-02-08 ENCOUNTER — Ambulatory Visit: Payer: Medicare Other | Admitting: Cardiology

## 2022-02-08 ENCOUNTER — Encounter: Payer: Self-pay | Admitting: Cardiology

## 2022-02-08 ENCOUNTER — Other Ambulatory Visit: Payer: Self-pay

## 2022-02-08 VITALS — BP 126/60 | HR 91 | Resp 16 | Ht 66.0 in | Wt 297.8 lb

## 2022-02-08 DIAGNOSIS — I4819 Other persistent atrial fibrillation: Secondary | ICD-10-CM

## 2022-02-08 DIAGNOSIS — J9621 Acute and chronic respiratory failure with hypoxia: Secondary | ICD-10-CM | POA: Insufficient documentation

## 2022-02-08 DIAGNOSIS — I5033 Acute on chronic diastolic (congestive) heart failure: Secondary | ICD-10-CM

## 2022-02-08 MED ORDER — APIXABAN 2.5 MG PO TABS: 2.5000 mg | ORAL_TABLET | Freq: Two times a day (BID) | ORAL | 3 refills | Status: AC

## 2022-02-08 MED ORDER — DILTIAZEM HCL ER COATED BEADS 120 MG PO CP24: 120.0000 mg | ORAL_CAPSULE | Freq: Every day | ORAL | 3 refills | Status: AC

## 2022-02-08 MED ORDER — PANTOPRAZOLE SODIUM 40 MG PO TBEC: 40.0000 mg | DELAYED_RELEASE_TABLET | Freq: Every day | ORAL | 0 refills | Status: AC

## 2022-02-08 NOTE — Telephone Encounter (Signed)
Patient has an appointment today with cardiology and she will ask about the Eliquis and Cardizem.  She would like Protonix sent into mailorder.  Rx sent.

## 2022-02-08 NOTE — Telephone Encounter (Signed)
° °  Telephone encounter was:  Unsuccessful.  02/08/2022 Name: Katelyn Mahoney MRN: [id number] DOB: 05-14-1934  Unsuccessful outbound call made today to assist with:  Transportation Needs   mailing transportation [place] application and [place], [place], Care Management  [phone] [address], [place], [place] [address] Phone: [phone] Email: [email or web address]

## 2022-02-08 NOTE — Telephone Encounter (Signed)
° °  Telephone encounter was:  Unsuccessful.  02/08/2022 Name: Katelyn Mahoney MRN: [id number] DOB: Aug 20, 1934  Unsuccessful outbound call made today to assist with:  Transportation Needs   Outreach Attempt:    A HIPAA compliant voice message was left requesting a return call.  Instructed patient to call back at earliest  convenience.     [place], Care Management  [phone] [address], [place], [place] [address] Phone: [phone] Email: [email or web address]

## 2022-02-08 NOTE — Progress Notes (Signed)
Follow up visit  Subjective:   Katelyn Mahoney, female    DOB: January 07, 1934, 88 y.o.   MRN: [id number]    HPI   Chief Complaint  Patient presents with   Atrial Fibrillation   Congestive Heart Failure   New Patient (Initial Visit)   88 year old Caucasian female with hypertension, type II Mellitus, obesity, persistent A-fib diagnosed 11/2021 during hospitalization for acute hypoxic respiratory failure due to pneumonia and HFpEF, possible, subsequent AKI, acute heart failure exacerbation.  Patient is here today with her niece. Patient is now dependent on supplemental oxygen. She feels tightness in her chest if her oxygen sats drop below 80%. Patient has had severe deterioration of her renal function in the last few weeks, details below. She has also developed massive generalized swelling. She things she is not responding to diuretics as well as she did before. She is currently on torsemide 60 mg bid. She is currently not on metolazone as before. Overall, her functional capacity is severely decreased. Her daughter and son-in-law are staying with her. She also has home health.   Current Outpatient Medications on File Prior to Visit  Medication Sig Dispense Refill   acetaminophen (TYLENOL) 650 MG CR tablet 650 mg every 8 (eight) hours.     albuterol (VENTOLIN HFA) 108 (90 Base) MCG/ACT inhaler Inhale 2 puffs into the lungs every 6 (six) hours as needed for wheezing or shortness of breath. 8 g 2   apixaban (ELIQUIS) 2.5 MG TABS tablet Take 1 tablet (2.5 mg total) by mouth 2 (two) times daily. 60 tablet 0   atorvastatin (LIPITOR) 20 MG tablet TAKE 1/2 TABLET ON MONDAY, WEDNESDAY, FRIDAY AND      SUNDAYS (Patient taking differently: Take 10 mg by mouth See admin instructions. TAKE 1/2 TABLET ON MONDAY, WEDNESDAY, FRIDAY AND      SUNDAYS) 26 tablet 1   Bilberry, Vaccinium myrtillus, (BILBERRY PO) Take 1 tablet by mouth daily.     diltiazem (CARDIZEM CD) 120 MG 24 hr capsule Take 1 capsule (120 mg  total) by mouth daily. 30 capsule 0   fluocinonide cream (LIDEX) 5.64 % Apply 1 application topically daily as needed (breakouts).     gabapentin (NEURONTIN) 100 MG capsule TAKE 1 CAPSULE BY MOUTH 3 TIMES DAILY 90 capsule 3   Glucosamine HCl (GLUCOSAMINE PO) Take 1 tablet by mouth daily.     insulin lispro (HUMALOG KWIKPEN) 100 UNIT/ML KwikPen Inject 6 Units into the skin with breakfast, with lunch, and with evening meal. Inject 7-9 units under the skin three times daily before meals. E11.65 30 mL 0   Insulin Pen Needle 32G X 4 MM MISC Use on insulin pens 4 times a day 400 each 2   ipratropium-albuterol (DUONEB) 0.5-2.5 (3) MG/3ML SOLN Take 3 mLs by nebulization every 6 (six) hours as needed. 360 mL 2   iron polysaccharides (NU-IRON) 150 MG capsule Take 1 capsule (150 mg total) by mouth daily. 30 capsule 2   Loratadine 10 MG CAPS Take 10 mg by mouth daily.     metoprolol tartrate (LOPRESSOR) 25 MG tablet Take 1 tablet (25 mg total) by mouth 2 (two) times daily. 60 tablet 2   multivitamin-lutein (OCUVITE-LUTEIN) CAPS capsule Take 1 capsule by mouth daily. 90 capsule 3   NONFORMULARY OR COMPOUNDED ITEM Lift chair  #1   Dx osteoarthritis knees 1 each 0   OneTouch Delica Lancets 33I MISC Use to test blood sugar once daily- E11.65 100 each 3   ONETOUCH VERIO test strip  USE TO TEST BLOOD SUGAR    TWICE A DAY 200 strip 3   OZEMPIC, 1 MG/DOSE, 4 MG/3ML SOPN INJECT 1MG SUBCUTANEOUSLY  WEEKLY (EVERY 7 DAYS) (Patient taking differently: Inject 1 mg into the skin every Saturday.) 9 mL 1   pantoprazole (PROTONIX) 40 MG tablet Take 1 tablet (40 mg total) by mouth daily. 90 tablet 0   potassium chloride SA (KLOR-CON M) 20 MEQ tablet Take 2 tablets (40 mEq total) by mouth daily. 60 tablet 0   sertraline (ZOLOFT) 100 MG tablet TAKE 1 TABLET DAILY (Patient taking differently: Take 100 mg by mouth daily.) 90 tablet 3   sodium chloride (OCEAN) 0.65 % SOLN nasal spray Place 1 spray into both nostrils as needed for  congestion.     Tiotropium Bromide Monohydrate (SPIRIVA RESPIMAT) 2.5 MCG/ACT AERS Inhale 1 puff into the lungs daily. 4 g 2   torsemide 60 MG TABS Take 60 mg by mouth 2 (two) times daily. 60 tablet 2   TOUJEO SOLOSTAR 300 UNIT/ML Solostar Pen INJECT 34 UNITS            SUBCUTANEOUSLY DAILY 13.5 mL 1   No current facility-administered medications on file prior to visit.    Cardiovascular & other pertient studies:  EKG 02/08/2022: Atrial fibrillation 114 bpm Frequent PVCs  Nonspecific T-abnormality  Low voltage  Echocardiogram 01/22/2022:  1. ? left pleural effusion.   2. Left ventricular ejection fraction, by estimation, is 50 to 55%. The  left ventricle has low normal function. The left ventricle has no regional  wall motion abnormalities. Left ventricular diastolic parameters are  indeterminate.   3. Right ventricular systolic function is mildly reduced. The right  ventricular size is mildly enlarged.   4. Left atrial size was moderately dilated.   5. Right atrial size was moderately dilated.   6. The mitral valve is abnormal. Moderate mitral valve regurgitation. No  evidence of mitral stenosis.   7. Tricuspid valve regurgitation is moderate.   8. The aortic valve is tricuspid. There is moderate calcification of the  aortic valve. There is moderate thickening of the aortic valve. Aortic  valve regurgitation is trivial. Mild aortic valve stenosis.   9. The inferior vena cava is dilated in size with >50% respiratory  variability, suggesting right atrial pressure of 8 mmHg.   Echocardiogram 12/13/2021:  1. Left ventricular ejection fraction, by estimation, is 50 to 55%. The  left ventricle has low normal function. The left ventricle has no regional  wall motion abnormalities. Left ventricular diastolic parameters are  consistent with Grade II diastolic  dysfunction (pseudonormalization).   2. Right ventricular systolic function is normal. The right ventricular  size is normal.  There is moderately elevated pulmonary artery systolic  pressure. The estimated right ventricular systolic pressure is 03.2 mmHg.   3. A small pericardial effusion is present. The pericardial effusion is  circumferential.   4. The mitral valve is degenerative. Moderate mitral valve regurgitation.  No evidence of mitral stenosis.   5. The aortic valve is tricuspid. There is mild calcification of the  aortic valve. There is mild thickening of the aortic valve. Aortic valve  regurgitation is trivial. Aortic valve sclerosis/calcification is present,  without any evidence of aortic  stenosis.   6. The inferior vena cava is dilated in size with <50% respiratory  variability, suggesting right atrial pressure of 15 mmHg.   CT Chest 01/25/2022: Small bilateral pleural effusions are noted with adjacent subsegmental atelectasis.   7 mm subpleural  nodule seen in right upper lobe. Non-contrast chest CT at 6-12 months is recommended. If the nodule is stable at time of repeat CT, then future CT at 18-24 months (from today's scan) is considered optional for low-risk patients, but is recommended for high-risk patients. This recommendation follows the consensus statement: Guidelines for Management of Incidental Pulmonary Nodules Detected on CT Images: From the Fleischner Society 2017; Radiology 2017; 284:228-243.   Mild mosaic pattern is noted throughout both lungs suggesting air trapping secondary to small airways disease or potentially mild multifocal inflammation.   Mild coronary calcifications are noted.   Aortic Atherosclerosis (ICD10-I70.0).    CXR 01/19/2022: Mild vascular congestion without edema Improvement in bibasilar airspace disease.  Improved lung volumes.     Recent labs: 02/02/2022: Glucose 207, BUN/Cr 70/2.48. EGFR 17. Na/K 139/3.9. Rest of the CMP normal H/H 8.8/27.9. MCV 97. Platelets 404 Chol 129, TG 95, HDL 49, LDL 60   Review of Systems  Constitutional:       In  wheelchair  Cardiovascular:  Positive for dyspnea on exertion and leg swelling (As well as generalized swelling). Negative for chest pain, palpitations and syncope.       On supplemental oxygen   Respiratory:  Positive for shortness of breath.         Vitals:   02/08/22 1420  BP: 126/60  Pulse: 91  Resp: 16  SpO2: 99%    Body mass index is 46.16 kg/m. Filed Weights   02/08/22 1420  Weight: 286 lb (129.7 kg)     Objective:   Physical Exam Vitals and nursing note reviewed.  Constitutional:      General: She is not in acute distress.    Appearance: She is obese.  Neck:     Vascular: No JVD.  Cardiovascular:     Rate and Rhythm: Tachycardia present. Rhythm irregularly irregular.     Heart sounds: Normal heart sounds. No murmur heard.    Comments: Anasarca Pulmonary:     Effort: Pulmonary effort is normal.     Breath sounds: Normal breath sounds. No wheezing or rales.          Assessment & Recommendations:   88 year old Caucasian female with hypertension, type II Mellitus, obesity, persistent A-fib diagnosed 11/2021 during hospitalization for acute hypoxic respiratory failure due to pneumonia and HFpEF, possible, subsequent AKI, acute heart failure exacerbation.  1. Acute on chronic respiratory failure with hypoxia (HCC) Multifactorial. Progression of chronic respiratory failure Etiologies include HFpEF, obesity, deconditioning, worsening AKI Possibility of underlying obstructive airway disease She is currently on supplemental oxygen. Continue the same. See below re: diuresis  2. Acute on chronic heart failure with preserved ejection fraction (HCC) Likely precipitated by Afib, and now worsening renal function Aggressive diuresis limited by worsening renal function.  Currently on torsemide 60 mg bid. Not on metolazone, probably due to increasing Cr I do wonder if her Afib is further decompensating her heart failure, see below  3. Persistent atrial fibrillation  (HCC) Rate suboptimally controlled on diltiazem 120 mg daily, metoprolol tartrate 25 mg bid. Given worsening HFpEF and renal failure, I recommend cardioversion and restoring sinus rhythm. Whether she will maintain sinus rhythm or not remains to be seen. Her respiratory failure does increase her risk of peri-procedural complications. However, I do not think there is better alternative. Risks, benefits discussed with the patient niece in detail.  CHA2DS2VASc score high.  Continue Eliquis 2.5 mg twice daily.  Fortunately, she is tolerating eliquis without any major bleeding issues. EGD  performed in 12/2021 for heme positive stools only showed gastritis.      F/u after cardioversion   Nigel Mormon, MD Pager: [phone] Office: [phone]

## 2022-02-09 ENCOUNTER — Telehealth: Payer: Self-pay | Admitting: Family Medicine

## 2022-02-09 ENCOUNTER — Ambulatory Visit (INDEPENDENT_AMBULATORY_CARE_PROVIDER_SITE_OTHER): Payer: BLUE CROSS/BLUE SHIELD | Admitting: *Deleted

## 2022-02-09 ENCOUNTER — Telehealth: Payer: Self-pay

## 2022-02-09 DIAGNOSIS — F324 Major depressive disorder, single episode, in partial remission: Secondary | ICD-10-CM

## 2022-02-09 DIAGNOSIS — N1832 Chronic kidney disease, stage 3b: Secondary | ICD-10-CM

## 2022-02-09 DIAGNOSIS — I4891 Unspecified atrial fibrillation: Secondary | ICD-10-CM

## 2022-02-09 DIAGNOSIS — E1169 Type 2 diabetes mellitus with other specified complication: Secondary | ICD-10-CM

## 2022-02-09 DIAGNOSIS — N179 Acute kidney failure, unspecified: Secondary | ICD-10-CM

## 2022-02-09 DIAGNOSIS — E785 Hyperlipidemia, unspecified: Secondary | ICD-10-CM

## 2022-02-09 DIAGNOSIS — I509 Heart failure, unspecified: Secondary | ICD-10-CM

## 2022-02-09 DIAGNOSIS — N184 Chronic kidney disease, stage 4 (severe): Secondary | ICD-10-CM

## 2022-02-09 DIAGNOSIS — I5031 Acute diastolic (congestive) heart failure: Secondary | ICD-10-CM

## 2022-02-09 DIAGNOSIS — F3289 Other specified depressive episodes: Secondary | ICD-10-CM

## 2022-02-09 DIAGNOSIS — R600 Localized edema: Secondary | ICD-10-CM

## 2022-02-09 NOTE — Telephone Encounter (Signed)
Glenard Haring wanted to make Dr. Etter Sjogren aware that pt's heart rate has been irregular going from 120 to 70-66, she is currently on AFib. RN stated she just saw cardiology yesterday and she's going to have a cardioverted on March 8th. She has shortness of breath and feeling fatigued but she was currently doing a nebulizer treatment to help the shortness of breat. RN also stated that pt moved from recliner to chair (about 10 feet) and that caused her to be sob and have a little bit of chest pain but it subsided. CBG was checked and it was 196. She advised pt to monitor symptoms, and in case of chest pain and an increase of sob to go to the hospital. She would like to know if there's anything else she should do. Please advise.    Engineer, materials, from [facility]: [phone]

## 2022-02-09 NOTE — Telephone Encounter (Signed)
° °  Telephone encounter was:  Successful.  02/09/2022 Name: Katelyn Mahoney MRN: [id number] DOB: August 09, 1934  Katelyn Mahoney is a 88 y.o. year old female who is a primary care patient of Ann Held, DO . The community resource team was consulted for assistance with Transportation Needs   Care guide performed the following interventions: Patient provided with information about care guide support team and interviewed to confirm resource needs.Followed up with patient about her transportation set up for the 21st and that I put [employer] Application in the mail for her to fillout for resurces next time she has transportation needs  Follow Up Plan:  No further follow up planned at this time. The patient has been provided with needed resources.    [place], Care Management  [phone] [address], [place], [place] [address] Phone: [phone] Email: [email or web address]

## 2022-02-09 NOTE — Telephone Encounter (Signed)
Called and let a VM on [facility] machine that we do not have anything else to add at this point per Jodi Mourning (since she was right beside me). Agree to ER should sxs worsen or change. Continue to see Cardiology for care.

## 2022-02-10 ENCOUNTER — Other Ambulatory Visit: Payer: Self-pay | Admitting: Family Medicine

## 2022-02-10 ENCOUNTER — Other Ambulatory Visit: Payer: Self-pay | Admitting: Endocrinology

## 2022-02-10 DIAGNOSIS — I10 Essential (primary) hypertension: Secondary | ICD-10-CM

## 2022-02-10 DIAGNOSIS — E1121 Type 2 diabetes mellitus with diabetic nephropathy: Secondary | ICD-10-CM

## 2022-02-10 DIAGNOSIS — E785 Hyperlipidemia, unspecified: Secondary | ICD-10-CM

## 2022-02-12 ENCOUNTER — Telehealth: Payer: Medicare Other

## 2022-02-12 ENCOUNTER — Emergency Department (HOSPITAL_COMMUNITY): Payer: Medicare Other

## 2022-02-12 ENCOUNTER — Telehealth: Payer: Self-pay

## 2022-02-12 ENCOUNTER — Inpatient Hospital Stay (HOSPITAL_COMMUNITY)
Admission: EM | Admit: 2022-02-12 | Discharge: 2022-03-02 | DRG: 291 | Disposition: A | Discharge status: Hospice | Payer: Medicare Other | Attending: Internal Medicine | Admitting: Internal Medicine

## 2022-02-12 DIAGNOSIS — I480 Paroxysmal atrial fibrillation: Secondary | ICD-10-CM

## 2022-02-12 DIAGNOSIS — J9621 Acute and chronic respiratory failure with hypoxia: Secondary | ICD-10-CM | POA: Diagnosis present

## 2022-02-12 DIAGNOSIS — E1165 Type 2 diabetes mellitus with hyperglycemia: Secondary | ICD-10-CM | POA: Diagnosis present

## 2022-02-12 DIAGNOSIS — I5082 Biventricular heart failure: Secondary | ICD-10-CM | POA: Diagnosis present

## 2022-02-12 DIAGNOSIS — Z9981 Dependence on supplemental oxygen: Secondary | ICD-10-CM

## 2022-02-12 DIAGNOSIS — L97329 Non-pressure chronic ulcer of left ankle with unspecified severity: Secondary | ICD-10-CM | POA: Diagnosis present

## 2022-02-12 DIAGNOSIS — E785 Hyperlipidemia, unspecified: Secondary | ICD-10-CM | POA: Diagnosis present

## 2022-02-12 DIAGNOSIS — E876 Hypokalemia: Secondary | ICD-10-CM | POA: Diagnosis not present

## 2022-02-12 DIAGNOSIS — Z7901 Long term (current) use of anticoagulants: Secondary | ICD-10-CM

## 2022-02-12 DIAGNOSIS — Z79899 Other long term (current) drug therapy: Secondary | ICD-10-CM

## 2022-02-12 DIAGNOSIS — E1122 Type 2 diabetes mellitus with diabetic chronic kidney disease: Secondary | ICD-10-CM | POA: Diagnosis present

## 2022-02-12 DIAGNOSIS — I5033 Acute on chronic diastolic (congestive) heart failure: Secondary | ICD-10-CM | POA: Diagnosis present

## 2022-02-12 DIAGNOSIS — G9340 Encephalopathy, unspecified: Secondary | ICD-10-CM

## 2022-02-12 DIAGNOSIS — E11622 Type 2 diabetes mellitus with other skin ulcer: Secondary | ICD-10-CM | POA: Diagnosis present

## 2022-02-12 DIAGNOSIS — D631 Anemia in chronic kidney disease: Secondary | ICD-10-CM | POA: Diagnosis present

## 2022-02-12 DIAGNOSIS — Z90710 Acquired absence of both cervix and uterus: Secondary | ICD-10-CM

## 2022-02-12 DIAGNOSIS — Z7189 Other specified counseling: Secondary | ICD-10-CM | POA: Diagnosis not present

## 2022-02-12 DIAGNOSIS — Z6841 Body Mass Index (BMI) 40.0 and over, adult: Secondary | ICD-10-CM

## 2022-02-12 DIAGNOSIS — L97819 Non-pressure chronic ulcer of other part of right lower leg with unspecified severity: Secondary | ICD-10-CM | POA: Diagnosis present

## 2022-02-12 DIAGNOSIS — K7682 Hepatic encephalopathy: Secondary | ICD-10-CM | POA: Diagnosis present

## 2022-02-12 DIAGNOSIS — D580 Hereditary spherocytosis: Secondary | ICD-10-CM | POA: Diagnosis not present

## 2022-02-12 DIAGNOSIS — Z794 Long term (current) use of insulin: Secondary | ICD-10-CM

## 2022-02-12 DIAGNOSIS — Z9081 Acquired absence of spleen: Secondary | ICD-10-CM

## 2022-02-12 DIAGNOSIS — I4811 Longstanding persistent atrial fibrillation: Secondary | ICD-10-CM | POA: Diagnosis present

## 2022-02-12 DIAGNOSIS — Z20822 Contact with and (suspected) exposure to covid-19: Secondary | ICD-10-CM | POA: Diagnosis present

## 2022-02-12 DIAGNOSIS — Z66 Do not resuscitate: Secondary | ICD-10-CM | POA: Diagnosis present

## 2022-02-12 DIAGNOSIS — I130 Hypertensive heart and chronic kidney disease with heart failure and stage 1 through stage 4 chronic kidney disease, or unspecified chronic kidney disease: Principal | ICD-10-CM | POA: Diagnosis present

## 2022-02-12 DIAGNOSIS — B9620 Unspecified Escherichia coli [E. coli] as the cause of diseases classified elsewhere: Secondary | ICD-10-CM | POA: Diagnosis present

## 2022-02-12 DIAGNOSIS — R0600 Dyspnea, unspecified: Secondary | ICD-10-CM

## 2022-02-12 DIAGNOSIS — Z808 Family history of malignant neoplasm of other organs or systems: Secondary | ICD-10-CM

## 2022-02-12 DIAGNOSIS — I493 Ventricular premature depolarization: Secondary | ICD-10-CM | POA: Diagnosis present

## 2022-02-12 DIAGNOSIS — I872 Venous insufficiency (chronic) (peripheral): Secondary | ICD-10-CM | POA: Diagnosis present

## 2022-02-12 DIAGNOSIS — J449 Chronic obstructive pulmonary disease, unspecified: Secondary | ICD-10-CM | POA: Diagnosis present

## 2022-02-12 DIAGNOSIS — N390 Urinary tract infection, site not specified: Secondary | ICD-10-CM | POA: Diagnosis present

## 2022-02-12 DIAGNOSIS — Z7984 Long term (current) use of oral hypoglycemic drugs: Secondary | ICD-10-CM

## 2022-02-12 DIAGNOSIS — I4891 Unspecified atrial fibrillation: Secondary | ICD-10-CM | POA: Diagnosis not present

## 2022-02-12 DIAGNOSIS — K761 Chronic passive congestion of liver: Secondary | ICD-10-CM | POA: Diagnosis present

## 2022-02-12 DIAGNOSIS — N1832 Chronic kidney disease, stage 3b: Secondary | ICD-10-CM | POA: Diagnosis not present

## 2022-02-12 DIAGNOSIS — I509 Heart failure, unspecified: Secondary | ICD-10-CM

## 2022-02-12 DIAGNOSIS — E1121 Type 2 diabetes mellitus with diabetic nephropathy: Secondary | ICD-10-CM | POA: Diagnosis not present

## 2022-02-12 DIAGNOSIS — Z515 Encounter for palliative care: Secondary | ICD-10-CM | POA: Diagnosis not present

## 2022-02-12 DIAGNOSIS — N179 Acute kidney failure, unspecified: Secondary | ICD-10-CM | POA: Diagnosis present

## 2022-02-12 DIAGNOSIS — E119 Type 2 diabetes mellitus without complications: Secondary | ICD-10-CM

## 2022-02-12 DIAGNOSIS — R911 Solitary pulmonary nodule: Secondary | ICD-10-CM | POA: Diagnosis present

## 2022-02-12 DIAGNOSIS — Z7951 Long term (current) use of inhaled steroids: Secondary | ICD-10-CM

## 2022-02-12 DIAGNOSIS — Z9049 Acquired absence of other specified parts of digestive tract: Secondary | ICD-10-CM

## 2022-02-12 DIAGNOSIS — N184 Chronic kidney disease, stage 4 (severe): Secondary | ICD-10-CM | POA: Diagnosis present

## 2022-02-12 DIAGNOSIS — I10 Essential (primary) hypertension: Secondary | ICD-10-CM | POA: Diagnosis not present

## 2022-02-12 DIAGNOSIS — I4819 Other persistent atrial fibrillation: Secondary | ICD-10-CM | POA: Diagnosis not present

## 2022-02-12 DIAGNOSIS — D649 Anemia, unspecified: Secondary | ICD-10-CM

## 2022-02-12 DIAGNOSIS — I1310 Hypertensive heart and chronic kidney disease without heart failure, with stage 1 through stage 4 chronic kidney disease, or unspecified chronic kidney disease: Secondary | ICD-10-CM

## 2022-02-12 DIAGNOSIS — F3289 Other specified depressive episodes: Secondary | ICD-10-CM | POA: Diagnosis not present

## 2022-02-12 DIAGNOSIS — E1169 Type 2 diabetes mellitus with other specified complication: Secondary | ICD-10-CM | POA: Diagnosis present

## 2022-02-12 DIAGNOSIS — Z833 Family history of diabetes mellitus: Secondary | ICD-10-CM

## 2022-02-12 LAB — BASIC METABOLIC PANEL
Anion gap: 12 (ref 5–15)
BUN: 66 mg/dL — ABNORMAL HIGH (ref 8–23)
CO2: 30 mmol/L (ref 22–32)
Calcium: 9.3 mg/dL (ref 8.9–10.3)
Chloride: 97 mmol/L — ABNORMAL LOW (ref 98–111)
Creatinine, Ser: 2.66 mg/dL — ABNORMAL HIGH (ref 0.44–1.00)
GFR, Estimated: 17 mL/min — ABNORMAL LOW (ref >60)
Glucose, Bld: 173 mg/dL — ABNORMAL HIGH (ref 70–99)
Potassium: 4 mmol/L (ref 3.5–5.1)
Sodium: 139 mmol/L (ref 135–145)

## 2022-02-12 LAB — CBC WITH DIFFERENTIAL/PLATELET
Abs Immature Granulocytes: 0.03 10*3/uL (ref 0.00–0.07)
Basophils Absolute: 0.1 10*3/uL (ref 0.0–0.1)
Basophils Relative: 1 %
Eosinophils Absolute: 0.4 10*3/uL (ref 0.0–0.5)
Eosinophils Relative: 4 %
HCT: 29.5 % — ABNORMAL LOW (ref 36.0–46.0)
Hemoglobin: 8 g/dL — ABNORMAL LOW (ref 12.0–15.0)
Immature Granulocytes: 0 %
Lymphocytes Relative: 16 %
Lymphs Abs: 1.8 10*3/uL (ref 0.7–4.0)
MCH: 28.7 pg (ref 26.0–34.0)
MCHC: 27.1 g/dL — ABNORMAL LOW (ref 30.0–36.0)
MCV: 105.7 fL — ABNORMAL HIGH (ref 80.0–100.0)
Monocytes Absolute: 1.4 10*3/uL — ABNORMAL HIGH (ref 0.1–1.0)
Monocytes Relative: 12 %
Neutro Abs: 7.4 10*3/uL (ref 1.7–7.7)
Neutrophils Relative %: 67 %
Platelets: 507 10*3/uL — ABNORMAL HIGH (ref 150–400)
RBC: 2.79 MIL/uL — ABNORMAL LOW (ref 3.87–5.11)
RDW: 16.6 % — ABNORMAL HIGH (ref 11.5–15.5)
WBC: 11.1 10*3/uL — ABNORMAL HIGH (ref 4.0–10.5)
nRBC: 1 % — ABNORMAL HIGH (ref 0.0–0.2)

## 2022-02-12 LAB — HEPATIC FUNCTION PANEL
ALT: 16 U/L (ref 0–44)
AST: 19 U/L (ref 15–41)
Albumin: 3.1 g/dL — ABNORMAL LOW (ref 3.5–5.0)
Alkaline Phosphatase: 70 U/L (ref 38–126)
Bilirubin, Direct: 0.2 mg/dL (ref 0.0–0.2)
Indirect Bilirubin: 0.6 mg/dL (ref 0.3–0.9)
Total Bilirubin: 0.8 mg/dL (ref 0.3–1.2)
Total Protein: 6.5 g/dL (ref 6.5–8.1)

## 2022-02-12 LAB — RESP PANEL BY RT-PCR (FLU A&B, COVID) ARPGX2
Influenza A by PCR: NEGATIVE
Influenza B by PCR: NEGATIVE
SARS Coronavirus 2 by RT PCR: NEGATIVE

## 2022-02-12 LAB — TYPE AND SCREEN
ABO/RH(D): A NEG
Antibody Screen: NEGATIVE

## 2022-02-12 LAB — BRAIN NATRIURETIC PEPTIDE: B Natriuretic Peptide: 361.8 pg/mL — ABNORMAL HIGH (ref 0.0–100.0)

## 2022-02-12 LAB — TROPONIN I (HIGH SENSITIVITY): Troponin I (High Sensitivity): 16 ng/L (ref <18)

## 2022-02-12 LAB — CBG MONITORING, ED: Glucose-Capillary: 151 mg/dL — ABNORMAL HIGH (ref 70–99)

## 2022-02-12 LAB — ABO/RH: ABO/RH(D): A NEG

## 2022-02-12 MED ORDER — SODIUM CHLORIDE 0.9 % IV SOLN: INTRAVENOUS | Status: DC

## 2022-02-12 MED ORDER — FUROSEMIDE 10 MG/ML IJ SOLN
80.0000 mg | Freq: Once | INTRAMUSCULAR | Status: AC
  Administered 2022-02-12: 80 mg via INTRAVENOUS
  Filled 2022-02-12: qty 8

## 2022-02-12 MED ORDER — POLYSACCHARIDE IRON COMPLEX 150 MG PO CAPS
150.0000 mg | ORAL_CAPSULE | Freq: Every day | ORAL | Status: DC
  Administered 2022-02-13 – 2022-03-02 (×18): 150 mg via ORAL
  Filled 2022-02-12 (×18): qty 1

## 2022-02-12 MED ORDER — LEVALBUTEROL HCL 0.63 MG/3ML IN NEBU: 0.6300 mg | INHALATION_SOLUTION | Freq: Four times a day (QID) | RESPIRATORY_TRACT | Status: DC | PRN

## 2022-02-12 MED ORDER — TIOTROPIUM BROMIDE MONOHYDRATE 2.5 MCG/ACT IN AERS
1.0000 | INHALATION_SPRAY | Freq: Every day | RESPIRATORY_TRACT | Status: DC
Start: 2022-02-12 — End: 2022-02-12

## 2022-02-12 MED ORDER — MAGNESIUM SULFATE 2 GM/50ML IV SOLN
2.0000 g | Freq: Once | INTRAVENOUS | Status: AC
  Administered 2022-02-12: 2 g via INTRAVENOUS
  Filled 2022-02-12: qty 50

## 2022-02-12 MED ORDER — INSULIN ASPART 100 UNIT/ML IJ SOLN
0.0000 [IU] | Freq: Three times a day (TID) | INTRAMUSCULAR | Status: DC
  Administered 2022-02-12: 2 [IU] via SUBCUTANEOUS
  Administered 2022-02-13 (×2): 3 [IU] via SUBCUTANEOUS
  Administered 2022-02-14: 5 [IU] via SUBCUTANEOUS
  Administered 2022-02-14: 3 [IU] via SUBCUTANEOUS
  Administered 2022-02-15: 2 [IU] via SUBCUTANEOUS
  Administered 2022-02-15: 5 [IU] via SUBCUTANEOUS
  Administered 2022-02-15: 2 [IU] via SUBCUTANEOUS
  Administered 2022-02-16: 5 [IU] via SUBCUTANEOUS
  Administered 2022-02-16: 2 [IU] via SUBCUTANEOUS
  Administered 2022-02-16: 3 [IU] via SUBCUTANEOUS
  Administered 2022-02-17: 2 [IU] via SUBCUTANEOUS
  Administered 2022-02-17: 3 [IU] via SUBCUTANEOUS
  Administered 2022-02-17: 5 [IU] via SUBCUTANEOUS
  Administered 2022-02-18: 2 [IU] via SUBCUTANEOUS
  Administered 2022-02-18 (×2): 3 [IU] via SUBCUTANEOUS
  Administered 2022-02-19: 5 [IU] via SUBCUTANEOUS
  Administered 2022-02-19: 2 [IU] via SUBCUTANEOUS
  Administered 2022-02-19: 5 [IU] via SUBCUTANEOUS
  Administered 2022-02-20: 3 [IU] via SUBCUTANEOUS
  Administered 2022-02-20: 5 [IU] via SUBCUTANEOUS
  Administered 2022-02-20 – 2022-02-21 (×2): 2 [IU] via SUBCUTANEOUS
  Administered 2022-02-21: 3 [IU] via SUBCUTANEOUS
  Administered 2022-02-21 – 2022-02-22 (×3): 2 [IU] via SUBCUTANEOUS
  Administered 2022-02-22 – 2022-02-23 (×2): 3 [IU] via SUBCUTANEOUS
  Administered 2022-02-23: 2 [IU] via SUBCUTANEOUS
  Administered 2022-02-23: 3 [IU] via SUBCUTANEOUS
  Administered 2022-02-24: 2 [IU] via SUBCUTANEOUS
  Administered 2022-02-24: 3 [IU] via SUBCUTANEOUS
  Administered 2022-02-24: 2 [IU] via SUBCUTANEOUS
  Administered 2022-02-25: 7 [IU] via SUBCUTANEOUS
  Administered 2022-02-25: 2 [IU] via SUBCUTANEOUS
  Administered 2022-02-25 – 2022-02-26 (×4): 3 [IU] via SUBCUTANEOUS
  Administered 2022-02-27: 5 [IU] via SUBCUTANEOUS
  Administered 2022-02-27: 2 [IU] via SUBCUTANEOUS
  Administered 2022-02-27 – 2022-02-28 (×2): 5 [IU] via SUBCUTANEOUS
  Administered 2022-02-28: 2 [IU] via SUBCUTANEOUS
  Administered 2022-02-28: 3 [IU] via SUBCUTANEOUS
  Administered 2022-03-01: 5 [IU] via SUBCUTANEOUS
  Administered 2022-03-01: 3 [IU] via SUBCUTANEOUS
  Administered 2022-03-01 – 2022-03-02 (×2): 2 [IU] via SUBCUTANEOUS

## 2022-02-12 MED ORDER — SALINE SPRAY 0.65 % NA SOLN
1.0000 | NASAL | Status: DC | PRN
  Administered 2022-02-13: 1 via NASAL
  Filled 2022-02-12: qty 44

## 2022-02-12 MED ORDER — PANTOPRAZOLE SODIUM 40 MG PO TBEC
40.0000 mg | DELAYED_RELEASE_TABLET | Freq: Every day | ORAL | Status: DC
  Administered 2022-02-13 – 2022-03-02 (×18): 40 mg via ORAL
  Filled 2022-02-12 (×18): qty 1

## 2022-02-12 MED ORDER — ONDANSETRON HCL 4 MG/2ML IJ SOLN: 4.0000 mg | Freq: Four times a day (QID) | INTRAMUSCULAR | Status: DC | PRN

## 2022-02-12 MED ORDER — GABAPENTIN 100 MG PO CAPS
100.0000 mg | ORAL_CAPSULE | Freq: Three times a day (TID) | ORAL | Status: DC
  Administered 2022-02-12 – 2022-02-18 (×18): 100 mg via ORAL
  Filled 2022-02-12 (×18): qty 1

## 2022-02-12 MED ORDER — APIXABAN 2.5 MG PO TABS
2.5000 mg | ORAL_TABLET | Freq: Two times a day (BID) | ORAL | Status: DC
  Administered 2022-02-13 – 2022-03-02 (×36): 2.5 mg via ORAL
  Filled 2022-02-12 (×36): qty 1

## 2022-02-12 MED ORDER — SODIUM CHLORIDE 0.9% FLUSH: 3.0000 mL | INTRAVENOUS | Status: DC | PRN

## 2022-02-12 MED ORDER — ATORVASTATIN CALCIUM 10 MG PO TABS: 20.0000 mg | ORAL_TABLET | ORAL | Status: DC

## 2022-02-12 MED ORDER — DILTIAZEM HCL ER COATED BEADS 120 MG PO CP24
120.0000 mg | ORAL_CAPSULE | Freq: Once | ORAL | Status: AC
  Administered 2022-02-12: 120 mg via ORAL
  Filled 2022-02-12: qty 1

## 2022-02-12 MED ORDER — SERTRALINE HCL 100 MG PO TABS
100.0000 mg | ORAL_TABLET | Freq: Every day | ORAL | Status: DC
  Administered 2022-02-13 – 2022-03-02 (×18): 100 mg via ORAL
  Filled 2022-02-12 (×18): qty 1

## 2022-02-12 MED ORDER — LORATADINE 10 MG PO TABS
10.0000 mg | ORAL_TABLET | Freq: Every day | ORAL | Status: DC
  Administered 2022-02-13 – 2022-02-27 (×15): 10 mg via ORAL
  Filled 2022-02-12 (×15): qty 1

## 2022-02-12 MED ORDER — INSULIN ASPART 100 UNIT/ML IJ SOLN
0.0000 [IU] | Freq: Every day | INTRAMUSCULAR | Status: DC
  Administered 2022-02-13: 3 [IU] via SUBCUTANEOUS
  Administered 2022-02-14 – 2022-02-21 (×5): 2 [IU] via SUBCUTANEOUS
  Administered 2022-02-26: 3 [IU] via SUBCUTANEOUS
  Administered 2022-02-27 – 2022-03-01 (×3): 2 [IU] via SUBCUTANEOUS

## 2022-02-12 MED ORDER — UMECLIDINIUM BROMIDE 62.5 MCG/ACT IN AEPB
1.0000 | INHALATION_SPRAY | Freq: Every day | RESPIRATORY_TRACT | Status: DC
  Administered 2022-02-13 – 2022-03-02 (×18): 1 via RESPIRATORY_TRACT
  Filled 2022-02-12 (×3): qty 7

## 2022-02-12 MED ORDER — METOPROLOL TARTRATE 5 MG/5ML IV SOLN: 2.5000 mg | Freq: Four times a day (QID) | INTRAVENOUS | Status: DC | PRN

## 2022-02-12 MED ORDER — FLUOCINONIDE 0.05 % EX CREA
1.0000 "application " | TOPICAL_CREAM | Freq: Every day | CUTANEOUS | Status: DC | PRN
  Administered 2022-02-22: 1 via TOPICAL
  Filled 2022-02-12: qty 15

## 2022-02-12 MED ORDER — SODIUM CHLORIDE 0.9 % IV SOLN: 250.0000 mL | INTRAVENOUS | Status: DC | PRN

## 2022-02-12 MED ORDER — ATORVASTATIN CALCIUM 10 MG PO TABS
10.0000 mg | ORAL_TABLET | ORAL | Status: DC
  Administered 2022-02-14 – 2022-02-26 (×8): 10 mg via ORAL
  Filled 2022-02-12 (×9): qty 1

## 2022-02-12 MED ORDER — ACETAMINOPHEN 325 MG PO TABS
650.0000 mg | ORAL_TABLET | ORAL | Status: DC | PRN
  Administered 2022-02-28: 650 mg via ORAL
  Filled 2022-02-12: qty 2

## 2022-02-12 MED ORDER — METOPROLOL TARTRATE 5 MG/5ML IV SOLN
2.5000 mg | Freq: Once | INTRAVENOUS | Status: AC
  Administered 2022-02-12: 2.5 mg via INTRAVENOUS
  Filled 2022-02-12: qty 5

## 2022-02-12 MED ORDER — SODIUM CHLORIDE 0.9% FLUSH
3.0000 mL | Freq: Two times a day (BID) | INTRAVENOUS | Status: DC
Start: 2022-02-12 — End: 2022-03-02
  Administered 2022-02-13 – 2022-03-01 (×33): 3 mL via INTRAVENOUS

## 2022-02-12 MED ORDER — ATORVASTATIN CALCIUM 10 MG PO TABS
20.0000 mg | ORAL_TABLET | ORAL | Status: DC
  Administered 2022-02-13: 20 mg via ORAL
  Filled 2022-02-12: qty 2

## 2022-02-12 NOTE — ED Triage Notes (Signed)
Pt to ED via EMS from home c/o [facility]. Pt home o2 user at 4L. Pt c/o SHOB with exertion ; progressively worse over past 3 days. Reports desats with exertion as low as 70s at home. HX CHF, AFIB. Scheduled cardioversion March 8. Last  VS:  96/54, hr 85-145 irregular A FIB , 99% 4L. #20 LAC. No medications given by EMS. Home health care sees pt, legs bilat wrapped for weeping edema.

## 2022-02-12 NOTE — ED Provider Notes (Addendum)
[facility] EMERGENCY DEPARTMENT Provider Note   CSN: [id number] Arrival date & time: 02/12/22  1256     History  Chief Complaint  Patient presents with   Shortness of Breath    Katelyn Mahoney is a 88 y.o. female.  Patient followed by [facility] cardiology.  Patient brought in by [facility].  Home nurse had contacted cardiology group.  Suspicious that her congestive heart failure had gotten worse.  Patient with worse exertional shortness of breath.  Gotten worse over the past 3 days.  Patient has a history of congestive heart failure diastolic atrial fibs.  Patient is on diltiazem and patient is on Eliquis.  And patient is on to row some mild.  60 mg tablet 2 times a day.  Patient states she is normally on 4 L of oxygen all the time.  Patient currently on 5 L.  Oxygen sats are 100%.  So we will drop that down to 4.  Patient has chronic swelling to both legs.  Both legs are wrapped.  Patient also states she has had some intermittent chest pain anterior part of the chest particularly when she gets more short of breath.      Home Medications Prior to Admission medications   Medication Sig Start Date End Date Taking? Authorizing Provider  atorvastatin (LIPITOR) 20 MG tablet TAKE 1/2 TABLET ON MONDAY, WEDNESDAY, FRIDAY AND      SUNDAYS 02/12/22   Carollee Herter, Alferd Apa, DO  acetaminophen (TYLENOL) 650 MG CR tablet 650 mg every 8 (eight) hours.    [provider]  albuterol (VENTOLIN HFA) 108 (90 Base) MCG/ACT inhaler Inhale 2 puffs into the lungs every 6 (six) hours as needed for wheezing or shortness of breath. 01/26/22   Marianna Payment, MD  apixaban (ELIQUIS) 2.5 MG TABS tablet Take 1 tablet (2.5 mg total) by mouth 2 (two) times daily. 02/08/22 03/10/22  Patwardhan, Reynold Bowen, MD  Bilberry, Vaccinium myrtillus, (BILBERRY PO) Take 1 tablet by mouth daily.    [provider]  diltiazem (CARDIZEM CD) 120 MG 24 hr capsule Take 1 capsule (120 mg total) by  mouth daily. 02/08/22 03/10/22  Patwardhan, Reynold Bowen, MD  fluocinonide cream (LIDEX) 4.94 % Apply 1 application topically daily as needed (breakouts).    [provider]  gabapentin (NEURONTIN) 100 MG capsule TAKE 1 CAPSULE BY MOUTH 3 TIMES DAILY 01/29/22   Elayne Snare, MD  glipiZIDE (GLUCOTROL XL) 10 MG 24 hr tablet TAKE 1 TABLET EVERY MORNING 02/12/22   Elayne Snare, MD  Glucosamine HCl (GLUCOSAMINE PO) Take 1 tablet by mouth daily.    [provider]  insulin lispro (HUMALOG KWIKPEN) 100 UNIT/ML KwikPen Inject 6 Units into the skin with breakfast, with lunch, and with evening meal. Inject 7-9 units under the skin three times daily before meals. E11.65 12/27/21   [name], MD  Insulin Pen Needle 32G X 4 MM MISC Use on insulin pens 4 times a day 08/10/21   Elayne Snare, MD  ipratropium-albuterol (DUONEB) 0.5-2.5 (3) MG/3ML SOLN Take 3 mLs by nebulization every 6 (six) hours as needed. 01/26/22 04/26/22  Marianna Payment, MD  iron polysaccharides (NU-IRON) 150 MG capsule Take 1 capsule (150 mg total) by mouth daily. 01/26/22 04/26/22  Marianna Payment, MD  Loratadine 10 MG CAPS Take 10 mg by mouth daily.    [provider]  metoprolol tartrate (LOPRESSOR) 25 MG tablet Take 1 tablet (25 mg total) by mouth 2 (two) times daily. 01/26/22 04/26/22  Marianna Payment, MD  multivitamin-lutein [facility]) CAPS capsule Take 1 capsule by mouth daily. 04/07/14   Midge Minium, MD  NONFORMULARY OR COMPOUNDED ITEM Lift chair  #1   Dx osteoarthritis knees 03/09/21   Carollee Herter, Alferd Apa, DO  OneTouch Delica Lancets 03T MISC Use to test blood sugar once daily- E11.65 05/10/21   Elayne Snare, MD  [facility] VERIO test strip USE TO TEST BLOOD SUGAR    TWICE A DAY 11/20/21   Elayne Snare, MD  OZEMPIC, 1 MG/DOSE, 4 MG/3ML SOPN INJECT 1MG  SUBCUTANEOUSLY  WEEKLY (EVERY 7 DAYS) Patient taking differently: Inject 1 mg into the skin every Saturday. 01/05/22   Elayne Snare, MD  pantoprazole (PROTONIX) 40 MG tablet  Take 1 tablet (40 mg total) by mouth daily. 02/08/22 03/10/22  Ann Held, DO  potassium chloride SA (KLOR-CON M) 20 MEQ tablet Take 2 tablets (40 mEq total) by mouth daily. 01/26/22 02/25/22  Marianna Payment, MD  sertraline (ZOLOFT) 100 MG tablet TAKE 1 TABLET DAILY Patient taking differently: Take 100 mg by mouth daily. 09/11/21   Ann Held, DO  sodium chloride (OCEAN) 0.65 % SOLN nasal spray Place 1 spray into both nostrils as needed for congestion.    [provider]  Tiotropium Bromide Monohydrate (SPIRIVA RESPIMAT) 2.5 MCG/ACT AERS Inhale 1 puff into the lungs daily. 01/26/22 04/26/22  Marianna Payment, MD  torsemide 60 MG TABS Take 60 mg by mouth 2 (two) times daily. 01/26/22 04/26/22  Marianna Payment, MD  TOUJEO SOLOSTAR 300 UNIT/ML Solostar Pen INJECT 34 UNITS            SUBCUTANEOUSLY DAILY 10/09/21   Elayne Snare, MD      Allergies    Bupropion hcl, Penicillins, Citalopram hydrobromide, Metformin and related, Zinc, Blue dyes (parenteral), Cobalt, Gold-containing drug products, and Nickel    Review of Systems   Review of Systems  Constitutional:  Negative for chills and fever.  HENT:  Negative for ear pain and sore throat.   Eyes:  Negative for pain and visual disturbance.  Respiratory:  Positive for shortness of breath. Negative for cough.   Cardiovascular:  Positive for chest pain, palpitations and leg swelling.  Gastrointestinal:  Negative for abdominal pain and vomiting.  Genitourinary:  Negative for dysuria and hematuria.  Musculoskeletal:  Negative for arthralgias and back pain.  Skin:  Negative for color change and rash.  Neurological:  Negative for seizures and syncope.  All other systems reviewed and are negative.  Physical Exam Updated Vital Signs BP 119/64    Pulse (!) 113    Temp 98.4 F (36.9 C) (Oral)    Resp (!) 22    Ht 1.651 m (5\' 5" )    Wt 131.1 kg    LMP  (LMP Unknown)    SpO2 98%    BMI 48.09 kg/m  Physical Exam Vitals and nursing note  reviewed.  Constitutional:      General: She is not in acute distress.    Appearance: Normal appearance. She is well-developed.  HENT:     Head: Normocephalic and atraumatic.  Eyes:     Extraocular Movements: Extraocular movements intact.     Conjunctiva/sclera: Conjunctivae normal.     Pupils: Pupils are equal, round, and reactive to light.  Cardiovascular:     Rate and Rhythm: Tachycardia present. Rhythm irregular.     Heart sounds: No murmur heard. Pulmonary:     Effort: Pulmonary effort is normal. No respiratory distress.  Breath sounds: Rales present.  Abdominal:     Palpations: Abdomen is soft.     Tenderness: There is no abdominal tenderness.  Musculoskeletal:        General: No swelling.     Cervical back: Neck supple.     Right lower leg: Edema present.     Left lower leg: Edema present.  Skin:    General: Skin is warm and dry.     Capillary Refill: Capillary refill takes less than 2 seconds.  Neurological:     General: No focal deficit present.     Mental Status: She is alert and oriented to person, place, and time.  Psychiatric:        Mood and Affect: Mood normal.    ED Results / Procedures / Treatments   Labs (all labs ordered are listed, but only abnormal results are displayed) Labs Reviewed  RESP PANEL BY RT-PCR (FLU A&B, COVID) [id number]  BASIC METABOLIC PANEL  CBC WITH DIFFERENTIAL/PLATELET  BRAIN NATRIURETIC PEPTIDE  HEPATIC FUNCTION PANEL  TROPONIN I (HIGH SENSITIVITY)    EKG EKG Interpretation  Date/Time:  Monday February 12 2022 13:12:26 EST Ventricular Rate:  125 PR Interval:    QRS Duration: 92 QT Interval:  308 QTC Calculation: 406 R Axis:   50 Text Interpretation: Atrial fibrillation Ventricular tachycardia, unsustained Borderline T abnormalities, diffuse leads No significant change since last tracing Old EKGs have shown atrial fibs Confirmed by Fredia Sorrow [phone]) on 02/12/2022 2:22:37 PM  Radiology No results  found.  Procedures Procedures    Medications Ordered in ED Medications  furosemide (LASIX) injection 80 mg (has no administration in time range)    ED Course/ Medical Decision Making/ A&P                           Medical Decision Making Amount and/or Complexity of Data Reviewed Labs: ordered. Radiology: ordered.  Risk Prescription drug management. Decision regarding hospitalization.   CRITICAL CARE Performed by: Fredia Sorrow Total critical care time: 40 minutes Critical care time was exclusive of separately billable procedures and treating other patients. Critical care was necessary to treat or prevent imminent or life-threatening deterioration. Critical care was time spent personally by me on the following activities: development of treatment plan with patient and/or surrogate as well as nursing, discussions with consultants, evaluation of patient's response to treatment, examination of patient, obtaining history from patient or surrogate, ordering and performing treatments and interventions, ordering and review of laboratory studies, ordering and review of radiographic studies, pulse oximetry and re-evaluation of patient's condition.  Patient followed by [facility] cardiology.  Most likely will require admission for exacerbation of congestive heart failure.  The patient also does have some chest pain so we will need to get some troponins on her.  Her atrial fibs has a little bit of RVR heart rate in the low 100s.  She is on her Eliquis.  We will give her additional Lasix she is taking her furosemide.  Chest x-ray is pending.  CBC White blood cell count 11,000 hemoglobin is 8.0.  So that somewhat borderline.  We will do a type and cross.  Other labs are still pending.  Chest x-ray seems to be consistent with CHF.  Discussed with on-call cardiology for [facility] cardiology,  Dr. Virgina Jock.  He is recommending hospitalist admission for exacerbation of CHF.  And also for difficulty  controlling her atrial fib.  He will consult.  Final Clinical Impression(s) / ED  Diagnoses Final diagnoses:  Acute on chronic diastolic congestive heart failure (HCC)  Longstanding persistent atrial fibrillation [facility])    Rx / DC Orders ED Discharge Orders     None         Fredia Sorrow, MD 02/12/22 1455    Fredia Sorrow, MD 02/12/22 1510

## 2022-02-12 NOTE — Patient Instructions (Signed)
Visit Information   Thank you for taking time to visit with me today. Please don't hesitate to contact me if I can be of assistance to you before our next scheduled telephone appointment.  Following are the goals we discussed today:  Patient Goals/Self-Care Activities: Receive bi-weekly calls from LCSW, in an effort to coordinate skilled nursing facility placement for short-term rehabilitative services. Review list of skilled nursing facilities, within a 50-mile radius of your home address, and be prepared to provide LCSW with at least 3 facilities of interest during our next scheduled telephone outreach call. LCSW will fax completed and signed FL-2 Form to all facilities of interest, including [facility] in [place], [place] [phone]), once obtained from Primary Care Physician, Dr. Roma Schanz. Continue to receive home health physical and occupational therapy services, twice per week, through [facility]. Contact LCSW directly (# [phone]), if you have questions, need assistance, or if additional social work needs are identified.   Follow-Up Plan:  LCSW will make contact with patient on 02/23/2022 at 9:00 am.  Please call the care guide team at [phone] if you need to cancel or reschedule your appointment.   If you are experiencing a Mental Health or [place] or need someone to talk to, please call the Suicide and Crisis Lifeline: 988 call the [place] National Suicide Prevention Lifeline: 9192549928 or TTY: 469-529-5681 TTY 201-653-5065) to talk to a trained counselor call 1-800-273-TALK (toll free, 24 hour hotline) go to [facility] [place] [phone]) call the [facility]: [phone] call 911   Following is a copy of your full care plan:  Care Plan : LCSW Plan of Care  Updates made by Francis Gaines, LCSW since 02/12/2022 12:00 AM     Problem:  Obtain Skilled Nursing Placement for [employer].   Priority: High     Goal: Obtain Skilled Nursing Placement for [employer].   Start Date: 02/09/2022  Expected End Date: 05/04/2022  This Visit's Progress: On track  Priority: High  Note:   Current Barriers:  Chronic Disease Management Support, Education, Resources, Referrals, Advocacy, and Care Coordination Needs related to Acute on Chronic Respiratory Failure with Hypoxia, Hypertension, Acute Exacerbation of Congestive Heart Failure, Type II Diabetes Mellitus, Stage III Chronic Kidney Disease, and Major Depression, Single Episode, in Partial Remission. Inability to consistently perform ADL's/IADL's independently. Clinical Goal(s):  Patient will work with LCSW, in an effort to identify and address any acute and chronic care coordination needs related to the self-health management of Acute on Chronic Respiratory Failure with Hypoxia, Hypertension, Acute Exacerbation of Congestive Heart Failure, Type II Diabetes Mellitus, Stage III Chronic Kidney Disease, and Major Depression, Single Episode, in Partial Remission. Patient desires to receive short-term inpatient rehabilitative services in a skilled nursing facility, and will work with LCSW to identify options.   Interventions: Thorough chart review performed and appropriate assessments completed. Inter-disciplinary care team collaboration (see longitudinal plan of care). Collaboration with Primary Care Physician, Dr. Roma Schanz regarding development and update of comprehensive plan of care as evidenced by provider attestation and co-signature. Collaboration with Primary Care Physician, Dr. Roma Schanz to request completed and signed FL-2 Form. Once completed and signed FL-2 Form is obtained, LCSW will fax to all skilled nursing facilities of interest, to try and pursue bed offers. LCSW Interventions:  Supportive Counseling and Attentive  Listening Skills were utilized when patient voiced concerns about not being able  to consistently perform ADL's/IADL's.   Collaboration with admissions coordinator at [facility] in [place], [place], to check female bed availability.   Collaboration with home health physical and occupational therapists with [facility], to request therapy notes and recommendations. Collaboration with representative from [facility], to provide FL-2 Form and therapy notes, to obtain prior approval and authorization for short-term skilled nursing facility placement.  LCSW provided patient with a list of skilled nursing facilities within a 50-mile radius of her home address.  Patient Goals/Self-Care Activities: Receive bi-weekly calls from LCSW, in an effort to coordinate skilled nursing facility placement for short-term rehabilitative services. Review list of skilled nursing facilities, within a 50-mile radius of your home address, and be prepared to provide LCSW with at least 3 facilities of interest during our next scheduled telephone outreach call. LCSW will fax completed and signed FL-2 Form to all facilities of interest, including [facility] in [place], [place] [phone]), once obtained from Primary Care Physician, Dr. Roma Schanz. Continue to receive home health physical and occupational therapy services, twice per week, through [facility]. Contact LCSW directly (# [phone]), if you have questions, need assistance, or if additional social work needs are identified.   Follow-Up Plan:  LCSW will make contact with patient on 02/23/2022 at 9:00 am.       Consent to CCM Services: Katelyn Mahoney was given information about Chronic Care Management services including:  CCM service includes personalized support from designated clinical staff supervised by her physician, including individualized plan of care and coordination with other care  providers 24/7 contact phone numbers for assistance for urgent and routine care needs. Service will only be billed when office clinical staff spend 20 minutes or more in a month to coordinate care. Only one practitioner may furnish and bill the service in a calendar month. The patient may stop CCM services at any time (effective at the end of the month) by phone call to the office staff. The patient will be responsible for cost sharing (co-pay) of up to 20% of the service fee (after annual deductible is met).  Patient agreed to services and verbal consent obtained.   Patient verbalizes understanding of instructions and care plan provided today and agrees to view in [place]. Active MyChart status confirmed with patient.    Telephone follow up appointment with care management team member scheduled for:  02/23/2022 at 9:00 am.  Nat Christen Arkoe Licensed Clinical Social Worker [place] [phone]

## 2022-02-12 NOTE — H&P (View-Only) (Signed)
CARDIOLOGY CONSULT NOTE  Patient ID: Katelyn Mahoney MRN: [id number] DOB/AGE: [birth year] 88 y.o.  Admit date: 02/12/2022 Referring Physician: [facility] Reason for Consultation:  Afib, CHF  HPI:   88 year old Caucasian female with hypertension, type II Mellitus, obesity, persistent A-fib diagnosed 11/2021 during hospitalization for acute hypoxic respiratory failure due to pneumonia and HFpEF, possible, subsequent AKI, acute heart failure exacerbation.  Patient was seen by me in office on 02/08/2022.  I felt her respiratory failure was multifactorial, including HFpEF, obesity, deconditioning, worsening AKI, as well as persistent A-fib.  I recommended outpatient cardioversion for her atrial fibrillation.  Today, patient had worsening of her dyspnea and was found to be hypoxic in 86s at home on 4 L oxygen.  She was brought to emergency room.  She has been in A-fib RVR with rates up to 120s.  Chest x-ray showed mild congestive heart failure, left lower lobe atelectasis.  Past Medical History:  Diagnosis Date   Allergy    Anxiety    Arthritis    Arthritis    Callus    Depression    Diabetes mellitus without complication ([place])    Hyperlipidemia    Hypertension      Past Surgical History:  Procedure Laterality Date   ABDOMINAL HYSTERECTOMY     CHOLECYSTECTOMY     COLONOSCOPY WITH PROPOFOL N/A 01/12/2022   Procedure: COLONOSCOPY WITH PROPOFOL;  Surgeon: Carol Ada, MD;  Location: [place];  Service: Endoscopy;  Laterality: N/A;   ESOPHAGOGASTRODUODENOSCOPY N/A 01/10/2022   Procedure: ESOPHAGOGASTRODUODENOSCOPY (EGD);  Surgeon: Carol Ada, MD;  Location: [place];  Service: Endoscopy;  Laterality: N/A;   HEMOSTASIS CLIP PLACEMENT  01/12/2022   Procedure: HEMOSTASIS CLIP PLACEMENT;  Surgeon: Carol Ada, MD;  Location: [place];  Service: Endoscopy;;   HOT HEMOSTASIS N/A 01/10/2022   Procedure: HOT HEMOSTASIS (ARGON PLASMA COAGULATION/BICAP);  Surgeon: Carol Ada, MD;   Location: [place];  Service: Endoscopy;  Laterality: N/A;   HOT HEMOSTASIS N/A 01/12/2022   Procedure: HOT HEMOSTASIS (ARGON PLASMA COAGULATION/BICAP);  Surgeon: Carol Ada, MD;  Location: [place];  Service: Endoscopy;  Laterality: N/A;   SPLENECTOMY        Family History  Problem Relation Age of Onset   Cancer Mother    Diabetes Mother    Cancer Father    Diabetes Father    Cancer Sister    Diabetes Sister    Cancer Brother    Brain cancer Brother    Diabetes Brother    Diabetes Maternal Grandmother    Diabetes Paternal Grandmother    Cancer - Lung Brother    Diabetes Brother      Social History: Social History   Socioeconomic History   Marital status: Widowed    Spouse name: Not on file   Number of children: 1   Years of education: 65   Highest education level: 12th grade  Occupational History   Not on file  Tobacco Use   Smoking status: Never    Passive exposure: Never   Smokeless tobacco: Never  Vaping Use   Vaping Use: Never used  Substance and Sexual Activity   Alcohol use: No    Alcohol/week: 0.0 standard drinks   Drug use: No   Sexual activity: Never    Birth control/protection: Abstinence  Other Topics Concern   Not on file  Social History Narrative   Not on file   Social Determinants of Health   Financial Resource Strain: Low Risk    Difficulty of Paying  Living Expenses: Not very hard  Food Insecurity: No Food Insecurity   Worried About Christmas in the Last Year: Never true   Ran Out of Food in the Last Year: Never true  Transportation Needs: No Transportation Needs   Lack of Transportation (Medical): No   Lack of Transportation (Non-Medical): No  Physical Activity: Inactive   Days of Exercise per Week: 0 days   Minutes of Exercise per Session: 0 min  Stress: No Stress Concern Present   Feeling of Stress : Only a little  Social Connections: Moderately Isolated   Frequency of Communication with Friends and Family:  More than three times a week   Frequency of Social Gatherings with Friends and Family: More than three times a week   Attends Religious Services: 1 to 4 times per year   Active Member of [employer] or Organizations: No   Attends Archivist Meetings: Never   Marital Status: Widowed  Human resources officer Violence: Not At Risk   Fear of Current or Ex-Partner: No   Emotionally Abused: No   Physically Abused: No   Sexually Abused: No     (Not in a hospital admission)   Review of Systems  Cardiovascular:  Positive for dyspnea on exertion and leg swelling. Negative for chest pain, palpitations and syncope.  Respiratory:  Positive for shortness of breath.      Physical Exam: Physical Exam Vitals and nursing note reviewed.  Constitutional:      General: She is not in acute distress. Neck:     Vascular: No JVD.  Cardiovascular:     Rate and Rhythm: Tachycardia present. Rhythm irregular.     Heart sounds: Normal heart sounds. No murmur heard. Pulmonary:     Effort: Pulmonary effort is normal.     Breath sounds: Normal breath sounds. No wheezing or rales.  Musculoskeletal:     Right lower leg: Edema present.     Left lower leg: Edema present.       Lab Results: Reviewed and interpreted: CBC, BMP  Imaging/tests reviewed and independently interpreted: Chest x-ray 02/12/2022: Mild congestive heart failure.  Left lower lobe atelectasis.    Cardiac Studies:  EKG 02/08/2022: Atrial fibrillation 114 bpm Frequent PVCs  Nonspecific T-abnormality  Low voltage   Echocardiogram 01/22/2022:  1. ? left pleural effusion.   2. Left ventricular ejection fraction, by estimation, is 50 to 55%. The  left ventricle has low normal function. The left ventricle has no regional  wall motion abnormalities. Left ventricular diastolic parameters are  indeterminate.   3. Right ventricular systolic function is mildly reduced. The right  ventricular size is mildly enlarged.   4. Left atrial size  was moderately dilated.   5. Right atrial size was moderately dilated.   6. The mitral valve is abnormal. Moderate mitral valve regurgitation. No  evidence of mitral stenosis.   7. Tricuspid valve regurgitation is moderate.   8. The aortic valve is tricuspid. There is moderate calcification of the  aortic valve. There is moderate thickening of the aortic valve. Aortic  valve regurgitation is trivial. Mild aortic valve stenosis.   9. The inferior vena cava is dilated in size with >50% respiratory  variability, suggesting right atrial pressure of 8 mmHg.    Echocardiogram 12/13/2021:  1. Left ventricular ejection fraction, by estimation, is 50 to 55%. The  left ventricle has low normal function. The left ventricle has no regional  wall motion abnormalities. Left ventricular diastolic parameters are  consistent  with Grade II diastolic  dysfunction (pseudonormalization).   2. Right ventricular systolic function is normal. The right ventricular  size is normal. There is moderately elevated pulmonary artery systolic  pressure. The estimated right ventricular systolic pressure is 18.8 mmHg.   3. A small pericardial effusion is present. The pericardial effusion is  circumferential.   4. The mitral valve is degenerative. Moderate mitral valve regurgitation.  No evidence of mitral stenosis.   5. The aortic valve is tricuspid. There is mild calcification of the  aortic valve. There is mild thickening of the aortic valve. Aortic valve  regurgitation is trivial. Aortic valve sclerosis/calcification is present,  without any evidence of aortic  stenosis.   6. The inferior vena cava is dilated in size with <50% respiratory  variability, suggesting right atrial pressure of 15 mmHg.    CT Chest 01/25/2022: Small bilateral pleural effusions are noted with adjacent subsegmental atelectasis.   7 mm subpleural nodule seen in right upper lobe. Non-contrast chest CT at 6-12 months is recommended. If the  nodule is stable at time of repeat CT, then future CT at 18-24 months (from today's scan) is considered optional for low-risk patients, but is recommended for high-risk patients. This recommendation follows the consensus statement: Guidelines for Management of Incidental Pulmonary Nodules Detected on CT Images: From the Fleischner Society 2017; Radiology 2017; 284:228-243.   Mild mosaic pattern is noted throughout both lungs suggesting air trapping secondary to small airways disease or potentially mild multifocal inflammation.   Mild coronary calcifications are noted.   Aortic Atherosclerosis (ICD10-I70.0).  Assessment & Recommendations:  88 year old Caucasian female with hypertension, type 2 diabetes mellitus, obesity, persistent A-fib, progressive CKD, admitted with acute hypoxic respiratory failure, acute on chronic HFpEF  Acute on chronic hypoxic respiratory failure: Likely multifactorial, including progression of chronic respiratory failure due to HFpEF, obesity, deconditioning, worsening AKI A-fib certainly contributing, see below.  Persistent A-fib: Rate around 110 bpm. BP fairly normal, barring one or two outliers.  While rate is not too high, I do think Afib is contributing to her worsening HFpEF We discussed inpatient cardioversion. Patient is agreeable. Keep NPO after midnight. I would avoid digoxin just before the cardioversion to avoid significant bradycardia.  I would like to avoid IV diltiazem to avoid additional volume. Instead, I have ordered an additional dose of PO diltiazem 120 mg daily, in addition to her morning dose of 120 mg daily, just for today. CHA2DS2VASc score high.  Continue Eliquis 2.5 mg twice daily.  Fortunately, she is tolerating eliquis without any major bleeding issues. EGD performed in 12/2021 for heme positive stools only showed gastritis.    Rest of the medical management as per the primary team.  Discussed interpretation of tests and  management recommendations with th primary team     Nigel Mormon, MD Pager: [phone] Office: [phone]

## 2022-02-12 NOTE — Telephone Encounter (Signed)
Recommend ER evaluation.

## 2022-02-12 NOTE — H&P (Signed)
History and Physical    Katelyn Mahoney LOV:[id number] DOB: [birth year] DOA: 02/12/2022  PCP: Ann Held, DO (Confirm with patient/family/NH records and if not entered, this has to be entered at [facility] point of entry) Patient coming from: Home  I have personally briefly reviewed patient's old medical records in [place]  Chief Complaint: SOB, leg swelling  HPI: Katelyn Mahoney is a 88 y.o. female with medical history significant of chronic diastolic CHF, chronic hypoxic respite failure on 4 L 24/7, chronic A-fib on Eliquis, chronic iron deficiency anemia, CKD stage IV,  IDDM, morbid obesity came with increasing shortness of breath and leg swelling.  Patient reported that her A-fib has not been controlled for the 1 week, she experienced frequent palpitations.  Meantime, she noticed her legs become more swollen and developed weeping ulcers on left ankle.  Gradually she developed exertional dyspnea, minimum activity can trigger shortness of breath and drop of oxygen saturation to mid 80s.  Occasionally, she also has pressure-like chest pains with exertion, then she had to rest for a few minutes to last episode pass.  Last month, her cardiology increased her torsemide from 40 mg to 60 mg BID.  Today, her wound care nurse noticed she has severe respiratory stress and sent her to ED.  She denies any cough, no fever chills no urinary symptoms no diarrhea.  Patient contacted her cardiology recently for uncontrolled A-fib, who recommended outpatient cardioversion on March 6.  ED Course: Blood pressure borderline low, in rapid A-fib with frequent PVCs.  No significant hypoxemia with baseline 4 L oxygen.  Chest x-ray showed mild pulmonary congestion.  Blood work showed creatinine 2.6 above baseline, hemoglobin 8.0 compared to 8.8 baseline.  80 mg of IV Lasix given in the ED.  Review of Systems: As per HPI otherwise 14 point review of systems negative.    Past Medical History:  Diagnosis  Date   Allergy    Anxiety    Arthritis    Arthritis    Callus    Depression    Diabetes mellitus without complication ([place])    Hyperlipidemia    Hypertension     Past Surgical History:  Procedure Laterality Date   ABDOMINAL HYSTERECTOMY     CHOLECYSTECTOMY     COLONOSCOPY WITH PROPOFOL N/A 01/12/2022   Procedure: COLONOSCOPY WITH PROPOFOL;  Surgeon: Carol Ada, MD;  Location: [place];  Service: Endoscopy;  Laterality: N/A;   ESOPHAGOGASTRODUODENOSCOPY N/A 01/10/2022   Procedure: ESOPHAGOGASTRODUODENOSCOPY (EGD);  Surgeon: Carol Ada, MD;  Location: [place];  Service: Endoscopy;  Laterality: N/A;   HEMOSTASIS CLIP PLACEMENT  01/12/2022   Procedure: HEMOSTASIS CLIP PLACEMENT;  Surgeon: Carol Ada, MD;  Location: [place];  Service: Endoscopy;;   HOT HEMOSTASIS N/A 01/10/2022   Procedure: HOT HEMOSTASIS (ARGON PLASMA COAGULATION/BICAP);  Surgeon: Carol Ada, MD;  Location: [place];  Service: Endoscopy;  Laterality: N/A;   HOT HEMOSTASIS N/A 01/12/2022   Procedure: HOT HEMOSTASIS (ARGON PLASMA COAGULATION/BICAP);  Surgeon: Carol Ada, MD;  Location: [place];  Service: Endoscopy;  Laterality: N/A;   SPLENECTOMY       reports that she has never smoked. She has never been exposed to tobacco smoke. She has never used smokeless tobacco. She reports that she does not drink alcohol and does not use drugs.  Allergies  Allergen Reactions   Bupropion Hcl     Palmar rash   Penicillins     rash   Citalopram Hydrobromide Other (See Comments)    cough  Metformin And Related Diarrhea   Zinc Other (See Comments)    unknown ...   Blue Dyes (Parenteral) Rash   Cobalt Rash   Gold-Containing Drug Products Rash   Nickel Rash    Family History  Problem Relation Age of Onset   Cancer Mother    Diabetes Mother    Cancer Father    Diabetes Father    Cancer Sister    Diabetes Sister    Cancer Brother    Brain cancer Brother    Diabetes Brother     Diabetes Maternal Grandmother    Diabetes Paternal Grandmother    Cancer - Lung Brother    Diabetes Brother      Prior to Admission medications   Medication Sig Start Date End Date Taking? Authorizing Provider  albuterol (VENTOLIN HFA) 108 (90 Base) MCG/ACT inhaler Inhale 2 puffs into the lungs every 6 (six) hours as needed for wheezing or shortness of breath. 01/26/22  Yes Marianna Payment, MD  apixaban (ELIQUIS) 2.5 MG TABS tablet Take 1 tablet (2.5 mg total) by mouth 2 (two) times daily. 02/08/22 03/10/22 Yes Patwardhan, Manish J, MD  atorvastatin (LIPITOR) 20 MG tablet TAKE 1/2 TABLET ON MONDAY, WEDNESDAY, FRIDAY AND      SUNDAYS 02/12/22  Yes Lowne Chase, Yvonne R, DO  Bilberry, Vaccinium myrtillus, (BILBERRY PO) Take 1 tablet by mouth daily.   Yes [provider]  diltiazem (CARDIZEM CD) 120 MG 24 hr capsule Take 1 capsule (120 mg total) by mouth daily. 02/08/22 03/10/22 Yes Patwardhan, Manish J, MD  fluocinonide cream (LIDEX) 8.25 % Apply 1 application topically daily as needed (breakouts).   Yes [provider]  gabapentin (NEURONTIN) 100 MG capsule TAKE 1 CAPSULE BY MOUTH 3 TIMES DAILY 01/29/22  Yes Elayne Snare, MD  Glucosamine HCl (GLUCOSAMINE PO) Take 1 tablet by mouth daily.   Yes [provider]  insulin lispro (HUMALOG KWIKPEN) 100 UNIT/ML KwikPen Inject 6 Units into the skin with breakfast, with lunch, and with evening meal. Inject 7-9 units under the skin three times daily before meals. E11.65 12/27/21  Yes Demaio, Alexa, MD  ipratropium-albuterol (DUONEB) 0.5-2.5 (3) MG/3ML SOLN Take 3 mLs by nebulization every 6 (six) hours as needed. 01/26/22 04/26/22 Yes Marianna Payment, MD  iron polysaccharides (NU-IRON) 150 MG capsule Take 1 capsule (150 mg total) by mouth daily. 01/26/22 04/26/22 Yes Marianna Payment, MD  Loratadine 10 MG CAPS Take 10 mg by mouth daily.   Yes [provider]  metoprolol tartrate (LOPRESSOR) 25 MG tablet Take 1 tablet (25 mg total) by mouth 2  (two) times daily. 01/26/22 04/26/22 Yes Marianna Payment, MD  multivitamin-lutein [facility]) CAPS capsule Take 1 capsule by mouth daily. 04/07/14  Yes Midge Minium, MD  OZEMPIC, 1 MG/DOSE, 4 MG/3ML SOPN INJECT 1MG  SUBCUTANEOUSLY  WEEKLY (EVERY 7 DAYS) Patient taking differently: Inject 1 mg into the skin every Saturday. 01/05/22  Yes Elayne Snare, MD  pantoprazole (PROTONIX) 40 MG tablet Take 1 tablet (40 mg total) by mouth daily. 02/08/22 03/10/22 Yes Lowne Lyndal Pulley R, DO  sertraline (ZOLOFT) 100 MG tablet TAKE 1 TABLET DAILY Patient taking differently: Take 100 mg by mouth daily. 09/11/21  Yes Roma Schanz R, DO  sodium chloride (OCEAN) 0.65 % SOLN nasal spray Place 1 spray into both nostrils as needed for congestion.   Yes [provider]  Tiotropium Bromide Monohydrate (SPIRIVA RESPIMAT) 2.5 MCG/ACT AERS Inhale 1 puff into the lungs daily. 01/26/22 04/26/22 Yes Marianna Payment, MD  torsemide 60 MG TABS Take 60 mg by mouth 2 (two) times daily. 01/26/22 04/26/22 Yes Marianna Payment, MD  TOUJEO SOLOSTAR 300 UNIT/ML Solostar Pen INJECT 34 UNITS            SUBCUTANEOUSLY DAILY 10/09/21  Yes Elayne Snare, MD  acetaminophen (TYLENOL) 650 MG CR tablet 650 mg every 8 (eight) hours.    [provider]  glipiZIDE (GLUCOTROL XL) 10 MG 24 hr tablet TAKE 1 TABLET EVERY MORNING 02/12/22   Elayne Snare, MD  Insulin Pen Needle 32G X 4 MM MISC Use on insulin pens 4 times a day 08/10/21   Elayne Snare, MD  NONFORMULARY OR COMPOUNDED ITEM Lift chair  #1   Dx osteoarthritis knees 03/09/21   Carollee Herter, Alferd Apa, DO  OneTouch Delica Lancets 46N MISC Use to test blood sugar once daily- E11.65 05/10/21   Elayne Snare, MD  [facility] VERIO test strip USE TO TEST BLOOD SUGAR    TWICE A DAY 11/20/21   Elayne Snare, MD  potassium chloride SA (KLOR-CON M) 20 MEQ tablet Take 2 tablets (40 mEq total) by mouth daily. 01/26/22 02/25/22  Marianna Payment, MD    Physical Exam: Vitals:   02/12/22 1345 02/12/22 1415  02/12/22 1445 02/12/22 1523  BP: 125/75 (!) 129/57 119/64 119/76  Pulse: 60 (!) 59 (!) 113 63  Resp: (!) 21 (!) 24 (!) 22 20  Temp:      TempSrc:      SpO2: 100% 100% 98% 95%  Weight:      Height:        Constitutional: NAD, calm, comfortable Vitals:   02/12/22 1345 02/12/22 1415 02/12/22 1445 02/12/22 1523  BP: 125/75 (!) 129/57 119/64 119/76  Pulse: 60 (!) 59 (!) 113 63  Resp: (!) 21 (!) 24 (!) 22 20  Temp:      TempSrc:      SpO2: 100% 100% 98% 95%  Weight:      Height:       Eyes: PERRL, lids and conjunctivae normal ENMT: Mucous membranes are moist. Posterior pharynx clear of any exudate or lesions.Normal dentition.  Neck: normal, supple, no masses, no thyromegaly Respiratory: clear to auscultation bilaterally, no wheezing, fine crackles to B/L mid levels.  Increasing respiratory effort.  Talking in broken sentences.  Positive signs of accessory muscle use.  Cardiovascular: Regular rate and rhythm, no murmurs / rubs / gallops. 2+ extremity edema. 2+ pedal pulses. No carotid bruits.  Abdomen: no tenderness, no masses palpated. No hepatosplenomegaly. Bowel sounds positive.  Musculoskeletal: no clubbing / cyanosis. No joint deformity upper and lower extremities. Good ROM, no contractures. Normal muscle tone.  Skin: no rashes, lesions, ulcers. No induration.  Weeping wounds on bilateral ankles Neurologic: CN 2-12 grossly intact. Sensation intact, DTR normal. Strength 5/5 in all 4.  Psychiatric: Normal judgment and insight. Alert and oriented x 3. Normal mood.    Labs on Admission: I have personally reviewed following labs and imaging studies  CBC: Recent Labs  Lab 02/12/22 1419  WBC 11.1*  NEUTROABS 7.4  HGB 8.0*  HCT 29.5*  MCV 105.7*  PLT 629*   Basic Metabolic Panel: Recent Labs  Lab 02/12/22 1419  NA 139  K 4.0  CL 97*  CO2 30  GLUCOSE 173*  BUN 66*  CREATININE 2.66*  CALCIUM 9.3   GFR: Estimated Creatinine Clearance: 21.1 mL/min (A) (by C-G formula  based on SCr of 2.66 mg/dL (H)). Liver Function Tests: Recent Labs  Lab 02/12/22 1419  AST 19  ALT 16  ALKPHOS 70  BILITOT 0.8  PROT 6.5  ALBUMIN 3.1*   No results for input(s): LIPASE, AMYLASE in the last 168 hours. No results for input(s): AMMONIA in the last 168 hours. Coagulation Profile: No results for input(s): INR, PROTIME in the last 168 hours. Cardiac Enzymes: No results for input(s): CKTOTAL, CKMB, CKMBINDEX, TROPONINI in the last 168 hours. BNP (last 3 results) No results for input(s): PROBNP in the last 8760 hours. HbA1C: No results for input(s): HGBA1C in the last 72 hours. CBG: No results for input(s): GLUCAP in the last 168 hours. Lipid Profile: No results for input(s): CHOL, HDL, LDLCALC, TRIG, CHOLHDL, LDLDIRECT in the last 72 hours. Thyroid Function Tests: No results for input(s): TSH, T4TOTAL, FREET4, T3FREE, THYROIDAB in the last 72 hours. Anemia Panel: No results for input(s): VITAMINB12, FOLATE, FERRITIN, TIBC, IRON, RETICCTPCT in the last 72 hours. Urine analysis:    Component Value Date/Time   COLORURINE YELLOW 01/21/2022 0949   APPEARANCEUR HAZY (A) 01/21/2022 0949   LABSPEC 1.008 01/21/2022 0949   PHURINE 5.0 01/21/2022 0949   GLUCOSEU NEGATIVE 01/21/2022 0949   HGBUR NEGATIVE 01/21/2022 0949   BILIRUBINUR NEGATIVE 01/21/2022 0949   BILIRUBINUR Negative 03/09/2021 0852   KETONESUR NEGATIVE 01/21/2022 0949   PROTEINUR NEGATIVE 01/21/2022 0949   UROBILINOGEN 0.2 03/09/2021 0852   UROBILINOGEN 0.2 03/10/2009 0801   NITRITE NEGATIVE 01/21/2022 0949   LEUKOCYTESUR MODERATE (A) 01/21/2022 0949    Radiological Exams on Admission: DG Chest Port 1 View  Result Date: 02/12/2022 CLINICAL DATA:  Shortness of breath, mid chest tightness. EXAM: PORTABLE CHEST 1 VIEW COMPARISON:  01/19/2022 and CT chest 01/25/2022. FINDINGS: Trachea is midline. Heart size stable. Mild interstitial prominence and indistinctness bilaterally. Left lower lobe volume loss.  There may be a left pleural effusion. IMPRESSION: Mild congestive heart failure.  Left lower lobe atelectasis. Electronically Signed   By: Lorin Picket M.D.   On: 02/12/2022 14:54    EKG: Independently reviewed. Rapid a-fib  Assessment/Plan Principal Problem:   CHF (congestive heart failure) (HCC) Active Problems:   Acute congestive heart failure ([place])  (please populate well all problems here in Problem List. (For example, if patient is on BP meds at home and you resume or decide to hold them, it is a problem that needs to be her. Same for CAD, COPD, HLD and so on)  Acute on chronic diastolic CHF decompensation -Received one dose of 80 mg IV Lasix in ED, now BP on the lower side.  She is still fluid overload but no significant hypoxia, no redosed Lasix today.  Discussed with cardiology Dr. Virgina Jock.  A-fib with RVR -Cardiology will arrange cardioversion for tomorrow. -As per cardio, will not use digoxin for concerns about bradycardia. Cardiology gave one dose of Cardizem PO, will use PRN lopressor for rate control now. -Continue Eliquis  Frequent PVCs -PRN lopressor -One dose of IV Mag given  Angina  -Likely from uncontrolled afib and CHF decompensation, treat underlying course. Given her age and CKD, prefer conservative management.  Worsening of baseline chronic iron deficiency anemia -Denied any abdominal pain, no black stool. -Check iron study and Reticular cell count, may need EPO  B/L leg ulcers, POA -Secondary to fluid overload, diuresis when BP more stable -Wound care consulted.  CKD stage IV -Significantly overloaded, Cre level stable. -Diuresis when BP more stable.  IDDM -Sliding scale for now.  Morbid obesity -Calorie control recommended.   DVT prophylaxis: Eliquis Code Status: DNR Family Communication:  None at bedside Disposition Plan: Expect more than 2 midnight hospital stay, patient is sick with CHF and uncontrolled afib Consults called:  Cardiology Admission status: Tele admit   Lequita Halt MD [facility] Pager [phone]  02/12/2022, 3:50 PM

## 2022-02-12 NOTE — Telephone Encounter (Signed)
Received fax from pharmacy to refill amlodipine and glipizide. Looks like they were discontinued at Discharge from hospital. Do you want me to refill Rx

## 2022-02-12 NOTE — Telephone Encounter (Signed)
Spoke with [facility] with [facility], pt will be going to the ER via EMS.

## 2022-02-12 NOTE — Telephone Encounter (Signed)
[name] with [facility] called and sated that she has SOB due to CHF. Pt is weak and they are unsure if it is because she did not do her nebulizer solution or if it was because of the long office visit. SOB is worse, and she states she can hear her lungs sound like they have fluid. Pt was wheezing. O2 is in the 70's when she gets up to use the bathroom. Pt is having chest tightness. The nurse recommends the hospital but the family wants your advice. Pt is on 4 liters of oxygen currently. Pt was in abnormal rhythm Friday and today. Still experiencing chest pain. Please advise.

## 2022-02-12 NOTE — ED Notes (Signed)
Attending Zhang MD notified of pt HR.

## 2022-02-12 NOTE — Telephone Encounter (Signed)
Glenard Haring wanted to inform lowne that the pt now has a more persistent dry cough, pt's weight is reflecting as 293 with breakfast and 289 without.  Pt is feeling an abnormal rhythm, pt has a follow up scheduled. Pt stated that her shortness of breathe is increasing, chest pain, dropping within the 70's with oxygen 4L, chest pain 10/10 when dropping within the 70's, taking longer than normal to recover to get back within the 90's she has to sit for awhile for about 5 minutes. No chest pain currently, just when oxygen drops. [phone].

## 2022-02-12 NOTE — ED Notes (Signed)
Dinner tray ordered.

## 2022-02-12 NOTE — Consult Note (Signed)
CARDIOLOGY CONSULT NOTE  Patient ID: Katelyn Mahoney MRN: [id number] DOB/AGE: 05/06/1934 88 y.o.  Admit date: 02/12/2022 Referring Physician: [facility] Reason for Consultation:  Afib, CHF  HPI:   88 year old Caucasian female with hypertension, type II Mellitus, obesity, persistent A-fib diagnosed 11/2021 during hospitalization for acute hypoxic respiratory failure due to pneumonia and HFpEF, possible, subsequent AKI, acute heart failure exacerbation.  Patient was seen by me in office on 02/08/2022.  I felt her respiratory failure was multifactorial, including HFpEF, obesity, deconditioning, worsening AKI, as well as persistent A-fib.  I recommended outpatient cardioversion for her atrial fibrillation.  Today, patient had worsening of her dyspnea and was found to be hypoxic in 70s at home on 4 L oxygen.  She was brought to emergency room.  She has been in A-fib RVR with rates up to 120s.  Chest x-ray showed mild congestive heart failure, left lower lobe atelectasis.  Past Medical History:  Diagnosis Date   Allergy    Anxiety    Arthritis    Arthritis    Callus    Depression    Diabetes mellitus without complication ([place])    Hyperlipidemia    Hypertension      Past Surgical History:  Procedure Laterality Date   ABDOMINAL HYSTERECTOMY     CHOLECYSTECTOMY     COLONOSCOPY WITH PROPOFOL N/A 01/12/2022   Procedure: COLONOSCOPY WITH PROPOFOL;  Surgeon: Carol Ada, MD;  Location: [place];  Service: Endoscopy;  Laterality: N/A;   ESOPHAGOGASTRODUODENOSCOPY N/A 01/10/2022   Procedure: ESOPHAGOGASTRODUODENOSCOPY (EGD);  Surgeon: Carol Ada, MD;  Location: [place];  Service: Endoscopy;  Laterality: N/A;   HEMOSTASIS CLIP PLACEMENT  01/12/2022   Procedure: HEMOSTASIS CLIP PLACEMENT;  Surgeon: Carol Ada, MD;  Location: [place];  Service: Endoscopy;;   HOT HEMOSTASIS N/A 01/10/2022   Procedure: HOT HEMOSTASIS (ARGON PLASMA COAGULATION/BICAP);  Surgeon: Carol Ada, MD;   Location: [place];  Service: Endoscopy;  Laterality: N/A;   HOT HEMOSTASIS N/A 01/12/2022   Procedure: HOT HEMOSTASIS (ARGON PLASMA COAGULATION/BICAP);  Surgeon: Carol Ada, MD;  Location: [place];  Service: Endoscopy;  Laterality: N/A;   SPLENECTOMY        Family History  Problem Relation Age of Onset   Cancer Mother    Diabetes Mother    Cancer Father    Diabetes Father    Cancer Sister    Diabetes Sister    Cancer Brother    Brain cancer Brother    Diabetes Brother    Diabetes Maternal Grandmother    Diabetes Paternal Grandmother    Cancer - Lung Brother    Diabetes Brother      Social History: Social History   Socioeconomic History   Marital status: Widowed    Spouse name: Not on file   Number of children: 1   Years of education: 40   Highest education level: 12th grade  Occupational History   Not on file  Tobacco Use   Smoking status: Never    Passive exposure: Never   Smokeless tobacco: Never  Vaping Use   Vaping Use: Never used  Substance and Sexual Activity   Alcohol use: No    Alcohol/week: 0.0 standard drinks   Drug use: No   Sexual activity: Never    Birth control/protection: Abstinence  Other Topics Concern   Not on file  Social History Narrative   Not on file   Social Determinants of Health   Financial Resource Strain: Low Risk    Difficulty of Paying  Living Expenses: Not very hard  Food Insecurity: No Food Insecurity   Worried About [place] in the Last Year: Never true   Ran Out of Food in the Last Year: Never true  Transportation Needs: No Transportation Needs   Lack of Transportation (Medical): No   Lack of Transportation (Non-Medical): No  Physical Activity: Inactive   Days of Exercise per Week: 0 days   Minutes of Exercise per Session: 0 min  Stress: No Stress Concern Present   Feeling of Stress : Only a little  Social Connections: Moderately Isolated   Frequency of Communication with Friends and Family:  More than three times a week   Frequency of Social Gatherings with Friends and Family: More than three times a week   Attends Religious Services: 1 to 4 times per year   Active Member of [employer] or Organizations: No   Attends Archivist Meetings: Never   Marital Status: Widowed  Human resources officer Violence: Not At Risk   Fear of Current or Ex-Partner: No   Emotionally Abused: No   Physically Abused: No   Sexually Abused: No     (Not in a hospital admission)   Review of Systems  Cardiovascular:  Positive for dyspnea on exertion and leg swelling. Negative for chest pain, palpitations and syncope.  Respiratory:  Positive for shortness of breath.      Physical Exam: Physical Exam Vitals and nursing note reviewed.  Constitutional:      General: She is not in acute distress. Neck:     Vascular: No JVD.  Cardiovascular:     Rate and Rhythm: Tachycardia present. Rhythm irregular.     Heart sounds: Normal heart sounds. No murmur heard. Pulmonary:     Effort: Pulmonary effort is normal.     Breath sounds: Normal breath sounds. No wheezing or rales.  Musculoskeletal:     Right lower leg: Edema present.     Left lower leg: Edema present.       Lab Results: Reviewed and interpreted: CBC, BMP  Imaging/tests reviewed and independently interpreted: Chest x-ray 02/12/2022: Mild congestive heart failure.  Left lower lobe atelectasis.    Cardiac Studies:  EKG 02/08/2022: Atrial fibrillation 114 bpm Frequent PVCs  Nonspecific T-abnormality  Low voltage   Echocardiogram 01/22/2022:  1. ? left pleural effusion.   2. Left ventricular ejection fraction, by estimation, is 50 to 55%. The  left ventricle has low normal function. The left ventricle has no regional  wall motion abnormalities. Left ventricular diastolic parameters are  indeterminate.   3. Right ventricular systolic function is mildly reduced. The right  ventricular size is mildly enlarged.   4. Left atrial size  was moderately dilated.   5. Right atrial size was moderately dilated.   6. The mitral valve is abnormal. Moderate mitral valve regurgitation. No  evidence of mitral stenosis.   7. Tricuspid valve regurgitation is moderate.   8. The aortic valve is tricuspid. There is moderate calcification of the  aortic valve. There is moderate thickening of the aortic valve. Aortic  valve regurgitation is trivial. Mild aortic valve stenosis.   9. The inferior vena cava is dilated in size with >50% respiratory  variability, suggesting right atrial pressure of 8 mmHg.    Echocardiogram 12/13/2021:  1. Left ventricular ejection fraction, by estimation, is 50 to 55%. The  left ventricle has low normal function. The left ventricle has no regional  wall motion abnormalities. Left ventricular diastolic parameters are  consistent  with Grade II diastolic  dysfunction (pseudonormalization).   2. Right ventricular systolic function is normal. The right ventricular  size is normal. There is moderately elevated pulmonary artery systolic  pressure. The estimated right ventricular systolic pressure is 88.4 mmHg.   3. A small pericardial effusion is present. The pericardial effusion is  circumferential.   4. The mitral valve is degenerative. Moderate mitral valve regurgitation.  No evidence of mitral stenosis.   5. The aortic valve is tricuspid. There is mild calcification of the  aortic valve. There is mild thickening of the aortic valve. Aortic valve  regurgitation is trivial. Aortic valve sclerosis/calcification is present,  without any evidence of aortic  stenosis.   6. The inferior vena cava is dilated in size with <50% respiratory  variability, suggesting right atrial pressure of 15 mmHg.    CT Chest 01/25/2022: Small bilateral pleural effusions are noted with adjacent subsegmental atelectasis.   7 mm subpleural nodule seen in right upper lobe. Non-contrast chest CT at 6-12 months is recommended. If the  nodule is stable at time of repeat CT, then future CT at 18-24 months (from today's scan) is considered optional for low-risk patients, but is recommended for high-risk patients. This recommendation follows the consensus statement: Guidelines for Management of Incidental Pulmonary Nodules Detected on CT Images: From the Fleischner Society 2017; Radiology 2017; 284:228-243.   Mild mosaic pattern is noted throughout both lungs suggesting air trapping secondary to small airways disease or potentially mild multifocal inflammation.   Mild coronary calcifications are noted.   Aortic Atherosclerosis (ICD10-I70.0).  Assessment & Recommendations:  88 year old Caucasian female with hypertension, type 2 diabetes mellitus, obesity, persistent A-fib, progressive CKD, admitted with acute hypoxic respiratory failure, acute on chronic HFpEF  Acute on chronic hypoxic respiratory failure: Likely multifactorial, including progression of chronic respiratory failure due to HFpEF, obesity, deconditioning, worsening AKI A-fib certainly contributing, see below.  Persistent A-fib: Rate around 110 bpm. BP fairly normal, barring one or two outliers.  While rate is not too high, I do think Afib is contributing to her worsening HFpEF We discussed inpatient cardioversion. Patient is agreeable. Keep NPO after midnight. I would avoid digoxin just before the cardioversion to avoid significant bradycardia.  I would like to avoid IV diltiazem to avoid additional volume. Instead, I have ordered an additional dose of PO diltiazem 120 mg daily, in addition to her morning dose of 120 mg daily, just for today. CHA2DS2VASc score high.  Continue Eliquis 2.5 mg twice daily.  Fortunately, she is tolerating eliquis without any major bleeding issues. EGD performed in 12/2021 for heme positive stools only showed gastritis.    Rest of the medical management as per the primary team.  Discussed interpretation of tests and  management recommendations with th primary team     Nigel Mormon, MD Pager: [phone] Office: [phone]

## 2022-02-12 NOTE — Chronic Care Management (AMB) (Signed)
Chronic Care Management    Clinical Social Work Note  02/12/2022 Name: Katelyn Mahoney MRN: [id number] DOB: 02-25-1934  Katelyn Mahoney is a 88 y.o. year old female who is a primary care patient of Ann Held, DO. The CCM team was consulted to assist the patient with chronic disease management and/or care coordination needs related to: [employer] and Level of Care Concerns.   Engaged with patient by telephone for initial visit in response to provider referral for social work chronic care management and care coordination services.   Consent to Services:  The patient was given information about Chronic Care Management services, agreed to services, and gave verbal consent prior to initiation of services.  Please see initial visit note for detailed documentation.   Patient agreed to services and consent obtained.   Assessment: Review of patient past medical history, allergies, medications, and health status, including review of relevant consultants reports was performed today as part of a comprehensive evaluation and provision of chronic care management and care coordination services.     SDOH (Social Determinants of Health) assessments and interventions performed:  SDOH Interventions    Flowsheet Row Most Recent Value  SDOH Interventions   Food Insecurity Interventions Intervention Not Indicated  Financial Strain Interventions Intervention Not Indicated  Housing Interventions Intervention Not Indicated  Intimate Partner Violence Interventions Intervention Not Indicated  Physical Activity Interventions Patient Refused  Stress Interventions Intervention Not Indicated  Social Connections Interventions Intervention Not Indicated  Transportation Interventions Intervention Not Indicated        Advanced Directives Status: See Care Plan for related entries.  CCM Care Plan  Allergies  Allergen Reactions   Bupropion Hcl Rash    Palmar rash   Penicillins Rash     rash   Citalopram Hydrobromide Other (See Comments)    cough   Metformin And Related Diarrhea   Blue Dyes (Parenteral) Rash   [place] Rash   Gold-Containing Drug Products Rash   Nickel Rash   Zinc Rash    No facility-administered encounter medications on file as of 02/09/2022.   Outpatient Encounter Medications as of 02/09/2022  Medication Sig Note   acetaminophen (TYLENOL) 650 MG CR tablet 650 mg every 8 (eight) hours.    albuterol (VENTOLIN HFA) 108 (90 Base) MCG/ACT inhaler Inhale 2 puffs into the lungs every 6 (six) hours as needed for wheezing or shortness of breath.    apixaban (ELIQUIS) 2.5 MG TABS tablet Take 1 tablet (2.5 mg total) by mouth 2 (two) times daily.    Bilberry, Vaccinium myrtillus, (BILBERRY PO) Take 1 tablet by mouth daily.    diltiazem (CARDIZEM CD) 120 MG 24 hr capsule Take 1 capsule (120 mg total) by mouth daily.    fluocinonide cream (LIDEX) 3.64 % Apply 1 application topically daily as needed (breakouts).    gabapentin (NEURONTIN) 100 MG capsule TAKE 1 CAPSULE BY MOUTH 3 TIMES DAILY (Patient taking differently: Take 100 mg by mouth 3 (three) times daily.)    Glucosamine HCl (GLUCOSAMINE PO) Take 1 tablet by mouth daily.    insulin lispro (HUMALOG KWIKPEN) 100 UNIT/ML KwikPen Inject 6 Units into the skin with breakfast, with lunch, and with evening meal. Inject 7-9 units under the skin three times daily before meals. E11.65 02/12/2022: Pt had 12 units   Insulin Pen Needle 32G X 4 MM MISC Use on insulin pens 4 times a day    ipratropium-albuterol (DUONEB) 0.5-2.5 (3) MG/3ML SOLN Take 3 mLs by nebulization every 6 (six) hours as  needed.    iron polysaccharides (NU-IRON) 150 MG capsule Take 1 capsule (150 mg total) by mouth daily.    Loratadine 10 MG CAPS Take 10 mg by mouth daily.    metoprolol tartrate (LOPRESSOR) 25 MG tablet Take 1 tablet (25 mg total) by mouth 2 (two) times daily.    multivitamin-lutein (OCUVITE-LUTEIN) CAPS capsule Take 1 capsule by mouth daily.     NONFORMULARY OR COMPOUNDED ITEM Lift chair  #1   Dx osteoarthritis knees    OneTouch Delica Lancets 85F MISC Use to test blood sugar once daily- E11.65    ONETOUCH VERIO test strip USE TO TEST BLOOD SUGAR    TWICE A DAY    OZEMPIC, 1 MG/DOSE, 4 MG/3ML SOPN INJECT 1MG  SUBCUTANEOUSLY  WEEKLY (EVERY 7 DAYS) (Patient taking differently: Inject 1 mg into the skin every Saturday.)    pantoprazole (PROTONIX) 40 MG tablet Take 1 tablet (40 mg total) by mouth daily.    potassium chloride SA (KLOR-CON M) 20 MEQ tablet Take 2 tablets (40 mEq total) by mouth daily.    sertraline (ZOLOFT) 100 MG tablet TAKE 1 TABLET DAILY (Patient taking differently: Take 100 mg by mouth daily.)    sodium chloride (OCEAN) 0.65 % SOLN nasal spray Place 1 spray into both nostrils as needed for congestion.    Tiotropium Bromide Monohydrate (SPIRIVA RESPIMAT) 2.5 MCG/ACT AERS Inhale 1 puff into the lungs daily.    torsemide 60 MG TABS Take 60 mg by mouth 2 (two) times daily.    TOUJEO SOLOSTAR 300 UNIT/ML Solostar Pen INJECT 34 UNITS            SUBCUTANEOUSLY DAILY (Patient taking differently: Inject 34 Units into the skin daily.)    [DISCONTINUED] atorvastatin (LIPITOR) 20 MG tablet TAKE 1/2 TABLET ON MONDAY, WEDNESDAY, FRIDAY AND      SUNDAYS (Patient taking differently: Take 10 mg by mouth See admin instructions. TAKE 1/2 TABLET ON MONDAY, WEDNESDAY, FRIDAY AND      SUNDAYS)     Patient Active Problem List   Diagnosis Date Noted   CHF (congestive heart failure) ([place]) 02/12/2022   Acute on chronic respiratory failure with hypoxia (HCC) 02/08/2022   Gastrointestinal hemorrhage    Persistent atrial fibrillation (H[facility])    Acute exacerbation of CHF (congestive heart failure) ([place]) 01/09/2022   Atrial fibrillation with RVR (HCC)    Acute congestive heart failure ([place])    Urinary tract infection 12/13/2021   AKI (acute kidney injury) ([place]) 12/13/2021   At risk for sepsis 12/13/2021   Primary osteoarthritis of both knees  03/09/2021   Urinary frequency 03/09/2021   Depression, major, single episode, in partial remission ([place]) 03/09/2021   Edema of lower extremity 08/20/2017   Hyperlipidemia 08/20/2017   CKD stage G3b/A1, GFR 30-44 and albumin creatinine ratio <30 mg/g (HCC) 02/27/2017   Allergic urticaria 08/02/2016   Fungal dermatitis 11/08/2015   Right leg pain 10/14/2015   Physical exam 11/09/2014   Dermatophytosis of nail 09/24/2013   Arthritis    Claw toe (acquired) 04/17/2013   Hammer toe, acquired 04/17/2013   Spherocytosis, hereditary ([place]) 08/30/2011   Diabetes mellitus ([place]) 10/17/2010   MORBID OBESITY 03/19/2008   Primary hypertension 03/19/2008   ROTATOR CUFF SPRAIN AND STRAIN 03/19/2008   Hyperlipidemia associated with type 2 diabetes mellitus ([place]) 09/05/2007   Depression 09/05/2007   ROSACEA 05/19/2007    Conditions to be addressed/monitored: CHF, Type II Diabetes Mellitus, and Morbid Obesity.  Limited Social Support, Level of Care Concerns, ADL/IADL  Limitations, Social Isolation, Limited Access to Caregiver, Memory Deficits, and Lacks Knowledge of [employer].  Care Plan : LCSW Plan of Care  Updates made by Francis Gaines, LCSW since 02/12/2022 12:00 AM     Problem: Obtain Skilled Nursing Placement for [employer].   Priority: High     Goal: Obtain Skilled Nursing Placement for [employer].   Start Date: 02/09/2022  Expected End Date: 05/04/2022  This Visit's Progress: On track  Priority: High  Note:   Current Barriers:  Chronic Disease Management Support, Education, Resources, Referrals, Advocacy, and Care Coordination Needs related to Acute on Chronic Respiratory Failure with Hypoxia, Hypertension, Acute Exacerbation of Congestive Heart Failure, Type II Diabetes Mellitus, Stage III Chronic Kidney Disease, and Major Depression, Single Episode, in Partial Remission. Inability to consistently perform ADL's/IADL's  independently. Clinical Goal(s):  Patient will work with LCSW, in an effort to identify and address any acute and chronic care coordination needs related to the self-health management of Acute on Chronic Respiratory Failure with Hypoxia, Hypertension, Acute Exacerbation of Congestive Heart Failure, Type II Diabetes Mellitus, Stage III Chronic Kidney Disease, and Major Depression, Single Episode, in Partial Remission. Patient desires to receive short-term inpatient rehabilitative services in a skilled nursing facility, and will work with LCSW to identify options.   Interventions: Thorough chart review performed and appropriate assessments completed. Inter-disciplinary care team collaboration (see longitudinal plan of care). Collaboration with Primary Care Physician, Dr. Roma Schanz regarding development and update of comprehensive plan of care as evidenced by provider attestation and co-signature. Collaboration with Primary Care Physician, Dr. Roma Schanz to request completed and signed FL-2 Form. Once completed and signed FL-2 Form is obtained, LCSW will fax to all skilled nursing facilities of interest, to try and pursue bed offers. LCSW Interventions:  Supportive Counseling and Attentive Listening Skills were utilized when patient voiced concerns about not being able to consistently perform ADL's/IADL's.   Collaboration with admissions coordinator at [facility] in [place], [place], to check female bed availability.   Collaboration with home health physical and occupational therapists with [facility], to request therapy notes and recommendations. Collaboration with representative from [facility], to provide FL-2 Form and therapy notes, to obtain prior approval and authorization for short-term skilled nursing facility placement.  LCSW provided patient with a list of skilled nursing facilities within a 50-mile radius of her home address.   Patient Goals/Self-Care Activities: Receive bi-weekly calls from LCSW, in an effort to coordinate skilled nursing facility placement for short-term rehabilitative services. Review list of skilled nursing facilities, within a 50-mile radius of your home address, and be prepared to provide LCSW with at least 3 facilities of interest during our next scheduled telephone outreach call. LCSW will fax completed and signed FL-2 Form to all facilities of interest, including [facility] in [place], [place] [phone]), once obtained from Primary Care Physician, Dr. Roma Schanz. Continue to receive home health physical and occupational therapy services, twice per week, through [facility]. Contact LCSW directly (# [phone]), if you have questions, need assistance, or if additional social work needs are identified.   Follow-Up Plan:  LCSW will make contact with patient on 02/23/2022 at 9:00 am.     [place] Worker [facility] [phone]

## 2022-02-12 NOTE — Telephone Encounter (Signed)
Spoke with [employer]. She states patient is going to the ED. EMS was on the way.

## 2022-02-12 NOTE — ED Notes (Signed)
Hospitalist at bedside 

## 2022-02-13 ENCOUNTER — Other Ambulatory Visit: Payer: Self-pay

## 2022-02-13 ENCOUNTER — Inpatient Hospital Stay (HOSPITAL_COMMUNITY): Payer: Medicare Other | Admitting: Anesthesiology

## 2022-02-13 ENCOUNTER — Encounter (HOSPITAL_COMMUNITY): Payer: Self-pay | Admitting: Internal Medicine

## 2022-02-13 ENCOUNTER — Encounter (HOSPITAL_COMMUNITY)
Admission: EM | Disposition: A | Discharge status: Hospice | Payer: Self-pay | Source: Home / Self Care | Attending: Internal Medicine

## 2022-02-13 ENCOUNTER — Inpatient Hospital Stay (HOSPITAL_COMMUNITY): Admission: RE | Admit: 2022-02-13 | Payer: Medicare Other | Source: Home / Self Care | Admitting: Cardiology

## 2022-02-13 DIAGNOSIS — N184 Chronic kidney disease, stage 4 (severe): Secondary | ICD-10-CM | POA: Diagnosis not present

## 2022-02-13 DIAGNOSIS — I4891 Unspecified atrial fibrillation: Secondary | ICD-10-CM

## 2022-02-13 DIAGNOSIS — Z794 Long term (current) use of insulin: Secondary | ICD-10-CM

## 2022-02-13 DIAGNOSIS — I130 Hypertensive heart and chronic kidney disease with heart failure and stage 1 through stage 4 chronic kidney disease, or unspecified chronic kidney disease: Secondary | ICD-10-CM

## 2022-02-13 DIAGNOSIS — E1122 Type 2 diabetes mellitus with diabetic chronic kidney disease: Secondary | ICD-10-CM

## 2022-02-13 DIAGNOSIS — I5033 Acute on chronic diastolic (congestive) heart failure: Secondary | ICD-10-CM | POA: Diagnosis not present

## 2022-02-13 DIAGNOSIS — I482 Chronic atrial fibrillation, unspecified: Secondary | ICD-10-CM | POA: Insufficient documentation

## 2022-02-13 DIAGNOSIS — I509 Heart failure, unspecified: Secondary | ICD-10-CM

## 2022-02-13 DIAGNOSIS — E1121 Type 2 diabetes mellitus with diabetic nephropathy: Secondary | ICD-10-CM

## 2022-02-13 HISTORY — PX: CARDIOVERSION: SHX1299

## 2022-02-13 LAB — GLUCOSE, CAPILLARY
Glucose-Capillary: 198 mg/dL — ABNORMAL HIGH (ref 70–99)
Glucose-Capillary: 176 mg/dL — ABNORMAL HIGH (ref 70–99)
Glucose-Capillary: 203 mg/dL — ABNORMAL HIGH (ref 70–99)
Glucose-Capillary: 267 mg/dL — ABNORMAL HIGH (ref 70–99)

## 2022-02-13 LAB — BASIC METABOLIC PANEL
Anion gap: 12 (ref 5–15)
BUN: 65 mg/dL — ABNORMAL HIGH (ref 8–23)
CO2: 33 mmol/L — ABNORMAL HIGH (ref 22–32)
Calcium: 9.3 mg/dL (ref 8.9–10.3)
Chloride: 96 mmol/L — ABNORMAL LOW (ref 98–111)
Creatinine, Ser: 2.88 mg/dL — ABNORMAL HIGH (ref 0.44–1.00)
GFR, Estimated: 16 mL/min — ABNORMAL LOW (ref >60)
Glucose, Bld: 222 mg/dL — ABNORMAL HIGH (ref 70–99)
Potassium: 4.2 mmol/L (ref 3.5–5.1)
Sodium: 141 mmol/L (ref 135–145)

## 2022-02-13 LAB — PROTIME-INR
INR: 1.5 — ABNORMAL HIGH (ref 0.8–1.2)
Prothrombin Time: 17.8 seconds — ABNORMAL HIGH (ref 11.4–15.2)

## 2022-02-13 LAB — IRON AND TIBC
Iron: 17 ug/dL — ABNORMAL LOW (ref 28–170)
Saturation Ratios: 4 % — ABNORMAL LOW (ref 10.4–31.8)
TIBC: 475 ug/dL — ABNORMAL HIGH (ref 250–450)
UIBC: 458 ug/dL

## 2022-02-13 LAB — TROPONIN I (HIGH SENSITIVITY): Troponin I (High Sensitivity): 15 ng/L (ref <18)

## 2022-02-13 LAB — RETICULOCYTES
Immature Retic Fract: 27.7 % — ABNORMAL HIGH (ref 2.3–15.9)
RBC.: 2.77 MIL/uL — ABNORMAL LOW (ref 3.87–5.11)
Retic Count, Absolute: 119.1 10*3/uL (ref 19.0–186.0)
Retic Ct Pct: 4.3 % — ABNORMAL HIGH (ref 0.4–3.1)

## 2022-02-13 LAB — FERRITIN: Ferritin: 38 ng/mL (ref 11–307)

## 2022-02-13 SURGERY — CARDIOVERSION
Anesthesia: General

## 2022-02-13 MED ORDER — ATORVASTATIN CALCIUM 10 MG PO TABS
20.0000 mg | ORAL_TABLET | ORAL | Status: DC
  Administered 2022-02-13 – 2022-02-24 (×6): 20 mg via ORAL
  Filled 2022-02-13 (×7): qty 2

## 2022-02-13 MED ORDER — DILTIAZEM HCL ER COATED BEADS 120 MG PO CP24
120.0000 mg | ORAL_CAPSULE | Freq: Every day | ORAL | Status: DC
Start: 2022-02-13 — End: 2022-03-02
  Administered 2022-02-13 – 2022-03-02 (×18): 120 mg via ORAL
  Filled 2022-02-13 (×18): qty 1

## 2022-02-13 MED ORDER — PROPOFOL 10 MG/ML IV BOLUS
INTRAVENOUS | Status: DC | PRN
Start: 2022-02-13 — End: 2022-02-13
  Administered 2022-02-13: 30 mg via INTRAVENOUS
  Administered 2022-02-13: 20 mg via INTRAVENOUS

## 2022-02-13 MED ORDER — METOPROLOL TARTRATE 25 MG PO TABS
25.0000 mg | ORAL_TABLET | Freq: Two times a day (BID) | ORAL | Status: DC
  Administered 2022-02-13 – 2022-02-25 (×24): 25 mg via ORAL
  Filled 2022-02-13 (×24): qty 1

## 2022-02-13 MED ORDER — FUROSEMIDE 10 MG/ML IJ SOLN
60.0000 mg | Freq: Two times a day (BID) | INTRAMUSCULAR | Status: DC
  Administered 2022-02-13 – 2022-02-15 (×4): 60 mg via INTRAVENOUS
  Filled 2022-02-13 (×4): qty 6

## 2022-02-13 NOTE — Progress Notes (Signed)
Remains in Afib. Rate around 110 bpm. Her respiratory failure is multifactorial with recent pneumonia, HFpEF, Afib, AKI, deconditioning. While Afib is likely contributing, I do not think it is the only factor. Cardioversion is still reasonable to restore sinus rhythm, but I do not expect overnight improvement in her condition after cardioversion. I have discussed this with the patient and her nieve (over the phone) Katelyn Mahoney.  Patient is DNR at baseline, but this will be rescinded for the cardioversion and re-instated thereafter.   Nigel Mormon, MD Pager: [phone] Office: [phone]

## 2022-02-13 NOTE — Progress Notes (Signed)
Progress Note   Patient: Katelyn Mahoney BJS:[id number] DOB: 1934/12/19 DOA: 02/12/2022     1 DOS: the patient was seen and examined on 02/13/2022   Brief hospital course: Katelyn Mahoney was admitted to the hospital with the working diagnosis acute decompensated heart failure.   88 yo female with the past medical history of diastolic heart failure, CKD stage IV, atrial fibrillation, T2DM, obesity class 3, and chronic anemia. Reported worsening lower extremity edema, with worsening dyspnea on exertion, and oxygen desaturation down to 80%. Positive palpitations, and chest pressure. Symptoms worsening despite increased dose of oral diuretic therapy at home. On the day of her hospitalization she was found in respiratory distress by home nurse and patient was transported to the ED. On her initial physical examination her blood pressure was 125/75, HR 60, RR 21 and 02 saturation 95% on supplemental 02 per [place], lungs with rales bilaterally with increased work of breathing, and accessory muscle use. Heart with S1 and S2 present and tachycardic irregularly irregular with no gallops, abdomen soft and positive lower extremity edema.   Na 139, K 4,0, CL 97, bicarbonate 30, glucose 173, bun 66 and cr 2,6  Bnp 361 Wbc 11,1 hgb 8,0 hct 29,5 plt 507  SARS covid 19 negative   Chest radiograph with bilateral hilar vascular congestion, small left pleural effusion (personally reviewed).   EKG 125 bpm, with left axis deviation, atrial fibrillation with positive PVC and PAC, with no significant ST segment or T wave changes.   Patient was placed on furosemide for diuresis. 02/14 underwent cardioversion, converting to sinus rhythm   Assessment and Plan: * CHF (congestive heart failure) ([place]) Patient continue to have significant volume overload. Documented urine output only 350 ml. Echocardiogram from 12/2021 personally reviewed old records with EF LV 50 to 55%, with mild reduction on RV, moderate dilatation of  bilateral atriums, moderate tricuspid regurgitation. Unable to determinate PA pressures.   Plan to continue diuresis with IV furosemide to further target negative fluid balance.  Continue with metoprolol.  Close follow up on blood pressure.    Atrial fibrillation with RVR ([place])- (present on admission) Patient sp cardioversion, no back to sinus rhythm with rate in the 80 personally reviewed telemetry.  Plan to continue with metoprolol and diltiazem for rate control and continue anticoagulation with apixaban.   CKD (chronic kidney disease), stage IV ([place])- (present on admission) Renal function with serum cr at 2,88 with K at 4,2 and serum bicarbonate at 33. Patient continue with hypervolemia, continue diuresis with furosemide 60 mg IV q12. Follow renal function in am, including electrolytes with Mg.   Diabetes mellitus ([place]) Positive hyperglycemia, uncontrolled T2DM Continue with insulin sliding scale for glucose cover and monitoring Patient is tolerating po well..   Primary hypertension- (present on admission) Continue blood pressure monitoring Plan to continue with metoprolol and diltiazem Continue diuresis with furosemide.  Obesity, Class III, BMI 40-49.9 (morbid obesity) ([place])- (present on admission) Calculated BMI is 50.6        Subjective: Patient with persistent edema and dyspnea, no chest pain   Physical Exam: Vitals:   02/13/22 1144 02/13/22 1150 02/13/22 1151 02/13/22 1153  BP: (!) 121/57 136/70 136/70 (!) 149/70  Pulse: 83 84 84 84  Resp: 20 20 20 20   Temp: 98.2 F (36.8 C)     TempSrc: Temporal     SpO2: 100% 100% 100% 100%  Weight:      Height:       Neurology awake and alert ENT with  no pallor Cardiovascular with S1 and S2 present regular with extra beats, with no gallops or murmurs Neck with mild JVD Positive lower extremity edema +++ pitting  Abdomen protuberant but not tender   Data Reviewed:    Family Communication: no family at the  bedside   Disposition: Status is: Inpatient Remains inpatient appropriate because: heart failure      Planned Discharge Destination: Home   Author: Tawni Millers, MD 02/13/2022 3:36 PM  For on call review [email or web address].

## 2022-02-13 NOTE — Hospital Course (Addendum)
Katelyn Mahoney was admitted to the hospital with the working diagnosis acute decompensated heart failure.  Prolonged hospitalization due to slow diuresis related to acute kidney injury on chronic kidney disease.   88 yo female with the past medical history of diastolic heart failure, CKD stage IV, atrial fibrillation, T2DM, obesity class 3, and chronic anemia. Reported worsening lower extremity edema, with worsening dyspnea on exertion, and oxygen desaturation down to 80%. Positive palpitations, and chest pressure. Symptoms worsening despite increased dose of oral diuretic therapy at home. On the day of her hospitalization she was found in respiratory distress by home nurse and patient was transported to the ED. On her initial physical examination her blood pressure was 125/75, HR 60, RR 21 and 02 saturation 95% on supplemental 02 per [place], lungs with rales bilaterally with increased work of breathing, and accessory muscle use. Heart with S1 and S2 present and tachycardic irregularly irregular with no gallops, abdomen soft and positive lower extremity edema.   Na 139, K 4,0, CL 97, bicarbonate 30, glucose 173, bun 66 and cr 2,6  Bnp 361 Wbc 11,1 hgb 8,0 hct 29,5 plt 507  SARS covid 19 negative   Chest radiograph with bilateral hilar vascular congestion, small left pleural effusion (personally reviewed).   EKG 125 bpm, with left axis deviation, atrial fibrillation with positive PVC and PAC, with no significant ST segment or T wave changes.   Patient was placed on furosemide for diuresis. 02/14 underwent cardioversion, converting to sinus rhythm.  02/19 urinary tract infection treated with antibiotic therapy.  Patient continue volume overloaded and was offered cardiac catheterization that she has declined. Palliative care services have been consulted.   Prolonged hospitalization due to persistent edema, slowly improving. 02/26 with asterixis and 6 second sinus pause while sleeping.   Patient was  placed on lactulose with improvement of asterixis, likely liver failure due to congestive hepatopathy.  02/28 transitioned to oral torsemide.   Pending transfer to SNF.

## 2022-02-13 NOTE — Anesthesia Preprocedure Evaluation (Addendum)
Anesthesia Evaluation  Patient identified by MRN, date of birth, ID band Patient awake    Reviewed: Allergy & Precautions, NPO status , Patient's Chart, lab work & pertinent test results  History of Anesthesia Complications Negative for: history of anesthetic complications  Airway Mallampati: III  TM Distance: >3 FB Neck ROM: Full    Dental  (+) Dental Advisory Given   Pulmonary neg pulmonary ROS,    breath sounds clear to auscultation       Cardiovascular hypertension, Pt. on medications and Pt. on home beta blockers +CHF  + dysrhythmias Atrial Fibrillation  Rhythm:Irregular  1. ? left pleural effusion.  2. Left ventricular ejection fraction, by estimation, is 50 to 55%. The  left ventricle has low normal function. The left ventricle has no regional  wall motion abnormalities. Left ventricular diastolic parameters are  indeterminate.  3. Right ventricular systolic function is mildly reduced. The right  ventricular size is mildly enlarged.  4. Left atrial size was moderately dilated.  5. Right atrial size was moderately dilated.  6. The mitral valve is abnormal. Moderate mitral valve regurgitation. No  evidence of mitral stenosis.  7. Tricuspid valve regurgitation is moderate.  8. The aortic valve is tricuspid. There is moderate calcification of the  aortic valve. There is moderate thickening of the aortic valve. Aortic  valve regurgitation is trivial. Mild aortic valve stenosis.  9. The inferior vena cava is dilated in size with >50% respiratory  variability, suggesting right atrial pressure of 8 mmHg.    Neuro/Psych PSYCHIATRIC DISORDERS Anxiety Depression negative neurological ROS     GI/Hepatic negative GI ROS, Neg liver ROS,   Endo/Other  diabetesMorbid obesityLab Results      Component                Value               Date                      HGBA1C                   6.2                 02/02/2022              Renal/GU CRFRenal diseaseLab Results      Component                Value               Date                      CREATININE               2.88 (H)            02/13/2022                Musculoskeletal  (+) Arthritis ,   Abdominal   Peds  Hematology  (+) Blood dyscrasia, anemia , Lab Results      Component                Value               Date                      WBC  11.1 (H)            02/12/2022                HGB                      8.0 (L)             02/12/2022                HCT                      29.5 (L)            02/12/2022                MCV                      105.7 (H)           02/12/2022                PLT                      507 (H)             02/12/2022             eliquis   Anesthesia Other Findings   Reproductive/Obstetrics                            Anesthesia Physical Anesthesia Plan  ASA: 3  Anesthesia Plan: General   Post-op Pain Management:    Induction: Intravenous  PONV Risk Score and Plan: 3 and Treatment may vary due to age or medical condition  Airway Management Planned: Mask  Additional Equipment: None  Intra-op Plan:   Post-operative Plan:   Informed Consent: I have reviewed the patients History and Physical, chart, labs and discussed the procedure including the risks, benefits and alternatives for the proposed anesthesia with the patient or authorized representative who has indicated his/her understanding and acceptance.     Dental advisory given  Plan Discussed with: CRNA and Anesthesiologist  Anesthesia Plan Comments:         Anesthesia Quick Evaluation

## 2022-02-13 NOTE — Consult Note (Addendum)
WOC Nurse Consult Note: Reason for Consult: LE wounds Wound type:no wounds Continue Unna's boots for edema management, plans for compression stockings per HHRN once edema has improved and stabilized.  Which is very accurate treatment plan.   Notified orthopedic tech to reapply Unna's boots, no wounds. Change weekly.  Continue silicone foam to the sacrum for POA Stage 2   Discussed POC with patient and bedside nurse.  Re consult if needed, will not follow at this time. Thanks  Luian Schumpert [employer], RN,CWOCN, CNS, [place] [phone])

## 2022-02-13 NOTE — Plan of Care (Signed)
  Problem: Education: Goal: Ability to demonstrate management of disease process will improve Outcome: Progressing Goal: Ability to verbalize understanding of medication therapies will improve Outcome: Progressing   Problem: Activity: Goal: Capacity to carry out activities will improve Outcome: Progressing   Problem: Cardiac: Goal: Ability to achieve and maintain adequate cardiopulmonary perfusion will improve Outcome: Progressing   

## 2022-02-13 NOTE — Progress Notes (Signed)
Mobility Specialist Progress Note:   02/13/22 1453  Mobility  Activity Refused mobility   Pt tired from cardioversion and not wanting to ambulate today.   [facility] Public librarian Phone [phone]

## 2022-02-13 NOTE — Progress Notes (Signed)
Heart Failure Navigator Progress Note  Assessed for Heart & Vascular TOC clinic readiness.  Patient follows with [facility] and Dr. Virgina Jock. Plan for DCCV this admission. Third hospitalization for CHF related symptoms in 6 months.  Navigator available for educational resources.   Pricilla Holm, MSN, RN Heart Failure Nurse Navigator [phone]

## 2022-02-13 NOTE — Assessment & Plan Note (Addendum)
Continue with metoprolol and diltiazem, aggressive diuresis. Continue close blood pressure monitoring.

## 2022-02-13 NOTE — Progress Notes (Signed)
°   02/13/22 0757  Vitals  Temp 98 F (36.7 C)  Temp Source Oral  BP 140/62  MAP (mmHg) 85  BP Location Right Arm  BP Method Automatic  Patient Position (if appropriate) Lying  Pulse Rate (!) 105  Pulse Rate Source Monitor  ECG Heart Rate (!) 106  Resp 17  MEWS COLOR  MEWS Score Color Green  Oxygen Therapy  SpO2 97 %  O2 Device Nasal Cannula  O2 Flow Rate (L/min) 4 L/min  Pain Assessment  Pain Scale 0-10  Pain Score 0  MEWS Score  MEWS Temp 0  MEWS Systolic 0  MEWS Pulse 1  MEWS RR 0  MEWS LOC 0  MEWS Score 1   Patient is a chronic yellow MEWS at admission due to elevated HR. MD is aware and has scheduled cardioversion today at 1100.

## 2022-02-13 NOTE — Assessment & Plan Note (Signed)
Calculated BMI is 50.6

## 2022-02-13 NOTE — Progress Notes (Signed)
Orthopedic Tech Progress Note Patient Details:  Katelyn Mahoney [birth year] [id number]  Ortho Devices Type of Ortho Device: Haematologist Ortho Device/Splint Location: BLE Ortho Device/Splint Interventions: Ordered, Application, Adjustment   Post Interventions Patient Tolerated: Well Instructions Provided: Care of device  [name] 02/13/2022, 9:35 AM

## 2022-02-13 NOTE — Assessment & Plan Note (Addendum)
Uncontrolled hyperglycemia. Patient is tolerating po well, her fasting glucose today is 179.  Continue insulin therapy with 20 units basal and sliding scale for glucose cover and monitoring   On statin therapy.

## 2022-02-13 NOTE — Progress Notes (Signed)
CCMD notified RN that patient is having frequent PVCs, HR rising to low 100s. Patient is [facility] currently with oncoming RN. Informed oncoming RN informed.

## 2022-02-13 NOTE — Assessment & Plan Note (Addendum)
Echocardiogram from 12/2021 personally reviewed old records with EF LV 50 to 55%, with mild reduction on RV, moderate dilatation of bilateral atriums, moderate tricuspid regurgitation. Unable to determinate PA pressures.   Urine output documented only 400 ml over last 24 hrs. Blood pressure 412 mmHg systolic.   Her volume status has improved, she has no dyspnea and able to move better her legs.  Plan to transition to oral torsemide and continue with spironolactone and acetazolamide.  Holding on ARB or ARNI due to risk of worsening hypotension and renal failure.

## 2022-02-13 NOTE — Assessment & Plan Note (Addendum)
Hypokalemia  Slowly improving edema but not back to euvolemic.    Continue diuresis with torsemide, spironolactone and acetazolamide. Renal function with serum cr at 2,80 with K at 3.4 and serum bicarbonate at 36, Mg 1,9.   Add 40 Kcl today.  Follow up renal function in am.   Anemia of chronic renal disease, follow up hgb is 8,6 and hct 30.

## 2022-02-13 NOTE — Transfer of Care (Signed)
Immediate Anesthesia Transfer of Care Note  Patient: Katelyn Mahoney  Procedure(s) Performed: CARDIOVERSION  Patient Location: Endoscopy Unit  Anesthesia Type:General  Level of Consciousness: awake, drowsy and patient cooperative  Airway & Oxygen Therapy: Patient Spontanous Breathing and Patient connected to nasal cannula oxygen  Post-op Assessment: Report given to RN, Post -op Vital signs reviewed and stable and Patient moving all extremities X 4  Post vital signs: Reviewed and stable  Last Vitals:  Vitals Value Taken Time  BP    Temp    Pulse    Resp    SpO2      Last Pain:  Vitals:   02/13/22 1047  TempSrc: Oral  PainSc: 0-No pain         Complications: No notable events documented.

## 2022-02-13 NOTE — Progress Notes (Signed)
PT Cancellation Note  Patient Details Name: Ren Grasse MRN: [id number] DOB: [birth year]   Cancelled Treatment:    Reason Eval/Treat Not Completed: Patient at procedure or test/unavailable (Pt left for cardioversion just before PT arrived to room. Will check back at later date.)   [name] 02/13/2022, 10:48 AM [facility] M,PT Acute Rehab Services [phone] (pager)

## 2022-02-13 NOTE — TOC Progression Note (Addendum)
Transition of Care [facility]) - Progression Note    Patient Details  Name: Katelyn Mahoney MRN: [id number] Date of Birth: January 10, 1934  [facility]) CM/SW Contact  Zenon Mayo, RN Phone Number: 02/13/2022, 4:52 PM  Clinical Narrative:    from home, CHF, chronic oxygen  4 liters, , for cardioversion at 11:30, on eliquis pta, has unaboots, Has HHRN, HHPT per STaff RN, NCM will check into.Marland Kitchen TOC will continue to follow for dc needs.  2/15- Patient states she has [place] services with [facility].  NCM confirmed with Tommi Rumps, she has HHPT, will add  [place], [facility]          Expected Discharge Plan and Services                                                 Social Determinants of Health (SDOH) Interventions    Readmission Risk Interventions Readmission Risk Prevention Plan 01/26/2022  Transportation Screening Complete  PCP or Specialist Appt within 3-5 Days Complete  HRI or [place] Complete  Social Work Consult for [place] Planning/Counseling Complete  Palliative Care Screening Not Applicable  Medication Review Press photographer) Complete  Some recent data might be hidden

## 2022-02-13 NOTE — Progress Notes (Signed)
OT Cancellation Note  Patient Details Name: Katelyn Mahoney MRN: [id number] DOB: 1934-03-29   Cancelled Treatment:    Reason Eval/Treat Not Completed: Patient at procedure or test/ unavailable Joeseph Amor OTR/L  Acute Rehab Services  [phone] office number [phone] pager number   Joeseph Amor 02/13/2022, 10:38 AM

## 2022-02-13 NOTE — Interval H&P Note (Signed)
History and Physical Interval Note:  02/13/2022 9:19 AM  Katelyn Mahoney  has presented today for surgery, with the diagnosis of AFIB.  The various methods of treatment have been discussed with the patient and family. After consideration of risks, benefits and other options for treatment, the patient has consented to  Procedure(s): CARDIOVERSION (N/A) as a surgical intervention.  The patient's history has been reviewed, patient examined, no change in status, stable for surgery.  I have reviewed the patient's chart and labs.  Questions were answered to the patient's satisfaction.     [place]

## 2022-02-13 NOTE — Assessment & Plan Note (Addendum)
Sp cardioversion DC.  Patient with bradycardia, she had a 5 second pause while sleeping reported per telemetry 02/25/22/   Patient tolerating well metoprolol and diltiazem. Telemetry personally reviewed, HR in the 90, with positive PVC Continue with Cpap Anticoagulation with apixaban. Continue with telemetry monitoring.

## 2022-02-13 NOTE — CV Procedure (Signed)
Direct current cardioversion:  Indication symptomatic: Symptomatic atrial fibrillation  Procedure: Under deep sedation administered and monitored by anesthesiology, synchronized direct current cardioversion performed. Patient was delivered with 120, 150 Joules of electricity X 2 with success to NSR. Patient tolerated the procedure well. No immediate complication noted.   Nigel Mormon, MD [facility]. [place] Pager: [phone] Office: [phone] If no answer Cell [phone]

## 2022-02-14 ENCOUNTER — Encounter (HOSPITAL_COMMUNITY): Payer: Self-pay | Admitting: Cardiology

## 2022-02-14 DIAGNOSIS — I4819 Other persistent atrial fibrillation: Secondary | ICD-10-CM | POA: Diagnosis not present

## 2022-02-14 DIAGNOSIS — R911 Solitary pulmonary nodule: Secondary | ICD-10-CM | POA: Insufficient documentation

## 2022-02-14 DIAGNOSIS — I5033 Acute on chronic diastolic (congestive) heart failure: Secondary | ICD-10-CM | POA: Diagnosis not present

## 2022-02-14 LAB — CBC
HCT: 28.3 % — ABNORMAL LOW (ref 36.0–46.0)
Hemoglobin: 7.8 g/dL — ABNORMAL LOW (ref 12.0–15.0)
MCH: 28.8 pg (ref 26.0–34.0)
MCHC: 27.6 g/dL — ABNORMAL LOW (ref 30.0–36.0)
MCV: 104.4 fL — ABNORMAL HIGH (ref 80.0–100.0)
Platelets: 469 10*3/uL — ABNORMAL HIGH (ref 150–400)
RBC: 2.71 MIL/uL — ABNORMAL LOW (ref 3.87–5.11)
RDW: 16.4 % — ABNORMAL HIGH (ref 11.5–15.5)
WBC: 10.3 10*3/uL (ref 4.0–10.5)
nRBC: 1.1 % — ABNORMAL HIGH (ref 0.0–0.2)

## 2022-02-14 LAB — BASIC METABOLIC PANEL
Anion gap: 11 (ref 5–15)
BUN: 64 mg/dL — ABNORMAL HIGH (ref 8–23)
CO2: 31 mmol/L (ref 22–32)
Calcium: 8.8 mg/dL — ABNORMAL LOW (ref 8.9–10.3)
Chloride: 97 mmol/L — ABNORMAL LOW (ref 98–111)
Creatinine, Ser: 2.45 mg/dL — ABNORMAL HIGH (ref 0.44–1.00)
GFR, Estimated: 19 mL/min — ABNORMAL LOW (ref >60)
Glucose, Bld: 188 mg/dL — ABNORMAL HIGH (ref 70–99)
Potassium: 3.8 mmol/L (ref 3.5–5.1)
Sodium: 139 mmol/L (ref 135–145)

## 2022-02-14 LAB — GLUCOSE, CAPILLARY
Glucose-Capillary: 189 mg/dL — ABNORMAL HIGH (ref 70–99)
Glucose-Capillary: 289 mg/dL — ABNORMAL HIGH (ref 70–99)
Glucose-Capillary: 220 mg/dL — ABNORMAL HIGH (ref 70–99)
Glucose-Capillary: 211 mg/dL — ABNORMAL HIGH (ref 70–99)

## 2022-02-14 LAB — MAGNESIUM: Magnesium: 2.6 mg/dL — ABNORMAL HIGH (ref 1.7–2.4)

## 2022-02-14 MED ORDER — SODIUM CHLORIDE 0.9 % IV SOLN
250.0000 mg | Freq: Every day | INTRAVENOUS | Status: AC
  Administered 2022-02-14 – 2022-02-17 (×4): 250 mg via INTRAVENOUS
  Filled 2022-02-14 (×4): qty 20

## 2022-02-14 MED ORDER — INSULIN GLARGINE-YFGN 100 UNIT/ML ~~LOC~~ SOLN
15.0000 [IU] | Freq: Every day | SUBCUTANEOUS | Status: DC
  Administered 2022-02-14 – 2022-02-18 (×5): 15 [IU] via SUBCUTANEOUS
  Filled 2022-02-14 (×5): qty 0.15

## 2022-02-14 NOTE — Progress Notes (Addendum)
PROGRESS NOTE    Katelyn Mahoney  URK:[id number] DOB: 09/01/1934 DOA: 02/12/2022 PCP: Ann Held, DO  Narrative [name] with history of persistent A-fib, diastolic CHF, type 2 diabetes mellitus, obesity, was brought to the ED with worsening dyspnea and hypoxia, in the ED she was in A-fib RVR with heart rate in the 120s, chest x-ray noted CHF, has been on 4L O2 since december -Admitted, started on diuretics, underwent cardioversion on 2/14  Subjective:still having difficulty breathing  Assessment & Plan  Acute on chronic hypoxic respiratory failure  acute on chronic diastolic CHF -Multifactorial respiratory failure secondary to CHF-worsened by A-fib, obesity,?  COPD (heavy 2ndary exposure to smoking-has been on 4L O2 for 1-77months -Continue IV Lasix today, continue metoprolol -Cardioverted to sinus rhythm yesterday -Appreciate cardiology input, monitor I's/O, daily weights, urine output likely not accurate yesterday -Ambulate, PT OT eval  Atrial fibrillation with RVR -Cardioverted to sinus rhythm yesterday, continue metoprolol and diltiazem, in sinus rhythm now -Continue apixaban  CKD (chronic kidney disease), stage IV ([place])- (present on admission) -Baseline creatinine around 1.6-2 -Creatinine trended up to 2.8 yesterday, now improving -Suspect cardiorenal, continue diuretics today, BMP in a.m.  Acute on chronic anemia -Baseline hemoglobin around 8, anemia panel in January with severe iron deficiency -Hemoglobin down to 7.8 today, add IV iron -Patient denies any overt bleeding  Obesity, Class III, BMI 40-49.9 (morbid obesity) ([place])- (present on admission) Calculated BMI is 50.6  Diabetes mellitus ([place]) -Uncontrolled, add Semglee, sliding scale insulin  DVT prophylaxis: Apixaban Code Status: DNR Family Communication: Discussed with patient in detail, no family at bedside Disposition Plan: Inpatient  Consultants:  Cardiology  Procedures: DC cardioversion  2/14  Antimicrobials:    Objective: Vitals:   02/14/22 0757 02/14/22 0800 02/14/22 1112 02/14/22 1200  BP:  (!) 106/57 (!) 104/56   Pulse:  85 81   Resp:  19 19   Temp:   98.5 F (36.9 C)   TempSrc:   Oral   SpO2: 92%  93% 91%  Weight:      Height:        Intake/Output Summary (Last 24 hours) at 02/14/2022 1507 Last data filed at 02/14/2022 1417 Gross per 24 hour  Intake 960 ml  Output 700 ml  Net 260 ml   Filed Weights   02/13/22 0500 02/13/22 1047 02/14/22 0331  Weight: 133.8 kg 133.8 kg 133.9 kg    Examination:  General exam: Obese chronically ill female sitting up in bed, AAOx3, no distress HEENT: Positive JVD CVS: S1-S2, regular rate rhythm Lungs: Fine bilateral rales Abdomen: Soft, nontender, bowel sounds present Extremities: 2+ edema Skin: No rashes Psychiatry: Judgement and insight appear normal. Mood & affect appropriate.     Data Reviewed:   CBC: Recent Labs  Lab 02/12/22 1419 02/14/22 0355  WBC 11.1* 10.3  NEUTROABS 7.4  --   HGB 8.0* 7.8*  HCT 29.5* 28.3*  MCV 105.7* 104.4*  PLT 507* 831*   Basic Metabolic Panel: Recent Labs  Lab 02/12/22 1419 02/13/22 0039 02/14/22 0355  NA 139 141 139  K 4.0 4.2 3.8  CL 97* 96* 97*  CO2 30 33* 31  GLUCOSE 173* 222* 188*  BUN 66* 65* 64*  CREATININE 2.66* 2.88* 2.45*  CALCIUM 9.3 9.3 8.8*  MG  --   --  2.6*   GFR: Estimated Creatinine Clearance: 22.9 mL/min (A) (by C-G formula based on SCr of 2.45 mg/dL (H)). Liver Function Tests: Recent Labs  Lab 02/12/22 1419  AST 19  ALT 16  ALKPHOS 70  BILITOT 0.8  PROT 6.5  ALBUMIN 3.1*   No results for input(s): LIPASE, AMYLASE in the last 168 hours. No results for input(s): AMMONIA in the last 168 hours. Coagulation Profile: Recent Labs  Lab 02/13/22 0039  INR 1.5*   Cardiac Enzymes: No results for input(s): CKTOTAL, CKMB, CKMBINDEX, TROPONINI in the last 168 hours. BNP (last 3 results) No results for input(s): PROBNP in the last 8760  hours. HbA1C: No results for input(s): HGBA1C in the last 72 hours. CBG: Recent Labs  Lab 02/13/22 1223 02/13/22 1625 02/13/22 2124 02/14/22 0606 02/14/22 1129  GLUCAP 176* 203* 267* 189* 289*   Lipid Profile: No results for input(s): CHOL, HDL, LDLCALC, TRIG, CHOLHDL, LDLDIRECT in the last 72 hours. Thyroid Function Tests: No results for input(s): TSH, T4TOTAL, FREET4, T3FREE, THYROIDAB in the last 72 hours. Anemia Panel: Recent Labs    02/13/22 0039  FERRITIN 38  TIBC 475*  IRON 17*  RETICCTPCT 4.3*   Urine analysis:    Component Value Date/Time   COLORURINE YELLOW 01/21/2022 0949   APPEARANCEUR HAZY (A) 01/21/2022 0949   LABSPEC 1.008 01/21/2022 0949   PHURINE 5.0 01/21/2022 0949   GLUCOSEU NEGATIVE 01/21/2022 0949   HGBUR NEGATIVE 01/21/2022 0949   BILIRUBINUR NEGATIVE 01/21/2022 0949   BILIRUBINUR Negative 03/09/2021 0852   KETONESUR NEGATIVE 01/21/2022 0949   PROTEINUR NEGATIVE 01/21/2022 0949   UROBILINOGEN 0.2 03/09/2021 0852   UROBILINOGEN 0.2 03/10/2009 0801   NITRITE NEGATIVE 01/21/2022 0949   LEUKOCYTESUR MODERATE (A) 01/21/2022 0949   Sepsis Labs: @LABRCNTIP (procalcitonin:4,lacticidven:4)  ) Recent Results (from the past 240 hour(s))  Resp Panel by RT-PCR (Flu A&B, Covid) Nasopharyngeal Swab     Status: None   Collection Time: 02/12/22  2:27 PM   Specimen: Nasopharyngeal Swab; Nasopharyngeal(NP) swabs in vial transport medium  Result Value Ref Range Status   SARS Coronavirus 2 by RT PCR NEGATIVE NEGATIVE Final    Comment: (NOTE) SARS-CoV-2 target nucleic acids are NOT DETECTED.  The SARS-CoV-2 RNA is generally detectable in upper respiratory specimens during the acute phase of infection. The lowest concentration of SARS-CoV-2 viral copies this assay can detect is 138 copies/mL. A negative result does not preclude SARS-Cov-2 infection and should not be used as the sole basis for treatment or other patient management decisions. A negative  result may occur with  improper specimen collection/handling, submission of specimen other than nasopharyngeal swab, presence of viral mutation(s) within the areas targeted by this assay, and inadequate number of viral copies(<138 copies/mL). A negative result must be combined with clinical observations, patient history, and epidemiological information. The expected result is Negative.  Fact Sheet for Patients:  EntrepreneurPulse.com.au  Fact Sheet for Healthcare Providers:  IncredibleEmployment.be  This test is no t yet approved or cleared by the [place] FDA and  has been authorized for detection and/or diagnosis of SARS-CoV-2 by FDA under an Emergency Use Authorization (EUA). This EUA will remain  in effect (meaning this test can be used) for the duration of the COVID-19 declaration under Section 564(b)(1) of the Act, 21 U.S.C.section 360bbb-3(b)(1), unless the authorization is terminated  or revoked sooner.       Influenza A by PCR NEGATIVE NEGATIVE Final   Influenza B by PCR NEGATIVE NEGATIVE Final    Comment: (NOTE) The Xpert Xpress SARS-CoV-2/FLU/RSV plus assay is intended as an aid in the diagnosis of influenza from Nasopharyngeal swab specimens and should not be used as a sole basis for treatment. Nasal  washings and aspirates are unacceptable for Xpert Xpress SARS-CoV-2/FLU/RSV testing.  Fact Sheet for Patients: EntrepreneurPulse.com.au  Fact Sheet for Healthcare Providers: IncredibleEmployment.be  This test is not yet approved or cleared by the [place] FDA and has been authorized for detection and/or diagnosis of SARS-CoV-2 by FDA under an Emergency Use Authorization (EUA). This EUA will remain in effect (meaning this test can be used) for the duration of the COVID-19 declaration under Section 564(b)(1) of the Act, 21 U.S.C. section 360bbb-3(b)(1), unless the authorization is  terminated or revoked.  Performed at [facility], [place] [address] [place], [place] [address]      Radiology Studies: No results found.   Scheduled Meds:  apixaban  2.5 mg Oral BID   atorvastatin  10 mg Oral Q M,W,F,Su-1800   atorvastatin  20 mg Oral Q T,Th,Sat-1800   diltiazem  120 mg Oral Daily   furosemide  60 mg Intravenous Q12H   gabapentin  100 mg Oral TID   insulin aspart  0-5 Units Subcutaneous QHS   insulin aspart  0-9 Units Subcutaneous TID WC   insulin glargine-yfgn  15 Units Subcutaneous Daily   iron polysaccharides  150 mg Oral Daily   loratadine  10 mg Oral Daily   metoprolol tartrate  25 mg Oral BID   pantoprazole  40 mg Oral Daily   sertraline  100 mg Oral Daily   sodium chloride flush  3 mL Intravenous Q12H   umeclidinium bromide  1 puff Inhalation Daily   Continuous Infusions:  sodium chloride       LOS: 2 days    Time spent: 14min    Domenic Polite, MD [facility]   02/14/2022, 3:07 PM

## 2022-02-14 NOTE — Anesthesia Postprocedure Evaluation (Signed)
Anesthesia Post Note  Patient: Katelyn Mahoney  Procedure(s) Performed: CARDIOVERSION     Patient location during evaluation: Endoscopy Anesthesia Type: General Level of consciousness: awake and alert Pain management: pain level controlled Vital Signs Assessment: post-procedure vital signs reviewed and stable Respiratory status: spontaneous breathing, nonlabored ventilation and respiratory function stable Cardiovascular status: blood pressure returned to baseline and stable Postop Assessment: no apparent nausea or vomiting Anesthetic complications: no   No notable events documented.  Last Vitals:  Vitals:   02/14/22 1956 02/14/22 2000  BP: (!) 109/50 (!) 111/52  Pulse: 79 79  Resp: 20 (!) 25  Temp: 36.8 C   SpO2: 94%     Last Pain:  Vitals:   02/14/22 2000  TempSrc:   PainSc: 0-No pain                 Sylvester Minton

## 2022-02-14 NOTE — Evaluation (Signed)
Occupational Therapy Evaluation Patient Details Name: Katelyn Mahoney MRN: [id number] DOB: 1934-09-29 Today's Date: 02/14/2022   History of Present Illness Pt is an 88 y.o. female who presented 02/14/22 with SOB,  weight gain, and uncontrolled A-fib. Pt admitted for acute on chronic HFpEF exacerbation. Patient with cardioversion on 2/13. Recent admit on 01/08/22 for CHF exacerbation. PMH includes DM II, HTN, obesity, CKD, HLD, chronic LE edema, chronic hypoxic respiratory failure on 4L oxygen at home, Afib, HfpEF   Clinical Impression   Prior to this admission, patient was living at home with daughter and son in law. Patient with recent admit on 01/08/22 for same symptoms and was discharged with home health services. Per patient report, patient has seen little progress since last admission, as patient endorses continual shortness of breath with all activity (patient is on 4L of oxygen at baseline). Patient's current limitation remains shortness of breath, decreased activity tolerance, and mod A for ADLs. Patient would continue to benefit from home health services in order to increase overall activity tolerance and independence. OT recommending adding HHOT services when patient is ready for discharge. OT will continue to follow acutely to address problem list outlined below.      Recommendations for follow up therapy are one component of a multi-disciplinary discharge planning process, led by the attending physician.  Recommendations may be updated based on patient status, additional functional criteria and insurance authorization.   Follow Up Recommendations  Home health OT    Assistance Recommended at Discharge Intermittent Supervision/Assistance  Patient can return home with the following Help with stairs or ramp for entrance;A little help with bathing/dressing/bathroom;A little help with walking and/or transfers;Assistance with cooking/housework;Direct supervision/assist for financial  management;Direct supervision/assist for medications management    Functional Status Assessment  Patient has had a recent decline in their functional status and demonstrates the ability to make significant improvements in function in a reasonable and predictable amount of time.  Equipment Recommendations  None recommended by OT    Recommendations for Other Services       Precautions / Restrictions Precautions Precautions: Fall;Other (comment) Precaution Comments: 4L O2 at baseline Restrictions Weight Bearing Restrictions: No (Simultaneous filing. User may not have seen previous data.)      Mobility Bed Mobility               General bed mobility comments: Up in recliner upon arrival    Transfers Overall transfer level: Needs assistance Equipment used: Rolling walker (2 wheels) Transfers: Sit to/from Stand Sit to Stand: Min guard           General transfer comment:  (increased time and effort, requiring momentum to come to standing, lack of controlled descent to return to sitting)      Balance Overall balance assessment: Needs assistance Sitting-balance support: No upper extremity supported, Feet supported Sitting balance-Leahy Scale: Fair     Standing balance support: Reliant on assistive device for balance, Bilateral upper extremity supported Standing balance-Leahy Scale: Poor Standing balance comment: Reliant on RW                           ADL either performed or assessed with clinical judgement   ADL Overall ADL's : Needs assistance/impaired Eating/Feeding: Independent   Grooming: Set up;Sitting   Upper Body Bathing: Set up;Sitting   Lower Body Bathing: Moderate assistance;Sit to/from stand Lower Body Bathing Details (indicate cue type and reason): bird bathes at home currently due to fatigue Upper Body Dressing :  Set up;Sitting   Lower Body Dressing: Maximal assistance;Sitting/lateral leans Lower Body Dressing Details (indicate cue  type and reason): assist for socks but uses sock aide at home, has not put on socks in years per report to date Toilet Transfer: Min guard;Ambulation;Rolling walker (2 wheels);BSC/3in1   Toileting- Clothing Manipulation and Hygiene: Moderate assistance;Sit to/from stand;Minimal assistance       Functional mobility during ADLs: Moderate assistance;Rolling walker (2 wheels) General ADL Comments: Patient presenting with decreased activity tolerance and DOE     Vision Baseline Vision/History: 1 Wears glasses Ability to See in Adequate Light: 0 Adequate Patient Visual Report: No change from baseline       Perception     Praxis      Pertinent Vitals/Pain Pain Assessment Pain Assessment: No/denies pain     Hand Dominance Right (Simultaneous filing. User may not have seen previous data.)   Extremity/Trunk Assessment Upper Extremity Assessment Upper Extremity Assessment: Overall WFL for tasks assessed;Generalized weakness   Lower Extremity Assessment Lower Extremity Assessment: Defer to PT evaluation;Generalized weakness   Cervical / Trunk Assessment Cervical / Trunk Assessment: Other exceptions Cervical / Trunk Exceptions: increased body habitus   Communication Communication Communication: No difficulties    Cognition Arousal/Alertness: Awake/alert Behavior During Therapy: WFL for tasks assessed/performed Overall Cognitive Status: Within Functional Limits for tasks assessed                                       General Comments  Pt on 3L on arrival with sats 91%.  Pt desat to 85% on RA therefore replaced O2 at 3L.  Needed 6LO2 with activity to keep sats >88% and then returned O2 to 3L at end of treatment with sats 90%.  Other VSS    Exercises     Shoulder Instructions      Home Living Family/patient expects to be discharged to:: Private residence  Living Arrangements: Children  Available Help at Discharge: Family;Available PRN/intermittently ( Type  of Home: House  Home Access: Stairs to enter  [employer] of Steps: 3  Entrance Stairs-Rails: None  Home Layout: One level      Bathroom Shower/Tub: Walk-in shower;Sponge bathes at baseline    [employer]: Handicapped height  Bathroom Accessibility: No   Home Equipment: Cane - single point;Rollator (4 wheels);BSC/3in1;Cane - quad   Additional Comments: Uses 4L supplemental O2 at baseline. daughter lives with her and works during the day. (      Prior Functioning/Environment Prior Level of Function : Independent/Modified Independent             Mobility Comments: Uses rollator primarily. Reports only being able to ambulate short distances in the home before fatiguing recnetly, even needing to sit to rest 1x before reaching proximal bathroom in home. Reports she was previously able to go further before SOB worsened. Pt drives at baseline, meets up with friends once a week to eat out. (Simultaneous filing. User may not have seen previous data.) ADLs Comments: Assist with IADLs (Simultaneous filing. User may not have seen previous data.)        OT Problem List: Decreased strength;Decreased activity tolerance;Impaired balance (sitting and/or standing);Decreased knowledge of use of DME or AE;Cardiopulmonary status limiting activity;Obesity;Decreased knowledge of precautions;Pain;Impaired UE functional use;Increased edema      OT Treatment/Interventions: Self-care/ADL training;Therapeutic exercise;Energy conservation;DME and/or AE instruction;Therapeutic activities;Patient/family education;Balance training    OT Goals(Current goals can be found in  the care plan section) Acute Rehab OT Goals Patient Stated Goal: to feel like i can catch my breath OT Goal Formulation: With patient Time For Goal Achievement: 02/28/22 Potential to Achieve Goals: Fair  OT Frequency: Min 2X/week    Co-evaluation              AM-PAC OT "6 Clicks" Daily Activity     Outcome Measure  Help from another person eating meals?: None Help from another person taking care of personal grooming?: A Little Help from another person toileting, which includes using toliet, bedpan, or urinal?: A Lot Help from another person bathing (including washing, rinsing, drying)?: A Lot Help from another person to put on and taking off regular upper body clothing?: A Little Help from another person to put on and taking off regular lower body clothing?: A Lot 6 Click Score: 16   End of Session Equipment Utilized During Treatment: Oxygen;Rolling walker (2 wheels) Nurse Communication: Mobility status;Other (comment)  Activity Tolerance: Patient tolerated treatment well;Patient limited by fatigue Patient left: in chair;with call bell/phone within reach  OT Visit Diagnosis: Unsteadiness on feet (R26.81);Other abnormalities of gait and mobility (R26.89);Muscle weakness (generalized) (M62.81)                Time: 7425-9563 OT Time Calculation (min): 20 min Charges:  OT General Charges $OT Visit: 1 Visit OT Evaluation $OT Eval Moderate Complexity: 1 Mod  Corinne Ports E. Ineze Serrao, OTR/L Acute Rehabilitation Services [phone] [place] 02/14/2022, 12:21 PM

## 2022-02-14 NOTE — Progress Notes (Signed)
Subjective:  Feels okay, still has shortness of breath Feels her nose is congested  S/p cardioversion 2/14  I am not sure urine output is accurately measured. Patient says there was leakge of urine last night 300 CC in Purewick suction this morning  Objective:  Vital Signs in the last 24 hours: Temp:  [97.5 F (36.4 C)-98.3 F (36.8 C)] 97.6 F (36.4 C) (02/15 0713) Pulse Rate:  [53-93] 84 (02/15 0713) Resp:  [13-25] 19 (02/15 0713) BP: (104-149)/(44-91) 104/58 (02/15 0713) SpO2:  [92 %-100 %] 92 % (02/15 0757) Weight:  [133.8 kg-133.9 kg] 133.9 kg (02/15 0331)  Intake/Output from previous day: 02/14 0701 - 02/15 0700 In: 730 [P.O.:730] Out: 600 [Urine:600]  Physical Exam Vitals and nursing note reviewed.  Constitutional:      General: She is not in acute distress. Neck:     Vascular: No JVD.  Cardiovascular:     Rate and Rhythm: Normal rate and regular rhythm.     Heart sounds: Normal heart sounds. No murmur heard. Pulmonary:     Effort: Pulmonary effort is normal.     Breath sounds: Normal breath sounds. No wheezing or rales.  Musculoskeletal:     Right lower leg: Edema (Ace wraps in place) present.     Left lower leg: Edema (Ace wraps in place) present.     Lab Results: Reviewed and interpreted: BMP  Imaging/tests reviewed and independently interpreted: EKG 02/13/2022: Sinus rhythm w/PACs  Cardiac Studies (Historical data):  EKG 02/13/2022: Sinus rhythm w/PACs  DCCV 02/13/2022  EKG 02/08/2022: Atrial fibrillation 114 bpm Frequent PVCs  Nonspecific T-abnormality  Low voltage   Echocardiogram 01/22/2022:  1. ? left pleural effusion.   2. Left ventricular ejection fraction, by estimation, is 50 to 55%. The  left ventricle has low normal function. The left ventricle has no regional  wall motion abnormalities. Left ventricular diastolic parameters are  indeterminate.   3. Right ventricular systolic function is mildly reduced. The right  ventricular size  is mildly enlarged.   4. Left atrial size was moderately dilated.   5. Right atrial size was moderately dilated.   6. The mitral valve is abnormal. Moderate mitral valve regurgitation. No  evidence of mitral stenosis.   7. Tricuspid valve regurgitation is moderate.   8. The aortic valve is tricuspid. There is moderate calcification of the  aortic valve. There is moderate thickening of the aortic valve. Aortic  valve regurgitation is trivial. Mild aortic valve stenosis.   9. The inferior vena cava is dilated in size with >50% respiratory  variability, suggesting right atrial pressure of 8 mmHg.    Echocardiogram 12/13/2021:  1. Left ventricular ejection fraction, by estimation, is 50 to 55%. The  left ventricle has low normal function. The left ventricle has no regional  wall motion abnormalities. Left ventricular diastolic parameters are  consistent with Grade II diastolic  dysfunction (pseudonormalization).   2. Right ventricular systolic function is normal. The right ventricular  size is normal. There is moderately elevated pulmonary artery systolic  pressure. The estimated right ventricular systolic pressure is 47.6 mmHg.   3. A small pericardial effusion is present. The pericardial effusion is  circumferential.   4. The mitral valve is degenerative. Moderate mitral valve regurgitation.  No evidence of mitral stenosis.   5. The aortic valve is tricuspid. There is mild calcification of the  aortic valve. There is mild thickening of the aortic valve. Aortic valve  regurgitation is trivial. Aortic valve sclerosis/calcification is present,  without any evidence of aortic  stenosis.   6. The inferior vena cava is dilated in size with <50% respiratory  variability, suggesting right atrial pressure of 15 mmHg.    CT Chest 01/25/2022: Small bilateral pleural effusions are noted with adjacent subsegmental atelectasis.   7 mm subpleural nodule seen in right upper lobe. Non-contrast  chest CT at 6-12 months is recommended. If the nodule is stable at time of repeat CT, then future CT at 18-24 months (from today's scan) is considered optional for low-risk patients, but is recommended for high-risk patients. This recommendation follows the consensus statement: Guidelines for Management of Incidental Pulmonary Nodules Detected on CT Images: From the Fleischner Society 2017; Radiology 2017; 284:228-243.   Mild mosaic pattern is noted throughout both lungs suggesting air trapping secondary to small airways disease or potentially mild multifocal inflammation.   Mild coronary calcifications are noted.   Aortic Atherosclerosis (ICD10-I70.0).   Assessment & Recommendations:   88 year old Caucasian female with hypertension, type 2 diabetes mellitus, obesity, persistent A-fib, progressive CKD, admitted with acute hypoxic respiratory failure, acute on chronic HFpEF   Acute on chronic hypoxic respiratory failure: Likely multifactorial, including progression of chronic respiratory failure due to HFpEF, obesity, deconditioning, AKI/CKD A-fib certainly contributing, see below. Cr has improved to 2.45 from 2.88 since cardioversion. IO am optimistic that maintaining sinus rhythm will help her heart failure and renal function While in the room, I checked patients sats without supplemental oxygen. They were down to 88%, back up to 94% on 3 L O2, which is lower than before. Currently on IV lasix 60 mg bid. Continue the same.  Needs strict I/O Continue monitoring of renal function. I expect improvement in Cr to an extent. I do think underlying CKD will limit eventual improvement.    Persistent A-fib: Now in sinus rhythm s/p cardioversion 02/13/2022 Continue current rate control therapy [id number] score high.  Continue Eliquis 2.5 mg twice daily.    Rest of the medical management as per the primary team.   Discussed interpretation of tests and management recommendations with th  primary team       Nigel Mormon, MD Pager: [phone] Office: [phone]

## 2022-02-14 NOTE — Plan of Care (Signed)
°  Problem: Activity: Goal: Capacity to carry out activities will improve Outcome: Progressing   Problem: Cardiac: Goal: Ability to achieve and maintain adequate cardiopulmonary perfusion will improve Outcome: Progressing   Problem: Clinical Measurements: Goal: Diagnostic test results will improve Outcome: Progressing Goal: Respiratory complications will improve Outcome: Progressing   Problem: Coping: Goal: Level of anxiety will decrease Outcome: Progressing   Problem: Elimination: Goal: Will not experience complications related to urinary retention Outcome: Progressing   Problem: Safety: Goal: Ability to remain free from injury will improve Outcome: Progressing

## 2022-02-14 NOTE — Progress Notes (Signed)
Mobility Specialist Progress Note:   02/14/22 1400  Mobility  Activity Ambulated with assistance in room;Transferred to/from [facility]  Level of Assistance Minimal assist, patient does 75% or more  Assistive Device Front wheel walker  Distance Ambulated (ft) 20 ft  Activity Response Tolerated fair  $Mobility charge 1 Mobility   Pt eager for ambulation. Required seated rest break after 60ft d/t SOB on 4LO2. Pt requested to go to [facility]. Pt left on BSC with call bell, will f/u to get pt back to chair.   Nelta Numbers Acute Rehabilitation Services Phone: [phone] Office Phone: [phone]

## 2022-02-14 NOTE — TOC Initial Note (Addendum)
Transition of Care [facility]) - Initial/Assessment Note    Patient Details  Name: Katelyn Mahoney MRN: [id number] Date of Birth: 1934-12-12  Transition of [facility]) CM/SW Contact:    Zenon Mayo, RN Phone Number: 02/14/2022, 11:54 AM  Clinical Narrative:                 From home with daughter, she is active with [facility] for HHRN,HHPT, HHAIDE and Social Worker, will add HHOT to [employer]. Cory with [place] confirmed.  Will need orders.  She also has home oxygen 4 liters, she think it is with [employer].  TOC will continue to follow for dc needs.  2/16-patient told Staff RN her oxygen is with [employer].   Expected Discharge Plan: [place] Barriers to Discharge: No Barriers Identified   Patient Goals and CMS Choice Patient states their goals for this hospitalization and ongoing recovery are:: return home CMS Medicare.gov Compare Post Acute Care list provided to:: Patient Choice offered to / list presented to : Patient  Expected Discharge Plan and Services Expected Discharge Plan: [place]   Discharge Planning Services: CM Consult Post Acute Care Choice: Resumption of Svcs/PTA Provider, Home Health Living arrangements for the past 2 months: Single Family Home                   DME Agency: NA       HH Arranged: RN, PT, Nurse's Aide, Social Work, OT [place] Agency: [place] Date New Post: 02/14/22 Time [place]: 4 Representative spoke with at [place] Arrangements/Services Living arrangements for the past 2 months: [place] Lives with:: Adult Children Patient language and need for interpreter reviewed:: Yes Do you feel safe going back to the place where you live?: Yes      Need for Family Participation in Patient Care: Yes (Comment) Care giver support system in place?: Yes (comment) Current home services: DME (home [employer] (4) liters) Criminal Activity/Legal Involvement  Pertinent to Current Situation/Hospitalization: No - Comment as needed  Activities of Daily Living Home Assistive Devices/Equipment: Hand-held shower hose, Eyeglasses, Cane (specify quad or straight) ADL Screening (condition at time of admission) Patient's cognitive ability adequate to safely complete daily activities?: Yes Is the patient deaf or have difficulty hearing?: No Does the patient have difficulty seeing, even when wearing glasses/contacts?: No Does the patient have difficulty concentrating, remembering, or making decisions?: No Patient able to express need for assistance with ADLs?: Yes Does the patient have difficulty dressing or bathing?: Yes Independently performs ADLs?: Yes (appropriate for developmental age) Does the patient have difficulty walking or climbing stairs?: Yes Weakness of Legs: Both Weakness of Arms/Hands: None  Permission Sought/Granted                  Emotional Assessment Appearance:: Appears stated age Attitude/Demeanor/Rapport: Engaged Affect (typically observed): Appropriate Orientation: : Oriented to Self, Oriented to Place, Oriented to  Time, Oriented to Situation Alcohol / Substance Use: Not Applicable Psych Involvement: No (comment)  Admission diagnosis:  CHF (congestive heart failure) (HCC) [I50.9] Acute on chronic diastolic congestive heart failure (HCC) [I50.33] Longstanding persistent atrial fibrillation ([place]) [I48.11] Patient Active Problem List   Diagnosis Date Noted   Chronic atrial fibrillation with RVR ([place]) 02/13/2022   CKD (chronic kidney disease), stage IV ([place]) 02/13/2022   CHF (congestive heart failure) ([place]) 02/12/2022   Acute on chronic respiratory failure with hypoxia ([place]) 02/08/2022  Gastrointestinal hemorrhage    Persistent atrial fibrillation (HCC)    Acute exacerbation of CHF (congestive heart failure) ([place]) 01/09/2022   Atrial fibrillation with RVR (HCC)    Acute congestive heart failure ([place])    Urinary  tract infection 12/13/2021   AKI (acute kidney injury) ([place]) 12/13/2021   At risk for sepsis 12/13/2021   Primary osteoarthritis of both knees 03/09/2021   Urinary frequency 03/09/2021   Depression, major, single episode, in partial remission ([place]) 03/09/2021   Edema of lower extremity 08/20/2017   Hyperlipidemia 08/20/2017   CKD stage G3b/A1, GFR 30-44 and albumin creatinine ratio <30 mg/g (HCC) 02/27/2017   Allergic urticaria 08/02/2016   Fungal dermatitis 11/08/2015   Right leg pain 10/14/2015   Physical exam 11/09/2014   Dermatophytosis of nail 09/24/2013   Arthritis    Claw toe (acquired) 04/17/2013   Hammer toe, acquired 04/17/2013   Spherocytosis, hereditary ([place]) 08/30/2011   Diabetes mellitus ([place]) 10/17/2010   Obesity, Class III, BMI 40-49.9 (morbid obesity) ([place]) 03/19/2008   Primary hypertension 03/19/2008   ROTATOR CUFF SPRAIN AND STRAIN 03/19/2008   Hyperlipidemia associated with type 2 diabetes mellitus ([place]) 09/05/2007   Depression 09/05/2007   ROSACEA 05/19/2007   PCP:  Ann Held, DO Pharmacy:   [facility] [place], [place] - [address] [place] [address] Phone: [phone] Fax: [phone]  [facility] - [place], [place] [address] [place] [address] Phone: [phone] Fax: [phone]     Social Determinants of Health (SDOH) Interventions    Readmission Risk Interventions Readmission Risk Prevention Plan 02/14/2022 01/26/2022  Transportation Screening Complete Complete  PCP or Specialist Appt within 3-5 Days Complete Complete  HRI or [place] Complete Complete  Social Work Consult for [place] Planning/Counseling Complete Complete  Palliative Care Screening Not Applicable Not Applicable  Medication Review Press photographer) Complete Complete  Some recent data might be hidden

## 2022-02-14 NOTE — Progress Notes (Signed)
Mobility Specialist Progress Note:   02/14/22 1430  Mobility  Activity Transferred to/from [facility]  Level of Assistance Minimal assist, patient does 75% or more  Assistive Device Front wheel walker  Distance Ambulated (ft) 2 ft  Activity Response Tolerated well  $Mobility charge 1 Mobility   Pt requesting to get back in bed upon arrival. Required MinA to help BLE onto bed. Pt left in bed with all needs met, bed alarm on.   Nelta Numbers Acute Rehabilitation Services Phone: [phone] Office Phone: [phone]

## 2022-02-14 NOTE — Evaluation (Signed)
Physical Therapy Evaluation Patient Details Name: Katelyn Mahoney MRN: [id number] DOB: 12/09/1934 Today's Date: 02/14/2022  History of Present Illness  Pt is an 88 y.o. female who presented 02/14/22 with SOB,  weight gain, and uncontrolled A-fib. Pt admitted for acute on chronic HFpEF exacerbation. Patient with cardioversion on 2/13. Recent admit on 01/08/22 for CHF exacerbation. PMH includes DM II, HTN, obesity, CKD, HLD, chronic LE edema, chronic hypoxic respiratory failure on 4L oxygen at home, Afib, HfpEF  Clinical Impression  Pt admitted with above diagnosis. Pt was able to ambulate with RW in room. Walks short distances at home. Desaturates with little activity. Poor endurance and pt wants to go to SNF for therapy prior to d/c home. Feel she would benefit from short term SNF.  Will follow acutely.  Pt currently with functional limitations due to the deficits listed below (see PT Problem List). Pt will benefit from skilled PT to increase their independence and safety with mobility to allow discharge to the venue listed below.          Recommendations for follow up therapy are one component of a multi-disciplinary discharge planning process, led by the attending physician.  Recommendations may be updated based on patient status, additional functional criteria and insurance authorization.  Follow Up Recommendations Skilled nursing-short term rehab (<3 hours/day)    Assistance Recommended at Discharge Intermittent Supervision/Assistance  Patient can return home with the following  A little help with walking and/or transfers;A little help with bathing/dressing/bathroom;Assistance with cooking/housework;Assist for transportation;Help with stairs or ramp for entrance    Equipment Recommendations None recommended by PT  Recommendations for Other Services       Functional Status Assessment Patient has had a recent decline in their functional status and demonstrates the ability to make significant  improvements in function in a reasonable and predictable amount of time.     Precautions / Restrictions Precautions Precautions: Fall;Other (comment) Precaution Comments: 4L O2 at baseline Restrictions Weight Bearing Restrictions: No      Mobility  Bed Mobility               General bed mobility comments: on 3N1 on arrival    Transfers Overall transfer level: Needs assistance Equipment used: Rolling walker (2 wheels) Transfers: Sit to/from Stand Sit to Stand: Min guard           General transfer comment:  (increased time and effort, requiring momentum to come to standing, lack of controlled descent to return to sitting)    Ambulation/Gait Ambulation/Gait assistance: Min guard Gait Distance (Feet): 25 Feet Assistive device: Rolling walker (2 wheels) Gait Pattern/deviations: Step-through pattern, Wide base of support, Decreased stride length Gait velocity: decreased Gait velocity interpretation: <1.31 ft/sec, indicative of household ambulator   General Gait Details: minG for line management, pt fatiguing after mobility in room, declined mobility into hall today but agreeable with recliner follow in future  Stairs            Wheelchair Mobility    Modified Rankin (Stroke Patients Only)       Balance Overall balance assessment: Needs assistance Sitting-balance support: No upper extremity supported, Feet supported Sitting balance-Leahy Scale: Fair Sitting balance - Comments: supervision   Standing balance support: Reliant on assistive device for balance, Bilateral upper extremity supported, During functional activity Standing balance-Leahy Scale: Poor Standing balance comment: Reliant on RW  Pertinent Vitals/Pain Pain Assessment Pain Assessment: Faces Faces Pain Scale: Hurts little more Pain Location: generalized with mobility Pain Descriptors / Indicators: Discomfort, Grimacing, Guarding Pain  Intervention(s): Limited activity within patient's tolerance, Monitored during session, Repositioned    Home Living Family/patient expects to be discharged to:: Private residence Living Arrangements: Children Available Help at Discharge: Family;Available PRN/intermittently Type of Home: House Home Access: Stairs to enter Entrance Stairs-Rails: None Entrance Stairs-Number of Steps: 3   Home Layout: One level Home Equipment: Cane - single point;Rollator (4 wheels);BSC/3in1;Cane - quad Additional Comments: Uses 4L supplemental O2 at baseline. daughter lives with her and works during the day.    Prior Function Prior Level of Function : Independent/Modified Independent             Mobility Comments: Uses rollator primarily. Reports only being able to ambulate short distances in the home before fatiguing recnetly, even needing to sit to rest 1x before reaching proximal bathroom in home. Reports she was previously able to go further before SOB worsened. Pt drives at baseline, meets up with friends once a week to eat out. ADLs Comments: Assist with IADLs     Hand Dominance   Dominant Hand: Right    Extremity/Trunk Assessment   Upper Extremity Assessment Upper Extremity Assessment: Defer to OT evaluation    Lower Extremity Assessment Lower Extremity Assessment: Generalized weakness    Cervical / Trunk Assessment Cervical / Trunk Assessment: Other exceptions Cervical / Trunk Exceptions: increased body habitus  Communication   Communication: No difficulties  Cognition Arousal/Alertness: Awake/alert Behavior During Therapy: WFL for tasks assessed/performed Overall Cognitive Status: Within Functional Limits for tasks assessed                                          General Comments General comments (skin integrity, edema, etc.): Pt on 3L on arrival with sats 91%.  Pt desat to 85% on RA therefore replaced O2 at 3L.  Needed 6LO2 with activity to keep sats >88%  and then returned O2 to 3L at end of treatment with sats 90%.  Other VSS    Exercises General Exercises - Lower Extremity Ankle Circles/Pumps: AROM, Both, Supine, 20 reps, Seated Long Arc Quad: AROM, Both, 10 reps, Seated, Strengthening Hip Flexion/Marching: AROM, Both, 10 reps, Seated Other Exercises Other Exercises: sit <> stand using UEs, 5x   Assessment/Plan    PT Assessment Patient needs continued PT services  PT Problem List Decreased mobility;Decreased balance;Decreased activity tolerance;Cardiopulmonary status limiting activity;Decreased strength;Decreased range of motion;Obesity       PT Treatment Interventions DME instruction;Gait training;Functional mobility training;Therapeutic activities;Therapeutic exercise;Balance training;Patient/family education;Stair training;Neuromuscular re-education    PT Goals (Current goals can be found in the Care Plan section)  Acute Rehab PT Goals Patient Stated Goal: to go home PT Goal Formulation: With patient Time For Goal Achievement: 02/28/22 Potential to Achieve Goals: Good    Frequency Min 2X/week     Co-evaluation               AM-PAC PT "6 Clicks" Mobility  Outcome Measure Help needed turning from your back to your side while in a flat bed without using bedrails?: A Little Help needed moving from lying on your back to sitting on the side of a flat bed without using bedrails?: A Lot Help needed moving to and from a bed to a chair (including a wheelchair)?: A Little Help  needed standing up from a chair using your arms (e.g., wheelchair or bedside chair)?: A Little Help needed to walk in hospital room?: A Little Help needed climbing 3-5 steps with a railing? : Total 6 Click Score: 15    End of Session Equipment Utilized During Treatment: Gait belt;Oxygen Activity Tolerance: Patient limited by fatigue Patient left: in chair;with call bell/phone within reach Nurse Communication: Mobility status PT Visit Diagnosis:  Muscle weakness (generalized) (M62.81);Unsteadiness on feet (R26.81);Other abnormalities of gait and mobility (R26.89);Difficulty in walking, not elsewhere classified (R26.2)    Time: 1001-1017 PT Time Calculation (min) (ACUTE ONLY): 16 min   Charges:   PT Evaluation $PT Eval Moderate Complexity: 1 Mod          Ikeya Brockel M,PT Acute Rehab Services [phone] (pager)   [name] 02/14/2022, 2:36 PM

## 2022-02-14 NOTE — Progress Notes (Signed)
Inpatient Diabetes Program Recommendations  AACE/ADA: New Consensus Statement on Inpatient Glycemic Control (2015)  Target Ranges:  Prepandial:   less than 140 mg/dL      Peak postprandial:   less than 180 mg/dL (1-2 hours)      Critically ill patients:  140 - 180 mg/dL   Lab Results  Component Value Date   GLUCAP 189 (H) 02/14/2022   HGBA1C 6.2 02/02/2022    Review of Glycemic Control  Latest Reference Range & Units 02/13/22 06:05 02/13/22 12:23 02/13/22 16:25 02/13/22 21:24 02/14/22 06:06  Glucose-Capillary 70 - 99 mg/dL 198 (H) 176 (H) 203 (H) 267 (H) 189 (H)   Diabetes history: DM 2 Outpatient Diabetes medications: Humalog 6 units tid with meals, Ozempic 1 mg weekly, Toujeo 34 units daily Current orders for Inpatient glycemic control:  Novolog sensitive tid with meals and HS Inpatient Diabetes Program Recommendations:    Consider adding Semglee 15 units daily while patient is in the hospital (this is less than 1/2 of home dose of basal insulin).   Thanks,  Adah Perl, RN, BC-ADM Inpatient Diabetes Coordinator Pager [phone]  (8a-5p)

## 2022-02-15 DIAGNOSIS — I5033 Acute on chronic diastolic (congestive) heart failure: Secondary | ICD-10-CM | POA: Diagnosis not present

## 2022-02-15 DIAGNOSIS — I4819 Other persistent atrial fibrillation: Secondary | ICD-10-CM | POA: Diagnosis not present

## 2022-02-15 LAB — CBC
HCT: 26.7 % — ABNORMAL LOW (ref 36.0–46.0)
Hemoglobin: 7.8 g/dL — ABNORMAL LOW (ref 12.0–15.0)
MCH: 29.1 pg (ref 26.0–34.0)
MCHC: 29.2 g/dL — ABNORMAL LOW (ref 30.0–36.0)
MCV: 99.6 fL (ref 80.0–100.0)
Platelets: 462 10*3/uL — ABNORMAL HIGH (ref 150–400)
RBC: 2.68 MIL/uL — ABNORMAL LOW (ref 3.87–5.11)
RDW: 16.4 % — ABNORMAL HIGH (ref 11.5–15.5)
WBC: 10 10*3/uL (ref 4.0–10.5)
nRBC: 1.1 % — ABNORMAL HIGH (ref 0.0–0.2)

## 2022-02-15 LAB — GLUCOSE, CAPILLARY
Glucose-Capillary: 158 mg/dL — ABNORMAL HIGH (ref 70–99)
Glucose-Capillary: 284 mg/dL — ABNORMAL HIGH (ref 70–99)
Glucose-Capillary: 193 mg/dL — ABNORMAL HIGH (ref 70–99)
Glucose-Capillary: 232 mg/dL — ABNORMAL HIGH (ref 70–99)

## 2022-02-15 LAB — BASIC METABOLIC PANEL
Anion gap: 10 (ref 5–15)
BUN: 62 mg/dL — ABNORMAL HIGH (ref 8–23)
CO2: 31 mmol/L (ref 22–32)
Calcium: 8.6 mg/dL — ABNORMAL LOW (ref 8.9–10.3)
Chloride: 96 mmol/L — ABNORMAL LOW (ref 98–111)
Creatinine, Ser: 2.39 mg/dL — ABNORMAL HIGH (ref 0.44–1.00)
GFR, Estimated: 19 mL/min — ABNORMAL LOW (ref >60)
Glucose, Bld: 177 mg/dL — ABNORMAL HIGH (ref 70–99)
Potassium: 3.6 mmol/L (ref 3.5–5.1)
Sodium: 137 mmol/L (ref 135–145)

## 2022-02-15 MED ORDER — LEVALBUTEROL HCL 0.63 MG/3ML IN NEBU
0.6300 mg | INHALATION_SOLUTION | Freq: Four times a day (QID) | RESPIRATORY_TRACT | Status: DC
  Administered 2022-02-15 (×2): 0.63 mg via RESPIRATORY_TRACT
  Filled 2022-02-15 (×2): qty 3

## 2022-02-15 MED ORDER — LEVALBUTEROL HCL 0.63 MG/3ML IN NEBU
0.6300 mg | INHALATION_SOLUTION | Freq: Four times a day (QID) | RESPIRATORY_TRACT | Status: DC
  Administered 2022-02-15: 0.63 mg via RESPIRATORY_TRACT
  Filled 2022-02-15: qty 3

## 2022-02-15 MED ORDER — FUROSEMIDE 10 MG/ML IJ SOLN
80.0000 mg | Freq: Two times a day (BID) | INTRAMUSCULAR | Status: DC
  Administered 2022-02-15 – 2022-02-17 (×4): 80 mg via INTRAVENOUS
  Filled 2022-02-15 (×4): qty 8

## 2022-02-15 NOTE — Progress Notes (Signed)
Mobility Specialist Progress Note:   02/15/22 1020  Mobility  Activity Ambulated with assistance in room  Level of Assistance Moderate assist, patient does 50-74%  Assistive Device Front wheel walker  Distance Ambulated (ft) 30 ft  Activity Response Tolerated well  $Mobility charge 1 Mobility    Pre Mobility: SpO2 100% 3LO2 During Mobility: SpO2 85% 4LO2 Post Mobility: SpO2 95% 4LO2  Pt eager for OOB mobility this am. Required modA to stand from EOB, MinG during ambulation. Pt did not require a seated rest break this session. Pt desat to 85% on 4LO2 upon exertion, quickly recovered with pursed lip breathing. Pt left sitting up in chair with all needs met.   Nelta Numbers Acute Rehabilitation Services Phone: [phone] Office Phone: [phone]

## 2022-02-15 NOTE — NC FL2 (Signed)
[place] LEVEL OF CARE SCREENING TOOL     IDENTIFICATION  Patient Name: Katelyn Mahoney Birthdate: 1934/04/05 Sex: female Admission Date (Current Location): 02/12/2022  [facility] and [place] Number:  Herbalist and Address:  [facility] [place], [place] [address] [place], [place] [address]      Provider Number: [id number]  Attending Physician Name and Address:  Domenic Polite, MD  Relative Name and Phone Number:  Sheria Lang Daughter [phone]    Current Level of Care: Hospital Recommended Level of Care: [place] Prior Approval Number:    Date Approved/Denied:   PASRR Number: [id number]  Discharge Plan: SNF    Current Diagnoses: Patient Active Problem List   Diagnosis Date Noted   Pulmonary nodule 02/14/2022   Chronic atrial fibrillation with RVR ([place]) 02/13/2022   CKD (chronic kidney disease), stage IV ([place]) 02/13/2022   CHF (congestive heart failure) ([place]) 02/12/2022   Acute on chronic respiratory failure with hypoxia ([place]) 02/08/2022   Gastrointestinal hemorrhage    Persistent atrial fibrillation ([place])    Acute exacerbation of CHF (congestive heart failure) ([place]) 01/09/2022   Atrial fibrillation with RVR ([place])    Acute congestive heart failure ([place])    Urinary tract infection 12/13/2021   AKI (acute kidney injury) ([place]) 12/13/2021   At risk for sepsis 12/13/2021   Primary osteoarthritis of both knees 03/09/2021   Urinary frequency 03/09/2021   Depression, major, single episode, in partial remission ([place]) 03/09/2021   Edema of lower extremity 08/20/2017   Hyperlipidemia 08/20/2017   CKD stage G3b/A1, GFR 30-44 and albumin creatinine ratio <30 mg/g (HCC) 02/27/2017   Allergic urticaria 08/02/2016   Fungal dermatitis 11/08/2015   Right leg pain 10/14/2015   Physical exam 11/09/2014   Dermatophytosis of nail 09/24/2013   Arthritis    Claw toe (acquired) 04/17/2013   Hammer toe, acquired 04/17/2013    Spherocytosis, hereditary ([place]) 08/30/2011   Diabetes mellitus ([place]) 10/17/2010   Obesity, Class III, BMI 40-49.9 (morbid obesity) ([place]) 03/19/2008   Primary hypertension 03/19/2008   ROTATOR CUFF SPRAIN AND STRAIN 03/19/2008   Hyperlipidemia associated with type 2 diabetes mellitus ([place]) 09/05/2007   Depression 09/05/2007   ROSACEA 05/19/2007    Orientation RESPIRATION BLADDER Height & Weight     Self, Time, Situation, Place  O2 Incontinent, External catheter Weight: 295 lb 6.7 oz (134 kg) Height:  5\' 4"  (162.6 cm)  BEHAVIORAL SYMPTOMS/MOOD NEUROLOGICAL BOWEL NUTRITION STATUS      Continent Diet  AMBULATORY STATUS COMMUNICATION OF NEEDS Skin   Limited Assist Verbally                         Personal Care Assistance Level of Assistance  Bathing, Feeding, Dressing Bathing Assistance: Limited assistance Feeding assistance: Independent Dressing Assistance: Limited assistance     Functional Limitations Info  Sight, Hearing, Speech Sight Info: Impaired Hearing Info: Impaired Speech Info: Adequate    SPECIAL CARE FACTORS FREQUENCY  PT (By licensed PT), OT (By licensed OT)     PT Frequency: 5x/week OT Frequency: 5x/week            Contractures Contractures Info: Not present    Additional Factors Info  Code Status, Allergies, Psychotropic, Insulin Sliding Scale Code Status Info: DNR Allergies Info: Bupropion Hcl   Penicillins   Citalopram Hydrobromide   Metformin And Related   Blue Dyes (Parenteral)   [place]   Gold-containing Drug Products   Nickel  Zinc Psychotropic Info: Zoloft Insulin Sliding Scale Info: See DC Summary       Current Medications (02/15/2022):  This is the current hospital active medication list Current Facility-Administered Medications  Medication Dose Route Frequency Provider Last Rate Last Admin   0.9 %  sodium chloride infusion  250 mL Intravenous PRN Wynetta Fines T, MD       acetaminophen (TYLENOL) tablet 650 mg  650 mg Oral Q4H PRN  Wynetta Fines T, MD       apixaban Arne [place]) tablet 2.5 mg  2.5 mg Oral BID Wynetta Fines T, MD   2.5 mg at 02/15/22 0935   atorvastatin (LIPITOR) tablet 10 mg  10 mg Oral Q M,W,F,Su-1800 Wynetta Fines T, MD   10 mg at 02/14/22 6734   atorvastatin (LIPITOR) tablet 20 mg  20 mg Oral Q T,Th,Sat-1800 Arrien, Jimmy Picket, MD   20 mg at 02/13/22 1729   diltiazem (CARDIZEM CD) 24 hr capsule 120 mg  120 mg Oral Daily Arrien, Jimmy Picket, MD   120 mg at 02/15/22 0935   ferric gluconate (FERRLECIT) 250 mg in sodium chloride 0.9 % 250 mL IVPB  250 mg Intravenous Daily Domenic Polite, MD 135 mL/hr at 02/15/22 1020 250 mg at 02/15/22 1020   fluocinonide cream (LIDEX) 1.93 % 1 application  1 application Topical Daily PRN Wynetta Fines T, MD       furosemide (LASIX) injection 80 mg  80 mg Intravenous Q12H Domenic Polite, MD       gabapentin (NEURONTIN) capsule 100 mg  100 mg Oral TID Wynetta Fines T, MD   100 mg at 02/15/22 0935   insulin aspart (novoLOG) injection 0-5 Units  0-5 Units Subcutaneous QHS Wynetta Fines T, MD   2 Units at 02/14/22 2239   insulin aspart (novoLOG) injection 0-9 Units  0-9 Units Subcutaneous TID WC Wynetta Fines T, MD   5 Units at 02/15/22 1228   insulin glargine-yfgn (SEMGLEE) injection 15 Units  15 Units Subcutaneous Daily Domenic Polite, MD   15 Units at 02/15/22 1020   iron polysaccharides (NIFEREX) capsule 150 mg  150 mg Oral Daily Wynetta Fines T, MD   150 mg at 02/15/22 0935   levalbuterol (XOPENEX) nebulizer solution 0.63 mg  0.63 mg Nebulization QID Domenic Polite, MD   0.63 mg at 02/15/22 1027   loratadine (CLARITIN) tablet 10 mg  10 mg Oral Daily Wynetta Fines T, MD   10 mg at 02/15/22 0934   metoprolol tartrate (LOPRESSOR) injection 2.5 mg  2.5 mg Intravenous Q6H PRN Wynetta Fines T, MD       metoprolol tartrate (LOPRESSOR) tablet 25 mg  25 mg Oral BID Tawni Millers, MD   25 mg at 02/15/22 0935   ondansetron (ZOFRAN) injection 4 mg  4 mg Intravenous Q6H PRN Wynetta Fines T,  MD       pantoprazole (PROTONIX) EC tablet 40 mg  40 mg Oral Daily Wynetta Fines T, MD   40 mg at 02/15/22 0935   sertraline (ZOLOFT) tablet 100 mg  100 mg Oral Daily Wynetta Fines T, MD   100 mg at 02/15/22 0935   sodium chloride (OCEAN) 0.65 % nasal spray 1 spray  1 spray Each Nare PRN Wynetta Fines T, MD   1 spray at 02/13/22 0641   sodium chloride flush (NS) 0.9 % injection 3 mL  3 mL Intravenous Q12H Wynetta Fines T, MD   3 mL at 02/14/22 2241   sodium chloride flush (NS) 0.9 %  injection 3 mL  3 mL Intravenous PRN Wynetta Fines T, MD       umeclidinium bromide (INCRUSE ELLIPTA) 62.5 MCG/ACT 1 puff  1 puff Inhalation Daily Donnamae Jude, [facility]   1 puff at 02/15/22 9872     Discharge Medications: Please see discharge summary for a list of discharge medications.  Relevant Imaging Results:  Relevant Lab Results:   Additional Information SSN [id number]. [place] COVID-19 Vaccine 08/03/2021 , 10/19/2020 , 02/10/2020 , 01/20/2020  Reece Agar, LCSWA

## 2022-02-15 NOTE — Progress Notes (Signed)
PROGRESS NOTE    Katelyn Mahoney  YJE:[id number] DOB: 1934-03-10 DOA: 02/12/2022 PCP: Ann Held, DO  Narrative [name] with history of persistent A-fib, diastolic CHF, type 2 diabetes mellitus, obesity, was brought to the ED with worsening dyspnea and hypoxia, in the ED she was in A-fib RVR with heart rate in the 120s, chest x-ray noted CHF, has been on 4L O2 since december -Admitted, started on diuretics, underwent cardioversion on 2/14  Subjective: Still tired and short of breath with minimal activity  Assessment & Plan  Acute on chronic hypoxic respiratory failure  acute on chronic diastolic CHF -Multifactorial respiratory failure secondary to CHF-worsened by A-fib, obesity,?  COPD (heavy 2ndary exposure to smoking-has been on 4L O2 for 1-35months -Cardioverted to sinus rhythm on 2/14 -Urine output not charted accurately yesterday -Remains volume overloaded, continue IV Lasix, kidney function slowly improving -Continue metoprolol -Social work consulted for short-term rehab  Atrial fibrillation with RVR -Cardioverted to sinus rhythm 2/14, continue metoprolol and diltiazem, in sinus rhythm now -Continue apixaban  CKD (chronic kidney disease), stage IV ([place])- (present on admission) -Baseline creatinine around 1.6-2 -Creatinine trended up to 2.8, now improving, creatinine down to 2.3 today -Suspect cardiorenal, continue diuretics today, BMP in a.m.  Acute on chronic anemia -Baseline hemoglobin around 8, anemia panel in January with severe iron deficiency -Hemoglobin down to 7.8 today, started IV iron -Patient denies any overt bleeding, has not had an EGD and colonoscopy in January 01, 2001 3 which were unrevealing -Monitor  Obesity, Class III, BMI 40-49.9 (morbid obesity) ([place])- (present on admission) Calculated BMI is 50.6  Diabetes mellitus ([place]) -Uncontrolled, add Semglee, sliding scale insulin  DVT prophylaxis: Apixaban Code Status: DNR Family Communication:  Discussed with patient in detail, no family at bedside Disposition Plan: Inpatient  Consultants:  Cardiology  Procedures: DC cardioversion 2/14  Antimicrobials:    Objective: Vitals:   02/15/22 0900 02/15/22 1000 02/15/22 1200 02/15/22 1355  BP: (!) 127/98 (!) 127/98    Pulse: 74 79 76   Resp: (!) 26 (!) 22 (!) 23   Temp:  98.3 F (36.8 C)    TempSrc:      SpO2:  96% 97% 100%  Weight:      Height:        Intake/Output Summary (Last 24 hours) at 02/15/2022 1517 Last data filed at 02/15/2022 0600 Gross per 24 hour  Intake 692.63 ml  Output 400 ml  Net 292.63 ml   Filed Weights   02/13/22 1047 02/14/22 0331 02/15/22 0300  Weight: 133.8 kg 133.9 kg 134 kg    Examination:  General exam: Obese chronically ill female sitting up in bed, AAOx3, no distress HEENT: Positive JVD CVS: S1-S2, regular rhythm Lungs: Few basilar rales, otherwise clear, improving air movement Abdomen: Soft, nontender, bowel sounds present Extremities: 1+ edema  Skin: No rashes Psychiatry: Judgement and insight appear normal. Mood & affect appropriate.     Data Reviewed:   CBC: Recent Labs  Lab 02/12/22 1419 02/14/22 0355 02/15/22 0408  WBC 11.1* 10.3 10.0  NEUTROABS 7.4  --   --   HGB 8.0* 7.8* 7.8*  HCT 29.5* 28.3* 26.7*  MCV 105.7* 104.4* 99.6  PLT 507* 469* 637*   Basic Metabolic Panel: Recent Labs  Lab 02/12/22 1419 02/13/22 0039 02/14/22 0355 02/15/22 0408  NA 139 141 139 137  K 4.0 4.2 3.8 3.6  CL 97* 96* 97* 96*  CO2 30 33* 31 31  GLUCOSE 173* 222* 188* 177*  BUN  66* 65* 64* 62*  CREATININE 2.66* 2.88* 2.45* 2.39*  CALCIUM 9.3 9.3 8.8* 8.6*  MG  --   --  2.6*  --    GFR: Estimated Creatinine Clearance: 23.5 mL/min (A) (by C-G formula based on SCr of 2.39 mg/dL (H)). Liver Function Tests: Recent Labs  Lab 02/12/22 1419  AST 19  ALT 16  ALKPHOS 70  BILITOT 0.8  PROT 6.5  ALBUMIN 3.1*   No results for input(s): LIPASE, AMYLASE in the last 168 hours. No  results for input(s): AMMONIA in the last 168 hours. Coagulation Profile: Recent Labs  Lab 02/13/22 0039  INR 1.5*   Cardiac Enzymes: No results for input(s): CKTOTAL, CKMB, CKMBINDEX, TROPONINI in the last 168 hours. BNP (last 3 results) No results for input(s): PROBNP in the last 8760 hours. HbA1C: No results for input(s): HGBA1C in the last 72 hours. CBG: Recent Labs  Lab 02/14/22 1129 02/14/22 1620 02/14/22 2129 02/15/22 0632 02/15/22 1144  GLUCAP 289* 220* 211* 158* 284*   Lipid Profile: No results for input(s): CHOL, HDL, LDLCALC, TRIG, CHOLHDL, LDLDIRECT in the last 72 hours. Thyroid Function Tests: No results for input(s): TSH, T4TOTAL, FREET4, T3FREE, THYROIDAB in the last 72 hours. Anemia Panel: Recent Labs    02/13/22 0039  FERRITIN 38  TIBC 475*  IRON 17*  RETICCTPCT 4.3*   Urine analysis:    Component Value Date/Time   COLORURINE YELLOW 01/21/2022 0949   APPEARANCEUR HAZY (A) 01/21/2022 0949   LABSPEC 1.008 01/21/2022 0949   PHURINE 5.0 01/21/2022 0949   GLUCOSEU NEGATIVE 01/21/2022 0949   HGBUR NEGATIVE 01/21/2022 0949   BILIRUBINUR NEGATIVE 01/21/2022 0949   BILIRUBINUR Negative 03/09/2021 0852   KETONESUR NEGATIVE 01/21/2022 0949   PROTEINUR NEGATIVE 01/21/2022 0949   UROBILINOGEN 0.2 03/09/2021 0852   UROBILINOGEN 0.2 03/10/2009 0801   NITRITE NEGATIVE 01/21/2022 0949   LEUKOCYTESUR MODERATE (A) 01/21/2022 0949   Sepsis Labs: @LABRCNTIP (procalcitonin:4,lacticidven:4)  ) Recent Results (from the past 240 hour(s))  Resp Panel by RT-PCR (Flu A&B, Covid) Nasopharyngeal Swab     Status: None   Collection Time: 02/12/22  2:27 PM   Specimen: Nasopharyngeal Swab; Nasopharyngeal(NP) swabs in vial transport medium  Result Value Ref Range Status   SARS Coronavirus 2 by RT PCR NEGATIVE NEGATIVE Final    Comment: (NOTE) SARS-CoV-2 target nucleic acids are NOT DETECTED.  The SARS-CoV-2 RNA is generally detectable in upper respiratory specimens  during the acute phase of infection. The lowest concentration of SARS-CoV-2 viral copies this assay can detect is 138 copies/mL. A negative result does not preclude SARS-Cov-2 infection and should not be used as the sole basis for treatment or other patient management decisions. A negative result may occur with  improper specimen collection/handling, submission of specimen other than nasopharyngeal swab, presence of viral mutation(s) within the areas targeted by this assay, and inadequate number of viral copies(<138 copies/mL). A negative result must be combined with clinical observations, patient history, and epidemiological information. The expected result is Negative.  Fact Sheet for Patients:  EntrepreneurPulse.com.au  Fact Sheet for Healthcare Providers:  IncredibleEmployment.be  This test is no t yet approved or cleared by the [place] FDA and  has been authorized for detection and/or diagnosis of SARS-CoV-2 by FDA under an Emergency Use Authorization (EUA). This EUA will remain  in effect (meaning this test can be used) for the duration of the COVID-19 declaration under Section 564(b)(1) of the Act, 21 U.S.C.section 360bbb-3(b)(1), unless the authorization is terminated  or revoked  sooner.       Influenza A by PCR NEGATIVE NEGATIVE Final   Influenza B by PCR NEGATIVE NEGATIVE Final    Comment: (NOTE) The Xpert Xpress SARS-CoV-2/FLU/RSV plus assay is intended as an aid in the diagnosis of influenza from Nasopharyngeal swab specimens and should not be used as a sole basis for treatment. Nasal washings and aspirates are unacceptable for Xpert Xpress SARS-CoV-2/FLU/RSV testing.  Fact Sheet for Patients: EntrepreneurPulse.com.au  Fact Sheet for Healthcare Providers: IncredibleEmployment.be  This test is not yet approved or cleared by the [place] FDA and has been authorized for detection  and/or diagnosis of SARS-CoV-2 by FDA under an Emergency Use Authorization (EUA). This EUA will remain in effect (meaning this test can be used) for the duration of the COVID-19 declaration under Section 564(b)(1) of the Act, 21 U.S.C. section 360bbb-3(b)(1), unless the authorization is terminated or revoked.  Performed at [facility], [place] [address] [place],  [address]      Radiology Studies: No results found.   Scheduled Meds:  apixaban  2.5 mg Oral BID   atorvastatin  10 mg Oral Q M,W,F,Su-1800   atorvastatin  20 mg Oral Q T,Th,Sat-1800   diltiazem  120 mg Oral Daily   furosemide  80 mg Intravenous Q12H   gabapentin  100 mg Oral TID   insulin aspart  0-5 Units Subcutaneous QHS   insulin aspart  0-9 Units Subcutaneous TID WC   insulin glargine-yfgn  15 Units Subcutaneous Daily   iron polysaccharides  150 mg Oral Daily   levalbuterol  0.63 mg Nebulization QID   loratadine  10 mg Oral Daily   metoprolol tartrate  25 mg Oral BID   pantoprazole  40 mg Oral Daily   sertraline  100 mg Oral Daily   sodium chloride flush  3 mL Intravenous Q12H   umeclidinium bromide  1 puff Inhalation Daily   Continuous Infusions:  sodium chloride     ferric gluconate (FERRLECIT) IVPB 250 mg (02/15/22 1020)     LOS: 3 days    Time spent: 63min    Domenic Polite, MD [facility]   02/15/2022, 3:17 PM

## 2022-02-15 NOTE — Progress Notes (Signed)
°   02/15/22 1000  Assess: MEWS Score  Temp 98.3 F (36.8 C)  BP (!) 127/98  Pulse Rate 79  ECG Heart Rate 79  Resp (!) 22  Level of Consciousness Alert  SpO2 96 %  O2 Device Nasal Cannula  Patient Activity (if Appropriate) In bed  Assess: if the MEWS score is Yellow or Red  Were vital signs taken at a resting state? Yes  Focused Assessment No change from prior assessment  Early Detection of Sepsis Score *See Row Information* Low  MEWS guidelines implemented *See Row Information* No, vital signs rechecked  Treat  MEWS Interventions Administered scheduled meds/treatments  Pain Scale 0-10  Pain Score 0  Take Vital Signs  Increase Vital Sign Frequency  Yellow: Q 2hr X 2 then Q 4hr X 2, if remains yellow, continue Q 4hrs  Notify: Charge Nurse/RN  Name of Charge Nurse/RN Notified Beverlee Nims R  Date Charge Nurse/RN Notified 02/15/22  Time Charge Nurse/RN Notified 1011  Notify: Provider  Provider response At bedside (order Breathing Treatment)  Notify: Rapid Response  Name of Rapid Response RN Notified n/a  Document  Patient Outcome Not stable and remains on department  Progress note created (see row info) Yes

## 2022-02-15 NOTE — Care Management Important Message (Signed)
Important Message  Patient Details  Name: Katelyn Mahoney MRN: [id number] Date of Birth: [birth year]   Medicare Important Message Given:  Yes     Shelda Altes 02/15/2022, 9:27 AM

## 2022-02-15 NOTE — Progress Notes (Addendum)
Subjective:  Resting peacefully this morning  S/p cardioversion 2/14  Urine output documentation remains unreliable  Objective:  Vital Signs in the last 24 hours: Temp:  [98.1 F (36.7 C)-98.3 F (36.8 C)] 98.3 F (36.8 C) (02/16 1000) Pulse Rate:  [71-79] 76 (02/16 1200) Resp:  [20-30] 23 (02/16 1200) BP: (104-151)/(41-130) 127/98 (02/16 1000) SpO2:  [94 %-100 %] 100 % (02/16 1355) Weight:  [134 kg] 134 kg (02/16 0300)  Intake/Output from previous day: 02/15 0701 - 02/16 0700 In: 1172.6 [P.O.:900; IV Piggyback:272.6] Out: 800 [Urine:800]  Physical Exam Vitals and nursing note reviewed.  Constitutional:      General: She is not in acute distress. Neck:     Vascular: No JVD.  Cardiovascular:     Rate and Rhythm: Normal rate and regular rhythm.     Heart sounds: Normal heart sounds. No murmur heard. Pulmonary:     Effort: Pulmonary effort is normal.     Breath sounds: Wheezing present. No rales.  Musculoskeletal:     Right lower leg: Edema (Ace wraps in place) present.     Left lower leg: Edema (Ace wraps in place) present.     Lab Results: Reviewed and interpreted: BMP  Imaging/tests reviewed and independently interpreted: EKG 02/13/2022: Sinus rhythm w/PACs  Cardiac Studies (Historical data):  EKG 02/13/2022: Sinus rhythm w/PACs  DCCV 02/13/2022  EKG 02/08/2022: Atrial fibrillation 114 bpm Frequent PVCs  Nonspecific T-abnormality  Low voltage   Echocardiogram 01/22/2022:  1. ? left pleural effusion.   2. Left ventricular ejection fraction, by estimation, is 50 to 55%. The  left ventricle has low normal function. The left ventricle has no regional  wall motion abnormalities. Left ventricular diastolic parameters are  indeterminate.   3. Right ventricular systolic function is mildly reduced. The right  ventricular size is mildly enlarged.   4. Left atrial size was moderately dilated.   5. Right atrial size was moderately dilated.   6. The mitral valve  is abnormal. Moderate mitral valve regurgitation. No  evidence of mitral stenosis.   7. Tricuspid valve regurgitation is moderate.   8. The aortic valve is tricuspid. There is moderate calcification of the  aortic valve. There is moderate thickening of the aortic valve. Aortic  valve regurgitation is trivial. Mild aortic valve stenosis.   9. The inferior vena cava is dilated in size with >50% respiratory  variability, suggesting right atrial pressure of 8 mmHg.    Echocardiogram 12/13/2021:  1. Left ventricular ejection fraction, by estimation, is 50 to 55%. The  left ventricle has low normal function. The left ventricle has no regional  wall motion abnormalities. Left ventricular diastolic parameters are  consistent with Grade II diastolic  dysfunction (pseudonormalization).   2. Right ventricular systolic function is normal. The right ventricular  size is normal. There is moderately elevated pulmonary artery systolic  pressure. The estimated right ventricular systolic pressure is 61.6 mmHg.   3. A small pericardial effusion is present. The pericardial effusion is  circumferential.   4. The mitral valve is degenerative. Moderate mitral valve regurgitation.  No evidence of mitral stenosis.   5. The aortic valve is tricuspid. There is mild calcification of the  aortic valve. There is mild thickening of the aortic valve. Aortic valve  regurgitation is trivial. Aortic valve sclerosis/calcification is present,  without any evidence of aortic  stenosis.   6. The inferior vena cava is dilated in size with <50% respiratory  variability, suggesting right atrial pressure of 15 mmHg.  CT Chest 01/25/2022: Small bilateral pleural effusions are noted with adjacent subsegmental atelectasis.   7 mm subpleural nodule seen in right upper lobe. Non-contrast chest CT at 6-12 months is recommended. If the nodule is stable at time of repeat CT, then future CT at 18-24 months (from today's scan)  is considered optional for low-risk patients, but is recommended for high-risk patients. This recommendation follows the consensus statement: Guidelines for Management of Incidental Pulmonary Nodules Detected on CT Images: From the Fleischner Society 2017; Radiology 2017; 284:228-243.   Mild mosaic pattern is noted throughout both lungs suggesting air trapping secondary to small airways disease or potentially mild multifocal inflammation.   Mild coronary calcifications are noted.   Aortic Atherosclerosis (ICD10-I70.0).   Assessment & Recommendations:   88 year old Caucasian female with hypertension, type 2 diabetes mellitus, obesity, persistent A-fib, progressive CKD, admitted with acute hypoxic respiratory failure, acute on chronic HFpEF   Acute on chronic hypoxic respiratory failure: Likely multifactorial, including progression of chronic respiratory failure due to HFpEF, obesity, deconditioning, AKI/CKD A-fib certainly contributing, see below. Remains in respiratory failure and acute on chronic HFpEF. Continue IV lasix 60 mg bid Cr has improved to 2.39 and probably reaching a plateau.  Needs strict I/O  Persistent A-fib: Now in sinus rhythm s/p cardioversion 02/13/2022 Continue current rate control therapy [id number] score high.  Continue Eliquis 2.5 mg twice daily.    She will need home oxygen and short term rehab  Rest of the medical management as per the primary team.   Discussed interpretation of tests and management recommendations with the primary team       Nigel Mormon, MD Pager: [phone] Office: [phone]

## 2022-02-15 NOTE — NC FL2 (Deleted)
[place] LEVEL OF CARE SCREENING TOOL     IDENTIFICATION  Patient Name: Katelyn Mahoney Birthdate: 1934/09/03 Sex: female Admission Date (Current Location): 02/12/2022  [facility] and [place] Number:      Facility and Address:         Provider Number:    Attending Physician Name and Address:  Domenic Polite, MD  Relative Name and Phone Number:       Current Level of Care:   Recommended Level of Care:   Prior Approval Number:    Date Approved/Denied:   PASRR Number:    Discharge Plan:      Current Diagnoses: Patient Active Problem List   Diagnosis Date Noted   Pulmonary nodule 02/14/2022   Chronic atrial fibrillation with RVR ([place]) 02/13/2022   CKD (chronic kidney disease), stage IV ([place]) 02/13/2022   CHF (congestive heart failure) ([place]) 02/12/2022   Acute on chronic respiratory failure with hypoxia ([place]) 02/08/2022   Gastrointestinal hemorrhage    Persistent atrial fibrillation ([place])    Acute exacerbation of CHF (congestive heart failure) ([place]) 01/09/2022   Atrial fibrillation with RVR ([place])    Acute congestive heart failure ([place])    Urinary tract infection 12/13/2021   AKI (acute kidney injury) ([place]) 12/13/2021   At risk for sepsis 12/13/2021   Primary osteoarthritis of both knees 03/09/2021   Urinary frequency 03/09/2021   Depression, major, single episode, in partial remission ([place]) 03/09/2021   Edema of lower extremity 08/20/2017   Hyperlipidemia 08/20/2017   CKD stage G3b/A1, GFR 30-44 and albumin creatinine ratio <30 mg/g (HCC) 02/27/2017   Allergic urticaria 08/02/2016   Fungal dermatitis 11/08/2015   Right leg pain 10/14/2015   Physical exam 11/09/2014   Dermatophytosis of nail 09/24/2013   Arthritis    Claw toe (acquired) 04/17/2013   Hammer toe, acquired 04/17/2013   Spherocytosis, hereditary ([place]) 08/30/2011   Diabetes mellitus ([place]) 10/17/2010   Obesity, Class III, BMI 40-49.9 (morbid obesity) ([place]) 03/19/2008   Primary  hypertension 03/19/2008   ROTATOR CUFF SPRAIN AND STRAIN 03/19/2008   Hyperlipidemia associated with type 2 diabetes mellitus ([place]) 09/05/2007   Depression 09/05/2007   ROSACEA 05/19/2007    Orientation RESPIRATION BLADDER Height & Weight            Weight: 295 lb 6.7 oz (134 kg) Height:  5\' 4"  (162.6 cm)  BEHAVIORAL SYMPTOMS/MOOD NEUROLOGICAL BOWEL NUTRITION STATUS           AMBULATORY STATUS COMMUNICATION OF NEEDS Skin   Limited Assist                           Personal Care Assistance Level of Assistance    Bathing Assistance: Limited assistance Feeding assistance: Independent Dressing Assistance: Limited assistance     Functional Limitations Info  Sight, Hearing, Speech Sight Info: Impaired Hearing Info: Impaired Speech Info: Adequate    SPECIAL CARE FACTORS FREQUENCY                       Contractures      Additional Factors Info                  Current Medications (02/15/2022):  This is the current hospital active medication list Current Facility-Administered Medications  Medication Dose Route Frequency Provider Last Rate Last Admin   0.9 %  sodium chloride infusion  250 mL Intravenous PRN Lequita Halt, MD  acetaminophen (TYLENOL) tablet 650 mg  650 mg Oral Q4H PRN Wynetta Fines T, MD       apixaban Arne Cleveland) tablet 2.5 mg  2.5 mg Oral BID Wynetta Fines T, MD   2.5 mg at 02/15/22 0935   atorvastatin (LIPITOR) tablet 10 mg  10 mg Oral Q M,W,F,Su-1800 Wynetta Fines T, MD   10 mg at 02/14/22 1308   atorvastatin (LIPITOR) tablet 20 mg  20 mg Oral Q T,Th,Sat-1800 Arrien, Jimmy Picket, MD   20 mg at 02/13/22 1729   diltiazem (CARDIZEM CD) 24 hr capsule 120 mg  120 mg Oral Daily Arrien, Jimmy Picket, MD   120 mg at 02/15/22 0935   ferric gluconate (FERRLECIT) 250 mg in sodium chloride 0.9 % 250 mL IVPB  250 mg Intravenous Daily Domenic Polite, MD 135 mL/hr at 02/15/22 1020 250 mg at 02/15/22 1020   fluocinonide cream (LIDEX) 6.57 % 1  application  1 application Topical Daily PRN Wynetta Fines T, MD       furosemide (LASIX) injection 80 mg  80 mg Intravenous Q12H Domenic Polite, MD       gabapentin (NEURONTIN) capsule 100 mg  100 mg Oral TID Wynetta Fines T, MD   100 mg at 02/15/22 0935   insulin aspart (novoLOG) injection 0-5 Units  0-5 Units Subcutaneous QHS Wynetta Fines T, MD   2 Units at 02/14/22 2239   insulin aspart (novoLOG) injection 0-9 Units  0-9 Units Subcutaneous TID WC Lequita Halt, MD   5 Units at 02/15/22 1228   insulin glargine-yfgn (SEMGLEE) injection 15 Units  15 Units Subcutaneous Daily Domenic Polite, MD   15 Units at 02/15/22 1020   iron polysaccharides (NIFEREX) capsule 150 mg  150 mg Oral Daily Wynetta Fines T, MD   150 mg at 02/15/22 0935   levalbuterol (XOPENEX) nebulizer solution 0.63 mg  0.63 mg Nebulization QID Domenic Polite, MD   0.63 mg at 02/15/22 1027   loratadine (CLARITIN) tablet 10 mg  10 mg Oral Daily Wynetta Fines T, MD   10 mg at 02/15/22 0934   metoprolol tartrate (LOPRESSOR) injection 2.5 mg  2.5 mg Intravenous Q6H PRN Wynetta Fines T, MD       metoprolol tartrate (LOPRESSOR) tablet 25 mg  25 mg Oral BID Tawni Millers, MD   25 mg at 02/15/22 0935   ondansetron (ZOFRAN) injection 4 mg  4 mg Intravenous Q6H PRN Wynetta Fines T, MD       pantoprazole (PROTONIX) EC tablet 40 mg  40 mg Oral Daily Wynetta Fines T, MD   40 mg at 02/15/22 0935   sertraline (ZOLOFT) tablet 100 mg  100 mg Oral Daily Wynetta Fines T, MD   100 mg at 02/15/22 0935   sodium chloride (OCEAN) 0.65 % nasal spray 1 spray  1 spray Each Nare PRN Wynetta Fines T, MD   1 spray at 02/13/22 0641   sodium chloride flush (NS) 0.9 % injection 3 mL  3 mL Intravenous Q12H Wynetta Fines T, MD   3 mL at 02/14/22 2241   sodium chloride flush (NS) 0.9 % injection 3 mL  3 mL Intravenous PRN Wynetta Fines T, MD       umeclidinium bromide (INCRUSE ELLIPTA) 62.5 MCG/ACT 1 puff  1 puff Inhalation Daily Donnamae Jude, RPH   1 puff at 02/15/22 8469      Discharge Medications: Please see discharge summary for a list of discharge medications.  Relevant Imaging Results:  Relevant Lab  Results:   Additional Information SSN [id number]. [place] COVID-19 Vaccine 08/03/2021 , 10/19/2020 , 02/10/2020 , 01/20/2020  Reece Agar, LCSWA

## 2022-02-15 NOTE — Progress Notes (Signed)
Mobility Specialist Progress Note:   02/15/22 1540  Mobility  Activity Ambulated with assistance in room  Level of Assistance Minimal assist, patient does 75% or more  Assistive Device Front wheel walker  Distance Ambulated (ft) 30 ft  Activity Response Tolerated well  $Mobility charge 1 Mobility   Pt eager to get back in bed, agreed to ambulate prior to. Required SV throughout ambulation, minA to help legs onto bed at end of session. Pt with c/o SOB, SpO2 84%, recovered with pursed lip breathing and rest. Left pt in bed with bed alarm on, all needs met.  Nelta Numbers Acute Rehabilitation Services Phone: [phone] Office Phone: [phone]

## 2022-02-15 NOTE — TOC Initial Note (Signed)
Transition of Care [facility]) - Initial/Assessment Note    Patient Details  Name: Katelyn Mahoney MRN: [id number] Date of Birth: [birth year]  [facility]) CM/SW Contact:    Tresa Endo Phone Number: 02/15/2022, 1:12 PM  Clinical Narrative:                 CSW spoke with pt and daughter in room about pt needing to go to a SNF. Pt has attempted to get into SNF's before but she has had problems with her insurance in the past. Pt stated she would have her daughter provide a list of approved facilities form her insurance. Pt lived at home with with her daughter and her daughter works so she is not there during the day. PT recommends SNF, pt desaturates with minimal activity, needs assistance with IADL's and has a click score of 15. Pt and daughter gave CSW permission to fax out, pt was given medicare.gov list.  Expected Discharge Plan: Skilled Nursing Facility Barriers to Discharge: Continued Medical Work up   Patient Goals and CMS Choice Patient states their goals for this hospitalization and ongoing recovery are:: Rehab CMS Medicare.gov Compare Post Acute Care list provided to:: Patient Choice offered to / list presented to : Patient  Expected Discharge Plan and Services Expected Discharge Plan: [place] In-house Referral: Clinical Social Work Discharge Planning Services: CM Consult Post Acute Care Choice: [place] arrangements for the past 2 months: [place]                   DME Agency: NA       HH Arranged: Therapist, sports, PT, Nurse's Aide, Social Work, OT [place] Agency: [place] Date [place]: 02/14/22 Time [place]: 68 Representative spoke with at [place]  Prior Living Arrangements/Services Living arrangements for the past 2 months: [place] Lives with:: Adult Children Patient language and need for interpreter reviewed:: Yes Do you feel safe going back to the  place where you live?: Yes      Need for Family Participation in Patient Care: Yes (Comment) Care giver support system in place?: Yes (comment) Current home services: DME (home oxgy[employer] (4) liters) Criminal Activity/Legal Involvement Pertinent to Current Situation/Hospitalization: No - Comment as needed  Activities of Daily Living Home Assistive Devices/Equipment: Hand-held shower hose, Eyeglasses, Cane (specify quad or straight) ADL Screening (condition at time of admission) Patient's cognitive ability adequate to safely complete daily activities?: Yes Is the patient deaf or have difficulty hearing?: No Does the patient have difficulty seeing, even when wearing glasses/contacts?: No Does the patient have difficulty concentrating, remembering, or making decisions?: No Patient able to express need for assistance with ADLs?: Yes Does the patient have difficulty dressing or bathing?: Yes Independently performs ADLs?: Yes (appropriate for developmental age) Does the patient have difficulty walking or climbing stairs?: Yes Weakness of Legs: Both Weakness of Arms/Hands: None  Permission Sought/Granted Permission sought to share information with : Facility Sport and exercise psychologist, Family Supports Permission granted to share information with : Yes, Verbal Permission Granted  Share Information with NAME: Mariann Laster  Permission granted to share info w AGENCY: SNFs  Permission granted to share info w Relationship: Daughter  Permission granted to share info w Contact Information: [phone]  Emotional Assessment Appearance:: Appears stated age Attitude/Demeanor/Rapport: Engaged Affect (typically observed): Accepting, Appropriate Orientation: : Oriented to Self, Oriented to Place, Oriented to  Time, Oriented to Situation Alcohol / Substance Use: Not  Applicable Psych Involvement: No (comment)  Admission diagnosis:  CHF (congestive heart failure) (HCC) [I50.9] Acute on chronic diastolic congestive  heart failure (HCC) [I50.33] Longstanding persistent atrial fibrillation (HCC) [I48.11] Patient Active Problem List   Diagnosis Date Noted   Pulmonary nodule 02/14/2022   Chronic atrial fibrillation with RVR ([place]) 02/13/2022   CKD (chronic kidney disease), stage IV ([place]) 02/13/2022   CHF (congestive heart failure) ([place]) 02/12/2022   Acute on chronic respiratory failure with hypoxia ([place]) 02/08/2022   Gastrointestinal hemorrhage    Persistent atrial fibrillation (HCC)    Acute exacerbation of CHF (congestive heart failure) ([place]) 01/09/2022   Atrial fibrillation with RVR (HCC)    Acute congestive heart failure ([place])    Urinary tract infection 12/13/2021   AKI (acute kidney injury) ([place]) 12/13/2021   At risk for sepsis 12/13/2021   Primary osteoarthritis of both knees 03/09/2021   Urinary frequency 03/09/2021   Depression, major, single episode, in partial remission ([place]) 03/09/2021   Edema of lower extremity 08/20/2017   Hyperlipidemia 08/20/2017   CKD stage G3b/A1, GFR 30-44 and albumin creatinine ratio <30 mg/g (HCC) 02/27/2017   Allergic urticaria 08/02/2016   Fungal dermatitis 11/08/2015   Right leg pain 10/14/2015   Physical exam 11/09/2014   Dermatophytosis of nail 09/24/2013   Arthritis    Claw toe (acquired) 04/17/2013   Hammer toe, acquired 04/17/2013   Spherocytosis, hereditary ([place]) 08/30/2011   Diabetes mellitus ([place]) 10/17/2010   Obesity, Class III, BMI 40-49.9 (morbid obesity) ([place]) 03/19/2008   Primary hypertension 03/19/2008   ROTATOR CUFF SPRAIN AND STRAIN 03/19/2008   Hyperlipidemia associated with type 2 diabetes mellitus ([place]) 09/05/2007   Depression 09/05/2007   ROSACEA 05/19/2007   PCP:  Ann Held, DO Pharmacy:   [facility] [place], [place] - [address] [place] [address] Phone: [phone] Fax: [phone]  [facility] - [place], [place] [address] [place] [address] Phone: [phone] Fax: [phone]     Social Determinants of Health (SDOH) Interventions    Readmission Risk Interventions Readmission Risk Prevention Plan 02/14/2022 01/26/2022  Transportation Screening Complete Complete  PCP or Specialist Appt within 3-5 Days Complete Complete  HRI or [place] Complete Complete  Social Work Consult for [place] Planning/Counseling Complete Complete  Palliative Care Screening Not Applicable Not Applicable  Medication Review Press photographer) Complete Complete  Some recent data might be hidden

## 2022-02-16 DIAGNOSIS — I4819 Other persistent atrial fibrillation: Secondary | ICD-10-CM | POA: Diagnosis not present

## 2022-02-16 DIAGNOSIS — I5033 Acute on chronic diastolic (congestive) heart failure: Secondary | ICD-10-CM | POA: Diagnosis not present

## 2022-02-16 LAB — BASIC METABOLIC PANEL
Anion gap: 10 (ref 5–15)
BUN: 58 mg/dL — ABNORMAL HIGH (ref 8–23)
CO2: 33 mmol/L — ABNORMAL HIGH (ref 22–32)
Calcium: 8.8 mg/dL — ABNORMAL LOW (ref 8.9–10.3)
Chloride: 97 mmol/L — ABNORMAL LOW (ref 98–111)
Creatinine, Ser: 2.36 mg/dL — ABNORMAL HIGH (ref 0.44–1.00)
GFR, Estimated: 20 mL/min — ABNORMAL LOW (ref >60)
Glucose, Bld: 205 mg/dL — ABNORMAL HIGH (ref 70–99)
Potassium: 3.6 mmol/L (ref 3.5–5.1)
Sodium: 140 mmol/L (ref 135–145)

## 2022-02-16 LAB — CBC
HCT: 27.8 % — ABNORMAL LOW (ref 36.0–46.0)
Hemoglobin: 7.8 g/dL — ABNORMAL LOW (ref 12.0–15.0)
MCH: 28.8 pg (ref 26.0–34.0)
MCHC: 28.1 g/dL — ABNORMAL LOW (ref 30.0–36.0)
MCV: 102.6 fL — ABNORMAL HIGH (ref 80.0–100.0)
Platelets: 448 10*3/uL — ABNORMAL HIGH (ref 150–400)
RBC: 2.71 MIL/uL — ABNORMAL LOW (ref 3.87–5.11)
RDW: 16.1 % — ABNORMAL HIGH (ref 11.5–15.5)
WBC: 11.5 10*3/uL — ABNORMAL HIGH (ref 4.0–10.5)
nRBC: 1.9 % — ABNORMAL HIGH (ref 0.0–0.2)

## 2022-02-16 LAB — GLUCOSE, CAPILLARY
Glucose-Capillary: 193 mg/dL — ABNORMAL HIGH (ref 70–99)
Glucose-Capillary: 256 mg/dL — ABNORMAL HIGH (ref 70–99)
Glucose-Capillary: 179 mg/dL — ABNORMAL HIGH (ref 70–99)
Glucose-Capillary: 195 mg/dL — ABNORMAL HIGH (ref 70–99)

## 2022-02-16 MED ORDER — LEVALBUTEROL HCL 0.63 MG/3ML IN NEBU
0.6300 mg | INHALATION_SOLUTION | Freq: Four times a day (QID) | RESPIRATORY_TRACT | Status: DC
  Administered 2022-02-16: 0.63 mg via RESPIRATORY_TRACT
  Filled 2022-02-16: qty 3

## 2022-02-16 MED ORDER — LEVALBUTEROL HCL 0.63 MG/3ML IN NEBU
0.6300 mg | INHALATION_SOLUTION | Freq: Four times a day (QID) | RESPIRATORY_TRACT | Status: DC | PRN
  Administered 2022-02-19: 0.63 mg via RESPIRATORY_TRACT
  Filled 2022-02-16: qty 3

## 2022-02-16 MED ORDER — ALBUMIN HUMAN 5 % IV SOLN
25.0000 g | Freq: Four times a day (QID) | INTRAVENOUS | Status: AC
  Administered 2022-02-16 – 2022-02-17 (×3): 25 g via INTRAVENOUS
  Filled 2022-02-16 (×4): qty 500

## 2022-02-16 NOTE — Progress Notes (Signed)
Occupational Therapy Treatment Patient Details Name: Katelyn Mahoney MRN: [id number] DOB: 1934-05-01 Today's Date: 02/16/2022   History of present illness 88 y.o. female admitted 02/14/22 with SOB,  weight gain, uncontrolled A-fib with acute on chronic HFpEF exacerbation. S/p DCCV 2/14. PMhx: admit on 01/08/22 for CHF exacerbation. DM II, HTN, obesity, CKD, HLD, chronic LE edema, chronic hypoxic respiratory failure on 4L O2, Afib, HfpEF   OT comments  Patient continues to make steady progress towards goals in skilled OT session. Patient's session encompassed  functional ambulation to improve overall activity tolerance. Patient completing two laps in hospital room and returning to seated position (oxygen desatting to 83% but rebounding to low 90s without having to increase from 4L). Patient then completing one additional sit<>stand and marching in place from bedside prior to returning to bed. Patient remains very motivated, however recommendation changed to SNF per patients preference. Therapy will continue to follow.    Recommendations for follow up therapy are one component of a multi-disciplinary discharge planning process, led by the attending physician.  Recommendations may be updated based on patient status, additional functional criteria and insurance authorization.    Follow Up Recommendations  Skilled nursing-short term rehab (<3 hours/day)    Assistance Recommended at Discharge Intermittent Supervision/Assistance  Patient can return home with the following  Help with stairs or ramp for entrance;A little help with bathing/dressing/bathroom;A little help with walking and/or transfers;Assistance with cooking/housework;Direct supervision/assist for financial management;Direct supervision/assist for medications management   Equipment Recommendations       Recommendations for Other Services      Precautions / Restrictions Precautions Precautions: Fall;Other (comment) Precaution Comments:  4L O2 at baseline Restrictions Weight Bearing Restrictions: No       Mobility Bed Mobility Overal bed mobility: Needs Assistance Bed Mobility: Supine to Sit, Sit to Supine     Supine to sit: Supervision, HOB elevated Sit to supine: Mod assist, HOB elevated   General bed mobility comments: Patient requires mod A to return to bed due to BLEs, able to come into sitting with extra time and heavy use of bed rails    Transfers Overall transfer level: Needs assistance Equipment used: Rolling walker (2 wheels) Transfers: Sit to/from Stand Sit to Stand: Min guard           General transfer comment: increased momentum needed to come into standing, no physical assist needed x3 to complete sit<>stands     Balance Overall balance assessment: Needs assistance Sitting-balance support: No upper extremity supported, Feet supported Sitting balance-Leahy Scale: Fair Sitting balance - Comments: supervision   Standing balance support: Reliant on assistive device for balance, Bilateral upper extremity supported, During functional activity Standing balance-Leahy Scale: Poor Standing balance comment: Reliant on RW                           ADL either performed or assessed with clinical judgement   ADL                                         General ADL Comments: Session focus on increasing overall activity tolerance with functional ambulation    Extremity/Trunk Assessment              Vision       Perception     Praxis      Cognition Arousal/Alertness: Awake/alert Behavior During Therapy: [facility] for  tasks assessed/performed Overall Cognitive Status: Within Functional Limits for tasks assessed                                          Exercises      Shoulder Instructions       General Comments      Pertinent Vitals/ Pain       Pain Assessment Pain Assessment: No/denies pain  Home Living                                           Prior Functioning/Environment              Frequency  Min 2X/week        Progress Toward Goals  OT Goals(current goals can now be found in the care plan section)  Progress towards OT goals: Progressing toward goals  Acute Rehab OT Goals Patient Stated Goal: to get stronger and be able to walk to my own bathroom OT Goal Formulation: With patient Time For Goal Achievement: 02/28/22 Potential to Achieve Goals: [place] Discharge plan needs to be updated    Co-evaluation                 AM-PAC OT "6 Clicks" Daily Activity     Outcome Measure   Help from another person eating meals?: None Help from another person taking care of personal grooming?: A Little Help from another person toileting, which includes using toliet, bedpan, or urinal?: A Lot Help from another person bathing (including washing, rinsing, drying)?: A Lot Help from another person to put on and taking off regular upper body clothing?: A Little Help from another person to put on and taking off regular lower body clothing?: A Lot 6 Click Score: 16    End of Session Equipment Utilized During Treatment: Oxygen;Rolling walker (2 wheels);Gait belt  OT Visit Diagnosis: Unsteadiness on feet (R26.81);Other abnormalities of gait and mobility (R26.89);Muscle weakness (generalized) (M62.81)   Activity Tolerance Patient tolerated treatment well   Patient Left in bed;with call bell/phone within reach   Nurse Communication Mobility status        Time: 3149-7026 OT Time Calculation (min): 38 min  Charges: OT General Charges $OT Visit: 1 Visit OT Treatments $Self Care/Home Management : 38-52 mins  Corinne Ports E. Brayln Duque, OTR/L Acute Rehabilitation Services [phone] [place] 02/16/2022, 10:11 AM

## 2022-02-16 NOTE — TOC Progression Note (Signed)
Transition of Care [facility]) - Progression Note    Patient Details  Name: Katelyn Mahoney MRN: [id number] Date of Birth: 11-08-1934  Transition of [facility]) CM/SW Contact  Reece Agar, [place] Phone Number: 02/16/2022, 4:40 PM  Clinical Narrative:    CSW spoke with [place] at [facility] who has accepted pt but cannot take her over the weekend. CSW will follow up on Monday for DC.   Expected Discharge Plan: [place] Barriers to Discharge: Continued Medical Work up  Expected Discharge Plan and Services Expected Discharge Plan: [place] In-house Referral: Clinical Social Work Discharge Planning Services: CM Consult Post Acute Care Choice: [place] arrangements for the past 2 months: [place]                   DME Agency: NA       HH Arranged: Therapist, sports, PT, Nurse's Aide, Social Work, OT [place] Agency: [place] Date [place] Agency Contacted: 02/14/22 Time [place]: 2595 Representative spoke with at [place]: [place] (Old Agency) Interventions    Readmission Risk Interventions Readmission Risk Prevention Plan 02/14/2022 01/26/2022  Transportation Screening Complete Complete  PCP or Specialist Appt within 3-5 Days Complete Complete  HRI or [place] Complete Complete  Social Work Consult for [place] Planning/Counseling Complete Complete  Palliative Care Screening Not Applicable Not Applicable  Medication Review Press photographer) Complete Complete  Some recent data might be hidden

## 2022-02-16 NOTE — Progress Notes (Signed)
Physical Therapy Treatment Patient Details Name: Katelyn Mahoney MRN: [id number] DOB: Aug 19, 1934 Today's Date: 02/16/2022   History of Present Illness 88 y.o. female admitted 02/14/22 with SOB,  weight gain, uncontrolled A-fib with acute on chronic HFpEF exacerbation. S/p DCCV 2/14. PMhx: admit on 01/08/22 for CHF exacerbation. DM II, HTN, obesity, CKD, HLD, chronic LE edema, chronic hypoxic respiratory failure on 4L O2, Afib, HfpEF    PT Comments    Pt pleasant and reports she has to be able to walk 60' at home for room to room distance. Pt tolerating 2 gait trials today but requires seated rest between attempts. Pt with desaturation at end of gait with pt recognizing fatigue and requiring recovery before continued movement and HEP.      Recommendations for follow up therapy are one component of a multi-disciplinary discharge planning process, led by the attending physician.  Recommendations may be updated based on patient status, additional functional criteria and insurance authorization.  Follow Up Recommendations  Skilled nursing-short term rehab (<3 hours/day)     Assistance Recommended at Discharge Intermittent Supervision/Assistance  Patient can return home with the following A little help with walking and/or transfers;A little help with bathing/dressing/bathroom;Assistance with cooking/housework;Assist for transportation;Help with stairs or ramp for entrance   Equipment Recommendations  None recommended by PT    Recommendations for Other Services       Precautions / Restrictions Precautions Precautions: Fall;Other (comment) Precaution Comments: 4L O2 at baseline Restrictions Weight Bearing Restrictions: No     Mobility  Bed Mobility Overal bed mobility: Modified Independent             General bed mobility comments: increased time with use of rail    Transfers Overall transfer level: Needs assistance   Transfers: Sit to/from Stand Sit to Stand: Min guard, Min  assist           General transfer comment: cues for hand placement with min assist to complete anterior translation with rise from bed, minguard to stand from [facility] with armrests    Ambulation/Gait Ambulation/Gait assistance: Min guard Gait Distance (Feet): 35 Feet Assistive device: Rolling walker (2 wheels) Gait Pattern/deviations: Step-through pattern, Wide base of support, Decreased stride length   Gait velocity interpretation: 1.31 - 2.62 ft/sec, indicative of limited community ambulator   General Gait Details: pt able to walk 25' then 35' in room with seated rest on 6L throughout. Pt with desaturation at rest after first trial to 80% and after 2nd 86% with recovery within 30 sec seated to >90%   Stairs             Wheelchair Mobility    Modified Rankin (Stroke Patients Only)       Balance Overall balance assessment: Needs assistance   Sitting balance-Leahy Scale: Good     Standing balance support: Reliant on assistive device for balance, Bilateral upper extremity supported, During functional activity Standing balance-Leahy Scale: Poor Standing balance comment: Reliant on RW                            Cognition Arousal/Alertness: Awake/alert Behavior During Therapy: WFL for tasks assessed/performed Overall Cognitive Status: Within Functional Limits for tasks assessed                                          Exercises General Exercises - Lower Extremity Long  Arc Quad: AROM, Both, Seated, Strengthening, 15 reps Hip ABduction/ADduction: AROM, Strengthening, Both, Seated, 15 reps Hip Flexion/Marching: AROM, Both, Seated, 15 reps Toe Raises: AROM, Both, Seated, 15 reps Heel Raises: AROM, Both, Seated, 15 reps    General Comments        Pertinent Vitals/Pain Pain Assessment Pain Assessment: No/denies pain    Home Living                          Prior Function            PT Goals (current goals can now be  found in the care plan section) Progress towards PT goals: Progressing toward goals    Frequency    Min 2X/week      PT Plan Current plan remains appropriate    Co-evaluation              AM-PAC PT "6 Clicks" Mobility   Outcome Measure  Help needed turning from your back to your side while in a flat bed without using bedrails?: A Little Help needed moving from lying on your back to sitting on the side of a flat bed without using bedrails?: A Lot Help needed moving to and from a bed to a chair (including a wheelchair)?: A Little Help needed standing up from a chair using your arms (e.g., wheelchair or bedside chair)?: A Little Help needed to walk in hospital room?: A Lot Help needed climbing 3-5 steps with a railing? : Total 6 Click Score: 14    End of Session Equipment Utilized During Treatment: Oxygen Activity Tolerance: Patient tolerated treatment well Patient left: in chair;with call bell/phone within reach Nurse Communication: Mobility status PT Visit Diagnosis: Muscle weakness (generalized) (M62.81);Unsteadiness on feet (R26.81);Other abnormalities of gait and mobility (R26.89);Difficulty in walking, not elsewhere classified (R26.2)     Time: 3646-8032 PT Time Calculation (min) (ACUTE ONLY): 37 min  Charges:  $Gait Training: 8-22 mins $Therapeutic Exercise: 8-22 mins                     [place], PT Acute Rehabilitation Services Pager: [phone] Office: [place] 02/16/2022, 12:25 PM

## 2022-02-16 NOTE — Progress Notes (Signed)
Progress Note  Patient Name: Katelyn Mahoney Date of Encounter: 02/16/2022  Attending physician: Domenic Polite, MD Primary care provider: Carollee Herter, Alferd Apa, DO Primary Cardiologist: Dr. Vernell Leep  Subjective: Katelyn Mahoney is a 88 y.o. female who was seen and examined at bedside  Working with physical therapy. Denies chest pain. Shortness of breath chronic and stable. Currently on 4 L nasal cannula oxygen (same as home requirement). Case discussed and reviewed with her nurse.  Objective: Vital Signs in the last 24 hours: Temp:  [97.1 F (36.2 C)-98.3 F (36.8 C)] 97.6 F (36.4 C) (02/17 0439) Pulse Rate:  [45-79] 45 (02/17 0439) Resp:  [17-26] 23 (02/17 0439) BP: (100-127)/(36-98) 106/51 (02/17 0439) SpO2:  [95 %-100 %] 99 % (02/17 0750) Weight:  [135.2 kg] 135.2 kg (02/17 0509)  Intake/Output:  Intake/Output Summary (Last 24 hours) at 02/16/2022 0857 Last data filed at 02/16/2022 0830 Gross per 24 hour  Intake 1006.62 ml  Output 1400 ml  Net -393.38 ml    Net IO Since Admission: 99.25 mL [02/16/22 0857]  Weights:  Filed Weights   02/14/22 0331 02/15/22 0300 02/16/22 0509  Weight: 133.9 kg 134 kg 135.2 kg    Telemetry: Personally reviewed.  Normal sinus  Physical examination: PHYSICAL EXAM: Vitals with BMI 02/16/2022 02/16/2022 02/15/2022  Height - - -  Weight 298 lbs 1 oz - -  BMI 76.28 - -  Systolic - 315 176  Diastolic - 51 36  Pulse - 45 71    CONSTITUTIONAL: Obese, appears older than stated age, hemodynamically stable, no acute distress.  SKIN: Skin is warm and dry. No rash noted. No cyanosis. No pallor. No jaundice HEAD: Normocephalic and atraumatic.  Nasal cannula oxygen EYES: No scleral icterus MOUTH/THROAT: Moist oral membranes.  NECK: Unable to evaluate JVP due to short neck stature and adipose tissue, no thyromegaly noted. No carotid bruits  LYMPHATIC: No visible cervical adenopathy.  CHEST Normal respiratory effort. No  intercostal retractions  LUNGS: Decreased breath sounds bilaterally with minimal expiratory wheezing noted bilaterally at the bases.   CARDIOVASCULAR: Regular rate and rhythm, positive S1-S2, no murmurs rubs or gallops appreciated.  ABDOMINAL: Obese, soft, nontender, nondistended, positive bowel sounds in all 4 quadrants. EXTREMITIES: Ace wraps bilaterally, unable to evaluate pitting edema and distal pulses, warm to touch HEMATOLOGIC: No significant bruising NEUROLOGIC: Oriented to person, place, and time. Nonfocal. Normal muscle tone.  PSYCHIATRIC: Normal mood and affect. Normal behavior. Cooperative  Lab Results: Hematology Recent Labs  Lab 02/14/22 0355 02/15/22 0408 02/16/22 0325  WBC 10.3 10.0 11.5*  RBC 2.71* 2.68* 2.71*  HGB 7.8* 7.8* 7.8*  HCT 28.3* 26.7* 27.8*  MCV 104.4* 99.6 102.6*  MCH 28.8 29.1 28.8  MCHC 27.6* 29.2* 28.1*  RDW 16.4* 16.4* 16.1*  PLT 469* 462* 448*    Chemistry Recent Labs  Lab 02/12/22 1419 02/13/22 0039 02/14/22 0355 02/15/22 0408 02/16/22 0325  NA 139   < > 139 137 140  K 4.0   < > 3.8 3.6 3.6  CL 97*   < > 97* 96* 97*  CO2 30   < > 31 31 33*  GLUCOSE 173*   < > 188* 177* 205*  BUN 66*   < > 64* 62* 58*  CREATININE 2.66*   < > 2.45* 2.39* 2.36*  CALCIUM 9.3   < > 8.8* 8.6* 8.8*  PROT 6.5  --   --   --   --   ALBUMIN 3.1*  --   --   --   --  AST 19  --   --   --   --   ALT 16  --   --   --   --   ALKPHOS 70  --   --   --   --   BILITOT 0.8  --   --   --   --   GFRNONAA 17*   < > 19* 19* 20*  ANIONGAP 12   < > 11 10 10    < > = values in this interval not displayed.     Cardiac Enzymes: Cardiac Panel (last 3 results) No results for input(s): CKTOTAL, CKMB, TROPONINIHS, RELINDX in the last 72 hours.  BNP (last 3 results) Recent Labs    12/26/21 0117 01/08/22 1725 02/12/22 1419  BNP 343.6* 376.1* 361.8*    ProBNP (last 3 results) No results for input(s): PROBNP in the last 8760 hours.   DDimer No results for input(s):  DDIMER in the last 168 hours.   Hemoglobin A1c:  Lab Results  Component Value Date   HGBA1C 6.2 02/02/2022    TSH  Recent Labs    12/26/21 1201  TSH 0.959    Lipid Panel  Lab Results  Component Value Date   CHOL 129 02/02/2022   HDL 49.90 02/02/2022   LDLCALC 60 02/02/2022   LDLDIRECT 78.3 04/16/2013   TRIG 95.0 02/02/2022   CHOLHDL 3 02/02/2022     Imaging: No results found.  CARDIAC DATABASE: EKG 02/13/2022: Sinus rhythm w/PACs   DCCV 02/13/2022   EKG 02/08/2022: Atrial fibrillation 114 bpm Frequent PVCs  Nonspecific T-abnormality  Low voltage   Echocardiogram 01/22/2022:  1. ? left pleural effusion.   2. Left ventricular ejection fraction, by estimation, is 50 to 55%. The  left ventricle has low normal function. The left ventricle has no regional  wall motion abnormalities. Left ventricular diastolic parameters are  indeterminate.   3. Right ventricular systolic function is mildly reduced. The right  ventricular size is mildly enlarged.   4. Left atrial size was moderately dilated.   5. Right atrial size was moderately dilated.   6. The mitral valve is abnormal. Moderate mitral valve regurgitation. No  evidence of mitral stenosis.   7. Tricuspid valve regurgitation is moderate.   8. The aortic valve is tricuspid. There is moderate calcification of the  aortic valve. There is moderate thickening of the aortic valve. Aortic  valve regurgitation is trivial. Mild aortic valve stenosis.   9. The inferior vena cava is dilated in size with >50% respiratory  variability, suggesting right atrial pressure of 8 mmHg.    Echocardiogram 12/13/2021:  1. Left ventricular ejection fraction, by estimation, is 50 to 55%. The  left ventricle has low normal function. The left ventricle has no regional  wall motion abnormalities. Left ventricular diastolic parameters are  consistent with Grade II diastolic  dysfunction (pseudonormalization).   2. Right ventricular systolic  function is normal. The right ventricular  size is normal. There is moderately elevated pulmonary artery systolic  pressure. The estimated right ventricular systolic pressure is 81.1 mmHg.   3. A small pericardial effusion is present. The pericardial effusion is  circumferential.   4. The mitral valve is degenerative. Moderate mitral valve regurgitation.  No evidence of mitral stenosis.   5. The aortic valve is tricuspid. There is mild calcification of the  aortic valve. There is mild thickening of the aortic valve. Aortic valve  regurgitation is trivial. Aortic valve sclerosis/calcification is present,  without any evidence  of aortic  stenosis.   6. The inferior vena cava is dilated in size with <50% respiratory  variability, suggesting right atrial pressure of 15 mmHg.    CT Chest 01/25/2022: Small bilateral pleural effusions are noted with adjacent subsegmental atelectasis.   7 mm subpleural nodule seen in right upper lobe. Non-contrast chest CT at 6-12 months is recommended. If the nodule is stable at time of repeat CT, then future CT at 18-24 months (from today's scan) is considered optional for low-risk patients, but is recommended for high-risk patients. This recommendation follows the consensus statement: Guidelines for Management of Incidental Pulmonary Nodules Detected on CT Images: From the Fleischner Society 2017; Radiology 2017; 284:228-243.   Mild mosaic pattern is noted throughout both lungs suggesting air trapping secondary to small airways disease or potentially mild multifocal inflammation.   Mild coronary calcifications are noted.   Aortic Atherosclerosis (ICD10-I70.0).  Scheduled Meds:  apixaban  2.5 mg Oral BID   atorvastatin  10 mg Oral Q M,W,F,Su-1800   atorvastatin  20 mg Oral Q T,Th,Sat-1800   diltiazem  120 mg Oral Daily   furosemide  80 mg Intravenous Q12H   gabapentin  100 mg Oral TID   insulin aspart  0-5 Units Subcutaneous QHS   insulin aspart   0-9 Units Subcutaneous TID WC   insulin glargine-yfgn  15 Units Subcutaneous Daily   iron polysaccharides  150 mg Oral Daily   loratadine  10 mg Oral Daily   metoprolol tartrate  25 mg Oral BID   pantoprazole  40 mg Oral Daily   sertraline  100 mg Oral Daily   sodium chloride flush  3 mL Intravenous Q12H   umeclidinium bromide  1 puff Inhalation Daily    Continuous Infusions:  sodium chloride     ferric gluconate (FERRLECIT) IVPB 250 mg (02/15/22 1020)    PRN Meds: sodium chloride, acetaminophen, fluocinonide cream, levalbuterol, metoprolol tartrate, ondansetron (ZOFRAN) IV, sodium chloride, sodium chloride flush   IMPRESSION & RECOMMENDATIONS: Katelyn Mahoney is a 88 y.o. Caucasian female whose past medical history and cardiac risk factors include: Morbid obesity, hypertension, diabetes mellitus type 2, paroxysmal atrial fibrillation status post cardioversion.  Acute on chronic hypoxic respiratory failure: Multifactorial: Persistent atrial fibrillation prior to hospitalization (status post cardioversion 02/13/2022), obesity hypoventilatory syndrome, acute on chronic HFpEF, deconditioning, cardiorenal syndrome, anemia. Slowly improving. Rhythm remains normal sinus. Currently on iron supplements. Currently on IV Lasix.  Unable to uptitrate parenteral diuretics due to acute on chronic kidney disease.  Morning creatinine level appears to be plateauing. We will hold off on further up titration of IV diuretics as patient is less short of breath, requiring the same home O2 requirement, with careful monitoring of renal function. I's and O's are not accurately measured. BNP on arrival 361.  Check morning BNP  Paroxysmal atrial fibrillation: Status post cardioversion 02/13/2022. Continue Eliquis 2.5 mg p.o. twice daily. Diltiazem 120 mg p.o. daily.  Lopressor 25 mg p.o. twice daily CHA2DS2-VASc score >2. Telemetry personally reviewed overall rhythm sinus without any significant  ectopy. Check TSH  Insulin-dependent diabetes mellitus type 2: Currently managed by primary team.  As part of today's hospital visit reviewed recent labs/EKG/telemetry independently, review of prior providers notes, discussing the plan of care with primary team, medications reviewed, ordered additional diagnostic labs.  Total time spent: 35 minutes.  Patient's questions and concerns were addressed to her satisfaction. She voices understanding of the instructions provided during this encounter.   This note was created using a voice recognition  software as a result there may be grammatical errors inadvertently enclosed that do not reflect the nature of this encounter. Every attempt is made to correct such errors.  Mechele Claude [facility]  Pager: [phone] Office: [phone] 02/16/2022, 8:57 AM

## 2022-02-16 NOTE — Progress Notes (Signed)
PROGRESS NOTE    Katelyn Mahoney  CZY:[id number] DOB: 1934/09/04 DOA: 02/12/2022 PCP: Ann Held, DO  Narrative [name] with history of persistent A-fib, diastolic CHF, type 2 diabetes mellitus, obesity, was brought to the ED with worsening dyspnea and hypoxia, in the ED she was in A-fib RVR with heart rate in the 120s, chest x-ray noted CHF, has been on 4L O2 since december -Admitted, started on diuretics, underwent cardioversion on 2/14  Subjective: Feels better overall, short of breath with minimal activity  Assessment & Plan  Acute on chronic hypoxic respiratory failure  acute on chronic diastolic CHF -Multifactorial respiratory failure secondary to CHF-worsened by A-fib, obesity,? COPD (heavy 2ndary exposure to smoking-has been on 4L O2 for 1-24months -Cardioverted to sinus rhythm on 2/14 -Urine output-not accurate, purewick leaking -Remains volume overloaded, continue current dose of IV Lasix, added dose of albumin today, add a dose of albumin today, creatinine appears to be plateauing, monitor kidney function closely -Continue metoprolol -Social work consulted for short-term rehab  Atrial fibrillation with RVR -Cardioverted to sinus rhythm 2/14, continue metoprolol and diltiazem, in sinus rhythm now -Continue apixaban  CKD (chronic kidney disease), stage IV ([place])- (present on admission) -Baseline creatinine around 1.6-2 -Creatinine trended up to 2.8, now improving, creatinine down to 2.3 and plateauing -Suspect cardiorenal, continue diuretics today, BMP in a.m.  Acute on chronic anemia -Baseline hemoglobin around 8, anemia panel in January with severe iron deficiency -Hemoglobin down to 7.8 today, started IV iron -Patient denies any overt bleeding,  had an EGD and colonoscopy in January 01, 2022 which were unrevealing -Monitor  Obesity, Class III, BMI 40-49.9 (morbid obesity) ([place])- (present on admission) Calculated BMI is 50.6  Diabetes mellitus  ([place]) -Improving, continue current dose of Semglee, meal coverage  DVT prophylaxis: Apixaban Code Status: DNR Family Communication: Discussed with patient in detail, no family at bedside Disposition Plan: SNF hopefully early next week  Consultants:  Cardiology  Procedures: DC cardioversion 2/14  Antimicrobials:    Objective: Vitals:   02/16/22 0748 02/16/22 0750 02/16/22 1000 02/16/22 1138  BP:    (!) 113/52  Pulse:   74 73  Resp:    20  Temp:    97.6 F (36.4 C)  TempSrc:    Oral  SpO2: 100% 99%  98%  Weight:      Height:        Intake/Output Summary (Last 24 hours) at 02/16/2022 1422 Last data filed at 02/16/2022 1309 Gross per 24 hour  Intake 1183.62 ml  Output 1400 ml  Net -216.38 ml   Filed Weights   02/14/22 0331 02/15/22 0300 02/16/22 0509  Weight: 133.9 kg 134 kg 135.2 kg    Examination:  General exam: Obese chronically ill female sitting up in bed, AAOx3, no distress HEENT: Neck obese, positive JVD CVS: S1-S2, regular rate rhythm Lungs: Few basilar rales, otherwise clear Abdomen: Soft, nontender, bowel sounds present  Extremities: 1+ edema, Unna boots on Skin: No rashes Psychiatry: Judgement and insight appear normal. Mood & affect appropriate.     Data Reviewed:   CBC: Recent Labs  Lab 02/12/22 1419 02/14/22 0355 02/15/22 0408 02/16/22 0325  WBC 11.1* 10.3 10.0 11.5*  NEUTROABS 7.4  --   --   --   HGB 8.0* 7.8* 7.8* 7.8*  HCT 29.5* 28.3* 26.7* 27.8*  MCV 105.7* 104.4* 99.6 102.6*  PLT 507* 469* 462* 093*   Basic Metabolic Panel: Recent Labs  Lab 02/12/22 1419 02/13/22 0039 02/14/22 0355 02/15/22 0408 02/16/22  0325  NA 139 141 139 137 140  K 4.0 4.2 3.8 3.6 3.6  CL 97* 96* 97* 96* 97*  CO2 30 33* 31 31 33*  GLUCOSE 173* 222* 188* 177* 205*  BUN 66* 65* 64* 62* 58*  CREATININE 2.66* 2.88* 2.45* 2.39* 2.36*  CALCIUM 9.3 9.3 8.8* 8.6* 8.8*  MG  --   --  2.6*  --   --    GFR: Estimated Creatinine Clearance: 23.9 mL/min (A)  (by C-G formula based on SCr of 2.36 mg/dL (H)). Liver Function Tests: Recent Labs  Lab 02/12/22 1419  AST 19  ALT 16  ALKPHOS 70  BILITOT 0.8  PROT 6.5  ALBUMIN 3.1*   No results for input(s): LIPASE, AMYLASE in the last 168 hours. No results for input(s): AMMONIA in the last 168 hours. Coagulation Profile: Recent Labs  Lab 02/13/22 0039  INR 1.5*   Cardiac Enzymes: No results for input(s): CKTOTAL, CKMB, CKMBINDEX, TROPONINI in the last 168 hours. BNP (last 3 results) No results for input(s): PROBNP in the last 8760 hours. HbA1C: No results for input(s): HGBA1C in the last 72 hours. CBG: Recent Labs  Lab 02/15/22 1144 02/15/22 1703 02/15/22 2120 02/16/22 0606 02/16/22 1136  GLUCAP 284* 193* 232* 193* 256*   Lipid Profile: No results for input(s): CHOL, HDL, LDLCALC, TRIG, CHOLHDL, LDLDIRECT in the last 72 hours. Thyroid Function Tests: No results for input(s): TSH, T4TOTAL, FREET4, T3FREE, THYROIDAB in the last 72 hours. Anemia Panel: No results for input(s): VITAMINB12, FOLATE, FERRITIN, TIBC, IRON, RETICCTPCT in the last 72 hours.  Urine analysis:    Component Value Date/Time   COLORURINE YELLOW 01/21/2022 0949   APPEARANCEUR HAZY (A) 01/21/2022 0949   LABSPEC 1.008 01/21/2022 0949   PHURINE 5.0 01/21/2022 0949   GLUCOSEU NEGATIVE 01/21/2022 0949   HGBUR NEGATIVE 01/21/2022 0949   BILIRUBINUR NEGATIVE 01/21/2022 0949   BILIRUBINUR Negative 03/09/2021 0852   KETONESUR NEGATIVE 01/21/2022 0949   PROTEINUR NEGATIVE 01/21/2022 0949   UROBILINOGEN 0.2 03/09/2021 0852   UROBILINOGEN 0.2 03/10/2009 0801   NITRITE NEGATIVE 01/21/2022 0949   LEUKOCYTESUR MODERATE (A) 01/21/2022 0949   Sepsis Labs: @LABRCNTIP (procalcitonin:4,lacticidven:4)  ) Recent Results (from the past 240 hour(s))  Resp Panel by RT-PCR (Flu A&B, Covid) Nasopharyngeal Swab     Status: None   Collection Time: 02/12/22  2:27 PM   Specimen: Nasopharyngeal Swab; Nasopharyngeal(NP) swabs in  vial transport medium  Result Value Ref Range Status   SARS Coronavirus 2 by RT PCR NEGATIVE NEGATIVE Final    Comment: (NOTE) SARS-CoV-2 target nucleic acids are NOT DETECTED.  The SARS-CoV-2 RNA is generally detectable in upper respiratory specimens during the acute phase of infection. The lowest concentration of SARS-CoV-2 viral copies this assay can detect is 138 copies/mL. A negative result does not preclude SARS-Cov-2 infection and should not be used as the sole basis for treatment or other patient management decisions. A negative result may occur with  improper specimen collection/handling, submission of specimen other than nasopharyngeal swab, presence of viral mutation(s) within the areas targeted by this assay, and inadequate number of viral copies(<138 copies/mL). A negative result must be combined with clinical observations, patient history, and epidemiological information. The expected result is Negative.  Fact Sheet for Patients:  EntrepreneurPulse.com.au  Fact Sheet for Healthcare Providers:  IncredibleEmployment.be  This test is no t yet approved or cleared by the [place] FDA and  has been authorized for detection and/or diagnosis of SARS-CoV-2 by FDA under an Emergency Use  Authorization (EUA). This EUA will remain  in effect (meaning this test can be used) for the duration of the COVID-19 declaration under Section 564(b)(1) of the Act, 21 U.S.C.section 360bbb-3(b)(1), unless the authorization is terminated  or revoked sooner.       Influenza A by PCR NEGATIVE NEGATIVE Final   Influenza B by PCR NEGATIVE NEGATIVE Final    Comment: (NOTE) The Xpert Xpress SARS-CoV-2/FLU/RSV plus assay is intended as an aid in the diagnosis of influenza from Nasopharyngeal swab specimens and should not be used as a sole basis for treatment. Nasal washings and aspirates are unacceptable for Xpert Xpress SARS-CoV-2/FLU/RSV testing.  Fact  Sheet for Patients: EntrepreneurPulse.com.au  Fact Sheet for Healthcare Providers: IncredibleEmployment.be  This test is not yet approved or cleared by the [place] FDA and has been authorized for detection and/or diagnosis of SARS-CoV-2 by FDA under an Emergency Use Authorization (EUA). This EUA will remain in effect (meaning this test can be used) for the duration of the COVID-19 declaration under Section 564(b)(1) of the Act, 21 U.S.C. section 360bbb-3(b)(1), unless the authorization is terminated or revoked.  Performed at [facility], [place] [address] [place], [place] [address]      Radiology Studies: No results found.   Scheduled Meds:  apixaban  2.5 mg Oral BID   atorvastatin  10 mg Oral Q M,W,F,Su-1800   atorvastatin  20 mg Oral Q T,Th,Sat-1800   diltiazem  120 mg Oral Daily   furosemide  80 mg Intravenous Q12H   gabapentin  100 mg Oral TID   insulin aspart  0-5 Units Subcutaneous QHS   insulin aspart  0-9 Units Subcutaneous TID WC   insulin glargine-yfgn  15 Units Subcutaneous Daily   iron polysaccharides  150 mg Oral Daily   loratadine  10 mg Oral Daily   metoprolol tartrate  25 mg Oral BID   pantoprazole  40 mg Oral Daily   sertraline  100 mg Oral Daily   sodium chloride flush  3 mL Intravenous Q12H   umeclidinium bromide  1 puff Inhalation Daily   Continuous Infusions:  sodium chloride     albumin human 25 g (02/16/22 1219)   ferric gluconate (FERRLECIT) IVPB 250 mg (02/16/22 1010)     LOS: 4 days    Time spent: 66min  Domenic Polite, MD [facility]   02/16/2022, 2:22 PM

## 2022-02-16 NOTE — Progress Notes (Addendum)
Inpatient Diabetes Program Recommendations  AACE/ADA: New Consensus Statement on Inpatient Glycemic Control (2015)  Target Ranges:  Prepandial:   less than 140 mg/dL      Peak postprandial:   less than 180 mg/dL (1-2 hours)      Critically ill patients:  140 - 180 mg/dL   Lab Results  Component Value Date   GLUCAP 256 (H) 02/16/2022   HGBA1C 6.2 02/02/2022    Review of Glycemic Control  Diabetes history: DM 2 Outpatient Diabetes medications: Humalog 6 units tid with meals, Ozempic 1 mg weekly, Toujeo 34 units daily Current orders for Inpatient glycemic control:  Semglee 15 units Daily Novolog 0-9 units tid + hs  Elevated renal function  Inpatient Diabetes Program Recommendations:   Glucose trends increase after meal intake, pt takes meal coverage at home.   -  consider adding Novolog 2 units tid meal coverage if eating >50% of meals.  Thanks,  Tama Headings RN, MSN, BC-ADM Inpatient Diabetes Coordinator Team Pager [phone] (8a-5p)

## 2022-02-16 NOTE — Plan of Care (Signed)
°  Problem: Education: Goal: Ability to demonstrate management of disease process will improve Outcome: Progressing   Problem: Cardiac: Goal: Ability to achieve and maintain adequate cardiopulmonary perfusion will improve Outcome: Progressing   Problem: Clinical Measurements: Goal: Ability to maintain clinical measurements within normal limits will improve Outcome: Progressing   Problem: Clinical Measurements: Goal: Diagnostic test results will improve Outcome: Progressing   Problem: Clinical Measurements: Goal: Respiratory complications will improve Outcome: Progressing   Problem: Clinical Measurements: Goal: Cardiovascular complication will be avoided Outcome: Progressing   Problem: Pain Managment: Goal: General experience of comfort will improve Outcome: Progressing   Problem: Safety: Goal: Ability to remain free from injury will improve Outcome: Progressing   Problem: Skin Integrity: Goal: Risk for impaired skin integrity will decrease Outcome: Progressing

## 2022-02-16 NOTE — Progress Notes (Signed)
Mobility Specialist Progress Note:   02/16/22 1400  Mobility  Activity Ambulated with assistance in room  Level of Assistance Standby assist, set-up cues, supervision of patient - no hands on  Assistive Device Front wheel walker  Distance Ambulated (ft) 30 ft  Activity Response Tolerated well  $Mobility charge 1 Mobility   Pt eager for ambulation this afternoon. Required supervision for safety throughout session. Pt required 4LO2 for SpO2 >89%. Pt left in bed with bed alarm on, family member present.   Nelta Numbers Acute Rehab Phone: [phone] Office Phone: [phone]

## 2022-02-16 NOTE — Plan of Care (Signed)
  Problem: Activity: Goal: Capacity to carry out activities will improve Outcome: Progressing   Problem: Clinical Measurements: Goal: Respiratory complications will improve Outcome: Progressing   

## 2022-02-17 DIAGNOSIS — I4891 Unspecified atrial fibrillation: Secondary | ICD-10-CM

## 2022-02-17 DIAGNOSIS — I4819 Other persistent atrial fibrillation: Secondary | ICD-10-CM | POA: Diagnosis not present

## 2022-02-17 DIAGNOSIS — I1310 Hypertensive heart and chronic kidney disease without heart failure, with stage 1 through stage 4 chronic kidney disease, or unspecified chronic kidney disease: Secondary | ICD-10-CM

## 2022-02-17 DIAGNOSIS — I480 Paroxysmal atrial fibrillation: Secondary | ICD-10-CM

## 2022-02-17 DIAGNOSIS — D649 Anemia, unspecified: Secondary | ICD-10-CM

## 2022-02-17 DIAGNOSIS — I5033 Acute on chronic diastolic (congestive) heart failure: Secondary | ICD-10-CM | POA: Diagnosis not present

## 2022-02-17 LAB — URINALYSIS, ROUTINE W REFLEX MICROSCOPIC
Bilirubin Urine: NEGATIVE
Glucose, UA: NEGATIVE mg/dL
Ketones, ur: NEGATIVE mg/dL
Nitrite: NEGATIVE
Protein, ur: 30 mg/dL — AB
Specific Gravity, Urine: 1.012 (ref 1.005–1.030)
WBC, UA: >50 WBC/hpf — ABNORMAL HIGH (ref 0–5)
pH: 5 (ref 5.0–8.0)

## 2022-02-17 LAB — BASIC METABOLIC PANEL
Anion gap: 9 (ref 5–15)
BUN: 53 mg/dL — ABNORMAL HIGH (ref 8–23)
CO2: 31 mmol/L (ref 22–32)
Calcium: 8.9 mg/dL (ref 8.9–10.3)
Chloride: 98 mmol/L (ref 98–111)
Creatinine, Ser: 2.34 mg/dL — ABNORMAL HIGH (ref 0.44–1.00)
GFR, Estimated: 20 mL/min — ABNORMAL LOW (ref >60)
Glucose, Bld: 225 mg/dL — ABNORMAL HIGH (ref 70–99)
Potassium: 3.6 mmol/L (ref 3.5–5.1)
Sodium: 138 mmol/L (ref 135–145)

## 2022-02-17 LAB — GLUCOSE, CAPILLARY
Glucose-Capillary: 214 mg/dL — ABNORMAL HIGH (ref 70–99)
Glucose-Capillary: 254 mg/dL — ABNORMAL HIGH (ref 70–99)
Glucose-Capillary: 198 mg/dL — ABNORMAL HIGH (ref 70–99)
Glucose-Capillary: 147 mg/dL — ABNORMAL HIGH (ref 70–99)

## 2022-02-17 MED ORDER — FUROSEMIDE 10 MG/ML IJ SOLN
80.0000 mg | Freq: Three times a day (TID) | INTRAMUSCULAR | Status: DC
  Administered 2022-02-17 – 2022-02-24 (×21): 80 mg via INTRAVENOUS
  Filled 2022-02-17 (×21): qty 8

## 2022-02-17 MED ORDER — POTASSIUM CHLORIDE CRYS ER 20 MEQ PO TBCR
40.0000 meq | EXTENDED_RELEASE_TABLET | Freq: Once | ORAL | Status: AC
  Administered 2022-02-17: 40 meq via ORAL
  Filled 2022-02-17: qty 2

## 2022-02-17 NOTE — Progress Notes (Signed)
PROGRESS NOTE    Mckynlee Luse  NUU:[id number] DOB: [birth year] DOA: 02/12/2022 PCP: Ann Held, DO  Narrative [name] with history of persistent A-fib, diastolic CHF, type 2 diabetes mellitus, obesity, was brought to the ED with worsening dyspnea and hypoxia, in the ED she was in A-fib RVR with heart rate in the 120s, chest x-ray noted CHF, has been on 4L O2 since december -Admitted, started on diuretics, underwent cardioversion on 2/14  Subjective: Feels tired today, also reports some dysuria  Assessment & Plan  Acute on chronic hypoxic respiratory failure  acute on chronic diastolic CHF -Multifactorial respiratory failure secondary to CHF-worsened by A-fib, obesity,? COPD (heavy 2ndary exposure to smoking-has been on 4L O2 for 1-48months -Cardioverted to sinus rhythm on 2/14 -Urine output-not accurate, weight unfortunately trending up -Remains volume overloaded, continue IV Lasix will increase dose to 80 Mg 3 times daily for today, kidney function now stable, recheck albumin tomorrow -Some component of her edema is likely from venous insufficiency -Continue metoprolol -Social work consulted for short-term rehab  Atrial fibrillation with RVR -Cardioverted to sinus rhythm 2/14, continue metoprolol and diltiazem, in sinus rhythm now -Continue apixaban  CKD (chronic kidney disease), stage IV ([place])- (present on admission) -Baseline creatinine around 1.6-2 -Creatinine trended up to 2.8, now improving, creatinine down to 2.3 and plateauing -Suspect cardiorenal, continue diuretics as above, BMP in a.m.  Acute on chronic anemia -Baseline hemoglobin around 8, anemia panel in January with severe iron deficiency -Hemoglobin down to 7.8 and stable, given 4 doses of IV iron -Patient denies any overt bleeding,  had an EGD and colonoscopy in January 01, 2022 which were unrevealing -CBC in a.m.  Dysuria -Check urinalysis, urine culture  Obesity, Class III, BMI 40-49.9 (morbid  obesity) ([place])- (present on admission) Calculated BMI is 50.6  Diabetes mellitus ([facility]) -Improving, increase Semglee dose, continue meal coverage  DVT prophylaxis: Apixaban Code Status: DNR Family Communication: Discussed with patient in detail, no family at bedside Disposition Plan: SNF hopefully early next week  Consultants:  Cardiology  Procedures: DC cardioversion 2/14  Antimicrobials:    Objective: Vitals:   02/17/22 0519 02/17/22 0751 02/17/22 0815 02/17/22 1108  BP: (!) 108/43  114/66 (!) 110/50  Pulse: 70 72 71 72  Resp: (!) 21 16  (!) 24  Temp: 98.5 F (36.9 C)   97.8 F (36.6 C)  TempSrc: Axillary   Oral  SpO2: 95% 95% 92% 97%  Weight:      Height:        Intake/Output Summary (Last 24 hours) at 02/17/2022 1328 Last data filed at 02/17/2022 1100 Gross per 24 hour  Intake 1428.25 ml  Output 1000 ml  Net 428.25 ml   Filed Weights   02/15/22 0300 02/16/22 0509 02/17/22 0500  Weight: 134 kg 135.2 kg (!) 136.4 kg    Examination:  General exam: Obese chronically ill female sitting up in bed, AAOx3, no distress HEENT: Neck obese positive JVD CVS: S1-S2, regular rate rhythm Lungs: Few basilar rales, otherwise clear Abdomen: Soft, nontender, bowel sounds present Extremities: 1+ edema, extending to lateral thigh , Unna boots on  skin: No rashes Psychiatry: Judgement and insight appear normal. Mood & affect appropriate.     Data Reviewed:   CBC: Recent Labs  Lab 02/12/22 1419 02/14/22 0355 02/15/22 0408 02/16/22 0325  WBC 11.1* 10.3 10.0 11.5*  NEUTROABS 7.4  --   --   --   HGB 8.0* 7.8* 7.8* 7.8*  HCT 29.5* 28.3* 26.7* 27.8*  MCV 105.7* 104.4* 99.6 102.6*  PLT 507* 469* 462* 153*   Basic Metabolic Panel: Recent Labs  Lab 02/13/22 0039 02/14/22 0355 02/15/22 0408 02/16/22 0325 02/17/22 0417  NA 141 139 137 140 138  K 4.2 3.8 3.6 3.6 3.6  CL 96* 97* 96* 97* 98  CO2 33* 31 31 33* 31  GLUCOSE 222* 188* 177* 205* 225*  BUN 65* 64* 62*  58* 53*  CREATININE 2.88* 2.45* 2.39* 2.36* 2.34*  CALCIUM 9.3 8.8* 8.6* 8.8* 8.9  MG  --  2.6*  --   --   --    GFR: Estimated Creatinine Clearance: 24.3 mL/min (A) (by C-G formula based on SCr of 2.34 mg/dL (H)). Liver Function Tests: Recent Labs  Lab 02/12/22 1419  AST 19  ALT 16  ALKPHOS 70  BILITOT 0.8  PROT 6.5  ALBUMIN 3.1*   No results for input(s): LIPASE, AMYLASE in the last 168 hours. No results for input(s): AMMONIA in the last 168 hours. Coagulation Profile: Recent Labs  Lab 02/13/22 0039  INR 1.5*   Cardiac Enzymes: No results for input(s): CKTOTAL, CKMB, CKMBINDEX, TROPONINI in the last 168 hours. BNP (last 3 results) No results for input(s): PROBNP in the last 8760 hours. HbA1C: No results for input(s): HGBA1C in the last 72 hours. CBG: Recent Labs  Lab 02/16/22 1136 02/16/22 1703 02/16/22 2030 02/17/22 0557 02/17/22 1106  GLUCAP 256* 179* 195* 214* 254*   Lipid Profile: No results for input(s): CHOL, HDL, LDLCALC, TRIG, CHOLHDL, LDLDIRECT in the last 72 hours. Thyroid Function Tests: No results for input(s): TSH, T4TOTAL, FREET4, T3FREE, THYROIDAB in the last 72 hours. Anemia Panel: No results for input(s): VITAMINB12, FOLATE, FERRITIN, TIBC, IRON, RETICCTPCT in the last 72 hours.  Urine analysis:    Component Value Date/Time   COLORURINE YELLOW 01/21/2022 0949   APPEARANCEUR HAZY (A) 01/21/2022 0949   LABSPEC 1.008 01/21/2022 0949   PHURINE 5.0 01/21/2022 0949   GLUCOSEU NEGATIVE 01/21/2022 0949   HGBUR NEGATIVE 01/21/2022 0949   BILIRUBINUR NEGATIVE 01/21/2022 0949   BILIRUBINUR Negative 03/09/2021 0852   KETONESUR NEGATIVE 01/21/2022 0949   PROTEINUR NEGATIVE 01/21/2022 0949   UROBILINOGEN 0.2 03/09/2021 0852   UROBILINOGEN 0.2 03/10/2009 0801   NITRITE NEGATIVE 01/21/2022 0949   LEUKOCYTESUR MODERATE (A) 01/21/2022 0949   Sepsis Labs: @LABRCNTIP (procalcitonin:4,lacticidven:4)  ) Recent Results (from the past 240 hour(s))   Resp Panel by RT-PCR (Flu A&B, Covid) Nasopharyngeal Swab     Status: None   Collection Time: 02/12/22  2:27 PM   Specimen: Nasopharyngeal Swab; Nasopharyngeal(NP) swabs in vial transport medium  Result Value Ref Range Status   SARS Coronavirus 2 by RT PCR NEGATIVE NEGATIVE Final    Comment: (NOTE) SARS-CoV-2 target nucleic acids are NOT DETECTED.  The SARS-CoV-2 RNA is generally detectable in upper respiratory specimens during the acute phase of infection. The lowest concentration of SARS-CoV-2 viral copies this assay can detect is 138 copies/mL. A negative result does not preclude SARS-Cov-2 infection and should not be used as the sole basis for treatment or other patient management decisions. A negative result may occur with  improper specimen collection/handling, submission of specimen other than nasopharyngeal swab, presence of viral mutation(s) within the areas targeted by this assay, and inadequate number of viral copies(<138 copies/mL). A negative result must be combined with clinical observations, patient history, and epidemiological information. The expected result is Negative.  Fact Sheet for Patients:  EntrepreneurPulse.com.au  Fact Sheet for Healthcare Providers:  IncredibleEmployment.be  This test  is no t yet approved or cleared by the [place] and  has been authorized for detection and/or diagnosis of SARS-CoV-2 by FDA under an Emergency Use Authorization (EUA). This EUA will remain  in effect (meaning this test can be used) for the duration of the COVID-19 declaration under Section 564(b)(1) of the Act, 21 U.S.C.section 360bbb-3(b)(1), unless the authorization is terminated  or revoked sooner.       Influenza A by PCR NEGATIVE NEGATIVE Final   Influenza B by PCR NEGATIVE NEGATIVE Final    Comment: (NOTE) The Xpert Xpress SARS-CoV-2/FLU/RSV plus assay is intended as an aid in the diagnosis of influenza from  Nasopharyngeal swab specimens and should not be used as a sole basis for treatment. Nasal washings and aspirates are unacceptable for Xpert Xpress SARS-CoV-2/FLU/RSV testing.  Fact Sheet for Patients: EntrepreneurPulse.com.au  Fact Sheet for Healthcare Providers: IncredibleEmployment.be  This test is not yet approved or cleared by the [place] FDA and has been authorized for detection and/or diagnosis of SARS-CoV-2 by FDA under an Emergency Use Authorization (EUA). This EUA will remain in effect (meaning this test can be used) for the duration of the COVID-19 declaration under Section 564(b)(1) of the Act, 21 U.S.C. section 360bbb-3(b)(1), unless the authorization is terminated or revoked.  Performed at [facility], [place] [address] [place], [place] [address]      Radiology Studies: No results found.   Scheduled Meds:  apixaban  2.5 mg Oral BID   atorvastatin  10 mg Oral Q M,W,F,Su-1800   atorvastatin  20 mg Oral Q T,Th,Sat-1800   diltiazem  120 mg Oral Daily   furosemide  80 mg Intravenous Q8H   gabapentin  100 mg Oral TID   insulin aspart  0-5 Units Subcutaneous QHS   insulin aspart  0-9 Units Subcutaneous TID WC   insulin glargine-yfgn  15 Units Subcutaneous Daily   iron polysaccharides  150 mg Oral Daily   loratadine  10 mg Oral Daily   metoprolol tartrate  25 mg Oral BID   pantoprazole  40 mg Oral Daily   sertraline  100 mg Oral Daily   sodium chloride flush  3 mL Intravenous Q12H   umeclidinium bromide  1 puff Inhalation Daily   Continuous Infusions:  sodium chloride       LOS: 5 days    Time spent: 25min  Domenic Polite, MD [facility]   02/17/2022, 1:28 PM

## 2022-02-18 DIAGNOSIS — I5033 Acute on chronic diastolic (congestive) heart failure: Secondary | ICD-10-CM | POA: Diagnosis not present

## 2022-02-18 LAB — BASIC METABOLIC PANEL
Anion gap: 9 (ref 5–15)
Anion gap: 11 (ref 5–15)
BUN: 55 mg/dL — ABNORMAL HIGH (ref 8–23)
BUN: 55 mg/dL — ABNORMAL HIGH (ref 8–23)
CO2: 30 mmol/L (ref 22–32)
CO2: 28 mmol/L (ref 22–32)
Calcium: 8.9 mg/dL (ref 8.9–10.3)
Calcium: 9 mg/dL (ref 8.9–10.3)
Chloride: 98 mmol/L (ref 98–111)
Chloride: 98 mmol/L (ref 98–111)
Creatinine, Ser: 2.34 mg/dL — ABNORMAL HIGH (ref 0.44–1.00)
Creatinine, Ser: 2.33 mg/dL — ABNORMAL HIGH (ref 0.44–1.00)
GFR, Estimated: 20 mL/min — ABNORMAL LOW (ref >60)
GFR, Estimated: 20 mL/min — ABNORMAL LOW (ref >60)
Glucose, Bld: 161 mg/dL — ABNORMAL HIGH (ref 70–99)
Glucose, Bld: 245 mg/dL — ABNORMAL HIGH (ref 70–99)
Potassium: 3.7 mmol/L (ref 3.5–5.1)
Potassium: 3.5 mmol/L (ref 3.5–5.1)
Sodium: 137 mmol/L (ref 135–145)
Sodium: 137 mmol/L (ref 135–145)

## 2022-02-18 LAB — CBC
HCT: 27.3 % — ABNORMAL LOW (ref 36.0–46.0)
Hemoglobin: 7.9 g/dL — ABNORMAL LOW (ref 12.0–15.0)
MCH: 29.4 pg (ref 26.0–34.0)
MCHC: 28.9 g/dL — ABNORMAL LOW (ref 30.0–36.0)
MCV: 101.5 fL — ABNORMAL HIGH (ref 80.0–100.0)
Platelets: 422 10*3/uL — ABNORMAL HIGH (ref 150–400)
RBC: 2.69 MIL/uL — ABNORMAL LOW (ref 3.87–5.11)
RDW: 16.4 % — ABNORMAL HIGH (ref 11.5–15.5)
WBC: 14 10*3/uL — ABNORMAL HIGH (ref 4.0–10.5)
nRBC: 1.6 % — ABNORMAL HIGH (ref 0.0–0.2)

## 2022-02-18 LAB — GLUCOSE, CAPILLARY
Glucose-Capillary: 183 mg/dL — ABNORMAL HIGH (ref 70–99)
Glucose-Capillary: 240 mg/dL — ABNORMAL HIGH (ref 70–99)
Glucose-Capillary: 224 mg/dL — ABNORMAL HIGH (ref 70–99)
Glucose-Capillary: 212 mg/dL — ABNORMAL HIGH (ref 70–99)

## 2022-02-18 MED ORDER — INSULIN GLARGINE-YFGN 100 UNIT/ML ~~LOC~~ SOLN
18.0000 [IU] | Freq: Every day | SUBCUTANEOUS | Status: DC
  Administered 2022-02-19: 18 [IU] via SUBCUTANEOUS
  Filled 2022-02-18: qty 0.18

## 2022-02-18 MED ORDER — SODIUM CHLORIDE 0.9 % IV SOLN
1.0000 g | INTRAVENOUS | Status: DC
  Administered 2022-02-18 – 2022-02-19 (×2): 1 g via INTRAVENOUS
  Filled 2022-02-18 (×2): qty 10

## 2022-02-18 MED ORDER — GABAPENTIN 100 MG PO CAPS
100.0000 mg | ORAL_CAPSULE | Freq: Two times a day (BID) | ORAL | Status: DC
  Administered 2022-02-18 – 2022-03-02 (×24): 100 mg via ORAL
  Filled 2022-02-18 (×23): qty 1

## 2022-02-18 MED ORDER — LOPERAMIDE HCL 2 MG PO CAPS
2.0000 mg | ORAL_CAPSULE | Freq: Once | ORAL | Status: AC
  Administered 2022-02-18: 2 mg via ORAL
  Filled 2022-02-18: qty 1

## 2022-02-18 NOTE — Progress Notes (Addendum)
PROGRESS NOTE    Katelyn Mahoney  XTK:[id number] DOB: 01/16/1934 DOA: 02/12/2022 PCP: Ann Held, DO  Narrative [name] with history of persistent A-fib, chronic diastolic CHF, right ventricular failure, type 2 diabetes mellitus, CKD 4, obesity, was brought to the ED with worsening dyspnea and hypoxia, in the ED she was in A-fib RVR with heart rate in the 120s, chest x-ray noted CHF, has been on 4L O2 since december -Admitted, started on diuretics, underwent cardioversion on 2/14 -Has been on high-dose diuretics, with suboptimal response-has CKD 4 -Now with UTI, started antibiotics 2/19 -Plan for SNF soon  Subjective: Was tired yesterday, felt slept most of the day, some dysuria, pelvic discomfort reported, breathing stable  Assessment & Plan  Acute on chronic hypoxic respiratory failure  acute on chronic diastolic CHF -Multifactorial respiratory failure secondary to CHF-worsened by A-fib, obesity,? COPD (heavy 2ndary exposure to smoking-has been on 4L O2 for 1-80months -Cardioverted to sinus rhythm on 2/14 -Urine output-not accurate, weight unfortunately trending up -Has responded poorly to diuretics, increased Lasix dose to 80 Mg 3 times daily yesterday, kidney function now stable, recheck albumin tomorrow -Some component of her edema is likely from venous insufficiency -Continue metoprolol -Social work consulted for short-term rehab  Atrial fibrillation with RVR -Cardioverted to sinus rhythm 2/14, continue metoprolol and diltiazem, in sinus rhythm now -Continue apixaban  CKD (chronic kidney disease), stage IV ([place])- (present on admission) -Baseline creatinine around 1.6-2 -Creatinine trended up to 2.8, now improving, creatinine down to 2.3 and stable -Suspect cardiorenal, continue diuretics as above, BMP in a.m.  Acute on chronic anemia -Baseline hemoglobin around 8, anemia panel in January with severe iron deficiency -Hemoglobin down to 7.8 and stable, given 4 doses of  IV iron -Patient denies any overt bleeding,  had an EGD and colonoscopy in January 01, 2022 which were unrevealing -CBC in a.m.  UTI -Follow-up urine culture, started ceftriaxone  Obesity, Class III, BMI 40-49.9 (morbid obesity) ([place])- (present on admission) Calculated BMI is 50.6  Diabetes mellitus ([place]) -Improving, will increase Semglee dose, continue meal coverage  DVT prophylaxis: Apixaban Code Status: DNR Family Communication: Discussed with patient in detail, no family at bedside Disposition Plan: SNF hopefully in 48 hours  Consultants:  Cardiology  Procedures: DC cardioversion 2/14  Antimicrobials:    Objective: Vitals:   02/17/22 2032 02/18/22 0304 02/18/22 0836 02/18/22 0922  BP: (!) 110/43 (!) 104/51  (!) 119/58  Pulse: 64 63  76  Resp: 18 20    Temp: 97.8 F (36.6 C) (!) 97.4 F (36.3 C)    TempSrc: Oral Oral    SpO2: 98% 96% 96%   Weight:  (!) 136.8 kg    Height:        Intake/Output Summary (Last 24 hours) at 02/18/2022 1223 Last data filed at 02/18/2022 1135 Gross per 24 hour  Intake 1106.15 ml  Output 275 ml  Net 831.15 ml   Filed Weights   02/16/22 0509 02/17/22 0500 02/18/22 0304  Weight: 135.2 kg (!) 136.4 kg (!) 136.8 kg    Examination:  General exam: Obese chronically ill female sitting up in bed, AAOx3, no distress HEENT: Neck obese positive JVD CVS: S1-S2, regular rate rhythm Lungs: Few basilar rales, otherwise clear Abdomen: Soft, nontender, bowel sounds present Extremities: 1+ edema, extending to lateral thigh , Unna boots on  skin: No rashes Psychiatry: Judgement and insight appear normal. Mood & affect appropriate.     Data Reviewed:   CBC: Recent Labs  Lab 02/12/22  1419 02/14/22 0355 02/15/22 0408 02/16/22 0325 02/18/22 0501  WBC 11.1* 10.3 10.0 11.5* 14.0*  NEUTROABS 7.4  --   --   --   --   HGB 8.0* 7.8* 7.8* 7.8* 7.9*  HCT 29.5* 28.3* 26.7* 27.8* 27.3*  MCV 105.7* 104.4* 99.6 102.6* 101.5*  PLT 507* 469* 462*  448* 496*   Basic Metabolic Panel: Recent Labs  Lab 02/14/22 0355 02/15/22 0408 02/16/22 0325 02/17/22 0417 02/18/22 0501  NA 139 137 140 138 137  K 3.8 3.6 3.6 3.6 3.7  CL 97* 96* 97* 98 98  CO2 31 31 33* 31 30  GLUCOSE 188* 177* 205* 225* 161*  BUN 64* 62* 58* 53* 55*  CREATININE 2.45* 2.39* 2.36* 2.34* 2.34*  CALCIUM 8.8* 8.6* 8.8* 8.9 8.9  MG 2.6*  --   --   --   --    GFR: Estimated Creatinine Clearance: 24.3 mL/min (A) (by C-G formula based on SCr of 2.34 mg/dL (H)). Liver Function Tests: Recent Labs  Lab 02/12/22 1419  AST 19  ALT 16  ALKPHOS 70  BILITOT 0.8  PROT 6.5  ALBUMIN 3.1*   No results for input(s): LIPASE, AMYLASE in the last 168 hours. No results for input(s): AMMONIA in the last 168 hours. Coagulation Profile: Recent Labs  Lab 02/13/22 0039  INR 1.5*   Cardiac Enzymes: No results for input(s): CKTOTAL, CKMB, CKMBINDEX, TROPONINI in the last 168 hours. BNP (last 3 results) No results for input(s): PROBNP in the last 8760 hours. HbA1C: No results for input(s): HGBA1C in the last 72 hours. CBG: Recent Labs  Lab 02/17/22 1106 02/17/22 1621 02/17/22 2047 02/18/22 0619 02/18/22 1144  GLUCAP 254* 198* 147* 183* 240*   Lipid Profile: No results for input(s): CHOL, HDL, LDLCALC, TRIG, CHOLHDL, LDLDIRECT in the last 72 hours. Thyroid Function Tests: No results for input(s): TSH, T4TOTAL, FREET4, T3FREE, THYROIDAB in the last 72 hours. Anemia Panel: No results for input(s): VITAMINB12, FOLATE, FERRITIN, TIBC, IRON, RETICCTPCT in the last 72 hours.  Urine analysis:    Component Value Date/Time   COLORURINE YELLOW 02/17/2022 1824   APPEARANCEUR HAZY (A) 02/17/2022 1824   LABSPEC 1.012 02/17/2022 1824   PHURINE 5.0 02/17/2022 1824   GLUCOSEU NEGATIVE 02/17/2022 1824   HGBUR SMALL (A) 02/17/2022 1824   BILIRUBINUR NEGATIVE 02/17/2022 1824   BILIRUBINUR Negative 03/09/2021 0852   KETONESUR NEGATIVE 02/17/2022 1824   PROTEINUR 30 (A)  02/17/2022 1824   UROBILINOGEN 0.2 03/09/2021 0852   UROBILINOGEN 0.2 03/10/2009 0801   NITRITE NEGATIVE 02/17/2022 1824   LEUKOCYTESUR LARGE (A) 02/17/2022 1824   Sepsis Labs: @LABRCNTIP (procalcitonin:4,lacticidven:4)  ) Recent Results (from the past 240 hour(s))  Resp Panel by RT-PCR (Flu A&B, Covid) Nasopharyngeal Swab     Status: None   Collection Time: 02/12/22  2:27 PM   Specimen: Nasopharyngeal Swab; Nasopharyngeal(NP) swabs in vial transport medium  Result Value Ref Range Status   SARS Coronavirus 2 by RT PCR NEGATIVE NEGATIVE Final    Comment: (NOTE) SARS-CoV-2 target nucleic acids are NOT DETECTED.  The SARS-CoV-2 RNA is generally detectable in upper respiratory specimens during the acute phase of infection. The lowest concentration of SARS-CoV-2 viral copies this assay can detect is 138 copies/mL. A negative result does not preclude SARS-Cov-2 infection and should not be used as the sole basis for treatment or other patient management decisions. A negative result may occur with  improper specimen collection/handling, submission of specimen other than nasopharyngeal swab, presence of viral mutation(s) within  the areas targeted by this assay, and inadequate number of viral copies(<138 copies/mL). A negative result must be combined with clinical observations, patient history, and epidemiological information. The expected result is Negative.  Fact Sheet for Patients:  EntrepreneurPulse.com.au  Fact Sheet for Healthcare Providers:  IncredibleEmployment.be  This test is no t yet approved or cleared by the [place] FDA and  has been authorized for detection and/or diagnosis of SARS-CoV-2 by FDA under an Emergency Use Authorization (EUA). This EUA will remain  in effect (meaning this test can be used) for the duration of the COVID-19 declaration under Section 564(b)(1) of the Act, 21 U.S.C.section 360bbb-3(b)(1), unless the  authorization is terminated  or revoked sooner.       Influenza A by PCR NEGATIVE NEGATIVE Final   Influenza B by PCR NEGATIVE NEGATIVE Final    Comment: (NOTE) The Xpert Xpress SARS-CoV-2/FLU/RSV plus assay is intended as an aid in the diagnosis of influenza from Nasopharyngeal swab specimens and should not be used as a sole basis for treatment. Nasal washings and aspirates are unacceptable for Xpert Xpress SARS-CoV-2/FLU/RSV testing.  Fact Sheet for Patients: EntrepreneurPulse.com.au  Fact Sheet for Healthcare Providers: IncredibleEmployment.be  This test is not yet approved or cleared by the [place] FDA and has been authorized for detection and/or diagnosis of SARS-CoV-2 by FDA under an Emergency Use Authorization (EUA). This EUA will remain in effect (meaning this test can be used) for the duration of the COVID-19 declaration under Section 564(b)(1) of the Act, 21 U.S.C. section 360bbb-3(b)(1), unless the authorization is terminated or revoked.  Performed at [facility], [facility] [address], [place], [place] [address]      Radiology Studies: No results found.   Scheduled Meds:  apixaban  2.5 mg Oral BID   atorvastatin  10 mg Oral Q M,W,F,Su-1800   atorvastatin  20 mg Oral Q T,Th,Sat-1800   diltiazem  120 mg Oral Daily   furosemide  80 mg Intravenous Q8H   gabapentin  100 mg Oral BID   insulin aspart  0-5 Units Subcutaneous QHS   insulin aspart  0-9 Units Subcutaneous TID WC   insulin glargine-yfgn  15 Units Subcutaneous Daily   iron polysaccharides  150 mg Oral Daily   loratadine  10 mg Oral Daily   metoprolol tartrate  25 mg Oral BID   pantoprazole  40 mg Oral Daily   sertraline  100 mg Oral Daily   sodium chloride flush  3 mL Intravenous Q12H   umeclidinium bromide  1 puff Inhalation Daily   Continuous Infusions:  sodium chloride     cefTRIAXone (ROCEPHIN)  IV 1 g (02/18/22 0921)     LOS: 6 days    Time  spent: 80min  Domenic Polite, MD [facility]   02/18/2022, 12:23 PM

## 2022-02-19 ENCOUNTER — Encounter (HOSPITAL_COMMUNITY)
Admission: EM | Disposition: A | Discharge status: Hospice | Payer: Self-pay | Source: Home / Self Care | Attending: Internal Medicine

## 2022-02-19 ENCOUNTER — Other Ambulatory Visit: Payer: Medicare Other

## 2022-02-19 DIAGNOSIS — I5033 Acute on chronic diastolic (congestive) heart failure: Secondary | ICD-10-CM | POA: Diagnosis not present

## 2022-02-19 DIAGNOSIS — I4891 Unspecified atrial fibrillation: Secondary | ICD-10-CM | POA: Diagnosis not present

## 2022-02-19 DIAGNOSIS — I130 Hypertensive heart and chronic kidney disease with heart failure and stage 1 through stage 4 chronic kidney disease, or unspecified chronic kidney disease: Principal | ICD-10-CM

## 2022-02-19 DIAGNOSIS — N184 Chronic kidney disease, stage 4 (severe): Secondary | ICD-10-CM | POA: Diagnosis not present

## 2022-02-19 DIAGNOSIS — I4819 Other persistent atrial fibrillation: Secondary | ICD-10-CM | POA: Diagnosis not present

## 2022-02-19 DIAGNOSIS — E1121 Type 2 diabetes mellitus with diabetic nephropathy: Secondary | ICD-10-CM | POA: Diagnosis not present

## 2022-02-19 LAB — BASIC METABOLIC PANEL
Anion gap: 12 (ref 5–15)
BUN: 55 mg/dL — ABNORMAL HIGH (ref 8–23)
CO2: 28 mmol/L (ref 22–32)
Calcium: 8.9 mg/dL (ref 8.9–10.3)
Chloride: 98 mmol/L (ref 98–111)
Creatinine, Ser: 2.43 mg/dL — ABNORMAL HIGH (ref 0.44–1.00)
GFR, Estimated: 19 mL/min — ABNORMAL LOW (ref >60)
Glucose, Bld: 171 mg/dL — ABNORMAL HIGH (ref 70–99)
Potassium: 4.3 mmol/L (ref 3.5–5.1)
Sodium: 138 mmol/L (ref 135–145)

## 2022-02-19 LAB — CBC
HCT: 26.5 % — ABNORMAL LOW (ref 36.0–46.0)
Hemoglobin: 7.5 g/dL — ABNORMAL LOW (ref 12.0–15.0)
MCH: 28.8 pg (ref 26.0–34.0)
MCHC: 28.3 g/dL — ABNORMAL LOW (ref 30.0–36.0)
MCV: 101.9 fL — ABNORMAL HIGH (ref 80.0–100.0)
Platelets: 400 10*3/uL (ref 150–400)
RBC: 2.6 MIL/uL — ABNORMAL LOW (ref 3.87–5.11)
RDW: 17 % — ABNORMAL HIGH (ref 11.5–15.5)
WBC: 12.3 10*3/uL — ABNORMAL HIGH (ref 4.0–10.5)
nRBC: 1.5 % — ABNORMAL HIGH (ref 0.0–0.2)

## 2022-02-19 LAB — ALBUMIN: Albumin: 3.1 g/dL — ABNORMAL LOW (ref 3.5–5.0)

## 2022-02-19 LAB — GLUCOSE, CAPILLARY
Glucose-Capillary: 162 mg/dL — ABNORMAL HIGH (ref 70–99)
Glucose-Capillary: 255 mg/dL — ABNORMAL HIGH (ref 70–99)
Glucose-Capillary: 251 mg/dL — ABNORMAL HIGH (ref 70–99)
Glucose-Capillary: 218 mg/dL — ABNORMAL HIGH (ref 70–99)

## 2022-02-19 SURGERY — RIGHT HEART CATH
Anesthesia: LOCAL

## 2022-02-19 MED ORDER — CEPHALEXIN 250 MG PO CAPS
500.0000 mg | ORAL_CAPSULE | Freq: Two times a day (BID) | ORAL | Status: DC
  Administered 2022-02-19 – 2022-02-20 (×2): 500 mg via ORAL
  Filled 2022-02-19 (×2): qty 2

## 2022-02-19 MED ORDER — INSULIN GLARGINE-YFGN 100 UNIT/ML ~~LOC~~ SOLN
20.0000 [IU] | Freq: Every day | SUBCUTANEOUS | Status: DC
  Administered 2022-02-20 – 2022-03-01 (×10): 20 [IU] via SUBCUTANEOUS
  Filled 2022-02-19 (×11): qty 0.2

## 2022-02-19 NOTE — Plan of Care (Signed)
°  Problem: Education: Goal: Ability to demonstrate management of disease process will improve 02/19/2022 1859 by Serafina Mitchell, RN Outcome: Progressing 02/19/2022 1859 by Serafina Mitchell, RN Outcome: Progressing Goal: Ability to verbalize understanding of medication therapies will improve 02/19/2022 1859 by Serafina Mitchell, RN Outcome: Progressing 02/19/2022 1859 by Serafina Mitchell, RN Outcome: Progressing Goal: Individualized Educational Video(s) 02/19/2022 1859 by Serafina Mitchell, RN Outcome: Progressing 02/19/2022 1859 by Serafina Mitchell, RN Outcome: Progressing   Problem: Activity: Goal: Capacity to carry out activities will improve 02/19/2022 1859 by Serafina Mitchell, RN Outcome: Progressing 02/19/2022 1859 by Serafina Mitchell, RN Outcome: Progressing   Problem: Cardiac: Goal: Ability to achieve and maintain adequate cardiopulmonary perfusion will improve 02/19/2022 1859 by Serafina Mitchell, RN Outcome: Progressing 02/19/2022 1859 by Serafina Mitchell, RN Outcome: Progressing   Problem: Education: Goal: Knowledge of General Education information will improve Description: Including pain rating scale, medication(s)/side effects and non-pharmacologic comfort measures 02/19/2022 1859 by Serafina Mitchell, RN Outcome: Progressing 02/19/2022 1859 by Serafina Mitchell, RN Outcome: Progressing   Problem: Health Behavior/Discharge Planning: Goal: Ability to manage health-related needs will improve 02/19/2022 1859 by Serafina Mitchell, RN Outcome: Progressing 02/19/2022 1859 by Serafina Mitchell, RN Outcome: Progressing   Problem: Clinical Measurements: Goal: Ability to maintain clinical measurements within normal limits will improve 02/19/2022 1859 by Serafina Mitchell, RN Outcome: Progressing 02/19/2022 1859 by Serafina Mitchell, RN Outcome: Progressing Goal: Will remain free from infection 02/19/2022 1859 by Serafina Mitchell,  RN Outcome: Progressing 02/19/2022 1859 by Serafina Mitchell, RN Outcome: Progressing Goal: Diagnostic test results will improve 02/19/2022 1859 by Serafina Mitchell, RN Outcome: Progressing 02/19/2022 1859 by Serafina Mitchell, RN Outcome: Progressing Goal: Respiratory complications will improve 02/19/2022 1859 by Serafina Mitchell, RN Outcome: Progressing 02/19/2022 1859 by Serafina Mitchell, RN Outcome: Progressing Goal: Cardiovascular complication will be avoided 02/19/2022 1859 by Serafina Mitchell, RN Outcome: Progressing 02/19/2022 1859 by Serafina Mitchell, RN Outcome: Progressing   Problem: Activity: Goal: Risk for activity intolerance will decrease 02/19/2022 1859 by Serafina Mitchell, RN Outcome: Progressing 02/19/2022 1859 by Serafina Mitchell, RN Outcome: Progressing   Problem: Nutrition: Goal: Adequate nutrition will be maintained 02/19/2022 1859 by Serafina Mitchell, RN Outcome: Progressing 02/19/2022 1859 by Lora Paula D, RN Outcome: Progressing   Problem: Coping: Goal: Level of anxiety will decrease 02/19/2022 1859 by Serafina Mitchell, RN Outcome: Progressing 02/19/2022 1859 by Serafina Mitchell, RN Outcome: Progressing   Problem: Elimination: Goal: Will not experience complications related to bowel motility 02/19/2022 1859 by Serafina Mitchell, RN Outcome: Progressing 02/19/2022 1859 by Serafina Mitchell, RN Outcome: Progressing Goal: Will not experience complications related to urinary retention 02/19/2022 1859 by Serafina Mitchell, RN Outcome: Progressing 02/19/2022 1859 by Serafina Mitchell, RN Outcome: Progressing   Problem: Pain Managment: Goal: General experience of comfort will improve 02/19/2022 1859 by Serafina Mitchell, RN Outcome: Progressing 02/19/2022 1859 by Serafina Mitchell, RN Outcome: Progressing   Problem: Safety: Goal: Ability to remain free from injury will improve 02/19/2022 1859 by Serafina Mitchell, RN Outcome: Progressing 02/19/2022 1859 by Serafina Mitchell, RN Outcome: Progressing   Problem: Skin Integrity: Goal: Risk for impaired skin integrity will decrease 02/19/2022 1859 by Serafina Mitchell, RN Outcome: Progressing 02/19/2022 1859 by Serafina Mitchell, RN Outcome: Progressing

## 2022-02-19 NOTE — Consult Note (Signed)
Consultation Note Date: 02/19/2022   Patient Name: Katelyn Mahoney  DOB: 1934-06-06  MRN: [id number]  Age / Sex: 88 y.o., female  PCP: Ann Held, DO Referring Physician: Domenic Polite, MD  Reason for Consultation: Establishing goals of care  HPI/Patient Profile: 88 y.o. female  with past medical history of persistent A-fib, chronic diastolic CHF, right ventricular failure, insulin-dependent type 2 diabetes mellitus, CKD 4, morbid obesity-BMI 81, chronic respiratory failure admitted on 02/12/2022 with worsening dyspnea and hypoxia, as well as worsening leg swelling and ulcers over the left ankle.  In the ED, patient was in A-fib RVR with heart rate in the 120s, chest x-ray noted CHF. S/p cardioversion on 2/14 with overall poor prognosis. Today Cr is slightly increased from 2.33 -> 2.43 and glucose remains elevated. Palliative medicine has been consulted to assist with goals of care conversation.  Clinical Assessment and Goals of Care:  I have reviewed medical records including EPIC notes, labs and imaging, assessed the patient and then met at the bedside to discuss diagnosis prognosis, GOC, EOL wishes, disposition and options.  I introduced Palliative Medicine as specialized medical care for people living with serious illness. It focuses on providing relief from the symptoms and stress of a serious illness. The goal is to improve quality of life for both the patient and the family.  Medical History Review and Understanding: Katelyn Mahoney has a good understanding of her overall illness, stating her body is "worn out" and describing years of ongoing decline that began with back pain. She would not be surprised if her condition were to continue deteriorating and she is at peace with her own mortality. However, she is also willing to continue life-prolonging efforts with medical management and rehab for  possible improvement. She states "I'm ready to go on and I've talked to the [place]. But maybe there's a reason for me to stick around too." She describes a good outcome as a peaceful death.  Social History: Katelyn Mahoney is widowed (her husband died of bladder cancer) and she currently lives at home with her daughter and son-in-law. She previously worked with an IT consultant and enjoyed playing golf until her back pain started. She has many friends who check on her daily and support her, often more than family.   Functional and Nutritional State: Her daughter cooks for her, as she is no longer able to physically tolerate most ADLs. She is exhausted and can only walk/stand for a few minutes at a time due to fatigue. She is unable to do chores or housework.    Palliative Symptoms: Dyspnea with exertion, fatigue   Advance Directives: A detailed discussion regarding advanced directives was had today. Katelyn Mahoney is ambivalent about whether her daughter understands her wishes and expectations for her future.  Code Status: Concepts specific to code status, artifical feeding and hydration, and rehospitalization were considered and discussed. She confirms she is a DNR.  Discussion: Katelyn Mahoney reports that her life has been a "long, rough one." She has reflected on the  impact of her illnesses on her quality of life - it has been worsening. She states that she would prefer to "have a heart attack and die" but also does not feel ready to transition to a comfort-focused approach or hospice at this time. She is somewhat familiar with hospice through a friend that was enrolled. She notes that she is glad her husband did not die from his COPD, as the thought of suffocating to death is scary. She is willing to do what it takes to get better and is interested in discussing her options further if she does not improve with rehab.    The difference between aggressive medical intervention and comfort care was  considered in light of the patient's goals of care. Hospice and Palliative Care services outpatient were explained and offered.   Discussed the importance of continued conversation with family and the medical providers regarding overall plan of care and treatment options, ensuring decisions are within the context of the patients values and GOCs.   Questions and concerns were addressed.  Hard Choices booklet left for review. The patient was encouraged to call with questions or concerns.  PMT will continue to support holistically.   PATIENT is the primary decision maker. Next of kin is her daughter. No HCPOA on file.    SUMMARY OF RECOMMENDATIONS   -DNR confirmed -Patient is willing to try medical management/SNF but open to discussing hospice if she declines further; she understands this is very likely and at peace with her prognosis  -MOST form was introduced today -TOC assistance appreciated with outpatient palliative care referral -Ongoing support from PMT  Prognosis:  Poor prognosis given acute exacerbation of multiple chronic conditions, functional decline, and advanced age Discharge Planning: [place] for rehab with Palliative care service follow-up      Primary Diagnoses: Present on Admission:  Primary hypertension  Atrial fibrillation with RVR ([place])  Obesity, Class III, BMI 40-49.9 (morbid obesity) ([place])  Pulmonary nodule   I have reviewed the medical record, interviewed the patient and family, and examined the patient. The following aspects are pertinent.  Past Medical History:  Diagnosis Date   Allergy    Anxiety    Arthritis    Arthritis    Callus    Depression    Diabetes mellitus without complication ([place])    Hyperlipidemia    Hypertension    Social History   Socioeconomic History   Marital status: Widowed    Spouse name: Not on file   Number of children: 1   Years of education: 67   Highest education level: 12th grade  Occupational  History   Not on file  Tobacco Use   Smoking status: Never    Passive exposure: Never   Smokeless tobacco: Never  Vaping Use   Vaping Use: Never used  Substance and Sexual Activity   Alcohol use: No    Alcohol/week: 0.0 standard drinks   Drug use: No   Sexual activity: Never    Birth control/protection: Abstinence  Other Topics Concern   Not on file  Social History Narrative   Not on file   Social Determinants of Health   Financial Resource Strain: Low Risk    Difficulty of Paying Living Expenses: Not very hard  Food Insecurity: No Food Insecurity   Worried About Charity fundraiser in the Last Year: Never true   Ran Out of Food in the Last Year: Never true  Transportation Needs: No Transportation Needs   Lack of  Transportation (Medical): No   Lack of Transportation (Non-Medical): No  Physical Activity: Inactive   Days of Exercise per Week: 0 days   Minutes of Exercise per Session: 0 min  Stress: No Stress Concern Present   Feeling of Stress : Only a little  Social Connections: Moderately Isolated   Frequency of Communication with Friends and Family: More than three times a week   Frequency of Social Gatherings with Friends and Family: More than three times a week   Attends Religious Services: 1 to 4 times per year   Active Member of [employer] or Organizations: No   Attends Archivist Meetings: Never   Marital Status: Widowed   Family History  Problem Relation Age of Onset   Cancer Mother    Diabetes Mother    Cancer Father    Diabetes Father    Cancer Sister    Diabetes Sister    Cancer Brother    Brain cancer Brother    Diabetes Brother    Diabetes Maternal Grandmother    Diabetes Paternal Grandmother    Cancer - Lung Brother    Diabetes Brother    Scheduled Meds:  apixaban  2.5 mg Oral BID   atorvastatin  10 mg Oral Q M,W,F,Su-1800   atorvastatin  20 mg Oral Q T,Th,Sat-1800   cephALEXin  500 mg Oral Q12H   diltiazem  120 mg Oral Daily    furosemide  80 mg Intravenous Q8H   gabapentin  100 mg Oral BID   insulin aspart  0-5 Units Subcutaneous QHS   insulin aspart  0-9 Units Subcutaneous TID WC   [START ON 02/20/2022] insulin glargine-yfgn  20 Units Subcutaneous Daily   iron polysaccharides  150 mg Oral Daily   loratadine  10 mg Oral Daily   metoprolol tartrate  25 mg Oral BID   pantoprazole  40 mg Oral Daily   sertraline  100 mg Oral Daily   sodium chloride flush  3 mL Intravenous Q12H   umeclidinium bromide  1 puff Inhalation Daily   Continuous Infusions:  sodium chloride     PRN Meds:.sodium chloride, acetaminophen, fluocinonide cream, levalbuterol, metoprolol tartrate, ondansetron (ZOFRAN) IV, sodium chloride, sodium chloride flush Medications Prior to Admission:  Prior to Admission medications   Medication Sig Start Date End Date Taking? Authorizing Provider  acetaminophen (TYLENOL) 650 MG CR tablet 650 mg every 8 (eight) hours.   Yes [provider]  albuterol (VENTOLIN HFA) 108 (90 Base) MCG/ACT inhaler Inhale 2 puffs into the lungs every 6 (six) hours as needed for wheezing or shortness of breath. 01/26/22  Yes Marianna Payment, MD  apixaban (ELIQUIS) 2.5 MG TABS tablet Take 1 tablet (2.5 mg total) by mouth 2 (two) times daily. 02/08/22 03/10/22 Yes Patwardhan, Manish J, MD  atorvastatin (LIPITOR) 20 MG tablet TAKE 1/2 TABLET ON MONDAY, WEDNESDAY, [place]      SUNDAYS Patient taking differently: Take 20 mg by mouth 4 (four) times a week. Take 1/2 (10 mg) tablet on Monday, Wednesday, Friday, and Sunday 02/12/22  Yes Lowne Chase, Kendrick Fries R, DO  Bilberry, Vaccinium myrtillus, (BILBERRY PO) Take 1 tablet by mouth daily.   Yes [provider]  diltiazem (CARDIZEM CD) 120 MG 24 hr capsule Take 1 capsule (120 mg total) by mouth daily. 02/08/22 03/10/22 Yes Patwardhan, Manish J, MD  fluocinonide cream (LIDEX) 9.79 % Apply 1 application topically daily as needed (breakouts).   Yes [provider]  gabapentin  (NEURONTIN) 100 MG capsule TAKE 1  CAPSULE BY MOUTH 3 TIMES DAILY Patient taking differently: Take 100 mg by mouth 3 (three) times daily. 01/29/22  Yes Elayne Snare, MD  Glucosamine HCl (GLUCOSAMINE PO) Take 1 tablet by mouth daily.   Yes [provider]  insulin lispro (HUMALOG KWIKPEN) 100 UNIT/ML KwikPen Inject 6 Units into the skin with breakfast, with lunch, and with evening meal. Inject 7-9 units under the skin three times daily before meals. E11.65 12/27/21  Yes Demaio, Alexa, MD  ipratropium-albuterol (DUONEB) 0.5-2.5 (3) MG/3ML SOLN Take 3 mLs by nebulization every 6 (six) hours as needed. 01/26/22 04/26/22 Yes Marianna Payment, MD  iron polysaccharides (NU-IRON) 150 MG capsule Take 1 capsule (150 mg total) by mouth daily. 01/26/22 04/26/22 Yes Marianna Payment, MD  Loratadine 10 MG CAPS Take 10 mg by mouth daily.   Yes [provider]  metoprolol tartrate (LOPRESSOR) 25 MG tablet Take 1 tablet (25 mg total) by mouth 2 (two) times daily. 01/26/22 04/26/22 Yes Marianna Payment, MD  multivitamin-lutein [facility]) CAPS capsule Take 1 capsule by mouth daily. 04/07/14  Yes Midge Minium, MD  OZEMPIC, 1 MG/DOSE, 4 MG/3ML SOPN INJECT 1MG SUBCUTANEOUSLY  WEEKLY (EVERY 7 DAYS) Patient taking differently: Inject 1 mg into the skin every Saturday. 01/05/22  Yes Elayne Snare, MD  pantoprazole (PROTONIX) 40 MG tablet Take 1 tablet (40 mg total) by mouth daily. 02/08/22 03/10/22 Yes Roma Schanz R, DO  potassium chloride SA (KLOR-CON M) 20 MEQ tablet Take 2 tablets (40 mEq total) by mouth daily. 01/26/22 02/25/22 Yes Marianna Payment, MD  sertraline (ZOLOFT) 100 MG tablet TAKE 1 TABLET DAILY Patient taking differently: Take 100 mg by mouth daily. 09/11/21  Yes Roma Schanz R, DO  sodium chloride (OCEAN) 0.65 % SOLN nasal spray Place 1 spray into both nostrils as needed for congestion.   Yes [provider]  Tiotropium Bromide Monohydrate (SPIRIVA RESPIMAT) 2.5 MCG/ACT AERS Inhale 1  puff into the lungs daily. 01/26/22 04/26/22 Yes Marianna Payment, MD  torsemide 60 MG TABS Take 60 mg by mouth 2 (two) times daily. 01/26/22 04/26/22 Yes Marianna Payment, MD  TOUJEO SOLOSTAR 300 UNIT/ML Solostar Pen INJECT 34 UNITS            SUBCUTANEOUSLY DAILY Patient taking differently: Inject 34 Units into the skin daily. 10/09/21  Yes Elayne Snare, MD  glipiZIDE (GLUCOTROL XL) 10 MG 24 hr tablet TAKE 1 TABLET EVERY MORNING Patient not taking: Reported on 02/12/2022 02/12/22   Elayne Snare, MD  Insulin Pen Needle 32G X 4 MM MISC Use on insulin pens 4 times a day 08/10/21   Elayne Snare, MD  NONFORMULARY OR COMPOUNDED ITEM Lift chair  #1   Dx osteoarthritis knees 03/09/21   Carollee Herter, Alferd Apa, DO  OneTouch Delica Lancets 05W MISC Use to test blood sugar once daily- E11.65 05/10/21   Elayne Snare, MD  [facility] VERIO test strip USE TO TEST BLOOD SUGAR    TWICE A DAY 11/20/21   Elayne Snare, MD   Allergies  Allergen Reactions   Bupropion Hcl Rash    Palmar rash   Penicillins Rash    rash   Citalopram Hydrobromide Other (See Comments)    cough   Metformin And Related Diarrhea   Blue Dyes (Parenteral) Rash   Cobalt Rash   Gold-Containing Drug Products Rash   Nickel Rash   Zinc Rash   Review of Systems  Constitutional:  Positive for fatigue.  Respiratory:  Positive for shortness of breath.   All  other systems reviewed and are negative.  Physical Exam Vitals and nursing note reviewed.  Constitutional:      General: She is not in acute distress.    Appearance: She is obese. She is ill-appearing.     Interventions: Nasal cannula in place.  Cardiovascular:     Rate and Rhythm: Normal rate and regular rhythm.  Pulmonary:     Effort: Tachypnea present. No respiratory distress.  Skin:    General: Skin is warm and dry.  Neurological:     Mental Status: She is alert and oriented to person, place, and time.  Psychiatric:        Behavior: Behavior is cooperative.    Vital Signs: BP (!) 122/53  (BP Location: Right Arm)    Pulse 73    Temp 98 F (36.7 C) (Oral)    Resp (!) 26    Ht _0  (1.626 m)    Wt (!) 136.7 kg    LMP  (LMP Unknown)    SpO2 95%    BMI 51.73 kg/m  Pain Scale: 0-10   Pain Score: 0-No pain   SpO2: SpO2: 95 % O2 Device:SpO2: 95 % O2 Flow Rate: .O2 Flow Rate (L/min): 3 L/min  IO: Intake/output summary:  Intake/Output Summary (Last 24 hours) at 02/19/2022 1317 Last data filed at 02/19/2022 1243 Gross per 24 hour  Intake 605 ml  Output 150 ml  Net 455 ml    LBM: Last BM Date : 02/17/22 Baseline Weight: Weight: 131.1 kg Most recent weight: Weight: (!) 136.7 kg     Palliative Assessment/Data: 40% PPS   Total time: 75 minutes  MDM: High Medical Decision Making:4 #/Complex Problems:4                      Data Reviewed:4                 Management:4  Dorthy Cooler, PA-C Palliative Medicine Team Team phone # [phone]  Thank you for allowing the Palliative Medicine Team to assist in the care of this patient. Please utilize secure chat with additional questions, if there is no response within 30 minutes please call the above phone number.  Palliative Medicine Team providers are available by phone from 7am to 7pm daily and can be reached through the team cell phone.  Should this patient require assistance outside of these hours, please call the patient's attending physician.

## 2022-02-19 NOTE — Care Management Important Message (Signed)
Important Message  Patient Details  Name: Katelyn Mahoney MRN: [id number] Date of Birth: [birth year]   Medicare Important Message Given:  Yes     Shelda Altes 02/19/2022, 8:32 AM

## 2022-02-19 NOTE — Progress Notes (Signed)
Mobility Specialist Progress Note:   02/19/22 1130  Mobility  Activity Dangled on edge of bed  Level of Assistance Minimal assist, patient does 75% or more  Assistive Device None  Activity Response Tolerated poorly  $Mobility charge 1 Mobility   Pt agreeable to mobility session this am. Breathing obviously more labored today, pt agrees. Pt desat to 84% upon sitting EOB, also c/o dizziness (BP 122/53). Further mobility declined by pt at this time. Will f/u for hopeful ambulation this afternoon. Pt left in bed with all needs met.   Nelta Numbers [facility] Phone: [phone] Office Phone: [phone]

## 2022-02-19 NOTE — Progress Notes (Signed)
Mobility Specialist Progress Note:   02/19/22 1500  Mobility  Activity Dangled on edge of bed  Range of Motion/Exercises Active;Right leg;Left leg  Level of Assistance Minimal assist, patient does 75% or more  Assistive Device None  Activity Response Tolerated fair  $Mobility charge 1 Mobility   Pt agreeable to another mobility session today. Able to get EOB with minA, pt desat to 77% on 3LO2. Increased to 6LO2 to maintain >89%. Pt performed multiple BLE exercises, further mobility deferred. Pt back in bed with all needs met.  Nelta Numbers Acute Rehab Phone: [phone] Office Phone: [phone]

## 2022-02-19 NOTE — Progress Notes (Signed)
PROGRESS NOTE    Katelyn Mahoney  GEZ:[id number] DOB: [birth year] DOA: 02/12/2022 PCP: Ann Held, DO  Narrative [name] with history of persistent A-fib, chronic diastolic CHF, right ventricular failure, type 2 diabetes mellitus, CKD 4, morbid obesity-BMI 36, chronic respiratory failure (suspected COPD, long history of secondhand exposure -on 4 L home O2 for 2 to 3 months ) was brought to the ED with worsening dyspnea and hypoxia, in the ED she was in A-fib RVR with heart rate in the 120s, chest x-ray noted CHF -Admitted, started on diuretics, underwent cardioversion on 2/14 -Has been on high-dose diuretics, with suboptimal response-has CKD 4 -Now with UTI, started antibiotics 2/19 -Plan for SNF soon  Subjective: Was a bit tired yesterday, feels okay overall, dyspnea is stable, urinary symptoms improved  Assessment & Plan  Acute on chronic hypoxic respiratory failure  acute on chronic diastolic CHF -Multifactorial respiratory failure secondary to CHF-worsened by A-fib, obesity,? COPD (heavy 2ndary exposure to smoking-has been on 4L O2 for 2+months -Cardioverted to sinus rhythm on 2/14 -Output remains inaccurate, weight continues to trend up -Has responded poorly to diuretics, increased Lasix dose to 80 Mg 3 times daily yesterday, kidney function is stable, albumin is 3.1 -Some component of her edema is likely from venous insufficiency, continue UNNA boots -Continue metoprolol -Cardiology discussed right heart cath with patient, she wishes to avoid this, overall prognosis is poor in the setting of chronic respiratory failure, CHF, CKD 4, morbid obesity, debility, poor response to diuretics etc. Will request palliative care consulted, called and updated niece  -Social work consulted for short-term rehab  Atrial fibrillation with RVR -Cardioverted to sinus rhythm 2/14, continue metoprolol and diltiazem, in sinus rhythm now -Continue apixaban  CKD (chronic kidney disease), stage IV  ([place])- (present on admission) -Baseline creatinine around 1.6-2 -Creatinine trended up to 2.8, then trended down to 2.3-2.4 and stable -Suspect cardiorenal, continue diuretics as above, BMP in a.m.  Acute on chronic anemia -Baseline hemoglobin around 8, anemia panel in January with severe iron deficiency -Hemoglobin down to 7.8 and stable, given 4 doses of IV iron -Patient denies any overt bleeding,  had an EGD and colonoscopy in January 01, 2022 which were unrevealing -CBC in a.m.  UTI -Urine culture with pansensitive E. coli, day 2 of ceftriaxone now will transition to oral Keflex  Obesity, Class III, BMI 40-49.9 (morbid obesity) ([place])- (present on admission) Calculated BMI is 50.6  Diabetes mellitus ([place]) -Improving, will increase Semglee dose, continue meal coverage  DVT prophylaxis: Apixaban Code Status: DNR Family Communication: No family at bedside, was unable to reach daughter, called and updated niece June bullock Disposition Plan: SNF hopefully in 74 hours  Consultants:  Cardiology  Procedures: DC cardioversion 2/14  Antimicrobials:    Objective: Vitals:   02/18/22 2028 02/19/22 0315 02/19/22 1007 02/19/22 1122  BP: (!) 106/37 (!) 108/40 (!) 112/50 (!) 122/53  Pulse: 68 70 73 73  Resp: 20 20 18  (!) 26  Temp: 97.7 F (36.5 C) 98 F (36.7 C) 97.7 F (36.5 C) 98 F (36.7 C)  TempSrc: Oral Oral Oral Oral  SpO2: 96% 93% 90% 95%  Weight:  (!) 136.7 kg    Height:        Intake/Output Summary (Last 24 hours) at 02/19/2022 1224 Last data filed at 02/19/2022 0800 Gross per 24 hour  Intake 545 ml  Output 150 ml  Net 395 ml   Filed Weights   02/17/22 0500 02/18/22 0304 02/19/22 0315  Weight: (!) 136.4  kg (!) 136.8 kg (!) 136.7 kg    Examination:  General exam: Obese chronically ill female sitting up in bed, AAO x2, no distress HEENT: Neck obese, positive JVD CVS: S1-S2, regular rhythm Lungs: Few basilar rales, otherwise clear Abdomen: Soft, nontender,  bowel sounds present Extremities: 1+ edema, extending to lateral thigh, Unna boots on skin: No rashes Psychiatry: Judgement and insight appear normal. Mood & affect appropriate.     Data Reviewed:   CBC: Recent Labs  Lab 02/12/22 1419 02/14/22 0355 02/15/22 0408 02/16/22 0325 02/18/22 0501 02/19/22 0329  WBC 11.1* 10.3 10.0 11.5* 14.0* 12.3*  NEUTROABS 7.4  --   --   --   --   --   HGB 8.0* 7.8* 7.8* 7.8* 7.9* 7.5*  HCT 29.5* 28.3* 26.7* 27.8* 27.3* 26.5*  MCV 105.7* 104.4* 99.6 102.6* 101.5* 101.9*  PLT 507* 469* 462* 448* 422* 604   Basic Metabolic Panel: Recent Labs  Lab 02/14/22 0355 02/15/22 0408 02/16/22 0325 02/17/22 0417 02/18/22 0501 02/18/22 1151 02/19/22 0329  NA 139   < > 140 138 137 137 138  K 3.8   < > 3.6 3.6 3.7 3.5 4.3  CL 97*   < > 97* 98 98 98 98  CO2 31   < > 33* 31 30 28 28   GLUCOSE 188*   < > 205* 225* 161* 245* 171*  BUN 64*   < > 58* 53* 55* 55* 55*  CREATININE 2.45*   < > 2.36* 2.34* 2.34* 2.33* 2.43*  CALCIUM 8.8*   < > 8.8* 8.9 8.9 9.0 8.9  MG 2.6*  --   --   --   --   --   --    < > = values in this interval not displayed.   GFR: Estimated Creatinine Clearance: 23.4 mL/min (A) (by C-G formula based on SCr of 2.43 mg/dL (H)). Liver Function Tests: Recent Labs  Lab 02/12/22 1419 02/19/22 0326  AST 19  --   ALT 16  --   ALKPHOS 70  --   BILITOT 0.8  --   PROT 6.5  --   ALBUMIN 3.1* 3.1*   No results for input(s): LIPASE, AMYLASE in the last 168 hours. No results for input(s): AMMONIA in the last 168 hours. Coagulation Profile: Recent Labs  Lab 02/13/22 0039  INR 1.5*   Cardiac Enzymes: No results for input(s): CKTOTAL, CKMB, CKMBINDEX, TROPONINI in the last 168 hours. BNP (last 3 results) No results for input(s): PROBNP in the last 8760 hours. HbA1C: No results for input(s): HGBA1C in the last 72 hours. CBG: Recent Labs  Lab 02/18/22 1144 02/18/22 1458 02/18/22 2115 02/19/22 0606 02/19/22 1120  GLUCAP 240* 224*  212* 162* 255*   Lipid Profile: No results for input(s): CHOL, HDL, LDLCALC, TRIG, CHOLHDL, LDLDIRECT in the last 72 hours. Thyroid Function Tests: No results for input(s): TSH, T4TOTAL, FREET4, T3FREE, THYROIDAB in the last 72 hours. Anemia Panel: No results for input(s): VITAMINB12, FOLATE, FERRITIN, TIBC, IRON, RETICCTPCT in the last 72 hours.  Urine analysis:    Component Value Date/Time   COLORURINE YELLOW 02/17/2022 1824   APPEARANCEUR HAZY (A) 02/17/2022 1824   LABSPEC 1.012 02/17/2022 1824   PHURINE 5.0 02/17/2022 1824   GLUCOSEU NEGATIVE 02/17/2022 1824   HGBUR SMALL (A) 02/17/2022 1824   BILIRUBINUR NEGATIVE 02/17/2022 1824   BILIRUBINUR Negative 03/09/2021 [place] 02/17/2022 1824   PROTEINUR 30 (A) 02/17/2022 1824   UROBILINOGEN 0.2 03/09/2021 5409  UROBILINOGEN 0.2 03/10/2009 0801   NITRITE NEGATIVE 02/17/2022 1824   LEUKOCYTESUR LARGE (A) 02/17/2022 1824   Sepsis Labs: @LABRCNTIP (procalcitonin:4,lacticidven:4)  ) Recent Results (from the past 240 hour(s))  Resp Panel by RT-PCR (Flu A&B, Covid) Nasopharyngeal Swab     Status: None   Collection Time: 02/12/22  2:27 PM   Specimen: Nasopharyngeal Swab; Nasopharyngeal(NP) swabs in vial transport medium  Result Value Ref Range Status   SARS Coronavirus 2 by RT PCR NEGATIVE NEGATIVE Final    Comment: (NOTE) SARS-CoV-2 target nucleic acids are NOT DETECTED.  The SARS-CoV-2 RNA is generally detectable in upper respiratory specimens during the acute phase of infection. The lowest concentration of SARS-CoV-2 viral copies this assay can detect is 138 copies/mL. A negative result does not preclude SARS-Cov-2 infection and should not be used as the sole basis for treatment or other patient management decisions. A negative result may occur with  improper specimen collection/handling, submission of specimen other than nasopharyngeal swab, presence of viral mutation(s) within the areas targeted by this  assay, and inadequate number of viral copies(<138 copies/mL). A negative result must be combined with clinical observations, patient history, and epidemiological information. The expected result is Negative.  Fact Sheet for Patients:  EntrepreneurPulse.com.au  Fact Sheet for Healthcare Providers:  IncredibleEmployment.be  This test is no t yet approved or cleared by the [place] FDA and  has been authorized for detection and/or diagnosis of SARS-CoV-2 by FDA under an Emergency Use Authorization (EUA). This EUA will remain  in effect (meaning this test can be used) for the duration of the COVID-19 declaration under Section 564(b)(1) of the Act, 21 U.S.C.section 360bbb-3(b)(1), unless the authorization is terminated  or revoked sooner.       Influenza A by PCR NEGATIVE NEGATIVE Final   Influenza B by PCR NEGATIVE NEGATIVE Final    Comment: (NOTE) The Xpert Xpress SARS-CoV-2/FLU/RSV plus assay is intended as an aid in the diagnosis of influenza from Nasopharyngeal swab specimens and should not be used as a sole basis for treatment. Nasal washings and aspirates are unacceptable for Xpert Xpress SARS-CoV-2/FLU/RSV testing.  Fact Sheet for Patients: EntrepreneurPulse.com.au  Fact Sheet for Healthcare Providers: IncredibleEmployment.be  This test is not yet approved or cleared by the [place] FDA and has been authorized for detection and/or diagnosis of SARS-CoV-2 by FDA under an Emergency Use Authorization (EUA). This EUA will remain in effect (meaning this test can be used) for the duration of the COVID-19 declaration under Section 564(b)(1) of the Act, 21 U.S.C. section 360bbb-3(b)(1), unless the authorization is terminated or revoked.  Performed at [facility], [place] [address], [place], [place] [address]   Urine Culture     Status: Abnormal (Preliminary result)   Collection Time:  02/17/22 10:00 AM   Specimen: Urine, Clean Catch  Result Value Ref Range Status   Specimen Description URINE, CLEAN CATCH  Final   Special Requests NONE  Final   Culture (A)  Final    >=100,000 COLONIES/mL ESCHERICHIA COLI 60,000 COLONIES/mL GRAM NEGATIVE RODS IDENTIFICATION AND SUSCEPTIBILITIES TO FOLLOW Performed at [facility], [address], [place], [place] [address]    Report Status PENDING  Incomplete   Organism ID, Bacteria ESCHERICHIA COLI (A)  Final      Susceptibility   Escherichia coli - MIC*    AMPICILLIN 8 SENSITIVE Sensitive     CEFAZOLIN <=4 SENSITIVE Sensitive     CEFEPIME <=0.12 SENSITIVE Sensitive     CEFTRIAXONE <=0.25 SENSITIVE  Sensitive     CIPROFLOXACIN <=0.25 SENSITIVE Sensitive     GENTAMICIN <=1 SENSITIVE Sensitive     IMIPENEM <=0.25 SENSITIVE Sensitive     NITROFURANTOIN <=16 SENSITIVE Sensitive     TRIMETH/SULFA <=20 SENSITIVE Sensitive     AMPICILLIN/SULBACTAM 4 SENSITIVE Sensitive     PIP/TAZO <=4 SENSITIVE Sensitive     * >=100,000 COLONIES/mL ESCHERICHIA COLI     Radiology Studies: No results found.   Scheduled Meds:  apixaban  2.5 mg Oral BID   atorvastatin  10 mg Oral Q M,W,F,Su-1800   atorvastatin  20 mg Oral Q T,Th,Sat-1800   diltiazem  120 mg Oral Daily   furosemide  80 mg Intravenous Q8H   gabapentin  100 mg Oral BID   insulin aspart  0-5 Units Subcutaneous QHS   insulin aspart  0-9 Units Subcutaneous TID WC   insulin glargine-yfgn  18 Units Subcutaneous Daily   iron polysaccharides  150 mg Oral Daily   loratadine  10 mg Oral Daily   metoprolol tartrate  25 mg Oral BID   pantoprazole  40 mg Oral Daily   sertraline  100 mg Oral Daily   sodium chloride flush  3 mL Intravenous Q12H   umeclidinium bromide  1 puff Inhalation Daily   Continuous Infusions:  sodium chloride     cefTRIAXone (ROCEPHIN)  IV 1 g (02/19/22 1017)     LOS: 7 days    Time spent: 5min  Domenic Polite, MD [facility]   02/19/2022,  12:24 PM

## 2022-02-20 ENCOUNTER — Inpatient Hospital Stay: Payer: Medicare Other | Admitting: Primary Care

## 2022-02-20 ENCOUNTER — Inpatient Hospital Stay (HOSPITAL_COMMUNITY): Payer: Medicare Other

## 2022-02-20 DIAGNOSIS — I4891 Unspecified atrial fibrillation: Secondary | ICD-10-CM | POA: Diagnosis not present

## 2022-02-20 DIAGNOSIS — Z7189 Other specified counseling: Secondary | ICD-10-CM | POA: Diagnosis not present

## 2022-02-20 DIAGNOSIS — I10 Essential (primary) hypertension: Secondary | ICD-10-CM

## 2022-02-20 DIAGNOSIS — N184 Chronic kidney disease, stage 4 (severe): Secondary | ICD-10-CM | POA: Diagnosis not present

## 2022-02-20 DIAGNOSIS — I5033 Acute on chronic diastolic (congestive) heart failure: Secondary | ICD-10-CM | POA: Diagnosis not present

## 2022-02-20 LAB — CBC
HCT: 27.1 % — ABNORMAL LOW (ref 36.0–46.0)
Hemoglobin: 7.7 g/dL — ABNORMAL LOW (ref 12.0–15.0)
MCH: 29.1 pg (ref 26.0–34.0)
MCHC: 28.4 g/dL — ABNORMAL LOW (ref 30.0–36.0)
MCV: 102.3 fL — ABNORMAL HIGH (ref 80.0–100.0)
Platelets: 396 10*3/uL (ref 150–400)
RBC: 2.65 MIL/uL — ABNORMAL LOW (ref 3.87–5.11)
RDW: 17.4 % — ABNORMAL HIGH (ref 11.5–15.5)
WBC: 11 10*3/uL — ABNORMAL HIGH (ref 4.0–10.5)
nRBC: 2.2 % — ABNORMAL HIGH (ref 0.0–0.2)

## 2022-02-20 LAB — URINE CULTURE: Culture: >=100000 — AB

## 2022-02-20 LAB — GLUCOSE, CAPILLARY
Glucose-Capillary: 156 mg/dL — ABNORMAL HIGH (ref 70–99)
Glucose-Capillary: 271 mg/dL — ABNORMAL HIGH (ref 70–99)
Glucose-Capillary: 212 mg/dL — ABNORMAL HIGH (ref 70–99)
Glucose-Capillary: 190 mg/dL — ABNORMAL HIGH (ref 70–99)

## 2022-02-20 LAB — BASIC METABOLIC PANEL
Anion gap: 8 (ref 5–15)
BUN: 52 mg/dL — ABNORMAL HIGH (ref 8–23)
CO2: 33 mmol/L — ABNORMAL HIGH (ref 22–32)
Calcium: 9.1 mg/dL (ref 8.9–10.3)
Chloride: 99 mmol/L (ref 98–111)
Creatinine, Ser: 2.43 mg/dL — ABNORMAL HIGH (ref 0.44–1.00)
GFR, Estimated: 19 mL/min — ABNORMAL LOW (ref >60)
Glucose, Bld: 162 mg/dL — ABNORMAL HIGH (ref 70–99)
Potassium: 3.5 mmol/L (ref 3.5–5.1)
Sodium: 140 mmol/L (ref 135–145)

## 2022-02-20 MED ORDER — GUAIFENESIN ER 600 MG PO TB12: 600.0000 mg | ORAL_TABLET | Freq: Two times a day (BID) | ORAL | Status: DC | PRN

## 2022-02-20 MED ORDER — METOLAZONE 5 MG PO TABS
5.0000 mg | ORAL_TABLET | Freq: Once | ORAL | Status: AC
  Administered 2022-02-20: 5 mg via ORAL
  Filled 2022-02-20: qty 1

## 2022-02-20 MED ORDER — LEVOFLOXACIN 750 MG PO TABS
750.0000 mg | ORAL_TABLET | ORAL | Status: AC
  Administered 2022-02-20: 750 mg via ORAL
  Filled 2022-02-20 (×3): qty 1

## 2022-02-20 NOTE — Progress Notes (Signed)
Late entry: I met with the patient on 02/19/2022 and discussed difficult clinical situation with chronic respiratory failure, acute on chronic HFpEF, cardiorenal syndrome, deconditioning. I offered her RHC to better define her volume status. Patient states she'd rather not do this procedure, knowing that it is a diagnostic procedure and not necessarily a cure for her multiple ailments. She says "my organs are wore out". I agree that her overall prognosis remains poor. We both mutually agreed that conservative management and involvement of palliative care is a very reasonable approach. I spoke with Dr. Broadus John and updated her regarding our conversation.I will follow the patient peripherally and be available for any further discussion and/or input.   Nigel Mormon, MD Pager: [phone] Office: [phone]

## 2022-02-20 NOTE — Progress Notes (Signed)
Physical Therapy Treatment Patient Details Name: Katelyn Mahoney MRN: [id number] DOB: [birth year] Today's Date: 02/20/2022   History of Present Illness 88 y.o. female admitted 02/14/22 with SOB,  weight gain, uncontrolled A-fib with acute on chronic HFpEF exacerbation. S/p DCCV 2/14. PMhx: admit on 01/08/22 for CHF exacerbation. DM II, HTN, obesity, CKD, HLD, chronic LE edema, chronic hypoxic respiratory failure on 4L O2, Afib, HfpEF    PT Comments    Able to amb very short distance in room with assist but fatigues quickly. Continue to recommend SNF at DC.    Recommendations for follow up therapy are one component of a multi-disciplinary discharge planning process, led by the attending physician.  Recommendations may be updated based on patient status, additional functional criteria and insurance authorization.  Follow Up Recommendations  Skilled nursing-short term rehab (<3 hours/day)     Assistance Recommended at Discharge Intermittent Supervision/Assistance  Patient can return home with the following A little help with walking and/or transfers;A little help with bathing/dressing/bathroom;Assistance with cooking/housework;Assist for transportation;Help with stairs or ramp for entrance   Equipment Recommendations  None recommended by PT    Recommendations for Other Services       Precautions / Restrictions Precautions Precautions: Fall;Other (comment) Precaution Comments: 4L O2 at baseline     Mobility  Bed Mobility Overal bed mobility: Needs Assistance Bed Mobility: Supine to Sit, Sit to Supine     Supine to sit: Min assist, HOB elevated Sit to supine: Min assist   General bed mobility comments: Assist to elevate trunk into sitting. Assist to bring legs back up into bed returning to supine.    Transfers Overall transfer level: Needs assistance Equipment used: Rolling walker (2 wheels) Transfers: Sit to/from Stand Sit to Stand: Min assist           General transfer  comment: Assist to bring hips up    Ambulation/Gait Ambulation/Gait assistance: Min guard Gait Distance (Feet): 5 Feet (5' forward and backward; 2' sidestepping) Assistive device: Rolling walker (2 wheels) Gait Pattern/deviations: Step-through pattern, Wide base of support, Decreased stride length Gait velocity: decreased Gait velocity interpretation: <1.31 ft/sec, indicative of household ambulator   General Gait Details: Assist for safety and lines. Pt on 4L O2 at rest. Incr to 6L for mobility with SpO2 to 85%.   Stairs             Wheelchair Mobility    Modified Rankin (Stroke Patients Only)       Balance Overall balance assessment: Needs assistance Sitting-balance support: No upper extremity supported, Feet supported Sitting balance-Leahy Scale: Good     Standing balance support: Bilateral upper extremity supported, Reliant on assistive device for balance Standing balance-Leahy Scale: Poor Standing balance comment: walker and min guard for static standing                            Cognition Arousal/Alertness: Awake/alert Behavior During Therapy: WFL for tasks assessed/performed Overall Cognitive Status: Within Functional Limits for tasks assessed                                          Exercises      General Comments General comments (skin integrity, edema, etc.): On 4L O2 at rest SpO2 92%. Amb on 6L with SpO2 to 85%.      Pertinent Vitals/Pain Pain Assessment Pain Assessment: No/denies pain  Home Living                          Prior Function            PT Goals (current goals can now be found in the care plan section) Acute Rehab PT Goals Patient Stated Goal: to go home Progress towards PT goals: Not progressing toward goals - comment    Frequency    Min 2X/week      PT Plan Current plan remains appropriate    Co-evaluation              AM-PAC PT "6 Clicks" Mobility   Outcome  Measure  Help needed turning from your back to your side while in a flat bed without using bedrails?: A Little Help needed moving from lying on your back to sitting on the side of a flat bed without using bedrails?: A Little Help needed moving to and from a bed to a chair (including a wheelchair)?: A Little Help needed standing up from a chair using your arms (e.g., wheelchair or bedside chair)?: A Little Help needed to walk in hospital room?: Total Help needed climbing 3-5 steps with a railing? : Total 6 Click Score: 14    End of Session Equipment Utilized During Treatment: Oxygen Activity Tolerance: Treatment limited secondary to medical complications (Comment);Patient limited by fatigue (dyspnea and decr SpO2) Patient left: in bed;with call bell/phone within reach;with bed alarm set Nurse Communication: Mobility status PT Visit Diagnosis: Muscle weakness (generalized) (M62.81);Unsteadiness on feet (R26.81);Other abnormalities of gait and mobility (R26.89);Difficulty in walking, not elsewhere classified (R26.2)     Time: 9458-5929 PT Time Calculation (min) (ACUTE ONLY): 19 min  Charges:  $Gait Training: 8-22 mins                     [place] Pager [phone] Office [place] 02/20/2022, 2:13 PM

## 2022-02-20 NOTE — Progress Notes (Signed)
PROGRESS NOTE    Katelyn Mahoney  YOV:[id number] DOB: 1934/03/17 DOA: 02/12/2022 PCP: Ann Held, DO  Narrative [name] with history of persistent A-fib, chronic diastolic CHF, right ventricular failure, type 2 diabetes mellitus, CKD 4, morbid obesity-BMI 46, chronic respiratory failure / COPD (long history of secondhand exposure -on 4 L home O2 for 2 to 3 months ) was brought to the ED with worsening dyspnea and hypoxia, in the ED she was in A-fib RVR with heart rate in the 120s, chest x-ray noted CHF -Admitted, started on diuretics, underwent cardioversion on 2/14 -Has been on high-dose diuretics, with suboptimal response-has CKD 4 -Now with UTI, started antibiotics 2/19 -Continues to have poor response to diuretics, cardiology discussed right heart cath with patient 2/20, patient declines this, wants conservative management -Palliative care consulted, s/p goals of care meeting, conservative management, goal for SNF, transition to hospice when declines further -Plan for SNF soon when respiratory status is more stable  Subjective: Continues to remain short of breath, is tired, wants to try rehab  Assessment & Plan  Acute on chronic hypoxic respiratory failure  Acute on chronic diastolic CHF -Multifactorial respiratory failure secondary to CHF-worsened by A-fib, obesity,? COPD (heavy 2ndary exposure to smoking-has been on 4L O2 for 2+months -Cardioverted to sinus rhythm on 2/14 -Output remains inaccurate, weight continues to trend up -Has responded poorly to diuretics, increased Lasix dose to 80 Mg 3 times daily 2 days ago, kidney function is stable, albumin is 3.1, will add metolazone today, recheck x-ray -Some component of her edema is likely from venous insufficiency, continue UNNA boots -Continue metoprolol -Discussed poor diuretic response with cardiology, Dr. Virgina Jock discussed with patient regarding right heart cath 2/20 -she declined this, wanted conservative management  only, overall prognosis is poor in the setting of chronic respiratory failure, CHF, CKD 4, morbid obesity, debility, poor response to diuretics etc. palliative care consulted, called and updated niece  -Goal is for short-term rehab when stable, transition to hospice when declines further  Atrial fibrillation with RVR -Cardioverted to sinus rhythm 2/14, continue metoprolol and diltiazem, in sinus rhythm now -Continue apixaban  CKD (chronic kidney disease), stage IV ([place])- (present on admission) -Baseline creatinine around 1.6-2 -Creatinine trended up to 2.8, then trended down to 2.3-2.4 and stable -Suspect cardiorenal, continue diuretics as above, BMP in a.m.  Acute on chronic anemia -Baseline hemoglobin around 8, anemia panel in January with severe iron deficiency -Hemoglobin down to 7.8 and stable, given 4 doses of IV iron -Patient denies any overt bleeding,  had an EGD and colonoscopy in January 01, 2022 which were unrevealing -CBC in a.m., may need transfusion if hemoglobin trends down further  UTI -Urine culture with pansensitive E. Coli and Enterobacter -Discussed with Pharm.D., Enterobacter is resistant to cephalosporins, changed to low-dose Levaquin X 3 days  Obesity, Class III, BMI 40-49.9 (morbid obesity) ([place])- (present on admission) Calculated BMI is 50.6  Diabetes mellitus ([place]) -Improving, increase Semglee dose yesterday, continue meal coverage  DVT prophylaxis: Apixaban Code Status: DNR Family Communication: No family at bedside, called and updated daughter Mariann Laster regarding poor prognosis 2/21,  updated niece June bullock 2/20 Disposition Plan: SNF when respiratory status more stable, transition to hospice if declines further  Consultants:  Cardiology  Procedures: DC cardioversion 2/14  Antimicrobials:    Objective: Vitals:   02/20/22 0523 02/20/22 0524 02/20/22 0837 02/20/22 1114  BP: (!) 109/40 (!) 116/32  (!) 126/47  Pulse: 65   70  Resp:    20  Temp: (!)  97.5 F (36.4 C)   97.6 F (36.4 C)  TempSrc: Oral   Oral  SpO2:   94% 93%  Weight:  (!) 137 kg    Height:        Intake/Output Summary (Last 24 hours) at 02/20/2022 1439 Last data filed at 02/20/2022 1119 Gross per 24 hour  Intake 600 ml  Output 200 ml  Net 400 ml   Filed Weights   02/18/22 0304 02/19/22 0315 02/20/22 0524  Weight: (!) 136.8 kg (!) 136.7 kg (!) 137 kg    Examination:  General exam: Morbidly obese chronically ill female sitting up in bed, AAO x3, appears uncomfortable, tachypneic HEENT: Positive JVD CVS: S1-S2, regular rate rhythm Lungs: Few basilar rales, decreased breath sounds Abdomen: Obese, soft, nontender, bowel sounds present Extremities: 1+ edema extending to upper thigh skin: No rashes Psychiatry: Judgement and insight appear normal. Mood & affect appropriate.     Data Reviewed:   CBC: Recent Labs  Lab 02/15/22 0408 02/16/22 0325 02/18/22 0501 02/19/22 0329 02/20/22 0746  WBC 10.0 11.5* 14.0* 12.3* 11.0*  HGB 7.8* 7.8* 7.9* 7.5* 7.7*  HCT 26.7* 27.8* 27.3* 26.5* 27.1*  MCV 99.6 102.6* 101.5* 101.9* 102.3*  PLT 462* 448* 422* 400 299   Basic Metabolic Panel: Recent Labs  Lab 02/14/22 0355 02/15/22 0408 02/17/22 0417 02/18/22 0501 02/18/22 1151 02/19/22 0329 02/20/22 0746  NA 139   < > 138 137 137 138 140  K 3.8   < > 3.6 3.7 3.5 4.3 3.5  CL 97*   < > 98 98 98 98 99  CO2 31   < > 31 30 28 28  33*  GLUCOSE 188*   < > 225* 161* 245* 171* 162*  BUN 64*   < > 53* 55* 55* 55* 52*  CREATININE 2.45*   < > 2.34* 2.34* 2.33* 2.43* 2.43*  CALCIUM 8.8*   < > 8.9 8.9 9.0 8.9 9.1  MG 2.6*  --   --   --   --   --   --    < > = values in this interval not displayed.   GFR: Estimated Creatinine Clearance: 23.4 mL/min (A) (by C-G formula based on SCr of 2.43 mg/dL (H)). Liver Function Tests: Recent Labs  Lab 02/19/22 0326  ALBUMIN 3.1*   No results for input(s): LIPASE, AMYLASE in the last 168 hours. No results for input(s): AMMONIA  in the last 168 hours. Coagulation Profile: No results for input(s): INR, PROTIME in the last 168 hours.  Cardiac Enzymes: No results for input(s): CKTOTAL, CKMB, CKMBINDEX, TROPONINI in the last 168 hours. BNP (last 3 results) No results for input(s): PROBNP in the last 8760 hours. HbA1C: No results for input(s): HGBA1C in the last 72 hours. CBG: Recent Labs  Lab 02/19/22 1120 02/19/22 1616 02/19/22 2026 02/20/22 0553 02/20/22 1113  GLUCAP 255* 251* 218* 156* 271*   Lipid Profile: No results for input(s): CHOL, HDL, LDLCALC, TRIG, CHOLHDL, LDLDIRECT in the last 72 hours. Thyroid Function Tests: No results for input(s): TSH, T4TOTAL, FREET4, T3FREE, THYROIDAB in the last 72 hours. Anemia Panel: No results for input(s): VITAMINB12, FOLATE, FERRITIN, TIBC, IRON, RETICCTPCT in the last 72 hours.  Urine analysis:    Component Value Date/Time   COLORURINE YELLOW 02/17/2022 1824   APPEARANCEUR HAZY (A) 02/17/2022 1824   LABSPEC 1.012 02/17/2022 1824   PHURINE 5.0 02/17/2022 1824   GLUCOSEU NEGATIVE 02/17/2022 1824   HGBUR SMALL (A) 02/17/2022 1824  BILIRUBINUR NEGATIVE 02/17/2022 1824   BILIRUBINUR Negative 03/09/2021 [place] 02/17/2022 1824   PROTEINUR 30 (A) 02/17/2022 1824   UROBILINOGEN 0.2 03/09/2021 0852   UROBILINOGEN 0.2 03/10/2009 0801   NITRITE NEGATIVE 02/17/2022 1824   LEUKOCYTESUR LARGE (A) 02/17/2022 1824   Sepsis Labs: @LABRCNTIP (procalcitonin:4,lacticidven:4)  ) Recent Results (from the past 240 hour(s))  Resp Panel by RT-PCR (Flu A&B, Covid) Nasopharyngeal Swab     Status: None   Collection Time: 02/12/22  2:27 PM   Specimen: Nasopharyngeal Swab; Nasopharyngeal(NP) swabs in vial transport medium  Result Value Ref Range Status   SARS Coronavirus 2 by RT PCR NEGATIVE NEGATIVE Final    Comment: (NOTE) SARS-CoV-2 target nucleic acids are NOT DETECTED.  The SARS-CoV-2 RNA is generally detectable in upper respiratory specimens during  the acute phase of infection. The lowest concentration of SARS-CoV-2 viral copies this assay can detect is 138 copies/mL. A negative result does not preclude SARS-Cov-2 infection and should not be used as the sole basis for treatment or other patient management decisions. A negative result may occur with  improper specimen collection/handling, submission of specimen other than nasopharyngeal swab, presence of viral mutation(s) within the areas targeted by this assay, and inadequate number of viral copies(<138 copies/mL). A negative result must be combined with clinical observations, patient history, and epidemiological information. The expected result is Negative.  Fact Sheet for Patients:  EntrepreneurPulse.com.au  Fact Sheet for Healthcare Providers:  IncredibleEmployment.be  This test is no t yet approved or cleared by the [place] FDA and  has been authorized for detection and/or diagnosis of SARS-CoV-2 by FDA under an Emergency Use Authorization (EUA). This EUA will remain  in effect (meaning this test can be used) for the duration of the COVID-19 declaration under Section 564(b)(1) of the Act, 21 U.S.C.section 360bbb-3(b)(1), unless the authorization is terminated  or revoked sooner.       Influenza A by PCR NEGATIVE NEGATIVE Final   Influenza B by PCR NEGATIVE NEGATIVE Final    Comment: (NOTE) The Xpert Xpress SARS-CoV-2/FLU/RSV plus assay is intended as an aid in the diagnosis of influenza from Nasopharyngeal swab specimens and should not be used as a sole basis for treatment. Nasal washings and aspirates are unacceptable for Xpert Xpress SARS-CoV-2/FLU/RSV testing.  Fact Sheet for Patients: EntrepreneurPulse.com.au  Fact Sheet for Healthcare Providers: IncredibleEmployment.be  This test is not yet approved or cleared by the [place] FDA and has been authorized for detection and/or  diagnosis of SARS-CoV-2 by FDA under an Emergency Use Authorization (EUA). This EUA will remain in effect (meaning this test can be used) for the duration of the COVID-19 declaration under Section 564(b)(1) of the Act, 21 U.S.C. section 360bbb-3(b)(1), unless the authorization is terminated or revoked.  Performed at [facility], [place] [address], [place], [place] [address]   Urine Culture     Status: Abnormal   Collection Time: 02/17/22 10:00 AM   Specimen: Urine, Clean Catch  Result Value Ref Range Status   Specimen Description URINE, CLEAN CATCH  Final   Special Requests   Final    NONE Performed at [facility], [place] [address], [place], [place] [address]    Culture (A)  Final    >=100,000 COLONIES/mL ESCHERICHIA COLI 60,000 COLONIES/mL ENTEROBACTER CLOACAE    Report Status 02/20/2022 FINAL  Final   Organism ID, Bacteria ESCHERICHIA COLI (A)  Final   Organism ID, Bacteria ENTEROBACTER CLOACAE (A)  Final  Susceptibility   Enterobacter cloacae - MIC*    CEFAZOLIN >=64 RESISTANT Resistant     CEFEPIME 0.25 SENSITIVE Sensitive     CIPROFLOXACIN <=0.25 SENSITIVE Sensitive     GENTAMICIN <=1 SENSITIVE Sensitive     IMIPENEM 1 SENSITIVE Sensitive     NITROFURANTOIN 32 SENSITIVE Sensitive     TRIMETH/SULFA <=20 SENSITIVE Sensitive     PIP/TAZO >=128 RESISTANT Resistant     * 60,000 COLONIES/mL ENTEROBACTER CLOACAE   Escherichia coli - MIC*    AMPICILLIN 8 SENSITIVE Sensitive     CEFAZOLIN <=4 SENSITIVE Sensitive     CEFEPIME <=0.12 SENSITIVE Sensitive     CEFTRIAXONE <=0.25 SENSITIVE Sensitive     CIPROFLOXACIN <=0.25 SENSITIVE Sensitive     GENTAMICIN <=1 SENSITIVE Sensitive     IMIPENEM <=0.25 SENSITIVE Sensitive     NITROFURANTOIN <=16 SENSITIVE Sensitive     TRIMETH/SULFA <=20 SENSITIVE Sensitive     AMPICILLIN/SULBACTAM 4 SENSITIVE Sensitive     PIP/TAZO <=4 SENSITIVE Sensitive     * >=100,000 COLONIES/mL ESCHERICHIA COLI     Radiology Studies: DG  CHEST PORT 1 VIEW  Result Date: 02/20/2022 CLINICAL DATA:  Diabetes, hypertension EXAM: PORTABLE CHEST 1 VIEW COMPARISON:  02/12/2022 FINDINGS: Pulmonary vascular congestion with mild interstitial edema. Mild progression of edema since the prior study. Bibasilar atelectasis. Small left effusion also unchanged. IMPRESSION: Congestive heart failure.  Mild progression of pulmonary edema. Electronically Signed   By: Franchot Gallo M.D.   On: 02/20/2022 11:10     Scheduled Meds:  apixaban  2.5 mg Oral BID   atorvastatin  10 mg Oral Q M,W,F,Su-1800   atorvastatin  20 mg Oral Q T,Th,Sat-1800   diltiazem  120 mg Oral Daily   furosemide  80 mg Intravenous Q8H   gabapentin  100 mg Oral BID   insulin aspart  0-5 Units Subcutaneous QHS   insulin aspart  0-9 Units Subcutaneous TID WC   insulin glargine-yfgn  20 Units Subcutaneous Daily   iron polysaccharides  150 mg Oral Daily   levofloxacin  750 mg Oral Q48H   loratadine  10 mg Oral Daily   metolazone  5 mg Oral Once   metoprolol tartrate  25 mg Oral BID   pantoprazole  40 mg Oral Daily   sertraline  100 mg Oral Daily   sodium chloride flush  3 mL Intravenous Q12H   umeclidinium bromide  1 puff Inhalation Daily   Continuous Infusions:  sodium chloride       LOS: 8 days    Time spent: 109min  Domenic Polite, MD [facility]   02/20/2022, 2:39 PM

## 2022-02-20 NOTE — Progress Notes (Signed)
Daily Progress Note   Patient Name: Katelyn Mahoney       Date: 02/20/2022 DOB: 1934-05-20  Age: 88 y.o. MRN#: [id number] Attending Physician: Domenic Polite, MD Primary Care Physician: Carollee Herter, Alferd Apa, DO Admit Date: 02/12/2022  Reason for Consultation/Follow-up: Establishing goals of care  Subjective: Medical records reviewed. Patient assessed at the bedside. She reports dyspnea is slightly worse today. No family present during my visit.  Created space and opportunity for patient's thoughts on her current illness and care preferences. Katelyn Mahoney was able to review the MOST form and we discussed the risks and benefits of interventions such as mechanical ventilation, IV fluids, antibiotics and feeding tubes in detail.  Katelyn Mahoney shares that she feels frightened of "smothering to death." We discussed end of life expectations and typical trajectory, reviewing the options for comfort-focused medications if consistent with her goals of care. Emotional support and therapeutic listening was provided. At this time, she remains interested in limited medical interventions to prolong her life. I emphasized the importance of ongoing discussions and reflecting on her quality of life, sharing with her family and care team when treatments become more burdensome than beneficial.     Questions and concerns addressed. PMT will continue to support holistically.  Length of Stay: 8  Current Medications: Scheduled Meds:   apixaban  2.5 mg Oral BID   atorvastatin  10 mg Oral Q M,W,F,Su-1800   atorvastatin  20 mg Oral Q T,Th,Sat-1800   diltiazem  120 mg Oral Daily   furosemide  80 mg Intravenous Q8H   gabapentin  100 mg Oral BID   insulin aspart  0-5 Units Subcutaneous QHS   insulin aspart  0-9  Units Subcutaneous TID WC   insulin glargine-yfgn  20 Units Subcutaneous Daily   iron polysaccharides  150 mg Oral Daily   levofloxacin  750 mg Oral Q48H   loratadine  10 mg Oral Daily   metoprolol tartrate  25 mg Oral BID   pantoprazole  40 mg Oral Daily   sertraline  100 mg Oral Daily   sodium chloride flush  3 mL Intravenous Q12H   umeclidinium bromide  1 puff Inhalation Daily    Continuous Infusions:  sodium chloride      PRN Meds: sodium chloride, acetaminophen, fluocinonide cream, levalbuterol, metoprolol tartrate, ondansetron (ZOFRAN) IV, sodium chloride, sodium  chloride flush  Physical Exam Vitals and nursing note reviewed.  Constitutional:      General: She is not in acute distress.    Appearance: She is obese. She is ill-appearing.     Interventions: Nasal cannula in place.     Comments: 4L  Cardiovascular:     Rate and Rhythm: Normal rate and regular rhythm.  Pulmonary:     Effort: Pulmonary effort is normal.  Neurological:     Mental Status: She is alert and oriented to person, place, and time.  Psychiatric:        Mood and Affect: Mood normal.        Behavior: Behavior is cooperative.            Vital Signs: BP (!) 116/32    Pulse 65    Temp (!) 97.5 F (36.4 C) (Oral)    Resp 17    Ht 5\' 4"  (1.626 m)    Wt (!) 137 kg    LMP  (LMP Unknown)    SpO2 94%    BMI 51.84 kg/m  SpO2: SpO2: 94 % O2 Device: O2 Device: Nasal Cannula O2 Flow Rate: O2 Flow Rate (L/min): 4 L/min  Intake/output summary:  Intake/Output Summary (Last 24 hours) at 02/20/2022 1110 Last data filed at 02/20/2022 0600 Gross per 24 hour  Intake 660 ml  Output --  Net 660 ml   LBM: Last BM Date : 02/19/22 Baseline Weight: Weight: 131.1 kg Most recent weight: Weight: (!) 137 kg       Palliative Assessment/Data: 50%      Patient Active Problem List   Diagnosis Date Noted   Paroxysmal atrial fibrillation (HCC)    Atrial fibrillation status post cardioversion [facility])     Cardiorenal syndrome    Anemia    Pulmonary nodule 02/14/2022   Chronic atrial fibrillation with RVR (HCC) 02/13/2022   Chronic kidney disease (CKD), stage IV (severe) (HCC) 02/13/2022   CHF (congestive heart failure) ([place]) 02/12/2022   Acute on chronic respiratory failure with hypoxia (HCC) 02/08/2022   Gastrointestinal hemorrhage    Persistent atrial fibrillation (HCC)    Acute exacerbation of CHF (congestive heart failure) ([place]) 01/09/2022   Atrial fibrillation with RVR (HCC)    Acute congestive heart failure (HCC)    Urinary tract infection 12/13/2021   AKI (acute kidney injury) ([place]) 12/13/2021   At risk for sepsis 12/13/2021   Primary osteoarthritis of both knees 03/09/2021   Urinary frequency 03/09/2021   Depression, major, single episode, in partial remission ([place]) 03/09/2021   Edema of lower extremity 08/20/2017   Hyperlipidemia 08/20/2017   CKD stage G3b/A1, GFR 30-44 and albumin creatinine ratio <30 mg/g (HCC) 02/27/2017   Allergic urticaria 08/02/2016   Fungal dermatitis 11/08/2015   Right leg pain 10/14/2015   Goals of care, counseling/discussion 11/09/2014   Dermatophytosis of nail 09/24/2013   Arthritis    Claw toe (acquired) 04/17/2013   Hammer toe, acquired 04/17/2013   Spherocytosis, hereditary ([place]) 08/30/2011   Diabetes mellitus ([place]) 10/17/2010   Obesity, Class III, BMI 40-49.9 (morbid obesity) ([place]) 03/19/2008   Primary hypertension 03/19/2008   ROTATOR CUFF SPRAIN AND STRAIN 03/19/2008   Hyperlipidemia associated with type 2 diabetes mellitus ([place]) 09/05/2007   Depression 09/05/2007   ROSACEA 05/19/2007    Palliative Care Assessment & Plan   Patient Profile: 88 y.o. female  with past medical history of persistent A-fib, chronic diastolic CHF, right ventricular failure, insulin-dependent type 2 diabetes mellitus, CKD 4, morbid obesity-BMI  51, chronic respiratory failure admitted on 02/12/2022 with worsening dyspnea and  hypoxia, as well as worsening leg swelling and ulcers over the left ankle.   In the ED, patient was in A-fib RVR with heart rate in the 120s, chest x-ray noted CHF. S/p cardioversion on 2/14 with overall poor prognosis. Today Cr is slightly increased from 2.33 -> 2.43 and glucose remains elevated. Palliative medicine has been consulted to assist with goals of care conversation.  Assessment: Goals of care conversation Acute on chronic hypoxic respiratory failure, stable Acute on chronic CHF CKD4  Recommendations/Plan: MOST form/DNR gold form signed and placed in hard chart. Copies provided to patient and scanned into [employer] Patient's goal is to proceed with trial of rehab and outpatient palliative care Psychosocial and emotional support provided PMT will continue to support and monitor. Please call or secure chat with urgent needs  Goals of Care and Additional Recommendations: Limitations on Scope of Treatment: No Artificial Feeding and No mechanical ventilation  Code Status: DNR   Code Status Orders  (From admission, onward)           Start     Ordered   02/12/22 1548  Do not attempt resuscitation (DNR)  Continuous       Question Answer Comment  In the event of cardiac or respiratory ARREST Do not call a code blue   In the event of cardiac or respiratory ARREST Do not perform Intubation, CPR, defibrillation or ACLS   In the event of cardiac or respiratory ARREST Use medication by any route, position, wound care, and other measures to relive pain and suffering. May use oxygen, suction and manual treatment of airway obstruction as needed for comfort.      02/12/22 1549           Code Status History     Date Active Date Inactive Code Status Order ID Comments User Context   01/09/2022 1335 01/26/2022 2158 DNR [id number]  Harvie Heck, MD ED   12/12/2021 1645 12/27/2021 1801 DNR [id number]  Iona Beard, MD ED      Advance Directive Documentation    [place] Most  Recent Value  Type of Advance Directive Living will  Pre-existing out of facility DNR order (yellow form or pink MOST form) --  "MOST" Form in Place? --       Prognosis:  Poor prognosis given acute exacerbation of multiple chronic conditions, functional decline, and advanced age  Discharge Planning: [place] for rehab with Palliative care service follow-up   Total time: I spent 50 minutes in the care of the patient today in the above activities and documenting the encounter.   Dorthy Cooler, PA-C Palliative Medicine Team Team phone # [phone]  Thank you for allowing the Palliative Medicine Team to assist in the care of this patient. Please utilize secure chat with additional questions, if there is no response within 30 minutes please call the above phone number.  Palliative Medicine Team providers are available by phone from 7am to 7pm daily and can be reached through the team cell phone.  Should this patient require assistance outside of these hours, please call the patient's attending physician.

## 2022-02-20 NOTE — Progress Notes (Signed)
Mobility Specialist Progress Note:   02/20/22 1445  Mobility  Activity  (bed exercises)  Range of Motion/Exercises Active;All extremities  Assistive Device None  Activity Response Tolerated fair  $Mobility charge 1 Mobility   Pt agreeable to mobility session, however declined OOB. Agreeable to bed level exercises this afternoon. Performed multiple exercises with all extremities. Pt desat to 87% on 4LO2 at times throughout session, states she "cant breathe out of my nose'. RN notified. SpO2 92% at end of session.   Nelta Numbers Acute Rehab Phone: [phone] Office Phone: [phone]

## 2022-02-21 DIAGNOSIS — E1121 Type 2 diabetes mellitus with diabetic nephropathy: Secondary | ICD-10-CM | POA: Diagnosis not present

## 2022-02-21 DIAGNOSIS — I5033 Acute on chronic diastolic (congestive) heart failure: Secondary | ICD-10-CM | POA: Diagnosis not present

## 2022-02-21 DIAGNOSIS — N184 Chronic kidney disease, stage 4 (severe): Secondary | ICD-10-CM | POA: Diagnosis not present

## 2022-02-21 DIAGNOSIS — I4891 Unspecified atrial fibrillation: Secondary | ICD-10-CM | POA: Diagnosis not present

## 2022-02-21 DIAGNOSIS — N390 Urinary tract infection, site not specified: Secondary | ICD-10-CM | POA: Diagnosis not present

## 2022-02-21 LAB — CBC
HCT: 27.3 % — ABNORMAL LOW (ref 36.0–46.0)
Hemoglobin: 7.8 g/dL — ABNORMAL LOW (ref 12.0–15.0)
MCH: 28.8 pg (ref 26.0–34.0)
MCHC: 28.6 g/dL — ABNORMAL LOW (ref 30.0–36.0)
MCV: 100.7 fL — ABNORMAL HIGH (ref 80.0–100.0)
Platelets: 383 10*3/uL (ref 150–400)
RBC: 2.71 MIL/uL — ABNORMAL LOW (ref 3.87–5.11)
RDW: 18 % — ABNORMAL HIGH (ref 11.5–15.5)
WBC: 11.3 10*3/uL — ABNORMAL HIGH (ref 4.0–10.5)
nRBC: 1.5 % — ABNORMAL HIGH (ref 0.0–0.2)

## 2022-02-21 LAB — BASIC METABOLIC PANEL
Anion gap: 11 (ref 5–15)
BUN: 52 mg/dL — ABNORMAL HIGH (ref 8–23)
CO2: 33 mmol/L — ABNORMAL HIGH (ref 22–32)
Calcium: 9.1 mg/dL (ref 8.9–10.3)
Chloride: 96 mmol/L — ABNORMAL LOW (ref 98–111)
Creatinine, Ser: 2.34 mg/dL — ABNORMAL HIGH (ref 0.44–1.00)
GFR, Estimated: 20 mL/min — ABNORMAL LOW (ref >60)
Glucose, Bld: 140 mg/dL — ABNORMAL HIGH (ref 70–99)
Potassium: 3.3 mmol/L — ABNORMAL LOW (ref 3.5–5.1)
Sodium: 140 mmol/L (ref 135–145)

## 2022-02-21 LAB — GLUCOSE, CAPILLARY
Glucose-Capillary: 154 mg/dL — ABNORMAL HIGH (ref 70–99)
Glucose-Capillary: 235 mg/dL — ABNORMAL HIGH (ref 70–99)
Glucose-Capillary: 195 mg/dL — ABNORMAL HIGH (ref 70–99)
Glucose-Capillary: 203 mg/dL — ABNORMAL HIGH (ref 70–99)

## 2022-02-21 MED ORDER — METOLAZONE 5 MG PO TABS
5.0000 mg | ORAL_TABLET | Freq: Once | ORAL | Status: AC
  Administered 2022-02-21: 5 mg via ORAL
  Filled 2022-02-21: qty 1

## 2022-02-21 MED ORDER — POTASSIUM CHLORIDE CRYS ER 20 MEQ PO TBCR
40.0000 meq | EXTENDED_RELEASE_TABLET | Freq: Once | ORAL | Status: AC
  Administered 2022-02-21: 40 meq via ORAL
  Filled 2022-02-21: qty 2

## 2022-02-21 NOTE — Progress Notes (Signed)
Mobility Specialist Progress Note:   02/21/22 1000  Mobility  Activity Ambulated with assistance in room  Level of Assistance Contact guard assist, steadying assist  Assistive Device Front wheel walker  Distance Ambulated (ft) 10 ft  Activity Response Tolerated fair  $Mobility charge 1 Mobility   Pt eager for mobility this am. Not requiring any physical assistance throughout session. Pt desat to 82% on 4LO2, recovered to >90% with 6LO2. Pt left back in bed, on 4LO2 with SpO2 93%.   Nelta Numbers Acute Rehab Phone: [phone] Office Phone: [phone]

## 2022-02-21 NOTE — Progress Notes (Signed)
Progress Note   Patient: Katelyn Mahoney OIN:[id number] DOB: April 02, 1934 DOA: 02/12/2022     9 DOS: the patient was seen and examined on 02/21/2022   Brief hospital course: Katelyn Mahoney was admitted to the hospital with the working diagnosis acute decompensated heart failure.   88 yo female with the past medical history of diastolic heart failure, CKD stage IV, atrial fibrillation, T2DM, obesity class 3, and chronic anemia. Reported worsening lower extremity edema, with worsening dyspnea on exertion, and oxygen desaturation down to 80%. Positive palpitations, and chest pressure. Symptoms worsening despite increased dose of oral diuretic therapy at home. On the day of her hospitalization she was found in respiratory distress by home nurse and patient was transported to the ED. On her initial physical examination her blood pressure was 125/75, HR 60, RR 21 and 02 saturation 95% on supplemental 02 per [place], lungs with rales bilaterally with increased work of breathing, and accessory muscle use. Heart with S1 and S2 present and tachycardic irregularly irregular with no gallops, abdomen soft and positive lower extremity edema.   Na 139, K 4,0, CL 97, bicarbonate 30, glucose 173, bun 66 and cr 2,6  Bnp 361 Wbc 11,1 hgb 8,0 hct 29,5 plt 507  SARS covid 19 negative   Chest radiograph with bilateral hilar vascular congestion, small left pleural effusion (personally reviewed).   EKG 125 bpm, with left axis deviation, atrial fibrillation with positive PVC and PAC, with no significant ST segment or T wave changes.   Patient was placed on furosemide for diuresis. 02/14 underwent cardioversion, converting to sinus rhythm.  02/19 urinary tract infection.  Patient continue volume overloaded and was offered cardiac catheterization that she has declined. Palliative care services have been consulted.   Assessment and Plan: * CHF (congestive heart failure) ([place]) .Echocardiogram from 12/2021 personally reviewed  old records with EF LV 50 to 55%, with mild reduction on RV, moderate dilatation of bilateral atriums, moderate tricuspid regurgitation. Unable to determinate PA pressures.   Continue with hypervolemia. Urine output is 2300 ml over last 24 hrs.  Plan to continue diuresis with IV furosemide.  Continue blood pressure control with metoprolol.     Atrial fibrillation with RVR ([place])- (present on admission) Sp cardioversion DC.  Continue rate control with diltiazem and anticoagulation with apixaban.  Continue telemetry monitoring.   Chronic kidney disease (CKD), stage IV (severe) (HCC) Hypokalemia  Patient continue hypervolemic.  Renal function with serum cr at 2,34 with K at 3,3 and serum bicarbonate at 33.  Plan to add 40 meq kcl and follow up renal function in am Continue diuresis with IV furosemide.   Anemia of chronic renal disease, hgb is 7,8 and hct at 27.3    Diabetes mellitus (HCC) Continue glucose cover and monitoring with insulin sliding scalae for glucose cover and monitoring.   Primary hypertension- (present on admission) Continue blood pressure control with diltiazem and metoprolol. Continue diuresis with furosemide.   Obesity, Class III, BMI 40-49.9 (morbid obesity) ([place])- (present on admission) Calculated BMI is 50.6  UTI (urinary tract infection) Continue antibiotic therapy with renal dose levofloxacin for 2 doses.         Subjective: patient is feeling congested, continue to have edema and dyspnea, no chest pain   Physical Exam: Vitals:   02/21/22 0605 02/21/22 0723 02/21/22 1100 02/21/22 1115  BP: (!) 113/50   (!) 112/55  Pulse:    84  Resp:    17  Temp:    97.9 F (36.6 C)  TempSrc:    Oral  SpO2:  100% 98% 98%  Weight:      Height:       Neurology awake and alert ENT with no pallor Cardiovascular with S1 and S2 present, no gallops or murmurs Positive JVD Positive lower extremity edema +++ pitting Respiratory with rales scattered but no  wheezing Abdomen soft and non tender   Data Reviewed:    Family Communication: no family at the bedside   Disposition: Status is: Inpatient Remains inpatient appropriate because: heart failure      Planned Discharge Destination: Home     Author: Tawni Millers, MD 02/21/2022 6:18 PM  For on call review [email or web address].

## 2022-02-21 NOTE — Assessment & Plan Note (Addendum)
Continue antibiotic therapy with renal dose completed therapy with levofloxacin

## 2022-02-21 NOTE — Progress Notes (Signed)
Occupational Therapy Treatment Patient Details Name: Katelyn Mahoney MRN: [id number] DOB: [birth year] Today's Date: 02/21/2022   History of present illness 88 y.o. female admitted 02/14/22 with SOB,  weight gain, uncontrolled A-fib with acute on chronic HFpEF exacerbation. S/p DCCV 2/14. PMhx: admit on 01/08/22 for CHF exacerbation. DM II, HTN, obesity, CKD, HLD, chronic LE edema, chronic hypoxic respiratory failure on 4L O2, Afib, HfpEF   OT comments  Patient on bedside commode upon OTs arrival. Patient previously working with mobility tech, with need for increased oxygen (6L) due to desatting with minimal functional ambulation. Patients oxygen saturations remaining stable for therapist's session however with increased DOE for basic transfers. Patient able to complete additional sit<>stand from EOB, and complete shuffle steps towards HOB. Discharge remains appropriate, therapy will continue to follow.    Recommendations for follow up therapy are one component of a multi-disciplinary discharge planning process, led by the attending physician.  Recommendations may be updated based on patient status, additional functional criteria and insurance authorization.    Follow Up Recommendations  Skilled nursing-short term rehab (<3 hours/day)    Assistance Recommended at Discharge Intermittent Supervision/Assistance  Patient can return home with the following  Help with stairs or ramp for entrance;A little help with bathing/dressing/bathroom;A little help with walking and/or transfers;Assistance with cooking/housework;Direct supervision/assist for financial management;Direct supervision/assist for medications management   Equipment Recommendations  None recommended by OT    Recommendations for Other Services      Precautions / Restrictions Precautions Precautions: Fall;Other (comment) Precaution Comments: 4L O2 at baseline Restrictions Weight Bearing Restrictions: No       Mobility Bed  Mobility Overal bed mobility: Needs Assistance Bed Mobility: Sit to Supine       Sit to supine: Min assist   General bed mobility comments: Assist to bring legs back up into bed returning to supine.    Transfers Overall transfer level: Needs assistance Equipment used: Rolling walker (2 wheels) Transfers: Sit to/from Stand Sit to Stand: Min assist                 Balance Overall balance assessment: Needs assistance Sitting-balance support: No upper extremity supported, Feet supported Sitting balance-Leahy Scale: Good Sitting balance - Comments: supervision   Standing balance support: Bilateral upper extremity supported, Reliant on assistive device for balance Standing balance-Leahy Scale: Poor                             ADL either performed or assessed with clinical judgement   ADL Overall ADL's : Needs assistance/impaired                         Toilet Transfer: Min guard;Ambulation;Rolling walker (2 wheels);BSC/3in1   Toileting- Clothing Manipulation and Hygiene: Sit to/from stand;Maximal assistance Toileting - Clothing Manipulation Details (indicate cue type and reason): requiring increased assist for peri-care to date     Functional mobility during ADLs: Moderate assistance;Rolling walker (2 wheels) General ADL Comments: Patient requiring increased assist and with increased DOE with activity to date    Extremity/Trunk Assessment              Vision       Perception     Praxis      Cognition Arousal/Alertness: Awake/alert Behavior During Therapy: WFL for tasks assessed/performed Overall Cognitive Status: Within Functional Limits for tasks assessed  Exercises      Shoulder Instructions       General Comments      Pertinent Vitals/ Pain       Pain Assessment Pain Assessment: Faces Faces Pain Scale: Hurts a little bit Pain Location: generalized with  mobility Pain Descriptors / Indicators: Discomfort, Grimacing, Guarding Pain Intervention(s): Limited activity within patient's tolerance, Monitored during session, Repositioned  Home Living                                          Prior Functioning/Environment              Frequency  Min 2X/week        Progress Toward Goals  OT Goals(current goals can now be found in the care plan section)  Progress towards OT goals: Progressing toward goals  Acute Rehab OT Goals Patient Stated Goal: to feel better OT Goal Formulation: With patient Time For Goal Achievement: 02/28/22 Potential to Achieve Goals: [place] Discharge plan remains appropriate    Co-evaluation                 AM-PAC OT "6 Clicks" Daily Activity     Outcome Measure   Help from another person eating meals?: None Help from another person taking care of personal grooming?: A Little Help from another person toileting, which includes using toliet, bedpan, or urinal?: A Lot Help from another person bathing (including washing, rinsing, drying)?: A Lot Help from another person to put on and taking off regular upper body clothing?: A Little Help from another person to put on and taking off regular lower body clothing?: A Lot 6 Click Score: 16    End of Session Equipment Utilized During Treatment: Rolling walker (2 wheels);Gait belt  OT Visit Diagnosis: Unsteadiness on feet (R26.81);Other abnormalities of gait and mobility (R26.89);Muscle weakness (generalized) (M62.81)   Activity Tolerance Patient limited by fatigue   Patient Left in bed;with call bell/phone within reach   Nurse Communication Mobility status        Time: 1255-1315 OT Time Calculation (min): 20 min  Charges: OT General Charges $OT Visit: 1 Visit OT Treatments $Self Care/Home Management : 8-22 mins  Corinne Ports E. Arissa Fagin, OTR/L Acute Rehabilitation Services [phone] [place] 02/21/2022, 3:27 PM

## 2022-02-22 DIAGNOSIS — I5033 Acute on chronic diastolic (congestive) heart failure: Secondary | ICD-10-CM | POA: Diagnosis not present

## 2022-02-22 DIAGNOSIS — N184 Chronic kidney disease, stage 4 (severe): Secondary | ICD-10-CM | POA: Diagnosis not present

## 2022-02-22 DIAGNOSIS — I4891 Unspecified atrial fibrillation: Secondary | ICD-10-CM | POA: Diagnosis not present

## 2022-02-22 LAB — BASIC METABOLIC PANEL
Anion gap: 9 (ref 5–15)
BUN: 50 mg/dL — ABNORMAL HIGH (ref 8–23)
CO2: 36 mmol/L — ABNORMAL HIGH (ref 22–32)
Calcium: 9.2 mg/dL (ref 8.9–10.3)
Chloride: 96 mmol/L — ABNORMAL LOW (ref 98–111)
Creatinine, Ser: 2.48 mg/dL — ABNORMAL HIGH (ref 0.44–1.00)
GFR, Estimated: 19 mL/min — ABNORMAL LOW (ref >60)
Glucose, Bld: 178 mg/dL — ABNORMAL HIGH (ref 70–99)
Potassium: 3.6 mmol/L (ref 3.5–5.1)
Sodium: 141 mmol/L (ref 135–145)

## 2022-02-22 LAB — GLUCOSE, CAPILLARY
Glucose-Capillary: 186 mg/dL — ABNORMAL HIGH (ref 70–99)
Glucose-Capillary: 191 mg/dL — ABNORMAL HIGH (ref 70–99)
Glucose-Capillary: 216 mg/dL — ABNORMAL HIGH (ref 70–99)
Glucose-Capillary: 153 mg/dL — ABNORMAL HIGH (ref 70–99)

## 2022-02-22 LAB — MAGNESIUM: Magnesium: 2 mg/dL (ref 1.7–2.4)

## 2022-02-22 MED ORDER — ACETAZOLAMIDE 250 MG PO TABS
250.0000 mg | ORAL_TABLET | Freq: Two times a day (BID) | ORAL | Status: DC
  Administered 2022-02-22 – 2022-03-01 (×16): 250 mg via ORAL
  Filled 2022-02-22 (×17): qty 1

## 2022-02-22 MED ORDER — DIPHENOXYLATE-ATROPINE 2.5-0.025 MG/5ML PO LIQD
5.0000 mL | Freq: Four times a day (QID) | ORAL | Status: DC | PRN
  Administered 2022-02-22 – 2022-02-26 (×3): 5 mL via ORAL
  Filled 2022-02-22 (×3): qty 5

## 2022-02-22 MED ORDER — SPIRONOLACTONE 25 MG PO TABS
25.0000 mg | ORAL_TABLET | Freq: Every day | ORAL | Status: DC
  Administered 2022-02-22 – 2022-02-28 (×7): 25 mg via ORAL
  Filled 2022-02-22 (×7): qty 1

## 2022-02-22 NOTE — Progress Notes (Signed)
Orthopedic Tech Progress Note Patient Details:  Katelyn Mahoney 07-05-1934 [id number]  Ortho Devices Type of Ortho Device: Haematologist Ortho Device/Splint Location: BLE Ortho Device/Splint Interventions: Application, Ordered   Post Interventions Patient Tolerated: Well Instructions Provided: Care of device  Delbra Zellars A Jaron Czarnecki 02/22/2022, 2:15 PM

## 2022-02-22 NOTE — Plan of Care (Signed)

## 2022-02-22 NOTE — Progress Notes (Signed)
Progress Note   Patient: Katelyn Mahoney VOJ:[id number] DOB: 07/10/1934 DOA: 02/12/2022     10 DOS: the patient was seen and examined on 02/22/2022   Brief hospital course: Mrs. Borromeo was admitted to the hospital with the working diagnosis acute decompensated heart failure.   88 yo female with the past medical history of diastolic heart failure, CKD stage IV, atrial fibrillation, T2DM, obesity class 3, and chronic anemia. Reported worsening lower extremity edema, with worsening dyspnea on exertion, and oxygen desaturation down to 80%. Positive palpitations, and chest pressure. Symptoms worsening despite increased dose of oral diuretic therapy at home. On the day of her hospitalization she was found in respiratory distress by home nurse and patient was transported to the ED. On her initial physical examination her blood pressure was 125/75, HR 60, RR 21 and 02 saturation 95% on supplemental 02 per [place], lungs with rales bilaterally with increased work of breathing, and accessory muscle use. Heart with S1 and S2 present and tachycardic irregularly irregular with no gallops, abdomen soft and positive lower extremity edema.   Na 139, K 4,0, CL 97, bicarbonate 30, glucose 173, bun 66 and cr 2,6  Bnp 361 Wbc 11,1 hgb 8,0 hct 29,5 plt 507  SARS covid 19 negative   Chest radiograph with bilateral hilar vascular congestion, small left pleural effusion (personally reviewed).   EKG 125 bpm, with left axis deviation, atrial fibrillation with positive PVC and PAC, with no significant ST segment or T wave changes.   Patient was placed on furosemide for diuresis. 02/14 underwent cardioversion, converting to sinus rhythm.  02/19 urinary tract infection.  Patient continue volume overloaded and was offered cardiac catheterization that she has declined. Palliative care services have been consulted.   Continue volume overloaded, despite IV diuresis.   Assessment and Plan: * CHF (congestive heart failure)  (HCC) Echocardiogram from 12/2021 personally reviewed old records with EF LV 50 to 55%, with mild reduction on RV, moderate dilatation of bilateral atriums, moderate tricuspid regurgitation. Unable to determinate PA pressures.   Persistent hypervolemia. Urine output is 2,100 ml over last 24 hrs. Blood pressure systolic 993 mmHg.   Had one dose of metolazone yesterday. Continue diuresis with furosemide 80 mg IV q8 hrs.  Heart failure with metoprolol.  Holding raas inhibition due to risk of worsening renal function.    Atrial fibrillation with RVR ([place])- (present on admission) Sp cardioversion DC.  Rate control with diltiazem and metoprolol Continue anticoagulation with apixaban Continue telemetry monitoring and aggressive diuresis.   Chronic kidney disease (CKD), stage IV (severe) ([place])- (present on admission) Hypokalemia  Patient continue hypervolemic.  Renal function with serum cr at 2,48 with K at 3,6 and serum bicarboante 36.   Plan to continue diuresis with furosemide 80 mg IV q12 hrs Will add spironolactone and acetazolamide.  Follow up renal function and electrolytes in am.   Anemia of chronic renal disease, hgb is 7,8 and hct at 27.3    Type 2 diabetes mellitus with hyperlipidemia (HCC) Continue glucose cover and monitoring with insulin sliding scalae for glucose cover and monitoring.  Fasting glucose this am 178 mg/dl.   Continue with statin therapy.   Primary hypertension- (present on admission) Blood pressure has been controlled, continue with metoprolol and diltiazem. Diuresis with furosemide, acetazolamide and spironolactone.   Obesity, Class III, BMI 40-49.9 (morbid obesity) ([place])- (present on admission) Calculated BMI is 50.6  UTI (urinary tract infection) Continue antibiotic therapy with renal dose levofloxacin for 2 doses.  Subjective: Patient continue to be very weak and deconditioned, her dyspnea had some improvement compared to  yesterday but not back to baseline   Physical Exam: Vitals:   02/22/22 0535 02/22/22 0600 02/22/22 0727 02/22/22 1046  BP: (!) 114/51  110/69 104/67  Pulse: 92  86 83  Resp: 18  19 (!) 23  Temp: 97.7 F (36.5 C) 97.9 F (36.6 C) 98.3 F (36.8 C) 98 F (36.7 C)  TempSrc: Oral  Oral Oral  SpO2: 97%  97% 97%  Weight: 133.7 kg     Height:       Neurology awake and alert ENT with no pallor Cardiovascular with S1 and S2 present, no gallops or murmurs, no rubs Mild JVD Positive bilateral lower extremity edema +++ pitting up to the thighs  Respiratory with scattered rales at the dependent zones with no wheezing, or rhonchi Abdomen protuberant.   Data Reviewed:    Family Communication: no family at the bedside   Disposition: Status is: Inpatient Remains inpatient appropriate because: heart failure management      Planned Discharge Destination: Home     Author: Tawni Millers, MD 02/22/2022 2:32 PM  For on call review [email or web address].

## 2022-02-22 NOTE — Progress Notes (Signed)
Daily Progress Note   Patient Name: Katelyn Mahoney       Date: 02/22/2022 DOB: November 01, 1934  Age: 88 y.o. MRN#: [id number] Attending Physician: Tawni Millers Primary Care Physician: Carollee Herter, Alferd Apa, DO Admit Date: 02/12/2022  Reason for Consultation/Follow-up: Establishing goals of care  Subjective: Medical records reviewed. Patient assessed. She denies pain or distress.  We discussed patient's worries about her dyspnea and she reports feeling better about symptom management options and availability of hospice support in the likely event that her health continues to worsen. She tells me that she has decided to go with [facility] PT rather than SNF because "it probably won't do me any good anyway and if that's the case I'd rather be at home."   Counseled on the difficulty of managing her heart failure without worsening her kidney function. Discussed her thoughts on aggressive measures such as dialysis and she states she would probably not want that. Ultimately, Ms. Wulff is at peace with her prognosis. While she is currently focused on efforts to improve as much as possible, she would be willing to transition to hospice care if/when she declines further despite conservative management of her chronic illnesses.  Questions and concerns addressed. PMT will continue to support holistically.  Length of Stay: 10  Current Medications: Scheduled Meds:   apixaban  2.5 mg Oral BID   atorvastatin  10 mg Oral Q M,W,F,Su-1800   atorvastatin  20 mg Oral Q T,Th,Sat-1800   diltiazem  120 mg Oral Daily   furosemide  80 mg Intravenous Q8H   gabapentin  100 mg Oral BID   insulin aspart  0-5 Units Subcutaneous QHS   insulin aspart  0-9 Units Subcutaneous TID WC   insulin glargine-yfgn  20 Units  Subcutaneous Daily   iron polysaccharides  150 mg Oral Daily   levofloxacin  750 mg Oral Q48H   loratadine  10 mg Oral Daily   metoprolol tartrate  25 mg Oral BID   pantoprazole  40 mg Oral Daily   sertraline  100 mg Oral Daily   sodium chloride flush  3 mL Intravenous Q12H   umeclidinium bromide  1 puff Inhalation Daily    Continuous Infusions:  sodium chloride      PRN Meds: sodium chloride, acetaminophen, fluocinonide cream, guaiFENesin, levalbuterol, metoprolol tartrate, ondansetron (ZOFRAN) IV,  sodium chloride, sodium chloride flush  Physical Exam Vitals and nursing note reviewed.  Constitutional:      General: She is not in acute distress.    Appearance: She is obese. She is ill-appearing.     Interventions: Nasal cannula in place.     Comments: 4L  Cardiovascular:     Rate and Rhythm: Normal rate and regular rhythm.  Pulmonary:     Effort: Pulmonary effort is normal.  Neurological:     Mental Status: She is alert and oriented to person, place, and time.  Psychiatric:        Mood and Affect: Mood normal.        Behavior: Behavior is cooperative.            Vital Signs: BP 110/69    Pulse 86    Temp 98.3 F (36.8 C) (Oral)    Resp 19    Ht 5\' 4"  (1.626 m)    Wt 133.7 kg    LMP  (LMP Unknown)    SpO2 97%    BMI 50.59 kg/m  SpO2: SpO2: 97 % O2 Device: O2 Device: Nasal Cannula O2 Flow Rate: O2 Flow Rate (L/min): 4 L/min  Intake/output summary:  Intake/Output Summary (Last 24 hours) at 02/22/2022 1030 Last data filed at 02/22/2022 0093 Gross per 24 hour  Intake 180 ml  Output 2100 ml  Net -1920 ml    LBM: Last BM Date : 02/22/22 Baseline Weight: Weight: 131.1 kg Most recent weight: Weight: 133.7 kg       Palliative Assessment/Data: 50%      Patient Active Problem List   Diagnosis Date Noted   UTI (urinary tract infection) 02/21/2022   Paroxysmal atrial fibrillation (HCC)    Atrial fibrillation status post cardioversion [facility])    Cardiorenal syndrome     Anemia    Pulmonary nodule 02/14/2022   Chronic atrial fibrillation with RVR (HCC) 02/13/2022   Chronic kidney disease (CKD), stage IV (severe) (HCC) 02/13/2022   CHF (congestive heart failure) ([place]) 02/12/2022   Acute on chronic respiratory failure with hypoxia (HCC) 02/08/2022   Gastrointestinal hemorrhage    Persistent atrial fibrillation (HCC)    Acute exacerbation of CHF (congestive heart failure) ([place]) 01/09/2022   Atrial fibrillation with RVR (HCC)    Acute congestive heart failure (HCC)    Urinary tract infection 12/13/2021   AKI (acute kidney injury) ([place]) 12/13/2021   At risk for sepsis 12/13/2021   Primary osteoarthritis of both knees 03/09/2021   Urinary frequency 03/09/2021   Depression, major, single episode, in partial remission ([place]) 03/09/2021   Edema of lower extremity 08/20/2017   Hyperlipidemia 08/20/2017   CKD stage G3b/A1, GFR 30-44 and albumin creatinine ratio <30 mg/g (HCC) 02/27/2017   Allergic urticaria 08/02/2016   Fungal dermatitis 11/08/2015   Right leg pain 10/14/2015   Goals of care, counseling/discussion 11/09/2014   Dermatophytosis of nail 09/24/2013   Arthritis    Claw toe (acquired) 04/17/2013   Hammer toe, acquired 04/17/2013   Spherocytosis, hereditary ([place]) 08/30/2011   Diabetes mellitus ([place]) 10/17/2010   Obesity, Class III, BMI 40-49.9 (morbid obesity) ([place]) 03/19/2008   Primary hypertension 03/19/2008   ROTATOR CUFF SPRAIN AND STRAIN 03/19/2008   Hyperlipidemia associated with type 2 diabetes mellitus ([place]) 09/05/2007   Depression 09/05/2007   ROSACEA 05/19/2007    Palliative Care Assessment & Plan   Patient Profile: 88 y.o. female  with past medical history of persistent A-fib, chronic diastolic CHF, right ventricular failure, insulin-dependent  type 2 diabetes mellitus, CKD 4, morbid obesity-BMI 51, chronic respiratory failure admitted on 02/12/2022 with worsening dyspnea and hypoxia, as well as worsening leg swelling and ulcers  over the left ankle.   In the ED, patient was in A-fib RVR with heart rate in the 120s, chest x-ray noted CHF. S/p cardioversion on 2/14 with overall poor prognosis. Today Cr is slightly increased from 2.33 -> 2.43 and glucose remains elevated. Palliative medicine has been consulted to assist with goals of care conversation.  Assessment: Goals of care conversation Acute on chronic hypoxic respiratory failure, stable Acute on chronic CHF CKD4  Recommendations/Plan: DNR Patient now desires to discharge home with HH/outpatient palliative care, would then transition to hospice if she continues to decline at home Psychosocial and emotional support provided PMT remains available on an as-needed basis. Please call or secure chat with urgent needs  Prognosis:  Poor prognosis given acute exacerbation of multiple chronic conditions, functional decline, and advanced age  Discharge Planning: Home with Palliative Services   Total time: I spent 35 minutes in the care of the patient today in the above activities and documenting the encounter.   Dorthy Cooler, PA-C Palliative Medicine Team Team phone # [phone]  Thank you for allowing the Palliative Medicine Team to assist in the care of this patient. Please utilize secure chat with additional questions, if there is no response within 30 minutes please call the above phone number.  Palliative Medicine Team providers are available by phone from 7am to 7pm daily and can be reached through the team cell phone.  Should this patient require assistance outside of these hours, please call the patient's attending physician.

## 2022-02-22 NOTE — TOC Progression Note (Addendum)
Transition of Care [facility]) - Progression Note    Patient Details  Name: Anaisha Mago MRN: [id number] Date of Birth: 06/21/1934  [facility]) CM/SW Contact  Zenon Mayo, RN Phone Number: 02/22/2022, 12:24 PM  Clinical Narrative:    From home with daughter, she is active with Alvis Lemmings for HHRN,HHPT, HHAIDE and Social Worker, [place]. She also has home oxygen 4 liters. She is now refusing SNF and wants to go home with [facility].  TOC will continue to follow for dc needs..     Expected Discharge Plan: Skilled Nursing Facility Barriers to Discharge: Continued Medical Work up  Expected Discharge Plan and Services Expected Discharge Plan: [place] In-house Referral: Clinical Social Work Discharge Planning Services: CM Consult Post Acute Care Choice: [place] arrangements for the past 2 months: [place]                   DME Agency: NA       HH Arranged: Therapist, sports, PT, Nurse's Aide, Social Work, OT [place] Agency: [place] Date [place] Agency Contacted: 02/14/22 Time [place]: [id number] Representative spoke with at [place]: [place] ([place]) Interventions    Readmission Risk Interventions Readmission Risk Prevention Plan 02/14/2022 01/26/2022  Transportation Screening Complete Complete  PCP or Specialist Appt within 3-5 Days Complete Complete  HRI or [place] Complete Complete  Social Work Consult for [place] Planning/Counseling Complete Complete  Palliative Care Screening Not Applicable Not Applicable  Medication Review Press photographer) Complete Complete  Some recent data might be hidden

## 2022-02-22 NOTE — Progress Notes (Signed)
Paged ortho to replace unna boots

## 2022-02-22 NOTE — Progress Notes (Signed)
Mobility Specialist Progress Note:   02/22/22 1100  Mobility  Activity Ambulated with assistance in room  Level of Assistance Standby assist, set-up cues, supervision of patient - no hands on  Assistive Device Front wheel walker  Distance Ambulated (ft) 12 ft  Activity Response Tolerated well  $Mobility charge 1 Mobility   Pt eager for OOB mobility. Not requiring any physical assist, except to assist with legs when getting back to bed. SpO2 stayed >90% throughout session on 4LO2. Pt back in bed with all needs met.   Nelta Numbers Acute Rehab Phone: [phone] Office Phone: [phone]

## 2022-02-22 NOTE — TOC Progression Note (Signed)
Transition of Care [facility]) - Progression Note    Patient Details  Name: Katelyn Mahoney MRN: [id number] Date of Birth: December 18, 1934  Transition of Care [facility]) CM/SW Contact  Reece Agar, [place] Phone Number: 02/22/2022, 10:00 AM  Clinical Narrative:    Pt is now refusing SNF and has decided with her daughter that she would go home at [place] is signing off unless there are any further needs.   Expected Discharge Plan: [place] Barriers to Discharge: Continued Medical Work up  Expected Discharge Plan and Services Expected Discharge Plan: [place] In-house Referral: Clinical Social Work Discharge Planning Services: CM Consult Post Acute Care Choice: [place] arrangements for the past 2 months: [place]                   DME Agency: NA       HH Arranged: Therapist, sports, PT, Nurse's Aide, Social Work, OT [place] Agency: [place] Date [place] Agency Contacted: 02/14/22 Time [place]: [id number] Representative spoke with at [place]: [place] ([place]) Interventions    Readmission Risk Interventions Readmission Risk Prevention Plan 02/14/2022 01/26/2022  Transportation Screening Complete Complete  PCP or Specialist Appt within 3-5 Days Complete Complete  HRI or [place] Complete Complete  Social Work Consult for [place] Planning/Counseling Complete Complete  Palliative Care Screening Not Applicable Not Applicable  Medication Review Press photographer) Complete Complete  Some recent data might be hidden

## 2022-02-23 ENCOUNTER — Ambulatory Visit: Payer: Medicare Other | Admitting: *Deleted

## 2022-02-23 ENCOUNTER — Encounter: Payer: Medicare Other | Admitting: Endocrinology

## 2022-02-23 DIAGNOSIS — F324 Major depressive disorder, single episode, in partial remission: Secondary | ICD-10-CM

## 2022-02-23 DIAGNOSIS — N184 Chronic kidney disease, stage 4 (severe): Secondary | ICD-10-CM | POA: Diagnosis not present

## 2022-02-23 DIAGNOSIS — I5031 Acute diastolic (congestive) heart failure: Secondary | ICD-10-CM

## 2022-02-23 DIAGNOSIS — E1169 Type 2 diabetes mellitus with other specified complication: Secondary | ICD-10-CM

## 2022-02-23 DIAGNOSIS — E1122 Type 2 diabetes mellitus with diabetic chronic kidney disease: Secondary | ICD-10-CM

## 2022-02-23 DIAGNOSIS — I4891 Unspecified atrial fibrillation: Secondary | ICD-10-CM

## 2022-02-23 DIAGNOSIS — I509 Heart failure, unspecified: Secondary | ICD-10-CM

## 2022-02-23 DIAGNOSIS — E785 Hyperlipidemia, unspecified: Secondary | ICD-10-CM

## 2022-02-23 DIAGNOSIS — D580 Hereditary spherocytosis: Secondary | ICD-10-CM

## 2022-02-23 DIAGNOSIS — I5033 Acute on chronic diastolic (congestive) heart failure: Secondary | ICD-10-CM | POA: Diagnosis not present

## 2022-02-23 DIAGNOSIS — N1832 Chronic kidney disease, stage 3b: Secondary | ICD-10-CM

## 2022-02-23 DIAGNOSIS — F3289 Other specified depressive episodes: Secondary | ICD-10-CM

## 2022-02-23 DIAGNOSIS — N179 Acute kidney failure, unspecified: Secondary | ICD-10-CM

## 2022-02-23 LAB — BASIC METABOLIC PANEL
Anion gap: 9 (ref 5–15)
BUN: 50 mg/dL — ABNORMAL HIGH (ref 8–23)
CO2: 37 mmol/L — ABNORMAL HIGH (ref 22–32)
Calcium: 9.1 mg/dL (ref 8.9–10.3)
Chloride: 96 mmol/L — ABNORMAL LOW (ref 98–111)
Creatinine, Ser: 2.25 mg/dL — ABNORMAL HIGH (ref 0.44–1.00)
GFR, Estimated: 21 mL/min — ABNORMAL LOW (ref >60)
Glucose, Bld: 200 mg/dL — ABNORMAL HIGH (ref 70–99)
Potassium: 3.3 mmol/L — ABNORMAL LOW (ref 3.5–5.1)
Sodium: 142 mmol/L (ref 135–145)

## 2022-02-23 LAB — GLUCOSE, CAPILLARY
Glucose-Capillary: 174 mg/dL — ABNORMAL HIGH (ref 70–99)
Glucose-Capillary: 213 mg/dL — ABNORMAL HIGH (ref 70–99)
Glucose-Capillary: 215 mg/dL — ABNORMAL HIGH (ref 70–99)
Glucose-Capillary: 200 mg/dL — ABNORMAL HIGH (ref 70–99)

## 2022-02-23 MED ORDER — POTASSIUM CHLORIDE CRYS ER 20 MEQ PO TBCR
40.0000 meq | EXTENDED_RELEASE_TABLET | Freq: Once | ORAL | Status: AC
  Administered 2022-02-23: 40 meq via ORAL
  Filled 2022-02-23: qty 2

## 2022-02-23 NOTE — Progress Notes (Signed)
This encounter was created in error - please disregard.

## 2022-02-23 NOTE — Patient Instructions (Signed)
Visit Information  Thank you for taking time to visit with me today. Please don't hesitate to contact me if I can be of assistance to you before our next scheduled telephone appointment.  Following are the goals we discussed today:  Patient Goals/Self-Care Activities: Continue to reside inpatient until medically stable and ready for discharge home. Continue to receive bi-weekly calls from LCSW, in an effort to resume [place], Physical Therapy, Occupational Therapy, Social Work, and Engineer, manufacturing, through [facility], upon discharge from the hospital. ~ No longer interested in short-term skilled nursing facility placement for rehabilitative services.   Contact LCSW directly (# [phone]), if you have questions, need assistance, or if additional social work needs are identified.   Follow-Up Plan:  LCSW will make contact with patient on 03/12/2022 at 9:45 am.  Please call the care guide team at [phone] if you need to cancel or reschedule your appointment.   If you are experiencing a Mental Health or [place] or need someone to talk to, please call the Suicide and Crisis Lifeline: 988 call the [place] National Suicide Prevention Lifeline: 770-057-9503 or TTY: (972) 809-5486 TTY 603-684-5888) to talk to a trained counselor call 1-800-273-TALK (toll free, 24 hour hotline) go to [facility] [address], [place] [phone]) call the [place]: [phone] call 911   Patient verbalizes understanding of instructions and care plan provided today and agrees to view in [place]. Active MyChart status confirmed with patient.    [place] Licensed Clinical Social Worker [facility] [employer] [phone]

## 2022-02-23 NOTE — Progress Notes (Signed)
Occupational Therapy Treatment Patient Details Name: Katelyn Mahoney MRN: [id number] DOB: 1934/08/06 Today's Date: 02/23/2022   History of present illness 88 y.o. female admitted 02/14/22 with SOB,  weight gain, uncontrolled A-fib with acute on chronic HFpEF exacerbation. S/p DCCV 2/14. PMhx: admit on 01/08/22 for CHF exacerbation. DM II, HTN, obesity, CKD, HLD, chronic LE edema, chronic hypoxic respiratory failure on 4L O2, Afib, HfpEF   OT comments  Patient continues to make progress towards goals in skilled OT session. Patient's session encompassed sit<>stands with marching in place, and grooming tasks in sitting. Patient more fatigued in session, but was able to ambulate more with PT and complete exercises earlier in the morning. Patient needing min cues to check into her body with regard to energy conservation, but good demonstration of pursed lip breathing. No need for increased O2 in session. Patient would continue to benefit from SNF placement; OT will continue to follow acutely.    Recommendations for follow up therapy are one component of a multi-disciplinary discharge planning process, led by the attending physician.  Recommendations may be updated based on patient status, additional functional criteria and insurance authorization.    Follow Up Recommendations  Skilled nursing-short term rehab (<3 hours/day)    Assistance Recommended at Discharge Intermittent Supervision/Assistance  Patient can return home with the following  Help with stairs or ramp for entrance;A little help with bathing/dressing/bathroom;A little help with walking and/or transfers;Assistance with cooking/housework;Direct supervision/assist for financial management;Direct supervision/assist for medications management   Equipment Recommendations  None recommended by OT (defer to next venue)    Recommendations for Other Services      Precautions / Restrictions Precautions Precautions: Fall;Other  (comment) Precaution Comments: 4L O2 at baseline Restrictions Weight Bearing Restrictions: No       Mobility Bed Mobility               General bed mobility comments: up in chair upon arrival    Transfers Overall transfer level: Needs assistance Equipment used: Rolling walker (2 wheels) Transfers: Sit to/from Stand Sit to Stand: Min assist, Min guard           General transfer comment: min guard to rise from recliner x 2 trials     Balance Overall balance assessment: Needs assistance Sitting-balance support: No upper extremity supported, Feet supported Sitting balance-Leahy Scale: Good     Standing balance support: Bilateral upper extremity supported, Reliant on assistive device for balance Standing balance-Leahy Scale: Poor Standing balance comment: walker and min guard for static standing                           ADL either performed or assessed with clinical judgement   ADL Overall ADL's : Needs assistance/impaired     Grooming: Set up;Sitting;Brushing hair Grooming Details (indicate cue type and reason): using shower cap to wash hair                 Toilet Transfer: Min guard;Ambulation;Rolling walker (2 wheels) Toilet Transfer Details (indicate cue type and reason): simulated in room, using RW         Functional mobility during ADLs: Minimal assistance;Rolling walker (2 wheels) General ADL Comments: Patient continuing to progress towards increased activity tolerance    Extremity/Trunk Assessment              Vision       Perception     Praxis      Cognition Arousal/Alertness: Awake/alert Behavior During Therapy: [facility]  for tasks assessed/performed Overall Cognitive Status: Within Functional Limits for tasks assessed                                          Exercises      Shoulder Instructions       General Comments      Pertinent Vitals/ Pain       Pain Assessment Pain Assessment:  Faces Faces Pain Scale: Hurts a little bit Pain Location: generalized with mobility Pain Descriptors / Indicators: Discomfort, Grimacing, Guarding Pain Intervention(s): Limited activity within patient's tolerance, Monitored during session, Repositioned  Home Living                                          Prior Functioning/Environment              Frequency  Min 2X/week        Progress Toward Goals  OT Goals(current goals can now be found in the care plan section)  Progress towards OT goals: Progressing toward goals  Acute Rehab OT Goals Patient Stated Goal: to get stronger OT Goal Formulation: With patient Time For Goal Achievement: 02/28/22 Potential to Achieve Goals: [place] Discharge plan remains appropriate    Co-evaluation                 AM-PAC OT "6 Clicks" Daily Activity     Outcome Measure   Help from another person eating meals?: None Help from another person taking care of personal grooming?: A Little Help from another person toileting, which includes using toliet, bedpan, or urinal?: A Lot Help from another person bathing (including washing, rinsing, drying)?: A Lot Help from another person to put on and taking off regular upper body clothing?: A Little Help from another person to put on and taking off regular lower body clothing?: A Lot 6 Click Score: 16    End of Session Equipment Utilized During Treatment: Rolling walker (2 wheels)  OT Visit Diagnosis: Unsteadiness on feet (R26.81);Other abnormalities of gait and mobility (R26.89);Muscle weakness (generalized) (M62.81)   Activity Tolerance Patient tolerated treatment well;Patient limited by fatigue   Patient Left in chair;with call bell/phone within reach   Nurse Communication Mobility status        Time: 9379-0240 OT Time Calculation (min): 28 min  Charges: OT General Charges $OT Visit: 1 Visit OT Treatments $Self Care/Home Management : 23-37 mins  [place]. [place] Subramaniam, OTR/L Acute Rehabilitation Services [phone] [place] 02/23/2022, 3:13 PM

## 2022-02-23 NOTE — Progress Notes (Signed)
Mobility Specialist Progress Note:   02/23/22 1430  Mobility  Activity Ambulated with assistance in room  Level of Assistance Standby assist, set-up cues, supervision of patient - no hands on  Assistive Device Front wheel walker  Distance Ambulated (ft) 20 ft  Activity Response Tolerated fair  $Mobility charge 1 Mobility   Pt agreeable to mobility session at this time. Required no physical assist throughout. Pt desat to 88% on 4LO2, quickly recovered to 94% with rest.  Pt back in chair with all needs met.   Nelta Numbers Acute Rehab Phone: [phone] Office Phone: [phone]

## 2022-02-23 NOTE — Chronic Care Management (AMB) (Signed)
Chronic Care Management    Clinical Social Work Note  02/23/2022 Name: Katelyn Mahoney MRN: [id number] DOB: [birth year]  Katelyn Mahoney is a 88 y.o. year old female who is a primary care patient of Ann Held, DO. The CCM team was consulted to assist the patient with chronic disease management and/or care coordination needs related to: Level of Care Concerns.   Engaged with patient's daughter by telephone for follow up visit in response to provider referral for social work chronic care management and care coordination services.   Consent to Services:  The patient was given information about Chronic Care Management services, agreed to services, and gave verbal consent prior to initiation of services.  Please see initial visit note for detailed documentation.   Patient agreed to services and consent obtained.   Assessment: Review of patient past medical history, allergies, medications, and health status, including review of relevant consultants reports was performed today as part of a comprehensive evaluation and provision of chronic care management and care coordination services.     SDOH (Social Determinants of Health) assessments and interventions performed:    Advanced Directives Status: Not addressed in this encounter.  CCM Care Plan  Allergies  Allergen Reactions   Bupropion Hcl Rash    Palmar rash   Penicillins Rash    rash   Citalopram Hydrobromide Other (See Comments)    cough   Metformin And Related Diarrhea   Blue Dyes (Parenteral) Rash   [place]alt Rash   Gold-Containing Drug Products Rash   Nickel Rash   Zinc Rash    Facility-Administered Encounter Medications as of 02/23/2022  Medication   0.9 %  sodium chloride infusion   acetaminophen (TYLENOL) tablet 650 mg   acetaZOLAMIDE (DIAMOX) tablet 250 mg   apixaban (ELIQUIS) tablet 2.5 mg   atorvastatin (LIPITOR) tablet 10 mg   atorvastatin (LIPITOR) tablet 20 mg   diltiazem (CARDIZEM CD) 24 hr capsule 120  mg   diphenoxylate-atropine (LOMOTIL) 2.5-0.025 MG/5ML liquid 5 mL   fluocinonide cream (LIDEX) 7.78 % 1 application   furosemide (LASIX) injection 80 mg   gabapentin (NEURONTIN) capsule 100 mg   guaiFENesin (MUCINEX) 12 hr tablet 600 mg   insulin aspart (novoLOG) injection 0-5 Units   insulin aspart (novoLOG) injection 0-9 Units   insulin glargine-yfgn (SEMGLEE) injection 20 Units   iron polysaccharides (NIFEREX) capsule 150 mg   levalbuterol (XOPENEX) nebulizer solution 0.63 mg   levofloxacin (LEVAQUIN) tablet 750 mg   loratadine (CLARITIN) tablet 10 mg   metoprolol tartrate (LOPRESSOR) injection 2.5 mg   metoprolol tartrate (LOPRESSOR) tablet 25 mg   ondansetron (ZOFRAN) injection 4 mg   pantoprazole (PROTONIX) EC tablet 40 mg   sertraline (ZOLOFT) tablet 100 mg   sodium chloride (OCEAN) 0.65 % nasal spray 1 spray   sodium chloride flush (NS) 0.9 % injection 3 mL   sodium chloride flush (NS) 0.9 % injection 3 mL   spironolactone (ALDACTONE) tablet 25 mg   umeclidinium bromide (INCRUSE ELLIPTA) 62.5 MCG/ACT 1 puff   Outpatient Encounter Medications as of 02/23/2022  Medication Sig Note   acetaminophen (TYLENOL) 650 MG CR tablet 650 mg every 8 (eight) hours.    albuterol (VENTOLIN HFA) 108 (90 Base) MCG/ACT inhaler Inhale 2 puffs into the lungs every 6 (six) hours as needed for wheezing or shortness of breath.    apixaban (ELIQUIS) 2.5 MG TABS tablet Take 1 tablet (2.5 mg total) by mouth 2 (two) times daily.    atorvastatin (LIPITOR) 20 MG tablet  TAKE 1/2 TABLET ON MONDAY, WEDNESDAY, FRIDAY AND      SUNDAYS (Patient taking differently: Take 20 mg by mouth 4 (four) times a week. Take 1/2 (10 mg) tablet on Monday, Wednesday, Friday, and Sunday)    Bilberry, Vaccinium myrtillus, (BILBERRY PO) Take 1 tablet by mouth daily.    diltiazem (CARDIZEM CD) 120 MG 24 hr capsule Take 1 capsule (120 mg total) by mouth daily.    fluocinonide cream (LIDEX) 9.67 % Apply 1 application topically daily  as needed (breakouts).    gabapentin (NEURONTIN) 100 MG capsule TAKE 1 CAPSULE BY MOUTH 3 TIMES DAILY (Patient taking differently: Take 100 mg by mouth 3 (three) times daily.)    glipiZIDE (GLUCOTROL XL) 10 MG 24 hr tablet TAKE 1 TABLET EVERY MORNING (Patient not taking: Reported on 02/12/2022)    Glucosamine HCl (GLUCOSAMINE PO) Take 1 tablet by mouth daily.    insulin lispro (HUMALOG KWIKPEN) 100 UNIT/ML KwikPen Inject 6 Units into the skin with breakfast, with lunch, and with evening meal. Inject 7-9 units under the skin three times daily before meals. E11.65 02/12/2022: Pt had 12 units   Insulin Pen Needle 32G X 4 MM MISC Use on insulin pens 4 times a day    ipratropium-albuterol (DUONEB) 0.5-2.5 (3) MG/3ML SOLN Take 3 mLs by nebulization every 6 (six) hours as needed.    iron polysaccharides (NU-IRON) 150 MG capsule Take 1 capsule (150 mg total) by mouth daily.    Loratadine 10 MG CAPS Take 10 mg by mouth daily.    metoprolol tartrate (LOPRESSOR) 25 MG tablet Take 1 tablet (25 mg total) by mouth 2 (two) times daily.    multivitamin-lutein (OCUVITE-LUTEIN) CAPS capsule Take 1 capsule by mouth daily.    NONFORMULARY OR COMPOUNDED ITEM Lift chair  #1   Dx osteoarthritis knees    OneTouch Delica Lancets 89F MISC Use to test blood sugar once daily- E11.65    ONETOUCH VERIO test strip USE TO TEST BLOOD SUGAR    TWICE A DAY    OZEMPIC, 1 MG/DOSE, 4 MG/3ML SOPN INJECT 1MG  SUBCUTANEOUSLY  WEEKLY (EVERY 7 DAYS) (Patient taking differently: Inject 1 mg into the skin every Saturday.)    pantoprazole (PROTONIX) 40 MG tablet Take 1 tablet (40 mg total) by mouth daily.    potassium chloride SA (KLOR-CON M) 20 MEQ tablet Take 2 tablets (40 mEq total) by mouth daily.    sertraline (ZOLOFT) 100 MG tablet TAKE 1 TABLET DAILY (Patient taking differently: Take 100 mg by mouth daily.)    sodium chloride (OCEAN) 0.65 % SOLN nasal spray Place 1 spray into both nostrils as needed for congestion.    Tiotropium Bromide  Monohydrate (SPIRIVA RESPIMAT) 2.5 MCG/ACT AERS Inhale 1 puff into the lungs daily.    torsemide 60 MG TABS Take 60 mg by mouth 2 (two) times daily.    TOUJEO SOLOSTAR 300 UNIT/ML Solostar Pen INJECT 34 UNITS            SUBCUTANEOUSLY DAILY (Patient taking differently: Inject 34 Units into the skin daily.)     Patient Active Problem List   Diagnosis Date Noted   UTI (urinary tract infection) 02/21/2022   Paroxysmal atrial fibrillation (HCC)    Atrial fibrillation status post cardioversion [facility])    Cardiorenal syndrome    Anemia    Pulmonary nodule 02/14/2022   Chronic atrial fibrillation with RVR ([place]) 02/13/2022   Chronic kidney disease (CKD), stage IV (severe) ([employer]) 02/13/2022   CHF (congestive heart failure) ([place])  02/12/2022   Acute on chronic respiratory failure with hypoxia (HCC) 02/08/2022   Gastrointestinal hemorrhage    Persistent atrial fibrillation (HCC)    Acute exacerbation of CHF (congestive heart failure) ([place]) 01/09/2022   Atrial fibrillation with RVR (HCC)    Acute congestive heart failure ([place])    Urinary tract infection 12/13/2021   AKI (acute kidney injury) ([place]) 12/13/2021   At risk for sepsis 12/13/2021   Primary osteoarthritis of both knees 03/09/2021   Urinary frequency 03/09/2021   Depression, major, single episode, in partial remission ([place]) 03/09/2021   Edema of lower extremity 08/20/2017   Hyperlipidemia 08/20/2017   CKD stage G3b/A1, GFR 30-44 and albumin creatinine ratio <30 mg/g (HCC) 02/27/2017   Allergic urticaria 08/02/2016   Fungal dermatitis 11/08/2015   Right leg pain 10/14/2015   Goals of care, counseling/discussion 11/09/2014   Dermatophytosis of nail 09/24/2013   Arthritis    Claw toe (acquired) 04/17/2013   Hammer toe, acquired 04/17/2013   Spherocytosis, hereditary ([place]) 08/30/2011   Type 2 diabetes mellitus with hyperlipidemia ([place]) 10/17/2010   Obesity, Class III, BMI 40-49.9 (morbid obesity) ([place]) 03/19/2008   Primary hypertension  03/19/2008   ROTATOR CUFF SPRAIN AND STRAIN 03/19/2008   Hyperlipidemia associated with type 2 diabetes mellitus ([place]) 09/05/2007   Depression 09/05/2007   ROSACEA 05/19/2007    Conditions to be addressed/monitored: CHF, HTN, and DMII.  Level of Care Concerns, ADL/IADL Limitations, Limited Access to Caregiver, Memory Deficits, and Lacks Knowledge of [employer].  Care Plan : LCSW Plan of Care  Updates made by Francis Gaines, LCSW since 02/23/2022 12:00 AM     Problem: Obtain Skilled Nursing Placement for [employer].   Priority: High     Goal: Obtain Skilled Nursing Placement for [employer].   Start Date: 02/09/2022  Expected End Date: 05/04/2022  This Visit's Progress: On track  Recent Progress: On track  Priority: High  Note:   Current Barriers:  Chronic Disease Management Support, Education, Resources, Referrals, Advocacy, and Care Coordination Needs related to Acute on Chronic Respiratory Failure with Hypoxia, Hypertension, Acute Exacerbation of Congestive Heart Failure, Type II Diabetes Mellitus, Stage III Chronic Kidney Disease, and Major Depression, Single Episode, in Partial Remission. Inability to consistently perform ADL's/IADL's independently. Clinical Goal(s):  Patient will work with LCSW, in an effort to identify and address any acute and chronic care coordination needs related to the self-health management of Acute on Chronic Respiratory Failure with Hypoxia, Hypertension, Acute Exacerbation of Congestive Heart Failure, Type II Diabetes Mellitus, Stage III Chronic Kidney Disease, and Major Depression, Single Episode, in Partial Remission. Interventions: Inter-disciplinary care team collaboration (see longitudinal plan of care). Collaboration with Primary Care Physician, Dr. Roma Schanz regarding development and update of comprehensive plan of care as evidenced by provider attestation and co-signature. LCSW  Interventions:  Collaboration with daughter to confirm patient's desire to return home and receive home health care services, as opposed to receiving short-term skilled nursing facility placement for rehabilitative services.   Patient Goals/Self-Care Activities: Continue to reside inpatient until medically stable and ready for discharge home. Continue to receive bi-weekly calls from LCSW, in an effort to resume [place], Physical Therapy, Occupational Therapy, Social Work, and Engineer, manufacturing, through [facility], upon discharge from the hospital. ~ No longer interested in short-term skilled nursing facility placement for rehabilitative services.   Contact LCSW directly (# [phone]), if you have questions, need assistance, or if additional social work needs are identified.  Follow-Up Plan:  LCSW will make contact with patient on 03/12/2022 at 9:45 am.     [place] Worker [facility] [phone]

## 2022-02-23 NOTE — TOC Progression Note (Signed)
Transition of Care [facility]) - Progression Note    Patient Details  Name: Katelyn Mahoney MRN: [id number] Date of Birth: 06/13/1934  Transition of Care [facility]) CM/SW Contact  Zenon Mayo, RN Phone Number: 02/23/2022, 3:07 PM  Clinical Narrative:    Per Dr. Cathlean Sauer, daughter is on board with patient going to SNF now.   Expected Discharge Plan: [place] Barriers to Discharge: Continued Medical Work up  Expected Discharge Plan and Services Expected Discharge Plan: [place] In-house Referral: Clinical Social Work Discharge Planning Services: CM Consult Post Acute Care Choice: [place] arrangements for the past 2 months: [place]                   DME Agency: NA       HH Arranged: Therapist, sports, PT, Nurse's Aide, Social Work, OT [place] Agency: [place] Date [place] Agency Contacted: 02/14/22 Time [place]: [id number] Representative spoke with at [place]: [place] ([place]) Interventions    Readmission Risk Interventions Readmission Risk Prevention Plan 02/14/2022 01/26/2022  Transportation Screening Complete Complete  PCP or Specialist Appt within 3-5 Days Complete Complete  HRI or [place] Complete Complete  Social Work Consult for [place] Planning/Counseling Complete Complete  Palliative Care Screening Not Applicable Not Applicable  Medication Review Press photographer) Complete Complete  Some recent data might be hidden

## 2022-02-23 NOTE — TOC Progression Note (Signed)
Transition of Care [facility] [place]) - Progression Note    Patient Details  Name: Katelyn Mahoney MRN: [id number] Date of Birth: [birth year]  Transition of Care [facility]) CM/SW [place], [place] Phone Number: 02/23/2022, 3:30 PM  Clinical Narrative:     CSW informed that daughter is interested in SNF now. CSW called pt daughter who confirmed she's interested in SNF. Previously, DC plan was for pt to go to [place] had started [place]. Pt was then not medically ready and plans changed. Now wants SNF again. Daughter states she no longer wants [place] as she heard negative things from friends/family. CSW verbally provided list of SNF offers and medicare star ratings. [facility] is highest rated out of the SNF's that extended bed offers. Daughter will review list with pt this evening. Daughter reports that pt is still ambivalent about SNF but she is hopeful that pt will be agreeable.   CSW met with pt bedside. Provided SNF list. Pt is very ambivalent about SNF and is not very satisfied with the star ratings of the bed offers. CSW is able to explain SNF process further and discusses the decision with pt. She seems somewhat willing to consider SNF. Says she will discuss further with her daughter this evening.   Expected Discharge Plan: [place] Barriers to Discharge: Continued Medical Work up  Expected Discharge Plan and Services Expected Discharge Plan: [place] In-house Referral: Clinical Social Work Discharge Planning Services: CM Consult Post Acute Care Choice: [place] arrangements for the past 2 months: [place]                   DME Agency: NA       HH Arranged: Therapist, sports, PT, Nurse's Aide, Social Work, OT [place] Agency: [place] Date [place] Agency Contacted: 02/14/22 Time [place]: [id number] Representative spoke with at [place]: [place] ([place]) Interventions    Readmission  Risk Interventions Readmission Risk Prevention Plan 02/14/2022 01/26/2022  Transportation Screening Complete Complete  PCP or Specialist Appt within 3-5 Days Complete Complete  HRI or [place] Complete Complete  Social Work Consult for [place] Planning/Counseling Complete Complete  Palliative Care Screening Not Applicable Not Applicable  Medication Review Press photographer) Complete Complete  Some recent data might be hidden

## 2022-02-23 NOTE — Progress Notes (Signed)
Physical Therapy Treatment Patient Details Name: Katelyn Mahoney MRN: [id number] DOB: 01-15-1934 Today's Date: 02/23/2022   History of Present Illness 88 y.o. female admitted 02/14/22 with SOB,  weight gain, uncontrolled A-fib with acute on chronic HFpEF exacerbation. S/p DCCV 2/14. PMhx: admit on 01/08/22 for CHF exacerbation. DM II, HTN, obesity, CKD, HLD, chronic LE edema, chronic hypoxic respiratory failure on 4L O2, Afib, HfpEF    PT Comments    Pt pleasant and slowly progressing with gait tolerance and respiratory function. PT 97% on 4L at rest with 6L for all mobility. On first 2 gait trials pt with desaturation go 82-85% with limited gait but on 3rd trial maintained >90% on 6L with gait. HR max 116. Pt educated for progressive activity and HEp. SNF remains appropriate. Pt also with less than 30 sec recovery in sitting to >90% with activity.   Recommendations for follow up therapy are one component of a multi-disciplinary discharge planning process, led by the attending physician.  Recommendations may be updated based on patient status, additional functional criteria and insurance authorization.  Follow Up Recommendations  Skilled nursing-short term rehab (<3 hours/day)     Assistance Recommended at Discharge Intermittent Supervision/Assistance  Patient can return home with the following A little help with walking and/or transfers;A little help with bathing/dressing/bathroom;Assistance with cooking/housework;Assist for transportation;Help with stairs or ramp for entrance   Equipment Recommendations  None recommended by PT    Recommendations for Other Services       Precautions / Restrictions Precautions Precautions: Fall;Other (comment) Precaution Comments: 4L O2 at baseline     Mobility  Bed Mobility Overal bed mobility: Needs Assistance Bed Mobility: Supine to Sit     Supine to sit: Min assist, HOB elevated     General bed mobility comments: assist with HHA and rail  to elevate trunk from surface, increased time to scoot to EOB    Transfers Overall transfer level: Needs assistance   Transfers: Sit to/from Stand Sit to Stand: Min assist, Min guard           General transfer comment: min assist to rise from bed, minguard to rise from recliner x 3 trials    Ambulation/Gait Ambulation/Gait assistance: Min guard Gait Distance (Feet): 25 Feet Assistive device: Rolling walker (2 wheels) Gait Pattern/deviations: Step-through pattern, Wide base of support, Decreased stride length   Gait velocity interpretation: 1.31 - 2.62 ft/sec, indicative of limited community ambulator   General Gait Details: cues for safety with assist for lines. Pt on 6L for gait with sats 82-95% with pt performing 25' x 3 trials with seated rest between each   Stairs             Wheelchair Mobility    Modified Rankin (Stroke Patients Only)       Balance Overall balance assessment: Needs assistance   Sitting balance-Leahy Scale: Good     Standing balance support: Bilateral upper extremity supported, Reliant on assistive device for balance Standing balance-Leahy Scale: Poor Standing balance comment: walker and min guard for static standing                            Cognition Arousal/Alertness: Awake/alert Behavior During Therapy: WFL for tasks assessed/performed Overall Cognitive Status: Impaired/Different from baseline Area of Impairment: Safety/judgement                         Safety/Judgement: Decreased awareness of deficits  General Comments: pt with wet linens and unaware        Exercises General Exercises - Lower Extremity Long Arc Quad: AROM, Both, Seated, Strengthening, 20 reps Hip Flexion/Marching: AROM, Both, Seated, 15 reps    General Comments        Pertinent Vitals/Pain Pain Assessment Pain Assessment: No/denies pain    Home Living                          Prior Function             PT Goals (current goals can now be found in the care plan section) Progress towards PT goals: Progressing toward goals    Frequency    Min 2X/week      PT Plan Current plan remains appropriate    Co-evaluation              AM-PAC PT "6 Clicks" Mobility   Outcome Measure  Help needed turning from your back to your side while in a flat bed without using bedrails?: A Little Help needed moving from lying on your back to sitting on the side of a flat bed without using bedrails?: A Little Help needed moving to and from a bed to a chair (including a wheelchair)?: A Little Help needed standing up from a chair using your arms (e.g., wheelchair or bedside chair)?: A Little Help needed to walk in hospital room?: A Lot Help needed climbing 3-5 steps with a railing? : Total 6 Click Score: 15    End of Session Equipment Utilized During Treatment: Oxygen Activity Tolerance: Patient tolerated treatment well Patient left: in chair;with call bell/phone within reach Nurse Communication: Mobility status PT Visit Diagnosis: Muscle weakness (generalized) (M62.81);Unsteadiness on feet (R26.81);Other abnormalities of gait and mobility (R26.89);Difficulty in walking, not elsewhere classified (R26.2)     Time: 1324-4010 PT Time Calculation (min) (ACUTE ONLY): 44 min  Charges:  $Gait Training: 8-22 mins $Therapeutic Activity: 23-37 mins                     Chameka Mcmullen P, PT Acute Rehabilitation Services Pager: [phone] Office: [place] 02/23/2022, 9:36 AM

## 2022-02-23 NOTE — Progress Notes (Signed)
Progress Note   Patient: Katelyn Mahoney SAY:[id number] DOB: 11-09-1934 DOA: 02/12/2022     11 DOS: the patient was seen and examined on 02/23/2022   Brief hospital course: Katelyn Mahoney was admitted to the hospital with the working diagnosis acute decompensated heart failure.   88 yo female with the past medical history of diastolic heart failure, CKD stage IV, atrial fibrillation, T2DM, obesity class 3, and chronic anemia. Reported worsening lower extremity edema, with worsening dyspnea on exertion, and oxygen desaturation down to 80%. Positive palpitations, and chest pressure. Symptoms worsening despite increased dose of oral diuretic therapy at home. On the day of her hospitalization she was found in respiratory distress by home nurse and patient was transported to the ED. On her initial physical examination her blood pressure was 125/75, HR 60, RR 21 and 02 saturation 95% on supplemental 02 per [place], lungs with rales bilaterally with increased work of breathing, and accessory muscle use. Heart with S1 and S2 present and tachycardic irregularly irregular with no gallops, abdomen soft and positive lower extremity edema.   Na 139, K 4,0, CL 97, bicarbonate 30, glucose 173, bun 66 and cr 2,6  Bnp 361 Wbc 11,1 hgb 8,0 hct 29,5 plt 507  SARS covid 19 negative   Chest radiograph with bilateral hilar vascular congestion, small left pleural effusion (personally reviewed).   EKG 125 bpm, with left axis deviation, atrial fibrillation with positive PVC and PAC, with no significant ST segment or T wave changes.   Patient was placed on furosemide for diuresis. 02/14 underwent cardioversion, converting to sinus rhythm.  02/19 urinary tract infection.  Patient continue volume overloaded and was offered cardiac catheterization that she has declined. Palliative care services have been consulted.   Continue volume overloaded, despite IV diuresis.   Assessment and Plan: * CHF (congestive heart failure)  (HCC) Echocardiogram from 12/2021 personally reviewed old records with EF LV 50 to 55%, with mild reduction on RV, moderate dilatation of bilateral atriums, moderate tricuspid regurgitation. Unable to determinate PA pressures.   Urine output is 2,500 ml over last 24 hrs. Blood pressure systolic 235 mmHg.   Continue with volume overload, some improvement but not back to her baseline. Plan to continue with  furosemide 80 mg IV q8 hrs, spironolactone and acetazolamide.  She has unna boots in place with good toleration.   Atrial fibrillation with RVR ([place])- (present on admission) Sp cardioversion DC.  On diltiazem and metoprolol for rate control.  Anticoagulation with apixaban Plan to continue with telemetry monitoring and aggressive diuresis.   Chronic kidney disease (CKD), stage IV (severe) ([place])- (present on admission) Hypokalemia  Patient continue hypervolemic.  Plan to continue diuresis with furosemide 80 mg IV q12 hrs Continue with spironolactone and acetazolamide.  Renal function today with serum cr at 2,25 with K at 3,3 and serum bicarbonate at 37.  Add 40 meq Kcl today and continue following renal function and electrolytes.   Follow up renal function and electrolytes in am.   Anemia of chronic renal disease, hgb is 7,8 and hct at 27.3    Type 2 diabetes mellitus with hyperlipidemia (HCC) Continue glucose cover and monitoring with insulin sliding scalae for glucose cover and monitoring.  Fasting glucose this am 200 mg/dl.   Her po intake has improved over last 2 days.   Continue with statin therapy.   Primary hypertension- (present on admission) Patient on  metoprolol and diltiazem. Diuresis with furosemide, acetazolamide and spironolactone.   Obesity, Class III, BMI 40-49.9 (morbid  obesity) ([place])- (present on admission) Calculated BMI is 50.6  UTI (urinary tract infection) Continue antibiotic therapy with renal dose completed therapy with levofloxacin          Subjective: Patient with improvement in her edema, and dyspnea, no chest pain, no nausea or vomiting.   Physical Exam: Vitals:   02/22/22 2000 02/23/22 0500 02/23/22 0921 02/23/22 1147  BP: (!) 113/45 (!) 115/39  (!) 103/46  Pulse: 78 76 (!) 116 86  Resp: 18 18  20   Temp: 98 F (36.7 C) 98 F (36.7 C)  98.1 F (36.7 C)  TempSrc: Oral Oral  Oral  SpO2: 98% 94% 98% 98%  Weight:  132.5 kg    Height:       Neurology awake and alert, continue to be very weak and deconditioned ENT with mild pallor Cardiovascular with S1 and S2 present and rhythmic with no gallops or murmurs No JVD Positive lower extremity edema++ unna boots in place bilaterally Respiratory with scattered rales Abdomen is soft and protuberant.   Data Reviewed:    Family Communication: no family at the bedside. I spoke over the phone with the patient's daughter about patient's  condition, plan of care, prognosis and all questions were addressed.   Disposition: Status is: Inpatient Remains inpatient appropriate because: heart failure management, patient will need SNF at discharge.      Planned Discharge Destination: Skilled nursing facility   Author: Tawni Millers, MD 02/23/2022 1:40 PM  For on call review [email or web address].

## 2022-02-24 DIAGNOSIS — N184 Chronic kidney disease, stage 4 (severe): Secondary | ICD-10-CM | POA: Diagnosis not present

## 2022-02-24 DIAGNOSIS — I5033 Acute on chronic diastolic (congestive) heart failure: Secondary | ICD-10-CM | POA: Diagnosis not present

## 2022-02-24 DIAGNOSIS — I4891 Unspecified atrial fibrillation: Secondary | ICD-10-CM | POA: Diagnosis not present

## 2022-02-24 LAB — GLUCOSE, CAPILLARY
Glucose-Capillary: 186 mg/dL — ABNORMAL HIGH (ref 70–99)
Glucose-Capillary: 217 mg/dL — ABNORMAL HIGH (ref 70–99)
Glucose-Capillary: 227 mg/dL — ABNORMAL HIGH (ref 70–99)
Glucose-Capillary: 196 mg/dL — ABNORMAL HIGH (ref 70–99)

## 2022-02-24 LAB — BASIC METABOLIC PANEL
Anion gap: 10 (ref 5–15)
BUN: 55 mg/dL — ABNORMAL HIGH (ref 8–23)
CO2: 37 mmol/L — ABNORMAL HIGH (ref 22–32)
Calcium: 9.2 mg/dL (ref 8.9–10.3)
Chloride: 92 mmol/L — ABNORMAL LOW (ref 98–111)
Creatinine, Ser: 2.68 mg/dL — ABNORMAL HIGH (ref 0.44–1.00)
GFR, Estimated: 17 mL/min — ABNORMAL LOW (ref >60)
Glucose, Bld: 188 mg/dL — ABNORMAL HIGH (ref 70–99)
Potassium: 3.9 mmol/L (ref 3.5–5.1)
Sodium: 139 mmol/L (ref 135–145)

## 2022-02-24 LAB — MAGNESIUM: Magnesium: 1.9 mg/dL (ref 1.7–2.4)

## 2022-02-24 NOTE — Progress Notes (Signed)
Progress Note   Patient: Katelyn Mahoney WUG:[id number] DOB: 07-Apr-1934 DOA: 02/12/2022     12 DOS: the patient was seen and examined on 02/24/2022   Brief hospital course: [name] was admitted to the hospital with the working diagnosis acute decompensated heart failure.   88 yo female with the past medical history of diastolic heart failure, CKD stage IV, atrial fibrillation, T2DM, obesity class 3, and chronic anemia. Reported worsening lower extremity edema, with worsening dyspnea on exertion, and oxygen desaturation down to 80%. Positive palpitations, and chest pressure. Symptoms worsening despite increased dose of oral diuretic therapy at home. On the day of her hospitalization she was found in respiratory distress by home nurse and patient was transported to the ED. On her initial physical examination her blood pressure was 125/75, HR 60, RR 21 and 02 saturation 95% on supplemental 02 per [place], lungs with rales bilaterally with increased work of breathing, and accessory muscle use. Heart with S1 and S2 present and tachycardic irregularly irregular with no gallops, abdomen soft and positive lower extremity edema.   Na 139, K 4,0, CL 97, bicarbonate 30, glucose 173, bun 66 and cr 2,6  Bnp 361 Wbc 11,1 hgb 8,0 hct 29,5 plt 507  SARS covid 19 negative   Chest radiograph with bilateral hilar vascular congestion, small left pleural effusion (personally reviewed).   EKG 125 bpm, with left axis deviation, atrial fibrillation with positive PVC and PAC, with no significant ST segment or T wave changes.   Patient was placed on furosemide for diuresis. 02/14 underwent cardioversion, converting to sinus rhythm.  02/19 urinary tract infection.  Patient continue volume overloaded and was offered cardiac catheterization that she has declined. Palliative care services have been consulted.   Prolonged hospitalization due to persistent edema, slowly improving. Plan to transfer to SNF when medically  stable.   Assessment and Plan: * CHF (congestive heart failure) (HCC) Echocardiogram from 12/2021 personally reviewed old records with EF LV 50 to 55%, with mild reduction on RV, moderate dilatation of bilateral atriums, moderate tricuspid regurgitation. Unable to determinate PA pressures.   Urine output 1.050 ml over last 24 hrs.  Her edema has been slowly improving, she has unna boots in place. Will plan to transition to oral torsemide in am, for today will hold on IV furosemide. Continue with spironolactone and acetazolamide.  Follow up renal function in am. Patient with poor mobility, plan to transfer to SNF when stable.   Atrial fibrillation with RVR ([place])- (present on admission) Sp cardioversion DC.  Patient continue on sinus rhythm on telemetry monitor, personally reviewed.   Plan to continue rate control with diltiazem and metoprolol. Anticoagulation with apixaban   Chronic kidney disease (CKD), stage IV (severe) ([place])- (present on admission) Hypokalemia  Improvement in her volume status. Today with serum cr up to 2.68 from 2,25 with K at 3,9 and serum bicarbonate at 37.  Continue with spironolactone and acetazolamide, plan to transition to oral torsemide tomorrow. (at home she was taking 60 mg bid of torsemide).  Anemia of chronic renal disease, hgb is 7,8 and hct at 27.3    Type 2 diabetes mellitus with hyperlipidemia (HCC) Fasting glucose this am is 188, plan to continue insulin therapy basal and sliding scale, patient is tolerating po well.   Continue with statin therapy.   Primary hypertension- (present on admission) Blood pressure has been stable, plan to continue with metoprolol and diltiazem. Continue diuretic therapy.   Obesity, Class III, BMI 40-49.9 (morbid obesity) ([place])- (present  on admission) Calculated BMI is 50.6  UTI (urinary tract infection) Continue antibiotic therapy with renal dose completed therapy with levofloxacin          Subjective: patient with improvement in edema and dyspnea, she is on supplemental 02 at home.   Physical Exam: Vitals:   02/24/22 0227 02/24/22 0637 02/24/22 0639 02/24/22 0819  BP:  (!) 135/97 (!) 135/97   Pulse: (!) 103 (!) 52 69 100  Resp: 20 (!) 24 20 20   Temp: 97.7 F (36.5 C)     TempSrc: Oral     SpO2:  99% 98%   Weight:      Height:       Neurology awake and alert ENT with no pallor or icterus Cardiovascular with S1 and S2 present and rhythmic with no gallops or murmurs, no rubs No apparent JVD Positive lower extremity edema + to ++ unna boots in place.  Lungs with no rales or wheezing Abdomen protuberant but soft and non tender  Data Reviewed:    Family Communication: no family at the bedside   Disposition: Status is: Inpatient Remains inpatient appropriate because: heart failure management, pending transfer to SNF      Planned Discharge Destination: Skilled nursing facility     Author: Tawni Millers, MD 02/24/2022 11:08 AM  For on call review [email or web address].

## 2022-02-24 NOTE — TOC Progression Note (Signed)
Transition of Care [facility]) - Progression Note    Patient Details  Name: Katelyn Mahoney MRN: [id number] Date of Birth: 1934-01-10  Transition of Care [facility]) CM/SW [place], [place] Phone Number: [phone] 02/24/2022, 3:48 PM  Clinical Narrative:     CSW spoke with pt's daughter in regards to bed offers. Daughter asked if any of them have a higher rating than [employer]. CSW explained that [facility] had the highest rating. CSW informed daughter that the plan was to follow up with pt as well. She stated that she felt that pt could make her own decision.  CSW met with pt and provided bed offers. Pt stated that she did want to accept the bed offer and [facility]. CSW plans to follow up with facility.  TOC team will continue to assist with discharge planning needs.   Expected Discharge Plan: [place] Barriers to Discharge: Continued Medical Work up  Expected Discharge Plan and Services Expected Discharge Plan: [place] In-house Referral: Clinical Social Work Discharge Planning Services: CM Consult Post Acute Care Choice: [place] arrangements for the past 2 months: [place]                   DME Agency: NA       HH Arranged: Therapist, sports, PT, Nurse's Aide, Social Work, OT [place] Agency: [place] Date [place] Agency Contacted: 02/14/22 Time [place]: 2035 Representative spoke with at [place]: [place] ([place]) Interventions    Readmission Risk Interventions Readmission Risk Prevention Plan 02/14/2022 01/26/2022  Transportation Screening Complete Complete  PCP or Specialist Appt within 3-5 Days Complete Complete  HRI or [place] Complete Complete  Social Work Consult for [place] Planning/Counseling Complete Complete  Palliative Care Screening Not Applicable Not Applicable  Medication Review Press photographer) Complete Complete  Some recent  data might be hidden

## 2022-02-25 DIAGNOSIS — I5033 Acute on chronic diastolic (congestive) heart failure: Secondary | ICD-10-CM | POA: Diagnosis not present

## 2022-02-25 DIAGNOSIS — N184 Chronic kidney disease, stage 4 (severe): Secondary | ICD-10-CM | POA: Diagnosis not present

## 2022-02-25 DIAGNOSIS — I4891 Unspecified atrial fibrillation: Secondary | ICD-10-CM | POA: Diagnosis not present

## 2022-02-25 LAB — GLUCOSE, CAPILLARY
Glucose-Capillary: 250 mg/dL — ABNORMAL HIGH (ref 70–99)
Glucose-Capillary: 306 mg/dL — ABNORMAL HIGH (ref 70–99)
Glucose-Capillary: 181 mg/dL — ABNORMAL HIGH (ref 70–99)
Glucose-Capillary: 196 mg/dL — ABNORMAL HIGH (ref 70–99)

## 2022-02-25 LAB — BASIC METABOLIC PANEL
Anion gap: 9 (ref 5–15)
BUN: 61 mg/dL — ABNORMAL HIGH (ref 8–23)
CO2: 38 mmol/L — ABNORMAL HIGH (ref 22–32)
Calcium: 8.9 mg/dL (ref 8.9–10.3)
Chloride: 90 mmol/L — ABNORMAL LOW (ref 98–111)
Creatinine, Ser: 2.76 mg/dL — ABNORMAL HIGH (ref 0.44–1.00)
GFR, Estimated: 16 mL/min — ABNORMAL LOW (ref >60)
Glucose, Bld: 266 mg/dL — ABNORMAL HIGH (ref 70–99)
Potassium: 3.4 mmol/L — ABNORMAL LOW (ref 3.5–5.1)
Sodium: 137 mmol/L (ref 135–145)

## 2022-02-25 MED ORDER — LACTULOSE 10 GM/15ML PO SOLN
20.0000 g | Freq: Two times a day (BID) | ORAL | Status: DC
  Filled 2022-02-25 (×4): qty 30

## 2022-02-25 MED ORDER — METOPROLOL TARTRATE 12.5 MG HALF TABLET
12.5000 mg | ORAL_TABLET | Freq: Two times a day (BID) | ORAL | Status: DC
  Administered 2022-02-25 – 2022-03-02 (×10): 12.5 mg via ORAL
  Filled 2022-02-25 (×10): qty 1

## 2022-02-25 MED ORDER — POTASSIUM CHLORIDE CRYS ER 20 MEQ PO TBCR
40.0000 meq | EXTENDED_RELEASE_TABLET | Freq: Once | ORAL | Status: AC
  Administered 2022-02-25: 40 meq via ORAL
  Filled 2022-02-25: qty 2

## 2022-02-25 MED ORDER — FUROSEMIDE 10 MG/ML IJ SOLN
80.0000 mg | Freq: Once | INTRAMUSCULAR | Status: AC
  Administered 2022-02-25: 80 mg via INTRAVENOUS
  Filled 2022-02-25: qty 8

## 2022-02-25 NOTE — Progress Notes (Signed)
Progress Note   Patient: Katelyn Mahoney UVO:[id number] DOB: [birth year] DOA: 02/12/2022     13 DOS: the patient was seen and examined on 02/25/2022   Brief hospital course: Katelyn Mahoney was admitted to the hospital with the working diagnosis acute decompensated heart failure.   88 yo female with the past medical history of diastolic heart failure, CKD stage IV, atrial fibrillation, T2DM, obesity class 3, and chronic anemia. Reported worsening lower extremity edema, with worsening dyspnea on exertion, and oxygen desaturation down to 80%. Positive palpitations, and chest pressure. Symptoms worsening despite increased dose of oral diuretic therapy at home. On the day of her hospitalization she was found in respiratory distress by home nurse and patient was transported to the ED. On her initial physical examination her blood pressure was 125/75, HR 60, RR 21 and 02 saturation 95% on supplemental 02 per [employer], lungs with rales bilaterally with increased work of breathing, and accessory muscle use. Heart with S1 and S2 present and tachycardic irregularly irregular with no gallops, abdomen soft and positive lower extremity edema.   Na 139, K 4,0, CL 97, bicarbonate 30, glucose 173, bun 66 and cr 2,6  Bnp 361 Wbc 11,1 hgb 8,0 hct 29,5 plt 507  SARS covid 19 negative   Chest radiograph with bilateral hilar vascular congestion, small left pleural effusion (personally reviewed).   EKG 125 bpm, with left axis deviation, atrial fibrillation with positive PVC and PAC, with no significant ST segment or T wave changes.   Patient was placed on furosemide for diuresis. 02/14 underwent cardioversion, converting to sinus rhythm.  02/19 urinary tract infection.  Patient continue volume overloaded and was offered cardiac catheterization that she has declined. Palliative care services have been consulted.   Prolonged hospitalization due to persistent edema, slowly improving. 02/26 with asterixis and 6 second sinus  pause while sleeping.   Plan to transfer to SNF when medically stable.   Assessment and Plan: * CHF (congestive heart failure) (HCC) Echocardiogram from 12/2021 personally reviewed old records with EF LV 50 to 55%, with mild reduction on RV, moderate dilatation of bilateral atriums, moderate tricuspid regurgitation. Unable to determinate PA pressures.   Urine output 1.000 ml over last 24 hrs.  Her edema has been slowly improving, she has unna boots in place. Continue with spironolactone and acetazolamide.  Will add one dose of IV furosemide 80 IV x1 and follow up renal function in am.  Atrial fibrillation with RVR ([place])- (present on admission) Sp cardioversion DC.  Patient with bradycardia, she had a 5 second pause while sleeping reported per telemetry.  Will decrease dose of metoprolol to 12,5 mg bid and continue telemetry monitoring Add Cpap.  Continue anticoagulation with apixaban   Chronic kidney disease (CKD), stage IV (severe) ([place])- (present on admission) Hypokalemia  Slowly improving edema but not back to euvolemic.  Renal function with serum cr at 2,76 with K at 3,4 and serum bicarbonate at 38, Na is 137.   On spironolactone and acetazolamide. Add 80 mg IV furosemide today and follow up renal function in am.  Continue K correction with KCl.   Anemia of chronic renal disease, hgb is 7,8 and hct at 27.3   Patient with asterixis today, will check ammonia level and will start empiric lactulose.   Type 2 diabetes mellitus with hyperlipidemia ([place])- (present on admission) Uncontrolled hyperglycemia with fasting glucose this am 266.   Continue insulin therapy basal and sliding scale for glucose cover and monitoring   Continue with statin therapy.  Primary hypertension- (present on admission) Will decrease metoprolol to prevent bradycardia,  Blood pressure has been stable with systolic 932 to 355 mmHg.   Obesity, Class III, BMI 40-49.9 (morbid obesity) ([place])- (present  on admission) Calculated BMI is 50.6  UTI (urinary tract infection) Continue antibiotic therapy with renal dose completed therapy with levofloxacin         Subjective: Patient with tremors in both hands, dyspnea and edema stable, poor mobility, no confusion   Physical Exam: Vitals:   02/25/22 0329 02/25/22 0904 02/25/22 0931 02/25/22 1140  BP: (!) 119/49  (!) 103/55 (!) 114/56  Pulse: 96 81 93 94  Resp: 18  20 20   Temp: 97.9 F (36.6 C)  98.7 F (37.1 C) 98.8 F (37.1 C)  TempSrc: Oral  Oral Oral  SpO2: 95%  96% 97%  Weight: 133.8 kg     Height:       Neurology awake and alert, positive asterixis  ENT with no pallor or icterus Cardiovascular with S1 and S2 present and rhythmic with no gallops or murmurs, no rubs No JVD Positive lower extremity edema ++ pitting bilaterally, unna boots in place Respiratory with no wheezing, rales or rhonchi, on anterior auscultation  Abdomen soft and non tender, protuberant   Data Reviewed:    Family Communication: no family at the bedside   Disposition: Status is: Inpatient Remains inpatient appropriate because: heart failure and renal failure, pending transfer to SNF      Planned Discharge Destination: Skilled nursing facility     Author: Tawni Millers, MD 02/25/2022 3:17 PM  For on call review [email or web address].

## 2022-02-25 NOTE — TOC Progression Note (Signed)
Transition of Care [facility]) - Progression Note    Patient Details  Name: Katelyn Mahoney MRN: [id number] Date of Birth: 07/24/1934  Transition of Care [facility]) CM/SW [place], LCSW Phone Number:[phone] 02/25/2022, 11:06 AM  Clinical Narrative:     CSW spoke with Elnita Maxwell from [facility] and confirmed a bed for pt. Per MD possible Tuesday DC and pt will need authorization.  TOC team will continue to assist with discharge planning needs.    Expected Discharge Plan: [place] Barriers to Discharge: Continued Medical Work up  Expected Discharge Plan and Services Expected Discharge Plan: [place] In-house Referral: Clinical Social Work Discharge Planning Services: CM Consult Post Acute Care Choice: [place] arrangements for the past 2 months: [place]                   DME Agency: NA       HH Arranged: Therapist, sports, PT, Nurse's Aide, Social Work, OT [place] Agency: [place] Date [place] Agency Contacted: 02/14/22 Time [place]: [id number] Representative spoke with at [place]: [place] ([place]) Interventions    Readmission Risk Interventions Readmission Risk Prevention Plan 02/14/2022 01/26/2022  Transportation Screening Complete Complete  PCP or Specialist Appt within 3-5 Days Complete Complete  HRI or [place] Complete Complete  Social Work Consult for [place] Planning/Counseling Complete Complete  Palliative Care Screening Not Applicable Not Applicable  Medication Review Press photographer) Complete Complete  Some recent data might be hidden

## 2022-02-25 NOTE — Progress Notes (Signed)
CCMD called and informed RN that patient had a 5.06 second pause. Upon arrival to room, patient was asleep. Told nurse she was "fine". Asked RN to close door on way out. MD notified

## 2022-02-25 NOTE — Plan of Care (Signed)
Problem: Pain Managment: Goal: General experience of comfort will improve Outcome: Completed/Met

## 2022-02-25 NOTE — Progress Notes (Signed)
CPAP set up. Started at 8.0 cm H20. Patient wanted to increase pressure. Increased to 10.0 cmH20. Patient asked to remove mask as she is not able to tolerate. Placed back on 4L [place].

## 2022-02-26 DIAGNOSIS — N184 Chronic kidney disease, stage 4 (severe): Secondary | ICD-10-CM | POA: Diagnosis not present

## 2022-02-26 DIAGNOSIS — K7682 Hepatic encephalopathy: Secondary | ICD-10-CM

## 2022-02-26 DIAGNOSIS — I5033 Acute on chronic diastolic (congestive) heart failure: Secondary | ICD-10-CM | POA: Diagnosis not present

## 2022-02-26 DIAGNOSIS — I4891 Unspecified atrial fibrillation: Secondary | ICD-10-CM | POA: Diagnosis not present

## 2022-02-26 LAB — GLUCOSE, CAPILLARY
Glucose-Capillary: 212 mg/dL — ABNORMAL HIGH (ref 70–99)
Glucose-Capillary: 232 mg/dL — ABNORMAL HIGH (ref 70–99)
Glucose-Capillary: 211 mg/dL — ABNORMAL HIGH (ref 70–99)
Glucose-Capillary: 267 mg/dL — ABNORMAL HIGH (ref 70–99)

## 2022-02-26 LAB — COMPREHENSIVE METABOLIC PANEL
ALT: 12 U/L (ref 0–44)
AST: 20 U/L (ref 15–41)
Albumin: 2.8 g/dL — ABNORMAL LOW (ref 3.5–5.0)
Alkaline Phosphatase: 53 U/L (ref 38–126)
Anion gap: 8 (ref 5–15)
BUN: 67 mg/dL — ABNORMAL HIGH (ref 8–23)
CO2: 38 mmol/L — ABNORMAL HIGH (ref 22–32)
Calcium: 8.5 mg/dL — ABNORMAL LOW (ref 8.9–10.3)
Chloride: 91 mmol/L — ABNORMAL LOW (ref 98–111)
Creatinine, Ser: 2.76 mg/dL — ABNORMAL HIGH (ref 0.44–1.00)
GFR, Estimated: 16 mL/min — ABNORMAL LOW (ref >60)
Glucose, Bld: 211 mg/dL — ABNORMAL HIGH (ref 70–99)
Potassium: 3.1 mmol/L — ABNORMAL LOW (ref 3.5–5.1)
Sodium: 137 mmol/L (ref 135–145)
Total Bilirubin: 0.4 mg/dL (ref 0.3–1.2)
Total Protein: 5.7 g/dL — ABNORMAL LOW (ref 6.5–8.1)

## 2022-02-26 LAB — AMMONIA: Ammonia: 59 umol/L — ABNORMAL HIGH (ref 9–35)

## 2022-02-26 MED ORDER — FUROSEMIDE 10 MG/ML IJ SOLN
80.0000 mg | Freq: Once | INTRAMUSCULAR | Status: AC
  Administered 2022-02-26: 80 mg via INTRAVENOUS
  Filled 2022-02-26: qty 8

## 2022-02-26 MED ORDER — POTASSIUM CHLORIDE CRYS ER 20 MEQ PO TBCR
40.0000 meq | EXTENDED_RELEASE_TABLET | ORAL | Status: AC
  Administered 2022-02-26 (×2): 40 meq via ORAL
  Filled 2022-02-26 (×2): qty 2

## 2022-02-26 NOTE — Plan of Care (Signed)
Problem: Elimination: Goal: Will not experience complications related to urinary retention Outcome: Completed/Met   Problem: Elimination: Goal: Will not experience complications related to bowel motility Outcome: Completed/Met   Problem: Clinical Measurements: Goal: Will remain free from infection Outcome: Completed/Met

## 2022-02-26 NOTE — TOC Progression Note (Signed)
Transition of Care [facility]) - Progression Note    Patient Details  Name: Katelyn Mahoney MRN: [id number] Date of Birth: 1934-10-28  Transition of Care [facility]) CM/SW Contact  Reece Agar, [place] Phone Number: 02/26/2022, 4:48 PM  Clinical Narrative:    CSW messages Star at [facility] about pt DC tomorrow, they will need to start auth. Star will contact CSW back after getting insurance information from business office. CSW will continue to follow for DC planning.    Expected Discharge Plan: [place] Barriers to Discharge: Continued Medical Work up  Expected Discharge Plan and Services Expected Discharge Plan: [place] In-house Referral: Clinical Social Work Discharge Planning Services: CM Consult Post Acute Care Choice: [place] arrangements for the past 2 months: [place]                   DME Agency: NA       HH Arranged: Therapist, sports, PT, Nurse's Aide, Social Work, OT [place] Agency: [place] Date [place] Agency Contacted: 02/14/22 Time [place] [address]: 0037 Representative spoke with at [place]: [place] ([place]) Interventions    Readmission Risk Interventions Readmission Risk Prevention Plan 02/14/2022 01/26/2022  Transportation Screening Complete Complete  PCP or Specialist Appt within 3-5 Days Complete Complete  HRI or [place] Complete Complete  Social Work Consult for [place] Planning/Counseling Complete Complete  Palliative Care Screening Not Applicable Not Applicable  Medication Review Press photographer) Complete Complete  Some recent data might be hidden

## 2022-02-26 NOTE — Plan of Care (Signed)
?  Problem: Education: ?Goal: Ability to demonstrate management of disease process will improve ?Outcome: Progressing ?  ?Problem: Activity: ?Goal: Capacity to carry out activities will improve ?Outcome: Progressing ?  ?Problem: Cardiac: ?Goal: Ability to achieve and maintain adequate cardiopulmonary perfusion will improve ?Outcome: Progressing ?  ?

## 2022-02-26 NOTE — Discharge Instructions (Signed)

## 2022-02-26 NOTE — Progress Notes (Signed)
Patient refused CPAP at this time. Continues on 4L [place]. Equipment on standy-by if needed.

## 2022-02-26 NOTE — Progress Notes (Signed)
Mobility Specialist Progress Note:   02/26/22 1030  Mobility  Activity Transferred to/from [facility]  Level of Assistance Standby assist, set-up cues, supervision of patient - no hands on  Assistive Device Front wheel walker  Distance Ambulated (ft) 4 ft  Activity Response Tolerated well  $Mobility charge 1 Mobility   Pt with successful BM on BSC. Required total A for pericare. Transferred to chair with no physical assistance, pt left sitting up in chair with all needs met.   Nelta Numbers Acute Rehab Phone: [phone] Office Phone: [phone]

## 2022-02-26 NOTE — Progress Notes (Signed)
Mobility Specialist Progress Note:   02/26/22 1000  Mobility  Activity Transferred to/from [facility]  Level of Assistance Minimal assist, patient does 75% or more  Assistive Device Front wheel walker  Distance Ambulated (ft) 2 ft  Activity Response Tolerated fair  $Mobility charge 1 Mobility   Pt agreeable to mobility session at this time, although fatigued. Requested to get on [facility]. Required minA with use of chuck pads to get EOB, supervision for transfer however required multiple attempts to stand with elevated bed. Pt left on BSC with call bell in reach. Will f/u to get in chair.   Nelta Numbers Acute Rehab Phone: [phone] Office Phone: [phone]

## 2022-02-26 NOTE — Care Management Important Message (Signed)
Important Message  Patient Details  Name: Katelyn Mahoney MRN: [id number] Date of Birth: 1934/07/13   Medicare Important Message Given:  Yes     Shelda Altes 02/26/2022, 9:32 AM

## 2022-02-26 NOTE — Progress Notes (Addendum)
Physical Therapy Treatment Patient Details Name: Katelyn Mahoney MRN: [id number] DOB: 04/15/1934 Today's Date: 02/26/2022   History of Present Illness 88 y.o. female admitted 02/14/22 with SOB,  weight gain, uncontrolled A-fib with acute on chronic HFpEF exacerbation. S/p DCCV 2/14. PMhx: admit on 01/08/22 for CHF exacerbation. DM II, HTN, obesity, CKD, HLD, chronic LE edema, chronic hypoxic respiratory failure on 4L O2, Afib, HfpEF    PT Comments    The pt was agreeable to session, continues to present with decreased endurance and activity tolerance. She was able to complete multiple short bouts of ambulation in the room, self-limited to ~12 ft due to fatigue but was able to continue with progressively shorter seated rest breaks at this time. Will continue to benefit from skilled PT acutely and following d/c. Continue to recommend SNF placement for continued rehab prior to return home.   SpO2 in 90s on 4L O2 with activity HR 100-120s    Recommendations for follow up therapy are one component of a multi-disciplinary discharge planning process, led by the attending physician.  Recommendations may be updated based on patient status, additional functional criteria and insurance authorization.  Follow Up Recommendations  Skilled nursing-short term rehab (<3 hours/day)     Assistance Recommended at Discharge Intermittent Supervision/Assistance  Patient can return home with the following A little help with walking and/or transfers;A little help with bathing/dressing/bathroom;Assistance with cooking/housework;Assist for transportation;Help with stairs or ramp for entrance   Equipment Recommendations  None recommended by PT    Recommendations for Other Services       Precautions / Restrictions Precautions Precautions: Fall;Other (comment) Precaution Comments: 4L O2 at baseline Restrictions Weight Bearing Restrictions: No     Mobility  Bed Mobility Overal bed mobility: Needs Assistance              General bed mobility comments: up in chair upon arrival    Transfers Overall transfer level: Needs assistance Equipment used: Rolling walker (2 wheels) Transfers: Sit to/from Stand Sit to Stand: Min assist           General transfer comment: minA to power up and steady as pt moves UE from chair to rw.    Ambulation/Gait Ambulation/Gait assistance: Min guard Gait Distance (Feet): 5 Feet (+ 5 ft + 12 ft x4) Assistive device: Rolling walker (2 wheels) Gait Pattern/deviations: Step-through pattern, Wide base of support, Decreased stride length             Balance Overall balance assessment: Needs assistance Sitting-balance support: No upper extremity supported, Feet supported Sitting balance-Leahy Scale: Good Sitting balance - Comments: supervision   Standing balance support: Bilateral upper extremity supported, Reliant on assistive device for balance Standing balance-Leahy Scale: Poor Standing balance comment: walker and min guard for static standing                            Cognition Arousal/Alertness: Awake/alert Behavior During Therapy: WFL for tasks assessed/performed Overall Cognitive Status: Impaired/Different from baseline Area of Impairment: Awareness, Safety/judgement                         Safety/Judgement: Decreased awareness of deficits Awareness: Intellectual   General Comments: pt incontinent of urine with mobility and unaware        Exercises      General Comments General comments (skin integrity, edema, etc.): VSS on 4L      Pertinent Vitals/Pain Pain Assessment Pain Assessment: No/denies  pain Pain Intervention(s): Monitored during session     PT Goals (current goals can now be found in the care plan section) Acute Rehab PT Goals Patient Stated Goal: to go home PT Goal Formulation: With patient Time For Goal Achievement: 02/28/22 Potential to Achieve Goals: Good Progress towards PT goals:  Progressing toward goals    Frequency    Min 2X/week      PT Plan Current plan remains appropriate       AM-PAC PT "6 Clicks" Mobility   Outcome Measure  Help needed turning from your back to your side while in a flat bed without using bedrails?: A Little Help needed moving from lying on your back to sitting on the side of a flat bed without using bedrails?: A Little Help needed moving to and from a bed to a chair (including a wheelchair)?: A Little Help needed standing up from a chair using your arms (e.g., wheelchair or bedside chair)?: A Little Help needed to walk in hospital room?: A Lot Help needed climbing 3-5 steps with a railing? : Total 6 Click Score: 15    End of Session Equipment Utilized During Treatment: Oxygen;Gait belt Activity Tolerance: Patient tolerated treatment well Patient left: in chair;with call bell/phone within reach Nurse Communication: Mobility status PT Visit Diagnosis: Muscle weakness (generalized) (M62.81);Unsteadiness on feet (R26.81);Other abnormalities of gait and mobility (R26.89);Difficulty in walking, not elsewhere classified (R26.2)     Time: 1421-1500 PT Time Calculation (min) (ACUTE ONLY): 39 min  Charges:  $Therapeutic Exercise: 23-37 mins $Therapeutic Activity: 8-22 mins                     [place], PT, DPT   Acute Rehabilitation Department Pager #: [phone]   Sandra Cockayne 02/26/2022, 3:05 PM

## 2022-02-26 NOTE — Assessment & Plan Note (Addendum)
Congested hepatopathy.   Patient with more than 3 bowel movements per day per her report.  Asterixis has improved.  Liver US with no acute changes.   Will decrease lactulose to 20 ml daily.  Continue close monitoring and aggressive diuresis.

## 2022-02-26 NOTE — Progress Notes (Addendum)
Progress Note   Patient: Katelyn Mahoney VWU:[id number] DOB: [birth year] DOA: 02/12/2022     14 DOS: the patient was seen and examined on 02/26/2022   Brief hospital course: Katelyn Mahoney was admitted to the hospital with the working diagnosis acute decompensated heart failure.   88 yo female with the past medical history of diastolic heart failure, CKD stage IV, atrial fibrillation, T2DM, obesity class 3, and chronic anemia. Reported worsening lower extremity edema, with worsening dyspnea on exertion, and oxygen desaturation down to 80%. Positive palpitations, and chest pressure. Symptoms worsening despite increased dose of oral diuretic therapy at home. On the day of her hospitalization she was found in respiratory distress by home nurse and patient was transported to the ED. On her initial physical examination her blood pressure was 125/75, HR 60, RR 21 and 02 saturation 95% on supplemental 02 per [place], lungs with rales bilaterally with increased work of breathing, and accessory muscle use. Heart with S1 and S2 present and tachycardic irregularly irregular with no gallops, abdomen soft and positive lower extremity edema.   Na 139, K 4,0, CL 97, bicarbonate 30, glucose 173, bun 66 and cr 2,6  Bnp 361 Wbc 11,1 hgb 8,0 hct 29,5 plt 507  SARS covid 19 negative   Chest radiograph with bilateral hilar vascular congestion, small left pleural effusion (personally reviewed).   EKG 125 bpm, with left axis deviation, atrial fibrillation with positive PVC and PAC, with no significant ST segment or T wave changes.   Patient was placed on furosemide for diuresis. 02/14 underwent cardioversion, converting to sinus rhythm.  02/19 urinary tract infection.  Patient continue volume overloaded and was offered cardiac catheterization that she has declined. Palliative care services have been consulted.   Prolonged hospitalization due to persistent edema, slowly improving. 02/26 with asterixis and 6 second sinus  pause while sleeping.   Plan to transfer to SNF when medically stable.   Assessment and Plan: * CHF (congestive heart failure) (HCC) Echocardiogram from 12/2021 personally reviewed old records with EF LV 50 to 55%, with mild reduction on RV, moderate dilatation of bilateral atriums, moderate tricuspid regurgitation. Unable to determinate PA pressures.   Urine output 300 ml over last 24 hrs. Blood pressure 295 mmHg systolic.   Plan to repeat dose of furosemide 80 mg IV today and continue with spironolactone and acetazolamide. Holding on ARB or ARNI due to risk of worsening hypotension and renal failure.     Atrial fibrillation with RVR ([place])- (present on admission) Sp cardioversion DC.  Patient with bradycardia, she had a 5 second pause while sleeping reported per telemetry 02/25/22/   Continue with metoprolol (reduced dose), diltiazem,  Cpap and  anticoagulation with apixaban. Continue with telemetry monitoring.    Chronic kidney disease (CKD), stage IV (severe) ([place])- (present on admission) Hypokalemia  Slowly improving edema but not back to euvolemic.    Renal function today with serum cr at 2,76 with K at 3,1 and serum bicarbonate at 38.  Continue with spironolactone and acetazolamide. Furosemide 80 mg x1   Continue K correction with KCl.   Anemia of chronic renal disease, hgb is 7,8 and hct at 27.3. Follow up cell count in am.  Type 2 diabetes mellitus with hyperlipidemia ([place])- (present on admission) Uncontrolled hyperglycemia with fasting glucose this am 266.   Continue insulin therapy basal and sliding scale for glucose cover and monitoring   Continue with statin therapy.   Primary hypertension- (present on admission) Will decrease metoprolol to prevent bradycardia,  Blood pressure has been stable with systolic 034 to 742 mmHg.   Acute hepatic encephalopathy Patient with asterixis, ammonia elevated at 59. Old records personally reviewed, noted 2002 abdominal  CT with normal appearing liver.  Plan to get liver US and liver enzymes Continue with bid lactulose.  Neuro checks per unit protocol.  Possible congested hepatopathy.     UTI (urinary tract infection) Continue antibiotic therapy with renal dose completed therapy with levofloxacin   Obesity, Class III, BMI 40-49.9 (morbid obesity) ([place])- (present on admission) Calculated BMI is 50.6        Subjective: Patient is feeling better, she is out of bed to the chair, continue to have asterixis but improved, positive bowel movements x2 per day/   Physical Exam: Vitals:   02/25/22 1549 02/25/22 1932 02/26/22 0342 02/26/22 1110  BP: 120/85 (!) 121/55 105/66 (!) 100/43  Pulse: 88 98 (!) 52 (!) 57  Resp: (!) 21 20 19 18   Temp:  98.4 F (36.9 C) 98.5 F (36.9 C) 98.7 F (37.1 C)  TempSrc:  Oral Oral Oral  SpO2: 97% 98% 99% 94%  Weight:   132.7 kg   Height:       Neurology awake and alert, follows commands and non focal, no confusion., positive asterixis improved from yesterday ENT with pallor but not icterus  Cardiovascular with S1 and S2 present and rhythmic with no gallops or murmurs No JVD Positive lower extremity edema + pitting. Unna boots in place bilaterally Respiratory with no wheezing or rhonchi, no rales Abdomen protuberant but not tender or distended   Data Reviewed:    Family Communication: no family at the bedside   Disposition: Status is: Inpatient Remains inpatient appropriate because: heart failure, pending SNF     Planned Discharge Destination: Skilled nursing facility      Author: Tawni Millers, MD 02/26/2022 12:56 PM  For on call review [email or web address].

## 2022-02-27 ENCOUNTER — Inpatient Hospital Stay (HOSPITAL_COMMUNITY): Payer: Medicare Other

## 2022-02-27 DIAGNOSIS — N184 Chronic kidney disease, stage 4 (severe): Secondary | ICD-10-CM | POA: Diagnosis not present

## 2022-02-27 DIAGNOSIS — I509 Heart failure, unspecified: Secondary | ICD-10-CM | POA: Diagnosis not present

## 2022-02-27 DIAGNOSIS — F324 Major depressive disorder, single episode, in partial remission: Secondary | ICD-10-CM

## 2022-02-27 DIAGNOSIS — F3289 Other specified depressive episodes: Secondary | ICD-10-CM | POA: Diagnosis not present

## 2022-02-27 DIAGNOSIS — D580 Hereditary spherocytosis: Secondary | ICD-10-CM

## 2022-02-27 DIAGNOSIS — I4891 Unspecified atrial fibrillation: Secondary | ICD-10-CM | POA: Diagnosis not present

## 2022-02-27 DIAGNOSIS — K7682 Hepatic encephalopathy: Secondary | ICD-10-CM | POA: Diagnosis not present

## 2022-02-27 DIAGNOSIS — N179 Acute kidney failure, unspecified: Secondary | ICD-10-CM

## 2022-02-27 DIAGNOSIS — I5033 Acute on chronic diastolic (congestive) heart failure: Secondary | ICD-10-CM | POA: Diagnosis not present

## 2022-02-27 DIAGNOSIS — E785 Hyperlipidemia, unspecified: Secondary | ICD-10-CM

## 2022-02-27 DIAGNOSIS — E1122 Type 2 diabetes mellitus with diabetic chronic kidney disease: Secondary | ICD-10-CM

## 2022-02-27 DIAGNOSIS — I5031 Acute diastolic (congestive) heart failure: Secondary | ICD-10-CM

## 2022-02-27 DIAGNOSIS — E1169 Type 2 diabetes mellitus with other specified complication: Secondary | ICD-10-CM

## 2022-02-27 DIAGNOSIS — N1832 Chronic kidney disease, stage 3b: Secondary | ICD-10-CM

## 2022-02-27 LAB — CBC
HCT: 30.3 % — ABNORMAL LOW (ref 36.0–46.0)
Hemoglobin: 8.6 g/dL — ABNORMAL LOW (ref 12.0–15.0)
MCH: 29 pg (ref 26.0–34.0)
MCHC: 28.4 g/dL — ABNORMAL LOW (ref 30.0–36.0)
MCV: 102 fL — ABNORMAL HIGH (ref 80.0–100.0)
Platelets: 293 10*3/uL (ref 150–400)
RBC: 2.97 MIL/uL — ABNORMAL LOW (ref 3.87–5.11)
RDW: 19.2 % — ABNORMAL HIGH (ref 11.5–15.5)
WBC: 16.8 10*3/uL — ABNORMAL HIGH (ref 4.0–10.5)
nRBC: 0.2 % (ref 0.0–0.2)

## 2022-02-27 LAB — COMPREHENSIVE METABOLIC PANEL
ALT: 14 U/L (ref 0–44)
AST: 23 U/L (ref 15–41)
Albumin: 3.1 g/dL — ABNORMAL LOW (ref 3.5–5.0)
Alkaline Phosphatase: 60 U/L (ref 38–126)
Anion gap: 12 (ref 5–15)
BUN: 68 mg/dL — ABNORMAL HIGH (ref 8–23)
CO2: 36 mmol/L — ABNORMAL HIGH (ref 22–32)
Calcium: 8.8 mg/dL — ABNORMAL LOW (ref 8.9–10.3)
Chloride: 90 mmol/L — ABNORMAL LOW (ref 98–111)
Creatinine, Ser: 2.8 mg/dL — ABNORMAL HIGH (ref 0.44–1.00)
GFR, Estimated: 16 mL/min — ABNORMAL LOW (ref >60)
Glucose, Bld: 179 mg/dL — ABNORMAL HIGH (ref 70–99)
Potassium: 3.4 mmol/L — ABNORMAL LOW (ref 3.5–5.1)
Sodium: 138 mmol/L (ref 135–145)
Total Bilirubin: 0.6 mg/dL (ref 0.3–1.2)
Total Protein: 6.3 g/dL — ABNORMAL LOW (ref 6.5–8.1)

## 2022-02-27 LAB — GLUCOSE, CAPILLARY
Glucose-Capillary: 192 mg/dL — ABNORMAL HIGH (ref 70–99)
Glucose-Capillary: 254 mg/dL — ABNORMAL HIGH (ref 70–99)
Glucose-Capillary: 259 mg/dL — ABNORMAL HIGH (ref 70–99)
Glucose-Capillary: 240 mg/dL — ABNORMAL HIGH (ref 70–99)

## 2022-02-27 LAB — MAGNESIUM: Magnesium: 1.9 mg/dL (ref 1.7–2.4)

## 2022-02-27 MED ORDER — TORSEMIDE 20 MG PO TABS: 80.0000 mg | ORAL_TABLET | Freq: Two times a day (BID) | ORAL | Status: DC

## 2022-02-27 MED ORDER — ATORVASTATIN CALCIUM 10 MG PO TABS
10.0000 mg | ORAL_TABLET | Freq: Every day | ORAL | Status: DC
Start: 2022-02-28 — End: 2022-03-02
  Administered 2022-02-28 – 2022-03-02 (×3): 10 mg via ORAL
  Filled 2022-02-27 (×3): qty 1

## 2022-02-27 MED ORDER — TORSEMIDE 20 MG PO TABS
60.0000 mg | ORAL_TABLET | Freq: Two times a day (BID) | ORAL | Status: DC
  Administered 2022-02-27 – 2022-03-01 (×5): 60 mg via ORAL
  Filled 2022-02-27 (×6): qty 3

## 2022-02-27 MED ORDER — LACTULOSE 10 GM/15ML PO SOLN
20.0000 g | Freq: Every day | ORAL | Status: DC
  Filled 2022-02-27 (×2): qty 30

## 2022-02-27 MED ORDER — POTASSIUM CHLORIDE CRYS ER 20 MEQ PO TBCR
40.0000 meq | EXTENDED_RELEASE_TABLET | Freq: Once | ORAL | Status: AC
  Administered 2022-02-27: 40 meq via ORAL
  Filled 2022-02-27: qty 2

## 2022-02-27 NOTE — Progress Notes (Signed)
Occupational Therapy Treatment Patient Details Name: Katelyn Mahoney MRN: [id number] DOB: Feb 05, 1934 Today's Date: 02/27/2022   History of present illness 88 y.o. female admitted 02/14/22 with SOB,  weight gain, uncontrolled A-fib with acute on chronic HFpEF exacerbation. S/p DCCV 2/14. PMhx: admit on 01/08/22 for CHF exacerbation. DM II, HTN, obesity, CKD, HLD, chronic LE edema, chronic hypoxic respiratory failure on 4L O2, Afib, HfpEF   OT comments  Patient more confused and fatigued in session to date, stating it was 1923, decreased STM with regards to mobility already working with her and sitting in BM though she claimed to have called. Patient also with episodes of bradycardia down to the 40s while therapist was in room. Patient able to engage in leg exercises and sit<>stands with marching in place after one prolonged bout of standing in order to complete peri-care. Goals updated at end of session, therapy will continue to follow.    Recommendations for follow up therapy are one component of a multi-disciplinary discharge planning process, led by the attending physician.  Recommendations may be updated based on patient status, additional functional criteria and insurance authorization.    Follow Up Recommendations  Skilled nursing-short term rehab (<3 hours/day)    Assistance Recommended at Discharge Intermittent Supervision/Assistance  Patient can return home with the following  Help with stairs or ramp for entrance;A little help with bathing/dressing/bathroom;A little help with walking and/or transfers;Assistance with cooking/housework;Direct supervision/assist for financial management;Direct supervision/assist for medications management   Equipment Recommendations  None recommended by OT (defer to next venue)    Recommendations for Other Services      Precautions / Restrictions Precautions Precautions: Fall;Other (comment) Precaution Comments: 4L O2 at baseline Restrictions Weight  Bearing Restrictions: No       Mobility Bed Mobility               General bed mobility comments: up in chair upon arrival    Transfers Overall transfer level: Needs assistance Equipment used: Rolling walker (2 wheels) Transfers: Sit to/from Stand Sit to Stand: Min assist           General transfer comment: minA to power up and steady as pt moves UE from chair to rw.     Balance Overall balance assessment: Needs assistance Sitting-balance support: No upper extremity supported, Feet supported Sitting balance-Leahy Scale: Good Sitting balance - Comments: supervision   Standing balance support: Bilateral upper extremity supported, Reliant on assistive device for balance Standing balance-Leahy Scale: Poor Standing balance comment: walker and min guard for static standing                           ADL either performed or assessed with clinical judgement   ADL Overall ADL's : Needs assistance/impaired                         Toilet Transfer: Min guard;Ambulation;Rolling walker (2 wheels) Toilet Transfer Details (indicate cue type and reason): simulated in room, using RW Toileting- Clothing Manipulation and Hygiene: Sit to/from stand;Maximal assistance Toileting - Clothing Manipulation Details (indicate cue type and reason): sitting in BM when therapist entered, requiring max A in order to complete thoroughly     Functional mobility during ADLs: Minimal assistance;Rolling walker (2 wheels) General ADL Comments: Patient with brady's down to 40s in session, decreased congition, and increased fatigue    Extremity/Trunk Assessment              Vision  Perception     Praxis      Cognition Arousal/Alertness: Awake/alert Behavior During Therapy: WFL for tasks assessed/performed Overall Cognitive Status: Impaired/Different from baseline Area of Impairment: Awareness, Safety/judgement, Orientation, Memory                  Orientation Level: Time   Memory: Decreased short-term memory   Safety/Judgement: Decreased awareness of deficits Awareness: Intellectual   General Comments: pt incontinent and states she called for the RN or NT, neither were aware, patient also stating it was 1923, and then not rememebering that the mobility specialist had seen her earlier in the morning        Exercises      Shoulder Instructions       General Comments      Pertinent Vitals/ Pain       Pain Assessment Pain Assessment: Faces Faces Pain Scale: Hurts a little bit Pain Location: generalized with mobility Pain Descriptors / Indicators: Discomfort, Grimacing, Guarding Pain Intervention(s): Limited activity within patient's tolerance, Monitored during session  Home Living                                          Prior Functioning/Environment              Frequency  Min 2X/week        Progress Toward Goals  OT Goals(current goals can now be found in the care plan section)  Progress towards OT goals: Progressing toward goals  Acute Rehab OT Goals Patient Stated Goal: to get stronger Time For Goal Achievement: 03/14/22 Potential to Achieve Goals: [place] Discharge plan remains appropriate    Co-evaluation                 AM-PAC OT "6 Clicks" Daily Activity     Outcome Measure   Help from another person eating meals?: None Help from another person taking care of personal grooming?: A Little Help from another person toileting, which includes using toliet, bedpan, or urinal?: A Lot Help from another person bathing (including washing, rinsing, drying)?: A Lot Help from another person to put on and taking off regular upper body clothing?: A Little Help from another person to put on and taking off regular lower body clothing?: A Lot 6 Click Score: 16    End of Session Equipment Utilized During Treatment: Rolling walker (2 wheels)  OT Visit Diagnosis: Unsteadiness  on feet (R26.81);Other abnormalities of gait and mobility (R26.89);Muscle weakness (generalized) (M62.81)   Activity Tolerance Patient limited by fatigue;Patient tolerated treatment well   Patient Left in chair;with call bell/phone within reach   Nurse Communication Mobility status;Other (comment) (Need for purewick, increased confusion)        Time: 1405-1440 OT Time Calculation (min): 35 min  Charges: OT General Charges $OT Visit: 1 Visit OT Treatments $Self Care/Home Management : 8-22 mins $Therapeutic Activity: 8-22 mins  Corinne Ports E. Allexus Ovens, OTR/L Acute Rehabilitation Services [phone] [place] 02/27/2022, 3:09 PM

## 2022-02-27 NOTE — Progress Notes (Signed)
Progress Note   Patient: Katelyn Mahoney QPY:[id number] DOB: 07-Oct-1934 DOA: 02/12/2022     15 DOS: the patient was seen and examined on 02/27/2022   Brief hospital course: Katelyn Mahoney was admitted to the hospital with the working diagnosis acute decompensated heart failure.  Prolonged hospitalization due to slow diuresis related to acute kidney injury on chronic kidney disease.   88 yo female with the past medical history of diastolic heart failure, CKD stage IV, atrial fibrillation, T2DM, obesity class 3, and chronic anemia. Reported worsening lower extremity edema, with worsening dyspnea on exertion, and oxygen desaturation down to 80%. Positive palpitations, and chest pressure. Symptoms worsening despite increased dose of oral diuretic therapy at home. On the day of her hospitalization she was found in respiratory distress by home nurse and patient was transported to the ED. On her initial physical examination her blood pressure was 125/75, HR 60, RR 21 and 02 saturation 95% on supplemental 02 per [place], lungs with rales bilaterally with increased work of breathing, and accessory muscle use. Heart with S1 and S2 present and tachycardic irregularly irregular with no gallops, abdomen soft and positive lower extremity edema.   Na 139, K 4,0, CL 97, bicarbonate 30, glucose 173, bun 66 and cr 2,6  Bnp 361 Wbc 11,1 hgb 8,0 hct 29,5 plt 507  SARS covid 19 negative   Chest radiograph with bilateral hilar vascular congestion, small left pleural effusion (personally reviewed).   EKG 125 bpm, with left axis deviation, atrial fibrillation with positive PVC and PAC, with no significant ST segment or T wave changes.   Patient was placed on furosemide for diuresis. 02/14 underwent cardioversion, converting to sinus rhythm.  02/19 urinary tract infection treated with antibiotic therapy.  Patient continue volume overloaded and was offered cardiac catheterization that she has declined. Palliative care  services have been consulted.   Prolonged hospitalization due to persistent edema, slowly improving. 02/26 with asterixis and 6 second sinus pause while sleeping.   Patient was placed on lactulose with improvement of asterixis, likely liver failure due to congestive hepatopathy.  02/28 transitioned to oral torsemide.   Pending transfer to SNF.    Assessment and Plan: * CHF (congestive heart failure) (HCC) Echocardiogram from 12/2021 personally reviewed old records with EF LV 50 to 55%, with mild reduction on RV, moderate dilatation of bilateral atriums, moderate tricuspid regurgitation. Unable to determinate PA pressures.   Urine output documented only 400 ml over last 24 hrs. Blood pressure 124 mmHg systolic.   Her volume status has improved, she has no dyspnea and able to move better her legs.  Plan to transition to oral torsemide and continue with spironolactone and acetazolamide.  Holding on ARB or ARNI due to risk of worsening hypotension and renal failure.     Atrial fibrillation with RVR ([place])- (present on admission) Sp cardioversion DC.  Patient with bradycardia, she had a 5 second pause while sleeping reported per telemetry 02/25/22/   Patient tolerating well metoprolol and diltiazem. Telemetry personally reviewed, HR in the 90, with positive PVC Continue with Cpap Anticoagulation with apixaban. Continue with telemetry monitoring.    Chronic kidney disease (CKD), stage IV (severe) ([place])- (present on admission) Hypokalemia  Slowly improving edema but not back to euvolemic.    Continue diuresis with torsemide, spironolactone and acetazolamide. Renal function with serum cr at 2,80 with K at 3.4 and serum bicarbonate at 36, Mg 1,9.   Add 40 Kcl today.  Follow up renal function in am.   Anemia  of chronic renal disease, follow up hgb is 8,6 and hct 30.   Type 2 diabetes mellitus with hyperlipidemia ([place])- (present on admission) Uncontrolled hyperglycemia. Patient  is tolerating po well, her fasting glucose today is 179.  Continue insulin therapy with 20 units basal and sliding scale for glucose cover and monitoring   On statin therapy.   Primary hypertension- (present on admission) Continue with metoprolol and diltiazem, aggressive diuresis. Continue close blood pressure monitoring.   Acute hepatic encephalopathy Congested hepatopathy.   Patient with more than 3 bowel movements per day per her report.  Asterixis has improved.  Liver US with no acute changes.   Will decrease lactulose to 20 ml daily.  Continue close monitoring and aggressive diuresis.      UTI (urinary tract infection) Continue antibiotic therapy with renal dose completed therapy with levofloxacin   Obesity, Class III, BMI 40-49.9 (morbid obesity) ([place])- (present on admission) Calculated BMI is 50.6        Subjective: Patient is feeling better with improved dyspnea and edema, positive bowel movements more than 3 per day.   Physical Exam: Vitals:   02/26/22 2037 02/27/22 0448 02/27/22 0830 02/27/22 1130  BP: 114/65 (!) 104/49  (!) 105/49  Pulse: (!) 50 69  (!) 50  Resp: 18 20  20   Temp: 98 F (36.7 C) 98 F (36.7 C)  98.3 F (36.8 C)  TempSrc: Oral Oral  Oral  SpO2: 93% 97% 95% 97%  Weight:  132.2 kg    Height:       Neurology with improved asterixis, no confusion and non focal ENT with no pallor or icterus Cardiovascular with S1 and S2 present and rhythmic with no gallops or murmurs, no rubs No JVD Positive lower extremity edema +-++, unna boots in place bilaterally Respiratory with no wheezing or rales Abdomen soft and non tender Hypertrophic bilateral knees.   Data Reviewed:    Family Communication: no family at the beside   Disposition: Status is: Inpatient Remains inpatient appropriate because: pending transfer to SNF      Planned Discharge Destination: Skilled nursing facility     Author: Tawni Millers, MD 02/27/2022  1:34 PM  For on call review [email or web address].

## 2022-02-27 NOTE — Progress Notes (Signed)
Patient refused CPAP for HS use. 

## 2022-02-27 NOTE — Progress Notes (Signed)
Mobility Specialist Progress Note:   02/27/22 1000  Mobility  Activity Ambulated with assistance in room  Level of Assistance Standby assist, set-up cues, supervision of patient - no hands on  Assistive Device Front wheel walker  Distance Ambulated (ft) 20 ft  Activity Response Tolerated fair  $Mobility charge 1 Mobility   Pt received on BSC, willing to ambulate short distance in room. Distance limited secondary to fatigue, SpO2 >90% on 4LO2 throughout. Pt left in chair with all needs met.   Nelta Numbers Acute Rehab Phone: [phone] Office Phone: [phone]

## 2022-02-27 NOTE — TOC Progression Note (Signed)
Transition of Care [facility]) - Progression Note    Patient Details  Name: Katelyn Mahoney MRN: [id number] Date of Birth: [birth year]  Transition of [facility]) CM/SW Contact  Reece Agar, [place] Phone Number: 02/27/2022, 4:10 PM  Clinical Narrative:    Star at [facility] started auth yesterday but has not heard any thing about the insurance plan. CSW provided a insurance number, and Star will follow up with CSW if she hears anything back.   Expected Discharge Plan: [place] Barriers to Discharge: Continued Medical Work up  Expected Discharge Plan and Services Expected Discharge Plan: [place] In-house Referral: Clinical Social Work Discharge Planning Services: CM Consult Post Acute Care Choice: [place] arrangements for the past 2 months: [place]                   DME Agency: NA       HH Arranged: Therapist, sports, PT, Nurse's Aide, Social Work, OT [place] Agency: [place] Date [place] Agency Contacted: 02/14/22 Time [place]: [id number] Representative spoke with at [place]: [place] ([place]) Interventions    Readmission Risk Interventions Readmission Risk Prevention Plan 02/14/2022 01/26/2022  Transportation Screening Complete Complete  PCP or Specialist Appt within 3-5 Days Complete Complete  HRI or [place] Complete Complete  Social Work Consult for [place] Planning/Counseling Complete Complete  Palliative Care Screening Not Applicable Not Applicable  Medication Review Press photographer) Complete Complete  Some recent data might be hidden

## 2022-02-27 NOTE — Progress Notes (Signed)
Mobility Specialist Progress Note:   02/27/22 0945  Mobility  Activity Transferred to/from [facility]  Level of Assistance Minimal assist, patient does 75% or more  Assistive Device Front wheel walker  Distance Ambulated (ft) 3 ft  Activity Response Tolerated fair  $Mobility charge 1 Mobility   Pre Mobility: SpO2 99 3LO2  During Mobility: SpO2 92% 4LO2  Pt eager for mobility this am. Pt requesting to go to [facility] upon standing d/t diarrhea which she had all night. Pt left on BSC with call bell, will f/u to get pt up in chair.    Nelta Numbers Acute Rehab Phone: [phone] Office Phone: [phone]

## 2022-02-28 DIAGNOSIS — I5033 Acute on chronic diastolic (congestive) heart failure: Secondary | ICD-10-CM | POA: Diagnosis not present

## 2022-02-28 LAB — BASIC METABOLIC PANEL
Anion gap: 8 (ref 5–15)
BUN: 67 mg/dL — ABNORMAL HIGH (ref 8–23)
CO2: 36 mmol/L — ABNORMAL HIGH (ref 22–32)
Calcium: 8.6 mg/dL — ABNORMAL LOW (ref 8.9–10.3)
Chloride: 94 mmol/L — ABNORMAL LOW (ref 98–111)
Creatinine, Ser: 2.71 mg/dL — ABNORMAL HIGH (ref 0.44–1.00)
GFR, Estimated: 17 mL/min — ABNORMAL LOW (ref >60)
Glucose, Bld: 186 mg/dL — ABNORMAL HIGH (ref 70–99)
Potassium: 3.3 mmol/L — ABNORMAL LOW (ref 3.5–5.1)
Sodium: 138 mmol/L (ref 135–145)

## 2022-02-28 LAB — GLUCOSE, CAPILLARY
Glucose-Capillary: 170 mg/dL — ABNORMAL HIGH (ref 70–99)
Glucose-Capillary: 282 mg/dL — ABNORMAL HIGH (ref 70–99)
Glucose-Capillary: 233 mg/dL — ABNORMAL HIGH (ref 70–99)
Glucose-Capillary: 247 mg/dL — ABNORMAL HIGH (ref 70–99)

## 2022-02-28 MED ORDER — POTASSIUM CHLORIDE CRYS ER 20 MEQ PO TBCR
40.0000 meq | EXTENDED_RELEASE_TABLET | Freq: Two times a day (BID) | ORAL | Status: AC
  Administered 2022-02-28 (×2): 40 meq via ORAL
  Filled 2022-02-28 (×2): qty 2

## 2022-02-28 NOTE — Progress Notes (Signed)
Mobility Specialist Progress Note: ? ? 02/28/22 1000  ?Mobility  ?Activity Transferred from bed to chair  ?Level of Assistance Contact guard assist, steadying assist  ?Assistive Device Front wheel walker  ?Distance Ambulated (ft) 3 ft  ?Activity Response Tolerated poorly  ?$Mobility charge 1 Mobility  ? ?Pt agreeable to mobility this am. Requiring x3 attempts to stand from elevated EOB. Pt desat to mid-80s on 4LO2 upon sitting EOB, requiring 6LO2 to stay >90%. Pt declined further mobility stating she "cant do much today". Pt left sitting in chair with all needs met.  ? ?Katelyn Mahoney ?Acute Rehab ?Phone: [phone] ?Office Phone: [phone] ? ?

## 2022-02-28 NOTE — Progress Notes (Signed)
Physical Therapy Treatment ?Patient Details ?Name: Katelyn Mahoney ?MRN: [id number] ?DOB: Aug 17, 1934 ?Today's Date: 02/28/2022 ? ? ?History of Present Illness 88 y.o. female admitted 02/14/22 with SOB,  weight gain, uncontrolled A-fib with acute on chronic HFpEF exacerbation. S/p DCCV 2/14. PMhx: admit on 01/08/22 for CHF exacerbation. DM II, HTN, obesity, CKD, HLD, chronic LE edema, chronic hypoxic respiratory failure on 4L O2, Afib, HfpEF ? ?  ?PT Comments  ? ? Pt progressing slowly towards her physical therapy goals; remains motivated to participate. Session focused on seated exercises and progressive mobility. Pt ambulating 10 ft x 2 with a walker; required 6L O2 to maintain O2 > 90%. Continue to recommend SNF for ongoing Physical Therapy.   ?   ?Recommendations for follow up therapy are one component of a multi-disciplinary discharge planning process, led by the attending physician.  Recommendations may be updated based on patient status, additional functional criteria and insurance authorization. ? ?Follow Up Recommendations ? Skilled nursing-short term rehab (<3 hours/day) ?  ?  ?Assistance Recommended at Discharge Intermittent Supervision/Assistance  ?Patient can return home with the following A little help with walking and/or transfers;A little help with bathing/dressing/bathroom;Assistance with cooking/housework;Assist for transportation;Help with stairs or ramp for entrance ?  ?Equipment Recommendations ? None recommended by PT  ?  ?Recommendations for Other Services   ? ? ?  ?Precautions / Restrictions Precautions ?Precautions: Fall;Other (comment) ?Precaution Comments: 4L O2 at baseline ?Restrictions ?Weight Bearing Restrictions: No  ?  ? ?Mobility ? Bed Mobility ?Overal bed mobility: Needs Assistance ?  ?  ?  ?  ?  ?  ?General bed mobility comments: up in chair upon arrival ?  ? ?Transfers ?Overall transfer level: Needs assistance ?Equipment used: Rolling walker (2 wheels) ?Transfers: Sit to/from Stand ?Sit  to Stand: Min assist ?  ?  ?  ?  ?  ?General transfer comment: minA to power up and steady. initiating well and rocks forward to gain momentum ?  ? ?Ambulation/Gait ?Ambulation/Gait assistance: Min guard ?Gait Distance (Feet): 20 Feet (10 ft x 2) ?Assistive device: Rolling walker (2 wheels) ?Gait Pattern/deviations: Step-through pattern, Wide base of support, Decreased stride length ?Gait velocity: decreased ?Gait velocity interpretation: <1.8 ft/sec, indicate of risk for recurrent falls ?  ?General Gait Details: min guard for safety, fatigues easily and chair follow utilized ? ? ?Stairs ?  ?  ?  ?  ?  ? ? ?Wheelchair Mobility ?  ? ?Modified Rankin (Stroke Patients Only) ?  ? ? ?  ?Balance Overall balance assessment: Needs assistance ?Sitting-balance support: No upper extremity supported, Feet supported ?Sitting balance-Leahy Scale: Good ?Sitting balance - Comments: supervision ?  ?Standing balance support: Bilateral upper extremity supported, Reliant on assistive device for balance ?Standing balance-Leahy Scale: Poor ?Standing balance comment: walker and min guard for static standing ?  ?  ?  ?  ?  ?  ?  ?  ?  ?  ?  ?  ? ?  ?Cognition Arousal/Alertness: Awake/alert ?Behavior During Therapy: [facility] for tasks assessed/performed ?Overall Cognitive Status: Impaired/Different from baseline ?Area of Impairment: Awareness, Safety/judgement ?  ?  ?  ?  ?  ?  ?  ?  ?  ?  ?  ?  ?Safety/Judgement: Decreased awareness of deficits ?Awareness: Emergent ?  ?  ?  ?  ? ?  ?Exercises General Exercises - Lower Extremity ?Ankle Circles/Pumps: Both, 20 reps, Seated ?Quad Sets: Both, 10 reps, Seated ?Long Arc Quad: Both, 10 reps, Seated ?Hip Flexion/Marching: Both, 5  reps, Seated ? ?  ?General Comments   ?  ?  ? ?Pertinent Vitals/Pain Pain Assessment ?Pain Assessment: No/denies pain  ? ? ?Home Living   ?  ?  ?  ?  ?  ?  ?  ?  ?  ?   ?  ?Prior Function    ?  ?  ?   ? ?PT Goals (current goals can now be found in the care plan section) Acute  Rehab PT Goals ?Patient Stated Goal: to go home ?PT Goal Formulation: With patient ?Time For Goal Achievement: 03/14/22 ?Potential to Achieve Goals: Good ? ?  ?Frequency ? ? ? Min 2X/week ? ? ? ?  ?PT Plan Current plan remains appropriate  ? ? ?Co-evaluation   ?  ?  ?  ?  ? ?  ?AM-PAC PT "6 Clicks" Mobility   ?Outcome Measure ? Help needed turning from your back to your side while in a flat bed without using bedrails?: A Little ?Help needed moving from lying on your back to sitting on the side of a flat bed without using bedrails?: A Little ?Help needed moving to and from a bed to a chair (including a wheelchair)?: A Little ?Help needed standing up from a chair using your arms (e.g., wheelchair or bedside chair)?: A Little ?Help needed to walk in hospital room?: A Lot ?Help needed climbing 3-5 steps with a railing? : Total ?6 Click Score: 15 ? ?  ?End of Session Equipment Utilized During Treatment: Oxygen;Gait belt ?Activity Tolerance: Patient tolerated treatment well ?Patient left: in chair;with call bell/phone within reach ?Nurse Communication: Mobility status ?PT Visit Diagnosis: Muscle weakness (generalized) (M62.81);Unsteadiness on feet (R26.81);Other abnormalities of gait and mobility (R26.89);Difficulty in walking, not elsewhere classified (R26.2) ?  ? ? ?Time: 5947-0761 ?PT Time Calculation (min) (ACUTE ONLY): 28 min ? ?Charges:  $Therapeutic Exercise: 8-22 mins ?$Therapeutic Activity: 8-22 mins          ?          ?Wyona Almas, PT, DPT ?Acute Rehabilitation Services ?Pager [phone] ?Office [phone] ? ? ? ?Deno Etienne ?02/28/2022, 2:24 PM ? ?

## 2022-02-28 NOTE — Progress Notes (Signed)
Pt continues to refuse CPAP.  Removed machine from her room. ?

## 2022-02-28 NOTE — Progress Notes (Signed)
PROGRESS NOTE    Katelyn Mahoney  YTK:[id number] DOB: 24-Jul-1934 DOA: 02/12/2022 PCP: Ann Held, DO  Narrative [name] chronically ill with history of persistent A-fib, chronic diastolic CHF, right ventricular failure, type 2 diabetes mellitus, CKD 4, morbid obesity-BMI 32, chronic respiratory failure / COPD (long history of secondhand exposure -on 4 L home O2 for 2 to 3 months ) was brought to the ED with worsening dyspnea and hypoxia, in the ED she was in A-fib RVR with heart rate in the 120s, chest x-ray noted CHF -Admitted, started on diuretics, underwent cardioversion on 2/14 -Has been on high-dose diuretics, with suboptimal response-has CKD 4 -then developed UTi, Rx w/ Abx -Had poor response to diuretics, cardiology discussed right heart cath with patient 2/20, patient declined this, wants conservative management -Palliative care consulted, s/p goals of care meeting, conservative management, goal for SNF, transition to hospice when declines further -subsequently developed hepatic encephalopathy, asterixis improving on lactulose -Plan for SNF soon w/ Palliative care FU  Subjective: feels ok overall, SNF Auth pending, breathing -Stable  Assessment & Plan  Acute on chronic hypoxic respiratory failure  Acute on chronic diastolic CHF -Multifactorial respiratory failure secondary to CHF-worsened by A-fib, obesity,? COPD (heavy 2ndary exposure to smoking-has been on 4L O2 for 2+months -Cardioverted to sinus rhythm on 2/14 -Output remains inaccurate, weight trended up now slowly improving -Has responded poorly to diuretics, despite high dose IV lasix, albumin, metolazone etc -Some component of her edema is likely from venous insufficiency,  UNNA boots used  -Dr. Virgina Jock discussed right heart cath 2/20 -she declined this, wanted conservative management only, overall prognosis is poor in the setting of chronic respiratory failure, CHF, CKD 4, morbid obesity, debility, poor response  to diuretics etc. palliative care consulted, called and updated niece and daughter last week -now on torsemide -Goal is for short-term rehab, ToC following-auth pending -transition to hospice when declines further  Atrial fibrillation with RVR -Cardioverted to sinus rhythm 2/14, continue metoprolol and diltiazem, in sinus rhythm now -Continue apixaban  CKD (chronic kidney disease), stage IV ([place])- (present on admission) -Baseline creatinine around 2 -Creatinine trended up to 2.8, now stable in 2.6-2.7 range -Suspect cardiorenal, continue diuretics as above, BMP in a.m.  Acute hepatic encephalopathy Congested hepatopathy.   -Asterixis has improved, Liver US with no acute changes.  -continue lactulose, dose decreased   Acute on chronic anemia -Baseline hemoglobin around 8, anemia panel in January with severe iron deficiency -Hemoglobin down to 7.8 and stable, given 4 doses of IV iron -Patient denies any overt bleeding,  had an EGD and colonoscopy in January 01, 2022 which were unrevealing -CBC in a.m  UTI -Urine culture with pansensitive E. Coli and Enterobacter -Discussed with Pharm.D., Enterobacter is resistant to cephalosporins, treated w/ levaquin x 3days  Obesity, Class III, BMI 40-49.9 (morbid obesity) ([place])- (present on admission) Calculated BMI is 50.6  Diabetes mellitus ([place]) -stable, continue semglee, continue meal coverage  DVT prophylaxis: Apixaban Code Status: DNR Family Communication: No family at bedside, updated niece and daughter last week Disposition Plan: SNF when bed available  Consultants:  Cardiology  Procedures: DC cardioversion 2/14  Antimicrobials:    Objective: Vitals:   02/28/22 0404 02/28/22 0508 02/28/22 0739 02/28/22 1112  BP: (!) 97/59 (!) 116/50 131/74   Pulse: (!) 51  (!) 109   Resp: 20 20 (!) 21   Temp: 98.7 F (37.1 C)     TempSrc: Oral     SpO2: 97% 100% 93% 100%  Weight:  132 kg     Height:        Intake/Output Summary  (Last 24 hours) at 02/28/2022 1345 Last data filed at 02/28/2022 1228 Gross per 24 hour  Intake 700 ml  Output 400 ml  Net 300 ml   Filed Weights   02/26/22 0342 02/27/22 0448 02/28/22 0404  Weight: 132.7 kg 132.2 kg 132 kg    Examination:  General exam: Morbidly obese chronically ill female sitting up in bed, AAOx3, no distress HEENT: Positive JVD CVS: S1-S2, regular rate rhythm Lungs: Poor air movement, decreased breath sounds bases Abdomen: Soft, nontender, bowel sounds present Extremities: Trace edema  Psychiatry: Judgement and insight appear normal. Mood & affect appropriate.     Data Reviewed:   CBC: Recent Labs  Lab 02/27/22 0311  WBC 16.8*  HGB 8.6*  HCT 30.3*  MCV 102.0*  PLT 124   Basic Metabolic Panel: Recent Labs  Lab 02/22/22 0408 02/23/22 0442 02/24/22 0306 02/25/22 0258 02/26/22 0720 02/27/22 0311 02/28/22 0310  NA 141   < > 139 137 137 138 138  K 3.6   < > 3.9 3.4* 3.1* 3.4* 3.3*  CL 96*   < > 92* 90* 91* 90* 94*  CO2 36*   < > 37* 38* 38* 36* 36*  GLUCOSE 178*   < > 188* 266* 211* 179* 186*  BUN 50*   < > 55* 61* 67* 68* 67*  CREATININE 2.48*   < > 2.68* 2.76* 2.76* 2.80* 2.71*  CALCIUM 9.2   < > 9.2 8.9 8.5* 8.8* 8.6*  MG 2.0  --  1.9  --   --  1.9  --    < > = values in this interval not displayed.   GFR: Estimated Creatinine Clearance: 20.5 mL/min (A) (by C-G formula based on SCr of 2.71 mg/dL (H)). Liver Function Tests: Recent Labs  Lab 02/26/22 0720 02/27/22 0311  AST 20 23  ALT 12 14  ALKPHOS 53 60  BILITOT 0.4 0.6  PROT 5.7* 6.3*  ALBUMIN 2.8* 3.1*   No results for input(s): LIPASE, AMYLASE in the last 168 hours. Recent Labs  Lab 02/26/22 0720  AMMONIA 59*   Coagulation Profile: No results for input(s): INR, PROTIME in the last 168 hours.  Cardiac Enzymes: No results for input(s): CKTOTAL, CKMB, CKMBINDEX, TROPONINI in the last 168 hours. BNP (last 3 results) No results for input(s): PROBNP in the last 8760  hours. HbA1C: No results for input(s): HGBA1C in the last 72 hours. CBG: Recent Labs  Lab 02/27/22 1127 02/27/22 1604 02/27/22 2104 02/28/22 0618 02/28/22 1057  GLUCAP 254* 259* 240* 170* 282*   Lipid Profile: No results for input(s): CHOL, HDL, LDLCALC, TRIG, CHOLHDL, LDLDIRECT in the last 72 hours. Thyroid Function Tests: No results for input(s): TSH, T4TOTAL, FREET4, T3FREE, THYROIDAB in the last 72 hours. Anemia Panel: No results for input(s): VITAMINB12, FOLATE, FERRITIN, TIBC, IRON, RETICCTPCT in the last 72 hours.  Urine analysis:    Component Value Date/Time   COLORURINE YELLOW 02/17/2022 1824   APPEARANCEUR HAZY (A) 02/17/2022 1824   LABSPEC 1.012 02/17/2022 1824   PHURINE 5.0 02/17/2022 1824   GLUCOSEU NEGATIVE 02/17/2022 1824   HGBUR SMALL (A) 02/17/2022 1824   BILIRUBINUR NEGATIVE 02/17/2022 1824   BILIRUBINUR Negative 03/09/2021 0852   KETONESUR NEGATIVE 02/17/2022 1824   PROTEINUR 30 (A) 02/17/2022 1824   UROBILINOGEN 0.2 03/09/2021 0852   UROBILINOGEN 0.2 03/10/2009 0801   NITRITE NEGATIVE 02/17/2022 1824   LEUKOCYTESUR LARGE (A)  02/17/2022 1824   Sepsis Labs: @LABRCNTIP (procalcitonin:4,lacticidven:4)  ) No results found for this or any previous visit (from the past 240 hour(s)).    Radiology Studies: US Abdomen Limited RUQ (LIVER/GB)  Result Date: 02/27/2022 CLINICAL DATA:  Encephalopathy EXAM: ULTRASOUND ABDOMEN LIMITED RIGHT UPPER QUADRANT COMPARISON:  None. FINDINGS: Gallbladder: Prior cholecystectomy. Common bile duct: Diameter: 6 mm Liver: No focal lesion identified. Within normal limits in parenchymal echogenicity. Portal vein is patent on color Doppler imaging with normal direction of blood flow towards the liver. Other: None. IMPRESSION: 1. Prior cholecystectomy. 2. No acute right upper quadrant abnormality. Electronically Signed   By: Kathreen Devoid M.D.   On: 02/27/2022 08:49     Scheduled Meds:  acetaZOLAMIDE  250 mg Oral BID   apixaban   2.5 mg Oral BID   atorvastatin  10 mg Oral Daily   diltiazem  120 mg Oral Daily   gabapentin  100 mg Oral BID   insulin aspart  0-5 Units Subcutaneous QHS   insulin aspart  0-9 Units Subcutaneous TID WC   insulin glargine-yfgn  20 Units Subcutaneous Daily   iron polysaccharides  150 mg Oral Daily   lactulose  20 g Oral Daily   metoprolol tartrate  12.5 mg Oral BID   pantoprazole  40 mg Oral Daily   potassium chloride  40 mEq Oral BID   sertraline  100 mg Oral Daily   sodium chloride flush  3 mL Intravenous Q12H   torsemide  60 mg Oral BID   umeclidinium bromide  1 puff Inhalation Daily   Continuous Infusions:  sodium chloride       LOS: 16 days    Time spent: 16min  Domenic Polite, MD [facility]   02/28/2022, 1:45 PM

## 2022-02-28 NOTE — Progress Notes (Signed)
Inpatient Diabetes Program Recommendations ? ?AACE/ADA: New Consensus Statement on Inpatient Glycemic Control (2015) ? ?Target Ranges:  Prepandial:   less than 140 mg/dL ?     Peak postprandial:   less than 180 mg/dL (1-2 hours) ?     Critically ill patients:  140 - 180 mg/dL  ? ? Latest Reference Range & Units 02/27/22 06:36 02/27/22 11:27 02/27/22 16:04 02/27/22 21:04  ?Glucose-Capillary 70 - 99 mg/dL 192 (H) ? ?2 units Novolog ? ?20 units Semglee @0953  ? 254 (H) ? ?5 units Novolog ? 259 (H) ? ?5 units Novolog ? 240 (H) ? ?2 units Novolog ?  ?(H): Data is abnormally high ? Latest Reference Range & Units 02/28/22 06:18  ?Glucose-Capillary 70 - 99 mg/dL 170 (H)  ?(H): Data is abnormally high ? ? ?Home DM Meds: Humalog 6 units tid with meals ?  Ozempic 1 mg weekly ?  Toujeo 34 units daily ? ? ?Current Orders: Semglee 20 units daily ?  Novolog Sensitive Correction Scale/ SSI (0-9 units) TID AC + HS ? ? ? ? ?MD- Please consider: ? ?1. Increase Semglee slightly to 22 units Daily ? ?2. Start Novolog Meal Coverage: Novolog 6 units TID with meals (home dose) ?HOLD if pt eats <50% meals ? ? ? ?--Will follow patient during hospitalization-- ? ?Wyn Quaker RN, MSN, CDE ?Diabetes Coordinator ?Inpatient Glycemic Control Team ?Team Pager: [phone] (8a-5p) ? ?

## 2022-02-28 NOTE — Plan of Care (Signed)
°  Problem: Education: °Goal: Ability to demonstrate management of disease process will improve °Outcome: Progressing °  °Problem: Activity: °Goal: Capacity to carry out activities will improve °Outcome: Progressing °  °Problem: Health Behavior/Discharge Planning: °Goal: Ability to manage health-related needs will improve °Outcome: Progressing °  °

## 2022-03-01 LAB — BASIC METABOLIC PANEL
Anion gap: 11 (ref 5–15)
BUN: 65 mg/dL — ABNORMAL HIGH (ref 8–23)
CO2: 35 mmol/L — ABNORMAL HIGH (ref 22–32)
Calcium: 8.6 mg/dL — ABNORMAL LOW (ref 8.9–10.3)
Chloride: 94 mmol/L — ABNORMAL LOW (ref 98–111)
Creatinine, Ser: 2.67 mg/dL — ABNORMAL HIGH (ref 0.44–1.00)
GFR, Estimated: 17 mL/min — ABNORMAL LOW (ref >60)
Glucose, Bld: 185 mg/dL — ABNORMAL HIGH (ref 70–99)
Potassium: 3.6 mmol/L (ref 3.5–5.1)
Sodium: 140 mmol/L (ref 135–145)

## 2022-03-01 LAB — GLUCOSE, CAPILLARY
Glucose-Capillary: 166 mg/dL — ABNORMAL HIGH (ref 70–99)
Glucose-Capillary: 255 mg/dL — ABNORMAL HIGH (ref 70–99)
Glucose-Capillary: 218 mg/dL — ABNORMAL HIGH (ref 70–99)
Glucose-Capillary: 238 mg/dL — ABNORMAL HIGH (ref 70–99)

## 2022-03-01 LAB — CBC
HCT: 27 % — ABNORMAL LOW (ref 36.0–46.0)
Hemoglobin: 8 g/dL — ABNORMAL LOW (ref 12.0–15.0)
MCH: 30.1 pg (ref 26.0–34.0)
MCHC: 29.6 g/dL — ABNORMAL LOW (ref 30.0–36.0)
MCV: 101.5 fL — ABNORMAL HIGH (ref 80.0–100.0)
Platelets: 314 10*3/uL (ref 150–400)
RBC: 2.66 MIL/uL — ABNORMAL LOW (ref 3.87–5.11)
RDW: 19.4 % — ABNORMAL HIGH (ref 11.5–15.5)
WBC: 10.8 10*3/uL — ABNORMAL HIGH (ref 4.0–10.5)
nRBC: 0.4 % — ABNORMAL HIGH (ref 0.0–0.2)

## 2022-03-01 MED ORDER — LOPERAMIDE HCL 2 MG PO CAPS
2.0000 mg | ORAL_CAPSULE | Freq: Once | ORAL | Status: AC
Start: 2022-03-01 — End: 2022-03-01
  Administered 2022-03-01: 2 mg via ORAL
  Filled 2022-03-01: qty 1

## 2022-03-01 NOTE — Progress Notes (Signed)
Orthopedic Tech Progress Note ?Patient Details:  ?Katelyn Mahoney ?[birth year] ?[id number] ? ?Ortho Devices ?Type of Ortho Device: Unna boot ?Ortho Device/Splint Location: blw ?Ortho Device/Splint Interventions: Ordered, Application ?  ?Post Interventions ?Patient Tolerated: Well ?Instructions Provided: Care of device ? ?[name] ?03/01/2022, 6:50 PM ?Applied unna boots ?

## 2022-03-01 NOTE — Progress Notes (Signed)
Mobility Specialist Progress Note ? ? 03/01/22 1634  ?Mobility  ?Activity Ambulated with assistance in room;Stood at bedside ?(Stood at chair*)  ?Level of Assistance Contact guard assist, steadying assist  ?Assistive Device Front wheel walker  ?Distance Ambulated (ft) 12 ft ?(4+4+4)  ?Activity Response Tolerated well  ?$Mobility charge 1 Mobility  ? ?Received pt in chair on 2.5LO2 w/ no complaints and agreeable. Pt requiring CG for safety but requiring no physical assistance. x3 STS w/ 35ft bouts in between. Pt SpO2 momentarily dropped to 82% on an unclear pleth but pt had no complaints throughout. Left in chair in prep for dinner w/ call bell in reach.    ? ?Pre Mobility: 102 HR, 97/31 BP, 100% SpO2 on 2.5LO2 ?During Mobility: 121 HR, 90% SpO2 on 2.5LO2 ?Post Mobility: 111 HR, 90/34 BP, 99% SpO2 ? ?Holland Falling ?Mobility Specialist ?Phone Number [phone] ? ?

## 2022-03-01 NOTE — Progress Notes (Signed)
OT Cancellation Note ? ?Patient Details ?Name: Katelyn Mahoney ?MRN: [id number] ?DOB: 1934/07/04 ? ? ?Cancelled Treatment:    Reason Eval/Treat Not Completed: Other (comment) Attempted x2 to see patient, patient now visiting with friend. Patient also planning to go home with hospice therefore beginning to follow from a distance. OT will continue to follow. ? ?Corinne Ports E. Madalee Altmann, OTR/L ?Acute Rehabilitation Services ?[phone] ?[phone]  ? ?Corinne Ports Epifanio Labrador ?03/01/2022, 1:58 PM ?

## 2022-03-01 NOTE — Care Management Important Message (Signed)
Important Message ? ?Patient Details  ?Name: Katelyn Mahoney ?MRN: [id number] ?Date of Birth: 18-Mar-1934 ? ? ?Medicare Important Message Given:  Yes ? ? ? ? ?Shelda Altes ?03/01/2022, 9:23 AM ?

## 2022-03-01 NOTE — TOC Progression Note (Signed)
Transition of Care (TOC) - Progression Note  ? ? ?Patient Details  ?Name: Katelyn Mahoney ?MRN: [id number] ?Date of Birth: 05-13-1934 ? ?Transition of Care (TOC) CM/SW Contact  ?Corby Villasenor Renold Don, LCSWA ?Phone Number: ?03/01/2022, 9:47 AM ? ?Clinical Narrative:    ?CSW spoke with pt daughter who shared that the pt is now wanting to go home with hospice. CSW informed MD about the need for a palliative consult.  ? ? ?Expected Discharge Plan: [place] ?Barriers to Discharge: Continued Medical Work up ? ?Expected Discharge Plan and Services ?Expected Discharge Plan: [place] ?In-house Referral: Clinical Social Work ?Discharge Planning Services: CM Consult ?Post Acute Care Choice: [place] ?Living arrangements for the past 2 months: [place] ?                ?  ?DME Agency: NA ?  ?  ?  ?HH Arranged: RN, PT, Nurse's Aide, Social Work, OT ?[place] Agency: [facility] ?Date HH Agency Contacted: 02/14/22 ?Time [place]: 2751 ?Representative spoke with at [place]: Tommi Rumps ? ? ?Social Determinants of Health (SDOH) Interventions ?  ? ?Readmission Risk Interventions ?Readmission Risk Prevention Plan 02/14/2022 01/26/2022  ?Transportation Screening Complete Complete  ?PCP or Specialist Appt within 3-5 Days Complete Complete  ?[place] or Home Care Consult Complete Complete  ?Social Work Consult for [place] Planning/Counseling Complete Complete  ?Palliative Care Screening Not Applicable Not Applicable  ?Medication Review Press photographer) Complete Complete  ?Some recent data might be hidden  ? ? ?

## 2022-03-01 NOTE — Progress Notes (Signed)
?   03/01/22 1020  ?Assess: MEWS Score  ?Temp 98.3 ?F (36.8 ?C)  ?BP 103/61  ?Pulse Rate 100  ?ECG Heart Rate (!) 103  ?Resp (!) 21  ?SpO2 98 %  ?Assess: MEWS Score  ?MEWS Temp 0  ?MEWS Systolic 0  ?MEWS Pulse 1  ?MEWS RR 1  ?MEWS LOC 0  ?MEWS Score 2  ?MEWS Score Color Yellow  ?Assess: if the MEWS score is Yellow or Red  ?Were vital signs taken at a resting state? Yes  ?Focused Assessment No change from prior assessment  ?Early Detection of Sepsis Score *See Row Information* High  ?MEWS guidelines implemented *See Row Information* Yes  ?Treat  ?MEWS Interventions Escalated (See documentation below)  ?Pain Scale 0-10  ?Pain Score 0  ?Take Vital Signs  ?Increase Vital Sign Frequency  Yellow: Q 2hr X 2 then Q 4hr X 2, if remains yellow, continue Q 4hrs  ?Escalate  ?MEWS: Escalate Yellow: discuss with charge nurse/RN and consider discussing with provider and RRT  ?Notify: Charge Nurse/RN  ?Name of Charge Nurse/RN Notified Sharmon Leyden, RN  ?Date Charge Nurse/RN Notified 03/01/22  ?Time Charge Nurse/RN Notified 1042  ?Document  ?Patient Outcome Stabilized after interventions  ?Progress note created (see row info) Yes  ? ? ?

## 2022-03-01 NOTE — Progress Notes (Signed)
? ?                                                                                                                                                     ?                                                   ?Daily Progress Note  ? ?Patient Name: Katelyn Mahoney       Date: 03/01/2022 ?DOB: [birth year]  Age: 88 y.o. MRN#: [id number] ?Attending Physician: Domenic Polite, MD ?Primary Care Physician: Carollee Herter, Alferd Apa, DO ?Admit Date: 02/12/2022 ? ?Reason for Consultation/Follow-up: Establishing goals of care ? ?Subjective: ?Medical records reviewed. Patient assessed.  She reports doing well, acknowledges her intermittent confusion. ? ?Reviewed interval history since our last conversation.  She continues to be interested in conservative management, hospice if she declines.  She is agreeable to this PA calling her daughter and offering support. ? ?I then called Mariann Laster and we discussed the difference between palliative care and hospice in detail.  Discussed the efforts that she and her son have been making to ensure Katelyn Mahoney's success at home when able to return.  Mariann Laster is very supportive of Katelyn Mahoney and desires to honor her wishes.  Her goal is for her to be as comfortable as possible and remain as alert for as long as possible.  We discussed the difficulty Katelyn Mahoney's face with her health over time.  I informed her that her mother would be eligible for hospice at this point in her trajectory if aligned with her goals of care.  Mariann Laster shares that her mother has voiced interest in hospice, but Mariann Laster is not sure whether she wishes to proceed with SNF, as she has been confused at times. ? ?Upon receiving confirmation that Katelyn Mahoney desires hospice at discharge, I called back Mariann Laster to explain the hospice referral process.  Encouraged her to advocate for equipment needs such as the scooter chair that she expressed interest in.  Emotional support and therapeutic listening was provided. ? ?Questions and concerns addressed.  PMT will continue to support holistically. ? ?Length of Stay: 17 ? ?Current Medications: ?Scheduled Meds:  ? acetaZOLAMIDE  250 mg Oral BID  ? apixaban  2.5 mg Oral BID  ? atorvastatin  10 mg Oral Daily  ? diltiazem  120 mg Oral Daily  ? gabapentin  100 mg Oral BID  ? insulin aspart  0-5 Units Subcutaneous QHS  ? insulin aspart  0-9 Units Subcutaneous TID WC  ? insulin glargine-yfgn  20 Units Subcutaneous Daily  ? iron polysaccharides  150 mg Oral Daily  ? lactulose  20 g Oral Daily  ? metoprolol  tartrate  12.5 mg Oral BID  ? pantoprazole  40 mg Oral Daily  ? sertraline  100 mg Oral Daily  ? sodium chloride flush  3 mL Intravenous Q12H  ? torsemide  60 mg Oral BID  ? umeclidinium bromide  1 puff Inhalation Daily  ? ? ?Continuous Infusions: ? sodium chloride    ? ? ?PRN Meds: ?sodium chloride, acetaminophen, fluocinonide cream, guaiFENesin, levalbuterol, ondansetron (ZOFRAN) IV, sodium chloride, sodium chloride flush ? ?Physical Exam ?Vitals and nursing note reviewed.  ?Constitutional:   ?   General: She is not in acute distress. ?   Appearance: She is obese.  ?   Interventions: Nasal cannula in place.  ?   Comments: 4L  ?Cardiovascular:  ?   Rate and Rhythm: Normal rate and regular rhythm.  ?Pulmonary:  ?   Effort: Pulmonary effort is normal.  ?Neurological:  ?   Mental Status: She is alert and oriented to person, place, and time.  ?Psychiatric:     ?   Mood and Affect: Mood normal.     ?   Behavior: Behavior is cooperative.  ?         ? ?Vital Signs: BP 107/71 (BP Location: Left Arm)   Pulse (!) 104   Temp 98.4 ?F (36.9 ?C) (Oral)   Resp (!) 21   Ht 5\' 4"  (1.626 m)   Wt 132 kg   LMP  (LMP Unknown)   SpO2 99%   BMI 49.95 kg/m?  ?SpO2: SpO2: 99 % ?O2 Device: O2 Device: Nasal Cannula ?O2 Flow Rate: O2 Flow Rate (L/min): 4 L/min ? ?Intake/output summary:  ?Intake/Output Summary (Last 24 hours) at 03/01/2022 1131 ?Last data filed at 03/01/2022 0803 ?Gross per 24 hour  ?Intake 480 ml  ?Output 600 ml  ?Net -120 ml   ? ? ?LBM: Last BM Date : 02/28/22 ?Baseline Weight: Weight: 131.1 kg ?Most recent weight: Weight: 132 kg ? ?     ?Palliative Assessment/Data: 50% ? ? ? ? ? ?Patient Active Problem List  ? Diagnosis Date Noted  ? Acute hepatic encephalopathy 02/26/2022  ? UTI (urinary tract infection) 02/21/2022  ? Paroxysmal atrial fibrillation (HCC)   ? Atrial fibrillation status post cardioversion [facility])   ? Cardiorenal syndrome   ? Anemia   ? Pulmonary nodule 02/14/2022  ? Chronic atrial fibrillation with RVR ([place]) 02/13/2022  ? Chronic kidney disease (CKD), stage IV (severe) ([place]) 02/13/2022  ? CHF (congestive heart failure) ([place]) 02/12/2022  ? Acute on chronic respiratory failure with hypoxia ([place]) 02/08/2022  ? Gastrointestinal hemorrhage   ? Persistent atrial fibrillation ([place])   ? Acute exacerbation of CHF (congestive heart failure) ([place]) 01/09/2022  ? Atrial fibrillation with RVR ([place])   ? Acute congestive heart failure ([place])   ? Urinary tract infection 12/13/2021  ? AKI (acute kidney injury) ([place]) 12/13/2021  ? At risk for sepsis 12/13/2021  ? Primary osteoarthritis of both knees 03/09/2021  ? Urinary frequency 03/09/2021  ? Depression, major, single episode, in partial remission ([place]) 03/09/2021  ? Edema of lower extremity 08/20/2017  ? Hyperlipidemia 08/20/2017  ? CKD stage G3b/A1, GFR 30-44 and albumin creatinine ratio <30 mg/g (HCC) 02/27/2017  ? Allergic urticaria 08/02/2016  ? Fungal dermatitis 11/08/2015  ? Right leg pain 10/14/2015  ? Goals of care, counseling/discussion 11/09/2014  ? Dermatophytosis of nail 09/24/2013  ? Arthritis   ? Claw toe (acquired) 04/17/2013  ? Hammer toe, acquired 04/17/2013  ? Spherocytosis, hereditary ([place]) 08/30/2011  ? Type 2  diabetes mellitus with hyperlipidemia ([place]) 10/17/2010  ? Obesity, Class III, BMI 40-49.9 (morbid obesity) ([place]) 03/19/2008  ? Primary hypertension 03/19/2008  ? ROTATOR CUFF SPRAIN AND STRAIN 03/19/2008  ? Hyperlipidemia associated with type 2 diabetes mellitus  ([place]) 09/05/2007  ? Depression 09/05/2007  ? ROSACEA 05/19/2007  ? ? ?Palliative Care Assessment & Plan  ? ?Patient Profile: ?88 y.o. female  with past medical history of persistent A-fib, chronic diastolic CHF, right ventricular failure, insulin-dependent type 2 diabetes mellitus, CKD 4, morbid obesity-BMI 51, chronic respiratory failure admitted on 02/12/2022 with worsening dyspnea and hypoxia, as well as worsening leg swelling and ulcers over the left ankle. ?  ?In the ED, patient was in A-fib RVR with heart rate in the 120s, chest x-ray noted CHF. S/p cardioversion on 2/14 with overall poor prognosis. Today Cr is slightly increased from 2.33 -> 2.43 and glucose remains elevated. Palliative medicine has been consulted to assist with goals of care conversation. ? ?Assessment: ?Goals of care conversation ?Acute on chronic hypoxic respiratory failure, stable ?Acute on chronic CHF ?CKD4 ? ?Recommendations/Plan: ?Patient and family wish for discharge home with hospice ?Psychosocial and emotional support provided ?PMT remains available for acute needs ? ?Prognosis: ? Poor prognosis given acute exacerbation of multiple chronic conditions, functional decline, and advanced age ? ?Discharge Planning: ?Home with Hospice ? ? ?Total time: ?I spent 35 minutes in the care of the patient today in the above activities and documenting the encounter. ? ? ?Dorthy Cooler, PA-C ?Palliative Medicine Team ?Team phone # [phone] ? ?Thank you for allowing the Palliative Medicine Team to assist in the care of this patient. Please utilize secure chat with additional questions, if there is no response within 30 minutes please call the above phone number. ? ?Palliative Medicine Team providers are available by phone from 7am to 7pm daily and can be reached through the team cell phone.  ?Should this patient require assistance outside of these hours, please call the patient's attending physician.  ? ?

## 2022-03-01 NOTE — Progress Notes (Signed)
?PROGRESS NOTE ? ? ? Katelyn Mahoney  KDT:[id number] DOB: 04-Mar-1934 DOA: 02/12/2022 ?PCP: Katelyn Held, DO  ?Narrative [name] chronically ill with history of persistent A-fib, chronic diastolic CHF, right ventricular failure, type 2 diabetes mellitus, CKD 4, morbid obesity-BMI 51, chronic respiratory failure / COPD (long history of secondhand exposure -on 4 L home O2 for 2 to 3 months ) was brought to the ED with worsening dyspnea and hypoxia, in the ED she was in A-fib RVR with heart rate in the 120s, chest x-ray noted CHF ?-Admitted, started on diuretics, underwent cardioversion on 2/14 ?-Has been on high-dose diuretics, with suboptimal response-has CKD 4 ?-then developed UTi, Rx w/ Abx ?-Had poor response to diuretics, cardiology discussed right heart cath with patient 2/20, patient declined this, wants conservative management ?-Palliative care consulted, s/p goals of care meeting, conservative management, goal for SNF, transition to hospice when declines further ?-subsequently developed hepatic encephalopathy, asterixis improving on lactulose ?-Plan was for SNF, now patient has decided to go home with hospice services ? ?Subjective: Having frequent diarrhea from lactulose, breathing okay ? ?Assessment & Plan ? ?Acute on chronic hypoxic respiratory failure  ?Acute on chronic diastolic CHF ?-Multifactorial respiratory failure secondary to CHF-worsened by A-fib, obesity,? COPD (heavy 2ndary exposure to smoking-has been on 4L O2 for 2+months ?-Cardioverted to sinus rhythm on 2/14 ?-Output remains inaccurate, weight trended up now slowly improving ?-Has responded poorly to diuretics, despite high dose IV lasix, albumin, metolazone etc ?-Some component of her edema is likely from venous insufficiency,  UNNA boots used  ?-Dr. Virgina Jock discussed right heart cath 2/20 -she declined this, wanted conservative management only, overall prognosis is poor in the setting of chronic respiratory failure, CHF, CKD 4,  morbid obesity, debility, poor response to diuretics etc. palliative care consulted,  ?-now on torsemide ?-Initially plan was to short-term rehab with palliative care, now changed to home with hospice services, TOC to arrange home hospice services, plan for home tomorrow ? ?Atrial fibrillation with RVR ?-Cardioverted to sinus rhythm 2/14, continue metoprolol and diltiazem, in sinus rhythm now ?-Continue apixaban ? ?CKD (chronic kidney disease), stage IV ([place])- (present on admission) ?-Baseline creatinine around 2 ?-Creatinine trended up to 2.8, now stable in 2.6-2.7 range ?-Suspect cardiorenal, continue diuretics as above, BMP in a.m. ? ?Acute hepatic encephalopathy ?Congested hepatopathy.  ? -Asterixis has improved, Liver US with no acute changes.  ?-continue lactulose, dose decreased ?-Mentation improved and stable ?  ?Acute on chronic anemia ?-Baseline hemoglobin around 8, anemia panel in January with severe iron deficiency ?-Hemoglobin down to 7.8 and stable, given 4 doses of IV iron ?-Patient denies any overt bleeding,  had an EGD and colonoscopy in January 01, 2022 which were unrevealing ? ?UTI ?-Urine culture with pansensitive E. Coli and Enterobacter ?-Discussed with Pharm.D., Enterobacter is resistant to cephalosporins, treated w/ levaquin x 3days ? ?Obesity, Class III, BMI 40-49.9 (morbid obesity) ([place])- (present on admission) ?Calculated BMI is 50.6 ? ?Diabetes mellitus ([place]) ?-stable, continue semglee, continue meal coverage ? ?DVT prophylaxis: Apixaban ?Code Status: DNR ?Family Communication: No family at bedside, called and updated daughter Katelyn Mahoney ?Disposition Plan: Home with hospice tomorrow ? ?Consultants:  ?Cardiology ? ?Procedures: DC cardioversion 2/14 ? ?Antimicrobials:  ? ? ?Objective: ?Vitals:  ? 03/01/22 0741 03/01/22 1020 03/01/22 1035 03/01/22 1200  ?BP:  103/61 107/71 (!) 118/58  ?Pulse:  100 (!) 104 97  ?Resp:  (!) 21  (!) 23  ?Temp:  98.3 ?F (36.8 ?C) 98.4 ?F (36.9 ?C) 98 ?F (36.7 ?  C)   ?TempSrc:  Oral Oral Oral  ?SpO2: 97% 98% 99% 96%  ?Weight:      ?Height:      ? ? ?Intake/Output Summary (Last 24 hours) at 03/01/2022 1404 ?Last data filed at 03/01/2022 0803 ?Gross per 24 hour  ?Intake 240 ml  ?Output 600 ml  ?Net -360 ml  ? ?Filed Weights  ? 02/26/22 0342 02/27/22 0448 02/28/22 0404  ?Weight: 132.7 kg 132.2 kg 132 kg  ? ? ?Examination: ? ?General exam: Morbidly obese chronically ill female sitting up in bed, AAOx3, no distress ?HEENT: Positive JVD ?CVS: S1-S2, regular rate rhythm ?Lungs: Improved air movement, decreased breath sounds to bases ?Abdomen: Soft, obese, nontender, bowel sounds present ?Extremities: Trace edema  ?Psychiatry: Judgement and insight appear normal. Mood & affect appropriate.  ? ? ? ?Data Reviewed:  ? ?CBC: ?Recent Labs  ?Lab 02/27/22 ?0311 03/01/22 ?0350  ?WBC 16.8* 10.8*  ?HGB 8.6* 8.0*  ?HCT 30.3* 27.0*  ?MCV 102.0* 101.5*  ?PLT 293 314  ? ?Basic Metabolic Panel: ?Recent Labs  ?Lab 02/24/22 ?0306 02/25/22 ?0258 02/26/22 ?0720 02/27/22 ?2542 02/28/22 ?0310 03/01/22 ?0350  ?NA 139 137 137 138 138 140  ?K 3.9 3.4* 3.1* 3.4* 3.3* 3.6  ?CL 92* 90* 91* 90* 94* 94*  ?CO2 37* 38* 38* 36* 36* 35*  ?GLUCOSE 188* 266* 211* 179* 186* 185*  ?BUN 55* 61* 67* 68* 67* 65*  ?CREATININE 2.68* 2.76* 2.76* 2.80* 2.71* 2.67*  ?CALCIUM 9.2 8.9 8.5* 8.8* 8.6* 8.6*  ?MG 1.9  --   --  1.9  --   --   ? ?GFR: ?Estimated Creatinine Clearance: 20.8 mL/min (A) (by C-G formula based on SCr of 2.67 mg/dL (H)). ?Liver Function Tests: ?Recent Labs  ?Lab 02/26/22 ?0720 02/27/22 ?0311  ?AST 20 23  ?ALT 12 14  ?ALKPHOS 53 60  ?BILITOT 0.4 0.6  ?PROT 5.7* 6.3*  ?ALBUMIN 2.8* 3.1*  ? ?No results for input(s): LIPASE, AMYLASE in the last 168 hours. ?Recent Labs  ?Lab 02/26/22 ?0720  ?AMMONIA 59*  ? ?Coagulation Profile: ?No results for input(s): INR, PROTIME in the last 168 hours. ? ?Cardiac Enzymes: ?No results for input(s): CKTOTAL, CKMB, CKMBINDEX, TROPONINI in the last 168 hours. ?BNP (last 3 results) ?No  results for input(s): PROBNP in the last 8760 hours. ?HbA1C: ?No results for input(s): HGBA1C in the last 72 hours. ?CBG: ?Recent Labs  ?Lab 02/28/22 ?1057 02/28/22 ?1601 02/28/22 ?2122 03/01/22 ?7062 03/01/22 ?1047  ?GLUCAP 282* 233* 247* 166* 255*  ? ?Lipid Profile: ?No results for input(s): CHOL, HDL, LDLCALC, TRIG, CHOLHDL, LDLDIRECT in the last 72 hours. ?Thyroid Function Tests: ?No results for input(s): TSH, T4TOTAL, FREET4, T3FREE, THYROIDAB in the last 72 hours. ?Anemia Panel: ?No results for input(s): VITAMINB12, FOLATE, FERRITIN, TIBC, IRON, RETICCTPCT in the last 72 hours. ? ?Urine analysis: ?   ?Component Value Date/Time  ? [place] YELLOW 02/17/2022 1824  ? APPEARANCEUR HAZY (A) 02/17/2022 1824  ? LABSPEC 1.012 02/17/2022 1824  ? PHURINE 5.0 02/17/2022 1824  ? [place] NEGATIVE 02/17/2022 1824  ? HGBUR SMALL (A) 02/17/2022 1824  ? [place] NEGATIVE 02/17/2022 1824  ? [place] Negative 03/09/2021 0852  ? [place] NEGATIVE 02/17/2022 1824  ? PROTEINUR 30 (A) 02/17/2022 1824  ? UROBILINOGEN 0.2 03/09/2021 0852  ? UROBILINOGEN 0.2 03/10/2009 0801  ? NITRITE NEGATIVE 02/17/2022 1824  ? LEUKOCYTESUR LARGE (A) 02/17/2022 1824  ? ?Sepsis Labs: ?@LABRCNTIP (procalcitonin:4,lacticidven:4) ? ?) ?No results found for this or any previous visit (from the past 240 hour(s)). ?  ? ?  Radiology Studies: ?No results found. ? ? ?Scheduled Meds: ? acetaZOLAMIDE  250 mg Oral BID  ? apixaban  2.5 mg Oral BID  ? atorvastatin  10 mg Oral Daily  ? diltiazem  120 mg Oral Daily  ? gabapentin  100 mg Oral BID  ? insulin aspart  0-5 Units Subcutaneous QHS  ? insulin aspart  0-9 Units Subcutaneous TID WC  ? insulin glargine-yfgn  20 Units Subcutaneous Daily  ? iron polysaccharides  150 mg Oral Daily  ? lactulose  20 g Oral Daily  ? metoprolol tartrate  12.5 mg Oral BID  ? pantoprazole  40 mg Oral Daily  ? sertraline  100 mg Oral Daily  ? sodium chloride flush  3 mL Intravenous Q12H  ? torsemide  60 mg Oral BID  ? umeclidinium  bromide  1 puff Inhalation Daily  ? ?Continuous Infusions: ? sodium chloride    ? ? ? LOS: 17 days  ? ? ?Time spent: 19min ? ?Domenic Polite, MD ?[facility] ? ? ?03/01/2022, 2:04 PM  ?  ?

## 2022-03-01 NOTE — Plan of Care (Signed)
?  Problem: Education: ?Goal: Ability to verbalize understanding of medication therapies will improve ?Outcome: Progressing ?  ?Problem: Activity: ?Goal: Capacity to carry out activities will improve ?Outcome: Progressing ?  ?Problem: Cardiac: ?Goal: Ability to achieve and maintain adequate cardiopulmonary perfusion will improve ?Outcome: Progressing ?  ?

## 2022-03-02 ENCOUNTER — Ambulatory Visit: Payer: Medicare Other | Admitting: Cardiology

## 2022-03-02 ENCOUNTER — Other Ambulatory Visit (HOSPITAL_COMMUNITY): Payer: Self-pay

## 2022-03-02 ENCOUNTER — Telehealth: Payer: Self-pay | Admitting: Family Medicine

## 2022-03-02 LAB — GLUCOSE, CAPILLARY
Glucose-Capillary: 173 mg/dL — ABNORMAL HIGH (ref 70–99)
Glucose-Capillary: 233 mg/dL — ABNORMAL HIGH (ref 70–99)

## 2022-03-02 MED ORDER — GABAPENTIN 100 MG PO CAPS: 100.0000 mg | ORAL_CAPSULE | Freq: Two times a day (BID) | ORAL | Status: AC

## 2022-03-02 MED ORDER — LACTULOSE ENCEPHALOPATHY 10 GM/15ML PO SOLN
10.0000 g | Freq: Every day | ORAL | 0 refills | Status: AC | PRN
  Filled 2022-03-02: qty 236, 15d supply, fill #0

## 2022-03-02 MED ORDER — TOUJEO SOLOSTAR 300 UNIT/ML ~~LOC~~ SOPN: 25.0000 [IU] | PEN_INJECTOR | Freq: Every day | SUBCUTANEOUS | Status: AC

## 2022-03-02 MED ORDER — ACETAZOLAMIDE 250 MG PO TABS
250.0000 mg | ORAL_TABLET | Freq: Two times a day (BID) | ORAL | 0 refills | Status: AC
  Filled 2022-03-02: qty 60, 30d supply, fill #0

## 2022-03-02 MED ORDER — POTASSIUM CHLORIDE CRYS ER 20 MEQ PO TBCR: 20.0000 meq | EXTENDED_RELEASE_TABLET | Freq: Every day | ORAL | 0 refills | Status: AC

## 2022-03-02 NOTE — Progress Notes (Signed)
PT Cancellation Note ? ?Patient Details ?Name: Katelyn Mahoney ?MRN: [id number] ?DOB: 1934/10/07 ? ? ?Cancelled Treatment:    Reason Eval/Treat Not Completed: Other (comment) pt with planned d/c home with hospice today, all DME has been arranged and pt has mobilized with OT and mobility tech this morning. She declined PT session and is eager to return home. Will sign off at this time. Please re-consult if change in status.  ? ?[place], PT, DPT  ? ?Acute Rehabilitation Department ?Pager #: [phone] ? ? ?Sandra Cockayne ?03/02/2022, 12:55 PM ?

## 2022-03-02 NOTE — Progress Notes (Signed)
Mobility Specialist Progress Note: ? ? 03/02/22 1000  ?Mobility  ?Activity Transferred to/from [facility]  ?Level of Assistance Standby assist, set-up cues, supervision of patient - no hands on  ?Assistive Device Front wheel walker  ?Distance Ambulated (ft) 2 ft  ?Activity Response Tolerated well  ?$Mobility charge 1 Mobility  ? ?Pt received in bed, agreed to mobility. Required min guard throughout transfer. Pt left on BSC with call bell. Will f/u for hopeful ambulation.  ? ?Nelta Numbers ?Acute Rehab ?Phone: [phone] ?Office Phone: [phone] ? ?

## 2022-03-02 NOTE — Plan of Care (Signed)
?  Problem: Education: ?Goal: Ability to demonstrate management of disease process will improve ?Outcome: Progressing ?Goal: Ability to verbalize understanding of medication therapies will improve ?Outcome: Progressing ?Goal: Individualized Educational Video(s) ?Outcome: Progressing ?  ?Problem: Activity: ?Goal: Capacity to carry out activities will improve ?Outcome: Progressing ?  ?Problem: Cardiac: ?Goal: Ability to achieve and maintain adequate cardiopulmonary perfusion will improve ?Outcome: Progressing ?  ?Problem: Education: ?Goal: Knowledge of General Education information will improve ?Description: Including pain rating scale, medication(s)/side effects and non-pharmacologic comfort measures ?Outcome: Progressing ?  ?Problem: Health Behavior/Discharge Planning: ?Goal: Ability to manage health-related needs will improve ?Outcome: Progressing ?  ?Problem: Clinical Measurements: ?Goal: Ability to maintain clinical measurements within normal limits will improve ?Outcome: Progressing ?Goal: Diagnostic test results will improve ?Outcome: Progressing ?Goal: Respiratory complications will improve ?Outcome: Progressing ?Goal: Cardiovascular complication will be avoided ?Outcome: Progressing ?  ?Problem: Activity: ?Goal: Risk for activity intolerance will decrease ?Outcome: Progressing ?  ?Problem: Nutrition: ?Goal: Adequate nutrition will be maintained ?Outcome: Progressing ?  ?Problem: Coping: ?Goal: Level of anxiety will decrease ?Outcome: Progressing ?  ?Problem: Safety: ?Goal: Ability to remain free from injury will improve ?Outcome: Progressing ?  ?Problem: Skin Integrity: ?Goal: Risk for impaired skin integrity will decrease ?Outcome: Progressing ?  ?

## 2022-03-02 NOTE — TOC Progression Note (Addendum)
Transition of Care (TOC) - Progression Note  ? ? ?Patient Details  ?Name: Katelyn Mahoney ?MRN: [id number] ?Date of Birth: Aug 27, 1934 ? ?Transition of Care (TOC) CM/SW Contact  ?Zenon Mayo, RN ?Phone Number: ?03/02/2022, 10:07 AM ? ?Clinical Narrative:    ?Patient now would like home hospice, NCM spoke with daughter, Mariann Laster, [place] offered choice, she chose [facility], NCM made referral to [facility] for home hospice. Informed them to contact the daughter to coordinate.  Daughter states she has home oxygen with [employer] 4 liters.  She will need a hospital bed, a w/chair, and a bedside table.  Patient will need ambulance transport at dc.  Roselee Nova from [facility], confirmed she got my referral for this patient.  ? ? ?Expected Discharge Plan: [place] ?Barriers to Discharge: Continued Medical Work up ? ?Expected Discharge Plan and Services ?Expected Discharge Plan: [place] ?In-house Referral: Clinical Social Work ?Discharge Planning Services: CM Consult ?Post Acute Care Choice: [place] ?Living arrangements for the past 2 months: [place] ?                ?  ?DME Agency: NA ?  ?  ?  ?HH Arranged: RN, PT, Nurse's Aide, Social Work, OT ?[place] Agency: [facility] ?Date HH Agency Contacted: 02/14/22 ?Time [place]: 2683 ?Representative spoke with at [place]: Tommi Rumps ? ? ?Social Determinants of Health (SDOH) Interventions ?  ? ?Readmission Risk Interventions ?Readmission Risk Prevention Plan 02/14/2022 01/26/2022  ?Transportation Screening Complete Complete  ?PCP or Specialist Appt within 3-5 Days Complete Complete  ?[place] or Home Care Consult Complete Complete  ?Social Work Consult for [place] Planning/Counseling Complete Complete  ?Palliative Care Screening Not Applicable Not Applicable  ?Medication Review Press photographer) Complete Complete  ?Some recent data might be hidden  ? ? ?

## 2022-03-02 NOTE — Progress Notes (Signed)
?   03/02/22 0743  ?Assess: MEWS Score  ?BP 102/74  ?Pulse Rate (!) 55  ?ECG Heart Rate (!) 108  ?Resp (!) 25  ?SpO2 97 %  ?Assess: MEWS Score  ?MEWS Temp 0  ?MEWS Systolic 0  ?MEWS Pulse 1  ?MEWS RR 1  ?MEWS LOC 0  ?MEWS Score 2  ?MEWS Score Color Yellow  ?Assess: if the MEWS score is Yellow or Red  ?Were vital signs taken at a resting state? No  ?Focused Assessment No change from prior assessment  ?Early Detection of Sepsis Score *See Row Information* Low  ?MEWS guidelines implemented *See Row Information* No, other (Comment) ?(pt was getting up to the [facility])  ?Document  ?Patient Outcome Stabilized after interventions  ?Progress note created (see row info) Yes  ? ? ?

## 2022-03-02 NOTE — Telephone Encounter (Signed)
Kristie called from [facility] regarding vo for pt. [phone] ? ?Pt discharged from hospital 3/3. Family would like hospice inside home & for [place] to be the pt's hospice attending physician  ? ?Please advise  ?

## 2022-03-02 NOTE — TOC Transition Note (Addendum)
Transition of Care (TOC) - CM/SW Discharge Note ? ? ?Patient Details  ?Name: Katelyn Mahoney ?MRN: [id number] ?Date of Birth: 21-Feb-1934 ? ?Transition of Care (TOC) CM/SW Contact:  ?Zenon Mayo, RN ?Phone Number: ?03/02/2022, 11:21 AM ? ? ?Clinical Narrative:    ?Patient is for dc home with Hospice today, NCM notified patient and daughter. Daughter states DME does not have to be there before patient. She is good with her going home by ptar today, [facility] Nurse is here , NCM scheduled ptar for pickup.  Forms on chart  ? ? ?Final next level of care: [place] ?Barriers to Discharge: No Barriers Identified ? ? ?Patient Goals and CMS Choice ?Patient states their goals for this hospitalization and ongoing recovery are:: home with hospice ?CMS Medicare.gov Compare Post Acute Care list provided to:: Patient Represenative (must comment) ?Choice offered to / list presented to : Adult Children ? ?Discharge Placement ?  ?           ?  ?  ?  ?  ? ?Discharge Plan and Services ?In-house Referral: Clinical Social Work ?Discharge Planning Services: CM Consult ?Post Acute Care Choice: [place]          ?DME Arranged:  (Hospice to arrange) ?DME Agency: NA ?  ?  ?  ?HH Arranged: RN, PT, Nurse's Aide, Social Work, OT ?[place] Agency: [place] ?Date HH Agency Contacted: 02/14/22 ?Time [place]: 5361 ?Representative spoke with at [place]: Tommi Rumps ? ?Social Determinants of Health (SDOH) Interventions ?  ? ? ?Readmission Risk Interventions ?Readmission Risk Prevention Plan 02/14/2022 01/26/2022  ?Transportation Screening Complete Complete  ?PCP or Specialist Appt within 3-5 Days Complete Complete  ?[place] or Home Care Consult Complete Complete  ?Social Work Consult for [place] Planning/Counseling Complete Complete  ?Palliative Care Screening Not Applicable Not Applicable  ?Medication Review Press photographer) Complete Complete  ?Some recent data might be hidden  ? ? ? ? ? ?

## 2022-03-02 NOTE — Progress Notes (Signed)
Occupational Therapy Treatment ?Patient Details ?Name: Katelyn Mahoney ?MRN: [id number] ?DOB: [birth year] ?Today's Date: 03/02/2022 ? ? ?History of present illness 88 y.o. female admitted 02/14/22 with SOB,  weight gain, uncontrolled A-fib with acute on chronic HFpEF exacerbation. S/p DCCV 2/14. PMhx: admit on 01/08/22 for CHF exacerbation. DM II, HTN, obesity, CKD, HLD, chronic LE edema, chronic hypoxic respiratory failure on 4L O2, Afib, HfpEF ?  ?OT comments ? Pt on BSC on arrival and assisted with peri hygiene this session. Pt reports having two nieces that live on her street but uncertain of family ability to (A) upon d/c. Pt does report having times at home alone per her knowledge. No family present in the room. Pt will sleep in recliner at home and has AE baseline use at home. Pt with HR 121 and desaturation to 86% 4L [place] with bathroom transfer and sit<>Stand. Pt will d/c with hospice care so no therapy ordered at d/c.   ? ?Recommendations for follow up therapy are one component of a multi-disciplinary discharge planning process, led by the attending physician.  Recommendations may be updated based on patient status, additional functional criteria and insurance authorization. ?   ?Follow Up Recommendations ? Skilled nursing-short term rehab (<3 hours/day)  ?  ?Assistance Recommended at Discharge Intermittent Supervision/Assistance  ?Patient can return home with the following ? Help with stairs or ramp for entrance;A little help with bathing/dressing/bathroom;A little help with walking and/or transfers;Assistance with cooking/housework;Direct supervision/assist for financial management;Direct supervision/assist for medications management ?  ?Equipment Recommendations ? None recommended by OT  ?  ?Recommendations for Other Services   ? ?  ?Precautions / Restrictions Precautions ?Precautions: Fall;Other (comment) ?Precaution Comments: 4L O2 at baseline  ? ? ?  ? ?Mobility Bed Mobility ?  ?  ?  ?  ?  ?  ?  ?General bed  mobility comments: oob on arrival ?  ? ?Transfers ?Overall transfer level: Needs assistance ?Equipment used: Rolling walker (2 wheels) ?Transfers: Sit to/from Stand ?Sit to Stand: Min guard ?  ?  ?  ?  ?  ?General transfer comment: pt able to power up from bsc and the chair this session. pt requires use of arms. pt unable to sustain standing without rest break ?  ?  ?Balance Overall balance assessment: Needs assistance ?Sitting-balance support: Bilateral upper extremity supported, Feet supported ?Sitting balance-Leahy Scale: Good ?  ?  ?Standing balance support: Bilateral upper extremity supported, During functional activity, Reliant on assistive device for balance ?Standing balance-Leahy Scale: Poor ?  ?  ?  ?  ?  ?  ?  ?  ?  ?  ?  ?  ?   ? ?ADL either performed or assessed with clinical judgement  ? ?ADL Overall ADL's : Needs assistance/impaired ?  ?  ?  ?  ?  ?  ?  ?  ?  ?  ?  ?  ?Toilet Transfer: Min guard;BSC/3in1;Rolling walker (2 wheels) ?  ?Toileting- Clothing Manipulation and Hygiene: Total assistance ?Toileting - Clothing Manipulation Details (indicate cue type and reason): pt reports unable to complete toilet hygiene when encouraged to complete. pt states i can do it at home cause i have a normal toilet ?  ?  ?Functional mobility during ADLs: Minimal assistance ((A) for line management) ?  ?Pt plans to sink bath at home and educated on water temperature for energy conservation. Pt has a seat already at home and a built in seat for shower per patient. Pt reports fatigue from session.   ? ?  Extremity/Trunk Assessment Upper Extremity Assessment ?Upper Extremity Assessment: Generalized weakness ?  ?  ?  ?  ?  ? ?Vision   ?  ?  ?Perception   ?  ?Praxis   ?  ? ?Cognition Arousal/Alertness: Awake/alert ?Behavior During Therapy: [facility] for tasks assessed/performed ?Overall Cognitive Status: Impaired/Different from baseline ?  ?  ?  ?  ?  ?  ?  ?  ?  ?Orientation Level: Time (ive thought all week everday was Friday) ?   ?  ?  ?  ?Awareness: Emergent ?  ?  ?  ?  ?   ?Exercises General Exercises - Lower Extremity ?Ankle Circles/Pumps: 20 reps, AROM, Seated ?Quad Sets: AROM, Both, 20 reps, Seated ?Hip Flexion/Marching: AROM, Both, 15 reps, Seated ?Other Exercises ?Other Exercises: shoulder flexion ( punch the sky) 2 sets of reps x10 - pt requires rest break due to fatigue ?Other Exercises: shoulder flexion 90 degrees hold for 10 seconds for 4 reps then resting ? ?  ?Shoulder Instructions   ? ? ?  ?General Comments VSS 4L with desat to 86%  ? ? ?Pertinent Vitals/ Pain       Pain Assessment ?Pain Assessment: No/denies pain ? ?Home Living   ?  ?  ?  ?  ?  ?  ?  ?  ?  ?  ?  ?  ?  ?  ?  ?  ?  ?  ? ?  ?Prior Functioning/Environment    ?  ?  ?  ?   ? ?Frequency ? Min 2X/week  ? ? ? ? ?  ?Progress Toward Goals ? ?OT Goals(current goals can now be found in the care plan section) ? Progress towards OT goals: Progressing toward goals ? ?Acute Rehab OT Goals ?Patient Stated Goal: to go home ?OT Goal Formulation: With patient ?Time For Goal Achievement: 03/14/22 ?Potential to Achieve Goals: Fair ?ADL Goals ?Pt Will Perform Grooming: standing;with modified independence ?Pt Will Perform Lower Body Bathing: with modified independence;sit to/from stand ?Pt Will Perform Upper Body Dressing: with set-up;sitting ?Pt Will Perform Lower Body Dressing: with modified independence;sit to/from stand ?Pt Will Transfer to Toilet: with modified independence;ambulating ?Pt Will Perform Toileting - Clothing Manipulation and hygiene: with modified independence;sit to/from stand ?Pt/caregiver will Perform Home Exercise Program: Increased ROM;Increased strength;Both right and left upper extremity;With written HEP provided;With theraband ?Additional ADL Goal #1: Patient will demonstrate increased activity tolerance by completing standing ADL/IADL activity for 2-3 minutes without seated rest breaks.  ?Plan Discharge plan remains appropriate   ? ?Co-evaluation ? ? ?   ?   ?  ?  ?  ? ?  ?AM-PAC OT "6 Clicks" Daily Activity     ?Outcome Measure ? ? Help from another person eating meals?: None ?Help from another person taking care of personal grooming?: A Little ?Help from another person toileting, which includes using toliet, bedpan, or urinal?: A Lot ?Help from another person bathing (including washing, rinsing, drying)?: A Lot ?Help from another person to put on and taking off regular upper body clothing?: A Little ?Help from another person to put on and taking off regular lower body clothing?: A Lot ?6 Click Score: 16 ? ?  ?End of Session Equipment Utilized During Treatment: Rolling walker (2 wheels) ? ?OT Visit Diagnosis: Unsteadiness on feet (R26.81);Other abnormalities of gait and mobility (R26.89);Muscle weakness (generalized) (M62.81) ?  ?Activity Tolerance Patient tolerated treatment well ?  ?Patient Left in chair;with call bell/phone within reach ?  ?Nurse Communication  Mobility status;Other (comment) ?  ? ?   ? ?Time: 1008-1030 ?OT Time Calculation (min): 22 min ? ?Charges: OT General Charges ?$OT Visit: 1 Visit ?OT Treatments ?$Self Care/Home Management : 8-22 mins ? ? ?Katelyn Mahoney, OTR/L  ?Acute Rehabilitation Services ?Pager: [phone] ?Office: [phone] ?. ? ? ?Katelyn Mahoney ?03/02/2022, 10:41 AM ?

## 2022-03-02 NOTE — Progress Notes (Addendum)
Manufacturing engineer [facility]) Hospital Liaison Note ? ?Referral received from [facility] for patient/family interest in home with hospice. [place] liaison spoke with patient's daughter Mariann Laster to confirm interest. Interest confirmed. Chart under review by [facility] physician. ? ?Hospice eligibility confirmed.  ? ?DME in the home: oxygen through adapt ? ?DME needs: hospital bed, wheelchair, bedside table. ? ?Mariann Laster is the contact person for DME delivery.  ? ?Plan is to discharge today via [place].  ? ?Please send patient home with comfort medications/prescriptions at discharge.  ? ?Please call with any questions or concerns. Thank you ? ?Roselee Nova, LCSW ?[facility] Liaison ?[phone] ?

## 2022-03-05 NOTE — Telephone Encounter (Signed)
Verbal given 

## 2022-03-06 NOTE — Discharge Summary (Signed)
Physician Discharge Summary  Katelyn Mahoney ZOX:[id number] DOB: [birth year] DOA: 02/12/2022  PCP: Ann Held, DO  Admit date: 02/12/2022 Discharge date: 03/02/2022  Time spent: 35 minutes  Recommendations for Outpatient Follow-up:  Home with hospice services   Discharge Diagnoses:  Acute on chronic hypoxic respiratory failure Acute on chronic diastolic CHF A-fib with RVR CKD stage IV Morbid obesity Acute hepatic encephalopathy Severe debility   Type 2 diabetes mellitus with hyperlipidemia (HCC)   Obesity, Class III, BMI 40-49.9 (morbid obesity) ([place])   Primary hypertension   UTI (urinary tract infection)   Acute hepatic encephalopathy DO NOT RESUSCITATE  Discharge Condition: Fair  Diet recommendation: Low-sodium  Filed Weights   02/27/22 0448 02/28/22 0404 03/02/22 0159  Weight: 132.2 kg 132 kg 131.8 kg    History of present illness:  [name] chronically ill with history of persistent A-fib, chronic diastolic CHF, right ventricular failure, type 2 diabetes mellitus, CKD 4, morbid obesity-BMI 51, chronic respiratory failure / COPD (long history of secondhand exposure -on 4 L home O2 for 2 to 3 months ) was brought to the ED with worsening dyspnea and hypoxia, in the ED she was in A-fib RVR with heart rate in the 120s, chest x-ray noted CHF  Hospital Course:   Acute on chronic hypoxic respiratory failure  Acute on chronic diastolic CHF -Multifactorial respiratory failure secondary to CHF-worsened by A-fib, obesity, and COPD (heavy 2nd hand exposure to smoking-has been on 4L O2 for 2+months -Cardioverted to sinus rhythm on 2/14 -Has responded poorly to diuretics, despite high dose IV lasix, albumin, metolazone etc -Some component of her edema is likely from venous insufficiency,  [employer]NA boots used  -Dr. Virgina Jock discussed right heart cath 2/20 -she declined this, wanted conservative management only, overall prognosis is poor in the setting of chronic respiratory  failure, CHF, CKD 4, morbid obesity, debility, poor response to diuretics etc. palliative care consulted,  -Initially plan was to short-term rehab with palliative care, now changed to home with hospice services, TOC has arranged home hospice services at discharge, transitioned to oral torsemide as well   Atrial fibrillation with RVR -Cardioverted to sinus rhythm 2/14, continue metoprolol and diltiazem, in sinus rhythm now -Continue apixaban   CKD (chronic kidney disease), stage IV ([place])- (present on admission) -Baseline creatinine around 2 -Creatinine trended up to 2.8, now stable in 2.6-2.7 range   Acute hepatic encephalopathy -Likely secondary to congested hepatopathy.   -Asterixis has improved, Liver US with no acute changes.  -continue lactulose, dose decreased -Mentation improved and stable   Acute on chronic anemia -Baseline hemoglobin around 8, anemia panel in January with severe iron deficiency -Hemoglobin down to 7.8 and stable, given 4 doses of IV iron -Patient denies any overt bleeding,  had an EGD and colonoscopy in January 01, 2022 which were unrevealing   UTI -Urine culture with pansensitive E. Coli and Enterobacter -Discussed with Pharm.D., Enterobacter is resistant to cephalosporins, treated w/ levaquin x 3days   Obesity, Class III, BMI 40-49.9 (morbid obesity) ([place])- (present on admission) Calculated BMI is 50.6   Diabetes mellitus ([place]) -stable, continue semglee, continue meal coverage   Consultants:  Cardiology   Procedures: DC cardioversion 2/14  Discharge Exam: Vitals:   03/02/22 0906 03/02/22 1131  BP:  (!) 103/33  Pulse:  (!) 54  Resp:  16  Temp:    SpO2: 96% 95%   General exam: Morbidly obese chronically ill female sitting up in bed, AAOx3, no distress HEENT: Positive JVD CVS: S1-S2, regular  rate rhythm Lungs: Improved air movement, decreased breath sounds to bases Abdomen: Soft, obese, nontender, bowel sounds present Extremities: Trace edema   Psychiatry: Judgement and insight appear normal. Mood & affect appropriate.    Discharge Instructions   Discharge Instructions     Diet - low sodium heart healthy   Complete by: As directed    Diet Carb Modified   Complete by: As directed    Discharge wound care:   Complete by: As directed    routine   Increase activity slowly   Complete by: As directed       Allergies as of 03/02/2022       Reactions   Bupropion Hcl Rash   Palmar rash   Penicillins Rash   rash   Citalopram Hydrobromide Other (See Comments)   cough   Metformin And Related Diarrhea   Blue Dyes (parenteral) Rash   Cobalt Rash   Gold-containing Drug Products Rash   Nickel Rash   Zinc Rash        Medication List     TAKE these medications    acetaminophen 650 MG CR tablet Commonly known as: TYLENOL 650 mg every 8 (eight) hours.   acetaZOLAMIDE 250 MG tablet Commonly known as: DIAMOX Take 1 tablet (250 mg total) by mouth 2 (two) times daily.   albuterol 108 (90 Base) MCG/ACT inhaler Commonly known as: VENTOLIN HFA Inhale 2 puffs into the lungs every 6 (six) hours as needed for wheezing or shortness of breath.   apixaban 2.5 MG Tabs tablet Commonly known as: ELIQUIS Take 1 tablet (2.5 mg total) by mouth 2 (two) times daily.   atorvastatin 20 MG tablet Commonly known as: LIPITOR TAKE 1/2 TABLET ON MONDAY, WEDNESDAY, FRIDAY AND      SUNDAYS What changed: See the new instructions.   BILBERRY PO Take 1 tablet by mouth daily.   diltiazem 120 MG 24 hr capsule Commonly known as: CARDIZEM CD Take 1 capsule (120 mg total) by mouth daily.   fluocinonide cream 0.05 % Commonly known as: LIDEX Apply 1 application topically daily as needed (breakouts).   gabapentin 100 MG capsule Commonly known as: NEURONTIN Take 1 capsule (100 mg total) by mouth 2 (two) times daily. What changed: when to take this   glipiZIDE 10 MG 24 hr tablet Commonly known as: GLUCOTROL XL TAKE 1 TABLET EVERY  MORNING   GLUCOSAMINE PO Take 1 tablet by mouth daily.   insulin lispro 100 UNIT/ML KwikPen Commonly known as: HumaLOG KwikPen Inject 6 Units into the skin with breakfast, with lunch, and with evening meal. Inject 7-9 units under the skin three times daily before meals. E11.65   Insulin Pen Needle 32G X 4 MM Misc Use on insulin pens 4 times a day   ipratropium-albuterol 0.5-2.5 (3) MG/3ML Soln Commonly known as: DUONEB Take 3 mLs by nebulization every 6 (six) hours as needed.   iron polysaccharides 150 MG capsule Commonly known as: Nu-Iron Take 1 capsule (150 mg total) by mouth daily.   lactulose (encephalopathy) 10 GM/15ML Soln Commonly known as: CHRONULAC Take 15 mLs (10 g total) by mouth daily as needed.   Loratadine 10 MG Caps Take 10 mg by mouth daily.   metoprolol tartrate 25 MG tablet Commonly known as: LOPRESSOR Take 1 tablet (25 mg total) by mouth 2 (two) times daily.   multivitamin-lutein Caps capsule Take 1 capsule by mouth daily.   NONFORMULARY OR COMPOUNDED ITEM Lift chair  #1   Dx osteoarthritis knees  OneTouch Delica Lancets 81E Misc Use to test blood sugar once daily- E11.65   OneTouch Verio test strip Generic drug: glucose blood USE TO TEST BLOOD SUGAR    TWICE A DAY   Ozempic (1 MG/DOSE) 4 MG/3ML Sopn Generic drug: Semaglutide (1 MG/DOSE) INJECT 1MG  SUBCUTANEOUSLY  WEEKLY (EVERY 7 DAYS) What changed: See the new instructions.   pantoprazole 40 MG tablet Commonly known as: PROTONIX Take 1 tablet (40 mg total) by mouth daily.   potassium chloride SA 20 MEQ tablet Commonly known as: KLOR-CON M Take 1 tablet (20 mEq total) by mouth daily. What changed: how much to take   sertraline 100 MG tablet Commonly known as: ZOLOFT TAKE 1 TABLET DAILY What changed:  how much to take how to take this when to take this additional instructions   sodium chloride 0.65 % Soln nasal spray Commonly known as: OCEAN Place 1 spray into both nostrils as  needed for congestion.   Spiriva Respimat 2.5 MCG/ACT Aers Generic drug: Tiotropium Bromide Monohydrate Inhale 1 puff into the lungs daily.   Torsemide 60 MG Tabs Take 60 mg by mouth 2 (two) times daily.   Toujeo SoloStar 300 UNIT/ML Solostar Pen Generic drug: insulin glargine (1 Unit Dial) Inject 25 Units into the skin daily. What changed: See the new instructions.               Discharge Care Instructions  (From admission, onward)           Start     Ordered   03/02/22 0000  Discharge wound care:       Comments: routine   03/02/22 1100           Allergies  Allergen Reactions   Bupropion Hcl Rash    Palmar rash   Penicillins Rash    rash   Citalopram Hydrobromide Other (See Comments)    cough   Metformin And Related Diarrhea   Blue Dyes (Parenteral) Rash   Cobalt Rash   Gold-Containing Drug Products Rash   Nickel Rash   Zinc Rash    Follow-up Information     [facility] [place] Follow up.   Specialty: Home Health Services Why: HHRN, HHPT, [place], Social Work- Agency will contact your for apt times Contact information: [address] [place] [address] [phone]         [facility] Follow up.   Specialty: Hospice and Palliative Medicine Why: home hospice Contact information: [place] [phone]                 The results of significant diagnostics from this hospitalization (including imaging, microbiology, ancillary and laboratory) are listed below for reference.    Significant Diagnostic Studies: DG CHEST PORT 1 VIEW  Result Date: 02/20/2022 CLINICAL DATA:  Diabetes, hypertension EXAM: PORTABLE CHEST 1 VIEW COMPARISON:  02/12/2022 FINDINGS: Pulmonary vascular congestion with mild interstitial edema. Mild progression of edema since the prior study. Bibasilar atelectasis. Small left effusion also unchanged. IMPRESSION: Congestive heart failure.  Mild  progression of pulmonary edema. Electronically Signed   By: Franchot Gallo M.D.   On: 02/20/2022 11:10   DG Chest Port 1 View  Result Date: 02/12/2022 CLINICAL DATA:  Shortness of breath, mid chest tightness. EXAM: PORTABLE CHEST 1 VIEW COMPARISON:  01/19/2022 and CT chest 01/25/2022. FINDINGS: Trachea is midline. Heart size stable. Mild interstitial prominence and indistinctness bilaterally. Left lower lobe volume loss. There may be a left pleural  effusion. IMPRESSION: Mild congestive heart failure.  Left lower lobe atelectasis. Electronically Signed   By: Lorin Picket M.D.   On: 02/12/2022 14:54   US Abdomen Limited RUQ (LIVER/GB)  Result Date: 02/27/2022 CLINICAL DATA:  Encephalopathy EXAM: ULTRASOUND ABDOMEN LIMITED RIGHT UPPER QUADRANT COMPARISON:  None. FINDINGS: Gallbladder: Prior cholecystectomy. Common bile duct: Diameter: 6 mm Liver: No focal lesion identified. Within normal limits in parenchymal echogenicity. Portal vein is patent on color Doppler imaging with normal direction of blood flow towards the liver. Other: None. IMPRESSION: 1. Prior cholecystectomy. 2. No acute right upper quadrant abnormality. Electronically Signed   By: Kathreen Devoid M.D.   On: 02/27/2022 08:49    Microbiology: No results found for this or any previous visit (from the past 240 hour(s)).   Labs: Basic Metabolic Panel: Recent Labs  Lab 02/28/22 0310 03/01/22 0350  NA 138 140  K 3.3* 3.6  CL 94* 94*  CO2 36* 35*  GLUCOSE 186* 185*  BUN 67* 65*  CREATININE 2.71* 2.67*  CALCIUM 8.6* 8.6*   Liver Function Tests: No results for input(s): AST, ALT, ALKPHOS, BILITOT, PROT, ALBUMIN in the last 168 hours. No results for input(s): LIPASE, AMYLASE in the last 168 hours. No results for input(s): AMMONIA in the last 168 hours. CBC: Recent Labs  Lab 03/01/22 0350  WBC 10.8*  HGB 8.0*  HCT 27.0*  MCV 101.5*  PLT 314   Cardiac Enzymes: No results for input(s): CKTOTAL, CKMB, CKMBINDEX, TROPONINI in  the last 168 hours. BNP: BNP (last 3 results) Recent Labs    12/26/21 0117 01/08/22 1725 02/12/22 1419  BNP 343.6* 376.1* 361.8*    ProBNP (last 3 results) No results for input(s): PROBNP in the last 8760 hours.  CBG: Recent Labs  Lab 03/01/22 1047 03/01/22 1616 03/01/22 2107 03/02/22 0549 03/02/22 1101  GLUCAP 255* 218* 238* 173* 233*       Signed:  Domenic Polite MD.  [facility] 03/06/2022, 2:27 PM

## 2022-03-12 ENCOUNTER — Ambulatory Visit (INDEPENDENT_AMBULATORY_CARE_PROVIDER_SITE_OTHER): Payer: Medicare Other | Admitting: *Deleted

## 2022-03-12 DIAGNOSIS — E785 Hyperlipidemia, unspecified: Secondary | ICD-10-CM

## 2022-03-12 DIAGNOSIS — R600 Localized edema: Secondary | ICD-10-CM

## 2022-03-12 DIAGNOSIS — N1832 Chronic kidney disease, stage 3b: Secondary | ICD-10-CM

## 2022-03-12 DIAGNOSIS — I509 Heart failure, unspecified: Secondary | ICD-10-CM

## 2022-03-12 DIAGNOSIS — E1169 Type 2 diabetes mellitus with other specified complication: Secondary | ICD-10-CM

## 2022-03-12 NOTE — Chronic Care Management (AMB) (Signed)
Chronic Care Management    Clinical Social Work Note  03/12/2022 Name: Katelyn Mahoney MRN: [id number] DOB: 1934/10/30  Katelyn Mahoney is a 88 y.o. year old female who is a primary care patient of Ann Held, DO. The CCM team was consulted to assist the patient with chronic disease management and/or care coordination needs related to: [employer], Level of Care Concerns, Hospice/Palliative Care Services Education, and Caregiver Stress.   Engaged with patient by telephone for follow up visit in response to provider referral for social work chronic care management and care coordination services.   Consent to Services:  The patient was given information about Chronic Care Management services, agreed to services, and gave verbal consent prior to initiation of services.  Please see initial visit note for detailed documentation.   Patient agreed to services and consent obtained.   Assessment: Review of patient past medical history, allergies, medications, and health status, including review of relevant consultants reports was performed today as part of a comprehensive evaluation and provision of chronic care management and care coordination services.     SDOH (Social Determinants of Health) assessments and interventions performed:    Advanced Directives Status: Not addressed in this encounter.  CCM Care Plan  Allergies  Allergen Reactions   Bupropion Hcl Rash    Palmar rash   Penicillins Rash    rash   Citalopram Hydrobromide Other (See Comments)    cough   Metformin And Related Diarrhea   Blue Dyes (Parenteral) Rash   Cobalt Rash   Gold-Containing Drug Products Rash   Nickel Rash   Zinc Rash    Outpatient Encounter Medications as of 03/12/2022  Medication Sig Note   acetaminophen (TYLENOL) 650 MG CR tablet 650 mg every 8 (eight) hours.    acetaZOLAMIDE (DIAMOX) 250 MG tablet Take 1 tablet (250 mg total) by mouth 2 (two) times daily.    albuterol (VENTOLIN  HFA) 108 (90 Base) MCG/ACT inhaler Inhale 2 puffs into the lungs every 6 (six) hours as needed for wheezing or shortness of breath.    apixaban (ELIQUIS) 2.5 MG TABS tablet Take 1 tablet (2.5 mg total) by mouth 2 (two) times daily.    atorvastatin (LIPITOR) 20 MG tablet TAKE 1/2 TABLET ON MONDAY, WEDNESDAY, FRIDAY AND      SUNDAYS (Patient taking differently: Take 20 mg by mouth 4 (four) times a week. Take 1/2 (10 mg) tablet on Monday, Wednesday, Friday, and Sunday)    Bilberry, Vaccinium myrtillus, (BILBERRY PO) Take 1 tablet by mouth daily.    diltiazem (CARDIZEM CD) 120 MG 24 hr capsule Take 1 capsule (120 mg total) by mouth daily.    fluocinonide cream (LIDEX) 9.67 % Apply 1 application topically daily as needed (breakouts).    gabapentin (NEURONTIN) 100 MG capsule Take 1 capsule (100 mg total) by mouth 2 (two) times daily.    glipiZIDE (GLUCOTROL XL) 10 MG 24 hr tablet TAKE 1 TABLET EVERY MORNING (Patient not taking: Reported on 02/12/2022)    Glucosamine HCl (GLUCOSAMINE PO) Take 1 tablet by mouth daily.    insulin glargine, 1 Unit Dial, (TOUJEO SOLOSTAR) 300 UNIT/ML Solostar Pen Inject 25 Units into the skin daily.    insulin lispro (HUMALOG KWIKPEN) 100 UNIT/ML KwikPen Inject 6 Units into the skin with breakfast, with lunch, and with evening meal. Inject 7-9 units under the skin three times daily before meals. E11.65 02/12/2022: Pt had 12 units   Insulin Pen Needle 32G X 4 MM MISC Use on insulin  pens 4 times a day    ipratropium-albuterol (DUONEB) 0.5-2.5 (3) MG/3ML SOLN Take 3 mLs by nebulization every 6 (six) hours as needed.    iron polysaccharides (NU-IRON) 150 MG capsule Take 1 capsule (150 mg total) by mouth daily.    lactulose, encephalopathy, (CHRONULAC) 10 GM/15ML SOLN Take 15 mLs (10 g total) by mouth daily as needed.    Loratadine 10 MG CAPS Take 10 mg by mouth daily.    metoprolol tartrate (LOPRESSOR) 25 MG tablet Take 1 tablet (25 mg total) by mouth 2 (two) times daily.     multivitamin-lutein (OCUVITE-LUTEIN) CAPS capsule Take 1 capsule by mouth daily.    NONFORMULARY OR COMPOUNDED ITEM Lift chair  #1   Dx osteoarthritis knees    OneTouch Delica Lancets 81E MISC Use to test blood sugar once daily- E11.65    ONETOUCH VERIO test strip USE TO TEST BLOOD SUGAR    TWICE A DAY    OZEMPIC, 1 MG/DOSE, 4 MG/3ML SOPN INJECT 1MG  SUBCUTANEOUSLY  WEEKLY (EVERY 7 DAYS) (Patient taking differently: Inject 1 mg into the skin every Saturday.)    pantoprazole (PROTONIX) 40 MG tablet Take 1 tablet (40 mg total) by mouth daily.    potassium chloride SA (KLOR-CON M) 20 MEQ tablet Take 1 tablet (20 mEq total) by mouth daily.    sertraline (ZOLOFT) 100 MG tablet TAKE 1 TABLET DAILY (Patient taking differently: Take 100 mg by mouth daily.)    sodium chloride (OCEAN) 0.65 % SOLN nasal spray Place 1 spray into both nostrils as needed for congestion.    Tiotropium Bromide Monohydrate (SPIRIVA RESPIMAT) 2.5 MCG/ACT AERS Inhale 1 puff into the lungs daily.    torsemide 60 MG TABS Take 60 mg by mouth 2 (two) times daily.    No facility-administered encounter medications on file as of 03/12/2022.    Patient Active Problem List   Diagnosis Date Noted   Acute hepatic encephalopathy 02/26/2022   UTI (urinary tract infection) 02/21/2022   Paroxysmal atrial fibrillation (HCC)    Atrial fibrillation status post cardioversion [facility])    Cardiorenal syndrome    Anemia    Pulmonary nodule 02/14/2022   Chronic atrial fibrillation with RVR (HCC) 02/13/2022   Chronic kidney disease (CKD), stage IV (severe) (HCC) 02/13/2022   CHF (congestive heart failure) ([place]) 02/12/2022   Acute on chronic respiratory failure with hypoxia ([place]) 02/08/2022   Gastrointestinal hemorrhage    Persistent atrial fibrillation (HCC)    Acute exacerbation of CHF (congestive heart failure) ([place]) 01/09/2022   Atrial fibrillation with RVR (HCC)    Acute congestive heart failure ([place])    Urinary tract infection 12/13/2021    AKI (acute kidney injury) ([place]) 12/13/2021   At risk for sepsis 12/13/2021   Primary osteoarthritis of both knees 03/09/2021   Urinary frequency 03/09/2021   Depression, major, single episode, in partial remission ([place]) 03/09/2021   Edema of lower extremity 08/20/2017   Hyperlipidemia 08/20/2017   CKD stage G3b/A1, GFR 30-44 and albumin creatinine ratio <30 mg/g (HCC) 02/27/2017   Allergic urticaria 08/02/2016   Fungal dermatitis 11/08/2015   Right leg pain 10/14/2015   Goals of care, counseling/discussion 11/09/2014   Dermatophytosis of nail 09/24/2013   Arthritis    Claw toe (acquired) 04/17/2013   Hammer toe, acquired 04/17/2013   Spherocytosis, hereditary ([place]) 08/30/2011   Type 2 diabetes mellitus with hyperlipidemia ([place]) 10/17/2010   Obesity, Class III, BMI 40-49.9 (morbid obesity) ([place]) 03/19/2008   Primary hypertension 03/19/2008   ROTATOR CUFF  SPRAIN AND STRAIN 03/19/2008   Hyperlipidemia associated with type 2 diabetes mellitus ([place]) 09/05/2007   Depression 09/05/2007   ROSACEA 05/19/2007    Conditions to be addressed/monitored: HTN, HLD, and DMII; Level of Care Concerns, ADL/IADL Limitations, Limited Access to Caregiver, and Lacks Knowledge of [employer].  Care Plan : LCSW Plan of Care  Updates made by Francis Gaines, LCSW since 03/12/2022 12:00 AM     Problem: Obtain Skilled Nursing Placement for [employer]. Resolved 03/12/2022  Priority: High     Goal: Obtain Skilled Nursing Placement for [employer]. Completed 03/12/2022  Start Date: 02/09/2022  Expected End Date: 03/12/2022  This Visit's Progress: On track  Recent Progress: On track  Priority: High  Note:   Current Barriers:  Chronic Disease Management Support, Education, Resources, Referrals, Advocacy, and Care Coordination Needs related to Acute on Chronic Respiratory Failure with Hypoxia, Hypertension, Acute Exacerbation of Congestive Heart Failure,  Type II Diabetes Mellitus, Stage III Chronic Kidney Disease, and Major Depression, Single Episode, in Partial Remission. Inability to consistently perform ADL's/IADL's independently. Clinical Goal(s):  Patient will work with LCSW, in an effort to identify and address any acute and chronic care coordination needs related to the self-health management of Acute on Chronic Respiratory Failure with Hypoxia, Hypertension, Acute Exacerbation of Congestive Heart Failure, Type II Diabetes Mellitus, Stage III Chronic Kidney Disease, and Major Depression, Single Episode, in Partial Remission. Interventions: Inter-disciplinary care team collaboration (see longitudinal plan of care). Collaboration with Primary Care Physician, Dr. Roma Schanz regarding development and update of comprehensive plan of care as evidenced by provider attestation and co-signature. Clinical Interventions:  Collaboration with Primary Care Physician, Dr. Roma Schanz regarding bleeding and draining pressure injury/decubitus area in the middle of buttock.     Patient Goals/Self-Care Activities: Confirmed discharge from hospital on 03/02/2022, returning home with Hospice Services, through [facility] [phone]). ~ LCSW collaboration with Drue Dun, [place] Nurse with Manufacturing [employer] [phone]), to confirm order for [place], as well as to ensure that she is aware that patient is requesting for Primary Care Physician, Dr. Roma Schanz to continue to be her primary care provider. Continue to receive [place], Physical Therapy, Occupational Therapy, Financial risk analyst, and Social Work Hospital doctor, through [facility].    ~ [place] with Tommi Rumps, Representative with [facility] [phone]), to confirm orders for [place], Physical Therapy, Occupational Therapy, Financial risk analyst, and Social Work [employer]. Receive 4 Liters of Home Oxygen  (Portable Tank and Concentrator), Hospital Bed, Wheelchair, and a Bedside Table for home use, through [facility].   ~ LCSW collaboration with Corene Cornea, Representative with [place] [phone]), to confirm delivery of [place] Hospital Bed, Wheelchair, and Bedside Table. Collaboration with Primary Care Physician, Dr. Roma Schanz regarding bleeding and draining pressure injury/decubitus area in the middle of buttock.    Contact LCSW directly (# [phone]), if you have questions, need assistance, or if additional social work needs are identified in the near future.   No Follow-Up Required.     Learned Licensed Clinical Social Worker [facility] [employer] [phone]

## 2022-03-12 NOTE — Patient Instructions (Signed)
Visit Information ? ?Thank you for taking time to visit with me today. Please don't hesitate to contact me if I can be of assistance to you before our next scheduled telephone appointment. ? ?Following are the goals we discussed today:  ?Patient Goals/Self-Care Activities: ?Confirmed discharge from hospital on 03/02/2022, returning home with Hospice Services, through [facility] (# [phone]). ?~ LCSW collaboration with Drue Dun, [place] Nurse with Manufacturing engineer [phone]), to confirm order for [place], as well as to ensure that she is aware that patient is requesting for Primary Care Physician, Dr. Roma Schanz to continue to be her primary care provider. ?Continue to receive [place], Physical Therapy, Occupational Therapy, Financial risk analyst, and Social Work Hospital doctor, through [facility].    ?~ LCSW collaboration with Tommi Rumps, Representative with [facility] [phone]), to confirm orders for [place], Physical Therapy, Occupational Therapy, Financial risk analyst, and Social Work [employer]. ?Receive 4 Liters of Home Oxygen (Portable Tank and Concentrator), Hospital Bed, Wheelchair, and a Bedside Table for home use, through [facility].   ?~ LCSW collaboration with Corene Cornea, Representative with [place] [phone]), to confirm delivery of [place] Hospital Bed, Wheelchair, and Bedside Table. ?Collaboration with Primary Care Physician, Dr. Roma Schanz regarding bleeding and draining pressure injury/decubitus area in the middle of buttock.    ?Contact LCSW directly (# [phone]), if you have questions, need assistance, or if additional social work needs are identified in the near future.   ?No Follow-Up Required. ? ?Please call the care guide team at [phone] if you need to cancel or reschedule your appointment.  ? ?If you are experiencing a Mental Health or [place] or need someone to  talk to, please call the Suicide and Crisis Lifeline: 988 ?call the [place] National Suicide Prevention Lifeline: 430 357 4935 or TTY: (802) 020-9924 TTY 303 419 3196) to talk to a trained counselor ?call 1-800-273-TALK (toll free, 24 hour hotline) ?go to [facility] [address] [place] [phone]) ?call the [facility]: [phone] ?call 911  ? ?Patient verbalizes understanding of instructions and care plan provided today and agrees to view in [place]. Active MyChart status confirmed with patient.   ? ?Nat Christen LCSW ?Licensed Clinical Social Worker ?[place] ?[phone]  ?

## 2022-03-13 ENCOUNTER — Ambulatory Visit: Payer: Medicare Other

## 2022-03-13 NOTE — Progress Notes (Deleted)
Subjective:   Katelyn Mahoney is a 88 y.o. female who presents for Medicare Annual (Subsequent) preventive examination.  Review of Systems    ***       Objective:    There were no vitals filed for this visit. There is no height or weight on file to calculate BMI.  Advanced Directives 02/13/2022 02/13/2022 02/13/2022 02/12/2022 01/15/2022 01/12/2022 12/12/2021  Does Patient Have a Medical Advance Directive? Yes - Yes Yes - Yes No  Type of Advance Directive Living will Healthcare Power of Attorney - Healthcare Power of [place];Living will;Out of facility DNR (pink MOST or yellow form) Healthcare Power of [place];Living will Healthcare Power of [place];Living will -  Does patient want to make changes to medical advance directive? - No - Patient declined - No - Patient declined - - -  Copy of Healthcare Power of Attorney in Chart? - - - No - copy requested No - copy requested No - copy requested -  Would patient like information on creating a medical advance directive? - - - No - Patient declined No - Patient declined No - Patient declined No - Patient declined    Current Medications (verified) Outpatient Encounter Medications as of 03/15/2022  Medication Sig   acetaminophen (TYLENOL) 650 MG CR tablet 650 mg every 8 (eight) hours.   acetaZOLAMIDE (DIAMOX) 250 MG tablet Take 1 tablet (250 mg total) by mouth 2 (two) times daily.   albuterol (VENTOLIN HFA) 108 (90 Base) MCG/ACT inhaler Inhale 2 puffs into the lungs every 6 (six) hours as needed for wheezing or shortness of breath.   apixaban (ELIQUIS) 2.5 MG TABS tablet Take 1 tablet (2.5 mg total) by mouth 2 (two) times daily.   atorvastatin (LIPITOR) 20 MG tablet TAKE 1/2 TABLET ON MONDAY, WEDNESDAY, FRIDAY AND      SUNDAYS (Patient taking differently: Take 20 mg by mouth 4 (four) times a week. Take 1/2 (10 mg) tablet on Monday, Wednesday, Friday, and Sunday)   Bilberry, Vaccinium myrtillus, (BILBERRY PO) Take 1 tablet by mouth daily.    diltiazem (CARDIZEM CD) 120 MG 24 hr capsule Take 1 capsule (120 mg total) by mouth daily.   fluocinonide cream (LIDEX) 0.05 % Apply 1 application topically daily as needed (breakouts).   gabapentin (NEURONTIN) 100 MG capsule Take 1 capsule (100 mg total) by mouth 2 (two) times daily.   glipiZIDE (GLUCOTROL XL) 10 MG 24 hr tablet TAKE 1 TABLET EVERY MORNING (Patient not taking: Reported on 02/12/2022)   Glucosamine HCl (GLUCOSAMINE PO) Take 1 tablet by mouth daily.   insulin glargine, 1 Unit Dial, (TOUJEO SOLOSTAR) 300 UNIT/ML Solostar Pen Inject 25 Units into the skin daily.   insulin lispro (HUMALOG KWIKPEN) 100 UNIT/ML KwikPen Inject 6 Units into the skin with breakfast, with lunch, and with evening meal. Inject 7-9 units under the skin three times daily before meals. E11.65   Insulin Pen Needle 32G X 4 MM MISC Use on insulin pens 4 times a day   ipratropium-albuterol (DUONEB) 0.5-2.5 (3) MG/3ML SOLN Take 3 mLs by nebulization every 6 (six) hours as needed.   iron polysaccharides (NU-IRON) 150 MG capsule Take 1 capsule (150 mg total) by mouth daily.   lactulose, encephalopathy, (CHRONULAC) 10 GM/15ML SOLN Take 15 mLs (10 g total) by mouth daily as needed.   Loratadine 10 MG CAPS Take 10 mg by mouth daily.   metoprolol tartrate (LOPRESSOR) 25 MG tablet Take 1 tablet (25 mg total) by mouth 2 (two) times daily.   multivitamin-lutein (  OCUVITE-LUTEIN) CAPS capsule Take 1 capsule by mouth daily.   NONFORMULARY OR COMPOUNDED ITEM Lift chair  #1   Dx osteoarthritis knees   OneTouch Delica Lancets 33G MISC Use to test blood sugar once daily- E11.65   ONETOUCH VERIO test strip USE TO TEST BLOOD SUGAR    TWICE A DAY   OZEMPIC, 1 MG/DOSE, 4 MG/3ML SOPN INJECT 1MG  SUBCUTANEOUSLY  WEEKLY (EVERY 7 DAYS) (Patient taking differently: Inject 1 mg into the skin every Saturday.)   pantoprazole (PROTONIX) 40 MG tablet Take 1 tablet (40 mg total) by mouth daily.   potassium chloride SA (KLOR-CON M) 20 MEQ tablet Take  1 tablet (20 mEq total) by mouth daily.   sertraline (ZOLOFT) 100 MG tablet TAKE 1 TABLET DAILY (Patient taking differently: Take 100 mg by mouth daily.)   sodium chloride (OCEAN) 0.65 % SOLN nasal spray Place 1 spray into both nostrils as needed for congestion.   Tiotropium Bromide Monohydrate (SPIRIVA RESPIMAT) 2.5 MCG/ACT AERS Inhale 1 puff into the lungs daily.   torsemide 60 MG TABS Take 60 mg by mouth 2 (two) times daily.   No facility-administered encounter medications on file as of 03/15/2022.    Allergies (verified) Bupropion hcl, Penicillins, Citalopram hydrobromide, Metformin and related, Blue dyes (parenteral), Cobalt, Gold-containing drug products, Nickel, and Zinc   History: Past Medical History:  Diagnosis Date   Allergy    Anxiety    Arthritis    Arthritis    Callus    Depression    Diabetes mellitus without complication (HCC)    Hyperlipidemia    Hypertension    Past Surgical History:  Procedure Laterality Date   ABDOMINAL HYSTERECTOMY     CARDIOVERSION N/A 02/13/2022   Procedure: CARDIOVERSION;  Surgeon: Elder Negus, MD;  Location: [facility] ENDOSCOPY;  Service: Cardiovascular;  Laterality: N/A;   CHOLECYSTECTOMY     COLONOSCOPY WITH PROPOFOL N/A 01/12/2022   Procedure: COLONOSCOPY WITH PROPOFOL;  Surgeon: Jeani Hawking, MD;  Location: [facility] ENDOSCOPY;  Service: Endoscopy;  Laterality: N/A;   ESOPHAGOGASTRODUODENOSCOPY N/A 01/10/2022   Procedure: ESOPHAGOGASTRODUODENOSCOPY (EGD);  Surgeon: Jeani Hawking, MD;  Location: [facility] ENDOSCOPY;  Service: Endoscopy;  Laterality: N/A;   HEMOSTASIS CLIP PLACEMENT  01/12/2022   Procedure: HEMOSTASIS CLIP PLACEMENT;  Surgeon: Jeani Hawking, MD;  Location: [facility] ENDOSCOPY;  Service: Endoscopy;;   HOT HEMOSTASIS N/A 01/10/2022   Procedure: HOT HEMOSTASIS (ARGON PLASMA COAGULATION/BICAP);  Surgeon: Jeani Hawking, MD;  Location: [facility] ENDOSCOPY;  Service: Endoscopy;  Laterality: N/A;   HOT HEMOSTASIS N/A 01/12/2022   Procedure: HOT HEMOSTASIS  (ARGON PLASMA COAGULATION/BICAP);  Surgeon: Jeani Hawking, MD;  Location: [facility] ENDOSCOPY;  Service: Endoscopy;  Laterality: N/A;   SPLENECTOMY     Family History  Problem Relation Age of Onset   Cancer Mother    Diabetes Mother    Cancer Father    Diabetes Father    Cancer Sister    Diabetes Sister    Cancer Brother    Brain cancer Brother    Diabetes Brother    Diabetes Maternal Grandmother    Diabetes Paternal Grandmother    Cancer - Lung Brother    Diabetes Brother    Social History   Socioeconomic History   Marital status: Widowed    Spouse name: Not on file   Number of children: 1   Years of education: 17   Highest education level: 12th grade  Occupational History   Not on file  Tobacco Use   Smoking status: Never  Passive exposure: Never   Smokeless tobacco: Never  Vaping Use   Vaping Use: Never used  Substance and Sexual Activity   Alcohol use: No    Alcohol/week: 0.0 standard drinks   Drug use: No   Sexual activity: Never    Birth control/protection: Abstinence  Other Topics Concern   Not on file  Social History Narrative   Not on file   Social Determinants of Health   Financial Resource Strain: Low Risk    Difficulty of Paying Living Expenses: Not very hard  Food Insecurity: No Food Insecurity   Worried About Programme researcher, broadcasting/film/video in the Last Year: Never true   Ran Out of Food in the Last Year: Never true  Transportation Needs: No Transportation Needs   Lack of Transportation (Medical): No   Lack of Transportation (Non-Medical): No  Physical Activity: Inactive   Days of Exercise per Week: 0 days   Minutes of Exercise per Session: 0 min  Stress: No Stress Concern Present   Feeling of Stress : Only a little  Social Connections: Moderately Isolated   Frequency of Communication with Friends and Family: More than three times a week   Frequency of Social Gatherings with Friends and Family: More than three times a week   Attends Religious Services: 1 to  4 times per year   Active Member of [employer] or Organizations: No   Attends Banker Meetings: Never   Marital Status: Widowed    Tobacco Counseling Counseling given: Not Answered   Clinical Intake:                 Diabetic?yes Nutrition Risk Assessment:  Has the patient had any N/V/D within the last 2 months?  {YES/NO:21197} Does the patient have any non-healing wounds?  {YES/NO:21197} Has the patient had any unintentional weight loss or weight gain?  {YES/NO:21197}  Diabetes:  Is the patient diabetic?  {YES/NO:21197} If diabetic, was a CBG obtained today?  {YES/NO:21197} Did the patient bring in their glucometer from home?  {YES/NO:21197} How often do you monitor your CBG's? ***.   Financial Strains and Diabetes Management:  Are you having any financial strains with the device, your supplies or your medication? {YES/NO:21197}.  Does the patient want to be seen by Chronic Care Management for management of their diabetes?  {YES/NO:21197} Would the patient like to be referred to a Nutritionist or for Diabetic Management?  {YES/NO:21197}  Diabetic Exams:  {Diabetic Eye Exam:2101801} {Diabetic Foot Exam:2101802}          Activities of Daily Living In your present state of health, do you have any difficulty performing the following activities: 02/13/2022 02/12/2022  Hearing? N N  Vision? N N  Difficulty concentrating or making decisions? N N  Walking or climbing stairs? Y Y  Comment - Unsteady Balance/Gait  Dressing or bathing? Y Y  Comment - Unsteady Balance/Gait  Doing errands, shopping? N N  Preparing Food and eating ? - N  Using the Toilet? - N  In the past six months, have you accidently leaked urine? - N  Do you have problems with loss of bowel control? - N  Managing your Medications? - N  Managing your Finances? - N  Housekeeping or managing your Housekeeping? - N  Some recent data might be hidden    Patient Care Team: Zola Button,  Grayling Congress, DO as PCP - General (Family Medicine) Jethro Bolus, MD as Consulting Physician (Ophthalmology) Samson Frederic, MD as Consulting Physician (Orthopedic Surgery) System,  Provider Not In as Consulting Physician (Dentistry) Donzetta Starch, MD as Consulting Physician (Dermatology) Reather Littler, MD as Consulting Physician (Endocrinology) Arita Miss, MD as Attending Physician (Nephrology) Lysle Dingwall, DPM as Consulting Physician (Podiatry) Lysle Dingwall, DPM (Podiatry)  Indicate any recent Medical Services you may have received from other than [facility] providers in the past year (date may be approximate).     Assessment:   This is a routine wellness examination for [place].  Hearing/Vision screen No results found.  Dietary issues and exercise activities discussed:     Goals Addressed   None    Depression Screen PHQ 2/9 Scores 02/12/2022 09/11/2021 03/09/2021 03/09/2021 03/09/2021 03/01/2020 01/10/2018  PHQ - 2 Score 0 2 0 2 0 0 1  PHQ- 9 Score - 6 - 5 - - -    Fall Risk Fall Risk  02/12/2022 03/09/2021 03/09/2021 03/01/2020 01/10/2018  Falls in the past year? 1 0 0 0 No  Number falls in past yr: 1 0 0 0 -  Injury with Fall? 1 0 0 0 -  Risk for fall due to : History of fall(s);Impaired balance/gait;Impaired mobility;Mental status change - - - -  Risk for fall due to: Comment - - - - -  Follow up Falls evaluation completed;Education provided;Falls prevention discussed Falls prevention discussed Falls evaluation completed Education provided;Falls prevention discussed -    FALL RISK PREVENTION PERTAINING TO THE HOME:  Any stairs in or around the home? {YES/NO:21197} If so, are there any without handrails? {YES/NO:21197} Home free of loose throw rugs in walkways, pet beds, electrical cords, etc? {YES/NO:21197} Adequate lighting in your home to reduce risk of falls? {YES/NO:21197}  ASSISTIVE DEVICES UTILIZED TO PREVENT FALLS:  Life alert? {YES/NO:21197} Use of a cane, walker or w/c?  {YES/NO:21197} Grab bars in the bathroom? {YES/NO:21197} Shower chair or bench in shower? {YES/NO:21197} Elevated toilet seat or a handicapped toilet? {YES/NO:21197}  TIMED UP AND GO:  Was the test performed? {YES/NO:21197}.  Length of time to ambulate 10 feet: *** sec.   {Appearance of WUJW:1191478}  Cognitive Function:        Immunizations Immunization History  Administered Date(s) Administered   Fluad Quad(high Dose 65+) 09/03/2019, 09/11/2021   Influenza Split 10/03/2011, 09/30/2012   Influenza Whole 09/24/2008, 09/27/2009, 09/15/2010   Influenza, High Dose Seasonal PF 10/02/2016, 09/15/2018, 10/20/2020   Influenza,inj,Quad PF,6+ Mos 09/24/2013, 09/09/2014, 09/20/2015, 09/23/2017   PFIZER(Purple Top)SARS-COV-2 Vaccination 01/20/2020, 02/10/2020, 10/19/2020, 08/03/2021   Pneumococcal Conjugate-13 11/09/2014   Pneumococcal Polysaccharide-23 01/17/2012, 08/19/2017   Td 04/07/2014   Zoster, Live 03/22/2008    TDAP status: Up to date  Flu Vaccine status: Up to date  Pneumococcal vaccine status: Up to date  {Covid-19 vaccine status:2101808}  Qualifies for Shingles Vaccine? Yes   Zostavax completed No   {Shingrix Completed?:2101804}  Screening Tests Health Maintenance  Topic Date Due   Zoster Vaccines- Shingrix (1 of 2) Never done   COVID-19 Vaccine (5 - Booster for Pfizer series) 09/28/2021   HEMOGLOBIN A1C  08/02/2022   FOOT EXAM  09/11/2022   OPHTHALMOLOGY EXAM  10/24/2022   TETANUS/TDAP  04/07/2024   Pneumonia Vaccine 61+ Years old  Completed   INFLUENZA VACCINE  Completed   DEXA SCAN  Completed   HPV VACCINES  Aged Out    Health Maintenance  Health Maintenance Due  Topic Date Due   Zoster Vaccines- Shingrix (1 of 2) Never done   COVID-19 Vaccine (5 - Booster for Pfizer series) 09/28/2021    Colorectal cancer screening: No  longer required.   {Mammogram status:21018020}  Bone Density status: Completed 03/23/21. Results reflect: Bone density  results: OSTEOPOROSIS. Repeat every 2 years.  Lung Cancer Screening: (Low Dose CT Chest recommended if Age 67-80 years, 30 pack-year currently smoking OR have quit w/in 15years.) does not qualify.   Lung Cancer Screening Referral: N/A  Additional Screening:  Hepatitis C Screening: does not qualify; Completed Aged out  Vision Screening: Recommended annual ophthalmology exams for early detection of glaucoma and other disorders of the eye. Is the patient up to date with their annual eye exam?  {YES/NO:21197} Who is the provider or what is the name of the office in which the patient attends annual eye exams? *** If pt is not established with a provider, would they like to be referred to a provider to establish care? {YES/NO:21197}.   Dental Screening: Recommended annual dental exams for proper oral hygiene  Community Resource Referral / Chronic Care Management: CRR required this visit?  {YES/NO:21197}  CCM required this visit?  {YES/NO:21197}     Plan:     I have personally reviewed and noted the following in the patients chart:   Medical and social history Use of alcohol, tobacco or illicit drugs  Current medications and supplements including opioid prescriptions.  Functional ability and status Nutritional status Physical activity Advanced directives List of other physicians Hospitalizations, surgeries, and ER visits in previous 12 months Vitals Screenings to include cognitive, depression, and falls Referrals and appointments  In addition, I have reviewed and discussed with patient certain preventive protocols, quality metrics, and best practice recommendations. A written personalized care plan for preventive services as well as general preventive health recommendations were provided to patient.     Salomon Mast Chrysta Fulcher, CMA   03/13/2022   Nurse Notes: ***

## 2022-03-13 NOTE — Progress Notes (Deleted)
? ?Subjective:  ? Katelyn Mahoney is a 88 y.o. female who presents for Medicare Annual (Subsequent) preventive examination. ? ?I connected with  Keierra Nudo on 03/13/22 by a audio enabled telemedicine application and verified that I am speaking with the correct person using two identifiers. ? ?Patient Location: Home ? ?Provider Location: Office/Clinic ? ?I discussed the limitations of evaluation and management by telemedicine. The patient expressed understanding and agreed to proceed.  ? ?Review of Systems    ?*** ?  ? ?   ?Objective:  ?  ?There were no vitals filed for this visit. ?There is no height or weight on file to calculate BMI. ? ?Advanced Directives 02/13/2022 02/13/2022 02/13/2022 02/12/2022 01/15/2022 01/12/2022 12/12/2021  ?Does Patient Have a Medical Advance Directive? Yes - Yes Yes - Yes No  ?Type of Advance Directive Living will [place];Living will;Out of facility DNR (pink MOST or yellow form) [place];Living will [place];Living will -  ?Does patient want to make changes to medical advance directive? - No - Patient declined - No - Patient declined - - -  ?Copy of [place] in Chart? - - - No - copy requested No - copy requested No - copy requested -  ?Would patient like information on creating a medical advance directive? - - - No - Patient declined No - Patient declined No - Patient declined No - Patient declined  ? ? ?Current Medications (verified) ?Outpatient Encounter Medications as of 03/13/2022  ?Medication Sig  ? acetaminophen (TYLENOL) 650 MG CR tablet 650 mg every 8 (eight) hours.  ? acetaZOLAMIDE (DIAMOX) 250 MG tablet Take 1 tablet (250 mg total) by mouth 2 (two) times daily.  ? albuterol (VENTOLIN HFA) 108 (90 Base) MCG/ACT inhaler Inhale 2 puffs into the lungs every 6 (six) hours as needed for wheezing or shortness of breath.  ? apixaban (ELIQUIS) 2.5 MG TABS tablet Take 1  tablet (2.5 mg total) by mouth 2 (two) times daily.  ? atorvastatin (LIPITOR) 20 MG tablet TAKE 1/2 TABLET ON MONDAY, WEDNESDAY, FRIDAY AND      SUNDAYS (Patient taking differently: Take 20 mg by mouth 4 (four) times a week. Take 1/2 (10 mg) tablet on Monday, Wednesday, Friday, and Sunday)  ? Bilberry, Vaccinium myrtillus, (BILBERRY PO) Take 1 tablet by mouth daily.  ? diltiazem (CARDIZEM CD) 120 MG 24 hr capsule Take 1 capsule (120 mg total) by mouth daily.  ? fluocinonide cream (LIDEX) 0.25 % Apply 1 application topically daily as needed (breakouts).  ? gabapentin (NEURONTIN) 100 MG capsule Take 1 capsule (100 mg total) by mouth 2 (two) times daily.  ? glipiZIDE (GLUCOTROL XL) 10 MG 24 hr tablet TAKE 1 TABLET EVERY MORNING (Patient not taking: Reported on 02/12/2022)  ? Glucosamine HCl (GLUCOSAMINE PO) Take 1 tablet by mouth daily.  ? insulin glargine, 1 Unit Dial, (TOUJEO SOLOSTAR) 300 UNIT/ML Solostar Pen Inject 25 Units into the skin daily.  ? insulin lispro (HUMALOG KWIKPEN) 100 UNIT/ML KwikPen Inject 6 Units into the skin with breakfast, with lunch, and with evening meal. Inject 7-9 units under the skin three times daily before meals. E11.65  ? Insulin Pen Needle 32G X 4 MM MISC Use on insulin pens 4 times a day  ? ipratropium-albuterol (DUONEB) 0.5-2.5 (3) MG/3ML SOLN Take 3 mLs by nebulization every 6 (six) hours as needed.  ? iron polysaccharides (NU-IRON) 150 MG capsule Take 1 capsule (150 mg total) by  mouth daily.  ? lactulose, encephalopathy, (CHRONULAC) 10 GM/15ML SOLN Take 15 mLs (10 g total) by mouth daily as needed.  ? Loratadine 10 MG CAPS Take 10 mg by mouth daily.  ? metoprolol tartrate (LOPRESSOR) 25 MG tablet Take 1 tablet (25 mg total) by mouth 2 (two) times daily.  ? multivitamin-lutein (OCUVITE-LUTEIN) CAPS capsule Take 1 capsule by mouth daily.  ? NONFORMULARY OR COMPOUNDED ITEM Lift chair  #1   Dx osteoarthritis knees  ? OneTouch Delica Lancets 53G MISC Use to test blood sugar once daily-  E11.65  ? ONETOUCH VERIO test strip USE TO TEST BLOOD SUGAR    TWICE A DAY  ? OZEMPIC, 1 MG/DOSE, 4 MG/3ML SOPN INJECT 1MG  SUBCUTANEOUSLY  WEEKLY (EVERY 7 DAYS) (Patient taking differently: Inject 1 mg into the skin every Saturday.)  ? pantoprazole (PROTONIX) 40 MG tablet Take 1 tablet (40 mg total) by mouth daily.  ? potassium chloride SA (KLOR-CON M) 20 MEQ tablet Take 1 tablet (20 mEq total) by mouth daily.  ? sertraline (ZOLOFT) 100 MG tablet TAKE 1 TABLET DAILY (Patient taking differently: Take 100 mg by mouth daily.)  ? sodium chloride (OCEAN) 0.65 % SOLN nasal spray Place 1 spray into both nostrils as needed for congestion.  ? Tiotropium Bromide Monohydrate (SPIRIVA RESPIMAT) 2.5 MCG/ACT AERS Inhale 1 puff into the lungs daily.  ? torsemide 60 MG TABS Take 60 mg by mouth 2 (two) times daily.  ? ?No facility-administered encounter medications on file as of 03/13/2022.  ? ? ?Allergies (verified) ?Bupropion hcl, Penicillins, Citalopram hydrobromide, Metformin and related, Blue dyes (parenteral), Cobalt, Gold-containing drug products, Nickel, and Zinc  ? ?History: ?Past Medical History:  ?Diagnosis Date  ? Allergy   ? Anxiety   ? Arthritis   ? Arthritis   ? Callus   ? Depression   ? Diabetes mellitus without complication ([place])   ? Hyperlipidemia   ? Hypertension   ? ?Past Surgical History:  ?Procedure Laterality Date  ? ABDOMINAL HYSTERECTOMY    ? CARDIOVERSION N/A 02/13/2022  ? Procedure: CARDIOVERSION;  Surgeon: Nigel Mormon, MD;  Location: [facility];  Service: Cardiovascular;  Laterality: N/A;  ? CHOLECYSTECTOMY    ? COLONOSCOPY WITH PROPOFOL N/A 01/12/2022  ? Procedure: COLONOSCOPY WITH PROPOFOL;  Surgeon: Carol Ada, MD;  Location: [facility];  Service: Endoscopy;  Laterality: N/A;  ? ESOPHAGOGASTRODUODENOSCOPY N/A 01/10/2022  ? Procedure: ESOPHAGOGASTRODUODENOSCOPY (EGD);  Surgeon: Carol Ada, MD;  Location: [facility];  Service: Endoscopy;  Laterality: N/A;  ? HEMOSTASIS CLIP PLACEMENT   01/12/2022  ? Procedure: HEMOSTASIS CLIP PLACEMENT;  Surgeon: Carol Ada, MD;  Location: [place];  Service: Endoscopy;;  ? HOT HEMOSTASIS N/A 01/10/2022  ? Procedure: HOT HEMOSTASIS (ARGON PLASMA COAGULATION/BICAP);  Surgeon: Carol Ada, MD;  Location: [place];  Service: Endoscopy;  Laterality: N/A;  ? HOT HEMOSTASIS N/A 01/12/2022  ? Procedure: HOT HEMOSTASIS (ARGON PLASMA COAGULATION/BICAP);  Surgeon: Carol Ada, MD;  Location: [place] [facility];  Service: Endoscopy;  Laterality: N/A;  ? SPLENECTOMY    ? ?Family History  ?Problem Relation Age of Onset  ? Cancer Mother   ? Diabetes Mother   ? Cancer Father   ? Diabetes Father   ? Cancer Sister   ? Diabetes Sister   ? Cancer Brother   ? Brain cancer Brother   ? Diabetes Brother   ? Diabetes Maternal Grandmother   ? Diabetes Paternal Grandmother   ? Cancer - Lung Brother   ? Diabetes Brother   ? ?Social  History  ? ?Socioeconomic History  ? Marital status: Widowed  ?  Spouse name: Not on file  ? Number of children: 1  ? Years of education: 10  ? Highest education level: 12th grade  ?Occupational History  ? Not on file  ?Tobacco Use  ? Smoking status: Never  ?  Passive exposure: Never  ? Smokeless tobacco: Never  ?Vaping Use  ? Vaping Use: Never used  ?Substance and Sexual Activity  ? Alcohol use: No  ?  Alcohol/week: 0.0 standard drinks  ? Drug use: No  ? Sexual activity: Never  ?  Birth control/protection: Abstinence  ?Other Topics Concern  ? Not on file  ?Social History Narrative  ? Not on file  ? ?Social Determinants of Health  ? ?Financial Resource Strain: Low Risk   ? Difficulty of Paying Living Expenses: Not very hard  ?Food Insecurity: No Food Insecurity  ? Worried About Charity fundraiser in the Last Year: Never true  ? Ran Out of Food in the Last Year: Never true  ?Transportation Needs: No Transportation Needs  ? Lack of Transportation (Medical): No  ? Lack of Transportation (Non-Medical): No  ?Physical Activity: Inactive  ? Days of Exercise per  Week: 0 days  ? Minutes of Exercise per Session: 0 min  ?Stress: No Stress Concern Present  ? Feeling of Stress : Only a little  ?Social Connections: Moderately Isolated  ? Frequency of Communication with Fr

## 2022-03-15 ENCOUNTER — Ambulatory Visit: Payer: Medicare Other

## 2022-03-15 ENCOUNTER — Encounter: Payer: Self-pay | Admitting: Endocrinology

## 2022-03-15 ENCOUNTER — Other Ambulatory Visit: Payer: Self-pay

## 2022-03-15 ENCOUNTER — Ambulatory Visit (INDEPENDENT_AMBULATORY_CARE_PROVIDER_SITE_OTHER): Payer: Medicare Other | Admitting: Endocrinology

## 2022-03-15 DIAGNOSIS — E1165 Type 2 diabetes mellitus with hyperglycemia: Secondary | ICD-10-CM | POA: Diagnosis not present

## 2022-03-15 DIAGNOSIS — Z794 Long term (current) use of insulin: Secondary | ICD-10-CM

## 2022-03-15 NOTE — Progress Notes (Signed)
Patient ID: Katelyn Mahoney, female   DOB: 1934-08-12, 88 y.o.   MRN: [id number]  ? ?       ? ?Today's office visit was provided via telemedicine using a telephone call to the patient ?Patient has been explained the limitations of evaluation and management by telemedicine and the availability of in person appointments.  ?The patient understood the limitations and agreed to proceed. ?Patient also understood that the telehealth visit is billable. ? Location of the patient: Home ? Location of the provider: Office ?Only the patient and myself were participating in the encounter ? ? ?Reason for Appointment: Follow-up for Type 2 Diabetes ? ?Referring physician: Etter Sjogren ? ? ?History of Present Illness:  ?        ?Date of diagnosis of type 2 diabetes mellitus: ?  27       ? ?Background history:  ? ?She had previously been on metformin with fairly good control for some time ?She was also at some point treated with Janumet ?Apparently subsequently she could not tolerate metformin and had diarrhea ?This was switched to glipizide about 2 years ago and she is also being treated with [place] and [place] ?[place] was started in 9/17 ? ?Review of A1c indicates that her levels had been significantly high since 2016 ?She was initially seen in consultation on 02/27/17 ? ?Recent history:      ? ?Non-insulin hypoglycemic drugs  : Ozempic 1 mg weekly ? ?INSULIN regimen: ?Toujeo 25 units at supper, Humalog 9 units at lunch and supper and 7 units breakfast ? ?A1c is down to 6.2 compared to 7.4  ?However her hemoglobin recently is 8.0 ? ? ?Current management, blood sugar patterns and problems identified: ?Her glipizide was stopped previously but she is taking this again on her own ?Because of her blood sugars being lower after hospitalization in February she is taking less insulin for her basal ?Generally her blood sugars are fairly good in the morning with only a couple of high readings recently, she thinks this is from the way she is eating  the night before ?However she is generally avoiding sweets and a lot of fried food which she was doing before ?She has not checked her blood sugar after meals and only before breakfast and dinner ?She says she is adjusting her Humalog dose based on her Premeal blood sugar at breakfast and dinnertime but not at lunch ?Recently has had only 1 low blood sugar before dinnertime and likely from eating a very light lunch ?Recently appears to be losing some weight although overall her weight is about the same as on her last visit ? ? ?     Side effects from medications have been: Diarrhea from metformin ? ?Compliance with the medical regimen: Fair ? ?Glucose monitoring:  done 1 +  times a day         Glucometer: One Touch Verio ?      ?Blood Glucose readings by review of monitor: ? ? ?PRE-MEAL Fasting Lunch Dinner Bedtime Overall  ?Glucose range: 76-207  62-176    ?Mean/median:       ? ?POST-MEAL PC Breakfast PC Lunch PC Dinner  ?Glucose range:     ?Mean/median:     ? ? ? ?PRE-MEAL Fasting Lunch Dinner Bedtime Overall  ?Glucose range: 125-143      ?Mean/median:     133  ? ?POST-MEAL PC Breakfast PC Lunch PC Dinner  ?Glucose range:   106, 157  ?Mean/median:     ? ? ? ? ?  Self-care:  ?Meal times are:  Breakfast is at 9-10 AM ?Typical meal intake: Breakfast is frequently cheese toast.  Lunch is variable or nothing.  Meals are usually chicken, salad, fish, sometimes vegetables only.  Snacks: Peanuts, yogurt ?               ?Dietician visit, most recent:1/19  ? ? ?Weight history: Previous range 249-305 ? ?Wt Readings from Last 3 Encounters:  ?03/02/22 290 lb 9.1 oz (131.8 kg)  ?02/08/22 297 lb 12.8 oz (135.1 kg)  ?01/25/22 298 lb 4.5 oz (135.3 kg)  ? ? ?Glycemic control: ?  ?Lab Results  ?Component Value Date  ? HGBA1C 6.2 02/02/2022  ? HGBA1C 7.4 (H) 11/13/2021  ? HGBA1C 6.8 (H) 08/08/2021  ? ?Lab Results  ?Component Value Date  ? MICROALBUR 5.4 (H) 01/22/2022  ? [place] 60 02/02/2022  ? CREATININE 2.67 (H) 03/01/2022  ? ?Lab  Results  ?Component Value Date  ? MICRALBCREAT 15 01/22/2022  ? ?Lab Results  ?Component Value Date  ? FRUCTOSAMINE 301 (H) 09/19/2017  ? FRUCTOSAMINE 312 (H) 06/20/2017  ? FRUCTOSAMINE 339 (H) 03/27/2017  ? ? ? ? ? ?Allergies as of 03/15/2022   ? ?   Reactions  ? Bupropion Hcl Rash  ? Palmar rash  ? Penicillins Rash  ? rash  ? Citalopram Hydrobromide Other (See Comments)  ? cough  ? Metformin And Related Diarrhea  ? Blue Dyes (parenteral) Rash  ? Cobalt Rash  ? Gold-containing Drug Products Rash  ? Nickel Rash  ? Zinc Rash  ? ?  ? ?  ?Medication List  ?  ? ?  ? Accurate as of March 15, 2022  2:30 PM. If you have any questions, ask your nurse or doctor.  ?  ?  ? ?  ? ?acetaminophen 650 MG CR tablet ?Commonly known as: TYLENOL ?650 mg every 8 (eight) hours. ?  ?acetaZOLAMIDE 250 MG tablet ?Commonly known as: DIAMOX ?Take 1 tablet (250 mg total) by mouth 2 (two) times daily. ?  ?albuterol 108 (90 Base) MCG/ACT inhaler ?Commonly known as: VENTOLIN HFA ?Inhale 2 puffs into the lungs every 6 (six) hours as needed for wheezing or shortness of breath. ?  ?apixaban 2.5 MG Tabs tablet ?Commonly known as: ELIQUIS ?Take 1 tablet (2.5 mg total) by mouth 2 (two) times daily. ?  ?atorvastatin 20 MG tablet ?Commonly known as: LIPITOR ?TAKE 1/2 TABLET ON MONDAY, WEDNESDAY, FRIDAY AND      SUNDAYS ?What changed: See the new instructions. ?  ?BILBERRY PO ?Take 1 tablet by mouth daily. ?  ?diltiazem 120 MG 24 hr capsule ?Commonly known as: CARDIZEM CD ?Take 1 capsule (120 mg total) by mouth daily. ?  ?fluocinonide cream 0.05 % ?Commonly known as: LIDEX ?Apply 1 application topically daily as needed (breakouts). ?  ?gabapentin 100 MG capsule ?Commonly known as: NEURONTIN ?Take 1 capsule (100 mg total) by mouth 2 (two) times daily. ?  ?glipiZIDE 10 MG 24 hr tablet ?Commonly known as: GLUCOTROL XL ?TAKE 1 TABLET EVERY MORNING ?  ?GLUCOSAMINE PO ?Take 1 tablet by mouth daily. ?  ?insulin lispro 100 UNIT/ML KwikPen ?Commonly known as:  HumaLOG KwikPen ?Inject 6 Units into the skin with breakfast, with lunch, and with evening meal. Inject 7-9 units under the skin three times daily before meals. E11.65 ?  ?Insulin Pen Needle 32G X 4 MM Misc ?Use on insulin pens 4 times a day ?  ?ipratropium-albuterol 0.5-2.5 (3) MG/3ML Soln ?Commonly known as: DUONEB ?Take 3  mLs by nebulization every 6 (six) hours as needed. ?  ?iron polysaccharides 150 MG capsule ?Commonly known as: Nu-Iron ?Take 1 capsule (150 mg total) by mouth daily. ?  ?lactulose (encephalopathy) 10 GM/15ML Soln ?Commonly known as: [place] ?Take 15 mLs (10 g total) by mouth daily as needed. ?  ?Loratadine 10 MG Caps ?Take 10 mg by mouth daily. ?  ?metoprolol tartrate 25 MG tablet ?Commonly known as: LOPRESSOR ?Take 1 tablet (25 mg total) by mouth 2 (two) times daily. ?  ?multivitamin-lutein Caps capsule ?Take 1 capsule by mouth daily. ?  ?NONFORMULARY OR COMPOUNDED ITEM ?Lift chair  #1   Dx osteoarthritis knees ?  ?OneTouch Delica Lancets 10X Misc ?Use to test blood sugar once daily- E11.65 ?  ?OneTouch Verio test strip ?Generic drug: glucose blood ?USE TO TEST BLOOD SUGAR    TWICE A DAY ?  ?Ozempic (1 MG/DOSE) 4 MG/3ML Sopn ?Generic drug: Semaglutide (1 MG/DOSE) ?INJECT 1MG  SUBCUTANEOUSLY  WEEKLY (EVERY 7 DAYS) ?What changed: See the new instructions. ?  ?pantoprazole 40 MG tablet ?Commonly known as: PROTONIX ?Take 1 tablet (40 mg total) by mouth daily. ?  ?potassium chloride SA 20 MEQ tablet ?Commonly known as: KLOR-CON M ?Take 1 tablet (20 mEq total) by mouth daily. ?  ?sertraline 100 MG tablet ?Commonly known as: ZOLOFT ?TAKE 1 TABLET DAILY ?What changed:  ?how much to take ?how to take this ?when to take this ?additional instructions ?  ?sodium chloride 0.65 % Soln nasal spray ?Commonly known as: OCEAN ?Place 1 spray into both nostrils as needed for congestion. ?  ?Spiriva Respimat 2.5 MCG/ACT Aers ?Generic drug: Tiotropium Bromide Monohydrate ?Inhale 1 puff into the lungs daily. ?   ?Torsemide 60 MG Tabs ?Take 60 mg by mouth 2 (two) times daily. ?  ?Toujeo SoloStar 300 UNIT/ML Solostar Pen ?Generic drug: insulin glargine (1 Unit Dial) ?Inject 25 Units into the skin daily. ?  ? ?  ? ? ?All

## 2022-03-16 ENCOUNTER — Ambulatory Visit: Payer: Medicare Other | Admitting: Family Medicine

## 2022-03-19 ENCOUNTER — Ambulatory Visit: Payer: Medicare Other | Admitting: Cardiology

## 2022-03-22 ENCOUNTER — Telehealth: Payer: Self-pay | Admitting: Family Medicine

## 2022-03-26 NOTE — Telephone Encounter (Signed)
They finally got to find Dr. Etter Sjogren on the [facility] system, they want to make Dr. Etter Sjogren aware that is ready for her signature.  ?

## 2022-03-28 NOTE — Telephone Encounter (Signed)
Funeral home notified

## 2022-03-30 DIAGNOSIS — E1169 Type 2 diabetes mellitus with other specified complication: Secondary | ICD-10-CM

## 2022-03-30 DIAGNOSIS — I509 Heart failure, unspecified: Secondary | ICD-10-CM

## 2022-03-30 DIAGNOSIS — N1832 Chronic kidney disease, stage 3b: Secondary | ICD-10-CM

## 2022-03-30 DIAGNOSIS — E785 Hyperlipidemia, unspecified: Secondary | ICD-10-CM

## 2022-03-31 NOTE — Telephone Encounter (Signed)
FYI

## 2022-03-31 NOTE — Telephone Encounter (Signed)
Katelyn Mahoney and Katelyn Mahoney state pt has passed. They are trying to find Dr.Lowne in the [place] system to send the death certificate. Please advise.  ?

## 2022-03-31 NOTE — Telephone Encounter (Signed)
Returned call. Provider not coming up in their sxs after trying different name spellings. They advised they will need to contact the main branch and may need to bring it up here for her sig ?

## 2022-03-31 DEATH — deceased

## 2022-04-01 ENCOUNTER — Other Ambulatory Visit: Payer: Self-pay | Admitting: Endocrinology

## 2022-08-02 ENCOUNTER — Ambulatory Visit: Payer: Medicare Other | Admitting: Family Medicine

## 2023-03-03 IMAGING — CR DG CHEST 2V
2 series · 2 of 2 positions shown · non-contrast
Comparison: Chest x-ray dated March 11, 2009

CLINICAL DATA: Shortness of breath

EXAM:
CHEST - 2 VIEW

[chest lat]
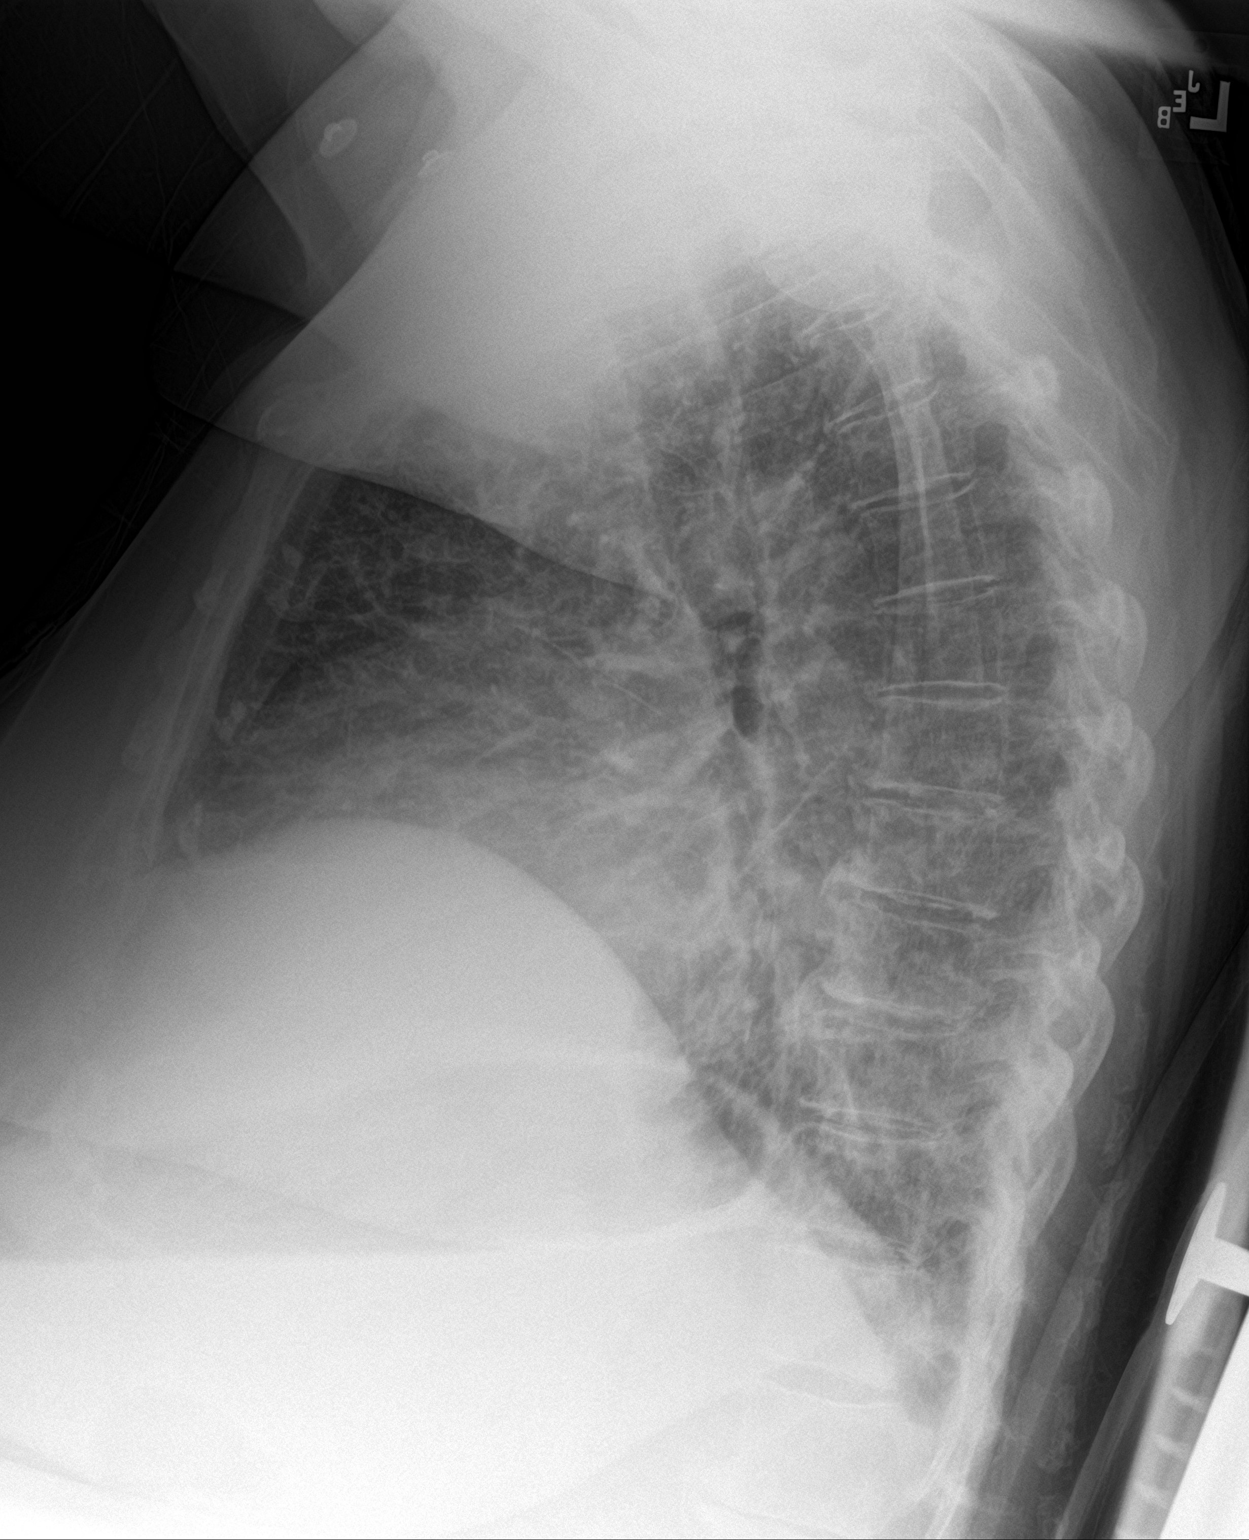

[chest ap]
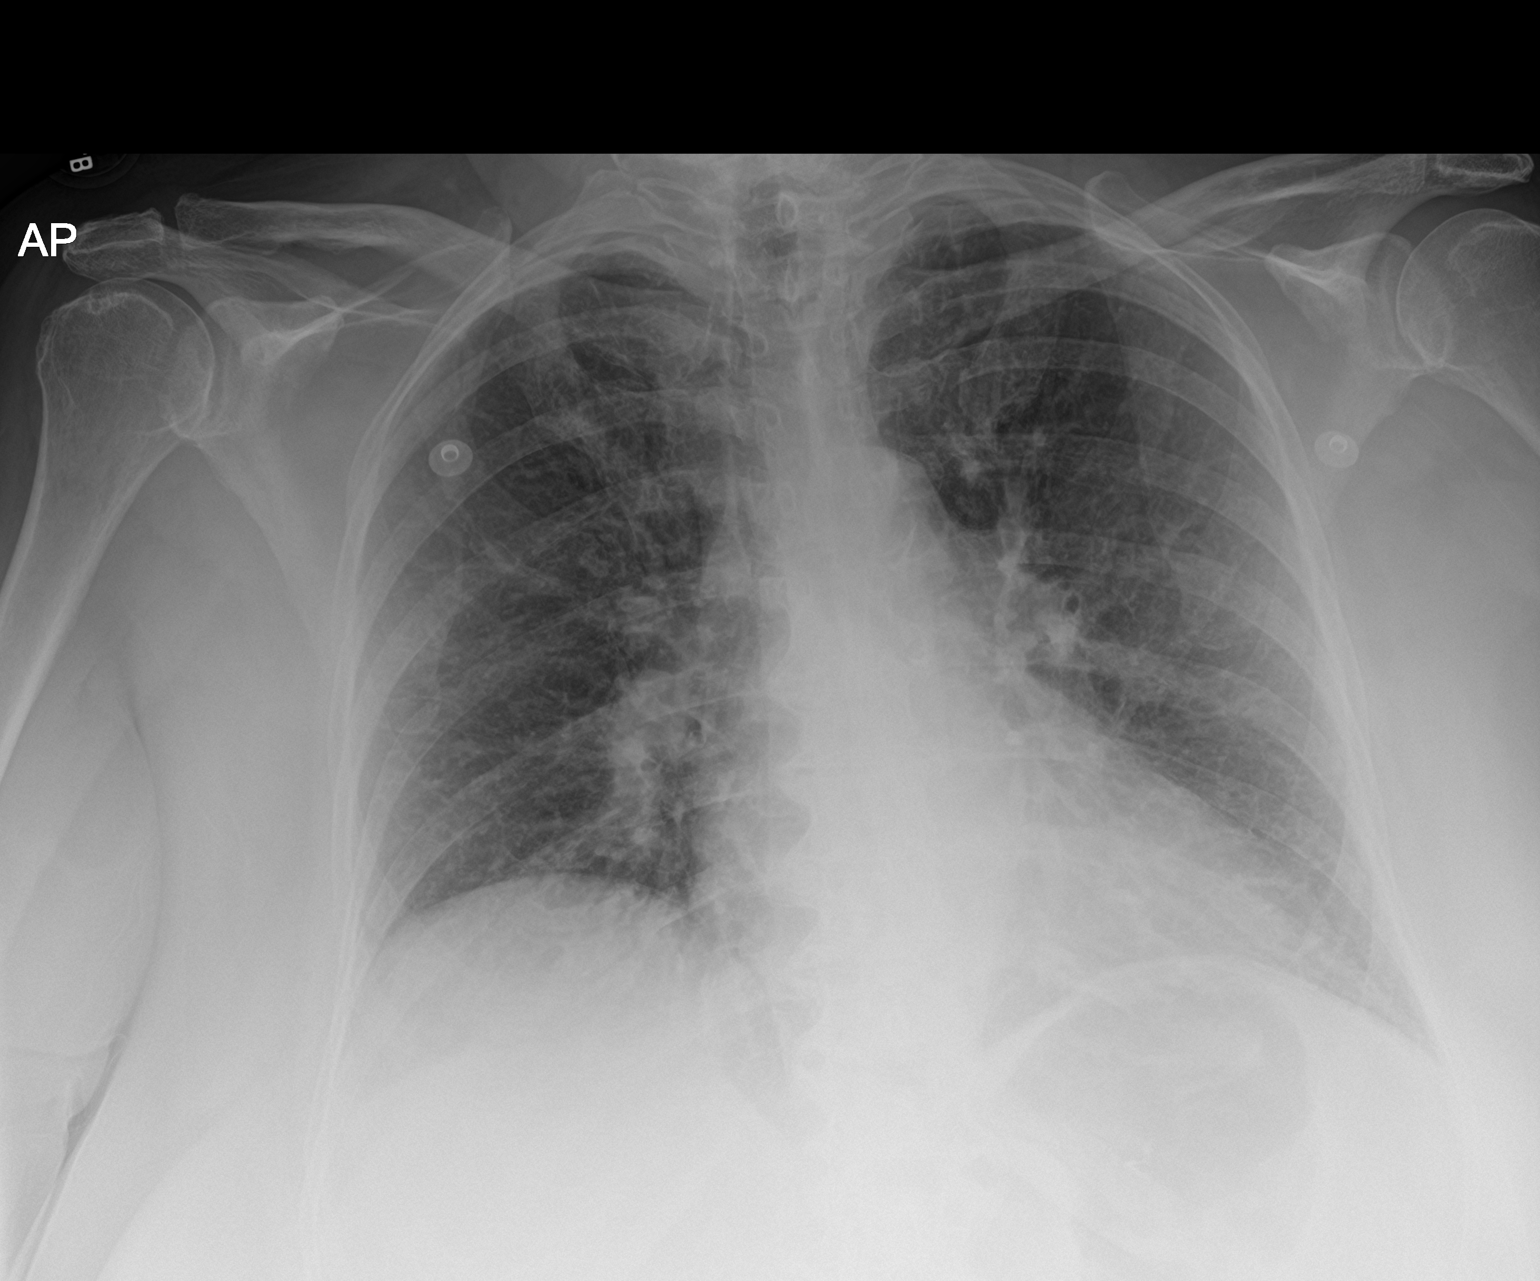

[2 of 2 positions shown; findings below may reference images not displayed]

FINDINGS: Cardiac and mediastinal contours are within normal limits. Mild
scattered linear opacities, likely due to atelectasis. No large
pleural effusion or pneumothorax.
IMPRESSION: No active cardiopulmonary disease.

## 2023-03-06 IMAGING — DX DG CHEST 1V PORT
1 series · 1 of 1 positions shown · non-contrast
Comparison: December 12, 2021.

CLINICAL DATA: Shortness of breath, fever.

EXAM:
PORTABLE CHEST 1 VIEW

[chest]
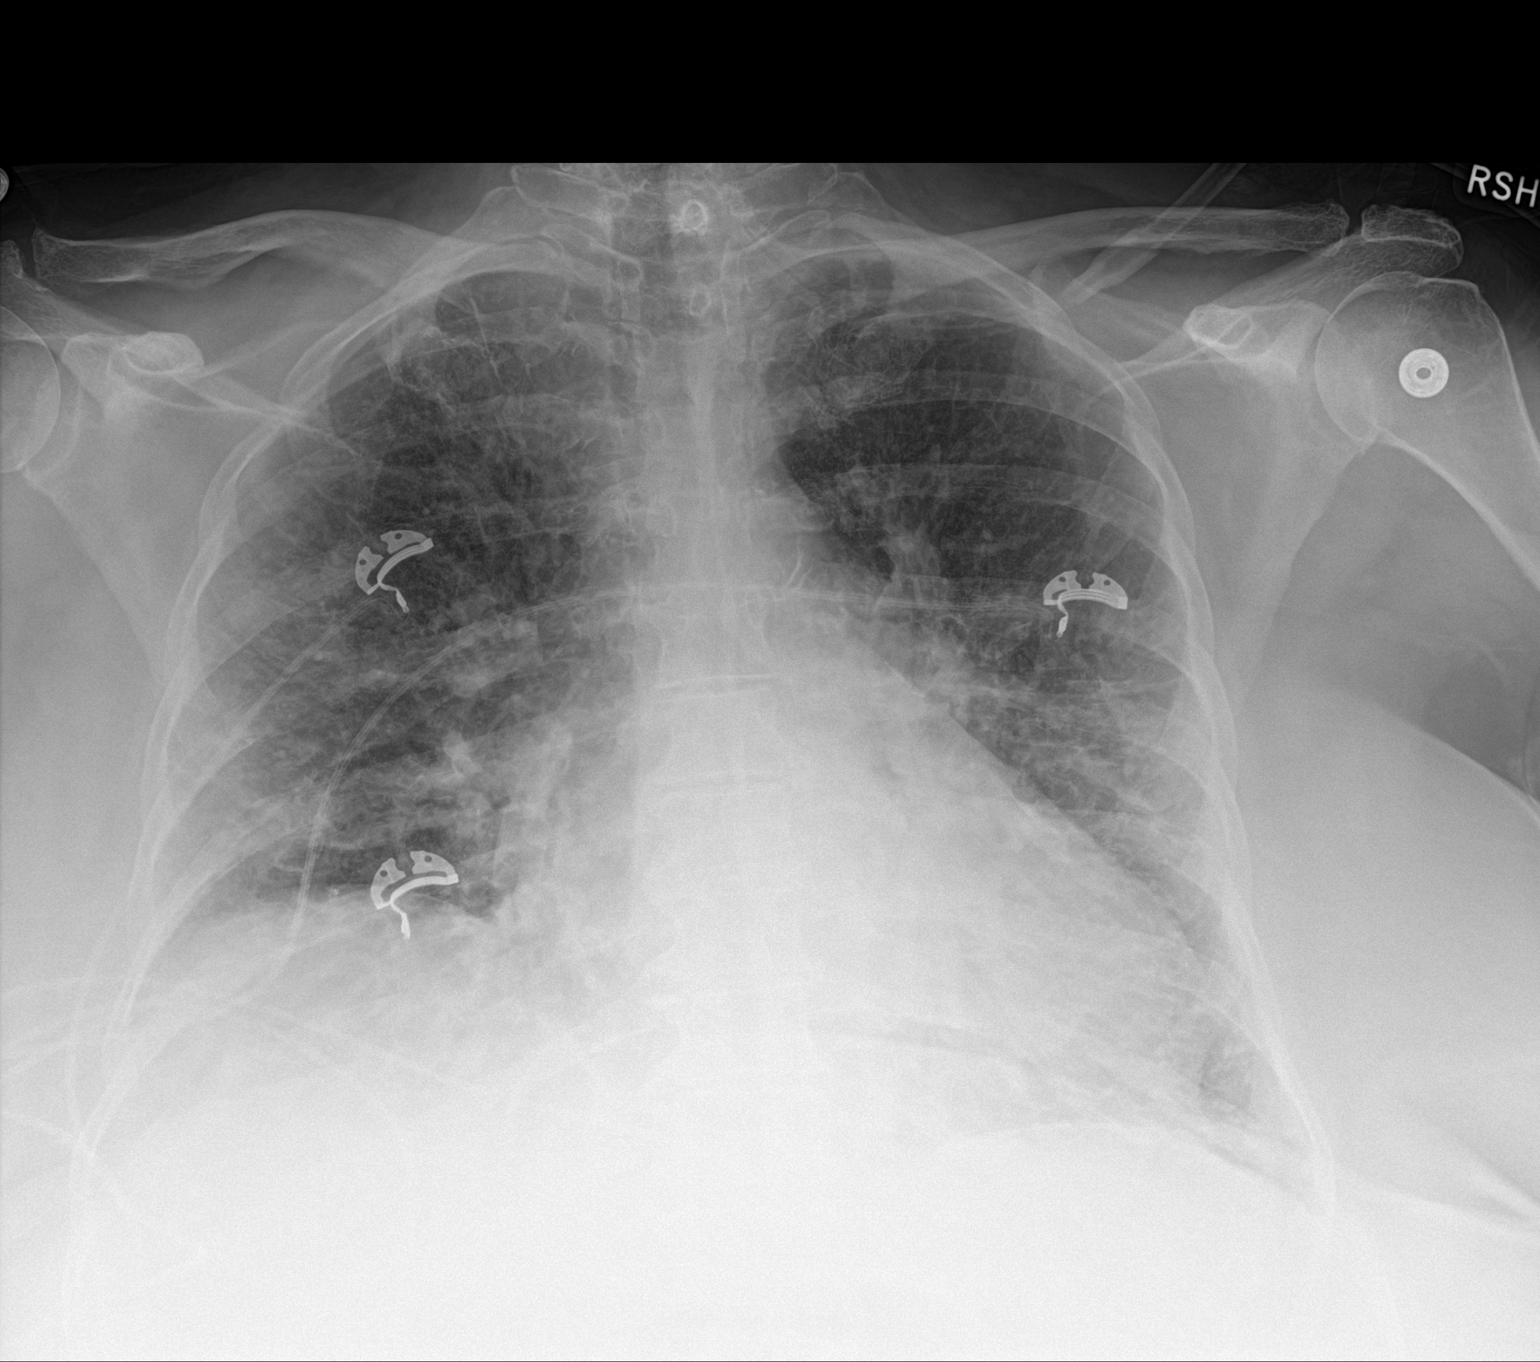

[1 of 1 positions shown; findings below may reference images not displayed]

FINDINGS: Stable cardiomediastinal silhouette. Mildly increased bilateral lung
opacities are noted concerning for edema or possibly multifocal
inflammation. Bony thorax is unremarkable.
IMPRESSION: Mildly increased bilateral lung opacities are noted concerning for
edema or possibly multifocal inflammation.

## 2023-03-07 IMAGING — DX DG CHEST 1V PORT
1 series · 1 of 1 positions shown · non-contrast
Comparison: None.

CLINICAL DATA: Dyspnea

EXAM:
PORTABLE CHEST 1 VIEW

[chest]
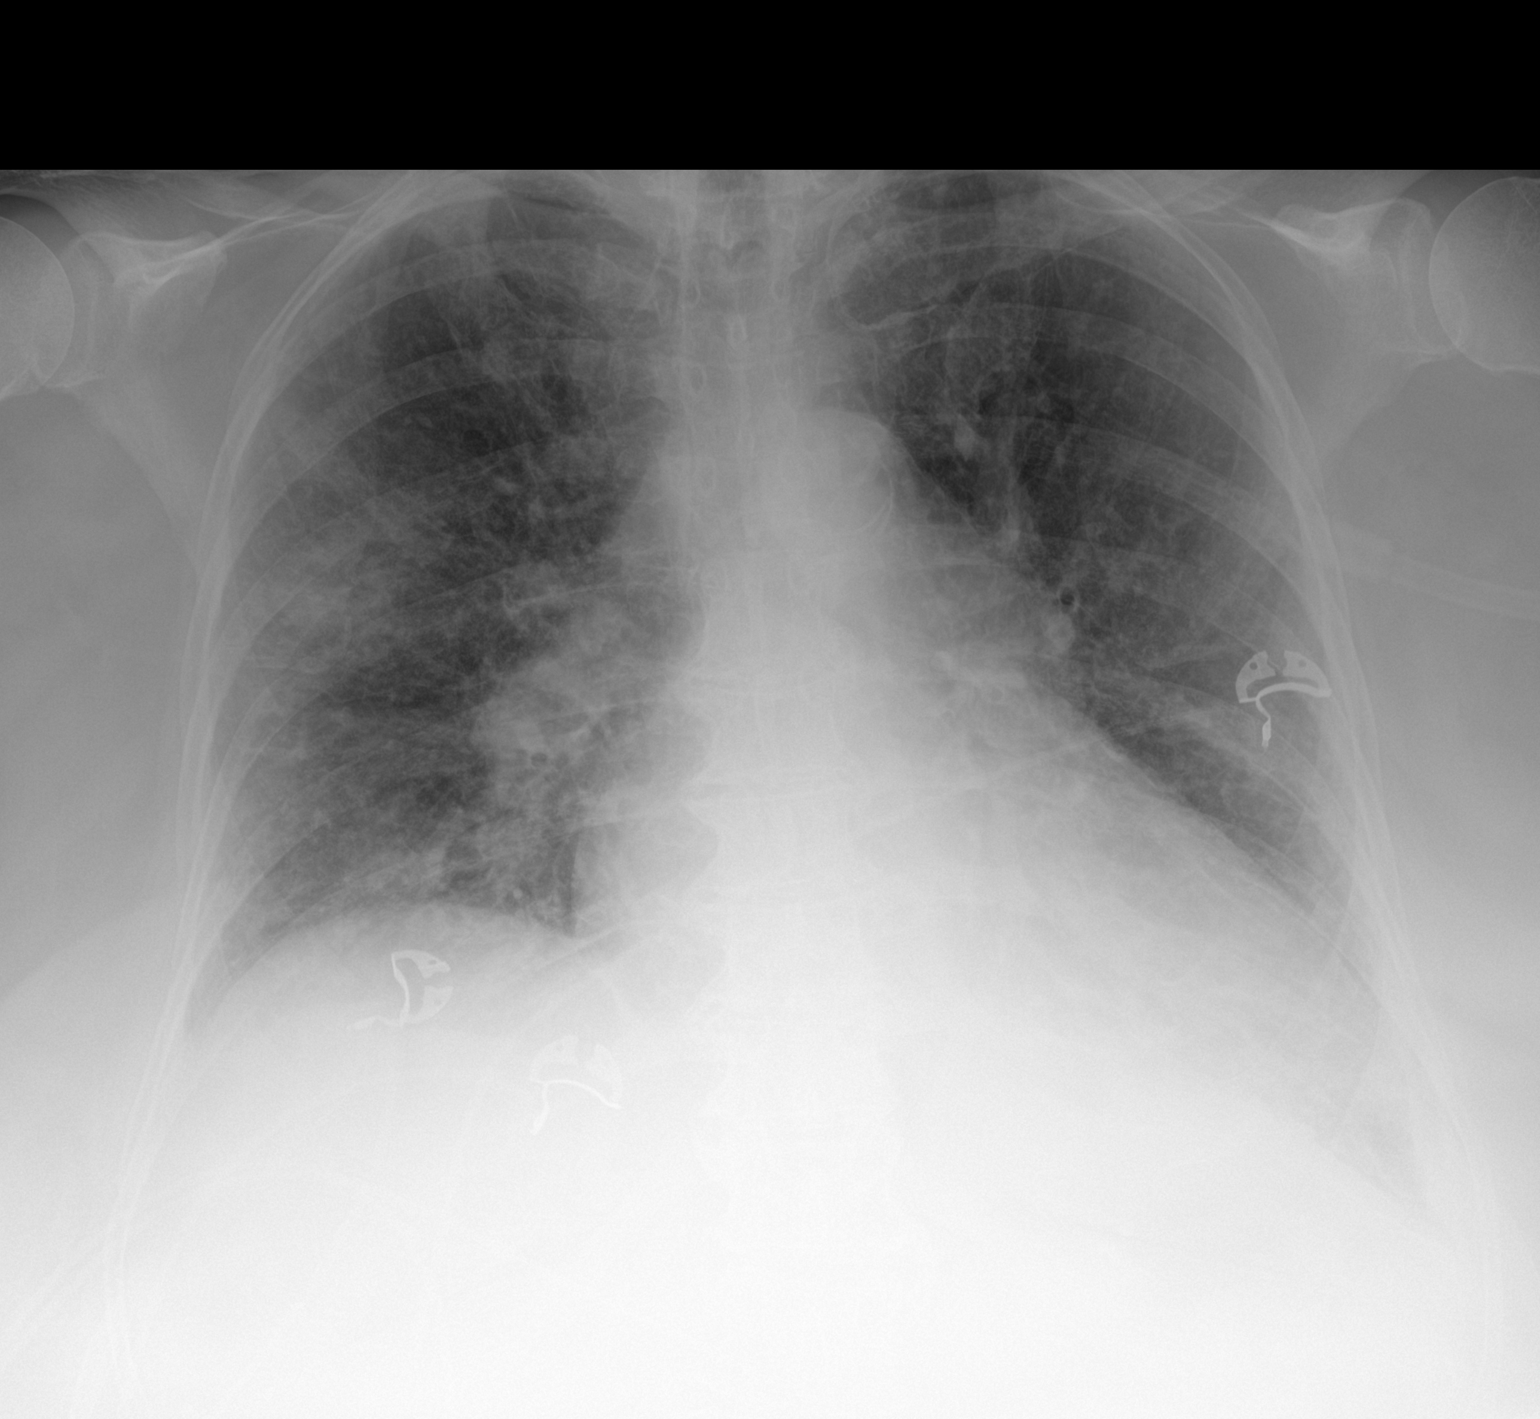

[1 of 1 positions shown; findings below may reference images not displayed]

FINDINGS: Cardiac shadow is enlarged. Aortic calcifications are noted. Lungs
are well aerated bilaterally with patchy airspace opacities slightly
increased on the right when compared with the prior exam. These are
slightly greater on the right than the left. No bony abnormality is
noted.
IMPRESSION: Increasing bilateral airspace opacities particularly on the right.
